# Patient Record
Sex: Female | Born: 1980
Health system: Southern US, Community
[De-identification: ages and names within clinical notes are randomized; demographics above are authoritative.]

## PROBLEM LIST (undated history)

## (undated) DIAGNOSIS — R002 Palpitations: Secondary | ICD-10-CM

## (undated) DIAGNOSIS — IMO0002 Reserved for concepts with insufficient information to code with codable children: Secondary | ICD-10-CM

## (undated) DIAGNOSIS — E039 Hypothyroidism, unspecified: Secondary | ICD-10-CM

## (undated) DIAGNOSIS — D649 Anemia, unspecified: Secondary | ICD-10-CM

## (undated) DIAGNOSIS — J302 Other seasonal allergic rhinitis: Secondary | ICD-10-CM

## (undated) DIAGNOSIS — E079 Disorder of thyroid, unspecified: Secondary | ICD-10-CM

## (undated) DIAGNOSIS — F4310 Post-traumatic stress disorder, unspecified: Secondary | ICD-10-CM

## (undated) DIAGNOSIS — F32A Depression, unspecified: Secondary | ICD-10-CM

## (undated) DIAGNOSIS — T7840XA Allergy, unspecified, initial encounter: Secondary | ICD-10-CM

## (undated) DIAGNOSIS — F319 Bipolar disorder, unspecified: Secondary | ICD-10-CM

## (undated) DIAGNOSIS — R87619 Unspecified abnormal cytological findings in specimens from cervix uteri: Secondary | ICD-10-CM

## (undated) DIAGNOSIS — F329 Major depressive disorder, single episode, unspecified: Secondary | ICD-10-CM

## (undated) HISTORY — DX: Anemia, unspecified: D64.9

## (undated) HISTORY — DX: Allergy, unspecified, initial encounter: T78.40XA

## (undated) HISTORY — DX: Post-traumatic stress disorder, unspecified: F43.10

## (undated) HISTORY — DX: Reserved for concepts with insufficient information to code with codable children: IMO0002

## (undated) HISTORY — DX: Unspecified abnormal cytological findings in specimens from cervix uteri: R87.619

## (undated) HISTORY — DX: Other seasonal allergic rhinitis: J30.2

## (undated) HISTORY — DX: Depression, unspecified: F32.A

## (undated) HISTORY — DX: Bipolar disorder, unspecified: F31.9

## (undated) HISTORY — DX: Major depressive disorder, single episode, unspecified: F32.9

## (undated) HISTORY — DX: Disorder of thyroid, unspecified: E07.9

## (undated) HISTORY — DX: Palpitations: R00.2

---
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
## 2002-01-31 ENCOUNTER — Encounter: Admission: RE | Admit: 2002-01-31 | Discharge: 2002-01-31 | Payer: Self-pay | Admitting: Family Medicine

## 2002-02-07 ENCOUNTER — Encounter: Admission: RE | Admit: 2002-02-07 | Discharge: 2002-02-07 | Payer: Self-pay | Admitting: Family Medicine

## 2002-06-22 ENCOUNTER — Encounter: Admission: RE | Admit: 2002-06-22 | Discharge: 2002-06-22 | Payer: Self-pay | Admitting: Family Medicine

## 2002-07-26 ENCOUNTER — Encounter: Admission: RE | Admit: 2002-07-26 | Discharge: 2002-07-26 | Payer: Self-pay | Admitting: Family Medicine

## 2002-12-04 ENCOUNTER — Encounter: Payer: Self-pay | Admitting: Emergency Medicine

## 2002-12-04 ENCOUNTER — Emergency Department (HOSPITAL_COMMUNITY): Admission: EM | Admit: 2002-12-04 | Discharge: 2002-12-04 | Payer: Self-pay | Admitting: Emergency Medicine

## 2002-12-19 ENCOUNTER — Emergency Department (HOSPITAL_COMMUNITY): Admission: EM | Admit: 2002-12-19 | Discharge: 2002-12-19 | Payer: Self-pay | Admitting: Emergency Medicine

## 2002-12-22 ENCOUNTER — Encounter: Payer: Self-pay | Admitting: Emergency Medicine

## 2002-12-22 ENCOUNTER — Emergency Department (HOSPITAL_COMMUNITY): Admission: EM | Admit: 2002-12-22 | Discharge: 2002-12-22 | Payer: Self-pay | Admitting: Emergency Medicine

## 2002-12-28 ENCOUNTER — Encounter: Admission: RE | Admit: 2002-12-28 | Discharge: 2002-12-28 | Payer: Self-pay | Admitting: Family Medicine

## 2003-01-01 ENCOUNTER — Encounter: Payer: Self-pay | Admitting: Sports Medicine

## 2003-01-01 ENCOUNTER — Encounter: Admission: RE | Admit: 2003-01-01 | Discharge: 2003-01-01 | Payer: Self-pay | Admitting: Sports Medicine

## 2003-02-07 ENCOUNTER — Emergency Department (HOSPITAL_COMMUNITY): Admission: EM | Admit: 2003-02-07 | Discharge: 2003-02-07 | Payer: Self-pay | Admitting: Emergency Medicine

## 2003-02-20 ENCOUNTER — Encounter: Admission: RE | Admit: 2003-02-20 | Discharge: 2003-02-20 | Payer: Self-pay | Admitting: Family Medicine

## 2003-03-07 ENCOUNTER — Encounter (INDEPENDENT_AMBULATORY_CARE_PROVIDER_SITE_OTHER): Payer: Self-pay | Admitting: Specialist

## 2003-03-07 ENCOUNTER — Encounter: Admission: RE | Admit: 2003-03-07 | Discharge: 2003-03-07 | Payer: Self-pay | Admitting: Family Medicine

## 2003-06-26 ENCOUNTER — Encounter: Admission: RE | Admit: 2003-06-26 | Discharge: 2003-06-26 | Payer: Self-pay | Admitting: Family Medicine

## 2003-07-09 ENCOUNTER — Encounter: Admission: RE | Admit: 2003-07-09 | Discharge: 2003-07-09 | Payer: Self-pay | Admitting: Family Medicine

## 2003-08-02 ENCOUNTER — Encounter: Admission: RE | Admit: 2003-08-02 | Discharge: 2003-08-02 | Payer: Self-pay | Admitting: Family Medicine

## 2003-08-16 ENCOUNTER — Encounter: Admission: RE | Admit: 2003-08-16 | Discharge: 2003-08-16 | Payer: Self-pay | Admitting: Family Medicine

## 2003-09-13 ENCOUNTER — Encounter: Admission: RE | Admit: 2003-09-13 | Discharge: 2003-09-13 | Payer: Self-pay | Admitting: Family Medicine

## 2003-11-13 ENCOUNTER — Encounter: Admission: RE | Admit: 2003-11-13 | Discharge: 2003-11-13 | Payer: Self-pay | Admitting: Family Medicine

## 2004-03-24 ENCOUNTER — Ambulatory Visit: Payer: Self-pay | Admitting: Family Medicine

## 2004-04-03 ENCOUNTER — Ambulatory Visit: Payer: Self-pay | Admitting: Family Medicine

## 2004-04-03 ENCOUNTER — Other Ambulatory Visit: Admission: RE | Admit: 2004-04-03 | Discharge: 2004-04-03 | Payer: Self-pay | Admitting: Family Medicine

## 2004-04-24 ENCOUNTER — Ambulatory Visit: Payer: Self-pay | Admitting: Family Medicine

## 2004-05-13 ENCOUNTER — Ambulatory Visit: Payer: Self-pay | Admitting: Family Medicine

## 2004-05-22 ENCOUNTER — Ambulatory Visit: Payer: Self-pay | Admitting: Sports Medicine

## 2004-06-12 ENCOUNTER — Ambulatory Visit: Payer: Self-pay | Admitting: Family Medicine

## 2004-07-09 ENCOUNTER — Ambulatory Visit: Payer: Self-pay | Admitting: Family Medicine

## 2004-08-12 ENCOUNTER — Emergency Department (HOSPITAL_COMMUNITY): Admission: EM | Admit: 2004-08-12 | Discharge: 2004-08-12 | Payer: Self-pay | Admitting: Emergency Medicine

## 2004-09-01 ENCOUNTER — Ambulatory Visit: Payer: Self-pay | Admitting: Family Medicine

## 2004-09-09 HISTORY — PX: DILATION AND CURETTAGE OF UTERUS: SHX78

## 2004-09-10 ENCOUNTER — Ambulatory Visit: Payer: Self-pay | Admitting: Family Medicine

## 2004-09-10 ENCOUNTER — Ambulatory Visit (HOSPITAL_COMMUNITY): Admission: RE | Admit: 2004-09-10 | Discharge: 2004-09-10 | Payer: Self-pay | Admitting: Family Medicine

## 2004-09-21 ENCOUNTER — Ambulatory Visit: Payer: Self-pay | Admitting: Family Medicine

## 2004-10-30 ENCOUNTER — Ambulatory Visit: Payer: Self-pay | Admitting: Family Medicine

## 2004-11-11 ENCOUNTER — Ambulatory Visit: Payer: Self-pay | Admitting: Family Medicine

## 2004-12-11 ENCOUNTER — Ambulatory Visit: Payer: Self-pay | Admitting: Family Medicine

## 2005-02-20 ENCOUNTER — Emergency Department (HOSPITAL_COMMUNITY): Admission: EM | Admit: 2005-02-20 | Discharge: 2005-02-20 | Payer: Self-pay | Admitting: Emergency Medicine

## 2005-02-23 ENCOUNTER — Ambulatory Visit: Payer: Self-pay | Admitting: Family Medicine

## 2005-03-12 ENCOUNTER — Encounter (INDEPENDENT_AMBULATORY_CARE_PROVIDER_SITE_OTHER): Payer: Self-pay | Admitting: Specialist

## 2005-03-12 ENCOUNTER — Ambulatory Visit: Payer: Self-pay | Admitting: Family Medicine

## 2005-04-01 ENCOUNTER — Ambulatory Visit: Payer: Self-pay | Admitting: Family Medicine

## 2005-04-13 ENCOUNTER — Ambulatory Visit: Payer: Self-pay | Admitting: Family Medicine

## 2005-05-17 ENCOUNTER — Emergency Department (HOSPITAL_COMMUNITY): Admission: EM | Admit: 2005-05-17 | Discharge: 2005-05-17 | Payer: Self-pay | Admitting: Family Medicine

## 2005-07-23 ENCOUNTER — Ambulatory Visit: Payer: Self-pay | Admitting: Sports Medicine

## 2005-07-26 ENCOUNTER — Ambulatory Visit (HOSPITAL_COMMUNITY): Admission: RE | Admit: 2005-07-26 | Discharge: 2005-07-26 | Payer: Self-pay | Admitting: Family Medicine

## 2005-08-13 ENCOUNTER — Emergency Department (HOSPITAL_COMMUNITY): Admission: EM | Admit: 2005-08-13 | Discharge: 2005-08-13 | Payer: Self-pay | Admitting: Emergency Medicine

## 2005-09-04 ENCOUNTER — Emergency Department (HOSPITAL_COMMUNITY): Admission: EM | Admit: 2005-09-04 | Discharge: 2005-09-04 | Payer: Self-pay | Admitting: Family Medicine

## 2005-09-15 ENCOUNTER — Ambulatory Visit: Payer: Self-pay | Admitting: Family Medicine

## 2005-12-29 ENCOUNTER — Ambulatory Visit: Payer: Self-pay | Admitting: Family Medicine

## 2006-01-09 ENCOUNTER — Encounter (INDEPENDENT_AMBULATORY_CARE_PROVIDER_SITE_OTHER): Payer: Self-pay | Admitting: *Deleted

## 2006-01-09 LAB — CONVERTED CEMR LAB

## 2006-01-24 ENCOUNTER — Other Ambulatory Visit: Admission: RE | Admit: 2006-01-24 | Discharge: 2006-01-24 | Payer: Self-pay | Admitting: Family Medicine

## 2006-01-24 ENCOUNTER — Encounter (INDEPENDENT_AMBULATORY_CARE_PROVIDER_SITE_OTHER): Payer: Self-pay | Admitting: Specialist

## 2006-01-24 ENCOUNTER — Ambulatory Visit: Payer: Self-pay | Admitting: Family Medicine

## 2006-02-03 ENCOUNTER — Ambulatory Visit: Payer: Self-pay | Admitting: Sports Medicine

## 2006-02-25 ENCOUNTER — Ambulatory Visit: Payer: Self-pay | Admitting: Family Medicine

## 2006-09-08 DIAGNOSIS — L2089 Other atopic dermatitis: Secondary | ICD-10-CM

## 2006-09-08 DIAGNOSIS — J45909 Unspecified asthma, uncomplicated: Secondary | ICD-10-CM

## 2006-09-08 DIAGNOSIS — J309 Allergic rhinitis, unspecified: Secondary | ICD-10-CM | POA: Insufficient documentation

## 2006-09-08 DIAGNOSIS — E669 Obesity, unspecified: Secondary | ICD-10-CM

## 2006-09-09 ENCOUNTER — Encounter (INDEPENDENT_AMBULATORY_CARE_PROVIDER_SITE_OTHER): Payer: Self-pay | Admitting: *Deleted

## 2007-01-16 ENCOUNTER — Telehealth: Payer: Self-pay | Admitting: *Deleted

## 2007-01-17 ENCOUNTER — Telehealth: Payer: Self-pay | Admitting: *Deleted

## 2007-07-13 DIAGNOSIS — E079 Disorder of thyroid, unspecified: Secondary | ICD-10-CM

## 2007-07-13 HISTORY — DX: Disorder of thyroid, unspecified: E07.9

## 2007-08-31 ENCOUNTER — Telehealth: Payer: Self-pay | Admitting: *Deleted

## 2007-09-07 ENCOUNTER — Ambulatory Visit: Payer: Self-pay | Admitting: Family Medicine

## 2007-09-07 LAB — CONVERTED CEMR LAB: Beta hcg, urine, semiquantitative: NEGATIVE

## 2007-10-02 ENCOUNTER — Encounter: Payer: Self-pay | Admitting: *Deleted

## 2007-10-09 ENCOUNTER — Ambulatory Visit: Payer: Self-pay | Admitting: Family Medicine

## 2007-10-30 ENCOUNTER — Telehealth: Payer: Self-pay | Admitting: *Deleted

## 2007-10-30 ENCOUNTER — Emergency Department (HOSPITAL_COMMUNITY): Admission: EM | Admit: 2007-10-30 | Discharge: 2007-10-31 | Payer: Self-pay | Admitting: Emergency Medicine

## 2007-10-31 ENCOUNTER — Encounter: Payer: Self-pay | Admitting: *Deleted

## 2007-11-23 ENCOUNTER — Ambulatory Visit: Payer: Self-pay | Admitting: Family Medicine

## 2007-12-06 ENCOUNTER — Telehealth: Payer: Self-pay | Admitting: *Deleted

## 2007-12-07 ENCOUNTER — Ambulatory Visit: Payer: Self-pay | Admitting: Family Medicine

## 2007-12-07 LAB — CONVERTED CEMR LAB
Blood in Urine, dipstick: NEGATIVE
Ketones, urine, test strip: NEGATIVE
Urobilinogen, UA: 0.2
WBC Urine, dipstick: NEGATIVE

## 2007-12-21 ENCOUNTER — Telehealth (INDEPENDENT_AMBULATORY_CARE_PROVIDER_SITE_OTHER): Payer: Self-pay | Admitting: Family Medicine

## 2008-02-22 ENCOUNTER — Encounter (INDEPENDENT_AMBULATORY_CARE_PROVIDER_SITE_OTHER): Payer: Self-pay | Admitting: Family Medicine

## 2008-02-23 ENCOUNTER — Encounter: Payer: Self-pay | Admitting: *Deleted

## 2008-02-25 ENCOUNTER — Emergency Department (HOSPITAL_COMMUNITY): Admission: EM | Admit: 2008-02-25 | Discharge: 2008-02-25 | Payer: Self-pay | Admitting: Emergency Medicine

## 2008-02-26 ENCOUNTER — Telehealth: Payer: Self-pay | Admitting: *Deleted

## 2008-03-01 ENCOUNTER — Emergency Department (HOSPITAL_COMMUNITY): Admission: EM | Admit: 2008-03-01 | Discharge: 2008-03-01 | Payer: Self-pay | Admitting: Emergency Medicine

## 2008-03-03 ENCOUNTER — Telehealth: Payer: Self-pay | Admitting: Family Medicine

## 2008-03-07 ENCOUNTER — Encounter (INDEPENDENT_AMBULATORY_CARE_PROVIDER_SITE_OTHER): Payer: Self-pay | Admitting: Family Medicine

## 2008-03-12 ENCOUNTER — Encounter (INDEPENDENT_AMBULATORY_CARE_PROVIDER_SITE_OTHER): Payer: Self-pay | Admitting: Family Medicine

## 2008-03-12 ENCOUNTER — Ambulatory Visit: Payer: Self-pay | Admitting: Family Medicine

## 2008-03-12 DIAGNOSIS — R002 Palpitations: Secondary | ICD-10-CM | POA: Insufficient documentation

## 2008-03-12 DIAGNOSIS — E0590 Thyrotoxicosis, unspecified without thyrotoxic crisis or storm: Secondary | ICD-10-CM | POA: Insufficient documentation

## 2008-03-12 HISTORY — DX: Palpitations: R00.2

## 2008-03-13 ENCOUNTER — Telehealth (INDEPENDENT_AMBULATORY_CARE_PROVIDER_SITE_OTHER): Payer: Self-pay | Admitting: Family Medicine

## 2008-03-13 ENCOUNTER — Encounter (INDEPENDENT_AMBULATORY_CARE_PROVIDER_SITE_OTHER): Payer: Self-pay | Admitting: Family Medicine

## 2008-03-13 LAB — CONVERTED CEMR LAB
GC Probe Amp, Genital: NEGATIVE
Hepatitis B Surface Ag: NEGATIVE

## 2008-03-14 ENCOUNTER — Emergency Department (HOSPITAL_COMMUNITY): Admission: EM | Admit: 2008-03-14 | Discharge: 2008-03-15 | Payer: Self-pay | Admitting: Emergency Medicine

## 2008-03-15 ENCOUNTER — Encounter (INDEPENDENT_AMBULATORY_CARE_PROVIDER_SITE_OTHER): Payer: Self-pay | Admitting: Family Medicine

## 2008-03-19 ENCOUNTER — Ambulatory Visit: Payer: Self-pay | Admitting: Family Medicine

## 2008-03-20 ENCOUNTER — Encounter (INDEPENDENT_AMBULATORY_CARE_PROVIDER_SITE_OTHER): Payer: Self-pay | Admitting: Family Medicine

## 2008-03-21 ENCOUNTER — Encounter: Payer: Self-pay | Admitting: *Deleted

## 2008-03-25 ENCOUNTER — Encounter: Payer: Self-pay | Admitting: *Deleted

## 2008-03-27 ENCOUNTER — Ambulatory Visit: Payer: Self-pay | Admitting: Family Medicine

## 2008-03-28 ENCOUNTER — Encounter (HOSPITAL_COMMUNITY): Admission: RE | Admit: 2008-03-28 | Discharge: 2008-06-19 | Payer: Self-pay | Admitting: Family Medicine

## 2008-04-01 ENCOUNTER — Telehealth (INDEPENDENT_AMBULATORY_CARE_PROVIDER_SITE_OTHER): Payer: Self-pay | Admitting: Family Medicine

## 2008-04-04 ENCOUNTER — Telehealth (INDEPENDENT_AMBULATORY_CARE_PROVIDER_SITE_OTHER): Payer: Self-pay | Admitting: Family Medicine

## 2008-04-05 ENCOUNTER — Telehealth: Payer: Self-pay | Admitting: *Deleted

## 2008-04-06 ENCOUNTER — Telehealth (INDEPENDENT_AMBULATORY_CARE_PROVIDER_SITE_OTHER): Payer: Self-pay | Admitting: Family Medicine

## 2008-04-06 ENCOUNTER — Encounter: Payer: Self-pay | Admitting: Family Medicine

## 2008-04-06 ENCOUNTER — Inpatient Hospital Stay (HOSPITAL_COMMUNITY): Admission: EM | Admit: 2008-04-06 | Discharge: 2008-04-07 | Payer: Self-pay | Admitting: Emergency Medicine

## 2008-04-09 ENCOUNTER — Telehealth: Payer: Self-pay | Admitting: *Deleted

## 2008-04-15 ENCOUNTER — Encounter: Payer: Self-pay | Admitting: *Deleted

## 2008-04-29 ENCOUNTER — Telehealth (INDEPENDENT_AMBULATORY_CARE_PROVIDER_SITE_OTHER): Payer: Self-pay | Admitting: *Deleted

## 2008-05-02 ENCOUNTER — Ambulatory Visit: Payer: Self-pay | Admitting: Family Medicine

## 2008-05-02 DIAGNOSIS — T783XXA Angioneurotic edema, initial encounter: Secondary | ICD-10-CM | POA: Insufficient documentation

## 2008-05-20 ENCOUNTER — Telehealth (INDEPENDENT_AMBULATORY_CARE_PROVIDER_SITE_OTHER): Payer: Self-pay | Admitting: *Deleted

## 2008-05-23 ENCOUNTER — Emergency Department (HOSPITAL_COMMUNITY): Admission: EM | Admit: 2008-05-23 | Discharge: 2008-05-23 | Payer: Self-pay | Admitting: Emergency Medicine

## 2008-05-28 ENCOUNTER — Telehealth (INDEPENDENT_AMBULATORY_CARE_PROVIDER_SITE_OTHER): Payer: Self-pay | Admitting: Family Medicine

## 2008-06-21 ENCOUNTER — Encounter (INDEPENDENT_AMBULATORY_CARE_PROVIDER_SITE_OTHER): Payer: Self-pay | Admitting: Family Medicine

## 2008-06-25 ENCOUNTER — Encounter (INDEPENDENT_AMBULATORY_CARE_PROVIDER_SITE_OTHER): Payer: Self-pay | Admitting: *Deleted

## 2008-06-25 ENCOUNTER — Ambulatory Visit: Payer: Self-pay | Admitting: Family Medicine

## 2008-06-25 ENCOUNTER — Encounter (INDEPENDENT_AMBULATORY_CARE_PROVIDER_SITE_OTHER): Payer: Self-pay | Admitting: Family Medicine

## 2008-06-26 LAB — CONVERTED CEMR LAB
T3, Free: 7.3 pg/mL — ABNORMAL HIGH (ref 2.3–4.2)
TSH: <0.004 microintl units/mL — ABNORMAL LOW (ref 0.350–4.50)

## 2008-06-27 ENCOUNTER — Ambulatory Visit: Payer: Self-pay | Admitting: Family Medicine

## 2008-07-09 ENCOUNTER — Ambulatory Visit: Payer: Self-pay | Admitting: Family Medicine

## 2008-07-30 ENCOUNTER — Ambulatory Visit: Payer: Self-pay | Admitting: Family Medicine

## 2008-07-30 ENCOUNTER — Encounter (INDEPENDENT_AMBULATORY_CARE_PROVIDER_SITE_OTHER): Payer: Self-pay | Admitting: Family Medicine

## 2008-08-17 ENCOUNTER — Emergency Department (HOSPITAL_COMMUNITY): Admission: EM | Admit: 2008-08-17 | Discharge: 2008-08-17 | Payer: Self-pay | Admitting: Emergency Medicine

## 2008-09-11 ENCOUNTER — Telehealth (INDEPENDENT_AMBULATORY_CARE_PROVIDER_SITE_OTHER): Payer: Self-pay | Admitting: Family Medicine

## 2008-09-20 ENCOUNTER — Ambulatory Visit: Payer: Self-pay | Admitting: Family Medicine

## 2008-09-20 ENCOUNTER — Encounter (INDEPENDENT_AMBULATORY_CARE_PROVIDER_SITE_OTHER): Payer: Self-pay | Admitting: Family Medicine

## 2008-09-23 ENCOUNTER — Telehealth (INDEPENDENT_AMBULATORY_CARE_PROVIDER_SITE_OTHER): Payer: Self-pay | Admitting: Family Medicine

## 2008-09-23 LAB — CONVERTED CEMR LAB
Free T4: 1.35 ng/dL (ref 0.89–1.80)
T3, Free: 4.7 pg/mL — ABNORMAL HIGH (ref 2.3–4.2)

## 2008-11-23 ENCOUNTER — Emergency Department (HOSPITAL_COMMUNITY): Admission: EM | Admit: 2008-11-23 | Discharge: 2008-11-23 | Payer: Self-pay | Admitting: Family Medicine

## 2008-12-02 ENCOUNTER — Encounter (INDEPENDENT_AMBULATORY_CARE_PROVIDER_SITE_OTHER): Payer: Self-pay | Admitting: Family Medicine

## 2008-12-30 ENCOUNTER — Ambulatory Visit: Payer: Self-pay | Admitting: Family Medicine

## 2008-12-30 LAB — CONVERTED CEMR LAB: Beta hcg, urine, semiquantitative: NEGATIVE

## 2009-01-01 ENCOUNTER — Telehealth: Payer: Self-pay | Admitting: Sports Medicine

## 2009-01-02 ENCOUNTER — Ambulatory Visit: Payer: Self-pay | Admitting: Family Medicine

## 2009-01-02 ENCOUNTER — Encounter: Payer: Self-pay | Admitting: Sports Medicine

## 2009-01-02 LAB — CONVERTED CEMR LAB
Bilirubin Urine: NEGATIVE
Glucose, Urine, Semiquant: NEGATIVE
Ketones, urine, test strip: NEGATIVE
Nitrite: NEGATIVE
Protein, U semiquant: NEGATIVE
Specific Gravity, Urine: 1.02
Urobilinogen, UA: 0.2
pH: 5.5

## 2009-01-03 ENCOUNTER — Encounter: Payer: Self-pay | Admitting: Sports Medicine

## 2009-01-05 ENCOUNTER — Telehealth: Payer: Self-pay | Admitting: Family Medicine

## 2009-01-05 ENCOUNTER — Telehealth: Payer: Self-pay | Admitting: Sports Medicine

## 2009-01-17 ENCOUNTER — Ambulatory Visit: Payer: Self-pay | Admitting: Family Medicine

## 2009-01-17 ENCOUNTER — Encounter: Payer: Self-pay | Admitting: Family Medicine

## 2009-01-17 ENCOUNTER — Telehealth: Payer: Self-pay | Admitting: Sports Medicine

## 2009-01-17 LAB — CONVERTED CEMR LAB
Beta hcg, urine, semiquantitative: NEGATIVE
Bilirubin Urine: NEGATIVE
Blood in Urine, dipstick: NEGATIVE
Ketones, urine, test strip: NEGATIVE
WBC Urine, dipstick: NEGATIVE
pH: 8

## 2009-01-20 LAB — CONVERTED CEMR LAB
Chlamydia, DNA Probe: NEGATIVE
Free T4: 0.95 ng/dL (ref 0.80–1.80)
T3, Free: 3.2 pg/mL (ref 2.3–4.2)

## 2009-07-29 ENCOUNTER — Encounter (INDEPENDENT_AMBULATORY_CARE_PROVIDER_SITE_OTHER): Payer: Self-pay | Admitting: *Deleted

## 2009-11-26 ENCOUNTER — Emergency Department (HOSPITAL_COMMUNITY): Admission: EM | Admit: 2009-11-26 | Discharge: 2009-11-26 | Payer: Self-pay | Admitting: Emergency Medicine

## 2010-02-03 ENCOUNTER — Encounter (HOSPITAL_COMMUNITY): Admission: RE | Admit: 2010-02-03 | Discharge: 2010-04-01 | Payer: Self-pay | Admitting: Endocrinology

## 2010-03-06 ENCOUNTER — Ambulatory Visit (HOSPITAL_COMMUNITY): Admission: RE | Admit: 2010-03-06 | Discharge: 2010-03-06 | Payer: Self-pay | Admitting: Endocrinology

## 2010-05-04 ENCOUNTER — Encounter: Payer: Self-pay | Admitting: Sports Medicine

## 2010-05-18 ENCOUNTER — Encounter: Payer: Self-pay | Admitting: Sports Medicine

## 2010-06-12 ENCOUNTER — Emergency Department (HOSPITAL_COMMUNITY)
Admission: EM | Admit: 2010-06-12 | Discharge: 2010-06-13 | Payer: Self-pay | Source: Home / Self Care | Admitting: Emergency Medicine

## 2010-06-23 ENCOUNTER — Emergency Department (HOSPITAL_COMMUNITY)
Admission: EM | Admit: 2010-06-23 | Discharge: 2010-06-23 | Payer: Self-pay | Source: Home / Self Care | Admitting: Emergency Medicine

## 2010-08-11 NOTE — Progress Notes (Signed)
 Summary: Triage  Phone Note Call from Patient Call back at Work Phone [phone]   Caller: Patient Summary of Call: pt has uti becuase she can't get off work can she be seen tomorrow. Initial call taken by: Madelin Daring,  January 01, 2009 8:52 AM  Follow-up for Phone Call        appt made for tomorrow in workin. to drinkk plenty of water & take tylenol . she agreed with plan Follow-up by: Ginnie Mau RN,  January 01, 2009 8:57 AM  Additional Follow-up for Phone Call Additional follow up Details #1::        Have her come see me at 3:30 tomorrow as I have clinic, ok to double book.  Make sure she has a Urinalysis and Urine culture done when she gets here so that can be running while she's waiting for me to see her.  I will put the orders into the computer. Additional Follow-up by: Debby Petties MD,  January 01, 2009 11:29 AM  New Problems: DYSURIA (ICD-788.1)   New Problems: DYSURIA (ICD-788.1)

## 2010-08-11 NOTE — Progress Notes (Signed)
 Summary: request/ts  Phone Note Call from Patient Call back at Home Phone [phone]   Caller: Patient Summary of Call: pt needs dr note for work that she was seen today.     Initial call taken by: Madelin Daring,  January 17, 2009 1:59 PM  Follow-up for Phone Call        CALLED PT. NOTE UP FRONT. Follow-up by: Jack Bloodgood CMA,,  January 17, 2009 4:23 PM  Additional Follow-up for Phone Call Additional follow up Details #1::        pt request for PCP to write a letter stating, when she was diagnosed with Graves Disease. fwd. to Dr.Navarre Diana Additional Follow-up by: Jack Bloodgood CMA,,  January 17, 2009 4:27 PM    Additional Follow-up for Phone Call Additional follow up Details #2::    Was Dx 03/13/08 Follow-up by: Debby Petties MD,  January 17, 2009 5:38 PM    Appended Document: request/ts CALLED PT AND LMAM TO PICK UP STATEMENT.

## 2010-08-11 NOTE — Miscellaneous (Signed)
   Clinical Lists Changes  Problems: Changed problem from ASTHMA, UNSPECIFIED (ICD-493.90) to ASTHMA, INTERMITTENT (ICD-493.90) 

## 2010-08-11 NOTE — Letter (Signed)
 Summary: Generic Letter  [facility]  [address]   [place], [place] [address]   Phone: [phone]  Fax: [phone]    01/02/2009  Colleen Peck [address] [place], [place]  [address]  To whom it may concern,  Colleen Peck was seen in my office for a medical complaint today 01/02/09.  Please excuse her for any absenses she may have acquired.  Thank you and feel free to contact my office with any questions.     Sincerely,    Debby Petties MD

## 2010-08-11 NOTE — Progress Notes (Signed)
  Phone Note Outgoing Call Call back at [facility] Phone [phone]   Call placed by: Debby Petties MD,  January 05, 2009 7:03 PM Call placed to: Patient Action Taken: Information Sent Reason for Call: Discuss lab or test results Summary of Call: Pt seen for UTI 6/24, Rx Keflex , UCx subsequently grew out ACINETOBACTER CALCOACETICUS-BAUMANNII COMPLEX (see Urine Culture result in chart) resistant to Keflex , called pt and left message that she needs to pick up new antibiotic from [facility] pharmacy.  Cipro  500 by mouth two times a day x7d.     New/Updated Medications: CIPROFLOXACIN  HCL 500 MG TABS (CIPROFLOXACIN  HCL) One tab by mouth two times a day x7 days   Prescriptions: CIPROFLOXACIN  HCL 500 MG TABS (CIPROFLOXACIN  HCL) One tab by mouth two times a day x7 days  #14 x 0   Entered and Authorized by:   Debby Petties MD   Signed by:   Debby Petties MD on 01/05/2009   Method used:   Electronically to        [facility] [phone]* (retail)       [address]       [place], [place]  [address]       Ph: [phone]       Fax: [phone]   RxID:   [id number]

## 2010-08-11 NOTE — Miscellaneous (Signed)
Summary: MMR  Clinical Lists Changes states she needs proof of 2nd mmr. we do not have it in our records. she does not remember having it as a kid. told her we usually draw blood to check to see if immune. states her insurance will not pay for labs that are sent out-only if done here. says if it is cheaper, she wants the 2nd mmr vaccine. to [name] to answer cost question.Golden Circle RN  July 29, 2009 3:39 PM  Told pt cost of treatment costs and suggested [facility], stated she had already talked to them and would go there since it is free.Gladstone Pih  July 31, 2009 3:18 PM
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
## 2010-08-11 NOTE — Miscellaneous (Signed)
  Clinical Lists Changes  Problems: Removed problem of CONTACT OR EXPOSURE TO OTHER VIRAL DISEASES (ICD-V01.79) Removed problem of ORAL CONTRACEPTION (ICD-V25.41) Removed problem of NONSPEC REACT TUBERCULIN SKN TEST W/O ACTV TB (ICD-795.5) Removed problem of EXOPHTHALMOS (ICD-376.30) Removed problem of PRURITUS (ICD-698.9)

## 2010-09-18 ENCOUNTER — Inpatient Hospital Stay (INDEPENDENT_AMBULATORY_CARE_PROVIDER_SITE_OTHER)
Admission: RE | Admit: 2010-09-18 | Discharge: 2010-09-18 | Disposition: A | Discharge status: Home / Self Care | Payer: PRIVATE HEALTH INSURANCE | Source: Ambulatory Visit | Attending: Family Medicine | Admitting: Family Medicine

## 2010-09-18 DIAGNOSIS — K047 Periapical abscess without sinus: Secondary | ICD-10-CM

## 2010-10-14 ENCOUNTER — Other Ambulatory Visit: Payer: Self-pay | Admitting: Obstetrics & Gynecology

## 2010-11-24 NOTE — Discharge Summary (Signed)
Colleen Peck, Colleen Peck             ACCOUNT NO.:  [id number]   MEDICAL RECORD NO.:  [id number]          PATIENT TYPE:  INP   LOCATION:  4714                         FACILITY:  [facility]   PHYSICIAN:  [name], M.D.DATE OF BIRTH:  05/24/1981   DATE OF ADMISSION:  04/06/2008  DATE OF DISCHARGE:  04/07/2008                               DISCHARGE SUMMARY   DISCHARGE DIAGNOSES:  1. Shortness of breath.  2. Hypoxia.  3. Asthma.  4. Graves disease.   CONSULTS:  None.   PROCEDURE AND STUDY:  1. CT angiogram, impression; no gross central pulmonary emboli      identified.  No significant abnormalities.  Questionable residual      sinus.  2. Chest x-ray, April 06, 2008, impression; normal chest.   DISCHARGE LABORATORY DATA:  D-dimer 0.79.  Cardiac enzymes negative x1.  Hemoglobin 12.0 and hematocrit 36.4.   BRIEF HISTORY AND PHYSICAL:  A 30 year old female recently diagnosed  with Graves disease presents with a 1-week history of URI symptoms and  progressive shortness of breath.   HOSPITAL COURSE:  1. Shortness of breath.  The patient presented with increased work of      breathing without wheeze after being seen by Urgent Care.  The      patient was found to be saturating greater than 94%, however, ABG      did show an oxygenation of 69.  It was thought that because the      patient's saturations were able to be maintained even with low      oxygen on ABG that this must be a chronic problem for the patient.      The patient was admitted for shortness of breath and placed on      telemetry.  The patient has a history of asthma; however, there was      no wheezing on exam.  The patient was given albuterol nebulizers      for symptomatic relief from increased work of breathing, which did      give some relief to the patient.  Shortness of breath could      possible be from 1-week history of viral URI versus reactive airway      disease. The patient had positive D-dimer and CT  angiogram ruled      out evidence of pulmonary embolism as cause of acute shortness of      breath.  There may have been some component of anxiety to shortness      of breath as well as the patient recently diagnosed with Graves      disease and has been dealing with sequelae of disease including      urticaria.  The patient's shortness of breath improved prior to      discharge, was maintaining saturations greater than 93% on room air      with normal work of breathing.   1. Asthma.  The patient has a history of asthma and was given      albuterol nebulizer treatment as stated above.  The patient was      discharged home on  albuterol nebulizer q.4 h p.r.n. as well as      albuterol inhaler if nebulizers were not used.   1. Graves disease.  The patient was recently diagnosed with Graves      disease and was continued on propranolol 40 mg daily.  However,      prior to discharge, propranolol dose was found to be inadequate and      was increased to 40 mg t.i.d.  The patient was also started on      methimazole 10 mg b.i.d. as the patient had not started as an      outpatient prior to admission to hospital.  The patient will follow      up PCP regarding Graves disease.  It is of note that the patient      has had a history of urticaria/hives with new onset of Graves      disease.  She was previously on Vistaril 25 mg 1-2 tablets p.o.      t.i.d. as needed for itching.  The patient did have episode of      urticaria during admission and was given Vistaril for symptomatic      relief.   FOLLOWUP:  The patient is to follow up with Dr. [name] at [facility]   ISSUES FOR FOLLOWUP:  None.   DISCHARGE CONDITION:  Stable/improved.   DISCHARGE MEDICATIONS:  1. Albuterol 2.5 mg nebulizers q.4 h p.r.n. shortness of breath.  2. Propranolol 40 mg 1 tablet p.o. t.i.d.  3. Vistaril 25 mg 1-2 tablets p.o. t.i.d. p.r.n. pruritus.  4. Methimazole 10 mg 1 tablet p.o.  b.i.d.   The patient is instructed to return for evaluation if she has increased  work of breathing or chest pain.      Milinda Antis, MD  Electronically Signed      Santiago Bumpers. Leveda Anna, M.D.  Electronically Signed    KD/MEDQ  D:  04/07/2008  T:  04/08/2008  Job:  [id number]   cc:   [name], M.D.

## 2010-11-24 NOTE — H&P (Signed)
Colleen Peck, Colleen Peck             ACCOUNT NO.:  [id number]   MEDICAL RECORD NO.:  [id number]          PATIENT TYPE:  INP   LOCATION:  4714                         FACILITY:  [facility]   PHYSICIAN:  [name], M.D.DATE OF BIRTH:  07/01/1981   DATE OF ADMISSION:  04/06/2008  DATE OF DISCHARGE:                              HISTORY & PHYSICAL   CHIEF COMPLAINT:  Shortness of breath.   PRIMARY CARE PHYSICIAN:  [name], MD at the [facility].   HISTORY OF PRESENT ILLNESS:  A 30 year old female newly diagnosed with  Graves disease presents to ED with 1 week history of URI symptoms.  The  patient has had rhinorrhea x1 week, which progressed with productive  cough, nasal congestion, and shortness of breath.  Denies fever.  Last  night shortness of breath worsened despite home nebulizer treatment.  Tried OTC Tamiflu for symptoms as well with no relief.  Has had history  of asthma; however, per the patient did not feel like this was a normal  asthma attack.  Was evaluated by Urgent Care and told to come the ED for  admission for tachypnea and tachycardia.  Upon ED arrival, found to be  tachycardic and tachypneic, given albuterol/Atrovent nebulizers x2,  given 1 dose of Solu-Medrol.  Of note, the patient recently diagnosed  with Graves, has not started methimazole yet.   REVIEW OF SYSTEMS:  Denies chest pain, abdominal pain.  Admits to  posttussive emesis, 20-pound weight loss over the past 2-3 months.  Positive sick contact.  Denies immobilization.  Denies OCP.  No family  history of blood clots.   PAST MEDICAL HISTORY:  1. Hyperthyroidism/Graves disease.  2. Asthma.  3. Eczema.  4. Obesity.  5. Allergic rhinitis.   FAMILY HISTORY:  Aunt with breast cancer in late 52s.  Maternal  grandfather with a history of liver cancer, hypertension, and diabetes  in the family.  Mother has asthma.  Extended family with thyroid  problems.   SOCIAL HISTORY:  The  patient is a Theatre stage manager at [employer].  Quit tobacco 3 months ago.  At that time, also quit  marijuana.  Occasional EtOH.  Denies any other illicit drugs.  Sexually  active.  Lives with mother.   MEDICATIONS:  1. Propranolol 40 mg p.o. daily.  2. Albuterol nebulizers q.4 h. P.r.n.  3. Vistaril 25 mg p.o. q.6 h. p.r.n. pruritus.  4. Methimazole 10 mg p.o. b.i.d.   CURRENT ALLERGIES:  PENICILLIN questionable allergy, which may cause  rash.   PHYSICAL EXAMINATION:  VITAL SIGNS:  Temperature 97.7, pulse 133, blood  pressure 116/71, respiratory rate 24, O2 sat 94% on room air.  GENERAL:  Well developed, well nourished in no acute distress.  Alert  and oriented x3.  Able to speak in full sentences.  HEENT:  PERRL.  Extraocular muscles intact.  Vision grossly normal with  glasses, proptosis.  Moist mucous membranes.  No thyromegaly.  No  thyroid nodules.  CVS:  Regular rate and rhythm.  No murmurs, rubs, or gallops.  LUNGS:  Increased work of breathing, quick  shallow breaths.  Chest  expands symmetrically.  Lungs are clear to auscultation.  No wheezes or  rales.  ABDOMEN:  Positive bowel sounds.  Abdomen is soft and nontender without  masses or organomegaly.  EXTREMITIES:  Pulses 2+, no cyanosis or edema.  NEUROLOGIC:  No cranial nerve deficits.  Sensory and motor grossly  intact.   LABORATORY DATA:  I-STAT BMET, sodium 138, potassium 4.6, chloride 103,  CO2 of 30, BUN less than 3, creatinine 0.6, glucose 103.  CBC,  hemoglobin 12.0, white blood cell count 7.8, hematocrit 36.4, platelets  375.  Cardiac enzymes negative x1.  D-dimer elevated at 0.79.  BNP 58.  ABG, pH is 7.44, pO2 is 69, pCO2 is 37.9, bicarb of 27.   IMAGES:  Chest x-ray, no acute pathology.  CT angiogram, no evidence of  central PE.   IMPRESSION AND RECOMMENDATIONS:  A 30 year old female with history of  Graves disease admitted with shortness of breath and hypoxia.  1. Dyspnea/shortness of  breath.  The patient with acute onset of      dyspnea and decreased oxygen on ABG.  We will admit to telemetry to      monitor.  Differentials include asthma exacerbation, viral upper      respiratory illness, deep venous thrombosis/pulmonary embolism,      anxiety, or sequela of Graves disease.  The patient has had history      of asthma, however, clinical exam does not suggest asthma      exacerbations.  We will continue nebulizer treatments if they      provide symptomatic relief.  If the patient decompensates or begins      to wheeze, we will start corticosteroids and antibiotics as needed.      Viral upper respiratory illness.  The patient afebrile without      leukocytosis, recent upper respiratory illness, dyspnea could be      related to viral process.  Pulmonary embolism.  Positive D-dimer,      however, CT angiogram does not show acute pulmonary embolism.  No      further anticoagulation or workup needed for pulmonary embolism.      We will give supplemental oxygen overnight and as needed.  Repeat      CBC, BMET in a.m.  We will give Mucinex for cough.  2. Tachycardia.  The patient's tachycardia possibly due to sequela of      Graves disease, anxiety with shortness of breath, or from repeated      nebulizer treatments.  We will continue to monitor.  3. Hyperthyroidism, newly diagnosed Graves disease.  We will continue      home medications.  Start methimazole during inpatient stay.  We      will give Vistaril for pruritus p.r.n.  4. Asthma.  We will continue albuterol and Atrovent nebulizer      treatments.  5. FEN/GI.  Regular diet.  Hep-Lock IV fluids.  6.Deep venous thrombosis prophylaxis.  Lovenox 40 mg subcu.  Dispo: pending improvement in respiratory status      Milinda Antis, MD  Electronically Signed      Santiago Bumpers. Leveda Anna, M.D.  Electronically Signed    KD/MEDQ  D:  04/07/2008  T:  04/07/2008  Job:  [id number]

## 2011-04-06 ENCOUNTER — Emergency Department (HOSPITAL_COMMUNITY)
Admission: EM | Admit: 2011-04-06 | Discharge: 2011-04-06 | Disposition: A | Discharge status: Home / Self Care | Payer: PRIVATE HEALTH INSURANCE | Attending: Emergency Medicine | Admitting: Emergency Medicine

## 2011-04-06 LAB — CBC
Hemoglobin: 10.5 — ABNORMAL LOW
MCHC: 33.1
RBC: 4.07
WBC: 5.5

## 2011-04-06 LAB — DIFFERENTIAL
Basophils Relative: 1
Lymphocytes Relative: 39
Lymphs Abs: 2.1
Monocytes Absolute: 0.6
Monocytes Relative: 11
Neutro Abs: 2.5
Neutrophils Relative %: 45

## 2011-04-06 LAB — CARBOXYHEMOGLOBIN
Carboxyhemoglobin: 0.7
Methemoglobin: 0.8
O2 Saturation: 45.4

## 2011-04-07 ENCOUNTER — Emergency Department (HOSPITAL_COMMUNITY)
Admission: EM | Admit: 2011-04-07 | Discharge: 2011-04-08 | Disposition: A | Discharge status: Home / Self Care | Payer: Self-pay | Attending: Emergency Medicine | Admitting: Emergency Medicine

## 2011-04-07 DIAGNOSIS — R5383 Other fatigue: Secondary | ICD-10-CM | POA: Insufficient documentation

## 2011-04-07 DIAGNOSIS — J3489 Other specified disorders of nose and nasal sinuses: Secondary | ICD-10-CM | POA: Insufficient documentation

## 2011-04-07 DIAGNOSIS — R630 Anorexia: Secondary | ICD-10-CM | POA: Insufficient documentation

## 2011-04-07 DIAGNOSIS — R05 Cough: Secondary | ICD-10-CM | POA: Insufficient documentation

## 2011-04-07 DIAGNOSIS — E039 Hypothyroidism, unspecified: Secondary | ICD-10-CM | POA: Insufficient documentation

## 2011-04-07 DIAGNOSIS — Z79899 Other long term (current) drug therapy: Secondary | ICD-10-CM | POA: Insufficient documentation

## 2011-04-07 DIAGNOSIS — R5381 Other malaise: Secondary | ICD-10-CM | POA: Insufficient documentation

## 2011-04-07 DIAGNOSIS — R059 Cough, unspecified: Secondary | ICD-10-CM | POA: Insufficient documentation

## 2011-04-07 DIAGNOSIS — E0500 Thyrotoxicosis with diffuse goiter without thyrotoxic crisis or storm: Secondary | ICD-10-CM | POA: Insufficient documentation

## 2011-04-07 DIAGNOSIS — R61 Generalized hyperhidrosis: Secondary | ICD-10-CM | POA: Insufficient documentation

## 2011-04-07 DIAGNOSIS — G4700 Insomnia, unspecified: Secondary | ICD-10-CM | POA: Insufficient documentation

## 2011-04-08 ENCOUNTER — Emergency Department (HOSPITAL_COMMUNITY): Payer: Self-pay

## 2011-04-08 ENCOUNTER — Emergency Department (HOSPITAL_COMMUNITY)
Admission: EM | Admit: 2011-04-08 | Discharge: 2011-04-08 | Disposition: A | Discharge status: Home / Self Care | Payer: Self-pay | Attending: Emergency Medicine | Admitting: Emergency Medicine

## 2011-04-08 DIAGNOSIS — R059 Cough, unspecified: Secondary | ICD-10-CM | POA: Insufficient documentation

## 2011-04-08 DIAGNOSIS — R443 Hallucinations, unspecified: Secondary | ICD-10-CM | POA: Insufficient documentation

## 2011-04-08 DIAGNOSIS — E039 Hypothyroidism, unspecified: Secondary | ICD-10-CM | POA: Insufficient documentation

## 2011-04-08 DIAGNOSIS — R05 Cough: Secondary | ICD-10-CM | POA: Insufficient documentation

## 2011-04-08 DIAGNOSIS — R0602 Shortness of breath: Secondary | ICD-10-CM | POA: Insufficient documentation

## 2011-04-08 LAB — COMPREHENSIVE METABOLIC PANEL
Alkaline Phosphatase: 54 U/L (ref 39–117)
BUN: 8 mg/dL (ref 6–23)
CO2: 28 mEq/L (ref 19–32)
Chloride: 98 mEq/L (ref 96–112)
Creatinine, Ser: 0.79 mg/dL (ref 0.50–1.10)
GFR calc Af Amer: >60 mL/min (ref >60)
GFR calc non Af Amer: >60 mL/min (ref >60)
Glucose, Bld: 85 mg/dL (ref 70–99)
Potassium: 3.6 mEq/L (ref 3.5–5.1)
Total Bilirubin: 0.3 mg/dL (ref 0.3–1.2)

## 2011-04-08 LAB — RAPID URINE DRUG SCREEN, HOSP PERFORMED
Amphetamines: NOT DETECTED
Opiates: NOT DETECTED

## 2011-04-08 LAB — POCT I-STAT, CHEM 8
BUN: 7 mg/dL (ref 6–23)
Calcium, Ion: 1.21 mmol/L (ref 1.12–1.32)
Chloride: 99 mEq/L (ref 96–112)
Creatinine, Ser: 1 mg/dL (ref 0.50–1.10)
Sodium: 138 mEq/L (ref 135–145)
TCO2: 27 mmol/L (ref 0–100)

## 2011-04-08 LAB — CBC
HCT: 32.2 % — ABNORMAL LOW (ref 36.0–46.0)
Hemoglobin: 10.8 g/dL — ABNORMAL LOW (ref 12.0–15.0)
MCV: 86.8 fL (ref 78.0–100.0)
WBC: 6.2 10*3/uL (ref 4.0–10.5)

## 2011-04-08 LAB — TSH
TSH: 44.561 u[IU]/mL — ABNORMAL HIGH (ref 0.350–4.500)
TSH: 35.552 u[IU]/mL — ABNORMAL HIGH (ref 0.350–4.500)

## 2011-04-08 LAB — DIFFERENTIAL
Basophils Absolute: 0 10*3/uL (ref 0.0–0.1)
Lymphocytes Relative: 32 % (ref 12–46)
Lymphs Abs: 2 10*3/uL (ref 0.7–4.0)
Neutro Abs: 3.6 10*3/uL (ref 1.7–7.7)

## 2011-04-08 LAB — ETHANOL: Alcohol, Ethyl (B): <11 mg/dL (ref 0–11)

## 2011-04-08 LAB — POCT PREGNANCY, URINE: Preg Test, Ur: NEGATIVE

## 2011-04-08 LAB — T3, FREE: T3, Free: 1.6 pg/mL — ABNORMAL LOW (ref 2.3–4.2)

## 2011-04-08 LAB — T4, FREE: Free T4: 0.74 ng/dL — ABNORMAL LOW (ref 0.80–1.80)

## 2011-04-12 LAB — CK TOTAL AND CKMB (NOT AT ARMC)
CK, MB: 0.9
Relative Index: INVALID
Total CK: 91

## 2011-04-12 LAB — POCT I-STAT, CHEM 8
BUN: <3 — ABNORMAL LOW
Calcium, Ion: 1.27
Creatinine, Ser: 0.6
TCO2: 30

## 2011-04-12 LAB — CBC
MCHC: 32.9
RBC: 4.67
WBC: 7.8

## 2011-04-12 LAB — POCT I-STAT 3, ART BLOOD GAS (G3+)
O2 Saturation: 94
Patient temperature: 98.6
TCO2: 27

## 2011-04-12 LAB — DIFFERENTIAL
Basophils Absolute: 0
Basophils Relative: 0
Eosinophils Relative: 3
Monocytes Absolute: 0.5
Neutro Abs: 5.5

## 2011-04-12 LAB — D-DIMER, QUANTITATIVE: D-Dimer, Quant: 0.79 — ABNORMAL HIGH

## 2011-04-12 LAB — B-NATRIURETIC PEPTIDE (CONVERTED LAB): Pro B Natriuretic peptide (BNP): 58

## 2011-04-23 ENCOUNTER — Emergency Department (HOSPITAL_COMMUNITY)
Admission: EM | Admit: 2011-04-23 | Discharge: 2011-04-24 | Disposition: A | Discharge status: Other Acute Inpatient Hospital | Payer: Self-pay | Attending: Emergency Medicine | Admitting: Emergency Medicine

## 2011-04-23 DIAGNOSIS — J45909 Unspecified asthma, uncomplicated: Secondary | ICD-10-CM | POA: Insufficient documentation

## 2011-04-23 DIAGNOSIS — E039 Hypothyroidism, unspecified: Secondary | ICD-10-CM | POA: Insufficient documentation

## 2011-04-23 DIAGNOSIS — R443 Hallucinations, unspecified: Secondary | ICD-10-CM | POA: Insufficient documentation

## 2011-04-23 LAB — DIFFERENTIAL
Basophils Relative: 1 % (ref 0–1)
Eosinophils Absolute: 0.1 10*3/uL (ref 0.0–0.7)
Eosinophils Relative: 2 % (ref 0–5)
Monocytes Absolute: 0.6 10*3/uL (ref 0.1–1.0)
Monocytes Relative: 9 % (ref 3–12)

## 2011-04-23 LAB — CBC
MCH: 28.3 pg (ref 26.0–34.0)
MCHC: 32.4 g/dL (ref 30.0–36.0)
Platelets: 303 10*3/uL (ref 150–400)

## 2011-04-23 LAB — ETHANOL: Alcohol, Ethyl (B): <11 mg/dL (ref 0–11)

## 2011-04-23 LAB — COMPREHENSIVE METABOLIC PANEL
Albumin: 4.5 g/dL (ref 3.5–5.2)
Alkaline Phosphatase: 54 U/L (ref 39–117)
BUN: 6 mg/dL (ref 6–23)
Chloride: 98 mEq/L (ref 96–112)
Glucose, Bld: 95 mg/dL (ref 70–99)
Potassium: 3.1 mEq/L — ABNORMAL LOW (ref 3.5–5.1)
Total Bilirubin: 0.3 mg/dL (ref 0.3–1.2)

## 2011-04-23 LAB — RAPID URINE DRUG SCREEN, HOSP PERFORMED
Opiates: NOT DETECTED
Tetrahydrocannabinol: NOT DETECTED

## 2011-04-24 ENCOUNTER — Inpatient Hospital Stay (HOSPITAL_COMMUNITY)
Admission: RE | Admit: 2011-04-24 | Discharge: 2011-05-14 | DRG: 885 | Disposition: A | Discharge status: Home / Self Care | Payer: PRIVATE HEALTH INSURANCE | Source: Ambulatory Visit | Attending: Psychiatry | Admitting: Psychiatry

## 2011-04-24 ENCOUNTER — Emergency Department (HOSPITAL_COMMUNITY): Payer: Self-pay

## 2011-04-24 DIAGNOSIS — F29 Unspecified psychosis not due to a substance or known physiological condition: Principal | ICD-10-CM

## 2011-04-24 DIAGNOSIS — Z818 Family history of other mental and behavioral disorders: Secondary | ICD-10-CM

## 2011-04-24 DIAGNOSIS — J45909 Unspecified asthma, uncomplicated: Secondary | ICD-10-CM

## 2011-04-24 DIAGNOSIS — F200 Paranoid schizophrenia: Secondary | ICD-10-CM

## 2011-04-24 DIAGNOSIS — Z79899 Other long term (current) drug therapy: Secondary | ICD-10-CM

## 2011-04-24 DIAGNOSIS — E039 Hypothyroidism, unspecified: Secondary | ICD-10-CM

## 2011-04-24 DIAGNOSIS — F323 Major depressive disorder, single episode, severe with psychotic features: Secondary | ICD-10-CM

## 2011-04-24 LAB — TSH: TSH: 4.344 u[IU]/mL (ref 0.350–4.500)

## 2011-04-24 LAB — T4, FREE: Free T4: 1.72 ng/dL (ref 0.80–1.80)

## 2011-04-25 LAB — COMPREHENSIVE METABOLIC PANEL
ALT: 15 U/L (ref 0–35)
AST: 19 U/L (ref 0–37)
Albumin: 4.4 g/dL (ref 3.5–5.2)
CO2: 30 mEq/L (ref 19–32)
Calcium: 10.3 mg/dL (ref 8.4–10.5)
Chloride: 99 mEq/L (ref 96–112)
GFR calc non Af Amer: >90 mL/min (ref >90)
Sodium: 138 mEq/L (ref 135–145)
Total Bilirubin: 0.2 mg/dL — ABNORMAL LOW (ref 0.3–1.2)

## 2011-04-25 LAB — CBC
Platelets: 267 10*3/uL (ref 150–400)
RBC: 4.01 MIL/uL (ref 3.87–5.11)
WBC: 5.8 10*3/uL (ref 4.0–10.5)

## 2011-04-25 LAB — DIFFERENTIAL
Basophils Absolute: 0 10*3/uL (ref 0.0–0.1)
Basophils Relative: 1 % (ref 0–1)
Eosinophils Absolute: 0.1 10*3/uL (ref 0.0–0.7)
Neutrophils Relative %: 64 % (ref 43–77)

## 2011-04-27 LAB — URINE MICROSCOPIC-ADD ON

## 2011-04-27 LAB — URINALYSIS, ROUTINE W REFLEX MICROSCOPIC
Glucose, UA: NEGATIVE mg/dL
Hgb urine dipstick: NEGATIVE
Protein, ur: NEGATIVE mg/dL
pH: 7 (ref 5.0–8.0)

## 2011-04-27 NOTE — Assessment & Plan Note (Signed)
Colleen Peck, Colleen Peck NO.:  [id number]  MEDICAL RECORD NO.:  [id number]  LOCATION:  0402                          FACILITY:  [facility]  PHYSICIAN:  Eulogio Ditch, MD DATE OF BIRTH:  [birth year]  DATE OF ADMISSION:  04/24/2011 DATE OF DISCHARGE:                      PSYCHIATRIC ADMISSION ASSESSMENT   The commitment papers indicate that she was paranoid and unable to advocate for herself today. This is a 30 year old single [place] American female.  She originally presented to [facility] on October 12.  She has a history of hypothyroidism. She was complaining about hallucinations. She has been seen by her endocrinologist, Dr. Talmage Nap, who  had increased her thyroid. Back on September 27, she was noted to have an elevated TSH at 35.5.  Her Synthroid was adjusted, and she has had a good response.  Her TSH yesterday was 4.34.  At any rate, she has not been sleeping well since her thyroid was being adjusted. Her hallucinations are actually more paranoia. She states that, due to not being able to sleep at night, everything makes her jump up, and she is fearful.  She did not want her nieces and nephews to witness her acting "crazy" and hence she accepted being sent for psychiatric evaluation.  PAST PSYCHIATRIC HISTORY:  She has no formal psychiatric care.  She has been to talk therapy but had stopped due to not having any insurance at the moment.  SOCIAL HISTORY:  She obtained her BS in early education in 2008.  She has never married.  She has no children.  She is employed as a Scientist, clinical (histocompatibility and immunogenetics) at [employer] and lives with her mother.  FAMILY HISTORY:  Her maternal grandmother had depression and anxiety. She is not sure what treatment she may have had.  ALCOHOL AND DRUG HISTORY:  She has never had any issues with this.  PRIMARY CARE PROVIDER:  Nurse practitioner, Hayden Rasmussen.  She sees Dr. Talmage Nap for her endocrinology  issues.  MEDICAL PROBLEMS:  Back in September  2009, she was diagnosed with Graves disease.  She swallowed the pill.  She subsequently developed hypothyroidism and has had treatment through Dr. Talmage Nap.  MEDICATIONS: 1. She is currently prescribed 100 mcg of levothyroxine p.o. daily. 2. Zyrtec 1 p.o. daily p.r.n. 3. Albuterol 2 puffs p.r.n. 4. Multivitamins 1 tablet p.o. daily p.r.n. 5. Tylenol p.r.n. She states at that she did try Lexapro for a couple of weeks back in the spring of 2010.  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  DRUG ALLERGIES:  There is a questionable allergy to Pen-Vee K.  She is not sure what reaction she may or may not have had.  POSITIVE PHYSICAL FINDINGS:  She was medically cleared at [facility]. She was afebrile; her temperature was 98-98.4.  Her pulse was 80 to 84, respirations 16-18.  Blood pressure was 102/65 to 108/72.  She is a little bit anemic at 10.7 and 33.  Her potassium was a little bit low at 3.1, and then her total protein was slightly high at 8.7.  She had no substances in her urine and no alcohol.  MENTAL STATUS  EXAM:  She is alert and oriented.  She is casually groomed and dressed in her pajamas.  Her speech is normal rate, rhythm, and tone.  She does have a sore throat at the moment, so she is a little hoarse.  Her mood is depressed.  Thought process is clear today, rational, goal oriented. She denies hearing sounds last night, and she actually slept.  Judgment and insight are intact. Concentration and memory are intact.  Intelligence is at least average.  She is not suicidal or homicidal, and the noises seem to have abated with her first dose of Risperdal last night.  DIAGNOSES:  Axis I:  Adjustment disorder from hypothyroidism with psychotic features. Axis II:  Deferred. Axis III:  [place] [address], now hypothyroid and being treated; history of asthma. Axis IV:  Underemployed. Axis  V:  35.  PLAN:  To admit for safety and stabilization.  She was started on Risperdal 0.5 at bedtime last night, and she can also take some every 6 hours p.r.n. paranoia. Her other daily medications were continued.  She will be allowed to transfer to the 500 hall. Estimated length of stay is just 2-3 days.     Mickie Leonarda Salon, P.A.-C.   ______________________________ Eulogio Ditch, MD    MD/MEDQ  D:  04/25/2011  T:  04/26/2011  Job:  [id number]  Electronically Signed by Jaci Lazier ADAMS P.A.-C. on 04/26/2011 11:49:58 AM Electronically Signed by Eulogio Ditch  on 04/27/2011 09:56:51 AM

## 2011-05-01 LAB — TSH: TSH: 1.455 u[IU]/mL (ref 0.350–4.500)

## 2011-05-12 ENCOUNTER — Other Ambulatory Visit (HOSPITAL_COMMUNITY): Payer: PRIVATE HEALTH INSURANCE

## 2011-05-13 DIAGNOSIS — F29 Unspecified psychosis not due to a substance or known physiological condition: Secondary | ICD-10-CM

## 2011-05-14 ENCOUNTER — Inpatient Hospital Stay (HOSPITAL_COMMUNITY): Payer: PRIVATE HEALTH INSURANCE

## 2011-05-14 MED ORDER — ALUM & MAG HYDROXIDE-SIMETH 200-200-20 MG/5ML PO SUSP: 30.0000 mL | ORAL | Status: DC | PRN

## 2011-05-14 MED ORDER — RISPERIDONE 3 MG PO TABS: 3.0000 mg | ORAL_TABLET | Freq: Every day | ORAL | Status: DC

## 2011-05-14 MED ORDER — ACETAMINOPHEN 325 MG PO TABS: 650.0000 mg | ORAL_TABLET | Freq: Four times a day (QID) | ORAL | Status: DC | PRN

## 2011-05-14 MED ORDER — MAGNESIUM HYDROXIDE 400 MG/5ML PO SUSP: 30.0000 mL | Freq: Every day | ORAL | Status: DC | PRN

## 2011-05-14 MED ORDER — LORAZEPAM 1 MG PO TABS: 1.0000 mg | ORAL_TABLET | ORAL | Status: DC | PRN

## 2011-05-14 MED ORDER — ALBUTEROL SULFATE HFA 108 (90 BASE) MCG/ACT IN AERS: 2.0000 | INHALATION_SPRAY | RESPIRATORY_TRACT | Status: DC | PRN

## 2011-05-14 MED ORDER — THERA M PLUS PO TABS: 1.0000 | ORAL_TABLET | Freq: Every day | ORAL | Status: DC

## 2011-05-14 MED ORDER — BISACODYL 5 MG PO TBEC: 10.0000 mg | DELAYED_RELEASE_TABLET | Freq: Every evening | ORAL | Status: DC | PRN

## 2011-05-14 MED ORDER — CLONAZEPAM 0.5 MG PO TABS: 0.5000 mg | ORAL_TABLET | Freq: Every day | ORAL | Status: DC

## 2011-05-14 MED ORDER — HYDROXYZINE HCL 50 MG PO TABS
50.0000 mg | ORAL_TABLET | Freq: Every evening | ORAL | Status: DC | PRN
  Filled 2011-05-14: qty 1

## 2011-05-14 MED ORDER — LEVOTHYROXINE SODIUM 125 MCG PO TABS: 125.0000 ug | ORAL_TABLET | Freq: Every day | ORAL | Status: DC

## 2011-05-14 MED ORDER — CITALOPRAM HYDROBROMIDE 40 MG PO TABS: 40.0000 mg | ORAL_TABLET | Freq: Every day | ORAL | Status: DC

## 2011-05-17 ENCOUNTER — Other Ambulatory Visit (HOSPITAL_COMMUNITY): Payer: PRIVATE HEALTH INSURANCE | Attending: Psychiatry | Admitting: Psychiatry

## 2011-05-17 DIAGNOSIS — F323 Major depressive disorder, single episode, severe with psychotic features: Secondary | ICD-10-CM | POA: Insufficient documentation

## 2011-05-17 DIAGNOSIS — F259 Schizoaffective disorder, unspecified: Secondary | ICD-10-CM | POA: Insufficient documentation

## 2011-05-17 DIAGNOSIS — F29 Unspecified psychosis not due to a substance or known physiological condition: Secondary | ICD-10-CM | POA: Insufficient documentation

## 2011-05-17 NOTE — Progress Notes (Unsigned)
    Daily Group Progress Note  Program: IOP  Group Time: 9:00-10:30 am  Participation Level: Active  Behavioral Response: Appropriate, Suspicious, Motivated and aprehensive and observant  Type of Therapy:  Process Group  Summary of Progress: This was the patients first full day in the group. The patient was encouraged to share and expressed feelings of fear and uncertainty about opening up to a group of strangers. Pt received support from others who struggled with similar fears early on in their treatment. Pt shared her desire to get well soon and return to her previous level of functioning when she was well. Pt was allowed to observe the group process and work towards feeling comfortable with the other members. Pt appeared attentive.     Group Time: 10:30 am-12:00 pm  Participation Level:  Active  Behavioral Response: Appropriate, Suspicious and Motivated  Type of Therapy: Grief and Loss  Summary of Progress: Patient actively participated in a discussion on different losses that impact mental health and overall wellness. Patient described specific losses her is experiencing and the feelings and barriers associated with the grieving process.   Carman Ching, LCSW

## 2011-05-18 ENCOUNTER — Other Ambulatory Visit (HOSPITAL_COMMUNITY): Payer: PRIVATE HEALTH INSURANCE | Admitting: Psychiatry

## 2011-05-18 DIAGNOSIS — F341 Dysthymic disorder: Secondary | ICD-10-CM | POA: Insufficient documentation

## 2011-05-18 NOTE — Progress Notes (Unsigned)
    Daily Group Progress Note  Program: IOP  Group Time: 9:00-10:30 am  Participation Level: Minimal  Behavioral Response: Appropriate and reserved and observing  Type of Therapy:  Education and Training Group  Summary of Progress: Patient participated in a medication group with the program pharmacist on medication management and was allowed the opportunity to ask specific questions regarding their own medication regimen.     Group Time: 10:30 am -12:00 pm  Participation Level:  Minimal  Behavioral Response: Appropriate and Observing, reserved  Type of Therapy: Process Group  Summary of Progress: Patient was dressed in dress attire today and described trying to dress up to make herself feel better. Pts affect continues to be stunted and her behavioral responses slowed. Pt states she wrote down some things she wants to share with the group because she has a difficult time knowing what to share. Pt did not volunteer to share today and appears to require encouragement and opportunity to participate. Pt is working on trusting the group.   Carman Ching, LCSW

## 2011-05-19 ENCOUNTER — Other Ambulatory Visit (HOSPITAL_COMMUNITY): Payer: PRIVATE HEALTH INSURANCE | Admitting: Psychiatry

## 2011-05-19 ENCOUNTER — Other Ambulatory Visit (HOSPITAL_COMMUNITY): Payer: PRIVATE HEALTH INSURANCE

## 2011-05-19 DIAGNOSIS — F418 Other specified anxiety disorders: Secondary | ICD-10-CM | POA: Insufficient documentation
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
## 2011-05-20 ENCOUNTER — Other Ambulatory Visit (HOSPITAL_COMMUNITY): Payer: PRIVATE HEALTH INSURANCE

## 2011-05-20 NOTE — Progress Notes (Signed)
    Daily Group Progress Note  Program: IOP  Group Time: 9:00-10:30 am   Participation Level: Active  Behavioral Response: Appropriate, Sharing, Care-Taking, Motivated and Assertive  Type of Therapy:  Process Group  Summary of Progress: Patient is more invovled and active in discussions. She reports high depression today, but is opening up more about her feelings and trusting the group. Pt described how strong she feels by addressing her feelings instead of avoiding them.      Group Time:10:30 am - 12:00 pm   Participation Level:  Active  Behavioral Response: Appropriate, Sharing, Care-Taking, Motivated and Assertive  Type of Therapy: Education and Training Group  Summary of Progress: Patient participated in an educational discussion on boundary setting and identified areas where boundaries need to be set for overall wellness.   Maxcine Ham, LCSW

## 2011-05-21 ENCOUNTER — Other Ambulatory Visit (HOSPITAL_COMMUNITY): Payer: PRIVATE HEALTH INSURANCE

## 2011-05-21 ENCOUNTER — Encounter (HOSPITAL_COMMUNITY): Payer: PRIVATE HEALTH INSURANCE

## 2011-05-21 ENCOUNTER — Other Ambulatory Visit (HOSPITAL_COMMUNITY): Payer: PRIVATE HEALTH INSURANCE | Attending: Psychiatry

## 2011-05-21 NOTE — Progress Notes (Signed)
    Daily Group Progress Note  Program: IOP  Group Time: 9:00-10:30 am   Participation Level: Active  Behavioral Response: Appropriate, Sharing, Care-Taking, Motivated and Assertive  Type of Therapy:  Process Group  Summary of Progress: Patient continues to work on trusting the group to share. Pt expressed feelings of guilt associated with trying to set healthy boundaries with her family and themes of having high expectations for herself regarding healing quickly. Pt presents with shame regarding desiring more independence and requiring her families support.      Group Time: 10:30 am - 12:00 pm   Participation Level:  Active  Behavioral Response: Appropriate and Motivated  Type of Therapy: Education and Training Group  Summary of Progress: Patient participated in a guided visualization exercise and practiced using this as a calming strategy to use to manage mood stability.    Carollee Herter Kani Jobson,LCSW

## 2011-05-24 ENCOUNTER — Other Ambulatory Visit (HOSPITAL_COMMUNITY): Payer: PRIVATE HEALTH INSURANCE

## 2011-05-24 NOTE — Progress Notes (Signed)
    Daily Group Progress Note  Program: IOP  Group Time: 9:00-10:30 am  Participation Level: Active  Behavioral Response: Appropriate, Sharing, Motivated and Assertive  Type of Therapy:  Process Group  Summary of Progress: Patient talked about her relationship with family members, particularly her father.  Discussed how she learned that "you keep your family business to yourself," which is in part why patient has had difficulty opening up in group.  The talked about the pressure her father puts on her to "graduate from college, get a job, have grandchildren" and how she puts this pressure on herself.  Patient was able to recognize that she was feeling anger about this issue.     Group Time: 10:30 am - 12:00 pm  Participation Level:  Minimal  Behavioral Response: Appropriate  Type of Therapy: Grief & Loss  Summary of Progress:   Patient participated in a discussion on grief and loss issues with the Chaplain and identified themes that get in the way of overall wellness.    [name], LMFT, CTS

## 2011-05-25 ENCOUNTER — Other Ambulatory Visit (HOSPITAL_COMMUNITY): Payer: PRIVATE HEALTH INSURANCE

## 2011-05-25 NOTE — Progress Notes (Signed)
    Daily Group Progress Note  Program: IOP  Group Time: 9:00-10:30 am   Participation Level: Active  Behavioral Response: Appropriate  Type of Therapy:  Process Group  Summary of Progress: Patient processed feelings of depression and identified how Pt manages uncomfortable feelings while receiving support and encouragement from the group.      Group Time: 10:30 am - 12:00 pm   Participation Level:  Active  Behavioral Response: Appropriate  Type of Therapy: Psycho-education Group  Summary of Progress: Pt participated in a discussion on thoughts, feelings and behaviors and identifying how to distinguish between them and manage difficult feelings.   Maxcine Ham, LCSW

## 2011-05-26 ENCOUNTER — Other Ambulatory Visit (HOSPITAL_COMMUNITY): Payer: PRIVATE HEALTH INSURANCE

## 2011-05-26 NOTE — Progress Notes (Signed)
    Daily Group Progress Note  Program: IOP  Group Time: 9:00 - 10:30 a.m.  Participation Level: Active  Behavioral Response: Appropriate, Motivated and Assertive  Type of Therapy:  Process Group  Summary of Progress: Patient talked about losses she has had in the past few years.  These included her feelings associated with having an abortion (how it affected her emotionally and spiritually).  Patient states she believes she "murdered her child" and that she fears she may be punished by not being able to have children in the future although this thought is unfounded.  She has also had multiple deaths of cousins that were violent including automobile accidents and suicide.  Patient talked about "learning to be strong" which lead to her not crying and stuffing feelings.  Patient believes this is why she ended up in treatment.  Group Time: 11:00 a.m. - 12:00 noon  Participation Level:  Active  Behavioral Response: Appropriate  Type of Therapy: Psycho-education Group  Summary of Progress: Patient participated minimally in the discussion about containment of feelings in recovery as well as what makes her feel safe.   [name], LMFT, CTS

## 2011-05-27 ENCOUNTER — Other Ambulatory Visit (HOSPITAL_COMMUNITY): Payer: PRIVATE HEALTH INSURANCE | Admitting: Psychiatry

## 2011-05-27 ENCOUNTER — Other Ambulatory Visit (HOSPITAL_COMMUNITY): Payer: PRIVATE HEALTH INSURANCE

## 2011-05-27 NOTE — Progress Notes (Signed)
    Daily Group Progress Note  Program: IOP  Group Time: 9:00-10:30 am   Participation Level: Active  Behavioral Response: Appropriate  Type of Therapy:  Process Group  Summary of Progress: Patient expressed feelings of anger towards her family and friends and continues to get in touch with feeling and identifying feelings instead of holding them in and learning who she is apart from others.      Group Time: 10:30 am - 12:00 pm   Participation Level:  Active  Behavioral Response: Appropriate  Type of Therapy: Psycho-education Group  Summary of Progress:  Patient participated in an educational discussion on defining anxiety and on how to use different coping strategies to manage anxiety.  Maxcine Ham, LCSW

## 2011-05-28 ENCOUNTER — Other Ambulatory Visit (HOSPITAL_COMMUNITY): Payer: PRIVATE HEALTH INSURANCE

## 2011-05-28 ENCOUNTER — Encounter (HOSPITAL_COMMUNITY): Payer: PRIVATE HEALTH INSURANCE

## 2011-05-28 NOTE — Progress Notes (Signed)
    Daily Group Progress Note  Program: IOP  Group Time:9000-1030  Participation Level: Active  Behavioral Response: Appropriate, Sharing, Motivated and fearful of inability to become pregnant due to comments of another patient  Type of Therapy:  Process Group  Summary of Progress:   Colleen Peck was involved in intial discussion related to comparison of depression to other chronic illness.  When a peer shared a history of thyroid problem leading to infertility, Colleen Peck became worried about her own situation.  Group reassured her the two conditions are not the same, but she remained inwardly focused for some time after that.  As the group processed another patient's confrontation of leader, she became alarmed by the slightly raised voices and started apologizing.  Peers were reassuring, but she had difficulty accepting the perceived conflict in the room.    Group Time: 1045-1200  Participation Level:  Active  Behavioral Response: Appropriate and Sharing  Type of Therapy: Psycho-education Group  Summary of Progress:  This group focused on the difficulty people sometimes have in saying good-bye or in ending a relationship in the context of boundaries.  A number of people got in touch with grief related to recent or distant losses that remain unresolved.  Goodbyes were shared with the group facilitator who will be leaving to have her baby as an excellent example of how to end the therapeutic relationship.  Shonna Chock, APRN, MS

## 2011-05-31 ENCOUNTER — Other Ambulatory Visit (HOSPITAL_COMMUNITY): Payer: PRIVATE HEALTH INSURANCE

## 2011-05-31 ENCOUNTER — Telehealth (HOSPITAL_COMMUNITY): Payer: Self-pay | Admitting: Psychiatry

## 2011-05-31 ENCOUNTER — Other Ambulatory Visit (HOSPITAL_COMMUNITY): Payer: PRIVATE HEALTH INSURANCE | Admitting: Psychiatry

## 2011-05-31 NOTE — Progress Notes (Signed)
   [facility] Behavioral Health Follow-up Outpatient Visit  Colleen Peck 1981/04/07  Date: 05-31-11   Subjective: Im feeling anxious and having nightmares that are scary , I want to start work but am afraid. There were no vitals filed for this visit.  Mental Status Examination  Appearance: normal Alert: Yes Attention: fair  Cooperative: Yes Eye Contact: Fair Speech: normal Psychomotor Activity: Normal Memory/Concentration: fair Oriented: person, place, time/date, situation, day of week, month of year and year Mood: Anxious and Dysphoric Affect: Restricted Thought Processes and Associations: Linear Fund of Knowledge: Good Thought Content: No si/hi/ no hall/ del. Insight: Fair Judgement: Good  Diagnosis: depression, anxiety  Treatment Plan: increase Haldol 4 mg q hs, cont other meds. Aubreanna Percle.Saphyre Cillo.M.D. [facility]

## 2011-05-31 NOTE — Progress Notes (Signed)
    Daily Group Progress Note  Program: IOP  Group Time: 9:00 - 10:30 a.m.  Participation Level: Active  Behavioral Response: Sharing and Motivated  Type of Therapy:  Process Group  Summary of Progress: Patient talked about not having a good weekend because she was "tired of people not being nice."  She did not elaborate when asked.  She did state that when she does activities that she enjoys they eventually turn out to be a burden because she "looks at activities as work."  Patient got feedback from others in group regarding ways they learned to enjoy doing activities "just for them."     Group Time: 11:00 - noon  Participation Level:  Active  Behavioral Response: Appropriate and Motivated  Type of Therapy: Psycho-education Group  Summary of Progress:   Patient attended the grief and loss group conducted by [employer].  [name], LMFT, CTS

## 2011-06-01 ENCOUNTER — Other Ambulatory Visit (HOSPITAL_COMMUNITY): Payer: PRIVATE HEALTH INSURANCE

## 2011-06-01 MED ORDER — CITALOPRAM HYDROBROMIDE 40 MG PO TABS: 40.0000 mg | ORAL_TABLET | Freq: Every day | ORAL | Status: DC

## 2011-06-01 MED ORDER — HALOPERIDOL 1 MG PO TABS: 1.0000 mg | ORAL_TABLET | Freq: Four times a day (QID) | ORAL | Status: DC

## 2011-06-01 NOTE — Progress Notes (Signed)
    Daily Group Progress Note  Program: IOP  Group Time: 9:00 - 10:30 a.m.  Participation Level: Active  Behavioral Response: Appropriate, Sharing, Motivated and Assertive  Type of Therapy:  Process Group  Summary of Progress: Patient reports feeling agitated during the group session but did not talk about what was happening in her life to cause the agitation.  Patient did talk about the group not feeling safe anymore due to another group member.  When asked to talk about what was bothering her she opted not to talk about the behavior of the other group member.  Group Time: 11:00 - Noon Participation Level:  Minimal  Behavioral Response: Appropriate and Motivated  Type of Therapy: Psycho-education Group  Summary of Progress: The topic of discussion was holiday tips for people with depression, anxiety, or who have lost a loved one.  Patient was quiet during the group but body language showed that she was listening e.g., good eye contact.  [name], LMFT, CTS

## 2011-06-02 ENCOUNTER — Other Ambulatory Visit (HOSPITAL_COMMUNITY): Payer: PRIVATE HEALTH INSURANCE | Admitting: Psychiatry

## 2011-06-02 ENCOUNTER — Other Ambulatory Visit (HOSPITAL_COMMUNITY): Payer: PRIVATE HEALTH INSURANCE

## 2011-06-02 NOTE — Progress Notes (Signed)
    Daily Group Progress Note  Program: IOP  Group Time: 1000-1200  Participation Level: Active  Behavioral Response: Appropriate  Type of Therapy:  Process Group  Summary of Progress: Colleen Peck was more assertive today although still struggling with feeling depressed and wishing "for a quick fix".  She has been able to bond with the group and give and receive appropriate feedback.    Group Time: 0900-1000  Participation Level:  Active  Behavioral Response: Appropriate  Type of Therapy: Psycho-education Group  Medication Q & A  Summary of Progress:  Pharmacist, Michelle Nasuti , took questions and explained the various classifications of psychotropic medication, side effects and and management, and length of treatment protocols.   Shonna Chock, APRN, CNS

## 2011-06-04 ENCOUNTER — Other Ambulatory Visit (HOSPITAL_COMMUNITY): Payer: PRIVATE HEALTH INSURANCE

## 2011-06-07 ENCOUNTER — Other Ambulatory Visit (HOSPITAL_COMMUNITY): Payer: PRIVATE HEALTH INSURANCE

## 2011-06-07 MED ORDER — CLONAZEPAM 1 MG PO TABS: 1.0000 mg | ORAL_TABLET | Freq: Two times a day (BID) | ORAL | Status: DC

## 2011-06-07 NOTE — Progress Notes (Signed)
    Daily Group Progress Note  Program: IOP  Group Time: 9:00 - 10:30 a.m.  Participation Level: Active  Behavioral Response: Appropriate, Sharing and Motivated  Type of Therapy:  Process Group  Summary of Progress: Patient reports she had struggled emotionally during the Thanksgiving weekend.  She reports wanting to be in bed the whole weekend which she did not.  She did start work reporting returning was stressful.    Group Time: 11:00 - Noon  Participation Level:  Active  Behavioral Response: Appropriate and Motivated  Type of Therapy: Psycho-education Group  Summary of Progress: Patient attended the grief and loss group.  [name], LMFT, CTS

## 2011-06-08 ENCOUNTER — Other Ambulatory Visit (HOSPITAL_COMMUNITY): Payer: PRIVATE HEALTH INSURANCE | Admitting: Psychiatry

## 2011-06-08 ENCOUNTER — Other Ambulatory Visit (HOSPITAL_COMMUNITY): Payer: PRIVATE HEALTH INSURANCE

## 2011-06-08 NOTE — Progress Notes (Signed)
    Daily Group Progress Note  Program: IOP  Group Time: 9:00 - 10:30 a.m.  Participation Level: Active  Behavioral Response: Appropriate, Sharing and Motivated  Type of Therapy:  Process Group  Summary of Progress: Patient talked about what she was doing to cope including "trying to do the opposite of how she is feelings.  Patient reports taking a chance last night.  She reports she was afraid to go out in public due to her anxiety, but did it anyway.  Patient stated, "I refuse to live like this the rest of my life."  Group Time: 11:00 - Noon  Participation Level:  Minimal  Behavioral Response: Appropriate, Sharing and Motivated  Type of Therapy: Psycho-education Group  Summary of Progress: Patient attended group on assertive communication and personal rights.  Patient participated minimally stating she was "exhausted" due to not sleeping.  [name], LMFT, CTS

## 2011-06-09 ENCOUNTER — Other Ambulatory Visit (HOSPITAL_COMMUNITY): Payer: PRIVATE HEALTH INSURANCE | Admitting: Psychiatry

## 2011-06-09 ENCOUNTER — Other Ambulatory Visit (HOSPITAL_COMMUNITY): Payer: PRIVATE HEALTH INSURANCE

## 2011-06-09 NOTE — Progress Notes (Signed)
    Daily Group Progress Note  Program: IOP  Group Time: 9:00 - 10:30 a.m.  Participation Level: Active  Behavioral Response: Appropriate, Sharing and Motivated  Type of Therapy:  Process Group  Summary of Progress: Patient reports she is more frustrated at this time because it is getting near the end of her IOP.  She reports the main reason is that she has no plan for when she is discharged.  Through support from the group patient has committed to making a plan.  Group Time: 11:00 - Noon  Participation Level:  Minimal  Behavioral Response: Appropriate and Motivated  Type of Therapy: Psycho-education Group  Summary of Progress: Patient participated in a group on cognitive behavior therapy.  Patient was able to identify some of the cognitive distortions she uses keeping her anxious and depressed.  [name], LMFT, CTS

## 2011-06-10 ENCOUNTER — Other Ambulatory Visit (HOSPITAL_COMMUNITY): Payer: PRIVATE HEALTH INSURANCE | Admitting: Psychiatry

## 2011-06-10 NOTE — Progress Notes (Signed)
    Daily Group Progress Note  Program: IOP  Group Time: 9:00 - 10:30 a.m.  Participation Level: Minimal  Behavioral Response: Appropriate, Sharing and Motivated  Type of Therapy:  Process Group  Summary of Progress: patient reported her grandfather died and "this is another funeral I have to go to."  She reports she does not want to go due to the multiple deaths in her family and also how going in treatment has affected her emotionally.  Patient continues to state she refuses to live feeling the way she is feeling (meaning she is working hard).  Group Time: 11:00 - Noon  Participation Level:  Minimal  Behavioral Response: Appropriate, Sharing and Motivated  Type of Therapy: Psycho-education Group  Summary of Progress: Patient participated in group on discharge planning.  Patient reports she does have a lot of support to help her however she has a long way to go to get a recovery plan in place.  [name], LMFT, CTS

## 2011-06-11 ENCOUNTER — Other Ambulatory Visit (HOSPITAL_COMMUNITY): Payer: PRIVATE HEALTH INSURANCE | Admitting: Psychiatry

## 2011-06-11 ENCOUNTER — Other Ambulatory Visit (HOSPITAL_COMMUNITY): Payer: PRIVATE HEALTH INSURANCE

## 2011-06-11 MED ORDER — CITALOPRAM HYDROBROMIDE 40 MG PO TABS: 40.0000 mg | ORAL_TABLET | Freq: Every day | ORAL | Status: DC

## 2011-06-11 MED ORDER — CLONAZEPAM 1 MG PO TABS: 1.0000 mg | ORAL_TABLET | Freq: Two times a day (BID) | ORAL | Status: DC

## 2011-06-11 MED ORDER — HALOPERIDOL 1 MG PO TABS: 1.0000 mg | ORAL_TABLET | Freq: Four times a day (QID) | ORAL | Status: DC

## 2011-06-11 MED ORDER — LEVOTHYROXINE SODIUM 150 MCG PO TABS: 150.0000 ug | ORAL_TABLET | Freq: Every day | ORAL | Status: DC

## 2011-06-11 MED ORDER — BENZTROPINE MESYLATE 1 MG PO TABS: 1.0000 mg | ORAL_TABLET | Freq: Two times a day (BID) | ORAL | Status: DC

## 2011-06-11 NOTE — Patient Instructions (Signed)
Patient completed MH-IOP today.  Referred patient to [facility] [phone]).  Encouraged support group attendance.  Patient will follow up with [name], LPC on 06-17-11 @ 9:30 a.m and Dr. Lolly Mustache on 08-16-11 at 9 a.m.Marland Kitchen  Due to financial constraints patient is requesting to see providers in the outpatient department at [facility].  Encouraged patient to seek treatment at [facility] or go to the nearest emergency room when needed.

## 2011-06-11 NOTE — Progress Notes (Signed)
    Daily Group Progress Note  Program: IOP  Group Time: 0900-1015  Participation Level: Active  Behavioral Response: Appropriate, Sharing and Assertive  Type of Therapy:  Psycho-education Group  Summary of Progress:  Group discussion about developing a plan for self care.  Considered including enjoyable things on To-do lists, relaxation breaks during work, and avoiding being self-critical.  This generated a lot of sharing about things that have worked or not worked so well.  Handout included other items as well.     Group Time: 1030 - 1200  Participation Level:  Active  Behavioral Response: Appropriate, Sharing and Motivated  Type of Therapy: Process Group  Summary of Progress:  Colleen Peck was anxious about today being her last day.  She asked about whether she had made any progress and was given positive feedback by the group.  She handled the goodbye ceremony well.  She was complaining about some akathesia and some blunting of affect as well and was to talk with physician about these symptoms.   Shonna Chock, APRN, MS

## 2011-06-14 ENCOUNTER — Other Ambulatory Visit (HOSPITAL_COMMUNITY): Payer: PRIVATE HEALTH INSURANCE

## 2011-06-15 ENCOUNTER — Other Ambulatory Visit (HOSPITAL_COMMUNITY): Payer: PRIVATE HEALTH INSURANCE

## 2011-06-16 ENCOUNTER — Other Ambulatory Visit (HOSPITAL_COMMUNITY): Payer: PRIVATE HEALTH INSURANCE

## 2011-06-17 ENCOUNTER — Encounter (HOSPITAL_COMMUNITY): Payer: Self-pay | Admitting: Psychology

## 2011-06-17 ENCOUNTER — Ambulatory Visit (INDEPENDENT_AMBULATORY_CARE_PROVIDER_SITE_OTHER): Payer: PRIVATE HEALTH INSURANCE | Admitting: Psychology

## 2011-06-17 DIAGNOSIS — F4310 Post-traumatic stress disorder, unspecified: Secondary | ICD-10-CM | POA: Insufficient documentation

## 2011-06-17 DIAGNOSIS — F332 Major depressive disorder, recurrent severe without psychotic features: Secondary | ICD-10-CM
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
## 2011-06-18 ENCOUNTER — Other Ambulatory Visit (HOSPITAL_COMMUNITY): Payer: PRIVATE HEALTH INSURANCE

## 2011-06-21 ENCOUNTER — Other Ambulatory Visit (HOSPITAL_COMMUNITY): Payer: PRIVATE HEALTH INSURANCE

## 2011-06-22 ENCOUNTER — Other Ambulatory Visit (HOSPITAL_COMMUNITY): Payer: PRIVATE HEALTH INSURANCE

## 2011-06-23 ENCOUNTER — Ambulatory Visit (INDEPENDENT_AMBULATORY_CARE_PROVIDER_SITE_OTHER): Payer: PRIVATE HEALTH INSURANCE | Admitting: Psychology

## 2011-06-23 DIAGNOSIS — F332 Major depressive disorder, recurrent severe without psychotic features: Secondary | ICD-10-CM
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
## 2011-07-09 ENCOUNTER — Ambulatory Visit (INDEPENDENT_AMBULATORY_CARE_PROVIDER_SITE_OTHER): Payer: PRIVATE HEALTH INSURANCE | Admitting: Psychology

## 2011-07-09 DIAGNOSIS — F332 Major depressive disorder, recurrent severe without psychotic features: Secondary | ICD-10-CM

## 2011-07-09 NOTE — Progress Notes (Signed)
   THERAPIST PROGRESS NOTE  Session Time: 2.05pm-2:50pm  Participation Level: Active  Behavioral Response: Well GroomedAlertDepressed  Type of Therapy: Individual Therapy  Treatment Goals addressed: Diagnosis: MDD.  Interventions: CBT, Supportive and Other: Positive Self-Talk and reframes.  Summary: Colleen Peck is a 30 y.o. female who presents with blunted affect.  Pt reported she is tired today.   Pt reported she has started working on Fridays for past 3 weeks and feels more encouraged about this and possibility for potential of more hours.  However, pt still very focused on discouraged as feels stuck in house w/out transportation and financially unable to return to school currently.  Pt reported feeling overwhelmed w/ watching the kids at home as they are out of school and feeling like she needs to take on more chores as contributing less money. Pt was able to reframe and focus on accomplishments making and acknowledged that she tends to focus on wanting things w/out taking the time for the necessary steps- friends and family refected this to her. Pt agreed to look into other possibilities besides full time school and work as options for meeting goal of nursing degree.  Pt reported concern as gaining weight- reporting increased appetite and less active.  Pt agreed to potential of walking for increased activity and watching portions and snacking- then f/u w/ Dr. Lolly Mustache about medications and side effects.   Suicidal/Homicidal: Nowithout intent/plan  Therapist Response: Assessed pt current functioning per her report.  Processed w/ pt impact of having hours at work and benefit has to being out of the house.  Discussed other options for activities and need to set boundaries w/ requests for housechores and childcare.  Explored w/ pt accomplishments she has made and is making in recovery and encouraged pt to acknowledge these and use these as positive self talk and reframes.  Discussed how pt can  be proactive w/ exploring options for returning to school other than full time.   Plan: Return again in 1-2 weeks.  Diagnosis: Axis I: Major Depression, Recurrent severe    Axis II: Deferred    Allena Pietila, LPC 07/09/2011

## 2011-07-16 ENCOUNTER — Ambulatory Visit (HOSPITAL_COMMUNITY): Payer: PRIVATE HEALTH INSURANCE | Admitting: Psychology

## 2011-07-21 ENCOUNTER — Ambulatory Visit (INDEPENDENT_AMBULATORY_CARE_PROVIDER_SITE_OTHER): Payer: PRIVATE HEALTH INSURANCE | Admitting: Psychology

## 2011-07-21 DIAGNOSIS — F332 Major depressive disorder, recurrent severe without psychotic features: Secondary | ICD-10-CM
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
## 2011-07-28 ENCOUNTER — Ambulatory Visit (HOSPITAL_COMMUNITY): Payer: PRIVATE HEALTH INSURANCE | Admitting: Psychology

## 2011-08-03 ENCOUNTER — Ambulatory Visit (INDEPENDENT_AMBULATORY_CARE_PROVIDER_SITE_OTHER): Payer: PRIVATE HEALTH INSURANCE | Admitting: Psychology

## 2011-08-03 DIAGNOSIS — F332 Major depressive disorder, recurrent severe without psychotic features: Secondary | ICD-10-CM
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
## 2011-08-04 NOTE — Progress Notes (Signed)
Addended by: YATES, LEANNE M on: 08/04/2011 10:08 AM   Modules accepted: Level of Service  

## 2011-08-10 ENCOUNTER — Ambulatory Visit (INDEPENDENT_AMBULATORY_CARE_PROVIDER_SITE_OTHER): Payer: PRIVATE HEALTH INSURANCE | Admitting: Psychology

## 2011-08-10 DIAGNOSIS — F332 Major depressive disorder, recurrent severe without psychotic features: Secondary | ICD-10-CM

## 2011-08-10 NOTE — Progress Notes (Signed)
   THERAPIST PROGRESS NOTE  Session Time: 10.45am-11:15am  Participation Level: Active  Behavioral Response: NeatAlertDepressed  Type of Therapy: Individual Therapy  Treatment Goals addressed: Diagnosis: MDD and goal 1.  Interventions: CBT and Reframing  Summary: Colleen Peck is a 31 y.o. female who presents with depressed mood and affect.  Pt arrived 13 min late for appt and later informed that she was planning to not come today as doesn't feel like she is making progress.  Pt reported feeling depressed and hopeless that things aren't going to get better and that she is stuck w/ current situation.  Pt was able to identify that these are cognitive distortions and that she has made progress and setting unrealistic expectations for self.  Pt denies any paranoid thoughts or delusions.  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   Suicidal/Homicidal: Nowithout intent/plan  Therapist Response:  Informed pt of late appointment and time for only 30 min session. Assessed pt current functioning and feeling lack of progress.  Assisting pt in exploring this as cognitive distortions and assisted challenging w/ facts of progress and improvements make and changes w/in past week. .   Plan: Return again in 2 weeks.  Diagnosis: Axis I: Major Depression, Recurrent severe    Axis II: No diagnosis    YATES,LEANNE, LPC 08/10/2011

## 2011-08-16 ENCOUNTER — Ambulatory Visit (INDEPENDENT_AMBULATORY_CARE_PROVIDER_SITE_OTHER): Payer: PRIVATE HEALTH INSURANCE | Admitting: Psychiatry

## 2011-08-16 ENCOUNTER — Encounter (HOSPITAL_COMMUNITY): Payer: Self-pay | Admitting: Psychiatry

## 2011-08-16 VITALS — BP 110/62 | HR 80 | Ht 67.5 in | Wt 231.6 lb

## 2011-08-16 DIAGNOSIS — F329 Major depressive disorder, single episode, unspecified: Secondary | ICD-10-CM

## 2011-08-16 MED ORDER — BUPROPION HCL 100 MG PO TABS: 100.0000 mg | ORAL_TABLET | Freq: Every day | ORAL | Status: DC
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
## 2011-08-30 ENCOUNTER — Ambulatory Visit (HOSPITAL_COMMUNITY): Payer: PRIVATE HEALTH INSURANCE | Admitting: Psychology

## 2011-09-01 ENCOUNTER — Encounter (HOSPITAL_COMMUNITY): Payer: Self-pay | Admitting: Psychiatry

## 2011-09-01 ENCOUNTER — Ambulatory Visit (INDEPENDENT_AMBULATORY_CARE_PROVIDER_SITE_OTHER): Payer: PRIVATE HEALTH INSURANCE | Admitting: Psychiatry

## 2011-09-01 VITALS — BP 111/66 | HR 63 | Wt 230.0 lb

## 2011-09-01 DIAGNOSIS — F329 Major depressive disorder, single episode, unspecified: Secondary | ICD-10-CM

## 2011-09-01 MED ORDER — BUPROPION HCL ER (SR) 150 MG PO TB12: 150.0000 mg | ORAL_TABLET | Freq: Every day | ORAL | Status: DC

## 2011-09-01 MED ORDER — CITALOPRAM HYDROBROMIDE 20 MG PO TABS: 20.0000 mg | ORAL_TABLET | Freq: Every day | ORAL | Status: DC

## 2011-09-01 MED ORDER — HALOPERIDOL 2 MG PO TABS: 2.0000 mg | ORAL_TABLET | Freq: Every day | ORAL | Status: DC
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
## 2011-09-08 ENCOUNTER — Ambulatory Visit (INDEPENDENT_AMBULATORY_CARE_PROVIDER_SITE_OTHER): Payer: PRIVATE HEALTH INSURANCE | Admitting: Psychology

## 2011-09-08 DIAGNOSIS — F331 Major depressive disorder, recurrent, moderate: Secondary | ICD-10-CM

## 2011-09-08 NOTE — Progress Notes (Signed)
   THERAPIST PROGRESS NOTE  Session Time: 2pm-2:55pm  Participation Level: Active  Behavioral Response: Well GroomedAlertEuthymic  Type of Therapy: Individual Therapy  Treatment Goals addressed: Diagnosis: MDD and goal 1.  Interventions: CBT  Summary: Colleen Peck is a 31 y.o. female who presents with full and bright affect.  Pt reported that she is feeling good today and expressed feeling that she has increased awareness that she is not happy w/ her current career path, not exactly sure of path to take but ready to start making steps and motivated to exploring career path.  Pt receptive to talking w/ career services at [employer] to find out about resources.  Pt was able to focus on positives w/ car, financial improvements, increased outings to visit churches and socially visiting friends.  Pt did identify need for further outings and some barriers w/ anxiety. Pt spoke of increased acceptance of not needing to understand what happened to cause hallucinations and paranoia back in the fall and although difficult accepted possibility of thyroid, stress and depression causing.  Pt expressed hopefulness, committed to exploring career options and taking steps towards nursing school and keeping focused on positive self talk and recognition.  Suicidal/Homicidal: Nowithout intent/plan  Therapist Response: Assessed pt current functioning per her report.  Processed w/pt current mood, improvements and contributing factors.  Assisted pt in identifying next steps towards career movement and actions pt can take.  Processed w/pt her thoughts re: her mental illness and concern re: hallucinations and paranoia and reflected pt increased acceptance.  Plan: Return again in 2 weeks.  Diagnosis: Axis I: MDD    Axis II: No diagnosis    YATES,LEANNE, LPC 09/08/2011

## 2011-09-15 ENCOUNTER — Ambulatory Visit (INDEPENDENT_AMBULATORY_CARE_PROVIDER_SITE_OTHER): Payer: Self-pay | Admitting: Psychology

## 2011-09-15 DIAGNOSIS — F332 Major depressive disorder, recurrent severe without psychotic features: Secondary | ICD-10-CM

## 2011-09-15 NOTE — Progress Notes (Signed)
   THERAPIST PROGRESS NOTE  Session Time: 11.30am-12:20am  Participation Level: Active  Behavioral Response: Well GroomedAlertDepressed, tearful  Type of Therapy: Individual Therapy  Treatment Goals addressed: Diagnosis: MDD and goal 1.  Interventions: CBT, Reframing and Other: positive self care activities.  Summary: Colleen Peck is a 31 y.o. female who presents with depressed mood and affect.  Pt is tearful in session and reports today is 'lousy". Pt informed that she stopped taking her antidepressant last week after concern that dizziness, nausea and thoughts of life better if dead were side effects.  Upon further exploring pt increased insight that w/out recent change of meds not likely side effects of meds.  Pt reported no intent for suicide, doesn't want to kill self, and no plans- but reports fleeting thoughts for several months now of- maybe even prior to inpt "if didn't wake in morning- would be better" as doesn't like the depressed mood.  Pt reported increased hopefulness and improved mood of 2 weeks ago only for 2days.  Pt expressed thoughts of not living up to self expectations for almost 32, not liking her job and not liking her living arrangements. Pt was able w/ counselor assistance to make some reframes and acknowledge her worth is not contingent upon her job or where she lives.  Pt was able to identify positive self care activities that include time w/ friends, movies w/ humor and getting out of the house.  Pt agrees to go to career services today and instead of focusing on failed expectations- focus on creating new plan for self. Pt stated she will restart her medication and speak w/ nurse to receive recommendation for restarting.    Suicidal/Homicidal: Nowithout intent/plan  Therapist Response: Assessed pt current functioning per pt report.  Expressed concern for self discontinue of medication.  Processed w/ pt change in mood from last session and contributing factors of  cognitive distortions.  Reiterated to pt process of recovery, re framing negative self talk, participating in daily positive self care and acknowledging pt worth.  Colleen Peck was informed pt need to speak w/ her about Dr. Sheela Stack recommendations for restarting meds.  Plan: Return again in 1 weeks. Pt daily self care activities, pt to f/u w/ career services, pt to practice recognizing cognitive distortions.  Diagnosis: Axis I: Major Depression, Recurrent severe    Axis II: No diagnosis    Leanda Padmore, LPC 09/15/2011

## 2011-09-22 ENCOUNTER — Ambulatory Visit (HOSPITAL_COMMUNITY): Payer: PRIVATE HEALTH INSURANCE | Admitting: Psychology

## 2011-09-24 ENCOUNTER — Encounter (HOSPITAL_COMMUNITY): Payer: Self-pay | Admitting: *Deleted

## 2011-09-24 ENCOUNTER — Ambulatory Visit (INDEPENDENT_AMBULATORY_CARE_PROVIDER_SITE_OTHER): Payer: Self-pay | Admitting: Psychology

## 2011-09-24 DIAGNOSIS — F332 Major depressive disorder, recurrent severe without psychotic features: Secondary | ICD-10-CM

## 2011-09-24 NOTE — Progress Notes (Signed)
   THERAPIST PROGRESS NOTE  Session Time: 10.45am-11:30am  Participation Level: Active  Behavioral Response: Well GroomedAlertDepressed  Type of Therapy: Individual Therapy  Treatment Goals addressed: Diagnosis: MDD and goal 1.  Interventions: CBT, Strength-based and Supportive  Summary: Colleen Peck is a 31 y.o. female who presents with reported depressed mood and affect.  Pt reported having a conflict w/ sister this morning after using the door in her room to access her possession located in the garage.  Pt good insight into living situation not healthy for her as no her own space, pt has been assigned w/out asking permission to give childcare and she is not being included in decisions that are costing money.  Pt reported conflict and strain when asserting bring in her treadmill for exercise as mom and sister didn't want in the house.  Pt also reported on signing back for school in fall semester and strong desire to move out for wellness.  Pt reported still hasn't restarted antidepressant meds- will speak w/ nurse to consult w/ Arfreen re:Marland Kitchen  Pt reported occasion fleeing thoughts of life not worth living- but expresses strongly no intent, no plans and want to live.   Suicidal/Homicidal: Nowithout intent/plan  Therapist Response: ASsessed pt current funcitoning per her report.  processed w/ pt living situations and how effecting her emotionally and added stressors.  Explored w/ pt how she is communicating needs and boundaries w/ sister and mother.  ENcouraged assertive responses and setting appropriate boundaries.  Distinguished setting boundaries not as selfish but ensuring not taking advantage of.   Plan: Return again in 1 weeks.  Diagnosis: Axis I: Major Depression, Recurrent severe    Axis II: No diagnosis    Yogi Arther, LPC 09/24/2011

## 2011-09-24 NOTE — Progress Notes (Signed)
Patient in today to see Margretta Sidle. Wanted to discuss her medications and had brought bottles with her. Reviewed medication list and notes from office visits with Dr. Lolly Mustache.  Listed below is each medication, with comments in bold next to them: Synthroid 150 mcg, Take one daily. Asked what time of day to take this medication. Instructed her take in AM, two hours before or after meal and 4 hours before or after vitamin(instructions on bottle) Haldol 1 mg, Take 1 tablet 4 times daily: Told her this had been decreased during appointment on 2/20  to only take 2 mg at bedtime. She stated she had stopped taking 4 a day and had been using up the 1 mg tablets by taking 2 of them at bedtime, then she would start taking the 2 mg tablet, taking only 1 of them at bedtime Haldol 2 mg, Take 1 tablet at bedtime: Will begin taking these tablets when the 1 mg tablets are gone Wellbutrin 100 mg, Take once daily:Had one pill left.Was taking this instead of higher dose prescribed at 2/20 appointment Wellbutrin SR 150 mg, take once daily:Was increased at appointment on 2/20 but continued to take Wellbutrin 100 mg, due to confusion over instructions form past appointment. Instructed patient to begin Wellbutrin SR 150 mg as ordered on 2/20, explaining that because it had a sustained release effect, she should take in the morning. NOTE: Asked patient about taking Celexa 20 mg as ordered on 2/20. Patient states Dr. Lolly Mustache told her to stop this medication so she has not taken it since 2/20., Appointment note indicates he continued it. Instructed patient to discuss and clarify this with Dr. Lolly Mustache during next appointment 3/20.

## 2011-09-29 ENCOUNTER — Ambulatory Visit (INDEPENDENT_AMBULATORY_CARE_PROVIDER_SITE_OTHER): Payer: PRIVATE HEALTH INSURANCE | Admitting: Psychiatry

## 2011-09-29 ENCOUNTER — Encounter (HOSPITAL_COMMUNITY): Payer: Self-pay | Admitting: Psychiatry

## 2011-09-29 VITALS — BP 103/77 | HR 95 | Wt 227.6 lb

## 2011-09-29 DIAGNOSIS — F329 Major depressive disorder, single episode, unspecified: Secondary | ICD-10-CM

## 2011-09-29 MED ORDER — HALOPERIDOL 2 MG PO TABS: 2.0000 mg | ORAL_TABLET | Freq: Every day | ORAL | Status: DC

## 2011-09-29 MED ORDER — BUPROPION HCL ER (XL) 300 MG PO TB24: 300.0000 mg | ORAL_TABLET | ORAL | Status: DC
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
## 2011-10-05 ENCOUNTER — Telehealth (HOSPITAL_COMMUNITY): Payer: Self-pay | Admitting: *Deleted

## 2011-10-05 NOTE — Telephone Encounter (Signed)
3/25/13Left VM that her lips are turning darker, becoming black. States other family memebers have also noticed.Asked if one of her medicines might be causing it.    10/05/11. Called her back.Recommended she talk with her pharmacist to review meds.Also recommended she contact PCP as changes in lip color often indicate changes in iron levels and that the PCP is the person to contact for that. She verbalized understanding.

## 2011-10-06 ENCOUNTER — Ambulatory Visit (INDEPENDENT_AMBULATORY_CARE_PROVIDER_SITE_OTHER): Payer: PRIVATE HEALTH INSURANCE | Admitting: Psychology

## 2011-10-06 DIAGNOSIS — F332 Major depressive disorder, recurrent severe without psychotic features: Secondary | ICD-10-CM

## 2011-10-06 DIAGNOSIS — F4310 Post-traumatic stress disorder, unspecified: Secondary | ICD-10-CM

## 2011-10-06 NOTE — Progress Notes (Signed)
   THERAPIST PROGRESS NOTE  Session Time: 11.25am-12:15pm  Participation Level: Active  Behavioral Response: Well GroomedAlertAnxious and Depressed  Type of Therapy: Individual Therapy  Treatment Goals addressed: Diagnosis: MDD and goal 1.  Interventions: CBT and Reframing  Summary: Colleen Peck is a 31 y.o. female who presents with affect congruent w/ reported depressed mood and worry about prognosis.  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  Pt expressed feeling depressed and overwhelmed.  Pt expressed thoughts of being a failure and very negative about self and isolating.   Pt did report positives of increased social interaction w/ new chruch, new job, getting up and going to work daily- however struggled to identify and speak positively about these.  Pt w/ counselor assistance was able to identify cognitive distortions and make reframes based on facts not assumptions or feelings.    Suicidal/Homicidal: Nowithout intent/plan  Therapist Response: Assessed pt current fucnitoning per her report.  Processed w/pt depressed mood and connection w/ negative thought patterns.  Assisted pt in identifying cognitive distortions and practicing reframes in session.  Plan: Return again in 2 weeks.  Diagnosis: Axis I: Major Depression, Recurrent     Axis II: No diagnosis    Barrie Sigmund, LPC 10/06/2011

## 2011-10-20 ENCOUNTER — Ambulatory Visit (INDEPENDENT_AMBULATORY_CARE_PROVIDER_SITE_OTHER): Payer: PRIVATE HEALTH INSURANCE | Admitting: Psychology

## 2011-10-20 DIAGNOSIS — F332 Major depressive disorder, recurrent severe without psychotic features: Secondary | ICD-10-CM

## 2011-10-20 NOTE — Progress Notes (Signed)
   THERAPIST PROGRESS NOTE  Session Time: 9.30am-10:15am  Participation Level: Active  Behavioral Response: Well GroomedAlertAnxious and Depressed  Type of Therapy: Individual Therapy  Treatment Goals addressed: Diagnosis: MDD and goal 1.  Interventions: CBT and Meditation: heart math breathing  Summary: Colleen Peck is a 31 y.o. female who presents with reported depressed moods, feeling overwhelmed and negative thinking.  Pt reports that she feels like she is having "pity parties for self" and reports others are expressing tired of her venting.  Pt reports she is feeling more motivated for making movement in change, but still expresses statements of feeling a failure.  Pt reported stressor of job "letting her go" as hired her full time didn't have fulltime hours so informed she could work 3rd shift or not work.  Pt however did make reframes of positives that she has been taking part in w/ friends, getting out of the house and looking into schooling options.  Pt reported biggest trouble getting distracted and not focusing as so many worries and ruminating on these.  Pt practice deep breathing practice- and reported some benefit from.  Pt agrees to practice daily and use when overwhelmed and difficulty focusing.  Suicidal/Homicidal: Nowithout intent/plan  Therapist Response: Assessed pt current functioning per her report.  Processed w/ pt thoughts and feelings and encouraged pt to challenge negative thinking in session.  Practiced w/ pt deep breathing- heartmath and processed w/ pt effects.  Plan: Return again in 1-2 weeks.  Diagnosis: Axis I: MDD    Axis II: No diagnosis    [name], LPC 10/20/2011

## 2011-10-28 ENCOUNTER — Ambulatory Visit (HOSPITAL_COMMUNITY): Payer: Self-pay | Admitting: Psychology

## 2011-10-29 ENCOUNTER — Ambulatory Visit (HOSPITAL_COMMUNITY): Payer: Self-pay | Admitting: Psychology

## 2011-10-29 ENCOUNTER — Telehealth (HOSPITAL_COMMUNITY): Payer: Self-pay

## 2011-11-01 ENCOUNTER — Ambulatory Visit (HOSPITAL_COMMUNITY): Payer: Self-pay | Admitting: Psychiatry

## 2011-11-05 ENCOUNTER — Ambulatory Visit (INDEPENDENT_AMBULATORY_CARE_PROVIDER_SITE_OTHER): Payer: PRIVATE HEALTH INSURANCE | Admitting: Psychology

## 2011-11-05 DIAGNOSIS — F332 Major depressive disorder, recurrent severe without psychotic features: Secondary | ICD-10-CM

## 2011-11-05 NOTE — Progress Notes (Signed)
   THERAPIST PROGRESS NOTE  Session Time: 10am-10:30am  Participation Level: Active  Behavioral Response: Well GroomedAlertDepressed  Type of Therapy: Individual Therapy  Treatment Goals addressed: Diagnosis: MDD and goal 1.  Interventions: CBT, Strength-based and Reframing  Summary: Colleen Peck is a 31 y.o. female who presents 30 min late- as pt called at time of appt 9:30am inquiring about appt time. Pt acknowledged that she has been cancelling late- but reports motivated for tx and acknowledged need to give days notice for not attending. Pt reported that she is depressed and struggling w/ negative self talk, but has been motivated to not stay depressed and complaining.  Pt reports that she has been getting up, accomplishing chores, getting out of the house each day, studying for nurse entrance exam, and spending time w/ friends.  Pt was able to make positive reframes w/ counselor assistance w/ negative self statements and agrees to practice this on her own.  Suicidal/Homicidal: Nowithout intent/plan  Therapist Response: Discussed pt missed appt- late cancellations and motivation for tx. Assessed pt current functioning per her report.  Processed w/ pt mood and reflected pt accomplishments as strength.  Assisted pt in reframe negative self talk by recognizes steps she is taking, goal directed and strengths.  Plan: Return again in 2 weeks.  Diagnosis: Axis I: Major Depression, Recurrent moderate     Axis II: No diagnosis    Alizabeth Antonio, LPC 11/05/2011

## 2011-11-12 ENCOUNTER — Ambulatory Visit (HOSPITAL_COMMUNITY): Payer: Self-pay | Admitting: Psychiatry

## 2011-11-13 ENCOUNTER — Other Ambulatory Visit (HOSPITAL_COMMUNITY): Payer: Self-pay | Admitting: Psychiatry

## 2011-11-14 ENCOUNTER — Other Ambulatory Visit (HOSPITAL_COMMUNITY): Payer: Self-pay | Admitting: Psychiatry

## 2011-11-19 ENCOUNTER — Encounter (HOSPITAL_COMMUNITY): Payer: Self-pay | Admitting: Psychiatry

## 2011-11-19 ENCOUNTER — Ambulatory Visit (INDEPENDENT_AMBULATORY_CARE_PROVIDER_SITE_OTHER): Payer: PRIVATE HEALTH INSURANCE | Admitting: Psychiatry

## 2011-11-19 ENCOUNTER — Ambulatory Visit (INDEPENDENT_AMBULATORY_CARE_PROVIDER_SITE_OTHER): Payer: PRIVATE HEALTH INSURANCE | Admitting: Psychology

## 2011-11-19 VITALS — Wt 224.0 lb

## 2011-11-19 DIAGNOSIS — F332 Major depressive disorder, recurrent severe without psychotic features: Secondary | ICD-10-CM

## 2011-11-19 DIAGNOSIS — F323 Major depressive disorder, single episode, severe with psychotic features: Secondary | ICD-10-CM

## 2011-11-19 DIAGNOSIS — F329 Major depressive disorder, single episode, unspecified: Secondary | ICD-10-CM

## 2011-11-19 DIAGNOSIS — F4310 Post-traumatic stress disorder, unspecified: Secondary | ICD-10-CM

## 2011-11-19 MED ORDER — HALOPERIDOL 2 MG PO TABS: 2.0000 mg | ORAL_TABLET | Freq: Every day | ORAL | Status: DC
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
## 2011-11-19 NOTE — Progress Notes (Signed)
   THERAPIST PROGRESS NOTE  Session Time: 9:40am-10:30am  Participation Level: Active  Behavioral Response: Well GroomedAlertDepressed  Type of Therapy: Individual Therapy  Treatment Goals addressed: Diagnosis: MDD and goal 1.  Interventions: CBT and Supportive  Summary: Shakelia Scrivner is a 31 y.o. female who presents with reports of extreme fatigue and reported depressed mood.  Pt reported that she learned her iron is low when trying to donate blood.  Pt expressed discouraged about recovery process and taking meds.  She reports her friend is coming for support for her today.  Her friend arrive 30 min into session.  Pt expressed how she doesn't feel like making progress and tired of feeling this way.  Friend was supportive, acknowledged her feelings, encouraged pt that she is used to being in control and expectations unrealistic w/ timeframe will take for recovery.  Pt insight into negative self talk, cognitive distortions- difficulty in re framing for self.  Friend was able to reflect progress pt has made and encouraging to focus on day to day, acknowledging positives.   Suicidal/Homicidal: Nowithout intent/plan  Therapist Response: Assessed pt current functioning per pt and friend report.  Counselor f/u w/ pt to assure pt wanted friend present in session.  Reflected to pt cognitive distortions and assisting in re framing.  Challenged pt w/ positives that have occurred this week and pt value despite a mental health dx.  Discussed symptoms of depression and recovery as a process. Encourage pt to use belief of self fulfilling prophecy to encourage positive self talk and recognition.   Plan: Return again in 1-2 weeks.  Diagnosis: Axis I: Major Depression, Recurrent severe    Axis II: No diagnosis    Donshay Lupinski, LPC 11/19/2011

## 2011-11-22 ENCOUNTER — Other Ambulatory Visit (HOSPITAL_COMMUNITY): Payer: Self-pay | Admitting: Psychiatry

## 2011-11-22 DIAGNOSIS — F329 Major depressive disorder, single episode, unspecified: Secondary | ICD-10-CM

## 2011-11-22 MED ORDER — HALOPERIDOL 2 MG PO TABS: 2.0000 mg | ORAL_TABLET | Freq: Every day | ORAL | Status: DC

## 2011-11-29 ENCOUNTER — Ambulatory Visit (INDEPENDENT_AMBULATORY_CARE_PROVIDER_SITE_OTHER): Payer: Self-pay | Admitting: Family Medicine

## 2011-11-29 ENCOUNTER — Encounter: Payer: Self-pay | Admitting: Family Medicine

## 2011-11-29 VITALS — BP 110/72 | HR 90 | Temp 98.2°F | Ht 67.0 in | Wt 221.2 lb

## 2011-11-29 DIAGNOSIS — E669 Obesity, unspecified: Secondary | ICD-10-CM

## 2011-11-29 DIAGNOSIS — F332 Major depressive disorder, recurrent severe without psychotic features: Secondary | ICD-10-CM

## 2011-11-29 DIAGNOSIS — Z299 Encounter for prophylactic measures, unspecified: Secondary | ICD-10-CM

## 2011-11-29 DIAGNOSIS — N643 Galactorrhea not associated with childbirth: Secondary | ICD-10-CM

## 2011-11-29 DIAGNOSIS — E039 Hypothyroidism, unspecified: Secondary | ICD-10-CM | POA: Insufficient documentation

## 2011-11-29 LAB — BASIC METABOLIC PANEL
BUN: 6 mg/dL (ref 6–23)
Chloride: 103 mEq/L (ref 96–112)
Glucose, Bld: 80 mg/dL (ref 70–99)
Potassium: 3.9 mEq/L (ref 3.5–5.3)

## 2011-11-29 NOTE — Progress Notes (Signed)
  Subjective:    Patient ID: Colleen Peck, female    DOB: 26-Feb-1981, 31 y.o.   MRN: [id number]  HPI 9 you female here for physical and with following concerns: Discharge from both breasts since high school.  Not currently sexually active.  H/o pregnancy x 1 years ago (TAB).  + h/o nipple piercing.  Is on antipsychotic, but has only recently started that.  No change in discharge for years.  Occurs when she squeezes breasts. Obesity - would very much like to try weight loss pills. She has friends who have tried them with success.  She cannot afford weight watchers, has met with nutrition.  Feels she knows what she should do.  She reports no appetite.  She eats 1 meal a day.   Hypothyroid- takes 150 levothyroxine daily.  She rarely misses doses.  She feels tired, no appetite, weight gain, constipation.  No hair/skin changes.  Last TSH approx 1 month ago- normal.   Depressive disorder - still wakes up crying.  Sees counselor, psychiatrist.  Takes meds regularly.   Denies chest pain, breathing difficulties, menstrual problems.  See SH/FH/meds and allergies for updates.  Review of Systems See HPI     Objective:   Physical Exam  Nursing note and vitals reviewed. Constitutional: She appears well-developed and well-nourished. No distress.       Obese.  HENT:  Head: Normocephalic and atraumatic.  Right Ear: External ear normal.  Left Ear: External ear normal.  Nose: Nose normal.  Mouth/Throat: Oropharynx is clear and moist. No oropharyngeal exudate.  Eyes: Conjunctivae are normal. Right eye exhibits no discharge. Left eye exhibits no discharge. No scleral icterus.  Neck: No tracheal deviation present. No thyromegaly present.  Pulmonary/Chest:       Breast exam: No axillary LAD.  No nipple discharge noted.  +mild fibrocystic type changes, no discrete mass.    Lymphadenopathy:    She has no cervical adenopathy.  Skin: She is not diaphoretic.  Psychiatric:       Normal grooming and dress.   Intense eye contact.  Pleasant and cooperative.  Non labile.  No FOI or LOA.  Nl speech with nl. TC and TP.            Assessment & Plan:

## 2011-11-29 NOTE — Patient Instructions (Signed)
It was nice to meet you today. We should do a pap in about 2 years.  We will check some lab work today. Please come back and see me in 6 months, or sooner if you have any concerns.

## 2011-11-29 NOTE — Assessment & Plan Note (Signed)
Check TSH today. Last normal per pt, but certainly symptomatic.  Continue current dose.

## 2011-11-30 ENCOUNTER — Encounter: Payer: Self-pay | Admitting: Family Medicine

## 2011-11-30 DIAGNOSIS — Z299 Encounter for prophylactic measures, unspecified: Secondary | ICD-10-CM | POA: Insufficient documentation

## 2011-11-30 NOTE — Assessment & Plan Note (Signed)
Lengthy discussion of weight loss.  Pt very interested in meds to "get her started", but we discussed that these have side effects and are not a long term solution.  Instead, suggested nutrition and exercise.  Handouts given.

## 2011-11-30 NOTE — Assessment & Plan Note (Signed)
Will check prolactin, TSH today.  Pt on antipsychotics, but just started the Haldol a few weeks ago.  Galactorrhea stable for many years per pt.

## 2011-11-30 NOTE — Assessment & Plan Note (Addendum)
Followed by psych.  Currently stable.

## 2011-12-14 ENCOUNTER — Ambulatory Visit (HOSPITAL_COMMUNITY)
Admission: RE | Admit: 2011-12-14 | Discharge: 2011-12-14 | Disposition: A | Discharge status: Home / Self Care | Payer: Medicaid Other | Attending: Psychiatry | Admitting: Psychiatry

## 2011-12-14 ENCOUNTER — Ambulatory Visit (INDEPENDENT_AMBULATORY_CARE_PROVIDER_SITE_OTHER): Payer: Medicaid Other | Admitting: Psychology

## 2011-12-14 DIAGNOSIS — R87619 Unspecified abnormal cytological findings in specimens from cervix uteri: Secondary | ICD-10-CM | POA: Insufficient documentation

## 2011-12-14 DIAGNOSIS — Z833 Family history of diabetes mellitus: Secondary | ICD-10-CM | POA: Insufficient documentation

## 2011-12-14 DIAGNOSIS — J45909 Unspecified asthma, uncomplicated: Secondary | ICD-10-CM | POA: Insufficient documentation

## 2011-12-14 DIAGNOSIS — F329 Major depressive disorder, single episode, unspecified: Secondary | ICD-10-CM | POA: Insufficient documentation

## 2011-12-14 DIAGNOSIS — F3289 Other specified depressive episodes: Secondary | ICD-10-CM | POA: Insufficient documentation

## 2011-12-14 DIAGNOSIS — Z5987 Material hardship due to limited financial resources, not elsewhere classified: Secondary | ICD-10-CM | POA: Insufficient documentation

## 2011-12-14 DIAGNOSIS — Z598 Other problems related to housing and economic circumstances: Secondary | ICD-10-CM | POA: Insufficient documentation

## 2011-12-14 DIAGNOSIS — Z818 Family history of other mental and behavioral disorders: Secondary | ICD-10-CM | POA: Insufficient documentation

## 2011-12-14 DIAGNOSIS — F4310 Post-traumatic stress disorder, unspecified: Secondary | ICD-10-CM | POA: Insufficient documentation

## 2011-12-14 DIAGNOSIS — E039 Hypothyroidism, unspecified: Secondary | ICD-10-CM | POA: Insufficient documentation

## 2011-12-14 DIAGNOSIS — Z7289 Other problems related to lifestyle: Secondary | ICD-10-CM | POA: Insufficient documentation

## 2011-12-14 DIAGNOSIS — Z8249 Family history of ischemic heart disease and other diseases of the circulatory system: Secondary | ICD-10-CM | POA: Insufficient documentation

## 2011-12-14 DIAGNOSIS — F332 Major depressive disorder, recurrent severe without psychotic features: Secondary | ICD-10-CM

## 2011-12-14 NOTE — Progress Notes (Signed)
   THERAPIST PROGRESS NOTE  Session Time: 2:05pm-3pm  Participation Level: Active  Behavioral Response: Well GroomedAlertDepressed  Type of Therapy: Individual Therapy  Treatment Goals addressed: Diagnosis: MDD and goal 1.  Interventions: CBT, Strength-based and Supportive  Summary: Shalaya Swailes is a 31 y.o. female who presents with depressed mood and affect- tearful in session.  Pt reports that she feels that she is not being honest in tx as some denial that if she acknowledges depression "speaks it" then she wills it on herself.  Pt reports feeling hopeless for improvement and speaks negatively about herself in session- expressing as failure for not achieving more and comparing and putting herself down.  Pt reported although friends report she is improving and needs to give herself time she doesn't feel improved inside instead more hopeless.  Pt was able to identify cogntive distortions, challenge these brief w/ counselor support but would cycle back to negative self talk.  Pt denied any SI, reported at times wishing God would take her to be w/ him, but aware this is not the answer.  Pt agreed to seek more intensive tx w/ IOP and went to [facility] assessment to begin process.  Suicidal/Homicidal: Nowithout intent/plan  Therapist Response: Assessed pt current functioning per pt report.  Processed w/ pt symptoms of depression and reflected negative cognitive distortions and negative self talk cycle in session.  Assisted pt in reframing.  Discussed referral to IOP and encouraged as appropriate level of care.  Plan: complete [facility] assessment for IOP.  Diagnosis: Axis I: Major Depression, Recurrent severe    Axis II: No diagnosis    Argusta Mcgann, LPC 12/14/2011
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
## 2011-12-16 ENCOUNTER — Other Ambulatory Visit (HOSPITAL_COMMUNITY): Payer: Self-pay | Admitting: Psychiatry

## 2011-12-20 ENCOUNTER — Encounter (HOSPITAL_COMMUNITY): Payer: Self-pay

## 2011-12-20 ENCOUNTER — Other Ambulatory Visit (HOSPITAL_COMMUNITY): Payer: Medicaid Other | Attending: Psychiatry

## 2011-12-20 ENCOUNTER — Ambulatory Visit (HOSPITAL_COMMUNITY): Payer: Self-pay | Admitting: Psychiatry

## 2011-12-20 DIAGNOSIS — J45909 Unspecified asthma, uncomplicated: Secondary | ICD-10-CM | POA: Insufficient documentation

## 2011-12-20 DIAGNOSIS — F4310 Post-traumatic stress disorder, unspecified: Secondary | ICD-10-CM | POA: Insufficient documentation

## 2011-12-20 DIAGNOSIS — F411 Generalized anxiety disorder: Secondary | ICD-10-CM | POA: Insufficient documentation

## 2011-12-20 DIAGNOSIS — F259 Schizoaffective disorder, unspecified: Secondary | ICD-10-CM | POA: Insufficient documentation

## 2011-12-20 DIAGNOSIS — F329 Major depressive disorder, single episode, unspecified: Secondary | ICD-10-CM

## 2011-12-20 DIAGNOSIS — F419 Anxiety disorder, unspecified: Secondary | ICD-10-CM

## 2011-12-20 DIAGNOSIS — E039 Hypothyroidism, unspecified: Secondary | ICD-10-CM | POA: Insufficient documentation

## 2011-12-20 MED ORDER — HALOPERIDOL 2 MG PO TABS
4.0000 mg | ORAL_TABLET | Freq: Every day | ORAL | Status: DC
Start: 2011-12-20 — End: 2012-01-21

## 2011-12-20 MED ORDER — FLUOXETINE HCL 10 MG PO CAPS: 20.0000 mg | ORAL_CAPSULE | Freq: Every day | ORAL | Status: DC

## 2011-12-20 NOTE — Progress Notes (Signed)
Patient ID: Colleen Peck, female   DOB: [birth year], 31 y.o.   MRN: [id number] D:  This is a 51 single african [place] female, who was referred by her therapist [name], [facility]), treatment for worsening depressive symptoms with SI.  States she really didn't have a plan, but if she would ever do anything it would be "take pills."  Discussed safety options with pt, and she is able to contract for safety.  Reports worsening symptoms (ie. Crying spells, SI, decreased appetite, sadness, worrying, feelings of hopelessness and helplessness) for months.  Also admits to paranoia.  Patient apparently has been self-medicating.  She informed staff that she increased the Haldol from 2 mg to 4 mg without consulting with Dr. Lolly Mustache. Patient's most recent admit in [facility] was 05-14-11 due to paranoid/delusional thinking.   CC: previous chart for more history. 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  States she doesn't feel that working two jobs will be overwhelming.  "I need to make more money because I want a place of my own."  Pt continues to reside with her mother and sister.  A:  Re-oriented pt.  Informed Dr. Lolly Mustache and [name], [facility] of admit.  Patient's weight today at 1100 is 219.8.  Encourage support groups.  Provide pt with an orientation folder.  R:  Pt receptive.
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
## 2011-12-20 NOTE — Progress Notes (Signed)
    Daily Group Progress Note  Program: IOP  Group Time: 9:00-10:30 am   Participation Level: Active  Behavioral Response: Appropriate  Type of Therapy:  Process Group  Summary of Progress: Today was patients first day in the group. She was introduced and immediatly connected with the other group members. She was tearful as she talked about feeling depressed since this past September and feeling some level of hopelessness about feeling better and about getting to where she wants to be in her life. She described not having the career she wishes she had, wanting to be married and have children which are all contributing to feelings of depression.      Group Time: 10:30 am - 12:00 pm   Participation Level:  Active  Behavioral Response: Appropriate  Type of Therapy: Grief and Loss  Summary of Progress: Patient participated in a process group on grief and loss by Lennox Laity with the counseling department.  Maxcine Ham, MSW, LCSW

## 2011-12-21 ENCOUNTER — Other Ambulatory Visit (HOSPITAL_COMMUNITY): Payer: Medicaid Other

## 2011-12-21 NOTE — Progress Notes (Signed)
    Daily Group Progress Note  Program: IOP  Group Time: 9:00-10:30 am   Participation Level: Active  Behavioral Response: Appropriate  Type of Therapy:  Process Group  Summary of Progress: Patient reported feeling "mild depression" and talked about her difficulty accepting her depression. She questioned why she became depression and wondered if it was a spiritual event. She dicussed how she had a psychotic episode last September that included hearing and seeing things that were not there which resulted in her first and only inpatient hospitalization. She questions why she had this happen and is disappointed with where she is in life with her career and personal life.      Group Time: 10:30 am - 12:00 pm   Participation Level:  Active  Behavioral Response: Appropriate  Type of Therapy: Psycho-education Group  Summary of Progress: Patient learned about the symptoms of depression and anxiety as a medical condition and learned how to identify returning symptoms.   Maxcine Ham, MSW, LCSW

## 2011-12-22 ENCOUNTER — Other Ambulatory Visit (HOSPITAL_COMMUNITY): Payer: Medicaid Other

## 2011-12-22 DIAGNOSIS — F332 Major depressive disorder, recurrent severe without psychotic features: Secondary | ICD-10-CM

## 2011-12-22 NOTE — Progress Notes (Signed)
    Daily Group Progress Note  Program: IOP  Group Time: 0900 - 1030  Participation Level: Active  Behavioral Response: Appropriate, Sharing and Assertive  Type of Therapy:  Process Group  Summary of Progress: We started with a 3 minute video of Duaine Dredge talking about the human tendency to look for the solution to our internal problems outside of ourselves.  Colleen Peck reported feeling very down today, "tired of feeling depressed".  She talked at length about her disappointment in herself that she is not farther along toward her goals.  We pointed out her repeated statements that "At this age (nearly 55), I should be settled in my career.  I should be independent of my family and living on my own."  The group was supportive of her desire to get more education and told her about vocational rehab, making sure she got a pamphlet.  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  Her comments led to good discussion by the group about their feelings regarding the diagnosis of depression.   Group Time: 1045 - 1200  Participation Level:  Active  Behavioral Response: Appropriate, Sharing, Motivated and Assertive  Type of Therapy: Psycho-education Group  Summary of Progress: Viewed The Tapping Solution DVD,  Talked about the process of using meridian tapping to deal with emotionally charged issues, anxiety or pain.  We practiced using the tapping points and did a full round of tapping on the topic of "I hate being depressed".  Everyone participated and reported feeling calmer after this round.      Shonna Chock, APRN, CNS   [facility]

## 2011-12-23 ENCOUNTER — Other Ambulatory Visit (HOSPITAL_COMMUNITY): Payer: Medicaid Other

## 2011-12-23 NOTE — Progress Notes (Signed)
    Daily Group Progress Note  Program: IOP  Group Time: 9:00-10:30 am   Participation Level: Active  Behavioral Response: Appropriate  Type of Therapy:  Process Group  Summary of Progress: Patient expressed uncertainty about accepting having a mental illness and the stigma associated with it. She discussed how her family does not support mental illness and believes you should use your faith to overcome it. She is uncertain how she will accept her condition, but is aware her lack of acceptance is preventing her from healing. She states she has active suicidal thoughts, but contracts for safety.      Group Time: 10:30 am - 12:00 pm   Participation Level:  Active  Behavioral Response: Appropriate  Type of Therapy: Psycho-education Group  Summary of Progress: Patient was educated on mental illness as a medical condition and informed about the symptoms associated with Bipolar Disorder.   Maxcine Ham, MSW, LCSW

## 2011-12-24 ENCOUNTER — Other Ambulatory Visit (HOSPITAL_COMMUNITY): Payer: Medicaid Other

## 2011-12-24 NOTE — Progress Notes (Signed)
    Daily Group Progress Note  Program: IOP  Group Time: 9:00-10:30 am   Participation Level: Active  Behavioral Response: Appropriate  Type of Therapy:  Process Group  Summary of Progress: Patient is distracted and struggled to be present with the group. She reports focusing on her stressful thoughts about why she cant get over her depression, instead of being present with the other group members. She was tearful as she described wanting to feel happy and her struggle between believing her faith can heal her depression vs working with the doctors. She is still trying to accept the fact that she has a medical condition.      Group Time: 10:30 am - 12:00 pm   Participation Level:  Active  Behavioral Response: Appropriate  Type of Therapy: Psycho-education Group  Summary of Progress: Patient participated in a discussion on how to maintain mental health wellness after ending the group.   Maxcine Ham, MSW, LCSW

## 2011-12-27 ENCOUNTER — Other Ambulatory Visit (HOSPITAL_COMMUNITY): Payer: Medicaid Other

## 2011-12-27 NOTE — Progress Notes (Signed)
    Daily Group Progress Note  Program: IOP  Group Time: 9:00-10:30 am   Participation Level: Active  Behavioral Response: Appropriate  Type of Therapy:  Process Group  Summary of Progress: Patient arrived a few minutes late and apologized. She states she worked third shift last night and fell asleep this morning before group. She states she feels "happier" after Fridays group because she started taking personal responsibility for her depression. She said she went for a walk, made time for herself and took her medications. She was smiling today and talked about how she reflected back on how she was "waiting for a miracle" to heal her from her depression and now she is aware that she needs to use her faith, but also work towards her own personal wellness as well.      Group Time: 10:30 am - 12:00 pm   Participation Level:  Active  Behavioral Response: Appropriate  Type of Therapy: Grief and Loss  Summary of Progress: Patient participated in a loss group facilitated by the hospital chaplin and identified personal losses impacting wellness.   Maxcine Ham, MSW, LCSW

## 2011-12-28 ENCOUNTER — Other Ambulatory Visit (HOSPITAL_COMMUNITY): Payer: Medicaid Other

## 2011-12-28 NOTE — Progress Notes (Unsigned)
    Daily Group Progress Note  Program: IOP  Group Time: 9:00-10:30 am   Participation Level: Active  Behavioral Response: Appropriate  Type of Therapy:  Process Group  Summary of Progress: Patient reports continuing to feel "stuck" in not being happy with where she is in her career. She is uncertain what she wants to do and feels hopeless. Members expressed frustration that patient is not taking an active role in trying to get out of her situation. She appears "helpless" and yet wants answers. Members gave her information on Vocational Rehabilitation last week and asked her why she has not called to set up an appointment to explore career opportunities. Member could not explain why she has not called and then wanted to talk mor about how how she feels stuck with her career. She lacks insight into how she is contributing to her own depression and stuck feelings.      Group Time: 10:30 am - 12:00 pm  Participation Level:  Active  Behavioral Response: Appropriate  Type of Therapy: Psycho-education Group  Summary of Progress: Patient participated in a mindfulness activity to help learn how to be in the present moment.   Maxcine Ham, MSW, LCSW

## 2011-12-29 ENCOUNTER — Other Ambulatory Visit (HOSPITAL_COMMUNITY): Payer: Medicaid Other

## 2011-12-29 DIAGNOSIS — F332 Major depressive disorder, recurrent severe without psychotic features: Secondary | ICD-10-CM

## 2011-12-29 NOTE — Progress Notes (Signed)
    Daily Group Progress Note  Program: IOP  Group Time: 0900-1030  Participation Level: Active  Behavioral Response: Appropriate and Sharing  Type of Therapy:  Process Group  Summary of Progress: Colleen Peck reported feeling more depressed today.  She mentioned a dream she had had that might have impacted her mood, but elected not to share the dream.  As the session progressed, several topics that came up caught her interest and she talked about how afraid she was when she was in the inpatient setting.  I asked her to try to imagine how it would be to feel better, and she declared that she couldn't even imagine that.  She agreed to play a game on that theme and so we imagined that a miracle during the night corrected her depressed mood.  Then I asked her to imagine what her family would see in her that would show them that something had changed.  She was able to imagine what they might see, more easily than what she might notice.  Therefore, i gave her the homework to start noticing any tiny amount of pleasure or positive feeling she has between today and tomorrow.  She agreed to accept this assignment.  She did report doing her homework from yesterday, calling Vocational Rehab. To initiate getting an appointment with them for evaluation.    Group Time: 1045 - 1200  Participation Level:  Active  Behavioral Response: Appropriate, Sharing and Motivated  Type of Therapy: Psycho-education Group  Summary of Progress: Topic:  Difficult conversations     Goodbyes were the first difficult conversations looked at as we participated in saying goodbye to two members of the group.  Each person then recounted what a difficult conversation meant to them, some revealing very personal events in their past.  We did not get to the point of practicing skills for difficult conversations that had been the goal in this topic, but the group was brought closer together by the conversation as it evolved in the  room.     Shonna Chock, APRN, CNS [facility]

## 2011-12-29 NOTE — Progress Notes (Unsigned)
    Daily Group Progress Note  Program: {[facility] AMB [facility] IOP/CDIOP Program Type:21022744}  Group Time:   Participation Level: {CHL AMB [facility] Group Participation:21022742}  Behavioral Response: {CHL AM[facility] Group Behavior:21022743}  Type of Therapy:  {CHL AMB [facility] Type of Therapy:21022741}  Summary of Progress: ***     Group Time:   Participation Level:  {CHL AMB [facility] Group Participation:21022742}  Behavioral Response: {CHL AMB BH Group Behavior:21022743}  Type of Therapy: {CHL AMB BH Type of Therapy:21022741}  Summary of Progress: ***  [facility] 

## 2011-12-30 ENCOUNTER — Other Ambulatory Visit (HOSPITAL_COMMUNITY): Payer: Medicaid Other

## 2011-12-30 NOTE — Progress Notes (Signed)
    Daily Group Progress Note  Program: IOP  Group Time: 9:00-10:30 am   Participation Level: Active  Behavioral Response: Appropriate  Type of Therapy:  Process Group  Summary of Progress: Patient reports high depression again today and is flipping back and forth between optimism and hopelessness. She opened up more today about her stressors and states she is renting an apartment with her mom and sister, but a total of ten people live with her and she does not have a room of her own and sleeps on the couch. She is unrealistic about how to get out of the situation and get a place of her own in that she wants to go back to college and get a career that pays her a lot of money. She struggles to see how this is a long term goal, not a short term solution. In the meantime her depression is worsening over her current housing situation.      Group Time: 10:30 am - 12:00 pm   Participation Level:  Active  Behavioral Response: Appropriate  Type of Therapy: Psycho-education Group  Summary of Progress: Patient learned the DBT skill of how to distract from life stressors that can not be  Immediately changed by using the skill of ACCEPTS.   Maxcine Ham, MSW, LCSW

## 2011-12-30 NOTE — Progress Notes (Signed)
Patient ID: Colleen Peck, female   DOB: 1981/03/27, 31 y.o.   MRN: [id number] Pt seen with Jeri Modena, pt tearful feels overwhelmed, discussed med compliance and pt stated she was taking everything except, the Klonopin which she takes as needed , despite the fact that it q hs. Discussed taking 1 mg klonopin tonite, and the  0.5 mg po q hs , Pt stated understanding. Denies Suicidal ideation mood-anxious and depressed. No hallucinations/ delusions Cont other meds

## 2011-12-31 ENCOUNTER — Other Ambulatory Visit (HOSPITAL_COMMUNITY): Payer: Medicaid Other

## 2011-12-31 NOTE — Progress Notes (Signed)
    Daily Group Progress Note  Program: IOP  Group Time: 9:00-10:30 am   Participation Level: Active  Behavioral Response: Appropriate  Type of Therapy:  Process Group  Summary of Progress: Patient continues to be tearful, depressed and feel hopeless. She goes back and forth between talking about her frustration with her career and housing being inadequate and not accepting her mental illness or depression. Today she was focused on not accepting her mental health condition. She states she questions it and needs answers as to why she has it. She has been educated on depression as a medical health condition several times, but struggles between her families negative stigma about it and what her doctors tell her. This non acceptance is preventing her from making progress with her depression. She states she has one family member who accepts mental health and she was challenged to talk with her about how she has grown to accept it within her family.      Group Time: 10:30 am - 12:00 pm   Participation Level:  Active  Behavioral Response: Appropriate  Type of Therapy: Psycho-education Group  Summary of Progress: Patient learned about anxiety, the causes and how to use a breathing exercise to manage it.   Maxcine Ham, MSW, LCSW

## 2012-01-03 ENCOUNTER — Other Ambulatory Visit (HOSPITAL_COMMUNITY): Payer: Medicaid Other

## 2012-01-03 NOTE — Progress Notes (Signed)
    Daily Group Progress Note  Program: IOP  Group Time: 9:00-10:30 am   Participation Level: Active  Behavioral Response: Appropriate  Type of Therapy:  Process Group  Summary of Progress: Patient continues to report "feeling stuck" in her depression and with "life in general". She gives examples of things she is not happy with in life, but does not accept any solutions to improve her situation. Members expressed their frustration with patients inability to move towards change. Validated patients feelings of hopelessness and feeling stuck. Used motivational interviewing to assess where she is in the change cycle. She expressed frustration with how she used to be motivated and make decisions easily and now she is unable to do so.      Group Time: 10:30 am - 12:00 pm   Participation Level:  Active  Behavioral Response: Appropriate  Type of Therapy: Process Group  Summary of Progress: Patient participated in a grief and loss group and identified losses and learned ways of grieving.   Maxcine Ham, MSW, LCSW

## 2012-01-04 ENCOUNTER — Other Ambulatory Visit (HOSPITAL_COMMUNITY): Payer: Medicaid Other

## 2012-01-04 MED ORDER — FLUOXETINE HCL 20 MG PO CAPS: 20.0000 mg | ORAL_CAPSULE | Freq: Every day | ORAL | Status: DC

## 2012-01-04 NOTE — Progress Notes (Signed)
Patient ID: Colleen Peck, female   DOB: Oct 31, 1980, 31 y.o.   MRN: [id number] Pt seen with Jeri Modena, anxiety better, sleep improved, no si/hi. No paranoia , tol meds well.

## 2012-01-05 ENCOUNTER — Other Ambulatory Visit (HOSPITAL_COMMUNITY): Payer: Medicaid Other

## 2012-01-05 NOTE — Progress Notes (Signed)
      Colleen Peck   01/05/2012 9:00 AM Counselor  MRN: [id number]   Description: 31 year old female  Provider: [facility]  Department: [facility]        Diagnoses     Major depressive disorder, recurrent, severe without psychotic features   - Primary    296.33    Generalized anxiety disorder     300.02      Reason for Visit     Depression    Anxiety    Agitation    Stress    Trauma          Progress Notes     Colleen Colasurdo, RN  01/05/2012 12:00 PM  Signed **Sensitive Note**       Daily Group Progress Note   Program: IOP   Group Time: 0900 - 1030   Participation Level: active  Behavioral Response: sharing appropriately  Type of Therapy:  Process Group   Summary of Progress: Colleen Peck reported being neither very up or down today.  She said she had done her homework of self-care activity by taking a nap.  She joined in the discussion about the experience of grief and the difficulty processing her feelings when she had several deaths in her family back to back.  This resulted in her first hospitalization.  She also related to the discussion about college, going to please others and not really knowing what she wanted to do with her education.  She was appropriate in her sharing and her experience was a valuable addition to the group.        Group Time: 1045 - 1200   Participation Level:  Minimal   Behavioral Response: Appropriate and Sharing   Type of Therapy: Psycho-education Group   Summary of Progress: Group concerned making S.M.A.R.T. Goals, using a handout by that name.  Examples were discussed and changed to fit the criteria with ideas provided by group members.  Colleen Nian, APRN, CNS   [facility] Colleen Meres, RN  01/05/2012 12:01 PM  Pended **Sensitive Note**                            Referring Provider     Colleen Housekeeper, MD          Other Encounter Related Information       Allergies & Medications         Problem List         History         Patient-Entered Questionnaires   Printed AVS Reports     No AVS reports have been printed for this encounter.        No data filed

## 2012-01-06 ENCOUNTER — Other Ambulatory Visit (HOSPITAL_COMMUNITY): Payer: Medicaid Other

## 2012-01-06 NOTE — Progress Notes (Signed)
    Daily Group Progress Note  Program: IOP  Group Time: 9:00-10:30 am   Participation Level: Active  Behavioral Response: Appropriate  Type of Therapy:  Process Group  Summary of Progress: Patient reports not making any progress and not knowing how to decrease her depression. She was not paying attention during the group and was looking around the room and appeared distracted. Members expressed their frustration with her saying she can't fix her depression but not taking an active role in participating.      Group Time: 10:30 am - 12:00 pm   Participation Level:  Active  Behavioral Response: Appropriate  Type of Therapy: Psycho-education Group  Summary of Progress:  Patient participated in a relaxation exercise using Progressive Muscle Relaxation as a tool to manage anxiety symptoms.   Maxcine Ham, MSW, LCSW

## 2012-01-06 NOTE — Progress Notes (Signed)
    Daily Group Progress Note  Program: IOP  Group Time: 9:00-10:30 am   Participation Level: Active  Behavioral Response: Appropriate  Type of Therapy:  Process Group  Summary of Progress: Patient reports feeling hopeless and depressed with little improvement.      Group Time: 10:30 am - 12:00 pm   Participation Level:  Active  Behavioral Response: Appropriate  Type of Therapy: Psycho-education Group  Summary of Progress: Patient learned about depression and symptoms and triggers.  Maxcine Ham, MSW, LCSW

## 2012-01-07 ENCOUNTER — Telehealth (HOSPITAL_COMMUNITY): Payer: Self-pay | Admitting: Psychiatry

## 2012-01-07 ENCOUNTER — Other Ambulatory Visit (HOSPITAL_COMMUNITY): Payer: Medicaid Other

## 2012-01-07 NOTE — Progress Notes (Signed)
    Daily Group Progress Note  Program: IOP  Group Time: 9:00-10:30 am   Participation Level: Active  Behavioral Response: Appropriate  Type of Therapy:  Process Group  Summary of Progress: Patient shared current depression symptoms and shared progress they feel they are making so far in the program and received support from other members.        Group Time: 10:30 am - 12:00 pm   Participation Level:  Active  Behavioral Response: Appropriate  Type of Therapy: Psycho-education Group  Summary of Progress:   Patient participated in a goodbye ceremony for two members ending the program and expressed hopes for them going forward.   Giannis Corpuz, MSW, LCSW  

## 2012-01-07 NOTE — Patient Instructions (Signed)
Patient completed MH-IOP today.  Denies any suicidal thoughts.  Discussed following up with [facility] [phone]), since they have various resources (ie. Case management, medication assistance, etc).  While awaiting appointment for [facility], pt will follow up with current therapist [name], [facility]) and Dr. Lolly Mustache.  Encouraged support groups.

## 2012-01-07 NOTE — Progress Notes (Signed)
Patient ID: Colleen Peck, female   DOB: 08/24/1980, 31 y.o.   MRN: [id number] D:  This is a 59 single african [place] female who was referred by her therapist [name], [facility]), treatment for worsening depressive symptoms with SI.  Reports not feeling any better (ie. Crying spells, decreased appetite, restless sleep, sadness, and feeling hopeless/helpless.)  Denies any paranoia or A/V hallucinations.   States that the [employer] (meditation) was helpful in the groups.  A:  D/C today.  Writer has been Civil [employer] fast streamer with Vernona Rieger at [place] [phone]).  Will continue to reach Vernona Rieger in order to transition pt over to [place], but in the meantime, pt will follow up with current therapist [name], [facility] on 01-14-12 @ 9:30 am and Dr. Lolly Mustache on 01-21-12 @ 9a.m.  Encouraged support groups. Discussed safety options, if pt was in crisis at any time.  R:  Pt receptive.

## 2012-01-07 NOTE — Progress Notes (Signed)
Patient ID: Colleen Peck, female   DOB: 07-Jun-1981, 31 y.o.   MRN: [id number] A:  Attempted to reach patient [phone]), but there was no answer.  Will inform [name], LPC.

## 2012-01-07 NOTE — Progress Notes (Signed)
[facility] Intensive Outpatient Program Discharge Summary  Jora Galluzzo [id number]  Discharge Note  Patient:  Colleen Peck is Peck 31 y.o., female DOB:  [birth year]  Date of Admission:  12-20-11  Date of Discharge:  01-07-12  Reason for Admission: 31 year old African American female admitted for worsening depression and paranoia.  Hospital Course: Patient began IOP A. and continued to state that she was depressed. She was continued on her home medications of Haldol 4 mg at bedtime, Wellbutrin XL 300 mg every morning and Klonopin 0.5 mg at bedtime. Because of her constant rumination and obsessing about the fact that she was not getting better and her depression Prozac 20 mg was added to the medication reaching. She attended groupS, . and learnt coping skills in the group and felt that this was helpful. She continued to have unrealistic expectations of herself wanting to pick up a second job and also wanting to go to school. Patient was asked to go slowly with her future plans but patient did not want to do so. In the group it was felt that she tended to play the victim role and did not follow through with her treatment recommendations. She was tolerating her medications well. Sleep and appetite were good her crying spells had dissipated and she no longer had thoughts of suicide and was not paranoid.  It was a recommendation that she followup at [facility] in order for her to get a case manager as this would be helpful given her current financial and social situation.  Mental Status at Discharge: Alert, oriented x3, affect was constricted mood was 8 she is regarding discharge. Speech was normal, no suicidal or homicidal ideation was present. No hallucinations or delusions were present.  Recent and remote memory was good, judgment was fair, insight was shallow. Concentration and recall were fair.  Lab Results: No results found for this or any previous visit  (from the past 48 hour(s)).  Current outpatient prescriptions:albuterol (PROVENTIL HFA) 108 (90 BASE) MCG/ACT inhaler, Inhale 2 puffs into the lungs every 6 (six) hours as needed. For shortness of breath, wheezing., Disp: , Rfl: ;  albuterol (PROVENTIL) (2.5 MG/3ML) 0.083% nebulizer solution, Take 2.5 mg by nebulization every 6 (six) hours as needed. For shortness of breath , Disp: , Rfl:  buPROPion (WELLBUTRIN XL) 300 MG 24 hr tablet, TAKE ONE TABLET BY MOUTH IN THE MORNING, Disp: 30 tablet, Rfl: 0;  cetirizine (ZYRTEC) 10 MG tablet, Take 10 mg by mouth daily.  , Disp: , Rfl: ;  clonazePAM (KLONOPIN) 0.5 MG tablet, Take 0.5 mg by mouth daily. From psych, Disp: , Rfl: ;  FLUoxetine (PROZAC) 20 MG capsule, Take 1 capsule (20 mg total) by mouth daily., Disp: 30 capsule, Rfl: 2 haloperidol (HALDOL) 2 MG tablet, Take 2 tablets (4 mg total) by mouth at bedtime., Disp: 30 tablet, Rfl: 0;  levothyroxine (SYNTHROID, LEVOTHROID) 150 MCG tablet, Take 1 tablet (150 mcg total) by mouth daily., Disp: 30 tablet, Rfl: 2;  DISCONTD: benztropine (COGENTIN) 1 MG tablet, Take 0.5 mg by mouth daily., Disp: , Rfl:   Axis Diagnosis:   Axis I: Anxiety Disorder NOS, Post Traumatic Stress Disorder and Schizoaffective Disorder Axis II: Deferred Axis III:  Past Medical History  Diagnosis Date  . Asthma   . Seasonal allergies   . Depression   . PTSD (post-traumatic stress disorder)   . Thyroid disease 2009    Graves disease (pt reported resolved); hypothyriodism  . Abnormal pap  pt reports abnl pap many years ago.  Nl since then.   Axis IV: economic problems, housing problems, occupational problems, other psychosocial or environmental problems, problems related to social environment and problems with primary support group Axis V: 61-70 mild symptoms   Level of Care:  OP  Discharge destination:  Home  Is patient on multiple antipsychotic therapies at discharge:  No    Has Patient had three or more failed trials  of antipsychotic monotherapy by history:  No  Patient phone:  [phone] (home)  Patient address:   [address] [place] [address],   Follow-up recommendations:  Activity:   as tolerated Diet:  Regular Other:  Followup with Dr. are seen for medications and Leeann for therapy pending Peck appointment at [facility].  The patient received suicide prevention pamphlet:  Yes Belongings returned:  Valuables  Margit Banda 01/07/2012, 4:33 PM    [facility] 01/07/2012

## 2012-01-07 NOTE — Progress Notes (Signed)
Patient ID: Colleen Peck, female   DOB: 06-30-1981, 31 y.o.   MRN: [id number] D:  Vernona Rieger from [facility]:  [phone]) phoned and left vm that patient would need to go through their walk-in clinic first (Monday thru Friday 8am-3pm) in order to transition to their services.  A:  Will call and inform patient.  Will also inform [name], LPC to reiterate this information whenever she sees her (pt) on 01-14-12.

## 2012-01-10 ENCOUNTER — Other Ambulatory Visit (HOSPITAL_COMMUNITY): Payer: Medicaid Other

## 2012-01-11 ENCOUNTER — Other Ambulatory Visit (HOSPITAL_COMMUNITY): Payer: Medicaid Other

## 2012-01-12 ENCOUNTER — Other Ambulatory Visit (HOSPITAL_COMMUNITY): Payer: Medicaid Other

## 2012-01-14 ENCOUNTER — Ambulatory Visit (HOSPITAL_COMMUNITY): Payer: Self-pay | Admitting: Psychology

## 2012-01-14 ENCOUNTER — Other Ambulatory Visit (HOSPITAL_COMMUNITY): Payer: Medicaid Other

## 2012-01-17 ENCOUNTER — Other Ambulatory Visit (HOSPITAL_COMMUNITY): Payer: Medicaid Other

## 2012-01-19 ENCOUNTER — Ambulatory Visit (INDEPENDENT_AMBULATORY_CARE_PROVIDER_SITE_OTHER): Payer: Medicaid Other | Admitting: Psychology

## 2012-01-19 DIAGNOSIS — F332 Major depressive disorder, recurrent severe without psychotic features: Secondary | ICD-10-CM

## 2012-01-20 ENCOUNTER — Other Ambulatory Visit (HOSPITAL_COMMUNITY): Payer: Self-pay | Admitting: Psychiatry

## 2012-01-20 NOTE — Progress Notes (Signed)
   THERAPIST PROGRESS NOTE  Session Time: 11.35am-12:30pm  Participation Level: Active  Behavioral Response: Well GroomedAlertDepressed  Type of Therapy: Individual Therapy  Treatment Goals addressed: Diagnosis: MDD, severe w/out psychotic features and goal 1.  Interventions: CBT, Strength-based and Reframing  Summary: Colleen Peck is a 31 y.o. female who presents with depressed mood and affect and tearful in session.  Pt reports she didn't show last week for appointment as she forgot. Pt reported she hadn't followed up w/ referral from IOP to [facility] as she reports not message from IOP about how to proceed.  Pt was hesitant about referral information reviewed and states "doesn't want to be pushed around in system" and scared about having a "label" .  Pt was had difficulty making reframes and recognizing the positives she has done in past week- including voc rehab, beginning application for "orange card insurance", seeking out info about dental hygienist program, attending bible study and church events.  Pt reports still hopeless for improvement as doesn't "feel better", feels fatigued daily and lack of enjoyment in life.  Pt reported had thought of taking several prozac couple days ago as "tired of feeling this way", pt denied any SI since and no intent for self harm and was able to use re framing that day to assist coping.  Pt reported IOP "wasn't helpful" felt worse of then group members.  Pt did report heartmath and meditation as beneficial.  Pt agreed to contact or go to walk in services at [facility] to beginning looking into more intensive support services and change in service providers as opiton and to contact therapist by 01/21/12 re: her progress.   Suicidal/Homicidal: Nowithout intent/plan  Therapist Response: ASsessed pt current functioning per pt report.  Validated pt feelings and assisted pt w/ identifying positives, acknowledging depression as illness and tx as a process to  recovery.  Assisted pt in recognizes positives taking.  Discussed tx options and referral to [facility] as pt no insurance and need for more intensive services.   Plan: Pt to f/u w/ [facility]erral in next 2 days and inform counselor when completed.  Diagnosis: Axis I: Major Depression, Recurrent severe    Axis II: No diagnosis    YATES,LEANNE, LPC 01/20/2012

## 2012-01-21 ENCOUNTER — Ambulatory Visit (INDEPENDENT_AMBULATORY_CARE_PROVIDER_SITE_OTHER): Payer: Medicaid Other | Admitting: Psychiatry

## 2012-01-21 ENCOUNTER — Telehealth: Payer: Self-pay | Admitting: Sports Medicine

## 2012-01-21 ENCOUNTER — Encounter (HOSPITAL_COMMUNITY): Payer: Self-pay | Admitting: Psychiatry

## 2012-01-21 ENCOUNTER — Telehealth (HOSPITAL_COMMUNITY): Payer: Self-pay

## 2012-01-21 VITALS — BP 109/70 | HR 76 | Wt 211.6 lb

## 2012-01-21 DIAGNOSIS — F329 Major depressive disorder, single episode, unspecified: Secondary | ICD-10-CM

## 2012-01-21 DIAGNOSIS — F323 Major depressive disorder, single episode, severe with psychotic features: Secondary | ICD-10-CM

## 2012-01-21 DIAGNOSIS — K089 Disorder of teeth and supporting structures, unspecified: Secondary | ICD-10-CM

## 2012-01-21 DIAGNOSIS — F4310 Post-traumatic stress disorder, unspecified: Secondary | ICD-10-CM

## 2012-01-21 MED ORDER — BUPROPION HCL ER (XL) 300 MG PO TB24: 300.0000 mg | ORAL_TABLET | Freq: Every day | ORAL | Status: DC

## 2012-01-21 MED ORDER — HALOPERIDOL 2 MG PO TABS: 4.0000 mg | ORAL_TABLET | Freq: Every day | ORAL | Status: DC

## 2012-01-21 NOTE — Telephone Encounter (Signed)
01/21/12 no f/u appt being made per Dr. Lolly Mustache the pt is being referred to [facility]./sh

## 2012-01-21 NOTE — Telephone Encounter (Signed)
Patient came by and brought her [facility] card to be scanned.  She would like a referral to the dental clinic for a broken tooth and some fillings.
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
## 2012-01-26 NOTE — Telephone Encounter (Signed)
Ordered; but unlikely to be able to arrange as late in month

## 2012-02-17 ENCOUNTER — Telehealth: Payer: Self-pay | Admitting: Sports Medicine

## 2012-02-17 NOTE — Telephone Encounter (Signed)
Patient is calling to find out what the status of her dentist appt is.

## 2012-02-17 NOTE — Telephone Encounter (Signed)
Called and informed pt that the dental referral has been sent and if and when they accept her as a pt is unknown. Told her that at present we are awaiting to hear back from their office. Pt voiced understanding and agreed.Colleen Peck

## 2012-02-28 ENCOUNTER — Telehealth: Payer: Self-pay | Admitting: Sports Medicine

## 2012-02-28 NOTE — Telephone Encounter (Signed)
Staff Medical Report placed in Dr. Elvis Coil box for completion.  Dr. Swaziland seen patient last on 11/29/2011.  Ileana Ladd

## 2012-02-28 NOTE — Telephone Encounter (Signed)
Patient left form to be completed by provider and called when ready for pickup.

## 2012-02-29 NOTE — Telephone Encounter (Signed)
Will need some more information from pt before I can fill out form.  It looks like she has changed mental health provider, and I want to be sure she is following up with a mental health provider. Left message for pt to call me.

## 2012-03-03 NOTE — Telephone Encounter (Signed)
Patient is calling to let Dr. Swaziland know that she is going to [facility] and she see Dr. Aundra Millet, she doesn't know the last name.

## 2012-03-06 NOTE — Telephone Encounter (Signed)
Spoke with pt briefly- she is at work and cannot talk long now.  She has a job at [employer] and states she loves it.  I want to talk with her further before I fill out her work form.  We agreed to discuss on Thursday.

## 2012-03-13 ENCOUNTER — Encounter (HOSPITAL_COMMUNITY): Payer: Self-pay

## 2012-03-13 ENCOUNTER — Emergency Department (INDEPENDENT_AMBULATORY_CARE_PROVIDER_SITE_OTHER)
Admission: EM | Admit: 2012-03-13 | Discharge: 2012-03-13 | Disposition: A | Discharge status: Home / Self Care | Payer: Self-pay | Source: Home / Self Care

## 2012-03-13 DIAGNOSIS — J40 Bronchitis, not specified as acute or chronic: Secondary | ICD-10-CM

## 2012-03-13 MED ORDER — AZITHROMYCIN 250 MG PO TABS: ORAL_TABLET | ORAL | Status: AC

## 2012-03-13 MED ORDER — ALBUTEROL SULFATE (2.5 MG/3ML) 0.083% IN NEBU: 2.5000 mg | INHALATION_SOLUTION | Freq: Four times a day (QID) | RESPIRATORY_TRACT | Status: DC | PRN

## 2012-03-13 NOTE — ED Notes (Signed)
States she has been having productive cough w green secretions since yesterday; NAD; using MDI, nebulizer

## 2012-03-13 NOTE — ED Provider Notes (Signed)
History     CSN: [id number]  Arrival date & time 03/13/12  1604   First MD Initiated Contact with Patient 03/13/12 1634      Chief Complaint  Patient presents with  . Bronchitis    (Consider location/radiation/quality/duration/timing/severity/associated sxs/prior treatment) HPI 31 year old African American female with history of asthma and seasonal allergies who presents with cough and green productive sputum.  She reports that her symptoms have been going on for approximately 3 weeks and over a week ago she started having green productive sputum.  She does complain of sinus congestion and postnasal drip which has been ongoing for the last 3 weeks.  She also complains of significant cough at night with pain in chest with the cough.  She denies any fevers but does complain of chills.  Denies any shortness of breath.  Denies any abdominal pain, diarrhea, headaches or vision changes.  Past Medical History  Diagnosis Date  . Asthma   . Seasonal allergies   . Depression   . PTSD (post-traumatic stress disorder)   . Thyroid disease 2009    Graves disease (pt reported resolved); hypothyriodism  . Abnormal pap     pt reports abnl pap many years ago.  Nl since then.    Past Surgical History  Procedure Date  . Dilation and curettage of uterus March 2006    Family History  Problem Relation Age of Onset  . Drug abuse Father   . Depression Maternal Aunt   . Depression Maternal Grandmother   . Anxiety disorder Maternal Grandmother   . COPD Maternal Grandmother   . Suicidality Cousin   . Depression Cousin   . Depression Maternal Aunt   . Hypertension Mother   . Diabetes Paternal Grandfather   . COPD Paternal Grandmother   . Heart disease Neg Hx     History  Substance Use Topics  . Smoking status: Never Smoker   . Smokeless tobacco: Never Used  . Alcohol Use: No     past use of alcohol in '08-'09    OB History    Grav Para Term Preterm Abortions TAB SAB Ect Mult Living                Review of Systems  Constitutional: Positive for chills and fatigue. Negative for fever.  HENT: Positive for sore throat and postnasal drip.   Eyes: Negative.   Respiratory: Positive for cough and chest tightness.        Chest tightness from the cough.  Cardiovascular: Positive for chest pain.       Chest pain from the cough.  Gastrointestinal: Negative.   Musculoskeletal: Negative.   Skin: Negative.   Neurological: Negative.   Hematological: Negative.   Psychiatric/Behavioral: Negative.     Allergies  Penicillins  Home Medications   Current Outpatient Rx  Name Route Sig Dispense Refill  . ALBUTEROL SULFATE HFA 108 (90 BASE) MCG/ACT IN AERS Inhalation Inhale 2 puffs into the lungs every 6 (six) hours as needed. For shortness of breath, wheezing.    Marland Kitchen CETIRIZINE HCL 10 MG PO TABS Oral Take 10 mg by mouth daily.      Marland Kitchen FLUOXETINE HCL 20 MG PO CAPS Oral Take 1 capsule (20 mg total) by mouth daily. 30 capsule 2  . LEVOTHYROXINE SODIUM 150 MCG PO TABS Oral Take 1 tablet (150 mcg total) by mouth daily. 30 tablet 2  . ALBUTEROL SULFATE (2.5 MG/3ML) 0.083% IN NEBU Nebulization Take 3 mLs (2.5 mg total) by nebulization every  6 (six) hours as needed for shortness of breath. For shortness of breath 75 mL 0  . AZITHROMYCIN 250 MG PO TABS  Z-Pak dose.  Take 500 milligrams the first day then 250 mg daily for a total of 5 days. 6 each 0  . BUPROPION HCL ER (XL) 300 MG PO TB24 Oral Take 1 tablet (300 mg total) by mouth daily. 30 tablet 0  . CLONAZEPAM 0.5 MG PO TABS Oral Take 0.5 mg by mouth daily. From psych    . HALOPERIDOL 2 MG PO TABS Oral Take 2 tablets (4 mg total) by mouth at bedtime. 60 tablet 0    BP 116/73  Pulse 82  Temp 99.1 F (37.3 C) (Oral)  Resp 16  SpO2 98%  Physical Exam  Vitals reviewed. Constitutional: She is oriented to person, place, and time. She appears well-developed and well-nourished.  HENT:  Head: Normocephalic and atraumatic.       Minimal  erythema in the posterior oropharynx.  Erythema noted in nasopharynx bilaterally.  Eyes: Conjunctivae are normal. Pupils are equal, round, and reactive to light.  Neck: Normal range of motion. Neck supple.       No palpable lymph nodes.  Cardiovascular: Normal rate and regular rhythm.   Pulmonary/Chest: Effort normal.  Abdominal: Soft. Bowel sounds are normal.  Musculoskeletal: Normal range of motion.  Neurological: She is alert and oriented to person, place, and time.  Skin: Skin is warm and dry.    ED Course  Procedures (including critical care time)  Labs Reviewed - No data to display No results found.  Bronchitis   MDM  Suspect patient may have had a viral upper respiratory infection which turned into bronchitis as she has been symptoms for greater than 3 weeks.  Start the patient on azithromycin course for 5 days.  Continue albuterol.  Continue cetirizine for allergies.  If the symptoms don't improve patient was instructed to come back to urgent care or to her primary care physician for further evaluation.        Cristal Ford, MD 03/13/12 [phone]

## 2012-03-14 NOTE — Telephone Encounter (Signed)
Patient is calling back because Dr. [place] did not call her last Thursday.  She can call anytime today or Thursday after 3.

## 2012-03-16 ENCOUNTER — Other Ambulatory Visit: Payer: Self-pay | Admitting: Family Medicine

## 2012-03-16 MED ORDER — ALBUTEROL SULFATE HFA 108 (90 BASE) MCG/ACT IN AERS: 2.0000 | INHALATION_SPRAY | Freq: Four times a day (QID) | RESPIRATORY_TRACT | Status: DC | PRN

## 2012-03-16 NOTE — Telephone Encounter (Signed)
Left message for Colleen Peck that form is ready to be picked up at front desk.  Ileana Ladd

## 2012-03-16 NOTE — Telephone Encounter (Signed)
Spoke with pt.  She is doing well with her depression.  Currently stable on 20 mg of fluoxetine daily.  No other psych meds.  Updated med list.  Follow with psych q 3 months.  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 Form completed and returned to [employer].

## 2012-04-11 ENCOUNTER — Encounter: Payer: Self-pay | Admitting: Sports Medicine

## 2012-04-11 ENCOUNTER — Ambulatory Visit (INDEPENDENT_AMBULATORY_CARE_PROVIDER_SITE_OTHER): Payer: Self-pay | Admitting: Sports Medicine

## 2012-04-11 VITALS — BP 85/63 | HR 70 | Temp 99.0°F | Ht 65.0 in | Wt 225.0 lb

## 2012-04-11 DIAGNOSIS — J309 Allergic rhinitis, unspecified: Secondary | ICD-10-CM

## 2012-04-11 DIAGNOSIS — E669 Obesity, unspecified: Secondary | ICD-10-CM

## 2012-04-11 MED ORDER — FLUTICASONE PROPIONATE 50 MCG/ACT NA SUSP: 2.0000 | Freq: Every day | NASAL | Status: DC

## 2012-04-20 NOTE — Assessment & Plan Note (Addendum)
Discussed options for weight loss including dietary.  Request the patient make a followup appointment for further discussion at her convenience.  Patient once again want to talk about medications to help her with weight loss, however I do not feel as though she is a good candidate at this time as I have serious doubts about her long-term success if she were to rely on medications.

## 2012-04-20 NOTE — Assessment & Plan Note (Addendum)
Rx for Flonase, continue Zyrtec when necessary  followup as necessary

## 2012-04-20 NOTE — Progress Notes (Signed)
  [facility]  Patient name: Colleen Peck MRN [id number]  Date of birth: 03-26-1981  CC & HPI:  Colleen Peck is a 31 y.o. female presenting today for followup of allergic rhinitis.  She has been previously on Zyrtec and this has not been helping her much.  She is having a cough at each night.  This is quite bothersome to her.  Chest is nasal congestion, rhinorrhea, an occasional pain with swallowing with her symptoms are severe.  Just like to discuss weight loss and reports wanted focus on nutrition mainly.  Has not been following a diet regimen but has been trying to "eat healthier"  ROS:  No fevers no chills, no no productive cough, no difficulty breathing, no wheezing  Pertinent History Reviewed:  Medical & Surgical Hx:  Reviewed: Significant for history of asthma, history of hypothyroidism status post ablation, obesity, PTSD Medications: Reviewed & Updated - see associated section Social History: Reviewed - Significant for nonsmoker  Objective Findings:  Vitals:  Filed Vitals:   04/11/12 1554  BP: 85/63  Pulse: 70  Temp: 99 F (37.2 C)    PE: GENERAL:  Adult obese female. In no discomfort; no respiratory distress. PSYCH: Alert and appropriately interactive; Insight:Fair   H&N: AT/[place], trachea midline, EENT:  MMM, no scleral icterus, EOMi bilateral tympanic membranes with effusions however no erythema, no air-fluid level, nose: Nasal mucosa with significant pallor and bilateral nasal polyps.  Posterior oropharynx erythematous, without exudate HEART: RRR, S1/S2 heard, no murmur LUNGS: CTA B, no wheezes, no crackles EXTREMITIES: Moves all 4 extremities spontaneously, warm well perfused, no edema, bilateral DP and PT pulses 2/4.      Assessment & Plan:

## 2012-04-27 ENCOUNTER — Telehealth: Payer: Self-pay | Admitting: Sports Medicine

## 2012-04-27 DIAGNOSIS — Z299 Encounter for prophylactic measures, unspecified: Secondary | ICD-10-CM

## 2012-04-27 DIAGNOSIS — E669 Obesity, unspecified: Secondary | ICD-10-CM

## 2012-04-27 DIAGNOSIS — E039 Hypothyroidism, unspecified: Secondary | ICD-10-CM

## 2012-04-27 NOTE — Telephone Encounter (Signed)
Patient is calling to cancel her appt with Dr. Berline Chough on 10/29 am, she thought she only needed labs drawn, which I didn't see in the last Office note.  She needed to come in after 3:00 so she is scheduled for labs that date, but orders need to be put in for that.  Unless, Dr. Berline Chough does feel that she needs to see him before having those labs done, and if so, she will need to be called about scheduling that appt.

## 2012-04-27 NOTE — Telephone Encounter (Signed)
Forwarded to pcp.Drezden Seitzinger Lynetta  

## 2012-04-27 NOTE — Telephone Encounter (Signed)
Pt needs to apply for orange card and was given Barbara's information.  Once she has this we will be able to order labs.  Until this happens there is a chance she will be stuck with the bill.    We discussed waiting to order labs until seen in 1 month and orange card obtained.  She needs fasting labs so likely a 300 lab appointment will not work unless she can be fasted for >8hrs.    Please call and reschedule for AM lab draw if she has obtained the orange card.  Orders placed for labs needed  I do want an appointment with her ~1 week following lab draw to discuss her results

## 2012-05-01 ENCOUNTER — Other Ambulatory Visit: Payer: Self-pay

## 2012-05-02 ENCOUNTER — Other Ambulatory Visit: Payer: Self-pay

## 2012-05-03 ENCOUNTER — Other Ambulatory Visit: Payer: Self-pay

## 2012-05-03 DIAGNOSIS — E669 Obesity, unspecified: Secondary | ICD-10-CM

## 2012-05-03 DIAGNOSIS — E039 Hypothyroidism, unspecified: Secondary | ICD-10-CM

## 2012-05-03 LAB — BASIC METABOLIC PANEL
BUN: 10 mg/dL (ref 6–23)
Chloride: 104 mEq/L (ref 96–112)
Creat: 0.79 mg/dL (ref 0.50–1.10)

## 2012-05-03 LAB — LIPID PANEL
Cholesterol: 175 mg/dL (ref 0–200)
LDL Cholesterol: 118 mg/dL — ABNORMAL HIGH (ref 0–99)
Triglycerides: 76 mg/dL (ref <150)

## 2012-05-03 LAB — CBC
HCT: 33.2 % — ABNORMAL LOW (ref 36.0–46.0)
MCH: 28 pg (ref 26.0–34.0)
MCHC: 33.4 g/dL (ref 30.0–36.0)
MCV: 83.8 fL (ref 78.0–100.0)
RDW: 13.5 % (ref 11.5–15.5)

## 2012-05-03 NOTE — Progress Notes (Signed)
FLP,TSH,CBC AND BMP DONE TODAY Colleen Peck

## 2012-05-09 ENCOUNTER — Other Ambulatory Visit: Payer: Self-pay

## 2012-05-09 ENCOUNTER — Ambulatory Visit: Payer: Self-pay | Admitting: Sports Medicine

## 2012-05-15 ENCOUNTER — Ambulatory Visit (INDEPENDENT_AMBULATORY_CARE_PROVIDER_SITE_OTHER): Payer: Self-pay | Admitting: Sports Medicine

## 2012-05-15 ENCOUNTER — Encounter (HOSPITAL_COMMUNITY): Payer: Self-pay | Admitting: Psychology

## 2012-05-15 ENCOUNTER — Encounter: Payer: Self-pay | Admitting: Sports Medicine

## 2012-05-15 VITALS — BP 129/76 | HR 89 | Temp 98.1°F | Ht 67.0 in | Wt 230.4 lb

## 2012-05-15 DIAGNOSIS — E669 Obesity, unspecified: Secondary | ICD-10-CM

## 2012-05-15 NOTE — Progress Notes (Signed)
Outpatient Therapist Discharge Summary  Colleen Peck    [birth year]   Admission Date: 06/17/11   Discharge Date:  01/21/12 Reason for Discharge:  Referred for services at [facility] to better meet pt needs Diagnosis:  MDD, recurrent severe   [name]

## 2012-05-15 NOTE — Patient Instructions (Addendum)
It was good to see you.  Check out a couple of movies:  TED Talk - Weekday Veg  Movies: "Supersize Me"; "Fat Sick and Nearly Dead"   *[employer] over [employer]*  Come back to see me in 4-6 months

## 2012-05-31 NOTE — Assessment & Plan Note (Signed)
Greater than 50% of 30 min visit spent in direct pt counseling with pt regarding nutrition Will focus on eating more whole foods and decreasing sugary, salty and fatty foods F/u in 3-4 months or prn

## 2012-05-31 NOTE — Progress Notes (Signed)
  [facility]  Patient name: Colleen Peck MRN [id number]  Date of birth: April 13, 1981  CC & HPI:  Colleen Peck is a 31 y.o. female presenting today for follow up of labs and to discuss weight loss:  #obesity:  Pt has struggled with weight since early teenage years.  Feels diet is not varied,  High content of sweets, salty, and suggary foods.  Highly processed.  Not many fresh vegetables/fruits.  Few whole grains.  Does not eat consistently throughout day  ROS:  No fevers, chills, or other acute issues  Pertinent History Reviewed:  Medical & Surgical Hx:  Reviewed: Significant for asthma, depression/PTSD, thypothyroidism Medications: Reviewed & Updated - see associated section Social History: Reviewed -  reports that she has never smoked. She has never used smokeless tobacco.   Objective Findings:  Vitals:   Filed Vitals:   05/15/12 1648  BP: 129/76  Pulse: 89  Temp: 98.1 F (36.7 C)  TempSrc: Oral  Height: 5\' 7"  (1.702 m)  Weight: 230 lb 6.4 oz (104.509 kg)    PE: GENERAL:  Adult obese AA female. In no discomfort; no respiratory distress. PSYCH: alert and appropriate    Assessment & Plan:

## 2012-07-13 ENCOUNTER — Inpatient Hospital Stay (HOSPITAL_COMMUNITY)
Admission: EM | Admit: 2012-07-13 | Discharge: 2012-07-14 | DRG: 153 | Disposition: A | Discharge status: Home / Self Care | Payer: No Typology Code available for payment source | Attending: Family Medicine | Admitting: Family Medicine

## 2012-07-13 ENCOUNTER — Encounter: Payer: Self-pay | Admitting: Family Medicine

## 2012-07-13 ENCOUNTER — Encounter (HOSPITAL_COMMUNITY): Payer: Self-pay | Admitting: Cardiology

## 2012-07-13 ENCOUNTER — Ambulatory Visit (INDEPENDENT_AMBULATORY_CARE_PROVIDER_SITE_OTHER): Payer: No Typology Code available for payment source | Admitting: Family Medicine

## 2012-07-13 ENCOUNTER — Emergency Department (HOSPITAL_COMMUNITY): Payer: No Typology Code available for payment source

## 2012-07-13 VITALS — BP 111/72 | HR 128 | Temp 103.2°F | Ht 67.0 in

## 2012-07-13 DIAGNOSIS — Z23 Encounter for immunization: Secondary | ICD-10-CM

## 2012-07-13 DIAGNOSIS — R509 Fever, unspecified: Secondary | ICD-10-CM

## 2012-07-13 DIAGNOSIS — J111 Influenza due to unidentified influenza virus with other respiratory manifestations: Principal | ICD-10-CM | POA: Diagnosis present

## 2012-07-13 DIAGNOSIS — R0600 Dyspnea, unspecified: Secondary | ICD-10-CM

## 2012-07-13 DIAGNOSIS — R0602 Shortness of breath: Secondary | ICD-10-CM | POA: Insufficient documentation

## 2012-07-13 DIAGNOSIS — E669 Obesity, unspecified: Secondary | ICD-10-CM | POA: Diagnosis present

## 2012-07-13 DIAGNOSIS — F332 Major depressive disorder, recurrent severe without psychotic features: Secondary | ICD-10-CM | POA: Diagnosis present

## 2012-07-13 DIAGNOSIS — R0989 Other specified symptoms and signs involving the circulatory and respiratory systems: Secondary | ICD-10-CM

## 2012-07-13 DIAGNOSIS — E039 Hypothyroidism, unspecified: Secondary | ICD-10-CM | POA: Diagnosis present

## 2012-07-13 DIAGNOSIS — E860 Dehydration: Secondary | ICD-10-CM

## 2012-07-13 DIAGNOSIS — Z6836 Body mass index (BMI) 36.0-36.9, adult: Secondary | ICD-10-CM

## 2012-07-13 DIAGNOSIS — R0902 Hypoxemia: Secondary | ICD-10-CM

## 2012-07-13 DIAGNOSIS — F4310 Post-traumatic stress disorder, unspecified: Secondary | ICD-10-CM | POA: Diagnosis present

## 2012-07-13 DIAGNOSIS — L508 Other urticaria: Secondary | ICD-10-CM | POA: Diagnosis present

## 2012-07-13 DIAGNOSIS — J45909 Unspecified asthma, uncomplicated: Secondary | ICD-10-CM | POA: Diagnosis present

## 2012-07-13 DIAGNOSIS — Z880 Allergy status to penicillin: Secondary | ICD-10-CM

## 2012-07-13 DIAGNOSIS — F411 Generalized anxiety disorder: Secondary | ICD-10-CM | POA: Diagnosis present

## 2012-07-13 DIAGNOSIS — R062 Wheezing: Secondary | ICD-10-CM

## 2012-07-13 DIAGNOSIS — J208 Acute bronchitis due to other specified organisms: Secondary | ICD-10-CM | POA: Diagnosis present

## 2012-07-13 DIAGNOSIS — J101 Influenza due to other identified influenza virus with other respiratory manifestations: Secondary | ICD-10-CM | POA: Diagnosis present

## 2012-07-13 DIAGNOSIS — J45901 Unspecified asthma with (acute) exacerbation: Secondary | ICD-10-CM | POA: Diagnosis present

## 2012-07-13 LAB — CBC WITH DIFFERENTIAL/PLATELET
Basophils Relative: 0 % (ref 0–1)
Eosinophils Absolute: 0 10*3/uL (ref 0.0–0.7)
MCH: 26.9 pg (ref 26.0–34.0)
MCHC: 31.9 g/dL (ref 30.0–36.0)
Neutro Abs: 7.9 10*3/uL — ABNORMAL HIGH (ref 1.7–7.7)
Neutrophils Relative %: 93 % — ABNORMAL HIGH (ref 43–77)
Platelets: 216 10*3/uL (ref 150–400)
RBC: 3.64 MIL/uL — ABNORMAL LOW (ref 3.87–5.11)

## 2012-07-13 LAB — URINALYSIS, ROUTINE W REFLEX MICROSCOPIC
Bilirubin Urine: NEGATIVE
Leukocytes, UA: NEGATIVE
Nitrite: NEGATIVE
Specific Gravity, Urine: 1.014 (ref 1.005–1.030)
Urobilinogen, UA: 0.2 mg/dL (ref 0.0–1.0)
pH: 5.5 (ref 5.0–8.0)

## 2012-07-13 LAB — BASIC METABOLIC PANEL
Chloride: 99 mEq/L (ref 96–112)
GFR calc Af Amer: >90 mL/min (ref >90)
GFR calc non Af Amer: >90 mL/min (ref >90)
Potassium: 3.8 mEq/L (ref 3.5–5.1)
Sodium: 135 mEq/L (ref 135–145)

## 2012-07-13 MED ORDER — LORATADINE 10 MG PO TABS
10.0000 mg | ORAL_TABLET | Freq: Every day | ORAL | Status: DC
  Administered 2012-07-14: 10 mg via ORAL
  Filled 2012-07-13: qty 1

## 2012-07-13 MED ORDER — OSELTAMIVIR PHOSPHATE 75 MG PO CAPS
75.0000 mg | ORAL_CAPSULE | Freq: Two times a day (BID) | ORAL | Status: DC
  Administered 2012-07-13 – 2012-07-14 (×2): 75 mg via ORAL
  Filled 2012-07-13 (×3): qty 1

## 2012-07-13 MED ORDER — ALBUTEROL SULFATE (5 MG/ML) 0.5% IN NEBU
2.5000 mg | INHALATION_SOLUTION | Freq: Four times a day (QID) | RESPIRATORY_TRACT | Status: DC
  Administered 2012-07-14 (×3): 2.5 mg via RESPIRATORY_TRACT
  Filled 2012-07-13 (×3): qty 0.5

## 2012-07-13 MED ORDER — IPRATROPIUM BROMIDE 0.02 % IN SOLN
0.5000 mg | Freq: Once | RESPIRATORY_TRACT | Status: AC
  Administered 2012-07-13: 0.5 mg via RESPIRATORY_TRACT

## 2012-07-13 MED ORDER — ACETAMINOPHEN 500 MG PO TABS
1000.0000 mg | ORAL_TABLET | Freq: Once | ORAL | Status: AC
  Administered 2012-07-13: 1000 mg via ORAL
  Filled 2012-07-13: qty 2

## 2012-07-13 MED ORDER — SODIUM CHLORIDE 0.9 % IV SOLN
INTRAVENOUS | Status: DC
  Administered 2012-07-14: 06:00:00 via INTRAVENOUS

## 2012-07-13 MED ORDER — LEVOTHYROXINE SODIUM 150 MCG PO TABS
150.0000 ug | ORAL_TABLET | Freq: Every day | ORAL | Status: DC
  Administered 2012-07-14: 150 ug via ORAL
  Filled 2012-07-13 (×2): qty 1

## 2012-07-13 MED ORDER — IBUPROFEN 800 MG PO TABS
800.0000 mg | ORAL_TABLET | Freq: Once | ORAL | Status: AC
  Administered 2012-07-13: 800 mg via ORAL
  Filled 2012-07-13: qty 1

## 2012-07-13 MED ORDER — ONDANSETRON HCL 4 MG/2ML IJ SOLN: 4.0000 mg | Freq: Four times a day (QID) | INTRAMUSCULAR | Status: DC | PRN

## 2012-07-13 MED ORDER — ACETAMINOPHEN 325 MG PO TABS
650.0000 mg | ORAL_TABLET | Freq: Four times a day (QID) | ORAL | Status: DC | PRN
  Administered 2012-07-13: 650 mg via ORAL
  Filled 2012-07-13: qty 2

## 2012-07-13 MED ORDER — FLUTICASONE PROPIONATE 50 MCG/ACT NA SUSP
2.0000 | Freq: Every day | NASAL | Status: DC
  Administered 2012-07-14: 2 via NASAL
  Filled 2012-07-13: qty 16

## 2012-07-13 MED ORDER — IBUPROFEN 200 MG PO TABS
800.0000 mg | ORAL_TABLET | Freq: Once | ORAL | Status: AC
  Administered 2012-07-13: 800 mg via ORAL

## 2012-07-13 MED ORDER — IPRATROPIUM BROMIDE 0.02 % IN SOLN
0.5000 mg | Freq: Four times a day (QID) | RESPIRATORY_TRACT | Status: DC
  Administered 2012-07-14 (×3): 0.5 mg via RESPIRATORY_TRACT
  Filled 2012-07-13 (×3): qty 2.5

## 2012-07-13 MED ORDER — SENNA 8.6 MG PO TABS
1.0000 | ORAL_TABLET | Freq: Two times a day (BID) | ORAL | Status: DC
  Administered 2012-07-13 – 2012-07-14 (×2): 8.6 mg via ORAL
  Filled 2012-07-13 (×3): qty 1

## 2012-07-13 MED ORDER — ONDANSETRON HCL 4 MG/2ML IJ SOLN
4.0000 mg | Freq: Once | INTRAMUSCULAR | Status: AC
  Administered 2012-07-13: 4 mg via INTRAVENOUS
  Filled 2012-07-13: qty 2

## 2012-07-13 MED ORDER — SODIUM CHLORIDE 0.9 % IV BOLUS (SEPSIS)
1000.0000 mL | Freq: Once | INTRAVENOUS | Status: AC
  Administered 2012-07-13: 1000 mL via INTRAVENOUS

## 2012-07-13 MED ORDER — ALBUTEROL SULFATE (5 MG/ML) 0.5% IN NEBU: 2.5000 mg | INHALATION_SOLUTION | RESPIRATORY_TRACT | Status: DC | PRN

## 2012-07-13 MED ORDER — DIPHENHYDRAMINE HCL 25 MG PO CAPS: 25.0000 mg | ORAL_CAPSULE | Freq: Every evening | ORAL | Status: DC | PRN

## 2012-07-13 MED ORDER — ONDANSETRON HCL 4 MG PO TABS: 4.0000 mg | ORAL_TABLET | Freq: Four times a day (QID) | ORAL | Status: DC | PRN

## 2012-07-13 MED ORDER — HEPARIN SODIUM (PORCINE) 5000 UNIT/ML IJ SOLN
5000.0000 [IU] | Freq: Three times a day (TID) | INTRAMUSCULAR | Status: DC
  Administered 2012-07-13 – 2012-07-14 (×2): 5000 [IU] via SUBCUTANEOUS
  Filled 2012-07-13 (×5): qty 1

## 2012-07-13 MED ORDER — IOHEXOL 350 MG/ML SOLN: 80.0000 mL | Freq: Once | INTRAVENOUS | Status: AC | PRN

## 2012-07-13 MED ORDER — FLUOXETINE HCL 20 MG PO CAPS
20.0000 mg | ORAL_CAPSULE | Freq: Every day | ORAL | Status: DC
  Administered 2012-07-14: 20 mg via ORAL
  Filled 2012-07-13: qty 1

## 2012-07-13 MED ORDER — SODIUM CHLORIDE 0.9 % IV BOLUS (SEPSIS)
500.0000 mL | Freq: Once | INTRAVENOUS | Status: AC
  Administered 2012-07-13: via INTRAVENOUS

## 2012-07-13 MED ORDER — PREDNISONE 50 MG PO TABS
60.0000 mg | ORAL_TABLET | Freq: Every day | ORAL | Status: DC
  Administered 2012-07-14: 60 mg via ORAL
  Filled 2012-07-13 (×2): qty 1

## 2012-07-13 MED ORDER — PNEUMOCOCCAL VAC POLYVALENT 25 MCG/0.5ML IJ INJ
0.5000 mL | INJECTION | INTRAMUSCULAR | Status: AC
  Administered 2012-07-14: 0.5 mL via INTRAMUSCULAR
  Filled 2012-07-13: qty 0.5

## 2012-07-13 MED ORDER — ACETAMINOPHEN 650 MG RE SUPP: 650.0000 mg | Freq: Four times a day (QID) | RECTAL | Status: DC | PRN

## 2012-07-13 MED ORDER — ALBUTEROL SULFATE (2.5 MG/3ML) 0.083% IN NEBU
2.5000 mg | INHALATION_SOLUTION | Freq: Once | RESPIRATORY_TRACT | Status: AC
  Administered 2012-07-13: 2.5 mg via RESPIRATORY_TRACT

## 2012-07-13 MED ORDER — BIOTENE DRY MOUTH MT LIQD
15.0000 mL | Freq: Two times a day (BID) | OROMUCOSAL | Status: DC
  Administered 2012-07-13 – 2012-07-14 (×2): 15 mL via OROMUCOSAL

## 2012-07-13 MED ORDER — LORAZEPAM 2 MG/ML IJ SOLN
1.0000 mg | Freq: Once | INTRAMUSCULAR | Status: AC
  Administered 2012-07-13: 1 mg via INTRAVENOUS
  Filled 2012-07-13: qty 1

## 2012-07-13 NOTE — ED Notes (Signed)
Pt transported to radiology.

## 2012-07-13 NOTE — ED Notes (Signed)
PT COMPLAINS SHE NEEDS TO GO HAVE A BOWEL MOVEMENT BUT IS CONSTIPATED.

## 2012-07-13 NOTE — ED Notes (Signed)
Patient transported to CT 

## 2012-07-13 NOTE — ED Provider Notes (Signed)
1600 report received from Abby PA for this 32 year old female with flulike symptoms and O2 sat of 90, tachypnea, anemia. Waiting on a d-dimer results to rule out PE. She's received Ativan for her tachypnea which has improved her breathing. Chest x-ray unremarkable. Will just: Labs are back.  [id number] Radiology notified that P. CT of the chest is not back yet requesting report.  1900 CT images shows no PE patient remained short of breath. Sats are 92 on 2 L. She is hypoxic on room air. She is tachypnea and tachycardic. She says flulike symptoms. Will call family practice and get her admitted. I believe she has a viral illness possibly the flu.  Labs Reviewed  CBC WITH DIFFERENTIAL - Abnormal; Notable for the following:    RBC 3.64 (*)     Hemoglobin 9.8 (*)     HCT 30.7 (*)     Neutrophils Relative 93 (*)     Neutro Abs 7.9 (*)     Lymphocytes Relative 4 (*)     Lymphs Abs 0.3 (*)     All other components within normal limits  BASIC METABOLIC PANEL - Abnormal; Notable for the following:    Glucose, Bld 102 (*)     BUN 5 (*)     All other components within normal limits  D-DIMER, QUANTITATIVE - Abnormal; Notable for the following:    D-Dimer, Quant 1.32 (*)     All other components within normal limits  URINALYSIS, ROUTINE W REFLEX MICROSCOPIC  POCT PREGNANCY, URINE   Remi Haggard, NP 07/13/12 2001  Remi Haggard, NP 07/13/12 2002  Remi Haggard, NP 07/18/12 1246

## 2012-07-13 NOTE — ED Provider Notes (Signed)
History     CSN: [id number]  Arrival date & time 07/13/12  1104   First MD Initiated Contact with Patient 07/13/12 1113      Chief Complaint  Patient presents with  . Fever  . Emesis  . Shortness of Breath    (Consider location/radiation/quality/duration/timing/severity/associated sxs/prior treatment) HPI  Colleen Peck is a 32 year old female who presents to emergency department with chief complaint of shortness of breath.  He shouldn't states that her symptoms began yesterday.  She has had a few days of postnasal drip and developing upper respiratory infection.  Patient states that she has been using her albuterol nebulizer at home without relief of shortness of breath. She denies any wheezing.  Her symptoms are worse at night.  Patient was unaware that she was febrile. She has had several episodes of vomiting. She states she has pain that is worse with breathing. Denies  chest tightness or pressure, radiation to left arm, jaw or back, or diaphoresis. Denies dysuria, flank pain, suprapubic pain, frequency, urgency, or hematuria. Denies headaches, light headedness, weakness, visual disturbances. Denies abdominal pain, nausea, vomiting, diarrhea or constipation.    Past Medical History  Diagnosis Date  . Asthma   . Seasonal allergies   . Depression   . PTSD (post-traumatic stress disorder)   . Thyroid disease 2009    Graves disease (pt reported resolved); hypothyriodism  . Abnormal pap     pt reports abnl pap many years ago.  Nl since then.    Past Surgical History  Procedure Date  . Dilation and curettage of uterus March 2006    Family History  Problem Relation Age of Onset  . Drug abuse Father   . Depression Maternal Aunt   . Depression Maternal Grandmother   . Anxiety disorder Maternal Grandmother   . COPD Maternal Grandmother   . Suicidality Cousin   . Depression Cousin   . Depression Maternal Aunt   . Hypertension Mother   . Diabetes Paternal Grandfather     . COPD Paternal Grandmother   . Heart disease Neg Hx     History  Substance Use Topics  . Smoking status: Never Smoker   . Smokeless tobacco: Never Used  . Alcohol Use: No     Comment: past use of alcohol in '08-'09    OB History    Grav Para Term Preterm Abortions TAB SAB Ect Mult Living                  Review of Systems Ten systems reviewed and are negative for acute change, except as noted in the HPI.   Allergies  Penicillins  Home Medications   Current Outpatient Rx  Name  Route  Sig  Dispense  Refill  . ALBUTEROL SULFATE HFA 108 (90 BASE) MCG/ACT IN AERS   Inhalation   Inhale 2 puffs into the lungs every 6 (six) hours as needed. For shortness of breath, wheezing.   1 Inhaler   6   . ALBUTEROL SULFATE (2.5 MG/3ML) 0.083% IN NEBU   Nebulization   Take 3 mLs (2.5 mg total) by nebulization every 6 (six) hours as needed for shortness of breath. For shortness of breath   75 mL   0   . CETIRIZINE HCL 10 MG PO TABS   Oral   Take 10 mg by mouth daily.           Marland Kitchen FLUOXETINE HCL 20 MG PO CAPS   Oral   Take 1 capsule (20  mg total) by mouth daily.   30 capsule   2   . FLUTICASONE PROPIONATE 50 MCG/ACT NA SUSP   Nasal   Place 2 sprays into the nose daily.   16 g   6   . LEVOTHYROXINE SODIUM 150 MCG PO TABS   Oral   Take 1 tablet (150 mcg total) by mouth daily.   30 tablet   2     BP 120/71  Pulse 136  Temp 101.9 F (38.8 C) (Oral)  Resp 19  SpO2 100%  Physical Exam Physical Exam  Nursing note and vitals reviewed. Constitutional: She is oriented to person, place, and time. Mildly distressed. Appears SOB HENT:  Head: Normocephalic and atraumatic.  Eyes: Conjunctivae normal and EOM are normal. Pupils are equal, round, and reactive to light. No scleral icterus.  Neck: Normal range of motion.  Cardiovascular: Normal rate, regular rhythm and normal heart sounds.  Exam reveals no gallop and no friction rub.   No murmur heard. Pulmonary/Chest: She  is tachypneic and  Speaks in 5 -6 word bursts.  There are no whezes or rales..  Abdominal: Soft. Bowel sounds are normal. She exhibits no distension and no mass. There is no tenderness. There is no guarding.  Neurological: She is alert and oriented to person, place, and time.  Skin: Skin is warm and dry. She is not diaphoretic.    ED Course  Procedures (including critical care time)  Labs Reviewed - No data to display No results found.   No diagnosis found.    MDM  11:44 AM BP 120/71  Pulse 136  Temp 101.9 F (38.8 C) (Oral)  Resp 19  SpO2 97% Patient is more relaxed with Ativan. I ordered a CXR and repeat due to poor lung expansion ofn the first.    2:31 PM Repeat cxr shows bonchitis. I have ordered a D Dimer she is moderate wells and not a perc r/o. As she is tachypneic and tachycardic eith cp and SOBi feel that PE should be ruled out.  3:59 PM Awaiting d dimer. Patient remains tachypneic and tachycardic. Pt is seen and examined; Initial history and physical IV fluids, . Labs ordered and pending D-Dimer. Disposition will be pending lab studies and reassessment. I have given report to NP Thurston Hole Who has agreed to assume car of the patient.     Arthor Captain, PA-C 07/13/12 1635

## 2012-07-13 NOTE — Progress Notes (Signed)
Family Medicine Teaching Service  Hospital Admission History and Physical  Patient name: Colleen Peck Medical record number: [id number]  Date of birth: 07/16/1980 Age: 31 y.o. Gender: female   Primary Care Provider: RIGBY, MICHAEL, DO   Chief Complaint: Dyspnea  History of Present Illness: Colleen Peck is a 31 y.o. year old female with obesity, asthma, and anxiety who presented to the ED today with dyspnea, fever, and tachycardia. She was found to be mildly hypoxemic to 90 on room air and have an elevated D-dimer. A CT-Angiogram ruled out pulmonary embolism, but the patient was admitted for hypoxemia requirin 2L of O2 via N/C. She notes that the symptoms started yesterday with nasal drainage, which progressed to shortness of breath, pleuritic chest pain, fever, and a "racing heart." She has had multiple sick contacts: her mother with whom she lives who had pneumonia and her co-worker had bronchitis and sinus infection.   Patient Active Problem List   Diagnosis   .  OBESITY, NOS   .  RHINITIS, ALLERGIC   .  ASTHMA, INTERMITTENT   .  ECZEMA, ATOPIC DERMATITIS   .  PALPITATIONS   .  ANGIOEDEMA   .  Depression with anxiety   .  Major depressive disorder, recurrent episode, severe, without mention of psychotic behavior   .  Posttraumatic stress disorder   .  Hypothyroidism (acquired)   .  Galactorrhea   .  Preventive measure   .  Dyspnea    Past Medical History:  Past Medical History   Diagnosis  Date   .  Asthma    .  Seasonal allergies    .  Depression    .  PTSD (post-traumatic stress disorder)    .  Thyroid disease  2009     Graves disease (pt reported resolved); hypothyriodism   .  Abnormal pap      pt reports abnl pap many years ago. Nl since then.    Past Surgical History:  Past Surgical History   Procedure  Date   .  Dilation and curettage of uterus  March 2006    Social History:  History    Social History   .  Marital Status:  Single     Spouse Name:  N/A    Number of Children:  N/A   .  Years of Education:  N/A    Social History Main Topics   .  Smoking status:  Never Smoker   .  Smokeless tobacco:  Never Used   .  Alcohol Use:  No      Comment: past use of alcohol in '08-'09   .  Drug Use:  No      Comment: past use of marijuana in '08-'09.   .  Sexually Active:  Not Currently     Birth Control/ Protection:  Abstinence    Other Topics  Concern   .  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    Family History:  Family History   Problem  Relation  Age of Onset   .  Drug abuse  Father    .  Depression  Maternal Aunt    .  Depression  Maternal Grandmother    .  Anxiety disorder  Maternal Grandmother    .  COPD  Maternal Grandmother    .  Suicidality  Cousin    .  Depression  Cousin    .    Depression  Maternal Aunt    .  Hypertension  Mother    .  Diabetes  Paternal Grandfather    .  COPD  Paternal Grandmother    .  Heart disease  Neg Hx     Allergies:  Allergies   Allergen  Reactions   .  Penicillins      REACTION: Questionable allergy- rash    Current Facility-Administered Medications   Medication  Dose  Route  Frequency  Provider  Last Rate  Last Dose   .  antiseptic oral rinse (BIOTENE) solution 15 mL  15 mL  Mouth Rinse  BID  Sara L Neal, MD     .  iohexol (OMNIPAQUE) 350 MG/ML injection 80 mL  80 mL  Intravenous  Once PRN  Medication Radiologist, MD     .  pneumococcal 23 valent vaccine (PNU-IMMUNE) injection 0.5 mL  0.5 mL  Intramuscular  Tomorrow-1000  Sara L Neal, MD      Review Of Systems: Per HPI with the following additions: nausea  Otherwise 12 point review of systems was performed and was unremarkable.  Physical Exam:  BP 113/72  Pulse 111  Temp 100.5 F (38.1 C) (Oral)  Resp 20  Ht 5' 7" (1.702 m)  Wt 236 lb 1.8 oz (107.1 kg)  BMI 36.98 kg/m2  SpO2 99%  LMP 07/06/2012  General: alert and cooperative, ill appearing    HEENT: extra ocular movement intact and sclera clear, anicteric; OP dry with cracked lips, shotty submandibular lymphadenopathy bilaterally  Heart: sinus tachycardia  Lungs: mildly increased work of breathing, decrease air movement in bases, scattered rhonchi, no wheezes or rales  Abdomen: abdomen is soft without significant tenderness, masses, organomegaly or guarding  Extremities: extremities normal, atraumatic, no cyanosis or edema  Skin:no rashes, warm, dry  Neurology: normal without focal findings, mental status, speech normal, alert and oriented x3, PERLA and reflexes normal and symmetric  Labs and Imaging:  Results for orders placed during the hospital encounter of 07/13/12 (from the past 24 hour(s))   CBC WITH DIFFERENTIAL Status: Abnormal    Collection Time    07/13/12 12:19 PM   Component  Value  Range    WBC  8.6  4.0 - 10.5 K/uL    RBC  3.64 (*)  3.87 - 5.11 MIL/uL    Hemoglobin  9.8 (*)  12.0 - 15.0 g/dL    HCT  30.7 (*)  36.0 - 46.0 %    MCV  84.3  78.0 - 100.0 fL    MCH  26.9  26.0 - 34.0 pg    MCHC  31.9  30.0 - 36.0 g/dL    RDW  12.7  11.5 - 15.5 %    Platelets  216  150 - 400 K/uL    Neutrophils Relative  93 (*)  43 - 77 %    Neutro Abs  7.9 (*)  1.7 - 7.7 K/uL    Lymphocytes Relative  4 (*)  12 - 46 %    Lymphs Abs  0.3 (*)  0.7 - 4.0 K/uL    Monocytes Relative  3  3 - 12 %    Monocytes Absolute  0.3  0.1 - 1.0 K/uL    Eosinophils Relative  1  0 - 5 %    Eosinophils Absolute  0.0  0.0 - 0.7 K/uL    Basophils Relative  0  0 - 1 %    Basophils Absolute  0.0  0.0 - 0.1   K/uL   BASIC METABOLIC PANEL Status: Abnormal    Collection Time    07/13/12 12:19 PM   Component  Value  Range    Sodium  135  135 - 145 mEq/L    Potassium  3.8  3.5 - 5.1 mEq/L    Chloride  99  96 - 112 mEq/L    CO2  22  19 - 32 mEq/L    Glucose, Bld  102 (*)  70 - 99 mg/dL    BUN  5 (*)  6 - 23 mg/dL    Creatinine, Ser  0.71  0.50 - 1.10 mg/dL    Calcium  9.2  8.4 - 10.5 mg/dL    GFR calc  non Af Amer  >90  >90 mL/min    GFR calc Af Amer  >90  >90 mL/min   URINALYSIS, ROUTINE W REFLEX MICROSCOPIC Status: Normal    Collection Time    07/13/12 2:55 PM   Component  Value  Range    Color, Urine  YELLOW  YELLOW    APPearance  CLEAR  CLEAR    Specific Gravity, Urine  1.014  1.005 - 1.030    pH  5.5  5.0 - 8.0    Glucose, UA  NEGATIVE  NEGATIVE mg/dL    Hgb urine dipstick  NEGATIVE  NEGATIVE    Bilirubin Urine  NEGATIVE  NEGATIVE    Ketones, ur  NEGATIVE  NEGATIVE mg/dL    Protein, ur  NEGATIVE  NEGATIVE mg/dL    Urobilinogen, UA  0.2  0.0 - 1.0 mg/dL    Nitrite  NEGATIVE  NEGATIVE    Leukocytes, UA  NEGATIVE  NEGATIVE   POCT PREGNANCY, URINE Status: Normal    Collection Time    07/13/12 3:03 PM   Component  Value  Range    Preg Test, Ur  NEGATIVE  NEGATIVE   D-DIMER, QUANTITATIVE Status: Abnormal    Collection Time    07/13/12 3:46 PM   Component  Value  Range    D-Dimer, Quant  1.32 (*)  0.00 - 0.48 ug/mL-FEU    Dg Chest 2 View  07/13/2012 *RADIOLOGY REPORT* Clinical Data: Fever, emesis, short of breath CHEST - 2 VIEW Comparison: Prior chest x-ray 04/24/2011; prior chest CT 04/06/2008 Findings: Interval blunting left costophrenic angle on the frontal view. No large pleural effusion seen on the lateral view. Cardiac and mediastinal contours within normal limits. Lungs are mildly hypoinflated. No focal airspace consolidation or pulmonary edema. No acute osseous abnormality. IMPRESSION: Interval development of blunting of the left costophrenic angle may represent subsegmental atelectasis, early infiltrate or trace left pleural effusion. Original Report Authenticated By: Heath McCullough, M.D.  Ct Angio Chest Pe W/cm &/or Wo Cm  07/13/2012 *RADIOLOGY REPORT* Clinical Data: Severe shortness of breath with elevated D-dimer levels. History of asthma. CT ANGIOGRAPHY CHEST Technique: Multidetector CT imaging of the chest using the standard protocol during bolus administration of intravenous  contrast. Multiplanar reconstructed images including MIPs were obtained and reviewed to evaluate the vascular anatomy. Contrast: 100 ml Omnipaque-300 intravenously. Comparison: 04/06/2008 chest CTA. Findings: The pulmonary arteries are well opacified with contrast. There is mild breathing artifact but no evidence of acute pulmonary embolism. The thoracic aorta appears normal. There are no enlarged mediastinal or hilar lymph nodes. Previously noted prominent soft tissue in the prevascular space is less evident, most consistent with involuting thymic tissue. There is no pleural or pericardial effusion. There are patchy perihilar pulmonary opacities bilaterally most consistent with   atelectasis. There is no consolidation or endobronchial lesion. The visualized upper abdomen is unremarkable. IMPRESSION: 1. No evidence of acute pulmonary embolism or other acute chest process. 2. Interval partial involution of previously demonstrated prominent thymic tissue. Original Report Authenticated By: William Veazey, M.D.  Dg Chest 2v Repeat Same Day  07/13/2012 *RADIOLOGY REPORT* Clinical Data: Chest pain, shortness of breath, cough, fever CHEST - 2 VIEW SAME DAY Comparison: 07/13/2012 at 1240 hours Findings: Mild bronchitic changes. No focal consolidation. No pleural effusion or pneumothorax. Cardiomediastinal silhouette is within normal limits. Visualized osseous structures are within normal limits. IMPRESSION: Mild bronchitic changes. Original Report Authenticated By: Sriyesh Krishnan, M.D.   Assessment and Plan:  Jeananne Escalante is a 31 y.o. year old female presenting with likely viral bronchitis induced reactive airway disease and hypoxemia.   # Febrile Illness - Likely acute viral bronchitis, possibly caused by influenza  - Obtain influenza culture  - Start tamiflu 75 mg PO BID  - Tylenol 650 mg q 6h   # Hypoxemia - Likely secondary to exacerbation of asthma from respiratory infection  - Cont O2 support as needed    - Albuterol/Atrovent nebs 6 6  - Albuterol q 2 PRN  - Prednisone 60 mg PO daily x 5 days  - AM Chest X-ray   #Dehydration - less than 10% of body  - Replete with NS bolus followed by maintenance fluid   # Allergic Rhinits  - Continue home fluticasone an anti-histamine   #Hypothyroidism  - Continue home synthroid   # Depression  - Continue home celexa   FENGI: regular diet, NS @ 75 mL/hr  PPX: Heparin 5000 U TID  Dispo: Admit to inpatient Family Medicine Service   Assia Meanor V. Jaileigh Weimer, MD, MBA  07/13/2012, 10:29 PM  Family Medicine Resident, PGY-2  [phone] pager  

## 2012-07-13 NOTE — Progress Notes (Signed)
Patient ID: Colleen Peck, female   DOB: Dec 25, 1980, 32 y.o.   MRN: [id number] Colleen Peck is a 32 y.o. female who presents to [facility] today for dyspnea:  1.  Dyspnea:  32 yo F with PMH significant for asthma who presents today with 1 day history of increasing dyspnea.  Patient in her usual state of health until yesterday. She said starting yesterday morning she began having shortness of breath. She is used 3 nebulizer treatments at home. She initially felt really for this within is to return. She was up most of the night last night. Her last nebulizer treatment before coming to clinic was at 5 AM this morning. She is not able to tolerate any food or drink yesterday do to nausea. She did not actually vomit because she did try eating or drinking anything.  Does have pain centralized retrosternally. This is unchanged by coughing. She's had a dry hacking cough for most of yesterday last night. No sick contacts.  She denies having any fevers or chills at home but does describe general malaise. She also describes general myalgias. She denies any lower extremity edema. She does take oral birth control pills. She does not smoke.   The following portions of the patient's history were reviewed and updated as appropriate: allergies, current medications, past medical history, family and social history, and problem list.  Patient is a nonsmoker.  Past Medical History  Diagnosis Date  . Asthma   . Seasonal allergies   . Depression   . PTSD (post-traumatic stress disorder)   . Thyroid disease 2009    Graves disease (pt reported resolved); hypothyriodism  . Abnormal pap     pt reports abnl pap many years ago.  Nl since then.    ROS as above otherwise neg. No Chest pain, palpitations, SOB, Fever, Chills, Abd pain, N/V/D.  Medications reviewed. Current Outpatient Prescriptions  Medication Sig Dispense Refill  . albuterol (PROVENTIL HFA) 108 (90 BASE) MCG/ACT inhaler Inhale 2 puffs into the lungs every 6  (six) hours as needed. For shortness of breath, wheezing.  1 Inhaler  6  . albuterol (PROVENTIL) (2.5 MG/3ML) 0.083% nebulizer solution Take 3 mLs (2.5 mg total) by nebulization every 6 (six) hours as needed for shortness of breath. For shortness of breath  75 mL  0  . cetirizine (ZYRTEC) 10 MG tablet Take 10 mg by mouth daily.        Marland Kitchen FLUoxetine (PROZAC) 20 MG capsule Take 1 capsule (20 mg total) by mouth daily.  30 capsule  2  . fluticasone (FLONASE) 50 MCG/ACT nasal spray Place 2 sprays into the nose daily.  16 g  6  . levothyroxine (SYNTHROID, LEVOTHROID) 150 MCG tablet Take 1 tablet (150 mcg total) by mouth daily.  30 tablet  2  . [DISCONTINUED] benztropine (COGENTIN) 1 MG tablet Take 0.5 mg by mouth daily.        Exam:  BP 111/72  Pulse 128  Temp 103.2 F (39.6 C) (Oral)  Ht 5\' 7"  (1.702 m)  SpO2 91% Gen: African American female sitting in examination chair. She is in moderate distress. She is having increased work of breathing. HEENT: EOMI,  PERRL, Dry mucus membranes Neck:  No JVD, trachea midline Lungs:  Diffuse wheezing all lung fields Heart:  Tachycardic with regular rhythm. No murmur  Abd: NABS, NT, ND Exts: Non edematous BL  LE, warm and well perfused.  Neuro: Alert and oriented x3. No focal deficits.  No results found for this or  any previous visit (from the past 72 hour(s)).

## 2012-07-13 NOTE — ED Notes (Signed)
Pt transported to Xray. 

## 2012-07-13 NOTE — Assessment & Plan Note (Addendum)
Patient presented dyspneic with fever to 103.2. We tried albuterol treatment which cleared her wheezing.  Lung exam without wheezig s/p 1 neb treatment. However her work of breathing did not diminish. She is pulse ox saturations 90-91% off of O2 initially.  We placed her on supplemental O2 at 2 liters which brought her pulse ox to 94%. her pulse ox count came up to 100% after receiving her first albuterol treatment.  We waited about 15 minutes and her pulse ox dropped back down to 90% off of O2.  Lungs continued to be clear.  Patient provided 800 mg Ibuprofen for fever relief here as well as relief of myalgias.   We attempted another albuterol treatment. However as patient is persistently dyspneic and complaining of chest pain and in moderate distress we decided to send her to the emergency room for further evaluation. Initial thought was likely flu due to her symptoms with this causing asthma exacerbation. However lungs were completely clear after her first nebulizer treatment. Would like to rule out any further cause of tachycardia, chest pain, dyspnea such as PE in this obese patient on birth control pills.  However I do not note any LE edema.

## 2012-07-13 NOTE — ED Notes (Signed)
Pt from family practice with fever, n/v and SOB that started this morning. Pt is diaphoretic and speaking in incomplete sentences. States she came in today for asthma and fever to family medicine.

## 2012-07-13 NOTE — Progress Notes (Signed)
Patient admitted to 5527 from ED. Patient is A&Ox3. Patient's skin is warm, dry and intact. Patient placed on droplet precautions for cough and fever and r/o flu.  Explained to patient and sister droplet precautions, they stated understanding.  Patient lives at home with mother and sister. Oriented patient to unit and room. Will continue to monitor patient. Nelda Marseille, RN

## 2012-07-13 NOTE — ED Notes (Signed)
O2 SATS 85-93 ON ROOM AIR, PT COMPLAINED OF BEING SHORT OF BREATH.  PUT PT BACK ON 2LNC.  O2 SAT INCREASED TO 94%

## 2012-07-14 ENCOUNTER — Inpatient Hospital Stay (HOSPITAL_COMMUNITY): Payer: No Typology Code available for payment source

## 2012-07-14 DIAGNOSIS — J101 Influenza due to other identified influenza virus with other respiratory manifestations: Secondary | ICD-10-CM | POA: Diagnosis present

## 2012-07-14 LAB — CBC
MCV: 84.9 fL (ref 78.0–100.0)
Platelets: 210 10*3/uL (ref 150–400)
RBC: 3.72 MIL/uL — ABNORMAL LOW (ref 3.87–5.11)
WBC: 4.9 10*3/uL (ref 4.0–10.5)

## 2012-07-14 LAB — INFLUENZA PANEL BY PCR (TYPE A & B)
H1N1 flu by pcr: DETECTED — AB
Influenza A By PCR: POSITIVE — AB
Influenza B By PCR: NEGATIVE

## 2012-07-14 LAB — BASIC METABOLIC PANEL
CO2: 24 mEq/L (ref 19–32)
Calcium: 8.4 mg/dL (ref 8.4–10.5)
Sodium: 138 mEq/L (ref 135–145)

## 2012-07-14 LAB — TSH: TSH: 0.249 u[IU]/mL — ABNORMAL LOW (ref 0.350–4.500)

## 2012-07-14 MED ORDER — DIPHENHYDRAMINE HCL 25 MG PO CAPS
25.0000 mg | ORAL_CAPSULE | ORAL | Status: DC | PRN
  Administered 2012-07-14: 25 mg via ORAL
  Filled 2012-07-14: qty 1

## 2012-07-14 MED ORDER — PREDNISONE 20 MG PO TABS: 60.0000 mg | ORAL_TABLET | Freq: Every day | ORAL | Status: DC

## 2012-07-14 MED ORDER — IBUPROFEN 800 MG PO TABS
800.0000 mg | ORAL_TABLET | Freq: Three times a day (TID) | ORAL | Status: DC | PRN
  Administered 2012-07-14: 800 mg via ORAL
  Filled 2012-07-14 (×2): qty 1

## 2012-07-14 MED ORDER — OSELTAMIVIR PHOSPHATE 75 MG PO CAPS: 75.0000 mg | ORAL_CAPSULE | Freq: Two times a day (BID) | ORAL | Status: DC

## 2012-07-14 MED ORDER — DIPHENHYDRAMINE HCL 25 MG PO CAPS: 25.0000 mg | ORAL_CAPSULE | ORAL | Status: DC | PRN

## 2012-07-14 MED ORDER — ACETAMINOPHEN 325 MG PO TABS: 650.0000 mg | ORAL_TABLET | Freq: Four times a day (QID) | ORAL | Status: DC | PRN

## 2012-07-14 MED ORDER — ONDANSETRON HCL 4 MG PO TABS: 4.0000 mg | ORAL_TABLET | Freq: Four times a day (QID) | ORAL | Status: DC | PRN

## 2012-07-14 NOTE — Progress Notes (Signed)
Notified Dr. Clinton Sawyer that patient's temp is 103.1. Just gave Tylenol as ordered. No new orders given. Will continue to monitor. Nelda Marseille, RN

## 2012-07-14 NOTE — Progress Notes (Signed)
FMTS Attending Daily Note: Colleen Lindeman MD [phone] pager office [phone] I  have seen and examined this patient, reviewed their chart. I have discussed this patient with the resident. I agree with the resident's findings, assessment and care plan. 

## 2012-07-14 NOTE — Discharge Summary (Signed)
Family Medicine Teaching [facility] Discharge Summary  Patient name: Colleen Peck Medical record number: [id number] Date of birth: 1980/09/08 Age: 32 y.o. Gender: female Date of Admission: 07/13/2012  Date of Discharge: 07/14/2012 Admitting Physician: Nestor Ramp, MD  Primary Care Provider: Gaspar Bidding, DO  Indication for Hospitalization: fever, hypoxemia Discharge Diagnoses:  Influenza A (H1N1) Hypoxemia Acute viral bronchitis Mild dehydration asthma exacerbation Asthma, intermittent Hypothyroidism  Consultations: none  Significant Labs and Imaging:  Lab  07/14/12 0515  07/13/12 1219   WBC  4.9  8.6   HGB  10.1*  9.8*   HCT  31.6*  30.7*   PLT  210  216    Lab  07/14/12 0515  07/13/12 1219   NA  138  135   K  3.4*  3.8   CL  102  99   CO2  24  22   BUN  6  5*   CREATININE  0.76  0.71   CALCIUM  8.4  9.2   PROT  --  --   BILITOT  --  --   ALKPHOS  --  --   ALT  --  --   AST  --  --   GLUCOSE  94  102*     07/13/2012 20:41  Influenza A By PCR POSITIVE (A)  Influenza B By PCR NEGATIVE  H1N1 flu by pcr DETECTED (A)   TSH, 1/3: 0.249 (L)  Imaging: CTA, Chest, 1/2 @1724   IMPRESSION:  1. No evidence of acute pulmonary embolism or other acute chest  process.  2. Interval partial involution of previously demonstrated  prominent thymic tissue.   CXR, 1/3 @0659   Findings: Mild peribronchial thickening. No confluent airspace  opacities. Heart is normal size. Small effusions noted on the  lateral view. No acute bony abnormality.  IMPRESSION:  Stable bronchitic changes. Small bilateral effusions.  Procedures: none  Brief Hospital Course: Colleen Peck is a 32 y.o. year old female presenting with likely viral bronchitis induced reactive airway disease and hypoxemia. Pt was admitted 1/2 with fever (Tmax 102.5) and O2 requirement of 2L by [place]. Influenza panel found to be positive for Influenza A, H1N1. Pt's fever improved and her subjective complaints are  improved, at time of discharge. Pt comfortable being discharged with supportive care and close PCP follow-up. See below by problem list.  # Febrile Illness - Presumed likely acute viral bronchitis, possibly influenza at time of discharge; influenza panel positive, as above. Pt with intermittent fever, trending down. CXR consistent with viral infection.   - Started on Tamiflu 75 mg PO BID, to continue for a total of 5 days.  - Pt was instructed to maintain good fluid intake and to take Tylenol 650 mg q 6h for fever. Also discussed red flags to call clinic or to return to the ED.   # Hypoxemia - Likely secondary to exacerbation of asthma from respiratory infection. Pt required 2L [place] in the ED and overnight 1/2-1/3  - Pt's O2 requirement resolved overnight.   - Pt was treated with albuterol/Atrovent nebs q6 scheduled with q2 PRN (pt did not require PRN). Instructed to use albuterol PRN per previous Rx, at time of discharge.  - Also prescribed prednisone 60 mg PO daily x 5 days for component of asthma exacerbation (1/3 is day 1)   #Rash/urticaria - per pt, occurs occasionally with asthma flares. Resolved with Benadryl, pt instructed to continue PRN after discharge.  #Dehydration - less than 10% of bodyweight. Improved s/p  NS bolus and IVF. Encouraged good fluid intake, as above.  # Allergic Rhinits - Treated with home fluticasone, loratadine substituted for home cetirizine. Continued fluticasone and loratadine at time of discharge.  #Hypothyroidism - Continued home Synthroid 150 mcg daily. TSH low at 0.249. See recommendations, below.  # Depression - Continued home Celexa.  Discharge Medications:    Medication List     As of 07/14/2012  3:08 PM    TAKE these medications         acetaminophen 325 MG tablet   Commonly known as: TYLENOL   Take 2 tablets (650 mg total) by mouth every 6 (six) hours as needed for fever.      albuterol (2.5 MG/3ML) 0.083% nebulizer solution   Commonly known as:  PROVENTIL   Take 3 mLs (2.5 mg total) by nebulization every 6 (six) hours as needed for shortness of breath. For shortness of breath      albuterol 108 (90 BASE) MCG/ACT inhaler   Commonly known as: PROVENTIL HFA;VENTOLIN HFA   Inhale 2 puffs into the lungs every 6 (six) hours as needed. For shortness of breath, wheezing.      cetirizine 10 MG tablet   Commonly known as: ZYRTEC   Take 10 mg by mouth daily.      diphenhydrAMINE 25 mg capsule   Commonly known as: BENADRYL   Take 1 capsule (25 mg total) by mouth every 4 (four) hours as needed for itching or sleep.      FLUoxetine 20 MG capsule   Commonly known as: PROZAC   Take 1 capsule (20 mg total) by mouth daily.      fluticasone 50 MCG/ACT nasal spray   Commonly known as: FLONASE   Place 2 sprays into the nose daily.      levothyroxine 150 MCG tablet   Commonly known as: SYNTHROID, LEVOTHROID   Take 1 tablet (150 mcg total) by mouth daily.      ondansetron 4 MG tablet   Commonly known as: ZOFRAN   Take 1 tablet (4 mg total) by mouth every 6 (six) hours as needed for nausea.      oseltamivir 75 MG capsule   Commonly known as: TAMIFLU   Take 1 capsule (75 mg total) by mouth 2 (two) times daily. Take 1 tablet tonight, then 1 tablet twice per day for 4 more days.      predniSONE 20 MG tablet   Commonly known as: DELTASONE   Take 3 tablets (60 mg total) by mouth daily with breakfast. Take for a total of 4 more days (first day 1/4, last day 1/7).        Disposition: discharge home  Issues for Follow Up:  1. Flu/Respiratory - Pt is being treated for influenza A (H1N1), with 5 days of Tamiflu. Also likely component of asthma exacerbation, to be treated with prednisone 60 mg daily for 5 days. Instructed pt the importance of supportive therapy, especially fluid intake. Instructed pt to f/u with PCP 1/7. 2. Hypothyroidism - Pt noted to have TSH of 0.249 at time of discharge. With acute illness, will defer any adjustment of  Synthroid to PCP/follow-up.  Outstanding Results: none  Discharge Instructions: Please refer to Patient Instructions section of EMR for full details.  Patient was counseled important signs and symptoms that should prompt return to medical care, changes in medications, dietary instructions, activity restrictions, and follow up appointments.       Follow-up Information    Follow up with RIGBY,  MICHAEL, DO. On 07/18/2012. (Appt at 9:15 AM.)    Contact information:   [address] [place] [address] [phone]         Discharge Condition: stable  [address] [place], MD 07/14/2012, 3:08 PM

## 2012-07-14 NOTE — Progress Notes (Signed)
FMTS Attending Daily Note: Lesbia Ottaway MD [phone] pager office [phone] I  have seen and examined this patient, reviewed their chart. I have discussed this patient with the resident. I agree with the resident's findings, assessment and care plan. 

## 2012-07-14 NOTE — Progress Notes (Signed)
Family Medicine Teaching Service Daily Progress Note Intern Pager: [phone]  Patient name: Colleen Peck Medical record number: [id number] Date of birth: 1981-06-17 Age: 32 y.o. Gender: female  Primary Care Provider: RIGBY, MICHAEL, DO  Subjective: Pt seen at bedside. Mother present in room. Pt states she feels somewhat better this morning, breathing room air. Still with some chest pain on the right with deep breathing, and cough, but overall breathing better. Some hives on her abdomen and back, improved after Benadryl; states she sometimes gets hives with asthma or when "her thyroid acts up."  Objective: Temp:  [99.1 F (37.3 C)-103.1 F (39.5 C)] 100.2 F (37.9 C) (01/03 0634) Pulse Rate:  [106-136] 107  (01/03 0350) Resp:  [15-32] 32  (01/03 0350) BP: (93-120)/(32-72) 106/64 mmHg (01/03 0350) SpO2:  [85 %-100 %] 99 % (01/03 0350) Weight:  [236 lb 1.8 oz (107.1 kg)] 236 lb 1.8 oz (107.1 kg) (01/02 2040) Exam: General: young adult female, initially sleepy but easily rousable, able to sit up and move without assistance, appears ill HEENT: MMM moist, EOMI, conjunctivae clear; minimal rhinorrhea Cardiovascular: RRR, normal S1/S2, no murmur appreciated Respiratory: relatively clear with some scattered coarse breath sounds, without increased work of breathing, but poor effort of inspiration Abdomen: soft, nontender, BS+; faint resolving urticaria to low abdomen and flanks, nontender Extremities: warm, dry, intact, without edema  Laboratory:  Lab 07/14/12 0515 07/13/12 1219  WBC 4.9 8.6  HGB 10.1* 9.8*  HCT 31.6* 30.7*  PLT 210 216    Lab 07/14/12 0515 07/13/12 1219  NA 138 135  K 3.4* 3.8  CL 102 99  CO2 24 22  BUN 6 5*  CREATININE 0.76 0.71  CALCIUM 8.4 9.2  PROT -- --  BILITOT -- --  ALKPHOS -- --  ALT -- --  AST -- --  GLUCOSE 94 102*    Imaging/Diagnostic Tests: CTA, Chest, 1/2 @1724  IMPRESSION:  1. No evidence of acute pulmonary embolism or other acute chest    process.  2. Interval partial involution of previously demonstrated  prominent thymic tissue.  CXR, 1/3 @0659  Findings: Mild peribronchial thickening. No confluent airspace  opacities. Heart is normal size. Small effusions noted on the  lateral view. No acute bony abnormality.  IMPRESSION:  Stable bronchitic changes. Small bilateral effusions.  Assessment and Plan: Colleen Peck is a 32 y.o. year old female presenting with likely viral bronchitis induced reactive airway disease and hypoxemia.   # Febrile Illness - Likely acute viral bronchitis, possibly influenza; intermittent fever, trending down, CXR consistent with viral infection - Influenza panel pending - Started on Tamiflu 75 mg PO BID - Tylenol 650 mg q 6h for fever  # Hypoxemia - Likely secondary to exacerbation of asthma from respiratory infection - Cont O2 support as needed - Albuterol/Atrovent nebs q6 scheduled - Albuterol q 2 PRN - Prednisone 60 mg PO daily x 5 days (1/3 is day 1)  #Rash/urticaria - per pt, occurs occasionally with asthma flares -resolving with Benadryl, continue PRN -will monitor  #Dehydration  - less than 10% of body  - improving s/p NS bolus and IVF  # Allergic Rhinits - Continue home fluticasone, loratadine sub for home cetirizine  #Hypothyroidism - Continue home synthroid  -TSH pending  # Depression - Continue home celexa   FENGI: regular diet, NS @ 75 mL/hr PPX: Heparin 5000 U TID Dispo: Management as above; possible D/C home today with supportive therapy for viral illness  Lucianne Smestad, [place], MD 07/14/2012, 9:43 AM

## 2012-07-14 NOTE — H&P (Signed)
FMTS Attending Admission Note: Blayn Whetsell MD [phone] pager office [phone] I  have seen and examined this patient, reviewed their chart. I have discussed this patient with the resident. I agree with the resident's findings, assessment and care plan. 

## 2012-07-14 NOTE — H&P (Signed)
Family Medicine Teaching [facility] Admission History and Physical  Patient name: Colleen Peck Medical record number: [id number]  Date of birth: [birth year] Age: 32 y.o. Gender: female   Primary Care Provider: Gaspar Bidding, DO   Chief Complaint: Dyspnea  History of Present Illness: Colleen Peck is a 32 y.o. year old female with obesity, asthma, and anxiety who presented to the ED today with dyspnea, fever, and tachycardia. She was found to be mildly hypoxemic to 90 on room air and have an elevated D-dimer. A CT-Angiogram ruled out pulmonary embolism, but the patient was admitted for hypoxemia requirin 2L of O2 via N/C. She notes that the symptoms started yesterday with nasal drainage, which progressed to shortness of breath, pleuritic chest pain, fever, and a "racing heart." She has had multiple sick contacts: her mother with whom she lives who had pneumonia and her co-worker had bronchitis and sinus infection.   Patient Active Problem List   Diagnosis   .  OBESITY, NOS   .  RHINITIS, ALLERGIC   .  ASTHMA, INTERMITTENT   .  ECZEMA, ATOPIC DERMATITIS   .  PALPITATIONS   .  ANGIOEDEMA   .  Depression with anxiety   .  Major depressive disorder, recurrent episode, severe, without mention of psychotic behavior   .  Posttraumatic stress disorder   .  Hypothyroidism (acquired)   .  Galactorrhea   .  Preventive measure   .  Dyspnea    Past Medical History:  Past Medical History   Diagnosis  Date   .  Asthma    .  Seasonal allergies    .  Depression    .  PTSD (post-traumatic stress disorder)    .  Thyroid disease  2009     Graves disease (pt reported resolved); hypothyriodism   .  Abnormal pap      pt reports abnl pap many years ago. Nl since then.    Past Surgical History:  Past Surgical History   Procedure  Date   .  Dilation and curettage of uterus  March 2006    Social History:  History    Social History   .  Marital Status:  Single     Spouse Name:  N/A    Number of Children:  N/A   .  Years of Education:  N/A    Social History Main Topics   .  Smoking status:  Never Smoker   .  Smokeless tobacco:  Never Used   .  Alcohol Use:  No      Comment: past use of alcohol in '08-'09   .  Drug Use:  No      Comment: past use of marijuana in '08-'09.   Marland Kitchen  Sexually Active:  Not Currently     Birth Control/ Protection:  Abstinence    Other Topics  Concern   .  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    Family History:  Family History   Problem  Relation  Age of Onset   .  Drug abuse  Father    .  Depression  Maternal Aunt    .  Depression  Maternal Grandmother    .  Anxiety disorder  Maternal Grandmother    .  COPD  Maternal Grandmother    .  Suicidality  Cousin    .  Depression  Cousin    .  Depression  Maternal Aunt    .  Hypertension  Mother    .  Diabetes  Paternal Grandfather    .  COPD  Paternal Grandmother    .  Heart disease  Neg Hx     Allergies:  Allergies   Allergen  Reactions   .  Penicillins      REACTION: Questionable allergy- rash    Current Facility-Administered Medications   Medication  Dose  Route  Frequency  Provider  Last Rate  Last Dose   .  antiseptic oral rinse (BIOTENE) solution 15 mL  15 mL  Mouth Rinse  BID  Nestor Ramp, MD     .  iohexol (OMNIPAQUE) 350 MG/ML injection 80 mL  80 mL  Intravenous  Once PRN  Medication Radiologist, MD     .  pneumococcal 23 valent vaccine (PNU-IMMUNE) injection 0.5 mL  0.5 mL  Intramuscular  Tomorrow-1000  Nestor Ramp, MD      Review Of Systems: Per HPI with the following additions: nausea  Otherwise 12 point review of systems was performed and was unremarkable.  Physical Exam:  BP 113/72  Pulse 111  Temp 100.5 F (38.1 C) (Oral)  Resp 20  Ht 5\' 7"  (1.702 m)  Wt 236 lb 1.8 oz (107.1 kg)  BMI 36.98 kg/m2  SpO2 99%  LMP 07/06/2012  General: alert and cooperative, ill appearing    HEENT: extra ocular movement intact and sclera clear, anicteric; OP dry with cracked lips, shotty submandibular lymphadenopathy bilaterally  Heart: sinus tachycardia  Lungs: mildly increased work of breathing, decrease air movement in bases, scattered rhonchi, no wheezes or rales  Abdomen: abdomen is soft without significant tenderness, masses, organomegaly or guarding  Extremities: extremities normal, atraumatic, no cyanosis or edema  Skin:no rashes, warm, dry  Neurology: normal without focal findings, mental status, speech normal, alert and oriented x3, PERLA and reflexes normal and symmetric  Labs and Imaging:  Results for orders placed during the hospital encounter of 07/13/12 (from the past 24 hour(s))   CBC WITH DIFFERENTIAL Status: Abnormal    Collection Time    07/13/12 12:19 PM   Component  Value  Range    WBC  8.6  4.0 - 10.5 K/uL    RBC  3.64 (*)  3.87 - 5.11 MIL/uL    Hemoglobin  9.8 (*)  12.0 - 15.0 g/dL    HCT  40.9 (*)  81.1 - 46.0 %    MCV  84.3  78.0 - 100.0 fL    MCH  26.9  26.0 - 34.0 pg    MCHC  31.9  30.0 - 36.0 g/dL    RDW  91.4  78.2 - 95.6 %    Platelets  216  150 - 400 K/uL    Neutrophils Relative  93 (*)  43 - 77 %    Neutro Abs  7.9 (*)  1.7 - 7.7 K/uL    Lymphocytes Relative  4 (*)  12 - 46 %    Lymphs Abs  0.3 (*)  0.7 - 4.0 K/uL    Monocytes Relative  3  3 - 12 %    Monocytes Absolute  0.3  0.1 - 1.0 K/uL    Eosinophils Relative  1  0 - 5 %    Eosinophils Absolute  0.0  0.0 - 0.7 K/uL    Basophils Relative  0  0 - 1 %    Basophils Absolute  0.0  0.0 - 0.1  K/uL   BASIC METABOLIC PANEL Status: Abnormal    Collection Time    07/13/12 12:19 PM   Component  Value  Range    Sodium  135  135 - 145 mEq/L    Potassium  3.8  3.5 - 5.1 mEq/L    Chloride  99  96 - 112 mEq/L    CO2  22  19 - 32 mEq/L    Glucose, Bld  102 (*)  70 - 99 mg/dL    BUN  5 (*)  6 - 23 mg/dL    Creatinine, Ser  4.09  0.50 - 1.10 mg/dL    Calcium  9.2  8.4 - 10.5 mg/dL    GFR calc  non Af Amer  >90  >90 mL/min    GFR calc Af Amer  >90  >90 mL/min   URINALYSIS, ROUTINE W REFLEX MICROSCOPIC Status: Normal    Collection Time    07/13/12 2:55 PM   Component  Value  Range    Color, Urine  YELLOW  YELLOW    APPearance  CLEAR  CLEAR    Specific Gravity, Urine  1.014  1.005 - 1.030    pH  5.5  5.0 - 8.0    Glucose, UA  NEGATIVE  NEGATIVE mg/dL    Hgb urine dipstick  NEGATIVE  NEGATIVE    Bilirubin Urine  NEGATIVE  NEGATIVE    Ketones, ur  NEGATIVE  NEGATIVE mg/dL    Protein, ur  NEGATIVE  NEGATIVE mg/dL    Urobilinogen, UA  0.2  0.0 - 1.0 mg/dL    Nitrite  NEGATIVE  NEGATIVE    Leukocytes, UA  NEGATIVE  NEGATIVE   POCT PREGNANCY, URINE Status: Normal    Collection Time    07/13/12 3:03 PM   Component  Value  Range    Preg Test, Ur  NEGATIVE  NEGATIVE   D-DIMER, QUANTITATIVE Status: Abnormal    Collection Time    07/13/12 3:46 PM   Component  Value  Range    D-Dimer, Quant  1.32 (*)  0.00 - 0.48 ug/mL-FEU    Dg Chest 2 View  07/13/2012 *RADIOLOGY REPORT* Clinical Data: Fever, emesis, short of breath CHEST - 2 VIEW Comparison: Prior chest x-ray 04/24/2011; prior chest CT 04/06/2008 Findings: Interval blunting left costophrenic angle on the frontal view. No large pleural effusion seen on the lateral view. Cardiac and mediastinal contours within normal limits. Lungs are mildly hypoinflated. No focal airspace consolidation or pulmonary edema. No acute osseous abnormality. IMPRESSION: Interval development of blunting of the left costophrenic angle may represent subsegmental atelectasis, early infiltrate or trace left pleural effusion. Original Report Authenticated By: Malachy Moan, M.D.  Ct Angio Chest Pe W/cm &/or Wo Cm  07/13/2012 *RADIOLOGY REPORT* Clinical Data: Severe shortness of breath with elevated D-dimer levels. History of asthma. CT ANGIOGRAPHY CHEST Technique: Multidetector CT imaging of the chest using the standard protocol during bolus administration of intravenous  contrast. Multiplanar reconstructed images including MIPs were obtained and reviewed to evaluate the vascular anatomy. Contrast: 100 ml Omnipaque-300 intravenously. Comparison: 04/06/2008 chest CTA. Findings: The pulmonary arteries are well opacified with contrast. There is mild breathing artifact but no evidence of acute pulmonary embolism. The thoracic aorta appears normal. There are no enlarged mediastinal or hilar lymph nodes. Previously noted prominent soft tissue in the prevascular space is less evident, most consistent with involuting thymic tissue. There is no pleural or pericardial effusion. There are patchy perihilar pulmonary opacities bilaterally most consistent with  atelectasis. There is no consolidation or endobronchial lesion. The visualized upper abdomen is unremarkable. IMPRESSION: 1. No evidence of acute pulmonary embolism or other acute chest process. 2. Interval partial involution of previously demonstrated prominent thymic tissue. Original Report Authenticated By: Carey Bullocks, M.D.  Dg Chest 2v Repeat Same Day  07/13/2012 *RADIOLOGY REPORT* Clinical Data: Chest pain, shortness of breath, cough, fever CHEST - 2 VIEW SAME DAY Comparison: 07/13/2012 at 1240 hours Findings: Mild bronchitic changes. No focal consolidation. No pleural effusion or pneumothorax. Cardiomediastinal silhouette is within normal limits. Visualized osseous structures are within normal limits. IMPRESSION: Mild bronchitic changes. Original Report Authenticated By: Charline Bills, M.D.   Assessment and Plan:  Colleen Peck is a 32 y.o. year old female presenting with likely viral bronchitis induced reactive airway disease and hypoxemia.   # Febrile Illness - Likely acute viral bronchitis, possibly caused by influenza  - Obtain influenza culture  - Start tamiflu 75 mg PO BID  - Tylenol 650 mg q 6h   # Hypoxemia - Likely secondary to exacerbation of asthma from respiratory infection  - Cont O2 support as needed    - Albuterol/Atrovent nebs 6 6  - Albuterol q 2 PRN  - Prednisone 60 mg PO daily x 5 days  - AM Chest X-ray   #Dehydration - less than 10% of body  - Replete with NS bolus followed by maintenance fluid   # Allergic Rhinits  - Continue home fluticasone an anti-histamine   #Hypothyroidism  - Continue home synthroid   # Depression  - Continue home celexa   FENGI: regular diet, NS @ 75 mL/hr  PPX: Heparin 5000 U TID  Dispo: Admit to inpatient Family Medicine Service   Si Raider. Clinton Sawyer, MD, MBA  07/13/2012, 10:29 PM  Family Medicine Resident, PGY-2  [phone] pager

## 2012-07-14 NOTE — Progress Notes (Signed)
Notified Dr. Clinton Sawyer that patient has hives on lower abdomen this morning. Patient and patient's sister states that hives come over entire body when her Grave's disease starts acting up.  Patient's sister states that she has been saying throughout night that she is dizzy and is going to pass out. Patient continues to have fever. Placed ice packs to back of neck and bilateral arm pits. No new orders given. Will continue to monitor patient. Nelda Marseille, RN

## 2012-07-14 NOTE — Progress Notes (Signed)
Notified Dr. Clinton Sawyer that patient's temp is now 102.5 and respirations 32. Dr. Clinton Sawyer gave order for Ibuprofen. Will continue to monitor patient. Nelda Marseille, RN

## 2012-07-14 NOTE — Progress Notes (Signed)
UR COMPLETED  

## 2012-07-14 NOTE — ED Provider Notes (Signed)
Medical screening examination/treatment/procedure(s) were performed by non-physician practitioner and as supervising physician I was immediately available for consultation/collaboration.  Broc Caspers L Rye Decoste, MD 07/14/12 1217 

## 2012-07-14 NOTE — Progress Notes (Signed)
Pt. discharged to floor,verbalized understanding of discharged instruction,medication,restriction,diet and follow up appointment.Baseline Vitals sign stable,Pt comfortable,no sign and symptom of distress. 

## 2012-07-17 NOTE — Discharge Summary (Signed)
Family Medicine Teaching Service  Discharge Note : Attending Raya Mckinstry MD Pager [phone] Office [phone] I have seen and examined this patient, reviewed their chart and discussed discharge planning wit the resident at the time of discharge. I agree with the discharge plan as above.  

## 2012-07-18 ENCOUNTER — Encounter: Payer: Self-pay | Admitting: Sports Medicine

## 2012-07-18 ENCOUNTER — Ambulatory Visit (INDEPENDENT_AMBULATORY_CARE_PROVIDER_SITE_OTHER): Payer: No Typology Code available for payment source | Admitting: Sports Medicine

## 2012-07-18 VITALS — BP 105/65 | HR 84 | Temp 99.3°F | Ht 67.0 in | Wt 235.0 lb

## 2012-07-18 DIAGNOSIS — E039 Hypothyroidism, unspecified: Secondary | ICD-10-CM

## 2012-07-18 DIAGNOSIS — J101 Influenza due to other identified influenza virus with other respiratory manifestations: Secondary | ICD-10-CM

## 2012-07-18 DIAGNOSIS — E669 Obesity, unspecified: Secondary | ICD-10-CM

## 2012-07-19 NOTE — ED Provider Notes (Signed)
Medical screening examination/treatment/procedure(s) were performed by non-physician practitioner and as supervising physician I was immediately available for consultation/collaboration.   Jefry Lesinski, MD 07/19/12 0722 

## 2012-07-21 NOTE — Assessment & Plan Note (Signed)
Significantly improving dietary intake per report >consider 3 day food log

## 2012-07-21 NOTE — Assessment & Plan Note (Signed)
Will recheck TSH in 6 weeks given ?sick thyroid syndrome although normally TSH high.  Pt reports not wanting to make changes today due to feeling fine on current dose >TSH in 6 weeks

## 2012-07-21 NOTE — Progress Notes (Signed)
  [facility]  Patient name: Colleen Peck MRN [id number]  Date of birth: 12-07-1980  CC & HPI:  Colleen Peck is a 32 y.o. female presenting today for hospital follow up   # Flu/Respiratory:  Feeling significantly improved.  Taking good PO.  mild cough but improving, no hemoptysis.  Last day of prednisone and tamiflu.    # Hypothyroidism:  ON synthroid.  No tachycardia/palpitations or anxiety.    # Obesity:  Reports having increased fresh fruits in diet.  Plans to start adding more fresh vegetables.  Limiting processed foods  ROS:  No fevers, chills.  Appetite returning  Pertinent History Reviewed:  Medical & Surgical Hx:  Reviewed: Significant for hx of asthma, allergic rhinitis, hypothyroidism s/p radioablation in 2011 for Graves Medications: Reviewed & Updated - see associated section Social History: Reviewed -  reports that she has never smoked. She has never used smokeless tobacco.   Objective Findings:  Vitals:  Filed Vitals:   07/18/12 1001  BP: 105/65  Pulse: 84  Temp: 99.3 F (37.4 C)    PE: GENERAL:  Adult obese AA female. In no discomfort; no respiratory distress. PSYCH: Alert and appropriately interactive; Insight:Good.  Mood is euthymic   H&N: AT/[place], trachea midline EENT:  MMM, no scleral icterus, EOMi HEART: RRR, S1/S2 heard, no murmur LUNGS: CTA B, no wheezes, no crackles, slightly prolonged expiratory phase EXTREMITIES: Moves all 4 extremities spontaneously, warm well perfused, no edema, bilateral DP and PT pulses 2/4.     Assessment & Plan:

## 2012-07-21 NOTE — Assessment & Plan Note (Signed)
Finishing Tamiflu and Prednisone today >Remind about flu shot q september

## 2012-07-31 ENCOUNTER — Ambulatory Visit (INDEPENDENT_AMBULATORY_CARE_PROVIDER_SITE_OTHER): Payer: BC Managed Care – PPO | Admitting: Family Medicine

## 2012-07-31 ENCOUNTER — Encounter: Payer: Self-pay | Admitting: Family Medicine

## 2012-07-31 ENCOUNTER — Ambulatory Visit: Payer: Self-pay

## 2012-07-31 VITALS — BP 111/76 | HR 102 | Temp 98.9°F | Ht 67.0 in | Wt 241.0 lb

## 2012-07-31 DIAGNOSIS — J069 Acute upper respiratory infection, unspecified: Secondary | ICD-10-CM

## 2012-07-31 MED ORDER — BENZONATATE 100 MG PO CAPS: 100.0000 mg | ORAL_CAPSULE | Freq: Two times a day (BID) | ORAL | Status: DC | PRN

## 2012-07-31 NOTE — Patient Instructions (Addendum)
Ms. Rauls,  Thank you for coming in to see me today.   You have a viral upper respiratory infection: this cough with a viral URI can last a long time, up to 6 weeks  For this please do the following  1. Continue Flonase 2. Mucinex (guaifenesin) to loose secretions-OTC 3. Tessalon perles for cough suppresant-esp at night.  4. Ok to increase albuterol to 2-3 times daily to help with SOB.  Come back for worsening SOB, fever.   Dr. Armen Pickup

## 2012-07-31 NOTE — Assessment & Plan Note (Signed)
A: viral URI w/o evidence of asthma exacerbation.  P:  1. Continue Flonase 2. Mucinex (guaifenesin) to loose secretions-OTC 3. Tessalon perles for cough suppresant-esp at night.  4. Ok to increase albuterol to 2-3 times daily to help with SOB.  Come back for worsening SOB, fever.

## 2012-07-31 NOTE — Progress Notes (Signed)
Subjective:     Patient ID: Colleen Peck, female   DOB: 04/18/1981, 32 y.o.   MRN: [id number]  HPI 32 yo F presents for same day visit with complaint of the following:  1. Productive cough: x 3 days. Associated with SOB, sore throat x 1 day, achy chest with cough, headache with cough. She denies fever. She is taking Delsym. She is using her albuterol once daily.    Review of Systems As per HPI    Objective:   Physical Exam BP 111/76  Pulse 102  Temp 98.9 F (37.2 C) (Oral)  Ht 5\' 7"  (1.702 m)  Wt 241 lb (109.317 kg)  BMI 37.75 kg/m2  SpO2 97%  LMP 07/06/2012 General appearance: alert, cooperative and no distress Head: Normocephalic, without obvious abnormality, atraumatic Eyes: conjunctivae/corneas clear. PERRL, EOM's intact.  Ears: normal TM's and external ear canals both ears Nose: Nares normal. Septum midline. Mucosa normal. No drainage or sinus tenderness. Throat: lips, mucosa, and tongue normal; teeth and gums normal Neck: no adenopathy, no carotid bruit, no JVD, supple, symmetrical, trachea midline and thyroid not enlarged, symmetric, no tenderness/mass/nodules Lungs: clear to auscultation bilaterally Neurologic: Grossly normal    Assessment and Plan:

## 2012-08-21 ENCOUNTER — Ambulatory Visit: Payer: Self-pay | Admitting: Sports Medicine

## 2012-09-15 ENCOUNTER — Ambulatory Visit: Payer: Self-pay | Admitting: Sports Medicine

## 2012-09-27 ENCOUNTER — Ambulatory Visit: Payer: Self-pay | Admitting: Sports Medicine

## 2012-09-27 ENCOUNTER — Ambulatory Visit (INDEPENDENT_AMBULATORY_CARE_PROVIDER_SITE_OTHER): Payer: BC Managed Care – PPO | Admitting: Family Medicine

## 2012-09-27 ENCOUNTER — Encounter: Payer: Self-pay | Admitting: Family Medicine

## 2012-09-27 VITALS — BP 112/72 | Temp 97.1°F | Ht 67.0 in

## 2012-09-27 DIAGNOSIS — E039 Hypothyroidism, unspecified: Secondary | ICD-10-CM

## 2012-09-27 LAB — TSH: TSH: 2.406 u[IU]/mL (ref 0.350–4.500)

## 2012-09-27 NOTE — Progress Notes (Signed)
  Subjective:    Patient ID: Colleen Peck, female    DOB: 1981-02-27, 32 y.o.   MRN: [id number]  HPI  Colleen Peck comes in for follow up of her thyroid problems. She has hypothyroidism and is taking synthroid 150 mcg daily.  She had a low TSH in January but had been ill and she did not want to change her dose so Dr. Berline Chough did not.  Now she endorses some irritability, increased anxiousness, difficulty sleeping and is wondering if this is from the thyroid problem.   Review of Systems See HPI    Objective:   Physical Exam BP 112/72  Temp(Src) 97.1 F (36.2 C) (Oral)  Ht 5\' 7"  (1.702 m)  LMP 09/24/2012 General appearance: alert, cooperative and no distress Neck: no adenopathy, supple, symmetrical, trachea midline and thyroid not enlarged, symmetric, no tenderness/mass/nodules       Assessment & Plan:

## 2012-09-27 NOTE — Assessment & Plan Note (Signed)
Re-check TSH today.  I am concerned she may be over treated, but she has gained weight which is inconsistent.  I have also asked her to follow up with PCP for anxiety as she did ask me about Marijuana to treat anxiety today.

## 2012-09-27 NOTE — Patient Instructions (Signed)
It was nice to meet you.  We will call you in a few days with your lab results and let you know the dose of thyroid medication you need.  For now, take 1/2 a pill daily.   Please make an appointment with Dr. Berline Chough to talk about your anxiety in more depth.

## 2012-09-28 ENCOUNTER — Telehealth: Payer: Self-pay | Admitting: Family Medicine

## 2012-09-28 NOTE — Telephone Encounter (Signed)
Called patient- left message.  TSH normal, advised to take synthroid at regular 150 mcg dose.  Advised to make appointment to see Dr. Berline Chough about her symptoms.

## 2012-11-23 ENCOUNTER — Ambulatory Visit (INDEPENDENT_AMBULATORY_CARE_PROVIDER_SITE_OTHER): Payer: BC Managed Care – PPO | Admitting: Sports Medicine

## 2012-11-23 ENCOUNTER — Encounter: Payer: Self-pay | Admitting: Sports Medicine

## 2012-11-23 VITALS — BP 128/78 | HR 88 | Temp 99.4°F | Ht 67.0 in | Wt 247.0 lb

## 2012-11-23 DIAGNOSIS — E669 Obesity, unspecified: Secondary | ICD-10-CM

## 2012-11-23 DIAGNOSIS — E039 Hypothyroidism, unspecified: Secondary | ICD-10-CM

## 2012-11-23 DIAGNOSIS — F341 Dysthymic disorder: Secondary | ICD-10-CM

## 2012-11-23 DIAGNOSIS — R5383 Other fatigue: Secondary | ICD-10-CM

## 2012-11-23 DIAGNOSIS — F4310 Post-traumatic stress disorder, unspecified: Secondary | ICD-10-CM

## 2012-11-23 DIAGNOSIS — F418 Other specified anxiety disorders: Secondary | ICD-10-CM

## 2012-11-23 DIAGNOSIS — F332 Major depressive disorder, recurrent severe without psychotic features: Secondary | ICD-10-CM

## 2012-11-23 LAB — BASIC METABOLIC PANEL
Calcium: 9.2 mg/dL (ref 8.4–10.5)
Creat: 0.79 mg/dL (ref 0.50–1.10)

## 2012-11-23 LAB — CBC
Hemoglobin: 11.1 g/dL — ABNORMAL LOW (ref 12.0–15.0)
RBC: 4.17 MIL/uL (ref 3.87–5.11)
WBC: 6.2 10*3/uL (ref 4.0–10.5)

## 2012-11-23 LAB — TSH: TSH: 15.222 u[IU]/mL — ABNORMAL HIGH (ref 0.350–4.500)

## 2012-11-23 LAB — T3, FREE: T3, Free: 1.9 pg/mL — ABNORMAL LOW (ref 2.3–4.2)

## 2012-11-23 MED ORDER — TRAZODONE HCL 50 MG PO TABS: 25.0000 mg | ORAL_TABLET | Freq: Every evening | ORAL | Status: DC | PRN

## 2012-11-23 MED ORDER — FLUTICASONE PROPIONATE 50 MCG/ACT NA SUSP: 2.0000 | Freq: Every day | NASAL | Status: DC

## 2012-11-23 MED ORDER — ALBUTEROL SULFATE (2.5 MG/3ML) 0.083% IN NEBU: 2.5000 mg | INHALATION_SOLUTION | Freq: Four times a day (QID) | RESPIRATORY_TRACT | Status: DC | PRN

## 2012-11-23 MED ORDER — FEXOFENADINE HCL 60 MG PO TABS: 60.0000 mg | ORAL_TABLET | Freq: Every day | ORAL | Status: DC

## 2012-11-23 NOTE — Patient Instructions (Addendum)
It was nice to see you today.   Today we discussed: I have refilled your allergy medications  Depression with anxiety Refill your Prozac I am starting you on Trazodone to help regulate your sleep   Hypothyroidism (acquired) We are checking some labs - TSH - T3, Free   OBESITY, NOS Keep working on your nutrition and activity level. Here are some basic nutrition rules to remember:  "Eat Real Foods & Drink Real Drinks" - if you think it was made in a factory . . it is likely best to avoid it as a staple in your diet.  Limiting these types of foods to 1-2 times per week is a good idea.  Sticking with fresh fruits and vegetables as well as home cooked meals will typically provide more nutrition and less salt than prepackaged meals.     Limit the amount of sugar sweetened and artificially sweetened foods and beverages.  Sticking with water flavored with a slice of lemon, lime or orange is a great option if you want something with flavor in it.  Using flavored seltzer water to flavor plain water will also add some bite if you want something more than flavor.     Here are 2 of my favorite web sites that provide great nutrition and exercise advice.   www.[email or web address] [email or web address].be  - Basic Metabolic Panel    Please plan to return to see me in 3 weeks.  If you need anything prior to seeing me please call the clinic.  Please Bring all medications with you to each appointment.

## 2012-11-24 ENCOUNTER — Telehealth (HOSPITAL_COMMUNITY): Payer: Self-pay | Admitting: Sports Medicine

## 2012-11-24 MED ORDER — LEVOTHYROXINE SODIUM 175 MCG PO TABS: 175.0000 ug | ORAL_TABLET | Freq: Every day | ORAL | Status: DC

## 2012-11-24 NOTE — Telephone Encounter (Signed)
Elevated TSH and low T3.  Will increase levothyroxine dose. Please call patient and inform of new Rx at pharmacy.  Please ensure pt will have good compliance.

## 2012-11-24 NOTE — Telephone Encounter (Signed)
Left message on voicemail. Colleen Peck S  

## 2012-11-27 NOTE — Assessment & Plan Note (Signed)
Will check TSH and free T3 given fluctuant levels.  Unclear as to what her current self changes in medication have done been but reports skipping only 3 days.

## 2012-11-27 NOTE — Progress Notes (Signed)
  [facility]  Patient name: Colleen Peck MRN [id number]  Date of birth: [birth year]  CC & HPI:  Colleen Peck is a 32 y.o. female presenting today for follow up of:  # Thyroid:  Patient reports having issues with her thyroid medication and having symptoms concerning for hyperthyroidism so she has intermittently stop this medication.  She does report an overall weight gain and is concerned that her dosage has not been correct.  She has been unusually tired.  She has had increased anxiety and this is mainly the cause for her to stop the medication.  # Mood: She reports remove has been increasingly difficult due to increased anxiety and overall low energy level and depressed mood.  She denies any thoughts of hurting herself her reports that she is just having an increasingly difficult time handling the stresses in her life.  She has been continued be followed by psychiatry who is managing her medications.  She is not due to follow up with him for approximately one month.  She does report sleep disturbance it seems like is contributing largely to her issues.  There is no reported bipolar disorder she does have a history PTSD although unclear as to what this is regarding  # Obesity:  Reports some subjective weight gain and does not really care to know her exact weight.  ------------------------------------------------------------------------------------------------------------------ Medication Compliance: noncompliant some of the time  Diet Compliance: noncompliant much of the time ------------------------------------------------------------------------------------------------------------------ New Concerns:  # Breast lesion: Patient reports that she has had a small dot underneath her left breast that was mildly painful.  She denies any fevers or chills.  The lesion is significantly improved over the past week and has essentially gone away.  She was concerned when she may the appointment.   She denies any kind of discharge or   ROS:  PER HPI  Pertinent History Reviewed:  Medical & Surgical Hx:  Reviewed: Significant for obesity hypothyroid, allergic rhinitis, eczema, PTSD, major depressive disorder.  negative family history for breast cancer Medications: Reviewed & Updated - See associated section in EMR Social History: Reviewed -  reports that she has never smoked. She has never used smokeless tobacco.   Objective Findings:  Vitals: BP 128/78  Pulse 88  Temp(Src) 99.4 F (37.4 C) (Oral)  Ht 5\' 7"  (1.702 m)  Wt 247 lb (112.038 kg)  BMI 38.68 kg/m2  PE: GENERAL:  Adult obese African American female. In no discomfort; no respiratory distress. PSYCH: Alert and appropriately interactive; Insight:Good  denies SI, HI.  Her insight appears to be good and she appears to be mildly anxious in general.  Slightly more overwhelmed than anything. H&N: AT/[place], trachea midline EENT:  MMM, no scleral icterus, EOMi HEART: RRR, S1/S2 heard, no murmur LUNGS: CTA B, no wheezes, no crackles EXTREMITIES: Moves all 4 extremities spontaneously, warm well perfused, no edema, bilateral DP and PT pulses 2/4.   Breasts: Left breast with small area of scaling skin that has a very small nonfluctuant superficial 0.5 cm cutaneous nodule that appears to be a well-healed,comedome.  Otherwise left breast exam has normal breast tissue, no other masses.  Assessment & Plan:

## 2012-11-27 NOTE — Assessment & Plan Note (Signed)
Unclear regarding the background of PTSD but the suspected is contributing.  Encourage her to continue with counseling

## 2012-11-27 NOTE — Assessment & Plan Note (Signed)
She has been followed by psychiatry previously.  I have added trazodone to her regimen to help with sleep.  Try to avoid any negative weight effects and we'll try to avoid Remeron.  Patient appears to be clinically stable however due feel it she was significantly benefit from more regular office visits.  I encouraged her to follow up in one month.

## 2012-11-27 NOTE — Assessment & Plan Note (Addendum)
Discuss weight loss goals as well as planning.  Patient has followup nutritional counseling previously we discussed returning as needed.  Also offered her to return for made for nutrition counseling exclusively.  Suspect some weight loss may be from thyroid if abnormal

## 2013-01-06 IMAGING — CR DG CHEST 2V
2 series · 2 of 2 positions shown · non-contrast
Comparison: 04/06/2008

CLINICAL DATA: Medical clearance, cough, shortness of breath,
asthma

CHEST - 2 VIEW

[w chest pa]
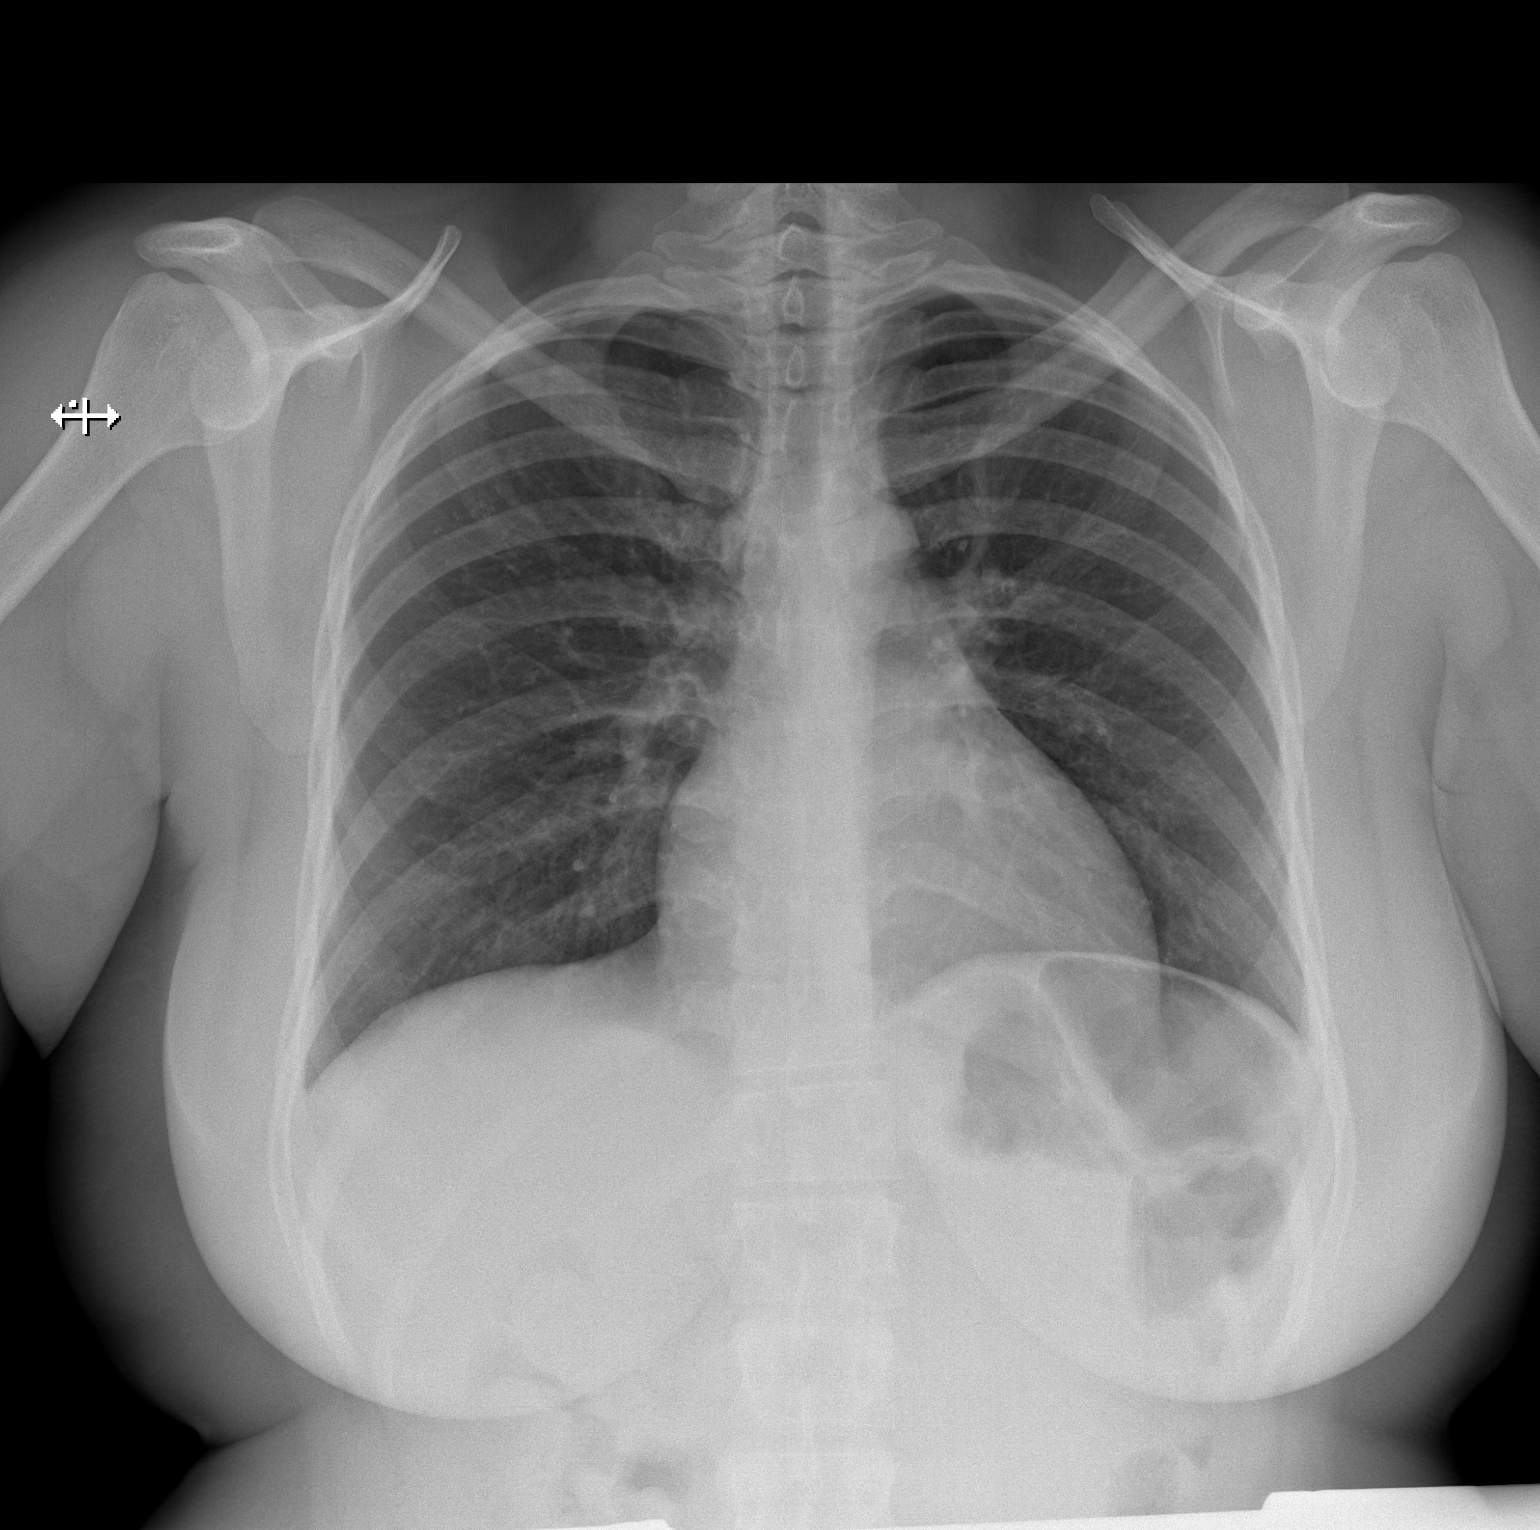

[w chest lat]
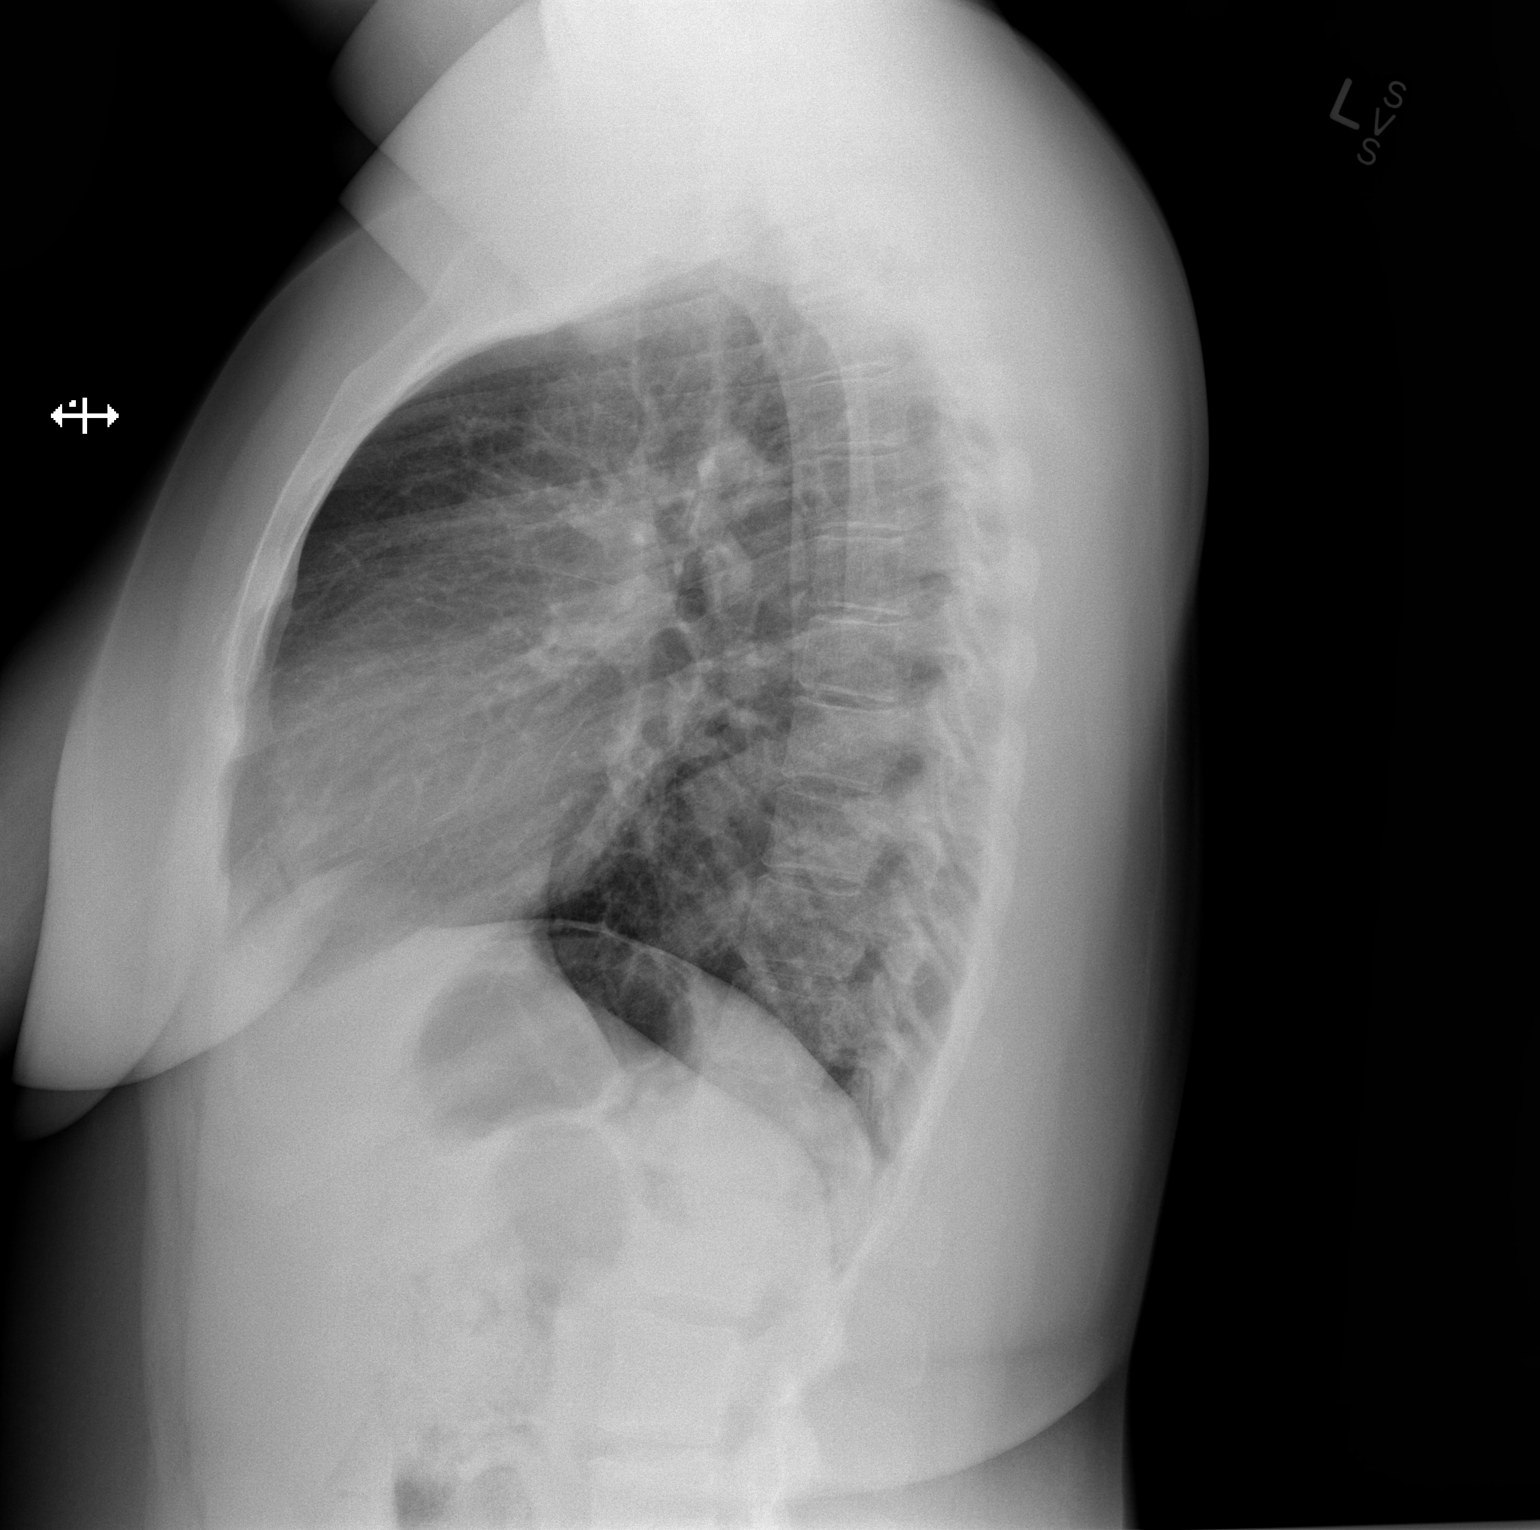

[2 of 2 positions shown; findings below may reference images not displayed]

FINDINGS: The heart size and mediastinal contours are within
normal limits.  Both lungs are clear.  The visualized skeletal
structures are unremarkable.
IMPRESSION: No active cardiopulmonary disease.

## 2013-02-21 ENCOUNTER — Ambulatory Visit (INDEPENDENT_AMBULATORY_CARE_PROVIDER_SITE_OTHER): Payer: BC Managed Care – PPO | Admitting: Sports Medicine

## 2013-02-21 ENCOUNTER — Encounter: Payer: Self-pay | Admitting: Sports Medicine

## 2013-02-21 VITALS — BP 107/66 | HR 71 | Temp 99.2°F | Ht 67.0 in | Wt 247.0 lb

## 2013-02-21 DIAGNOSIS — F341 Dysthymic disorder: Secondary | ICD-10-CM

## 2013-02-21 DIAGNOSIS — F418 Other specified anxiety disorders: Secondary | ICD-10-CM

## 2013-02-21 DIAGNOSIS — E039 Hypothyroidism, unspecified: Secondary | ICD-10-CM

## 2013-02-21 DIAGNOSIS — E669 Obesity, unspecified: Secondary | ICD-10-CM

## 2013-02-21 MED ORDER — LEVOTHYROXINE SODIUM 175 MCG PO TABS: 175.0000 ug | ORAL_TABLET | Freq: Every day | ORAL | Status: DC

## 2013-02-21 MED ORDER — FLUOXETINE HCL 20 MG PO CAPS: 20.0000 mg | ORAL_CAPSULE | Freq: Every day | ORAL | Status: DC

## 2013-02-21 NOTE — Assessment & Plan Note (Signed)
Recheck TSH and free T3 today.  No changes to her medication but have been refilled.

## 2013-02-21 NOTE — Assessment & Plan Note (Signed)
Look into the bariatric clinic through [facility] surgery.  They have informational meetings on occasion are opened public. Continue lifestyle changes her AVS

## 2013-02-21 NOTE — Progress Notes (Signed)
  [facility]  Patient name: Colleen Peck MRN [id number]  Date of birth: 1981/07/07  CC & HPI:  Colleen Peck is a 32 y.o. female presenting to clinic.  Chief Complaint  Patient presents with  . Hypothyroidism    would like tsh levels checked Not have any palpitations, hair loss.  Does have weight gain   . Anxiety    on prozac, seems be doing well with this.  Denies any SI/HI.  She does have PTSD and is receiving counseling.  Does not want to make changes at this time.    . Obesity Is considering going to a bariatric clinic for B12 injections and additional intervention.   Is not interested in using stimulants or having surgery at this time     ROS:  PER HPI  Pertinent History Reviewed:  Medical & Surgical Hx:  Reviewed: Significant for  obesity, asthma, seasonal allergies,  Medications: Reviewed & Updated - see associated section Social History: Reviewed -  reports that she has never smoked. She has never used smokeless tobacco.  Objective Findings:  Vitals: BP 107/66  Pulse 71  Temp(Src) 99.2 F (37.3 C) (Oral)  Ht 5\' 7"  (1.702 m)  Wt 247 lb (112.038 kg)  BMI 38.68 kg/m2 PE: GENERAL:  adult obese African American  female. In no discomfort; no respiratory distress  PSYCH:  alert and appropriate, good insight   reports overall seems to be doing better now that on Prozac.  Doesn't seem like it is working quite as well as he previously was.  She is interested in having prescribed this medicine for her , no evidence of SI/HI   HNEENT:   Atraumatic, normocephalic  CARDIO:  RRR, S1/S2 heard, no murmur  LUNGS:  CTA B, no wheezes, no crackles  ABDOMEN:   Obese, soft nontender  EXTREM:  warm well perfused, symmetric with no swelling,   GU:   SKIN:   NEUROMSK:     Assessment & Plan:   1. Depression with anxiety   2. Hypothyroidism (acquired)   3. Obesity, unspecified    See problem associated chartingWe'll

## 2013-02-21 NOTE — Assessment & Plan Note (Signed)
Given refilled her Prozac, increased to 2 tablets (40 mg) daily

## 2013-02-21 NOTE — Patient Instructions (Signed)
It was nice to see you today, thanks for coming in!  Problem List Items Addressed This Visit     Present on Admission   Depression with anxiety - Primary     Given refilled her Prozac, increased to 2 tablets (40 mg) daily    Relevant Medications      FLUoxetine (PROZAC) capsule     Other   OBESITY, NOS     Look into the bariatric clinic through [facility] surgery.  They have informational meetings on occasion are opened public. Continue lifestyle changes her AVS    Hypothyroidism (acquired)     Recheck TSH and free T3 today.  No changes to her medication but have been refilled.    Relevant Medications      levothyroxine (SYNTHROID, LEVOTHROID) tablet   Other Relevant Orders      T3, free      TSH     Here are some basic Activity rules to remember: Look for exercise opportunities in your day:  Never lie down when you can sit; never sit when you can stand; never stand when you can pace.  Moving your body throughout the day is just as important as the 30 or 60 minutes of exercise at the gym!  Here are some basic nutrition rules to remember:  "Eat Real Foods & Drink Real Drinks" - if you think it was made in a factory . . it is likely best to avoid it as a staple in your diet.  Limiting these types of foods to 1-2 times per week is a good idea.  Sticking with fresh fruits and vegetables as well as home cooked meals will typically provide more nutrition and less salt than prepackaged meals.     Limit the amount of sugar sweetened and artificially sweetened foods and beverages.  Sticking with water flavored with a slice of lemon, lime or orange is a great option if you want something with flavor in it.  Using flavored seltzer water to flavor plain water will also add some bite if you want something more than flavor.       Eat at least 3 meals and 1-2 snacks per day.  Aim for no more than 5 hours between eating.   Here are 2 of my favorite web sites that provide great nutrition  and exercise advice.   [email or web address] [email or web address].be   Please plan to return to see me in 6 weeks to check your TSH.  If you need anything prior to your next visit please call the clinic.  Please Bring all medications or accurate medication list with you to each appointment; an accurate medication list is essential in providing you the best care possible.

## 2013-02-22 MED ORDER — LEVOTHYROXINE SODIUM 200 MCG PO TABS: 200.0000 ug | ORAL_TABLET | Freq: Every day | ORAL | Status: DC

## 2013-02-22 NOTE — Addendum Note (Signed)
Addended by: Gaspar Bidding D on: 02/22/2013 07:22 PM   Modules accepted: Orders

## 2013-02-23 ENCOUNTER — Telehealth: Payer: Self-pay | Admitting: *Deleted

## 2013-02-23 NOTE — Telephone Encounter (Signed)
Message copied by Tanna Savoy on Fri Feb 23, 2013  1:40 PM ------      Message from: Gaspar Bidding D      Created: Thu Feb 22, 2013  7:22 PM       Still slightly elevated TSH with low free T3.      Sent and prescription for 200 mcg daily Levophed her on and.  She was previously on 175.      Needs to have recheck in 6 weeks.  Please call and inform the patient. ------

## 2013-02-23 NOTE — Telephone Encounter (Signed)
Related message,pt voiced understanding. Amyla Heffner S  

## 2013-03-03 ENCOUNTER — Emergency Department (HOSPITAL_BASED_OUTPATIENT_CLINIC_OR_DEPARTMENT_OTHER)
Admission: EM | Admit: 2013-03-03 | Discharge: 2013-03-03 | Disposition: A | Discharge status: Home / Self Care | Payer: BC Managed Care – PPO | Attending: Emergency Medicine | Admitting: Emergency Medicine

## 2013-03-03 ENCOUNTER — Encounter (HOSPITAL_BASED_OUTPATIENT_CLINIC_OR_DEPARTMENT_OTHER): Payer: Self-pay

## 2013-03-03 DIAGNOSIS — E039 Hypothyroidism, unspecified: Secondary | ICD-10-CM | POA: Insufficient documentation

## 2013-03-03 DIAGNOSIS — R52 Pain, unspecified: Secondary | ICD-10-CM | POA: Insufficient documentation

## 2013-03-03 DIAGNOSIS — Z3202 Encounter for pregnancy test, result negative: Secondary | ICD-10-CM | POA: Insufficient documentation

## 2013-03-03 DIAGNOSIS — F329 Major depressive disorder, single episode, unspecified: Secondary | ICD-10-CM | POA: Insufficient documentation

## 2013-03-03 DIAGNOSIS — R5381 Other malaise: Secondary | ICD-10-CM | POA: Insufficient documentation

## 2013-03-03 DIAGNOSIS — B9789 Other viral agents as the cause of diseases classified elsewhere: Secondary | ICD-10-CM | POA: Insufficient documentation

## 2013-03-03 DIAGNOSIS — B349 Viral infection, unspecified: Secondary | ICD-10-CM

## 2013-03-03 DIAGNOSIS — J45909 Unspecified asthma, uncomplicated: Secondary | ICD-10-CM | POA: Insufficient documentation

## 2013-03-03 DIAGNOSIS — F3289 Other specified depressive episodes: Secondary | ICD-10-CM | POA: Insufficient documentation

## 2013-03-03 DIAGNOSIS — IMO0001 Reserved for inherently not codable concepts without codable children: Secondary | ICD-10-CM | POA: Insufficient documentation

## 2013-03-03 DIAGNOSIS — R509 Fever, unspecified: Secondary | ICD-10-CM

## 2013-03-03 DIAGNOSIS — Z79899 Other long term (current) drug therapy: Secondary | ICD-10-CM | POA: Insufficient documentation

## 2013-03-03 DIAGNOSIS — Z8659 Personal history of other mental and behavioral disorders: Secondary | ICD-10-CM | POA: Insufficient documentation

## 2013-03-03 DIAGNOSIS — R21 Rash and other nonspecific skin eruption: Secondary | ICD-10-CM | POA: Insufficient documentation

## 2013-03-03 LAB — CBC WITH DIFFERENTIAL/PLATELET
Basophils Relative: 0 % (ref 0–1)
Eosinophils Absolute: 0.1 10*3/uL (ref 0.0–0.7)
Eosinophils Relative: 1 % (ref 0–5)
Hemoglobin: 10 g/dL — ABNORMAL LOW (ref 12.0–15.0)
MCH: 26.8 pg (ref 26.0–34.0)
MCHC: 31.7 g/dL (ref 30.0–36.0)
MCV: 84.5 fL (ref 78.0–100.0)
Monocytes Relative: 7 % (ref 3–12)
Neutrophils Relative %: 69 % (ref 43–77)

## 2013-03-03 LAB — URINALYSIS, ROUTINE W REFLEX MICROSCOPIC
Bilirubin Urine: NEGATIVE
Hgb urine dipstick: NEGATIVE
Ketones, ur: NEGATIVE mg/dL
Nitrite: NEGATIVE
Protein, ur: NEGATIVE mg/dL
Specific Gravity, Urine: 1.01 (ref 1.005–1.030)
Urobilinogen, UA: 1 mg/dL (ref 0.0–1.0)

## 2013-03-03 LAB — BASIC METABOLIC PANEL
BUN: 5 mg/dL — ABNORMAL LOW (ref 6–23)
Calcium: 9.7 mg/dL (ref 8.4–10.5)
Creatinine, Ser: 1.1 mg/dL (ref 0.50–1.10)
GFR calc Af Amer: 77 mL/min — ABNORMAL LOW (ref >90)
GFR calc non Af Amer: 67 mL/min — ABNORMAL LOW (ref >90)
Potassium: 3.9 mEq/L (ref 3.5–5.1)

## 2013-03-03 LAB — PREGNANCY, URINE: Preg Test, Ur: NEGATIVE

## 2013-03-03 MED ORDER — ACETAMINOPHEN 325 MG PO TABS
650.0000 mg | ORAL_TABLET | Freq: Once | ORAL | Status: AC
  Administered 2013-03-03: 650 mg via ORAL

## 2013-03-03 MED ORDER — ACETAMINOPHEN 325 MG PO TABS
ORAL_TABLET | ORAL | Status: AC
  Filled 2013-03-03: qty 2

## 2013-03-03 NOTE — ED Notes (Signed)
Patient reports that she developed fatique on Wednesday, noticed bumps on forehead and scalp. Reports chills and fever on Thursday. Denies cold symptoms.

## 2013-03-03 NOTE — ED Notes (Signed)
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 Patient in distress, tylenol administered at triage

## 2013-03-03 NOTE — ED Provider Notes (Signed)
CSN: [id number]     Arrival date & time 03/03/13  1541 History    This chart was scribed for Lyanne Co, MD,  by Ashley Jacobs, ED Scribe. The patient was seen in room MH09/MH09 and the patient's care was started at 7:17 PM.   First MD Initiated Contact with Patient 03/03/13 1838     Chief Complaint  Patient presents with  . Fever   (Consider location/radiation/quality/duration/timing/severity/associated sxs/prior Treatment) Patient is a 32 y.o. female presenting with fever. The history is provided by the patient and medical records. No language interpreter was used.  Fever Max temp prior to arrival:  103 Severity:  Mild Onset quality:  Gradual Timing:  Constant Progression:  Worsening Chronicity:  New Relieved by:  Nothing Associated symptoms: chills, myalgias and rash   Associated symptoms: no cough, no diarrhea, no nausea, no rhinorrhea, no sore throat and no vomiting   Risk factors: recent travel and sick contacts    HPI Comments: Colleen Peck is a 32 y.o. female who presents to the Emergency Department complaining of fever that presented 3 days ago with max temperature of 103 and symptoms worsening the morning of arrival. Pt mentions 2 days PTA while traveling to visit her father she began to feel fatigued and tired. She reports experiencing generalized aches, chills and decreased appetite.  The morning of arrival she noticed papular rash across her forehead and states that it is painful.  Pt denies cough, rhinorrhea and sore throat. She reports taking tylenol before arrival. She mentions the symptoms have improved but not resolved.  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 Asthma   . Seasonal allergies   . Depression   . PTSD (post-traumatic stress disorder)   . Thyroid disease 2009    Graves disease (pt reported resolved); hypothyriodism  . Abnormal pap     pt  reports abnl pap many years ago.  Nl since then.   Past Surgical History  Procedure Laterality Date  . Dilation and curettage of uterus  March 2006   Family History  Problem Relation Age of Onset  . Drug abuse Father   . Depression Maternal Aunt   . Depression Maternal Grandmother   . Anxiety disorder Maternal Grandmother   . COPD Maternal Grandmother   . Suicidality Cousin   . Depression Cousin   . Depression Maternal Aunt   . Hypertension Mother   . Diabetes Paternal Grandfather   . COPD Paternal Grandmother   . Heart disease Neg Hx    History  Substance Use Topics  . Smoking status: Never Smoker   . Smokeless tobacco: Never Used  . Alcohol Use: No     Comment: past use of alcohol in '08-'09   OB History   Grav Para Term Preterm Abortions TAB SAB Ect Mult Living                 Review of Systems  Constitutional: Positive for fever, chills and appetite change.  HENT: Negative for sore throat and rhinorrhea.   Respiratory: Negative for cough.   Gastrointestinal: Negative for nausea, vomiting and diarrhea.  Musculoskeletal: Positive for myalgias.  Skin: Positive for rash.  All other systems reviewed and are negative.    Allergies  Review of patient's allergies indicates no active allergies.  Home Medications   Current Outpatient Rx  Name  Route  Sig  Dispense  Refill  . albuterol (PROVENTIL HFA) 108 (90 BASE) MCG/ACT inhaler   Inhalation   Inhale 2 puffs into the lungs every 6 (six) hours as needed. For shortness of breath, wheezing.   1 Inhaler   6   . albuterol (PROVENTIL) (2.5 MG/3ML) 0.083% nebulizer solution   Nebulization   Take 3 mLs (2.5 mg total) by nebulization every 6 (six) hours as needed for shortness of breath. For shortness of breath   25 mL   0   . FLUoxetine (PROZAC) 20 MG capsule   Oral   Take 40 mg by mouth daily.         . fluticasone (FLONASE) 50 MCG/ACT nasal spray   Nasal   Place 2 sprays into the nose daily.   16 g   6    . levothyroxine (SYNTHROID, LEVOTHROID) 200 MCG tablet   Oral   Take 1 tablet (200 mcg total) by mouth daily.   30 tablet   1   . traZODone (DESYREL) 50 MG tablet   Oral   Take 0.5-1 tablets (25-50 mg total) by mouth at bedtime as needed for sleep.   30 tablet   0    BP 125/66  Pulse 88  Temp(Src) 101.5 F (38.6 C) (Oral)  Resp 20  Ht 5\' 7"  (1.702 m)  Wt 245 lb (111.131 kg)  BMI 38.36 kg/m2  SpO2 98%  LMP 02/19/2013 Physical Exam  Nursing note and vitals reviewed. Constitutional: She is oriented to person, place, and time. She appears well-developed and well-nourished. No distress.  HENT:  Head: Normocephalic and atraumatic.  Right Ear: External ear normal.  Left Ear: External ear normal.  Bilateral TM are normal No meningeal sign   Eyes: EOM are normal. Pupils are equal, round, and reactive to light. Right eye exhibits no discharge.  Neck: Normal range of motion.  No lymphadenopathy    Cardiovascular: Normal rate, regular rhythm and normal heart sounds.   Pulmonary/Chest: Effort normal and breath sounds normal.  Abdominal: Soft. She exhibits no distension. There is no tenderness.  Musculoskeletal: Normal range of motion.  Neurological: She is alert and oriented to person, place, and time.  Skin: Skin is warm and dry. Rash noted.  Psychiatric: She has a normal mood and affect. Judgment normal.    ED Course  DIAGNOSTIC STUDIES: Oxygen Saturation is 98% on room air, normal by my interpretation.    COORDINATION OF CARE: 7:19 PM Discussed course of care with pt which includes Ibuprofen and Tylenol . Pt understands and agrees.    Procedures (including critical care time)  Labs Reviewed  CBC WITH DIFFERENTIAL - Abnormal; Notable for the following:    RBC 3.73 (*)    Hemoglobin 10.0 (*)    HCT 31.5 (*)    All other components within normal limits  BASIC METABOLIC PANEL - Abnormal; Notable for the following:    Sodium 134 (*)    BUN 5 (*)    GFR calc non Af  Amer 67 (*)    GFR calc Af Amer 77 (*)    All other components within normal limits  URINE CULTURE  URINALYSIS, ROUTINE W REFLEX MICROSCOPIC  PREGNANCY, URINE   No results found. 1. Fever   2. Viral syndrome     MDM  Likely viral process.  Overall well-appearing.  She feels much better after Tylenol.  Nontoxic.  No meningeal signs.  Doubt bacteremia.  No cough or congestion to suggest pneumonia.  No abdominal complaints.  Abdominal exam  benign.  Discharge home in good condition.  Likely viral illness.   I personally performed the services described in this documentation, which was scribed in my presence. The recorded information has been reviewed and is accurate.       Lyanne Co, MD 03/03/13 [id number]

## 2013-03-05 ENCOUNTER — Encounter: Payer: Self-pay | Admitting: Family Medicine

## 2013-03-05 ENCOUNTER — Ambulatory Visit (INDEPENDENT_AMBULATORY_CARE_PROVIDER_SITE_OTHER): Payer: BC Managed Care – PPO | Admitting: Family Medicine

## 2013-03-05 VITALS — BP 123/71 | HR 93 | Temp 100.3°F | Ht 67.0 in | Wt 241.1 lb

## 2013-03-05 DIAGNOSIS — R21 Rash and other nonspecific skin eruption: Secondary | ICD-10-CM

## 2013-03-05 LAB — URINE CULTURE

## 2013-03-05 MED ORDER — DOXYCYCLINE HYCLATE 100 MG PO TABS: 100.0000 mg | ORAL_TABLET | Freq: Two times a day (BID) | ORAL | Status: DC

## 2013-03-05 NOTE — Patient Instructions (Signed)
Thank you for coming in, today! I think you may have a virus like the ED doctor told you. This could explain your fever and rash. You may have minor infections on the spots of the rash, on top of a virus. This could also be [facility] Fever, though this less likely. You can keep taking Tylenol as needed, 650 mg every 4 hours. I will write you a prescription for doxycycline (an antibiotic) to take for 2 weeks. I will you a letter to stay out of work until you go at least 24 hours without a fever. Come back to see Dr. Berline Chough in 1 week if you're not feeling better. Otherwise, come back as you need. Please feel free to call with any questions or concerns at any time, at [phone]. --Dr. Casper Harrison  [facility] Fever [facility] Spotted Fever (RMSF) is the oldest known tick-borne disease of people in the [place]. This disease was named because it was first described among people in the [place] area who had an illness characterized by a rash with red-purple-black spots. This disease is caused by a rickettsia (Rickettsia rickettsii), a bacteria carried by the tick. The [facility] wood tick and the American dog tick, acquire and transmit the RMSF bacteria (pictures NOT actual size). When a larval, nymphal or adult tick feeds on an infected rodent or larger animal, the tick can become infected. Infected adult ticks then feed on people who may then get RMSF. The tick transmits the disease to humans during a prolonged period of feeding that lasts many hours, days or even a couple weeks. The bite is painless and frequently goes unnoticed. An infected female tick may also pass the rickettsial bacteria to her eggs that then may mature to be infected adult ticks. The rickettsia that causes RMSF can also get into a person's body through damaged skin. A tick bite is not necessary. People can get RMSF if they crush a tick and get it's blood or body fluids on their skin through a  small cut or sore.  DIAGNOSIS Diagnosis is made by laboratory tests.  TREATMENT Treatment is with antibiotics (medications that kill rickettsia and other bacteria). Immediate treatment usually prevents death. GEOGRAPHIC RANGE This disease was reported only in the [facility] until 1931. RMSF has more recently been described among individuals in all states except [place], [place] and [place]. The highest reported incidences of RMSF now occur among residents of [place], [place], [place] and [address]. TIME OF YEAR  Most cases are diagnosed during late spring and summer when ticks are most active. However, especially in the warmer [place] states, a few cases occur during the winter. SYMPTOMS   Symptoms of RMSF begin from 2 to 14 days after a tick bite. The most common early symptoms are fever, muscle aches and headache followed by nausea (feeling sick to your stomach) or vomiting.  The RMSF rash is typically delayed until 3 or more days after symptom onset, and eventually develops in 9 of 10 infected patients by the 5th day of illness. If the disease is not treated it can cause death. If you get a fever, headache, muscle aches, rash, nausea or vomiting within 2 weeks of a possible tick bite or exposure you should see your caregiver immediately. PREVENTION Ticks prefer to hide in shady, moist ground litter. They can often be found above the ground clinging to tall grass, brush, shrubs and low tree branches. They also inhabit lawns and gardens, especially at the  edges of woodlands and around old stone walls. Within the areas where ticks generally live, no naturally vegetated area can be considered completely free of infected ticks. The best precaution against RMSF is to avoid contact with soil, leaf litter and vegetation as much as possible in tick infested areas. For those who enjoy gardening or walking in their yards, clear brush and mow tall grass around houses and at the edges of gardens.  This may help reduce the tick population in the immediate area. Applications of chemical insecticides by a licensed professional in the spring (late May) and Fall (September) will also control ticks, especially in heavily infested areas. Treatment will never get rid of all the ticks. Getting rid of small animal populations that host ticks will also decrease the tick population. When working in the garden, [employer], or handling soil and vegetation, wear light-colored protective clothing and gloves. Spot-check often to prevent ticks from reaching the skin. Ticks cannot jump or fly. They will not drop from an above-ground perch onto a passing animal. Once a tick gains access to human skin it climbs upward until it reaches a more protected area. For example, the back of the knee, groin, navel, armpit, ears or nape of the neck. It then begins the slow process of embedding itself in the skin. Campers, hikers, field workers, and others who spend time in wooded, brushy or tall grassy areas can avoid exposure to ticks by using the following precautions:  Wear light-colored clothing with a tight weave to spot ticks more easily and prevent contact with the skin.  Wear long pants tucked into socks, long-sleeved shirts tucked into pants and enclosed shoes or boots along with insect repellent.  Spray clothes with insect repellent containing either DEET or Permethrin. Only DEET can be used on exposed skin. Follow the manufacturer's directions carefully.  Wear a hat and keep long hair pulled back.  Stay on cleared, well-worn trails whenever possible.  Spot-check yourself and others often for the presence of ticks on clothes. If you find one, there are likely to be others. Check thoroughly.  Remove clothes after leaving tick-infested areas. If possible, wash them to eliminate any unseen ticks. Check yourself, your children and any pets from head to toe for the presence of ticks.  Shower and shampoo. You can  greatly reduce your chances of contracting RMSF if you remove attached ticks as soon as possible. Regular checks of the body, including all body sites covered by hair (head, armpits, genitals), allow removal of the tick before rickettsial transmission. To remove an attached tick, use a forceps or tweezers to detach the intact tick without leaving mouth parts in the skin. The tick bite wound should be cleansed after tick removal. Remember the most common symptoms of RMSF are fever, muscle aches, headache and nausea or vomiting with a later onset of rash. If you get these symptoms after a tick bite and while living in an area where RMSF is found, RMSF should be suspected. If the disease is not treated, it can cause death. See your caregiver immediately if you get these symptoms. Do this even if not aware of a tick bite. Document Released: 10/10/2000 Document Revised: 09/20/2011 Document Reviewed: 06/02/2009 [facility] Patient Information 2014 [place], [place].

## 2013-03-07 NOTE — Progress Notes (Signed)
  Subjective:    Patient ID: Colleen Peck, female    DOB: 08-May-1981, 32 y.o.   MRN: [id number]  HPI: Pt presents to clinic with complaint of fever and rash for about six days. Fever up to around 100, highest 101 (98.9 earlier today). Pt also complains of poor sleep, poor appetite, headache, and generalized weakness/body aches. Pt was seen in the ED and was told she has a virus. Pt complains of rash that started on her face, first appeared similar to small boils (she thought around her hair follicles), spread across her face, and now has spread down onto her chest and shoulders. Pt also has a few bruises on her LE's that she is unsure how they got there; she does not remember hitting anything. Pt has been taking Tylenol and ibuprofen alternating for the fever. She denies sore throat, N/V/D.Marland Kitchen She denies known tick bite but she states her home [place] "[place]," and she has been dog-sitting a [place]. 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  Review of Systems: As above. Vague constellation of symptoms.     Objective:   Physical Exam BP 123/71  Pulse 93  Temp(Src) 100.3 F (37.9 C) (Oral)  Ht 5\' 7"  (1.702 m)  Wt 241 lb 1.6 oz (109.362 kg)  BMI 37.75 kg/m2  LMP 02/19/2013  Gen: non-toxic-appearing adult female in NAD, though does have some anxious affect HEENT: mild, nondiscript papular rash across forehead, similar in appearance to mild acne  PERRLA, EOMI, sclerae and conjunctivae clear, TM's normal, MMM Cardio: RRR, no murmur Pulm: CTAB, no wheezes Abd: soft, nontender, BS+ MSK: mild diffuse muscle tenderness, full ROM to all joints, no frank joint effusions; mild arthritic pain with active ROM to  hands Neuro: non-focal exam without gross deficit, strength 5/5 in all extremities Skin: facial rash as above; isolated few blister- or boil-like lesions to shoulder/upper trunk, without surrounding erythema; no clusters of lesions  Faint healing bruises to right LE, anterior thigh; no target lesions, no other rashes appreciated     Assessment & Plan:

## 2013-03-07 NOTE — Assessment & Plan Note (Signed)
A: Very likely nonspecific viral illness, now present for about 6 days, with vague constellation of complaints. Unlikely but possible atypical presentation of RMSF, given rash, headache, joint/muscle aches, fever, and exposure to dog in wooded area.  P: Rx for doxycycline to cover RMSF. Strict return precautions discussed. F/u with PCP Dr. Berline Chough in 1 week if no improvement, or sooner if symptoms progress or worsen. 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

## 2013-05-15 ENCOUNTER — Telehealth: Payer: Self-pay | Admitting: Sports Medicine

## 2013-05-15 DIAGNOSIS — E039 Hypothyroidism, unspecified: Secondary | ICD-10-CM

## 2013-05-15 MED ORDER — LEVOTHYROXINE SODIUM 200 MCG PO TABS: 200.0000 ug | ORAL_TABLET | Freq: Every day | ORAL | Status: DC

## 2013-05-15 NOTE — Telephone Encounter (Signed)
Thyroid med refilled via Epic for 1 time only.  Will need to keep appt for additional refills.  Gaylene Brooks, RN

## 2013-05-15 NOTE — Telephone Encounter (Signed)
Pt scheduled for an appt on 11/11 @ 4:00.  Need refill on her thyroid medication.  Aware must keep appt for further refills.  Send to [facility] on [address].

## 2013-05-22 ENCOUNTER — Encounter: Payer: Self-pay | Admitting: Sports Medicine

## 2013-05-22 ENCOUNTER — Ambulatory Visit (INDEPENDENT_AMBULATORY_CARE_PROVIDER_SITE_OTHER): Payer: BC Managed Care – PPO | Admitting: Sports Medicine

## 2013-05-22 VITALS — BP 119/65 | HR 82 | Temp 97.8°F | Wt 244.0 lb

## 2013-05-22 DIAGNOSIS — L2089 Other atopic dermatitis: Secondary | ICD-10-CM

## 2013-05-22 DIAGNOSIS — E039 Hypothyroidism, unspecified: Secondary | ICD-10-CM

## 2013-05-22 DIAGNOSIS — R002 Palpitations: Secondary | ICD-10-CM

## 2013-05-22 DIAGNOSIS — E559 Vitamin D deficiency, unspecified: Secondary | ICD-10-CM

## 2013-05-22 DIAGNOSIS — Z299 Encounter for prophylactic measures, unspecified: Secondary | ICD-10-CM

## 2013-05-22 DIAGNOSIS — J45909 Unspecified asthma, uncomplicated: Secondary | ICD-10-CM

## 2013-05-22 DIAGNOSIS — R252 Cramp and spasm: Secondary | ICD-10-CM | POA: Insufficient documentation

## 2013-05-22 DIAGNOSIS — T783XXA Angioneurotic edema, initial encounter: Secondary | ICD-10-CM

## 2013-05-22 DIAGNOSIS — J309 Allergic rhinitis, unspecified: Secondary | ICD-10-CM

## 2013-05-22 MED ORDER — FLUTICASONE PROPIONATE 50 MCG/ACT NA SUSP: 2.0000 | Freq: Every day | NASAL | Status: DC

## 2013-05-22 NOTE — Progress Notes (Signed)
[facility] Colleen Peck - 32 y.o. female MRN [id number]  Date of birth: 1980-10-19  CC, HPI, Interval History & ROS  Colleen Peck is here today to followup on her chronic medical conditions including:  Depression, posttraumatic stress disorder, hypothyroidism status post radioablation for Graves' disease.  She reports overall doing fairly well.  She has started exercising.  She is been experiencing some muscle cramps as below.  Pt denies chest pain, dyspnea at rest or exertion, PND, lower extremity edema.  Patient denies any facial asymmetry, unilateral weakness, or dysarthria.  Denies any thinning of her hair or tachypalpitations.  She does report significant muscle spasms in her right calf especially after hard workout.  These occur mainly at night. She does associate these with a prior vitamin D deficiency and like this checked today. Charles reports hoarseness and feeling of congestion.  She has been coughing.  She has not been using Flonase.  Pertinent History & Care Coordination  No Patient Care Coordination Note on file.  History  Smoking status  . Never Smoker   Smokeless tobacco  . Never Used   No health maintenance topics applied.  Recent Labs  09/27/12 1609 11/23/12 1528 02/21/13 1023  TSH 2.406 15.222* 8.605*     Otherwise past Medical, Surgical, Social, and Family History Reviewed per EMR Medications and Allergies reviewed and all updated if necessary. Objective Findings  VITALS: HR: 82 bpm  BP: 119/65 mmHg  TEMP: 97.8 F (36.6 C) (Oral)  RESP:    HT:    WT: 244 lb (110.678 kg)  BMI:     BP Readings from Last 3 Encounters:  05/22/13 119/65  03/05/13 123/71  03/03/13 125/66   Wt Readings from Last 3 Encounters:  05/22/13 244 lb (110.678 kg)  03/05/13 241 lb 1.6 oz (109.362 kg)  03/03/13 245 lb (111.131 kg)     PHYSICAL EXAM: GENERAL:  obese African American female. In no discomfort; no respiratory distress  PSYCH: alert and  appropriate, good insight   HNEENT: H&N: AT/Sandy, trachea midline, no thyromegaly   Eyes: no scleral icterus, no conjunctival exudate  Ears:  bilateral tympanic membranes clear, no middle ear effusion   Nose:  significant nasal exudate and ear edema   Oropharynx: MMM, no posterior or pharyngeal erythema or tonsillar hypertrophy   Dentention:      CARDIO: RRR, S1/S2 heard, no murmur  LUNGS: CTA B, no wheezes, no crackles  ABDOMEN:   EXTREM:  Warm, well perfused.  Moves all 4 extremities spontaneously; no lateralization.  Distal pulses 2+/4.  No pretibial edema or unilateral swelling   GU:   SKIN:     Assessment & Plan   Problems addressed today: General Plan & Pt Instructions:  1. Hypothyroidism (acquired)   2. Muscle cramp   3. Unspecified vitamin D deficiency   4. RHINITIS, ALLERGIC   5. ANGIOEDEMA   6. ASTHMA, INTERMITTENT   7. ECZEMA, ATOPIC DERMATITIS   8. Palpitations   9. Preventive measure       Come back 1st thing for Labs  Tetanus today  Be sure to eat a small amount immediately after exercise (small amount of chocolate milk (8oz)  Follow up in 4-6 months  Flonase daily      For further discussion of A/P and for follow up issues see problem based charting.

## 2013-05-22 NOTE — Assessment & Plan Note (Signed)
TSH, T3 today

## 2013-05-22 NOTE — Patient Instructions (Signed)
   Come back 1st thing for Labs  Tetanus today  Be sure to eat a small amount immediately after exercise (small amount of chocolate milk (8oz)  Follow up in 4-6 months  Flonase daily    If you need anything prior to your next visit please call the clinic. Please Bring all medications or accurate medication list with you to each appointment; an accurate medication list is essential in providing you the best care possible.

## 2013-05-22 NOTE — Assessment & Plan Note (Signed)
Restart Flonase - appropriate use discussed Saline Rinses discussed

## 2013-05-22 NOTE — Assessment & Plan Note (Addendum)
Unclear etiology, see AVS for other recommendations  Cbc, cmet, vit d

## 2013-05-24 ENCOUNTER — Encounter: Payer: Self-pay | Admitting: Sports Medicine

## 2013-05-24 ENCOUNTER — Other Ambulatory Visit: Payer: BC Managed Care – PPO

## 2013-05-24 DIAGNOSIS — E039 Hypothyroidism, unspecified: Secondary | ICD-10-CM

## 2013-05-24 DIAGNOSIS — R252 Cramp and spasm: Secondary | ICD-10-CM

## 2013-05-24 LAB — COMPREHENSIVE METABOLIC PANEL
ALT: 22 U/L (ref 0–35)
AST: 21 U/L (ref 0–37)
CO2: 28 mEq/L (ref 19–32)
Calcium: 9.6 mg/dL (ref 8.4–10.5)
Chloride: 103 mEq/L (ref 96–112)
Creat: 0.94 mg/dL (ref 0.50–1.10)
Sodium: 137 mEq/L (ref 135–145)
Total Protein: 6.7 g/dL (ref 6.0–8.3)

## 2013-05-24 LAB — CBC
Platelets: 320 10*3/uL (ref 150–400)
RBC: 3.76 MIL/uL — ABNORMAL LOW (ref 3.87–5.11)
RDW: 14.7 % (ref 11.5–15.5)
WBC: 6.6 10*3/uL (ref 4.0–10.5)

## 2013-05-24 NOTE — Assessment & Plan Note (Signed)
Tetanus today. Pap smear due at next visit

## 2013-05-24 NOTE — Assessment & Plan Note (Signed)
Recheck vitamin D as this has been low in the past

## 2013-05-24 NOTE — Progress Notes (Signed)
CMP, CBC, TSH, VITD, FT3, MG DONE TODAY. Colleen Peck,

## 2013-05-25 LAB — VITAMIN D 25 HYDROXY (VIT D DEFICIENCY, FRACTURES): Vit D, 25-Hydroxy: 27 ng/mL — ABNORMAL LOW (ref 30–89)

## 2013-05-25 LAB — T3, FREE: T3, Free: 2.3 pg/mL (ref 2.3–4.2)

## 2013-06-11 ENCOUNTER — Encounter: Payer: Self-pay | Admitting: Sports Medicine

## 2013-06-12 ENCOUNTER — Other Ambulatory Visit: Payer: Self-pay | Admitting: Sports Medicine

## 2013-06-12 DIAGNOSIS — E039 Hypothyroidism, unspecified: Secondary | ICD-10-CM

## 2013-06-12 MED ORDER — FLUOXETINE HCL 40 MG PO CAPS: 40.0000 mg | ORAL_CAPSULE | Freq: Every day | ORAL | Status: DC

## 2013-06-12 MED ORDER — LEVOTHYROXINE SODIUM 200 MCG PO TABS: 200.0000 ug | ORAL_TABLET | Freq: Every day | ORAL | Status: DC

## 2013-06-18 ENCOUNTER — Other Ambulatory Visit: Payer: Self-pay | Admitting: Sports Medicine

## 2013-06-18 ENCOUNTER — Encounter: Payer: Self-pay | Admitting: Sports Medicine

## 2013-06-18 DIAGNOSIS — E039 Hypothyroidism, unspecified: Secondary | ICD-10-CM

## 2013-06-18 MED ORDER — VITAMIN D3 1.25 MG (50000 UT) PO CAPS: 1.0000 | ORAL_CAPSULE | ORAL | Status: DC

## 2013-06-18 MED ORDER — LEVOTHYROXINE SODIUM 112 MCG PO TABS: 224.0000 ug | ORAL_TABLET | Freq: Every day | ORAL | Status: DC

## 2013-08-03 ENCOUNTER — Other Ambulatory Visit: Payer: Self-pay | Admitting: Sports Medicine

## 2013-08-06 ENCOUNTER — Other Ambulatory Visit: Payer: Self-pay | Admitting: Sports Medicine

## 2013-08-06 MED ORDER — FLUOXETINE HCL 40 MG PO CAPS: 40.0000 mg | ORAL_CAPSULE | Freq: Every day | ORAL | Status: DC

## 2013-08-15 ENCOUNTER — Encounter: Payer: Self-pay | Admitting: Family Medicine

## 2013-08-15 ENCOUNTER — Ambulatory Visit (INDEPENDENT_AMBULATORY_CARE_PROVIDER_SITE_OTHER): Payer: 59 | Admitting: Family Medicine

## 2013-08-15 VITALS — BP 109/58 | HR 68 | Temp 98.9°F

## 2013-08-15 DIAGNOSIS — F341 Dysthymic disorder: Secondary | ICD-10-CM

## 2013-08-15 DIAGNOSIS — E039 Hypothyroidism, unspecified: Secondary | ICD-10-CM

## 2013-08-15 DIAGNOSIS — F418 Other specified anxiety disorders: Secondary | ICD-10-CM

## 2013-08-15 NOTE — Patient Instructions (Signed)
I think your thyroid dose is too high (hyperthyroid) and that typically can cause anxiety. Let's find out for sure. I will call tomorrow with your TSH and we can adjust your dose.

## 2013-08-15 NOTE — Progress Notes (Signed)
   Subjective:    Patient ID: Colleen Peck, female    DOB: [birth year], 33 y.o.   MRN: [id number]  HPI Call results to mobile phone number [phone]. Patient complains of significant anxiety - very bad for 3 days.  Wonders if it could be her thyroid.  Looking at thyroid dosing, she is on a high dose and it was increased in December despite normal TSH in November.     Review of Systems     Objective:   Physical Exam Oriented x 3 with normal affect and no anxiety in the office. Not tachycardic No tremor.       Assessment & Plan:

## 2013-08-16 LAB — TSH: TSH: 1.535 u[IU]/mL (ref 0.350–4.500)

## 2013-08-16 MED ORDER — TRAZODONE HCL 50 MG PO TABS: 25.0000 mg | ORAL_TABLET | Freq: Every evening | ORAL | Status: DC | PRN

## 2013-08-16 NOTE — Assessment & Plan Note (Signed)
Check TSH to look for iatrogenic hyperthyroid. Note: TSH came back normal.  Patient notified and will stay on same dose.

## 2013-08-16 NOTE — Assessment & Plan Note (Signed)
Seems to have a long history of emotional problems.  Given normal TSH, I suspect her issues are primarily psychiatric rather than thyroid related.  In my phone call to give TSH results, I encouraged her to FU with her PCP and refilled her trazodone which did help with her insomnia.

## 2013-08-17 ENCOUNTER — Other Ambulatory Visit: Payer: Self-pay | Admitting: *Deleted

## 2013-08-20 MED ORDER — FLUOXETINE HCL 40 MG PO CAPS: 40.0000 mg | ORAL_CAPSULE | Freq: Every day | ORAL | Status: DC

## 2013-08-27 ENCOUNTER — Ambulatory Visit: Payer: Self-pay | Admitting: Sports Medicine

## 2013-09-12 ENCOUNTER — Encounter (HOSPITAL_COMMUNITY): Payer: Self-pay | Admitting: Emergency Medicine

## 2013-09-12 DIAGNOSIS — IMO0002 Reserved for concepts with insufficient information to code with codable children: Secondary | ICD-10-CM | POA: Insufficient documentation

## 2013-09-12 DIAGNOSIS — E079 Disorder of thyroid, unspecified: Secondary | ICD-10-CM | POA: Insufficient documentation

## 2013-09-12 DIAGNOSIS — M62838 Other muscle spasm: Secondary | ICD-10-CM | POA: Insufficient documentation

## 2013-09-12 DIAGNOSIS — F329 Major depressive disorder, single episode, unspecified: Secondary | ICD-10-CM | POA: Insufficient documentation

## 2013-09-12 DIAGNOSIS — Z79899 Other long term (current) drug therapy: Secondary | ICD-10-CM | POA: Insufficient documentation

## 2013-09-12 DIAGNOSIS — J45909 Unspecified asthma, uncomplicated: Secondary | ICD-10-CM | POA: Insufficient documentation

## 2013-09-12 DIAGNOSIS — F3289 Other specified depressive episodes: Secondary | ICD-10-CM | POA: Insufficient documentation

## 2013-09-12 NOTE — ED Notes (Signed)
Presents with right sided neck pain began Sunday, has progressed to severe pain with inability to turn neck. Not relieved with Ibuprofen. Pain is described as "pinched nerve pain" movement makes pain worse. Reports that lying down at night causes right sided facial numbness. Denies fevers. Alert, oriented.

## 2013-09-13 ENCOUNTER — Ambulatory Visit: Payer: Self-pay

## 2013-09-13 ENCOUNTER — Emergency Department (HOSPITAL_COMMUNITY)
Admission: EM | Admit: 2013-09-13 | Discharge: 2013-09-13 | Disposition: A | Discharge status: Home / Self Care | Payer: 59 | Attending: Emergency Medicine | Admitting: Emergency Medicine

## 2013-09-13 DIAGNOSIS — M62838 Other muscle spasm: Secondary | ICD-10-CM

## 2013-09-13 MED ORDER — OXYCODONE-ACETAMINOPHEN 5-325 MG PO TABS
2.0000 | ORAL_TABLET | Freq: Once | ORAL | Status: AC
  Administered 2013-09-13: 2 via ORAL
  Filled 2013-09-13: qty 2

## 2013-09-13 MED ORDER — METHOCARBAMOL 500 MG PO TABS
750.0000 mg | ORAL_TABLET | Freq: Once | ORAL | Status: AC
  Administered 2013-09-13: 750 mg via ORAL
  Filled 2013-09-13: qty 2

## 2013-09-13 MED ORDER — OXYCODONE-ACETAMINOPHEN 5-325 MG PO TABS: 1.0000 | ORAL_TABLET | ORAL | Status: DC | PRN

## 2013-09-13 MED ORDER — METHOCARBAMOL 500 MG PO TABS: 500.0000 mg | ORAL_TABLET | Freq: Two times a day (BID) | ORAL | Status: DC

## 2013-09-13 NOTE — ED Provider Notes (Signed)
CSN: [id number]     Arrival date & time 09/12/13  2219 History   First MD Initiated Contact with Patient 09/13/13 0215     Chief Complaint  Patient presents with  . Neck Pain     (Consider location/radiation/quality/duration/timing/severity/associated sxs/prior Treatment) Patient is a 33 y.o. female presenting with neck pain. The history is provided by the patient and medical records. No language interpreter was used.  Neck Pain Associated symptoms: no chest pain, no fever, no headaches, no numbness and no weakness     Colleen Peck is a 33 y.o. female  with a hx of asthma, PTSD, thyroid disease presents to the Emergency Department complaining of gradual, persistent, progressively worsening bilateral neck pain and associated spasm right worse than left onset 3 days ago. Patient reports that she has been walking to school carrying heavy bags for several weeks now.  She reports shoulder pain for the last several weeks with increasing neck pain recently. She reports taking ibuprofen 500 mg twice per day for the last several days without relief. She states heat, largely from the shower decrease her pain and spasm but wears off after about 15 minutes and the symptoms return.  She reports tonight when she laid down to go to sleep she felt as if her jaw was only she denies paresthesias, numbness or weakness in her hands or feet. She denies saddle anesthesia, gait disturbance, loss of bowel or bladder control.  She also denies, sinus congestion, rash or fever.  Patient denies headache, fever, chills, chest pain, shortness of breath, abdominal pain, nausea, vomiting, diarrhea, weakness, dizziness, syncope, dysuria, hematuria.  Past Medical History  Diagnosis Date  . Asthma   . Seasonal allergies   . Depression   . PTSD (post-traumatic stress disorder)   . Thyroid disease 2009    Graves disease (pt reported resolved); hypothyriodism  . Abnormal pap     pt reports abnl pap many years ago.  Nl since  then.  . Palpitations 03/12/2008   Past Surgical History  Procedure Laterality Date  . Dilation and curettage of uterus  March 2006   Family History  Problem Relation Age of Onset  . Drug abuse Father   . Depression Maternal Aunt   . Depression Maternal Grandmother   . Anxiety disorder Maternal Grandmother   . COPD Maternal Grandmother   . Suicidality Cousin   . Depression Cousin   . Depression Maternal Aunt   . Hypertension Mother   . Diabetes Paternal Grandfather   . COPD Paternal Grandmother   . Heart disease Neg Hx    History  Substance Use Topics  . Smoking status: Never Smoker   . Smokeless tobacco: Never Used  . Alcohol Use: No     Comment: past use of alcohol in '08-'09   OB History   Grav Para Term Preterm Abortions TAB SAB Ect Mult Living                 Review of Systems  Constitutional: Negative for fever and fatigue.  Eyes: Negative for visual disturbance.  Respiratory: Negative for chest tightness and shortness of breath.   Cardiovascular: Negative for chest pain.  Gastrointestinal: Negative for nausea, vomiting, abdominal pain and diarrhea.  Genitourinary: Negative for dysuria, urgency, frequency and hematuria.  Musculoskeletal: Positive for neck pain. Negative for back pain, gait problem, joint swelling and neck stiffness.  Skin: Negative for rash.  Neurological: Negative for weakness, light-headedness, numbness and headaches.  All other systems reviewed and are negative.  Allergies  Review of patient's allergies indicates no known allergies.  Home Medications   Current Outpatient Rx  Name  Route  Sig  Dispense  Refill  . albuterol (PROVENTIL HFA) 108 (90 BASE) MCG/ACT inhaler   Inhalation   Inhale 2 puffs into the lungs every 6 (six) hours as needed. For shortness of breath, wheezing.   1 Inhaler   6   . albuterol (PROVENTIL) (2.5 MG/3ML) 0.083% nebulizer solution   Nebulization   Take 3 mLs (2.5 mg total) by nebulization every 6  (six) hours as needed for shortness of breath. For shortness of breath   25 mL   0   . FLUoxetine (PROZAC) 40 MG capsule   Oral   Take 1 capsule (40 mg total) by mouth daily.   30 capsule   5   . fluticasone (FLONASE) 50 MCG/ACT nasal spray   Each Nare   Place 2 sprays into both nostrils daily.   16 g   6   . ibuprofen (ADVIL,MOTRIN) 200 MG tablet   Oral   Take 1,000 mg by mouth every 6 (six) hours as needed for mild pain or moderate pain.         . IRON PO   Oral   Take 65 mg by mouth daily.         Marland Kitchen levothyroxine (SYNTHROID, LEVOTHROID) 112 MCG tablet   Oral   Take 2 tablets (224 mcg total) by mouth daily before breakfast.   60 tablet   3   . Multiple Vitamins-Minerals (HM MULTIVITAMIN ADULT GUMMY PO)   Oral   Take 2 capsules by mouth daily.         . methocarbamol (ROBAXIN) 500 MG tablet   Oral   Take 1 tablet (500 mg total) by mouth 2 (two) times daily.   20 tablet   0   . oxyCODONE-acetaminophen (PERCOCET/ROXICET) 5-325 MG per tablet   Oral   Take 1-2 tablets by mouth every 4 (four) hours as needed for severe pain.   15 tablet   0   . traZODone (DESYREL) 50 MG tablet   Oral   Take 0.5-1 tablets (25-50 mg total) by mouth at bedtime as needed for sleep.   30 tablet   3    BP 114/62  Pulse 82  Temp(Src) 98.6 F (37 C) (Oral)  Resp 21  SpO2 97% Physical Exam  Nursing note and vitals reviewed. Constitutional: She is oriented to person, place, and time. She appears well-developed and well-nourished. No distress.  HENT:  Head: Normocephalic and atraumatic.  Mouth/Throat: Oropharynx is clear and moist. No oropharyngeal exudate.  Eyes: Conjunctivae are normal.  Neck: Neck supple. Muscular tenderness present. No spinous process tenderness present. No rigidity. Decreased range of motion present.  No nuchal rigidity Decreased ROM with pain No midline tenderness Paraspinal tenderness to bilateral paraspinal muscles with palpable muscle spasm   Cardiovascular: Normal rate, regular rhythm, normal heart sounds and intact distal pulses.   No murmur heard. No tachycardia  Pulmonary/Chest: Effort normal and breath sounds normal. No respiratory distress. She has no wheezes.  Clear and equal breath sounds  Abdominal: Soft. Bowel sounds are normal. She exhibits no distension. There is no tenderness.  abd soft and nontender  Musculoskeletal:       Thoracic back: Normal.       Lumbar back: Normal.  Full range of motion of the T-spine and L-spine No tenderness to palpation of the spinous processes of the T-spine or L-spine  No tenderness to palpation of the paraspinous muscles of the L-spine  Lymphadenopathy:    She has no cervical adenopathy.  Neurological: She is alert and oriented to person, place, and time. She has normal reflexes. No cranial nerve deficit. Coordination normal.  Cranial Nerves 2-12 grossly intact Speech is clear and goal oriented, follows commands Normal strength in upper and lower extremities bilaterally including dorsiflexion and plantar flexion, strong and equal grip strength Sensation normal to light and sharp touch Moves extremities without ataxia, coordination intact Normal gait Normal balance   Skin: Skin is warm and dry. No rash noted. She is not diaphoretic. No erythema.  Psychiatric: She has a normal mood and affect. Her behavior is normal.    ED Course  Procedures (including critical care time) Labs Review Labs Reviewed - No data to display Imaging Review No results found.   EKG Interpretation None      MDM   Final diagnoses:  Cervical paraspinal muscle spasm   Xitlally [place] presents with bilateral neck spasms.  Spasm palpable on exam, limiting ROM, but pt without nuchal rigidity. Patient afebrile without petechiae or purpura. Highly doubt meningitis.  Muscle spasm likely secondary to patient carrying heavy bags on her shoulders.  The "numbness" in her face is not reproducible here in the  emergency department.  3:37 AM Patient with improvement after heat pack, Percocet and Robaxin. Recommend rest and these medications.  No numbness, weakness, loss of bowel or bladder or gait disturbance. No evidence of cauda equina. Recommend close followup with her primary care physician this week.  She is alert, oriented, nontoxic and nonseptic appearing.  It has been determined that no acute conditions requiring further emergency intervention are present at this time. The patient/guardian have been advised of the diagnosis and plan. We have discussed signs and symptoms that warrant return to the ED, such as changes or worsening in symptoms.   Vital signs are stable at discharge.   BP 114/62  Pulse 82  Temp(Src) 98.6 F (37 C) (Oral)  Resp 21  SpO2 97%  Patient/guardian has voiced understanding and agreed to follow-up with the PCP or specialist.      Abigail Butts, PA-C 09/13/13 [phone]

## 2013-09-13 NOTE — ED Notes (Signed)
PA at BS.  

## 2013-09-13 NOTE — Discharge Instructions (Signed)
1. Medications: percocet, robaxin, usual home medications 2. Treatment: rest, drink plenty of fluids, use heat, gentle stretching 3. Follow Up: Please followup with your primary doctor for discussion of your diagnoses and further evaluation after today's visit; if you do not have a primary care doctor use the resource guide provided to find one;     Heat Therapy Heat therapy can help make painful, stiff muscles and joints feel better. Do not use heat on new injuries. Wait at least 48 hours after an injury to use heat. Do not use heat when you have aches or pains right after an activity. If you still have pain 3 hours after stopping the activity, then you may use heat. HOME CARE Wet heat pack  Soak a clean towel in warm water. Squeeze out the extra water.  Put the warm, wet towel in a plastic bag.  Place a thin, dry towel between your skin and the bag.  Put the heat pack on the area for 5 minutes, and check your skin. Your skin may be pink, but it should not be red.  Leave the heat pack on the area for 15 to 30 minutes.  Repeat this every 2 to 4 hours while awake. Do not use heat while you are sleeping. Warm water bath  Fill a tub with warm water.  Place the affected body part in the tub.  Soak the area for 20 to 40 minutes.  Repeat as needed. Hot water bottle  Fill the water bottle half full with hot water.  Press out the extra air. Close the cap tightly.  Place a dry towel between your skin and the bottle.  Put the bottle on the area for 5 minutes, and check your skin. Your skin may be pink, but it should not be red.  Leave the bottle on the area for 15 to 30 minutes.  Repeat this every 2 to 4 hours while awake. Electric heating pad  Place a dry towel between your skin and the heating pad.  Set the heating pad on low heat.  Put the heating pad on the area for 10 minutes, and check your skin. Your skin may be pink, but it should not be red.  Leave the heating pad  on the area for 20 to 40 minutes.  Repeat this every 2 to 4 hours while awake.  Do not lie on the heating pad.  Do not fall asleep while using the heating pad.  Do not use the heating pad near water. GET HELP RIGHT AWAY IF:  You get blisters or red skin.  Your skin is puffy (swollen), or you lose feeling (numbness) in the affected area.  You have any new problems.  Your problems are getting worse.  You have any questions or concerns. If you have any problems, stop using heat therapy until you see your doctor. MAKE SURE YOU:  Understand these instructions.  Will watch your condition.  Will get help right away if you are not doing well or get worse. Document Released: 09/20/2011 Document Reviewed: 09/20/2011 [facility] Patient Information 2014 [place].  Torticollis, Acute You have suddenly (acutely) developed a twisted neck (torticollis). This is usually a self-limited condition. CAUSES  Acute torticollis may be caused by malposition, trauma or infection. Most commonly, acute torticollis is caused by sleeping in an awkward position. Torticollis may also be caused by the flexion, extension or twisting of the neck muscles beyond their normal position. Sometimes, the exact cause may not be known. SYMPTOMS  Usually, there is pain and limited movement of the neck. Your neck may twist to one side. DIAGNOSIS  The diagnosis is often made by physical examination. X-rays, CT scans or MRIs may be done if there is a history of trauma or concern of infection. TREATMENT  For a common, stiff neck that develops during sleep, treatment is focused on relaxing the contracted neck muscle. Medications (including shots) may be used to treat the problem. Most cases resolve in several days. Torticollis usually responds to conservative physical therapy. If left untreated, the shortened and spastic neck muscle can cause deformities in the face and neck. Rarely, surgery is required. HOME CARE  INSTRUCTIONS   Use over-the-counter and prescription medications as directed by your caregiver.  Do stretching exercises and massage the neck as directed by your caregiver.  Follow up with physical therapy if needed and as directed by your caregiver. SEEK IMMEDIATE MEDICAL CARE IF:   You develop difficulty breathing or noisy breathing (stridor).  You drool, develop trouble swallowing or have pain with swallowing.  You develop numbness or weakness in the hands or feet.  You have changes in speech or vision.  You have problems with urination or bowel movements.  You have difficulty walking.  You have a fever.  You have increased pain. MAKE SURE YOU:   Understand these instructions.  Will watch your condition.  Will get help right away if you are not doing well or get worse. Document Released: 06/25/2000 Document Revised: 09/20/2011 Document Reviewed: 08/06/2009 [facility] Patient Information 2014 [place], [place].

## 2013-09-14 NOTE — ED Provider Notes (Signed)
Medical screening examination/treatment/procedure(s) were performed by non-physician practitioner and as supervising physician I was immediately available for consultation/collaboration.    Johnna Acosta, MD 09/14/13 [phone]

## 2013-09-27 ENCOUNTER — Ambulatory Visit (INDEPENDENT_AMBULATORY_CARE_PROVIDER_SITE_OTHER): Payer: 59 | Admitting: Family Medicine

## 2013-09-27 ENCOUNTER — Encounter: Payer: Self-pay | Admitting: Family Medicine

## 2013-09-27 VITALS — BP 117/83 | HR 73 | Temp 97.9°F | Ht 67.0 in

## 2013-09-27 DIAGNOSIS — R300 Dysuria: Secondary | ICD-10-CM

## 2013-09-27 DIAGNOSIS — N390 Urinary tract infection, site not specified: Secondary | ICD-10-CM | POA: Insufficient documentation

## 2013-09-27 LAB — POCT URINALYSIS DIPSTICK
Bilirubin, UA: NEGATIVE
Glucose, UA: NEGATIVE
Ketones, UA: NEGATIVE
Nitrite, UA: NEGATIVE
PROTEIN UA: NEGATIVE
SPEC GRAV UA: 1.015
UROBILINOGEN UA: 0.2
pH, UA: 7.5

## 2013-09-27 LAB — POCT UA - MICROSCOPIC ONLY

## 2013-09-27 MED ORDER — CIPROFLOXACIN HCL 250 MG PO TABS: 250.0000 mg | ORAL_TABLET | Freq: Two times a day (BID) | ORAL | Status: DC

## 2013-09-27 NOTE — Assessment & Plan Note (Signed)
Symptomatic with leuks on urinalysis. We'll send a culture. Will empirically treat with 3 days of Cipro.

## 2013-09-27 NOTE — Progress Notes (Signed)
Patient ID: Colleen Peck    DOB: [birth year], 33 y.o.   MRN: [id number] --- Subjective:  Colleen Peck is a 33 y.o.female who presents with concern for urinary tract infection. Started 2 days ago. She started feeling dysuria after urination. She denies abdominal pain, no nausea, no vomiting. No fevers. No back pain. She has been drinking cranberry juice and water but symptoms continued to occur and she wanted to get it checked.  ROS: see HPI Past Medical History: reviewed and updated medications and allergies. Social History: Tobacco:None   Objective: Filed Vitals:   09/27/13 1554  BP: 117/83  Pulse: 73  Temp: 97.9 F (36.6 C)    Physical Examination:   General appearance - alert, well appearing, and in no distress Abdomen - soft, nontender, nondistended, no masses or organomegaly, no CVA tenderness

## 2013-09-27 NOTE — Patient Instructions (Signed)
Urinary Tract Infection  Urinary tract infections (UTIs) can develop anywhere along your urinary tract. Your urinary tract is your body's drainage system for removing wastes and extra water. Your urinary tract includes two kidneys, two ureters, a bladder, and a urethra. Your kidneys are a pair of bean-shaped organs. Each kidney is about the size of your fist. They are located below your ribs, one on each side of your spine.  CAUSES  Infections are caused by microbes, which are microscopic organisms, including fungi, viruses, and bacteria. These organisms are so small that they can only be seen through a microscope. Bacteria are the microbes that most commonly cause UTIs.  SYMPTOMS   Symptoms of UTIs may vary by age and gender of the patient and by the location of the infection. Symptoms in young women typically include a frequent and intense urge to urinate and a painful, burning feeling in the bladder or urethra during urination. Older women and men are more likely to be tired, shaky, and weak and have muscle aches and abdominal pain. A fever may mean the infection is in your kidneys. Other symptoms of a kidney infection include pain in your back or sides below the ribs, nausea, and vomiting.  DIAGNOSIS  To diagnose a UTI, your caregiver will ask you about your symptoms. Your caregiver also will ask to provide a urine sample. The urine sample will be tested for bacteria and white blood cells. White blood cells are made by your body to help fight infection.  TREATMENT   Typically, UTIs can be treated with medication. Because most UTIs are caused by a bacterial infection, they usually can be treated with the use of antibiotics. The choice of antibiotic and length of treatment depend on your symptoms and the type of bacteria causing your infection.  HOME CARE INSTRUCTIONS   If you were prescribed antibiotics, take them exactly as your caregiver instructs you. Finish the medication even if you feel better after you  have only taken some of the medication.   Drink enough water and fluids to keep your urine clear or pale yellow.   Avoid caffeine, tea, and carbonated beverages. They tend to irritate your bladder.   Empty your bladder often. Avoid holding urine for long periods of time.   Empty your bladder before and after sexual intercourse.   After a bowel movement, women should cleanse from front to back. Use each tissue only once.  SEEK MEDICAL CARE IF:    You have back pain.   You develop a fever.   Your symptoms do not begin to resolve within 3 days.  SEEK IMMEDIATE MEDICAL CARE IF:    You have severe back pain or lower abdominal pain.   You develop chills.   You have nausea or vomiting.   You have continued burning or discomfort with urination.  MAKE SURE YOU:    Understand these instructions.   Will watch your condition.   Will get help right away if you are not doing well or get worse.  Document Released: 04/07/2005 Document Revised: 12/28/2011 Document Reviewed: 08/06/2011  [employer] Patient Information 2014 [employer].

## 2013-09-29 LAB — CULTURE, URINE COMPREHENSIVE

## 2013-10-01 ENCOUNTER — Telehealth: Payer: Self-pay | Admitting: Family Medicine

## 2013-10-01 NOTE — Telephone Encounter (Signed)
LMVM asking patient to call back and speak to [place].  Anamae Rochelle, Loralyn [place], [place]

## 2013-10-01 NOTE — Telephone Encounter (Signed)
Please let patient know that the urine culture grew an insignificant amount of bacteria and she can stop the antibiotics if she is not done with them already.  If symptoms persist, she will need to be re-evaluated.   Thank you!  Liam Graham, PGY-3 Family Medicine Resident

## 2013-10-01 NOTE — Telephone Encounter (Signed)
Message given to Hassan Rowan from MD.  Lazaro Arms, CMA

## 2013-10-01 NOTE — Telephone Encounter (Signed)
Attempted phone call, no answer.  Will try again.  Colleen Peck, Colleen Peck, Colleen Peck

## 2013-11-12 ENCOUNTER — Ambulatory Visit (INDEPENDENT_AMBULATORY_CARE_PROVIDER_SITE_OTHER): Payer: Self-pay | Admitting: Emergency Medicine

## 2013-11-12 ENCOUNTER — Encounter: Payer: Self-pay | Admitting: Emergency Medicine

## 2013-11-12 VITALS — BP 130/82 | HR 97 | Temp 99.3°F | Wt 248.0 lb

## 2013-11-12 DIAGNOSIS — J189 Pneumonia, unspecified organism: Secondary | ICD-10-CM | POA: Insufficient documentation

## 2013-11-12 MED ORDER — ALBUTEROL SULFATE (2.5 MG/3ML) 0.083% IN NEBU: 2.5000 mg | INHALATION_SOLUTION | Freq: Four times a day (QID) | RESPIRATORY_TRACT | Status: DC | PRN

## 2013-11-12 MED ORDER — AZITHROMYCIN 250 MG PO TABS: ORAL_TABLET | ORAL | Status: DC

## 2013-11-12 NOTE — Assessment & Plan Note (Signed)
With lung findings and fever will treat for CAP. Z-pac sent to pharmacy. Continue symptomatic are with zyrtec, flonase, afrin. Recommended adding mucinex. F/u if not improving by Thursday.

## 2013-11-12 NOTE — Progress Notes (Signed)
   Subjective:    Patient ID: Colleen Peck, female    DOB: June 09, 1981, 32 y.o.   MRN: [id number]  HPI Colleen Peck is here for a SDA for congestion and fever.  She reports developing a cough and congestion 2 days ago. This is associated with a fever. She has measured her temperature as high as 100.4 at home. The congestion is both nasal and inferior rib cage and sternal. chest. Her cough is productive of yellow to white to green sputum. It is a painful cough in her posterior inferior rib cage and sternally. She also reports pain in these locations with deep breaths. She is wheezing at home. She is using her albuterol as needed which has been every 4-6 hours. Denies purulent needle discharge. No headaches, vomiting, abdominal pain.  Current Outpatient Prescriptions on File Prior to Visit  Medication Sig Dispense Refill  . albuterol (PROVENTIL HFA) 108 (90 BASE) MCG/ACT inhaler Inhale 2 puffs into the lungs every 6 (six) hours as needed. For shortness of breath, wheezing.  1 Inhaler  6  . ciprofloxacin (CIPRO) 250 MG tablet Take 1 tablet (250 mg total) by mouth 2 (two) times daily. Take for 3 days  6 tablet  0  . FLUoxetine (PROZAC) 40 MG capsule Take 1 capsule (40 mg total) by mouth daily.  30 capsule  5  . fluticasone (FLONASE) 50 MCG/ACT nasal spray Place 2 sprays into both nostrils daily.  16 g  6  . ibuprofen (ADVIL,MOTRIN) 200 MG tablet Take 1,000 mg by mouth every 6 (six) hours as needed for mild pain or moderate pain.      . IRON PO Take 65 mg by mouth daily.      Marland Kitchen levothyroxine (SYNTHROID, LEVOTHROID) 112 MCG tablet Take 2 tablets (224 mcg total) by mouth daily before breakfast.  60 tablet  3  . methocarbamol (ROBAXIN) 500 MG tablet Take 1 tablet (500 mg total) by mouth 2 (two) times daily.  20 tablet  0  . Multiple Vitamins-Minerals (HM MULTIVITAMIN ADULT GUMMY PO) Take 2 capsules by mouth daily.      Marland Kitchen oxyCODONE-acetaminophen (PERCOCET/ROXICET) 5-325 MG per tablet Take 1-2 tablets  by mouth every 4 (four) hours as needed for severe pain.  15 tablet  0  . traZODone (DESYREL) 50 MG tablet Take 0.5-1 tablets (25-50 mg total) by mouth at bedtime as needed for sleep.  30 tablet  3  . [DISCONTINUED] benztropine (COGENTIN) 1 MG tablet Take 0.5 mg by mouth daily.       No current facility-administered medications on file prior to visit.    I have reviewed and updated the following as appropriate: allergies and current medications SHx: non smoker   Review of Systems See HPI    Objective:   Physical Exam BP 130/82  Pulse 97  Temp(Src) 99.3 F (37.4 C) (Oral)  Wt 248 lb (112.492 kg)  LMP 10/13/2013 Gen: alert, cooperative, NAD HEENT: AT/, sclera white, MMM, no pharyngeal erythema or exudate, TMs normal bilaterally; nasal mucosa swollen; no sinus tenderness Neck: supple, no LAD CV: RRR, no murmurs Pulm: normal work of breathing; diffuse rhonchi, worse in RUL, scattered expiratory wheezes      Assessment & Plan:

## 2013-11-12 NOTE — Patient Instructions (Signed)
It was nice to meet you!  Please take Azithromycin.  This is an antibiotic. You are taking good medicines already, keep taking them. Add Mucinex twice a day.  It will help to break up the congestion.   You should start to feel better by Wednesday. If you are not improving by the end of the week, please come back.

## 2013-11-23 ENCOUNTER — Other Ambulatory Visit: Payer: Self-pay | Admitting: Sports Medicine

## 2013-11-23 ENCOUNTER — Telehealth: Payer: Self-pay | Admitting: Sports Medicine

## 2013-11-23 DIAGNOSIS — E039 Hypothyroidism, unspecified: Secondary | ICD-10-CM

## 2013-11-23 NOTE — Telephone Encounter (Signed)
Please call pt and inform that 1 month refill for synthroid has been sent in.  I have also put in lab orders that need to be done at anytime prior to her next refill.  No office visit needed at this time but we do need a lab check. It has been 3 months since her last

## 2013-11-23 NOTE — Telephone Encounter (Signed)
Left message on patient's voicemail.Colleen Peck S Colleen Peck  

## 2013-11-26 ENCOUNTER — Ambulatory Visit (INDEPENDENT_AMBULATORY_CARE_PROVIDER_SITE_OTHER): Payer: Self-pay | Admitting: Sports Medicine

## 2013-11-26 ENCOUNTER — Encounter: Payer: Self-pay | Admitting: Sports Medicine

## 2013-11-26 ENCOUNTER — Ambulatory Visit: Payer: Self-pay | Admitting: Sports Medicine

## 2013-11-26 VITALS — BP 113/71 | HR 77 | Temp 98.9°F | Ht 67.0 in | Wt 247.6 lb

## 2013-11-26 DIAGNOSIS — E039 Hypothyroidism, unspecified: Secondary | ICD-10-CM

## 2013-11-26 DIAGNOSIS — G4700 Insomnia, unspecified: Secondary | ICD-10-CM

## 2013-11-26 DIAGNOSIS — E669 Obesity, unspecified: Secondary | ICD-10-CM

## 2013-11-26 DIAGNOSIS — G479 Sleep disorder, unspecified: Secondary | ICD-10-CM | POA: Insufficient documentation

## 2013-11-26 NOTE — Patient Instructions (Signed)
It was good to see you today. We are checking your TSH today for your Synthroid.  Here are some basic exercise recommendations to remember:  Try to be active every day and throughout the day.    We have actually found that being active throughout the day is likely more important than getting to the gym 5 days per week.  Minimizing being in active should be an important health goal we all are working towards.  A basic starting point can be limiting the time that you sit or lay still during the day.  You should sit/lay/lounge for no longer than 20-30 minutes at a time if you are able.  Even interrupting sitting with standing/jumping jacks/dancing/etc for one minute can have significant health benefits.   Try to remember: "Why sit when you can stand, why stand when you can walk, why walk when you can run."  The point he is to look for opportunities during the day where you can increase your heart rate.    Ideally, I recommend you exercise for at least 30 minutes per day, 5 days per week.  This would involve any activity that will elevate your heart rate to the point that you have a hard time carrying on a normal conversation, but not to the point of being completely out of breath.  There are alternative options however this is a generally good goal to strive for.  You can adjust your intensity based on heart rate (HR).  To calculate your target HR take 220 minus your age, then multiply X 0.7 (70%).  Example for 33 year old:  89 - 67 = 180;  180 X 0.7 = 126  Try to keep your HR within 10 beats of this target throughout your exercise   I am always happy to talk more about "Exercise as medicine" if you are interested"  Here are some basic nutrition rules to remember:  "[place]" - if you think it was made in a factory . . it is best to avoid as a staple in your diet.  Limiting processed  foods to 1-2 times per week is a good idea.  Food that came from the ground, from a  tree, from a plant or is a plant, is food that you can essentially eat as much of it he would like as long as it looks like it did when it came from that place!!!  I've never seen a french fry come out of the ground!  Obviously everything should be eaten in moderation but this can be generally applied.   Limit your salt intake (in general aim for less than 3000mg  per day). Sticking with fresh fruits and vegetables as well as home cooked meals will typically provide more nutrition and less salt than prepackaged meals.     Limit the amount of sugar sweetened and artificially sweetened foods and beverages.  Avoid soda, juices and generally any bottled beverage is a good idea.  Sticking with water flavored with a slice of lemon, lime or orange is a great option if you want something with flavor in it.  Using flavored seltzer water to flavor plain water will also add some bite if you want something more than flavor.      Eat at least 3 meals and 1-2 snacks per day.  Aim for no more than 5 hours between eating.  This will actually increase your metabolism and help prevent you from overeating.   Here are  2 of my favorite web sites that provide great nutrition and exercise advice.   www.eats[place]movemore[place].com Www.choosemyplate.gov

## 2013-11-26 NOTE — Assessment & Plan Note (Signed)
Problem Based Documentation:    Subjective Report:  Some improvement with sleep onset; sleep maintenance reported as overall good.    No reported snoring, apnea or PND; however sleeps alone  EPWORTH Sleepiness Scale Read TV Public Passenger Lying Down Talking Lunch In Car, In traffic Total:  0 0 0 3 3 3  0 0 6       Assessment & Plan & Follow up Issues:  Chronic condition; persistent fatigue Epiworth as above - Patient has multiple family members with sleep apnea 1. Discussed sleep hygiene again; patient does not have insurance at this time but it may be worth considering a formal polysomnogram given body habitus, persistent fatigue and sleepiness (subjective reports of sleepiness/fatigue seem more severe than what her Epworth indicates).   > Consider polysomnogram  .

## 2013-11-26 NOTE — Assessment & Plan Note (Signed)
Chronic condition - Patient reports persistent efforts and diet and nutrition not paying off.  Reviewed food logs today that demonstrate significant amount of fast food as well as highly processed foods. - Patient interested in phentermine; per office policy no prescription can be provided 1. Encourage patient to increase vegetable intake and avoid prepackaged foods including through early meal preparation > Consider weight loss alternatives including phentermine > followup nutrition, exercise, sleep.  Consider polysomnogram as above.  > Patient should have further blood work completed once insurance obtained including CBC, ferritin, free T3, free T4, vitamin D.

## 2013-11-26 NOTE — Assessment & Plan Note (Signed)
Chronic condition - Last TSH in 3 months ago 1. Recheck TSH > Refill meds based on this.  Marland Kitchen

## 2013-11-26 NOTE — Progress Notes (Signed)
Colleen Peck - 33 y.o. female MRN [id number]  Date of birth: [birth year]  CC, SUBJECTIVE & ROS:     If applicable, see problem based charting for additional problem specific documentation.  HPI Comments: Patient presents with: Allergies - itching--took Benadryl this morning & is better.  Patient reports cough and wheezing associated with recent community-acquired pneumonia has significantly improved.  She has been using her albuterol a regular basis and still using Flonase.  No history of controller medicine and only needs albuterol during acute illnesses or during seasonal changes especially during the spring.    Hypothyroidism - No adverse effects to Synthroid.  No tachypalpitations, no thinning of hair or nail changes.    Nutrition Counseling   Insomnia - improved but still with daytime fatigue; no longer taking traz  HISTORY: Past Medical, Surgical, Social, and Family History Reviewed & Updated per EMR.  Pertinent Historical Findings include: Hx of Graves with high Synthroid requirement; obesity, difficulty sleeping, persistent fatigue  OBJECTIVE:  BP:113/71 mmHg  HR:77bpm  TEMP:98.9 F (37.2 C)(Oral)  RESP:   HT:5\' 7"  (170.2 cm)   WT:247 lb 9.6 oz (112.311 kg)  BMI:38.9 Physical Exam  Vitals reviewed. Constitutional: She is well-developed, well-nourished, and in no distress. No distress.  HENT:  Head: Normocephalic and atraumatic.  Right Ear: External ear normal.  Left Ear: External ear normal.  Eyes: Right eye exhibits no discharge. Left eye exhibits no discharge. No scleral icterus.  Neck: No JVD present. No tracheal deviation present. No thyromegaly present.  Cardiovascular: Normal rate, regular rhythm and normal heart sounds.  Exam reveals no gallop and no friction rub.   No murmur heard. Pulmonary/Chest: Effort normal and breath sounds normal. No respiratory distress. She has no wheezes. She has no rales.  Abdominal: Soft. Bowel sounds are normal. She exhibits no  distension and no mass. There is no tenderness. There is no rebound.  Musculoskeletal: She exhibits no edema and no tenderness.  Lymphadenopathy:    She has no cervical adenopathy.  Neurological: She is alert.  Moves all 4 extremities spontaneously; no lateralization.  Skin: Skin is warm and dry. She is not diaphoretic.  Psychiatric: Mood, memory, affect and judgment normal.    MEDICATIONS, LABS & OTHER ORDERS: Previous Medications   ALBUTEROL (PROVENTIL HFA) 108 (90 BASE) MCG/ACT INHALER    Inhale 2 puffs into the lungs every 6 (six) hours as needed. For shortness of breath, wheezing.   ALBUTEROL (PROVENTIL) (2.5 MG/3ML) 0.083% NEBULIZER SOLUTION    Take 3 mLs (2.5 mg total) by nebulization every 6 (six) hours as needed for shortness of breath. For shortness of breath   FLUOXETINE (PROZAC) 40 MG CAPSULE    Take 1 capsule (40 mg total) by mouth daily.   FLUTICASONE (FLONASE) 50 MCG/ACT NASAL SPRAY    Place 2 sprays into both nostrils daily.   IRON PO    Take 65 mg by mouth daily.   LEVOTHYROXINE (SYNTHROID, LEVOTHROID) 112 MCG TABLET    TAKE TWO TABLETS BY MOUTH DAILY BEFORE BREAKFAST   MULTIPLE VITAMINS-MINERALS (HM MULTIVITAMIN ADULT GUMMY PO)    Take 2 capsules by mouth daily.   Modified Medications   No medications on file   New Prescriptions   No medications on file   Discontinued Medications   AZITHROMYCIN (ZITHROMAX) 250 MG TABLET    Take 2 tablets on day 1, then 1 pill daily until gone.   CIPROFLOXACIN (CIPRO) 250 MG TABLET    Take 1 tablet (250 mg total) by mouth  2 (two) times daily. Take for 3 days   IBUPROFEN (ADVIL,MOTRIN) 200 MG TABLET    Take 1,000 mg by mouth every 6 (six) hours as needed for mild pain or moderate pain.   METHOCARBAMOL (ROBAXIN) 500 MG TABLET    Take 1 tablet (500 mg total) by mouth 2 (two) times daily.   OXYCODONE-ACETAMINOPHEN (PERCOCET/ROXICET) 5-325 MG PER TABLET    Take 1-2 tablets by mouth every 4 (four) hours as needed for severe pain.    TRAZODONE (DESYREL) 50 MG TABLET    Take 0.5-1 tablets (25-50 mg total) by mouth at bedtime as needed for sleep.  No orders of the defined types were placed in this encounter.   ASSESSMENT & PLAN: See problem based charting & AVS for pt instructions.

## 2013-11-27 LAB — T4, FREE: Free T4: 1.13 ng/dL (ref 0.80–1.80)

## 2013-11-27 LAB — TSH: TSH: 0.339 u[IU]/mL — AB (ref 0.350–4.500)

## 2013-12-17 ENCOUNTER — Ambulatory Visit (INDEPENDENT_AMBULATORY_CARE_PROVIDER_SITE_OTHER): Payer: Self-pay | Admitting: Family Medicine

## 2013-12-17 ENCOUNTER — Encounter: Payer: Self-pay | Admitting: Family Medicine

## 2013-12-17 VITALS — BP 117/56 | HR 84 | Temp 98.2°F | Ht 67.0 in | Wt 247.0 lb

## 2013-12-17 DIAGNOSIS — N6459 Other signs and symptoms in breast: Secondary | ICD-10-CM

## 2013-12-17 DIAGNOSIS — N6452 Nipple discharge: Secondary | ICD-10-CM | POA: Insufficient documentation

## 2013-12-17 NOTE — Assessment & Plan Note (Signed)
A: this seems distinct from the galactorrhea which has been present during menses and stable for many years and has already been worked up with a prolactin check in 2013; while I see nothing on exam, the differential included healed abscess, ductal ectasia, intra-ductal malignancy for fibrocystic change  P: continue to monitor at this point and if this recurs, consider a mammogram with evaluation of the ducts

## 2013-12-17 NOTE — Progress Notes (Signed)
   Subjective:    Patient ID: Colleen Peck, female    DOB: Oct 10, 1980, 33 y.o.   MRN: [id number]  HPI  33 year old F with leakage of blood and pus from breast. It is from the right breast. She felt a fullness in that area so she squeezed and this caused leakage of blood and "pus." This made it sore for a few days. She notes that she has had intermittent leakage from both nipples during her period.    PMH - piercing of the nipples taken out in 09  Fam Hx - maternal aunt with breast cancer  Review of Systems No children, no breastfeeding, no recent trauma, no fever or chills     Objective:   Physical Exam BP 117/56  Pulse 84  Temp(Src) 98.2 F (36.8 C) (Oral)  Ht 5\' 7"  (1.702 m)  Wt 247 lb (112.038 kg)  BMI 38.68 kg/m2  LMP 11/15/2013 Gen: young AAF, obese, pleasant well appearing Breast:  > Right: dense, fibrous breast tissue with out tenderness, normal areola without discharge, no fluctuance, no axillary tenderness or adenopathy > Left:  Dense, fibrous breast tissue with out tenderness, normal areola without discharge  Chaperoned by Dorcas Mcmurray, MD       Assessment & Plan:

## 2013-12-17 NOTE — Patient Instructions (Signed)
Dear Ms. Dimalanta,   Thank you for coming to clinic today. Please read below regarding the issues that we discussed.   Breast discharge - I do not see anything alarming on exam right now. We should continue to follow it for several months. If it returns, then please let me know so I can order a study of the ducts of the breast. I recommend against piercing at this time.   Please follow up in clinic with your regular doctor. Please call earlier if you have any questions or concerns.   Sincerely,   Dr. Maricela Bo

## 2014-01-18 ENCOUNTER — Telehealth: Payer: Self-pay | Admitting: Family Medicine

## 2014-01-18 ENCOUNTER — Encounter: Payer: Self-pay | Admitting: Family Medicine

## 2014-01-18 NOTE — Telephone Encounter (Signed)
Pt called because she said for the last 3 months her employer has been faxing over a form for her PCP to fill concerning her mental health. She works with children. She said that we filled one out around August or July of 2013. Her sister is bring the form today and dropping it off and waiting for it to be filled since she needs it by 01/19/14. I explained that her PCP is not her and I wasn't sure if it needed a physician signature or if the nurse could fill it out. She said that if it cannot be filled out today can we give her sister a copy of the previous form from 2013 to give to her employer. If you have any questions please call her to ask and also update her on the situation. jw

## 2014-01-18 NOTE — Progress Notes (Unsigned)
Pt's sister dropped off form to be filled out regarding staff medical report and would like for it to be faxed to [phone]

## 2014-01-18 NOTE — Telephone Encounter (Signed)
Spoke with Patient.I explain once we receive form I would try to have it completed on Monday afternoon.She voiced great appreciation.Clarie Camey, Lewie Loron '

## 2014-01-20 ENCOUNTER — Other Ambulatory Visit: Payer: Self-pay | Admitting: Sports Medicine

## 2014-01-24 ENCOUNTER — Other Ambulatory Visit: Payer: Self-pay | Admitting: Sports Medicine

## 2014-01-25 ENCOUNTER — Other Ambulatory Visit: Payer: Self-pay | Admitting: *Deleted

## 2014-01-25 MED ORDER — LEVOTHYROXINE SODIUM 112 MCG PO TABS: ORAL_TABLET | ORAL | Status: DC

## 2014-01-25 MED ORDER — FLUOXETINE HCL 40 MG PO CAPS: 40.0000 mg | ORAL_CAPSULE | Freq: Every day | ORAL | Status: DC

## 2014-01-25 NOTE — Telephone Encounter (Signed)
Prescriptions need to be resent to [facility] on [address].  Also need to prescribe 200 mg and then 50 mg for the Levothyroxine so she can pay only $8 for medication.

## 2014-01-25 NOTE — Telephone Encounter (Signed)
Patient states she has no insurance,she's having [facility] request a less expensive thyroid medication and will  schedule a follow up once insured.Colleen Peck, Colleen Peck

## 2014-01-25 NOTE — Telephone Encounter (Signed)
Message copied by Corinna Capra on Fri Jan 25, 2014  1:52 PM ------      Message from: Janora Norlander      Created: Fri Jan 25, 2014 10:47 AM      Regarding: Refills       Please inform Ms Ector that I have sent in Prozac and Synthroid with 1 additional refill.  I'd like her to schedule an office visit to meet with me and have TSH labs drawn.  Thank you. ------

## 2014-01-27 ENCOUNTER — Other Ambulatory Visit: Payer: Self-pay | Admitting: Family Medicine

## 2014-02-25 ENCOUNTER — Ambulatory Visit: Payer: Self-pay | Admitting: Family Medicine

## 2014-03-08 ENCOUNTER — Ambulatory Visit: Payer: Self-pay | Admitting: Family Medicine

## 2014-03-08 ENCOUNTER — Encounter: Payer: Self-pay | Admitting: Family Medicine

## 2014-03-08 ENCOUNTER — Other Ambulatory Visit: Payer: Self-pay | Admitting: Family Medicine

## 2014-03-08 ENCOUNTER — Other Ambulatory Visit: Payer: Self-pay

## 2014-03-08 DIAGNOSIS — E039 Hypothyroidism, unspecified: Secondary | ICD-10-CM

## 2014-03-08 MED ORDER — LEVOTHYROXINE SODIUM 112 MCG PO TABS: ORAL_TABLET | ORAL | Status: DC

## 2014-03-08 NOTE — Progress Notes (Unsigned)
Pt is out of thyroid medicine and needs prescription sent to pharmacy at [facility] on [address]

## 2014-03-08 NOTE — Progress Notes (Signed)
Refilled this in July indicating that patient needs office visit.  This will be the last RF until an appointment is made.  Please relay to patient.

## 2014-03-08 NOTE — Progress Notes (Signed)
TSH AND F-T4 DONE TODAY Lari Linson

## 2014-03-09 LAB — T4, FREE: Free T4: 1.06 ng/dL (ref 0.80–1.80)

## 2014-03-09 LAB — TSH: TSH: 0.639 u[IU]/mL (ref 0.350–4.500)

## 2014-03-11 ENCOUNTER — Telehealth: Payer: Self-pay | Admitting: *Deleted

## 2014-03-11 NOTE — Telephone Encounter (Signed)
Unable to reach patient,I'll try again later.Colleen Peck, Colleen Peck

## 2014-03-11 NOTE — Progress Notes (Unsigned)
Left message on patients voicemail.Colleen Peck, Colleen Peck

## 2014-03-19 ENCOUNTER — Other Ambulatory Visit: Payer: Self-pay | Admitting: Family Medicine

## 2014-03-19 ENCOUNTER — Telehealth: Payer: Self-pay | Admitting: Family Medicine

## 2014-03-19 DIAGNOSIS — J45909 Unspecified asthma, uncomplicated: Secondary | ICD-10-CM

## 2014-03-19 DIAGNOSIS — F418 Other specified anxiety disorders: Secondary | ICD-10-CM

## 2014-03-19 MED ORDER — ALBUTEROL SULFATE HFA 108 (90 BASE) MCG/ACT IN AERS: 2.0000 | INHALATION_SPRAY | Freq: Four times a day (QID) | RESPIRATORY_TRACT | Status: DC | PRN

## 2014-03-19 MED ORDER — ALBUTEROL SULFATE (2.5 MG/3ML) 0.083% IN NEBU: 2.5000 mg | INHALATION_SOLUTION | Freq: Four times a day (QID) | RESPIRATORY_TRACT | Status: DC | PRN

## 2014-03-19 MED ORDER — FLUOXETINE HCL 40 MG PO CAPS: 40.0000 mg | ORAL_CAPSULE | Freq: Every day | ORAL | Status: DC

## 2014-03-19 NOTE — Telephone Encounter (Signed)
Refilled Albuterol HFA and nebs and Prozac. Not sure why I don't have a schedule for next month.  Of note, I asked patient to schedule appointment months ago for initial refill encounters (so technically should have been able to see me before now).  Thyroid labs need to be done for further thyroid medication refills.  I'd be okay with patient seeing another provider in the interim to have these obtained.  Thanks!

## 2014-03-19 NOTE — Telephone Encounter (Signed)
Needs refills on her prozac, albeutrol inhaler, nebulizer,  Dr Lajuana Ripple has no appts in Sept and no schedule for Oct. She is already out in her inhaler and nebulizer and Fall is the time of yr she uses this If she cannot see another dr, she wants to change dr

## 2014-03-19 NOTE — Telephone Encounter (Signed)
Forward to PCP for refills.Busick, Robert Lee  

## 2014-03-19 NOTE — Telephone Encounter (Signed)
Refilled Albuterol HFA and nebs and Prozac.  Patient seems to be unable to see me this month, as I have no appointments available.  Of note, I asked patient to schedule appointment months ago for initial refill encounters.  Thyroid labs need to be done for further thyroid medication refills.  I'd be okay with patient seeing another provider in the interim to have these obtained.  Markela Wee M. Lajuana Ripple, DO PGY-1, [facility] 03/19/14 1:33pm

## 2014-03-20 ENCOUNTER — Ambulatory Visit (INDEPENDENT_AMBULATORY_CARE_PROVIDER_SITE_OTHER): Payer: Self-pay | Admitting: Family Medicine

## 2014-03-20 ENCOUNTER — Encounter: Payer: Self-pay | Admitting: Family Medicine

## 2014-03-20 VITALS — BP 102/60 | HR 80 | Ht 67.0 in | Wt 247.0 lb

## 2014-03-20 DIAGNOSIS — G479 Sleep disorder, unspecified: Secondary | ICD-10-CM

## 2014-03-20 MED ORDER — TRAZODONE HCL 50 MG PO TABS: 25.0000 mg | ORAL_TABLET | Freq: Every evening | ORAL | Status: DC | PRN

## 2014-03-20 MED ORDER — FLUOXETINE HCL 20 MG PO TABS: 20.0000 mg | ORAL_TABLET | Freq: Every day | ORAL | Status: DC

## 2014-03-20 NOTE — Assessment & Plan Note (Signed)
Likely 2/2 out of medications as well as increased stress from starting grad school Will resume Prozac and synthroid at prior dosing Prn trazodone while readjusts to steady state Discussed sleep hygiene again rtc in 3-4 weeks for reassessment at that time Potentially osa/sleep study eval  Waiting on insurance

## 2014-03-20 NOTE — Patient Instructions (Signed)
Ms Colleen Peck,  It was good to meet you today  Sorry about the confusion with your medications. Please start taking every day. Use trazodone as need to help with sleep initiation in the meanwhile  We will see you back here in 3-4 weeks time [facility] you feel better soon! Bernadene Bell, MD   Sleep only as much as you need to feel rested and then get out of bed Keep a regular sleep schedule  Avoid forcing sleep  Exercise regularly for at least 20 minutes, preferably 4 to 5 hours before bedtime  Avoid caffeinated beverages after lunch  Avoid alcohol near bedtime: no "night cap"  Avoid smoking, especially in the evening  Do not go to bed hungry  Adjust bedroom environment Deal with your worries before bedtime  Stimulus control 1. Go to bed only when sleepy. 2. Do not watch television, read, eat, or worry while in bed. Use bed only for sleep and sex. 3. Get out of bed if unable to fall asleep within twenty minutes and go to another room. Return to bed only when sleepy. Repeat this step as many times as necessary throughout the night. 4. Set an alarm clock to wake up at a fixed time each morning including weekends. 5. Do not take a nap during the day.

## 2014-03-20 NOTE — Progress Notes (Signed)
Patient ID: Colleen Peck, female   DOB: 1981/06/28, 33 y.o.   MRN: [id number]   [facility] Bernadene Bell, MD Phone: [phone]  Subjective:  Colleen Peck is a 33 y.o F who presents for SDA # Sleep issues  -has chronic issues with assc daytime fatigue -was previously taking trazodone in the past but not currently -knows about sleep hygiene, was last d/w Dr. Paulla Fore -no snoring, apnea or PND that she is aware of -sleeps alone -has been out of prosac and synthroid for about 3 weeks-1 month -has been having anxiety insomnia and nerve pain because of this -main issue is initiation of sleep, mind has been racing at night time frequently  -currently needs glasses, broken about 1 week ago  All relevant systems were reviewed and were negative unless otherwise noted in the HPI  Past Medical History Reviewed problem list.  Medications- reviewed and updated Current Outpatient Prescriptions  Medication Sig Dispense Refill  . albuterol (PROVENTIL HFA) 108 (90 BASE) MCG/ACT inhaler Inhale 2 puffs into the lungs every 6 (six) hours as needed. For shortness of breath, wheezing.  1 Inhaler  1  . albuterol (PROVENTIL) (2.5 MG/3ML) 0.083% nebulizer solution Take 3 mLs (2.5 mg total) by nebulization every 6 (six) hours as needed for shortness of breath. For shortness of breath  25 mL  1  . FLUoxetine (PROZAC) 40 MG capsule Take 1 capsule (40 mg total) by mouth daily.  30 capsule  1  . fluticasone (FLONASE) 50 MCG/ACT nasal spray Place 2 sprays into both nostrils daily.  16 g  6  . IRON PO Take 65 mg by mouth daily.      Marland Kitchen levothyroxine (SYNTHROID, LEVOTHROID) 112 MCG tablet TAKE TWO TABLETS BY MOUTH DAILY BEFORE BREAKFAST. NEEDS OFFICE VISIT FOR FURTHER REFILLS.  60 tablet  0  . Multiple Vitamins-Minerals (HM MULTIVITAMIN ADULT GUMMY PO) Take 2 capsules by mouth daily.      . [DISCONTINUED] benztropine (COGENTIN) 1 MG tablet Take 0.5 mg by mouth daily.       No current  facility-administered medications for this visit.   Chief complaint-noted No additions to family history Social history- patient is a never smoker; previous smoked THC in the past   Objective: BP 102/60  Pulse 80  Ht 5\' 7"  (1.702 m)  Wt 247 lb (112.038 kg)  BMI 38.68 kg/m2 Gen: NAD, alert, cooperative with exam HEENT: NCAT, EOMI, PERRL, TMs nml Neck: FROM, supple, no palpable mass (s/p radioablation of thyroid) CV: RRR, good S1/S2, no murmur, cap refill <3 Resp: CTABL, no wheezes, non-labored Neuro: Alert and oriented, No gross deficits Skin: no rashes no lesions  Assessment/Plan: See problem based a/p

## 2014-04-10 ENCOUNTER — Ambulatory Visit: Payer: Self-pay | Admitting: Family Medicine

## 2014-04-19 ENCOUNTER — Encounter (HOSPITAL_COMMUNITY): Payer: Self-pay | Admitting: Emergency Medicine

## 2014-04-19 ENCOUNTER — Emergency Department (HOSPITAL_COMMUNITY)
Admission: EM | Admit: 2014-04-19 | Discharge: 2014-04-20 | Disposition: A | Discharge status: Home / Self Care | Payer: Self-pay | Attending: Emergency Medicine | Admitting: Emergency Medicine

## 2014-04-19 ENCOUNTER — Emergency Department (HOSPITAL_COMMUNITY): Payer: Self-pay

## 2014-04-19 DIAGNOSIS — Z9889 Other specified postprocedural states: Secondary | ICD-10-CM | POA: Insufficient documentation

## 2014-04-19 DIAGNOSIS — R112 Nausea with vomiting, unspecified: Secondary | ICD-10-CM | POA: Insufficient documentation

## 2014-04-19 DIAGNOSIS — K5900 Constipation, unspecified: Secondary | ICD-10-CM | POA: Insufficient documentation

## 2014-04-19 DIAGNOSIS — R1084 Generalized abdominal pain: Secondary | ICD-10-CM | POA: Insufficient documentation

## 2014-04-19 DIAGNOSIS — Z3202 Encounter for pregnancy test, result negative: Secondary | ICD-10-CM | POA: Insufficient documentation

## 2014-04-19 DIAGNOSIS — R109 Unspecified abdominal pain: Secondary | ICD-10-CM

## 2014-04-19 DIAGNOSIS — F329 Major depressive disorder, single episode, unspecified: Secondary | ICD-10-CM | POA: Insufficient documentation

## 2014-04-19 DIAGNOSIS — Z79899 Other long term (current) drug therapy: Secondary | ICD-10-CM | POA: Insufficient documentation

## 2014-04-19 DIAGNOSIS — E079 Disorder of thyroid, unspecified: Secondary | ICD-10-CM | POA: Insufficient documentation

## 2014-04-19 DIAGNOSIS — J45909 Unspecified asthma, uncomplicated: Secondary | ICD-10-CM | POA: Insufficient documentation

## 2014-04-19 LAB — CBC WITH DIFFERENTIAL/PLATELET
Basophils Absolute: 0 10*3/uL (ref 0.0–0.1)
Basophils Relative: 0 % (ref 0–1)
EOS PCT: 3 % (ref 0–5)
Eosinophils Absolute: 0.2 10*3/uL (ref 0.0–0.7)
HEMATOCRIT: 34.4 % — AB (ref 36.0–46.0)
HEMOGLOBIN: 11 g/dL — AB (ref 12.0–15.0)
LYMPHS PCT: 36 % (ref 12–46)
Lymphs Abs: 2.2 10*3/uL (ref 0.7–4.0)
MCH: 26.5 pg (ref 26.0–34.0)
MCHC: 32 g/dL (ref 30.0–36.0)
MCV: 82.9 fL (ref 78.0–100.0)
MONO ABS: 0.3 10*3/uL (ref 0.1–1.0)
Monocytes Relative: 5 % (ref 3–12)
NEUTROS ABS: 3.4 10*3/uL (ref 1.7–7.7)
Neutrophils Relative %: 56 % (ref 43–77)
Platelets: 274 10*3/uL (ref 150–400)
RBC: 4.15 MIL/uL (ref 3.87–5.11)
RDW: 13.9 % (ref 11.5–15.5)
WBC: 6.1 10*3/uL (ref 4.0–10.5)

## 2014-04-19 LAB — URINE MICROSCOPIC-ADD ON

## 2014-04-19 LAB — URINALYSIS, ROUTINE W REFLEX MICROSCOPIC
Bilirubin Urine: NEGATIVE
GLUCOSE, UA: NEGATIVE mg/dL
Hgb urine dipstick: NEGATIVE
Ketones, ur: NEGATIVE mg/dL
NITRITE: NEGATIVE
PROTEIN: NEGATIVE mg/dL
Specific Gravity, Urine: 1.015 (ref 1.005–1.030)
UROBILINOGEN UA: 0.2 mg/dL (ref 0.0–1.0)
pH: 5.5 (ref 5.0–8.0)

## 2014-04-19 LAB — COMPREHENSIVE METABOLIC PANEL
ALT: 14 U/L (ref 0–35)
ANION GAP: 11 (ref 5–15)
AST: 17 U/L (ref 0–37)
Albumin: 3.8 g/dL (ref 3.5–5.2)
Alkaline Phosphatase: 52 U/L (ref 39–117)
BUN: 6 mg/dL (ref 6–23)
CALCIUM: 9.2 mg/dL (ref 8.4–10.5)
CHLORIDE: 104 meq/L (ref 96–112)
CO2: 25 meq/L (ref 19–32)
CREATININE: 0.81 mg/dL (ref 0.50–1.10)
GFR calc Af Amer: >90 mL/min (ref >90)
Glucose, Bld: 85 mg/dL (ref 70–99)
Potassium: 3.5 mEq/L — ABNORMAL LOW (ref 3.7–5.3)
Sodium: 140 mEq/L (ref 137–147)
Total Bilirubin: <0.2 mg/dL — ABNORMAL LOW (ref 0.3–1.2)
Total Protein: 7.6 g/dL (ref 6.0–8.3)

## 2014-04-19 LAB — PREGNANCY, URINE: Preg Test, Ur: NEGATIVE

## 2014-04-19 MED ORDER — MAGNESIUM CITRATE PO SOLN
1.0000 | Freq: Once | ORAL | Status: AC
  Administered 2014-04-19: 1 via ORAL
  Filled 2014-04-19: qty 296

## 2014-04-19 MED ORDER — MILK AND MOLASSES ENEMA
1.0000 | Freq: Once | RECTAL | Status: AC
  Administered 2014-04-19: 250 mL via RECTAL
  Filled 2014-04-19: qty 250

## 2014-04-19 NOTE — ED Provider Notes (Signed)
CSN: [id number]     Arrival date & time 04/19/14  1908 History   First MD Initiated Contact with Patient 04/19/14 2137     Chief Complaint  Patient presents with  . Constipation     (Consider location/radiation/quality/duration/timing/severity/associated sxs/prior Treatment) HPI Colleen Peck is a 33 y.o. female who presents to emergency department complaining of bloating, constipation. Her symptoms began a week ago, states she has not had a normal bowel movement in 1 week. States approximately 4 days ago she started having bloating. Yesterday she developed nausea and vomiting, 2 Dulcolax tablets, which resulted in very small bowel movement. Today when her symptoms are not improving, patient took Metamucil, and took a fleets enema. States no relief with that. Pt denies fever, chills, no blood in her stool or emesis, no vaginal or urinary complaints.   Past Medical History  Diagnosis Date  . Asthma   . Seasonal allergies   . Depression   . PTSD (post-traumatic stress disorder)   . Thyroid disease 2009    Graves disease (pt reported resolved); hypothyriodism  . Abnormal pap     pt reports abnl pap many years ago.  Nl since then.  . Palpitations 03/12/2008   Past Surgical History  Procedure Laterality Date  . Dilation and curettage of uterus  March 2006   Family History  Problem Relation Age of Onset  . Drug abuse Father   . Depression Maternal Aunt   . Depression Maternal Grandmother   . Anxiety disorder Maternal Grandmother   . COPD Maternal Grandmother   . Suicidality Cousin   . Depression Cousin   . Depression Maternal Aunt   . Hypertension Mother   . Diabetes Paternal Grandfather   . COPD Paternal Grandmother   . Heart disease Neg Hx    History  Substance Use Topics  . Smoking status: Never Smoker   . Smokeless tobacco: Never Used  . Alcohol Use: No     Comment: past use of alcohol in '08-'09   OB History   Grav Para Term Preterm Abortions TAB SAB Ect Mult Living                  Review of Systems  Constitutional: Negative for fever and chills.  Respiratory: Negative for cough, chest tightness and shortness of breath.   Cardiovascular: Negative for chest pain, palpitations and leg swelling.  Gastrointestinal: Positive for nausea, abdominal pain and constipation. Negative for vomiting, diarrhea and blood in stool.  Genitourinary: Negative for dysuria, flank pain, vaginal bleeding, vaginal discharge, vaginal pain and pelvic pain.  Musculoskeletal: Negative for arthralgias, myalgias, neck pain and neck stiffness.  Skin: Negative for rash.  Neurological: Negative for dizziness, weakness and headaches.  All other systems reviewed and are negative.     Allergies  Review of patient's allergies indicates no known allergies.  Home Medications   Prior to Admission medications   Medication Sig Start Date End Date Taking? Authorizing Provider  albuterol (PROVENTIL HFA) 108 (90 BASE) MCG/ACT inhaler Inhale 2 puffs into the lungs every 6 (six) hours as needed. For shortness of breath, wheezing. 03/19/14  Yes Ashly M Gottschalk, DO  albuterol (PROVENTIL) (2.5 MG/3ML) 0.083% nebulizer solution Take 3 mLs (2.5 mg total) by nebulization every 6 (six) hours as needed for shortness of breath. For shortness of breath 03/19/14  Yes Ashly M Gottschalk, DO  FLUoxetine (PROZAC) 20 MG tablet Take 1 tablet (20 mg total) by mouth daily. 03/20/14  Yes Bernadene Bell, MD  fluticasone (  FLONASE) 50 MCG/ACT nasal spray Place 2 sprays into both nostrils daily. 05/22/13  Yes Gerda Diss, DO  ibuprofen (ADVIL,MOTRIN) 200 MG tablet Take 200 mg by mouth every 6 (six) hours as needed for moderate pain.   Yes Historical Provider, MD  levothyroxine (SYNTHROID, LEVOTHROID) 112 MCG tablet TAKE TWO TABLETS BY MOUTH DAILY BEFORE BREAKFAST. NEEDS OFFICE VISIT FOR FURTHER REFILLS. 03/08/14  Yes Ashly Windell Moulding, DO  Multiple Vitamins-Minerals (HM MULTIVITAMIN ADULT GUMMY PO) Take 2 capsules  by mouth daily.   Yes Historical Provider, MD  traZODone (DESYREL) 50 MG tablet Take 0.5-1 tablets (25-50 mg total) by mouth at bedtime as needed for sleep. 03/20/14  Yes Bernadene Bell, MD   BP 131/73  Pulse 69  Temp(Src) 99 F (37.2 C) (Oral)  Resp 18  Ht 5\' 7"  (1.702 m)  Wt 245 lb (111.131 kg)  BMI 38.36 kg/m2  SpO2 98% Physical Exam  Nursing note and vitals reviewed. Constitutional: She appears well-developed and well-nourished. No distress.  HENT:  Head: Normocephalic.  Eyes: Conjunctivae are normal.  Neck: Neck supple.  Cardiovascular: Normal rate, regular rhythm and normal heart sounds.   Pulmonary/Chest: Effort normal and breath sounds normal. No respiratory distress. She has no wheezes. She has no rales.  Abdominal: Soft. Bowel sounds are normal. She exhibits no distension. There is tenderness. There is no rebound and no guarding.  Diffuse mild tenderness  Musculoskeletal: She exhibits no edema.  Neurological: She is alert.  Skin: Skin is warm and dry.  Psychiatric: She has a normal mood and affect. Her behavior is normal.    ED Course  Procedures (including critical care time) Labs Review Labs Reviewed  CBC WITH DIFFERENTIAL - Abnormal; Notable for the following:    Hemoglobin 11.0 (*)    HCT 34.4 (*)    All other components within normal limits  COMPREHENSIVE METABOLIC PANEL - Abnormal; Notable for the following:    Potassium 3.5 (*)    Total Bilirubin <0.2 (*)    All other components within normal limits  URINALYSIS, ROUTINE W REFLEX MICROSCOPIC - Abnormal; Notable for the following:    APPearance CLOUDY (*)    Leukocytes, UA SMALL (*)    All other components within normal limits  URINE MICROSCOPIC-ADD ON - Abnormal; Notable for the following:    Squamous Epithelial / LPF FEW (*)    Bacteria, UA FEW (*)    All other components within normal limits  PREGNANCY, URINE    Imaging Review No results found.   EKG Interpretation None      MDM   Final  diagnoses:  Abdominal pain, unspecified abdominal location  Non-intractable vomiting with nausea, vomiting of unspecified type   Pt with no good bowel movement for 1 week, now with bloating, abd distention, nausea, vomiting. Will get labs, abd xray   12:32 AM Labs and x-ray normal. Pt received milk and malases enema and mag citrate. Had large BM in ED. Feels better. abd  Continues to be benign. No surgical abdomen. Home with miralax, return precautions.   Filed Vitals:   04/19/14 2302 04/19/14 2315 04/19/14 2330 04/19/14 2345  BP: 98/50 97/46 110/65 107/74  Pulse: 62 55 57 78  Temp:      TempSrc:      Resp:      Height:      Weight:      SpO2: 100% 100% 99% 100%       Colleen Route Violeta Lecount, PA-C 04/20/14 0033

## 2014-04-19 NOTE — ED Notes (Signed)
Awaiting enema kit from [facility].  Pt states she doesn't know if she wants to do enema while at hospital.  Explained to pt that PA had ordered it and it would help determine if constipation was the reason for her abd pain.  Pt states she will think about it while we wait for enema tubing.

## 2014-04-19 NOTE — ED Notes (Signed)
Pt. reports constipation with nausea/vomitting for 1 week unrelieved by OTC laxatives and enema .

## 2014-04-20 MED ORDER — PROMETHAZINE HCL 25 MG PO TABS: 12.5000 mg | ORAL_TABLET | Freq: Four times a day (QID) | ORAL | Status: DC | PRN

## 2014-04-20 NOTE — Discharge Instructions (Signed)
Take miralax, sold over the counter, daily to help you have regular bowel movements. Drink plenty of fluids. See information below. Return if worsening pain and vomiting.   Constipation Constipation is when a person has fewer than three bowel movements a week, has difficulty having a bowel movement, or has stools that are dry, hard, or larger than normal. As people grow older, constipation is more common. If you try to fix constipation with medicines that make you have a bowel movement (laxatives), the problem may get worse. Long-term laxative use may cause the muscles of the colon to become weak. A low-fiber diet, not taking in enough fluids, and taking certain medicines may make constipation worse.  CAUSES   Certain medicines, such as antidepressants, pain medicine, iron supplements, antacids, and water pills.   Certain diseases, such as diabetes, irritable bowel syndrome (IBS), thyroid disease, or depression.   Not drinking enough water.   Not eating enough fiber-rich foods.   Stress or travel.   Lack of physical activity or exercise.   Ignoring the urge to have a bowel movement.   Using laxatives too much.  SIGNS AND SYMPTOMS   Having fewer than three bowel movements a week.   Straining to have a bowel movement.   Having stools that are hard, dry, or larger than normal.   Feeling full or bloated.   Pain in the lower abdomen.   Not feeling relief after having a bowel movement.  DIAGNOSIS  Your health care provider will take a medical history and perform a physical exam. Further testing may be done for severe constipation. Some tests may include:  A barium enema X-ray to examine your rectum, colon, and, sometimes, your small intestine.   A sigmoidoscopy to examine your lower colon.   A colonoscopy to examine your entire colon. TREATMENT  Treatment will depend on the severity of your constipation and what is causing it. Some dietary treatments include  drinking more fluids and eating more fiber-rich foods. Lifestyle treatments may include regular exercise. If these diet and lifestyle recommendations do not help, your health care provider may recommend taking over-the-counter laxative medicines to help you have bowel movements. Prescription medicines may be prescribed if over-the-counter medicines do not work.  HOME CARE INSTRUCTIONS   Eat foods that have a lot of fiber, such as fruits, vegetables, whole grains, and beans.  Limit foods high in fat and processed sugars, such as french fries, hamburgers, cookies, candies, and soda.   A fiber supplement may be added to your diet if you cannot get enough fiber from foods.   Drink enough fluids to keep your urine clear or pale yellow.   Exercise regularly or as directed by your health care provider.   Go to the restroom when you have the urge to go. Do not hold it.   Only take over-the-counter or prescription medicines as directed by your health care provider. Do not take other medicines for constipation without talking to your health care provider first.  [place] IF:   You have bright red blood in your stool.   Your constipation lasts for more than 4 days or gets worse.   You have abdominal or rectal pain.   You have thin, pencil-like stools.   You have unexplained weight loss. MAKE SURE YOU:   Understand these instructions.  Will watch your condition.  Will get help right away if you are not doing well or get worse. Document Released: 03/26/2004 Document Revised: 07/03/2013 Document Reviewed:  04/09/2013 Exit[employer] Patient Information 2015 [place], [place]. This information is not intended to replace advice given to you by your health care provider. Make sure you discuss any questions you have with your health care provider.

## 2014-04-20 NOTE — ED Provider Notes (Signed)
Medical screening examination/treatment/procedure(s) were performed by non-physician practitioner and as supervising physician I was immediately available for consultation/collaboration.     Veryl Speak, MD 04/20/14 [phone]

## 2014-04-26 ENCOUNTER — Other Ambulatory Visit: Payer: Self-pay

## 2014-05-08 ENCOUNTER — Encounter: Payer: Self-pay | Admitting: Family Medicine

## 2014-05-08 ENCOUNTER — Ambulatory Visit (INDEPENDENT_AMBULATORY_CARE_PROVIDER_SITE_OTHER): Payer: Self-pay | Admitting: Family Medicine

## 2014-05-08 VITALS — BP 120/79 | HR 77 | Temp 98.8°F | Ht 67.0 in | Wt 249.0 lb

## 2014-05-08 DIAGNOSIS — M7989 Other specified soft tissue disorders: Secondary | ICD-10-CM

## 2014-05-08 DIAGNOSIS — N6452 Nipple discharge: Secondary | ICD-10-CM

## 2014-05-08 DIAGNOSIS — E039 Hypothyroidism, unspecified: Secondary | ICD-10-CM

## 2014-05-08 DIAGNOSIS — G4733 Obstructive sleep apnea (adult) (pediatric): Secondary | ICD-10-CM

## 2014-05-08 DIAGNOSIS — G479 Sleep disorder, unspecified: Secondary | ICD-10-CM

## 2014-05-08 DIAGNOSIS — J309 Allergic rhinitis, unspecified: Secondary | ICD-10-CM

## 2014-05-08 LAB — TSH: TSH: 0.497 u[IU]/mL (ref 0.350–4.500)

## 2014-05-08 LAB — POCT GLYCOSYLATED HEMOGLOBIN (HGB A1C): HEMOGLOBIN A1C: 5.6

## 2014-05-08 MED ORDER — BECLOMETHASONE DIPROPIONATE 40 MCG/ACT IN AERS
1.0000 | INHALATION_SPRAY | Freq: Two times a day (BID) | RESPIRATORY_TRACT | Status: DC
Start: 2014-05-08 — End: 2015-03-01

## 2014-05-08 MED ORDER — FLUOXETINE HCL 20 MG PO TABS: 20.0000 mg | ORAL_TABLET | Freq: Every day | ORAL | Status: DC

## 2014-05-08 MED ORDER — FLUTICASONE PROPIONATE 50 MCG/ACT NA SUSP: 2.0000 | Freq: Every day | NASAL | Status: DC

## 2014-05-08 MED ORDER — DIMETHICONE 2 % EX CREA: 1.0000 "application " | TOPICAL_CREAM | Freq: Two times a day (BID) | CUTANEOUS | Status: DC

## 2014-05-08 MED ORDER — ALBUTEROL SULFATE (2.5 MG/3ML) 0.083% IN NEBU: 2.5000 mg | INHALATION_SOLUTION | Freq: Four times a day (QID) | RESPIRATORY_TRACT | Status: DC | PRN

## 2014-05-08 MED ORDER — ALBUTEROL SULFATE HFA 108 (90 BASE) MCG/ACT IN AERS: 2.0000 | INHALATION_SPRAY | Freq: Four times a day (QID) | RESPIRATORY_TRACT | Status: DC | PRN

## 2014-05-08 NOTE — Assessment & Plan Note (Signed)
OBtain tsh today  F/up serially with PCP Cont current dose of synthroid

## 2014-05-08 NOTE — Assessment & Plan Note (Signed)
Discharge intermittently bloody but never from nippple Actually from piercing site Advised to try mederma But ultimately may need to remove

## 2014-05-08 NOTE — Progress Notes (Signed)
Patient ID: Anyjah Roundtree, female   DOB: 09-May-1981, 33 y.o.   MRN: [id number]   [facility] Bernadene Bell, MD Phone: [phone]  Subjective:  Ms Donley is a 33 y.o F who presents for scheduled f/up  # Insomnia  -has chronic issues with assc daytime fatigue -was previously taking trazodone in the past but not currently -knows about sleep hygiene, was last d/w Dr. Paulla Fore -no snoring, apnea or PND that she is aware of but sleeps alone -resumed prozac and synthyroid at prior dosing at last visit  -Potentially needs osa/sleep study- but insurance limits in the past -has been coughing more at night time  #foot swelling -left lower dorsal aspect of foot  -no trauma that she can recall  -used to be bilat but now only left -worse at end of the day  #Infection concern -just had nipples pierced. Would like me to looks to ensure not infected -occasionally will drain serous drainage but no blood  -no fever chills or other systemic sx  All relevant systems were reviewed and were negative unless otherwise noted in the HPI  Past Medical History Reviewed problem list.  Medications- reviewed and updated Current Outpatient Prescriptions  Medication Sig Dispense Refill  . albuterol (PROVENTIL HFA) 108 (90 BASE) MCG/ACT inhaler Inhale 2 puffs into the lungs every 6 (six) hours as needed. For shortness of breath, wheezing.  1 Inhaler  1  . albuterol (PROVENTIL) (2.5 MG/3ML) 0.083% nebulizer solution Take 3 mLs (2.5 mg total) by nebulization every 6 (six) hours as needed for shortness of breath. For shortness of breath  25 mL  1  . beclomethasone (QVAR) 40 MCG/ACT inhaler Inhale 1 puff into the lungs 2 (two) times daily.  1 Inhaler  12  . DIMETHICONE, TOPICAL, 2 % CREA Apply 1 application topically 2 (two) times daily.  113.5 g  0  . FLUoxetine (PROZAC) 20 MG tablet Take 1 tablet (20 mg total) by mouth daily.  60 tablet  3  . fluticasone (FLONASE) 50 MCG/ACT nasal spray  Place 2 sprays into both nostrils daily.  16 g  6  . ibuprofen (ADVIL,MOTRIN) 200 MG tablet Take 200 mg by mouth every 6 (six) hours as needed for moderate pain.      Marland Kitchen levothyroxine (SYNTHROID, LEVOTHROID) 112 MCG tablet TAKE TWO TABLETS BY MOUTH DAILY BEFORE BREAKFAST. NEEDS OFFICE VISIT FOR FURTHER REFILLS.  60 tablet  0  . Multiple Vitamins-Minerals (HM MULTIVITAMIN ADULT GUMMY PO) Take 2 capsules by mouth daily.      . promethazine (PHENERGAN) 25 MG tablet Take 0.5-1 tablets (12.5-25 mg total) by mouth every 6 (six) hours as needed for nausea or vomiting.  10 tablet  0  . traZODone (DESYREL) 50 MG tablet Take 0.5-1 tablets (25-50 mg total) by mouth at bedtime as needed for sleep.  30 tablet  0  . [DISCONTINUED] benztropine (COGENTIN) 1 MG tablet Take 0.5 mg by mouth daily.       No current facility-administered medications for this visit.   Chief complaint-noted No additions to family history Social history- patient is a never smoker; previous smoked THC in the past   Objective: BP 120/79  Pulse 77  Temp(Src) 98.8 F (37.1 C) (Oral)  Ht 5\' 7"  (1.702 m)  Wt 249 lb (112.946 kg)  BMI 38.99 kg/m2  LMP 04/28/2014 Gen: NAD, alert, cooperative with exam HEENT: NCAT, EOMI, PERRL, TMs nml Neck: FROM, supple, no palpable mass (s/p radioablation of thyroid) Breast: bilat nipple piercing,  without active drainage, no fluctuance or masses palpated  Resp: CTABL, no wheezes, non-labored Neuro: Alert and oriented, No gross deficits Skin: no rashes no lesions  Assessment/Plan: See problem based a/p

## 2014-05-08 NOTE — Assessment & Plan Note (Signed)
Difficult to tell etiology Will cont prozac and synthroid Sleep study qvar for night time cough

## 2014-05-08 NOTE — Patient Instructions (Signed)
Ms Tieszen it was great to see you today!  I will call you if any of the blood work looks abnormal  You can try using mederma for scar formation If not better can call to schedule appointment in our derm clinic for re-evaluation  Please start using qvar for your breathing at night time   Looking forward to seeing you soon Bernadene Bell, MD

## 2014-05-08 NOTE — Assessment & Plan Note (Signed)
Likely related to obesity but will check a1c today No sensory deficits Good strong TP/DP pulses

## 2014-05-09 ENCOUNTER — Telehealth: Payer: Self-pay | Admitting: *Deleted

## 2014-05-09 NOTE — Telephone Encounter (Signed)
Borderline pre-diabetic. TSH ok to continue current dosing Will discuss head wrap with employer- end date 1 week [facility] ,MD

## 2014-05-09 NOTE — Telephone Encounter (Signed)
Received call from Bea Graff (pts manager) wants to know detail of why pt needs wrap on head.  Advised of what note said.  Remo Lipps states that she has the letter but needs more detail.  Advised pt requested this because of her sensitive skin.  Remo Lipps then tells me "well that is not a medical condition".  Advised that was all I was authorized to tell her.  She is not satisfied with anything I tell her and request to speak with MD because "this is against their dress code."  Will forward to MD.  Please call Remo Lipps @ [phone]. Faydra Korman, Salome Spotted

## 2014-05-13 ENCOUNTER — Telehealth: Payer: Self-pay | Admitting: *Deleted

## 2014-05-13 DIAGNOSIS — F329 Major depressive disorder, single episode, unspecified: Secondary | ICD-10-CM

## 2014-05-13 DIAGNOSIS — F32A Depression, unspecified: Secondary | ICD-10-CM

## 2014-05-13 MED ORDER — FLUOXETINE HCL 20 MG PO CAPS
20.0000 mg | ORAL_CAPSULE | Freq: Every day | ORAL | Status: DC
Start: 2014-05-13 — End: 2015-01-30

## 2014-05-13 NOTE — Telephone Encounter (Signed)
Received fax from [facility] stating that Prozac tablets were sent in to pharmacy 05/08/2014. They cost $193.81; pt has been on capsules.  Please change back to capsules.  Derl Barrow, RN

## 2014-05-22 ENCOUNTER — Other Ambulatory Visit: Payer: Self-pay | Admitting: Family Medicine

## 2014-06-04 ENCOUNTER — Emergency Department (HOSPITAL_BASED_OUTPATIENT_CLINIC_OR_DEPARTMENT_OTHER)
Admission: EM | Admit: 2014-06-04 | Discharge: 2014-06-04 | Disposition: A | Discharge status: Home / Self Care | Payer: Self-pay | Attending: Emergency Medicine | Admitting: Emergency Medicine

## 2014-06-04 ENCOUNTER — Encounter (HOSPITAL_BASED_OUTPATIENT_CLINIC_OR_DEPARTMENT_OTHER): Payer: Self-pay | Admitting: *Deleted

## 2014-06-04 DIAGNOSIS — Z3202 Encounter for pregnancy test, result negative: Secondary | ICD-10-CM | POA: Insufficient documentation

## 2014-06-04 DIAGNOSIS — Z7951 Long term (current) use of inhaled steroids: Secondary | ICD-10-CM | POA: Insufficient documentation

## 2014-06-04 DIAGNOSIS — E0500 Thyrotoxicosis with diffuse goiter without thyrotoxic crisis or storm: Secondary | ICD-10-CM | POA: Insufficient documentation

## 2014-06-04 DIAGNOSIS — F329 Major depressive disorder, single episode, unspecified: Secondary | ICD-10-CM | POA: Insufficient documentation

## 2014-06-04 DIAGNOSIS — Z79899 Other long term (current) drug therapy: Secondary | ICD-10-CM | POA: Insufficient documentation

## 2014-06-04 DIAGNOSIS — J45909 Unspecified asthma, uncomplicated: Secondary | ICD-10-CM | POA: Insufficient documentation

## 2014-06-04 DIAGNOSIS — M5442 Lumbago with sciatica, left side: Secondary | ICD-10-CM | POA: Insufficient documentation

## 2014-06-04 DIAGNOSIS — F4310 Post-traumatic stress disorder, unspecified: Secondary | ICD-10-CM | POA: Insufficient documentation

## 2014-06-04 LAB — URINALYSIS, ROUTINE W REFLEX MICROSCOPIC
Bilirubin Urine: NEGATIVE
Glucose, UA: NEGATIVE mg/dL
Hgb urine dipstick: NEGATIVE
KETONES UR: NEGATIVE mg/dL
LEUKOCYTES UA: NEGATIVE
Nitrite: NEGATIVE
PH: 5.5 (ref 5.0–8.0)
Protein, ur: NEGATIVE mg/dL
Specific Gravity, Urine: 1.021 (ref 1.005–1.030)
Urobilinogen, UA: 0.2 mg/dL (ref 0.0–1.0)

## 2014-06-04 LAB — PREGNANCY, URINE: Preg Test, Ur: NEGATIVE

## 2014-06-04 MED ORDER — HYDROCODONE-ACETAMINOPHEN 5-325 MG PO TABS: 1.0000 | ORAL_TABLET | Freq: Four times a day (QID) | ORAL | Status: DC | PRN

## 2014-06-04 MED ORDER — PREDNISONE 10 MG PO TABS: 20.0000 mg | ORAL_TABLET | Freq: Two times a day (BID) | ORAL | Status: DC

## 2014-06-04 NOTE — ED Provider Notes (Signed)
CSN: [id number]     Arrival date & time 06/04/14  2029 History  This chart was scribed for Veryl Speak, MD by Rayfield Citizen, ED Scribe. This patient was seen in room MH03/MH03 and the patient's care was started at 11:25 PM.    Chief Complaint  Patient presents with  . Back Pain   Patient is a 33 y.o. female presenting with back pain. The history is provided by the patient. No language interpreter was used.  Back Pain Location:  Lumbar spine Quality:  Aching Radiates to:  L posterior upper leg Pain severity:  Moderate Pain is:  Same all the time Onset quality:  Sudden Duration:  2 weeks Timing:  Constant Progression:  Worsening Chronicity:  New Context: not lifting heavy objects and not recent injury   Relieved by:  OTC medications Worsened by:  Nothing tried Ineffective treatments:  None tried Associated symptoms: no bladder incontinence, no bowel incontinence, no numbness and no weakness   Risk factors: obesity      HPI Comments: Colleen Peck is a 33 y.o. female who presents to the Emergency Department complaining of 2 weeks of lower back pain, acutely worsening over the past week. Patient reports she woke one morning with pain, believing she "slept on it wrong." She denies any recent trauma, injury, or heavy lifting. The pain radiates down her left leg. She denies bowel or bladder incontinence. She denies any weakness in the lower extremities. She has been managing her pain with OTC pain medications with mild relief.   She also notes that the top of her left foot has been swelling; she has been seen for this issue at [facility].   Patient notes she has had a pinched nerve previously and believes her current symptoms feel similar.   Past Medical History  Diagnosis Date  . Asthma   . Seasonal allergies   . Depression   . PTSD (post-traumatic stress disorder)   . Thyroid disease 2009    Graves disease (pt reported resolved); hypothyriodism  . Abnormal pap     pt  reports abnl pap many years ago.  Nl since then.  . Palpitations 03/12/2008   Past Surgical History  Procedure Laterality Date  . Dilation and curettage of uterus  March 2006   Family History  Problem Relation Age of Onset  . Drug abuse Father   . Depression Maternal Aunt   . Depression Maternal Grandmother   . Anxiety disorder Maternal Grandmother   . COPD Maternal Grandmother   . Suicidality Cousin   . Depression Cousin   . Depression Maternal Aunt   . Hypertension Mother   . Diabetes Paternal Grandfather   . COPD Paternal Grandmother   . Heart disease Neg Hx    History  Substance Use Topics  . Smoking status: Never Smoker   . Smokeless tobacco: Never Used  . Alcohol Use: No     Comment: past use of alcohol in '08-'09   OB History    No data available     Review of Systems  Gastrointestinal: Negative for bowel incontinence.  Genitourinary: Negative for bladder incontinence.  Musculoskeletal: Positive for back pain.  Neurological: Negative for weakness and numbness.   A complete 10 system review of systems was obtained and all systems are negative except as noted in the HPI and PMH.   Allergies  Review of patient's allergies indicates no known allergies.  Home Medications   Prior to Admission medications   Medication Sig Start Date End  Date Taking? Authorizing Provider  albuterol (PROVENTIL HFA) 108 (90 BASE) MCG/ACT inhaler Inhale 2 puffs into the lungs every 6 (six) hours as needed. For shortness of breath, wheezing. 05/08/14   Bernadene Bell, MD  albuterol (PROVENTIL) (2.5 MG/3ML) 0.083% nebulizer solution Take 3 mLs (2.5 mg total) by nebulization every 6 (six) hours as needed for shortness of breath. For shortness of breath 05/08/14   Bernadene Bell, MD  beclomethasone (QVAR) 40 MCG/ACT inhaler Inhale 1 puff into the lungs 2 (two) times daily. 05/08/14   Bernadene Bell, MD  DIMETHICONE, TOPICAL, 2 % CREA Apply 1 application topically 2 (two) times daily.  05/08/14   Bernadene Bell, MD  FLUoxetine (PROZAC) 20 MG capsule Take 1 capsule (20 mg total) by mouth daily. 05/13/14   Bernadene Bell, MD  fluticasone (FLONASE) 50 MCG/ACT nasal spray Place 2 sprays into both nostrils daily. 05/08/14   Bernadene Bell, MD  ibuprofen (ADVIL,MOTRIN) 200 MG tablet Take 200 mg by mouth every 6 (six) hours as needed for moderate pain.    Historical Provider, MD  levothyroxine (SYNTHROID, LEVOTHROID) 112 MCG tablet TAKE TWO TABLETS BY MOUTH ONCE DAILY BEFORE BREAKFAST 05/23/14   Janora Norlander, DO  Multiple Vitamins-Minerals (HM MULTIVITAMIN ADULT GUMMY PO) Take 2 capsules by mouth daily.    Historical Provider, MD  promethazine (PHENERGAN) 25 MG tablet Take 0.5-1 tablets (12.5-25 mg total) by mouth every 6 (six) hours as needed for nausea or vomiting. 04/20/14   Tatyana A Kirichenko, PA-C  traZODone (DESYREL) 50 MG tablet Take 0.5-1 tablets (25-50 mg total) by mouth at bedtime as needed for sleep. 03/20/14   Bernadene Bell, MD   BP 133/85 mmHg  Pulse 80  Temp(Src) 97.6 F (36.4 C) (Oral)  Resp 20  Ht 5\' 7"  (1.702 m)  Wt 249 lb (112.946 kg)  BMI 38.99 kg/m2  SpO2 100%  LMP 04/21/2014 Physical Exam  Constitutional: She is oriented to person, place, and time. She appears well-developed and well-nourished.  HENT:  Head: Normocephalic and atraumatic.  Mouth/Throat: Oropharynx is clear and moist. No oropharyngeal exudate.  Eyes: EOM are normal. Pupils are equal, round, and reactive to light.  Neck: Normal range of motion. No tracheal deviation present.  Cardiovascular: Normal rate, regular rhythm and normal heart sounds.  Exam reveals no gallop and no friction rub.   No murmur heard. Pulmonary/Chest: Effort normal and breath sounds normal. No respiratory distress. She has no wheezes. She has no rales.  Abdominal: Soft. There is no tenderness. There is no rebound and no guarding.  Musculoskeletal: Normal range of motion. She exhibits no edema.  There is  tenderness to palpation in the soft tissues of the lumbar region   Lymphadenopathy:    She has no cervical adenopathy.  Neurological: She is alert and oriented to person, place, and time.  DTRs are 1+ and equal in the lower extremities. Strength is 5/5 in BLE. Walks on heels and toes without difficulty.   Skin: Skin is warm and dry. No rash noted.  Psychiatric: She has a normal mood and affect. Her behavior is normal.  Nursing note and vitals reviewed.   ED Course  Procedures   DIAGNOSTIC STUDIES: Oxygen Saturation is 100% on RA, normal by my interpretation.    COORDINATION OF CARE: 11:27 PM Discussed treatment plan with pt at bedside and pt agreed to plan.   Labs Review Labs Reviewed  URINALYSIS, ROUTINE W REFLEX MICROSCOPIC  PREGNANCY, URINE  Imaging Review No results found.   EKG Interpretation None      MDM   Final diagnoses:  None    Patient presents for evaluation of low back pain. Her physical examination is nonfocal and there are no bowel or bladder complaints that would suggest an emergent situation per she will be treated with prednisone and hydrocodone. She is to follow-up with her primary Dr. if not improving in the next week.   I personally performed the services described in this documentation, which was scribed in my presence. The recorded information has been reviewed and is accurate.      Veryl Speak, MD 06/05/14 [phone]

## 2014-06-04 NOTE — ED Notes (Signed)
Lower back pain x 2 weeks. States she feels like she has the flu.

## 2014-06-04 NOTE — Discharge Instructions (Signed)
Prednisone as prescribed.  Hydrocodone as prescribed as needed for pain.  Follow-up with your primary Dr. if not improving in the next week.   Back Pain, Adult Back pain is very common. The pain often gets better over time. The cause of back pain is usually not dangerous. Most people can learn to manage their back pain on their own.  HOME CARE   Stay active. Start with short walks on flat ground if you can. Try to walk farther each day.  Do not sit, drive, or stand in one place for more than 30 minutes. Do not stay in bed.  Do not avoid exercise or work. Activity can help your back heal faster.  Be careful when you bend or lift an object. Bend at your knees, keep the object close to you, and do not twist.  Sleep on a firm mattress. Lie on your side, and bend your knees. If you lie on your back, put a pillow under your knees.  Only take medicines as told by your doctor.  Put ice on the injured area.  Put ice in a plastic bag.  Place a towel between your skin and the bag.  Leave the ice on for 15-20 minutes, 03-04 times a day for the first 2 to 3 days. After that, you can switch between ice and heat packs.  Ask your doctor about back exercises or massage.  Avoid feeling anxious or stressed. Find good ways to deal with stress, such as exercise. GET HELP RIGHT AWAY IF:   Your pain does not go away with rest or medicine.  Your pain does not go away in 1 week.  You have new problems.  You do not feel well.  The pain spreads into your legs.  You cannot control when you poop (bowel movement) or pee (urinate).  Your arms or legs feel weak or lose feeling (numbness).  You feel sick to your stomach (nauseous) or throw up (vomit).  You have belly (abdominal) pain.  You feel like you may pass out (faint). MAKE SURE YOU:   Understand these instructions.  Will watch your condition.  Will get help right away if you are not doing well or get worse. Document Released:  12/15/2007 Document Revised: 09/20/2011 Document Reviewed: 10/30/2013 [facility] Patient Information 2015 [place], [place]. This information is not intended to replace advice given to you by your health care provider. Make sure you discuss any questions you have with your health care provider.

## 2014-07-18 ENCOUNTER — Encounter (HOSPITAL_COMMUNITY): Payer: Self-pay | Admitting: Family Medicine

## 2014-07-18 ENCOUNTER — Emergency Department (INDEPENDENT_AMBULATORY_CARE_PROVIDER_SITE_OTHER)
Admission: EM | Admit: 2014-07-18 | Discharge: 2014-07-18 | Disposition: A | Discharge status: Home / Self Care | Payer: Self-pay | Source: Home / Self Care

## 2014-07-18 DIAGNOSIS — M5432 Sciatica, left side: Secondary | ICD-10-CM

## 2014-07-18 DIAGNOSIS — M461 Sacroiliitis, not elsewhere classified: Secondary | ICD-10-CM

## 2014-07-18 MED ORDER — PREDNISONE 10 MG PO KIT: PACK | ORAL | Status: DC

## 2014-07-18 MED ORDER — DICLOFENAC SODIUM 75 MG PO TBEC: 75.0000 mg | DELAYED_RELEASE_TABLET | Freq: Two times a day (BID) | ORAL | Status: DC

## 2014-07-18 MED ORDER — METHOCARBAMOL 500 MG PO TABS: 500.0000 mg | ORAL_TABLET | Freq: Four times a day (QID) | ORAL | Status: DC | PRN

## 2014-07-18 NOTE — ED Provider Notes (Signed)
CSN: [id number]     Arrival date & time 07/18/14  1210 History   None    Chief Complaint  Patient presents with  . Back Pain   (Consider location/radiation/quality/duration/timing/severity/associated sxs/prior Treatment) HPI   Back pain: started in November. H/o back trauma in 2005. 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 Returned after working out again in November. Went urgent care in November and given prednisone w/ benefit. L lumbar region and radiates down to L leg (knee). 671m ibuprofen w/ some relief. Back brace w/ help. Denies dysuria, abd pain, fevers, frequency.    Past Medical History  Diagnosis Date  . Asthma   . Seasonal allergies   . Depression   . PTSD (post-traumatic stress disorder)   . Thyroid disease 2009    Graves disease (pt reported resolved); hypothyriodism  . Abnormal pap     pt reports abnl pap many years ago.  Nl since then.  . Palpitations 03/12/2008   Past Surgical History  Procedure Laterality Date  . Dilation and curettage of uterus  March 2006   Family History  Problem Relation Age of Onset  . Drug abuse Father   . Depression Maternal Aunt   . Depression Maternal Grandmother   . Anxiety disorder Maternal Grandmother   . COPD Maternal Grandmother   . Suicidality Cousin   . Depression Cousin   . Depression Maternal Aunt   . Hypertension Mother   . Diabetes Paternal Grandfather   . COPD Paternal Grandmother   . Heart disease Neg Hx    History  Substance Use Topics  . Smoking status: Never Smoker   . Smokeless tobacco: Never Used  . Alcohol Use: No     Comment: past use of alcohol in '08-'09   OB History    No data available     Review of Systems Per HPI with all other pertinent systems negative.   Allergies  Review of patient's allergies indicates no known allergies.  Home Medications   Prior to Admission medications   Medication Sig Start Date End Date Taking? Authorizing Provider  FLUoxetine (PROZAC) 20 MG capsule Take 1  capsule (20 mg total) by mouth daily. 05/13/14  Yes MBernadene Bell MD  fluticasone (FLONASE) 50 MCG/ACT nasal spray Place 2 sprays into both nostrils daily. 05/08/14  Yes MBernadene Bell MD  ibuprofen (ADVIL,MOTRIN) 200 MG tablet Take 200 mg by mouth every 6 (six) hours as needed for moderate pain.   Yes Historical Provider, MD  levothyroxine (SYNTHROID, LEVOTHROID) 112 MCG tablet TAKE TWO TABLETS BY MOUTH ONCE DAILY BEFORE BREAKFAST 05/23/14  Yes Ashly M Gottschalk, DO  Multiple Vitamins-Minerals (HM MULTIVITAMIN ADULT GUMMY PO) Take 2 capsules by mouth daily.   Yes Historical Provider, MD  albuterol (PROVENTIL HFA) 108 (90 BASE) MCG/ACT inhaler Inhale 2 puffs into the lungs every 6 (six) hours as needed. For shortness of breath, wheezing. 05/08/14   MBernadene Bell MD  albuterol (PROVENTIL) (2.5 MG/3ML) 0.083% nebulizer solution Take 3 mLs (2.5 mg total) by nebulization every 6 (six) hours as needed for shortness of breath. For shortness of breath 05/08/14   MBernadene Bell MD  beclomethasone (QVAR) 40 MCG/ACT inhaler Inhale 1 puff into the lungs 2 (two) times daily. 05/08/14   MBernadene Bell MD  diclofenac (VOLTAREN) 75 MG EC tablet Take 1 tablet (75 mg total) by mouth 2 (two) times daily. 07/18/14   DWaldemar Dickens MD  DIMETHICONE, TOPICAL, 2 % CREA Apply 1 application topically 2 (  two) times daily. 05/08/14   Bernadene Bell, MD  HYDROcodone-acetaminophen (NORCO) 5-325 MG per tablet Take 1-2 tablets by mouth every 6 (six) hours as needed. 06/04/14   Veryl Speak, MD  methocarbamol (ROBAXIN) 500 MG tablet Take 1-2 tablets (500-1,000 mg total) by mouth every 6 (six) hours as needed for muscle spasms. 07/18/14   Waldemar Dickens, MD  PredniSONE 10 MG KIT 12 day dose pack 07/18/14   Waldemar Dickens, MD  promethazine (PHENERGAN) 25 MG tablet Take 0.5-1 tablets (12.5-25 mg total) by mouth every 6 (six) hours as needed for nausea or vomiting. 04/20/14   Tatyana A Kirichenko, PA-C  traZODone (DESYREL) 50  MG tablet Take 0.5-1 tablets (25-50 mg total) by mouth at bedtime as needed for sleep. 03/20/14   Bernadene Bell, MD   BP 117/68 mmHg  Pulse 72  Temp(Src) 97.7 F (36.5 C) (Oral)  Resp 18  SpO2 98%  LMP 06/16/2014 (Exact Date) Physical Exam  Constitutional: She is oriented to person, place, and time. She appears well-developed and well-nourished. No distress.  HENT:  Head: Normocephalic and atraumatic.  Eyes: EOM are normal. Pupils are equal, round, and reactive to light.  Neck: Normal range of motion.  Cardiovascular: Normal rate.   Pulmonary/Chest: Effort normal and breath sounds normal.  Abdominal: Soft.  Musculoskeletal:  FROM Minimal back pain w/ L leg extension FABERs minimally + on L Strength 5/5/ w/ flexion, extension, abduction, adduction Spin straight.   Neurological: She is alert and oriented to person, place, and time. No cranial nerve deficit. Coordination normal.  Skin: Skin is warm. She is not diaphoretic.  Psychiatric: She has a normal mood and affect. Her behavior is normal. Judgment and thought content normal.    ED Course  Procedures (including critical care time) Labs Review Labs Reviewed - No data to display  Imaging Review No results found.   MDM   1. Sciatica, left   2. Sacroiliac inflammation    Steroid dose pack Robaxin PRN Exercises given Avoid activities that worsen pain. Consider formal PT. voltaren for after steroids stop  Precautions given and all questions answered.  Linna Darner, MD Family Medicine 07/18/2014, 1:40 PM      Waldemar Dickens, MD 07/18/14 1341

## 2014-07-18 NOTE — ED Notes (Signed)
Pt has been suffering from left lower back pain that radiates to her hip and left knee since November.  She was prescribed Prednisone in November that seemed to help a little, but the pain is still here after two months.

## 2014-07-18 NOTE — Discharge Instructions (Signed)
You are suffering from sciatica and a mild sacroiliac joint inflammation Steroids will help with the inflammation Please start the exercises, limit activities that worsen the pain, stay active.  Consider going to physical therapy if this doesn't help Please start the voltaren after stopping the prednisone Please use the robaxin for relief as well, this may cause you to be more tired than normal  Sciatica with Rehab The sciatic nerve runs from the back down the leg and is responsible for sensation and control of the muscles in the back (posterior) side of the thigh, lower leg, and foot. Sciatica is a condition that is characterized by inflammation of this nerve.  SYMPTOMS   Signs of nerve damage, including numbness and/or weakness along the posterior side of the lower extremity.  Pain in the back of the thigh that may also travel down the leg.  Pain that worsens when sitting for long periods of time.  Occasionally, pain in the back or buttock. CAUSES  Inflammation of the sciatic nerve is the cause of sciatica. The inflammation is due to something irritating the nerve. Common sources of irritation include:  Sitting for long periods of time.  Direct trauma to the nerve.  Arthritis of the spine.  Herniated or ruptured disk.  Slipping of the vertebrae (spondylolisthesis).  Pressure from soft tissues, such as muscles or ligament-like tissue (fascia). RISK INCREASES WITH:  Sports that place pressure or stress on the spine (football or weightlifting).  Poor strength and flexibility.  Failure to warm up properly before activity.  Family history of low back pain or disk disorders.  Previous back injury or surgery.  Poor body mechanics, especially when lifting, or poor posture. PREVENTION   Warm up and stretch properly before activity.  Maintain physical fitness:  Strength, flexibility, and endurance.  Cardiovascular fitness.  Learn and use proper technique, especially  with posture and lifting. When possible, have coach correct improper technique.  Avoid activities that place stress on the spine. PROGNOSIS If treated properly, then sciatica usually resolves within 6 weeks. However, occasionally surgery is necessary.  RELATED COMPLICATIONS   Permanent nerve damage, including pain, numbness, tingle, or weakness.  Chronic back pain.  Risks of surgery: infection, bleeding, nerve damage, or damage to surrounding tissues. TREATMENT Treatment initially involves resting from any activities that aggravate your symptoms. The use of ice and medication may help reduce pain and inflammation. The use of strengthening and stretching exercises may help reduce pain with activity. These exercises may be performed at home or with referral to a therapist. A therapist may recommend further treatments, such as transcutaneous electronic nerve stimulation (TENS) or ultrasound. Your caregiver may recommend corticosteroid injections to help reduce inflammation of the sciatic nerve. If symptoms persist despite non-surgical (conservative) treatment, then surgery may be recommended. MEDICATION  If pain medication is necessary, then nonsteroidal anti-inflammatory medications, such as aspirin and ibuprofen, or other minor pain relievers, such as acetaminophen, are often recommended.  Do not take pain medication for 7 days before surgery.  Prescription pain relievers may be given if deemed necessary by your caregiver. Use only as directed and only as much as you need.  Ointments applied to the skin may be helpful.  Corticosteroid injections may be given by your caregiver. These injections should be reserved for the most serious cases, because they may only be given a certain number of times. HEAT AND COLD  Cold treatment (icing) relieves pain and reduces inflammation. Cold treatment should be applied for 10 to 15 minutes  every 2 to 3 hours for inflammation and pain and immediately  after any activity that aggravates your symptoms. Use ice packs or massage the area with a piece of ice (ice massage).  Heat treatment may be used prior to performing the stretching and strengthening activities prescribed by your caregiver, physical therapist, or athletic trainer. Use a heat pack or soak the injury in warm water. SEEK MEDICAL CARE IF:  Treatment seems to offer no benefit, or the condition worsens.  Any medications produce adverse side effects. EXERCISES  RANGE OF MOTION (ROM) AND STRETCHING EXERCISES - Sciatica Most people with sciatic will find that their symptoms worsen with either excessive bending forward (flexion) or arching at the low back (extension). The exercises which will help resolve your symptoms will focus on the opposite motion. Your physician, physical therapist or athletic trainer will help you determine which exercises will be most helpful to resolve your low back pain. Do not complete any exercises without first consulting with your clinician. Discontinue any exercises which worsen your symptoms until you speak to your clinician. If you have pain, numbness or tingling which travels down into your buttocks, leg or foot, the goal of the therapy is for these symptoms to move closer to your back and eventually resolve. Occasionally, these leg symptoms will get better, but your low back pain may worsen; this is typically an indication of progress in your rehabilitation. Be certain to be very alert to any changes in your symptoms and the activities in which you participated in the 24 hours prior to the change. Sharing this information with your clinician will allow him/her to most efficiently treat your condition. These exercises may help you when beginning to rehabilitate your injury. Your symptoms may resolve with or without further involvement from your physician, physical therapist or athletic trainer. While completing these exercises, remember:   Restoring tissue  flexibility helps normal motion to return to the joints. This allows healthier, less painful movement and activity.  An effective stretch should be held for at least 30 seconds.  A stretch should never be painful. You should only feel a gentle lengthening or release in the stretched tissue. FLEXION RANGE OF MOTION AND STRETCHING EXERCISES: STRETCH - Flexion, Single Knee to Chest   Lie on a firm bed or floor with both legs extended in front of you.  Keeping one leg in contact with the floor, bring your opposite knee to your chest. Hold your leg in place by either grabbing behind your thigh or at your knee.  Pull until you feel a gentle stretch in your low back. Hold __________ seconds.  Slowly release your grasp and repeat the exercise with the opposite side. Repeat __________ times. Complete this exercise __________ times per day.  STRETCH - Flexion, Double Knee to Chest  Lie on a firm bed or floor with both legs extended in front of you.  Keeping one leg in contact with the floor, bring your opposite knee to your chest.  Tense your stomach muscles to support your back and then lift your other knee to your chest. Hold your legs in place by either grabbing behind your thighs or at your knees.  Pull both knees toward your chest until you feel a gentle stretch in your low back. Hold __________ seconds.  Tense your stomach muscles and slowly return one leg at a time to the floor. Repeat __________ times. Complete this exercise __________ times per day.  STRETCH - Low Trunk Rotation   Lie on  a firm bed or floor. Keeping your legs in front of you, bend your knees so they are both pointed toward the ceiling and your feet are flat on the floor.  Extend your arms out to the side. This will stabilize your upper body by keeping your shoulders in contact with the floor.  Gently and slowly drop both knees together to one side until you feel a gentle stretch in your low back. Hold for __________  seconds.  Tense your stomach muscles to support your low back as you bring your knees back to the starting position. Repeat the exercise to the other side. Repeat __________ times. Complete this exercise __________ times per day  EXTENSION RANGE OF MOTION AND FLEXIBILITY EXERCISES: STRETCH - Extension, Prone on Elbows  Lie on your stomach on the floor, a bed will be too soft. Place your palms about shoulder width apart and at the height of your head.  Place your elbows under your shoulders. If this is too painful, stack pillows under your chest.  Allow your body to relax so that your hips drop lower and make contact more completely with the floor.  Hold this position for __________ seconds.  Slowly return to lying flat on the floor. Repeat __________ times. Complete this exercise __________ times per day.  RANGE OF MOTION - Extension, Prone Press Ups  Lie on your stomach on the floor, a bed will be too soft. Place your palms about shoulder width apart and at the height of your head.  Keeping your back as relaxed as possible, slowly straighten your elbows while keeping your hips on the floor. You may adjust the placement of your hands to maximize your comfort. As you gain motion, your hands will come more underneath your shoulders.  Hold this position __________ seconds.  Slowly return to lying flat on the floor. Repeat __________ times. Complete this exercise __________ times per day.  STRENGTHENING EXERCISES - Sciatica  These exercises may help you when beginning to rehabilitate your injury. These exercises should be done near your "sweet spot." This is the neutral, low-back arch, somewhere between fully rounded and fully arched, that is your least painful position. When performed in this safe range of motion, these exercises can be used for people who have either a flexion or extension based injury. These exercises may resolve your symptoms with or without further involvement from your  physician, physical therapist or athletic trainer. While completing these exercises, remember:   Muscles can gain both the endurance and the strength needed for everyday activities through controlled exercises.  Complete these exercises as instructed by your physician, physical therapist or athletic trainer. Progress with the resistance and repetition exercises only as your caregiver advises.  You may experience muscle soreness or fatigue, but the pain or discomfort you are trying to eliminate should never worsen during these exercises. If this pain does worsen, stop and make certain you are following the directions exactly. If the pain is still present after adjustments, discontinue the exercise until you can discuss the trouble with your clinician. STRENGTHENING - Deep Abdominals, Pelvic Tilt   Lie on a firm bed or floor. Keeping your legs in front of you, bend your knees so they are both pointed toward the ceiling and your feet are flat on the floor.  Tense your lower abdominal muscles to press your low back into the floor. This motion will rotate your pelvis so that your tail bone is scooping upwards rather than pointing at your feet or  into the floor.  With a gentle tension and even breathing, hold this position for __________ seconds. Repeat __________ times. Complete this exercise __________ times per day.  STRENGTHENING - Abdominals, Crunches   Lie on a firm bed or floor. Keeping your legs in front of you, bend your knees so they are both pointed toward the ceiling and your feet are flat on the floor. Cross your arms over your chest.  Slightly tip your chin down without bending your neck.  Tense your abdominals and slowly lift your trunk high enough to just clear your shoulder blades. Lifting higher can put excessive stress on the low back and does not further strengthen your abdominal muscles.  Control your return to the starting position. Repeat __________ times. Complete this  exercise __________ times per day.  STRENGTHENING - Quadruped, Opposite UE/LE Lift  Assume a hands and knees position on a firm surface. Keep your hands under your shoulders and your knees under your hips. You may place padding under your knees for comfort.  Find your neutral spine and gently tense your abdominal muscles so that you can maintain this position. Your shoulders and hips should form a rectangle that is parallel with the floor and is not twisted.  Keeping your trunk steady, lift your right hand no higher than your shoulder and then your left leg no higher than your hip. Make sure you are not holding your breath. Hold this position __________ seconds.  Continuing to keep your abdominal muscles tense and your back steady, slowly return to your starting position. Repeat with the opposite arm and leg. Repeat __________ times. Complete this exercise __________ times per day.  STRENGTHENING - Abdominals and Quadriceps, Straight Leg Raise   Lie on a firm bed or floor with both legs extended in front of you.  Keeping one leg in contact with the floor, bend the other knee so that your foot can rest flat on the floor.  Find your neutral spine, and tense your abdominal muscles to maintain your spinal position throughout the exercise.  Slowly lift your straight leg off the floor about 6 inches for a count of 15, making sure to not hold your breath.  Still keeping your neutral spine, slowly lower your leg all the way to the floor. Repeat this exercise with each leg __________ times. Complete this exercise __________ times per day. POSTURE AND BODY MECHANICS CONSIDERATIONS - Sciatica Keeping correct posture when sitting, standing or completing your activities will reduce the stress put on different body tissues, allowing injured tissues a chance to heal and limiting painful experiences. The following are general guidelines for improved posture. Your physician or physical therapist will provide  you with any instructions specific to your needs. While reading these guidelines, remember:  The exercises prescribed by your provider will help you have the flexibility and strength to maintain correct postures.  The correct posture provides the optimal environment for your joints to work. All of your joints have less wear and tear when properly supported by a spine with good posture. This means you will experience a healthier, less painful body.  Correct posture must be practiced with all of your activities, especially prolonged sitting and standing. Correct posture is as important when doing repetitive low-stress activities (typing) as it is when doing a single heavy-load activity (lifting). RESTING POSITIONS Consider which positions are most painful for you when choosing a resting position. If you have pain with flexion-based activities (sitting, bending, stooping, squatting), choose a position that allows  you to rest in a less flexed posture. You would want to avoid curling into a fetal position on your side. If your pain worsens with extension-based activities (prolonged standing, working overhead), avoid resting in an extended position such as sleeping on your stomach. Most people will find more comfort when they rest with their spine in a more neutral position, neither too rounded nor too arched. Lying on a non-sagging bed on your side with a pillow between your knees, or on your back with a pillow under your knees will often provide some relief. Keep in mind, being in any one position for a prolonged period of time, no matter how correct your posture, can still lead to stiffness. PROPER SITTING POSTURE In order to minimize stress and discomfort on your spine, you must sit with correct posture Sitting with good posture should be effortless for a healthy body. Returning to good posture is a gradual process. Many people can work toward this most comfortably by using various supports until they have  the flexibility and strength to maintain this posture on their own. When sitting with proper posture, your ears will fall over your shoulders and your shoulders will fall over your hips. You should use the back of the chair to support your upper back. Your low back will be in a neutral position, just slightly arched. You may place a small pillow or folded towel at the base of your low back for support.  When working at a desk, create an environment that supports good, upright posture. Without extra support, muscles fatigue and lead to excessive strain on joints and other tissues. Keep these recommendations in mind: CHAIR:   A chair should be able to slide under your desk when your back makes contact with the back of the chair. This allows you to work closely.  The chair's height should allow your eyes to be level with the upper part of your monitor and your hands to be slightly lower than your elbows. BODY POSITION  Your feet should make contact with the floor. If this is not possible, use a foot rest.  Keep your ears over your shoulders. This will reduce stress on your neck and low back. INCORRECT SITTING POSTURES   If you are feeling tired and unable to assume a healthy sitting posture, do not slouch or slump. This puts excessive strain on your back tissues, causing more damage and pain. Healthier options include:  Using more support, like a lumbar pillow.  Switching tasks to something that requires you to be upright or walking.  Talking a brief walk.  Lying down to rest in a neutral-spine position. PROLONGED STANDING WHILE SLIGHTLY LEANING FORWARD  When completing a task that requires you to lean forward while standing in one place for a long time, place either foot up on a stationary 2-4 inch high object to help maintain the best posture. When both feet are on the ground, the low back tends to lose its slight inward curve. If this curve flattens (or becomes too large), then the back and  your other joints will experience too much stress, fatigue more quickly and can cause pain.  CORRECT STANDING POSTURES Proper standing posture should be assumed with all daily activities, even if they only take a few moments, like when brushing your teeth. As in sitting, your ears should fall over your shoulders and your shoulders should fall over your hips. You should keep a slight tension in your abdominal muscles to brace your spine. Your  tailbone should point down to the ground, not behind your body, resulting in an over-extended swayback posture.  INCORRECT STANDING POSTURES  Common incorrect standing postures include a forward head, locked knees and/or an excessive swayback. WALKING Walk with an upright posture. Your ears, shoulders and hips should all line-up. PROLONGED ACTIVITY IN A FLEXED POSITION When completing a task that requires you to bend forward at your waist or lean over a low surface, try to find a way to stabilize 3 of 4 of your limbs. You can place a hand or elbow on your thigh or rest a knee on the surface you are reaching across. This will provide you more stability so that your muscles do not fatigue as quickly. By keeping your knees relaxed, or slightly bent, you will also reduce stress across your low back. CORRECT LIFTING TECHNIQUES DO :   Assume a wide stance. This will provide you more stability and the opportunity to get as close as possible to the object which you are lifting.  Tense your abdominals to brace your spine; then bend at the knees and hips. Keeping your back locked in a neutral-spine position, lift using your leg muscles. Lift with your legs, keeping your back straight.  Test the weight of unknown objects before attempting to lift them.  Try to keep your elbows locked down at your sides in order get the best strength from your shoulders when carrying an object.  Always ask for help when lifting heavy or awkward objects. INCORRECT LIFTING TECHNIQUES DO  NOT:   Lock your knees when lifting, even if it is a small object.  Bend and twist. Pivot at your feet or move your feet when needing to change directions.  Assume that you cannot safely pick up a paperclip without proper posture. Document Released: 06/28/2005 Document Revised: 11/12/2013 Document Reviewed: 10/10/2008 [facility] Patient Information 2015 [place], [place]. This information is not intended to replace advice given to you by your health care provider. Make sure you discuss any questions you have with your health care provider.

## 2014-07-31 ENCOUNTER — Ambulatory Visit (INDEPENDENT_AMBULATORY_CARE_PROVIDER_SITE_OTHER): Payer: Self-pay | Admitting: Family Medicine

## 2014-07-31 ENCOUNTER — Ambulatory Visit: Payer: Self-pay | Admitting: Family Medicine

## 2014-07-31 ENCOUNTER — Encounter: Payer: Self-pay | Admitting: Family Medicine

## 2014-07-31 VITALS — BP 139/66 | HR 92 | Temp 98.3°F | Ht 67.0 in | Wt 259.4 lb

## 2014-07-31 DIAGNOSIS — J111 Influenza due to unidentified influenza virus with other respiratory manifestations: Secondary | ICD-10-CM | POA: Insufficient documentation

## 2014-07-31 MED ORDER — OSELTAMIVIR PHOSPHATE 75 MG PO CAPS: 75.0000 mg | ORAL_CAPSULE | Freq: Two times a day (BID) | ORAL | Status: DC

## 2014-07-31 NOTE — Progress Notes (Signed)
   Subjective:  Colleen Peck is a 34 y.o. female nonsmoker here for congestion, cough, fever and body aches.  Symptoms above x 2 days worsening with acute onset. Reports she declined flu shot. She has been able to eat a little and continue drinking with normal UOP.   All other pertinent systems reviewed and are negative. Objective:  BP 139/66 mmHg  Pulse 92  Temp(Src) 98.3 F (36.8 C) (Axillary)  Ht 5\' 7"  (1.702 m)  Wt 259 lb 6.4 oz (117.663 kg)  BMI 40.62 kg/m2  LMP 07/19/2014  Gen: alert, cooperative and no distress  Head: Normocephalic, without obvious abnormality, atraumatic  Eyes: conjunctivae/corneas clear. PERRL, EOM's intact.  Ears: normal TM's and external ear canals both ears  Nose: Nares normal. Septum midline. Mucosa normal. + green drainage. No sinus tenderness.  Throat: fair dentition, lips, mucosa, and tongue normal; gums normal  Neck: no adenopathy, no carotid bruit, no JVD, supple, symmetrical, trachea midline and thyroid not enlarged, symmetric, no tenderness/mass/nodules  Lungs: respirations non-labored, clear to auscultation bilaterally  Heart: regular rate and rhythm, S1, S2 normal, no murmur, click, rub or gallop  Assessment & Plan:  Colleen Peck is a 34 y.o. female with influenza within time period of tamiflu candidacy. Obesity is only RF for complication. Stable from hemodynamic and respiratory stand points.

## 2014-07-31 NOTE — Patient Instructions (Signed)
I'm sorry you feel so bad, please take tamiflu to shorten the duration of your symptoms. This is an expensive medicine but worth it.  Return to work only after being without a fever for 24 hours.   Take care,  - Dr. Bonner Puna

## 2014-07-31 NOTE — Assessment & Plan Note (Signed)
Rx tamiflu and supportive treatment.

## 2014-08-03 ENCOUNTER — Emergency Department (HOSPITAL_COMMUNITY): Payer: Self-pay

## 2014-08-03 ENCOUNTER — Emergency Department (HOSPITAL_COMMUNITY)
Admission: EM | Admit: 2014-08-03 | Discharge: 2014-08-03 | Disposition: A | Discharge status: Home / Self Care | Payer: Self-pay | Attending: Emergency Medicine | Admitting: Emergency Medicine

## 2014-08-03 ENCOUNTER — Encounter (HOSPITAL_COMMUNITY): Payer: Self-pay | Admitting: *Deleted

## 2014-08-03 DIAGNOSIS — J189 Pneumonia, unspecified organism: Secondary | ICD-10-CM

## 2014-08-03 DIAGNOSIS — E079 Disorder of thyroid, unspecified: Secondary | ICD-10-CM | POA: Insufficient documentation

## 2014-08-03 DIAGNOSIS — R05 Cough: Secondary | ICD-10-CM

## 2014-08-03 DIAGNOSIS — F329 Major depressive disorder, single episode, unspecified: Secondary | ICD-10-CM | POA: Insufficient documentation

## 2014-08-03 DIAGNOSIS — Z7951 Long term (current) use of inhaled steroids: Secondary | ICD-10-CM | POA: Insufficient documentation

## 2014-08-03 DIAGNOSIS — R059 Cough, unspecified: Secondary | ICD-10-CM

## 2014-08-03 DIAGNOSIS — J45901 Unspecified asthma with (acute) exacerbation: Secondary | ICD-10-CM | POA: Insufficient documentation

## 2014-08-03 DIAGNOSIS — Z791 Long term (current) use of non-steroidal anti-inflammatories (NSAID): Secondary | ICD-10-CM | POA: Insufficient documentation

## 2014-08-03 DIAGNOSIS — M791 Myalgia: Secondary | ICD-10-CM | POA: Insufficient documentation

## 2014-08-03 DIAGNOSIS — J159 Unspecified bacterial pneumonia: Secondary | ICD-10-CM | POA: Insufficient documentation

## 2014-08-03 DIAGNOSIS — Z79899 Other long term (current) drug therapy: Secondary | ICD-10-CM | POA: Insufficient documentation

## 2014-08-03 MED ORDER — AZITHROMYCIN 250 MG PO TABS: 250.0000 mg | ORAL_TABLET | Freq: Every day | ORAL | Status: DC

## 2014-08-03 MED ORDER — ALBUTEROL SULFATE (2.5 MG/3ML) 0.083% IN NEBU
5.0000 mg | INHALATION_SOLUTION | Freq: Once | RESPIRATORY_TRACT | Status: AC
  Administered 2014-08-03: 5 mg via RESPIRATORY_TRACT
  Filled 2014-08-03: qty 6

## 2014-08-03 MED ORDER — IPRATROPIUM BROMIDE 0.02 % IN SOLN
0.5000 mg | Freq: Once | RESPIRATORY_TRACT | Status: AC
  Administered 2014-08-03: 0.5 mg via RESPIRATORY_TRACT
  Filled 2014-08-03: qty 2.5

## 2014-08-03 MED ORDER — ALBUTEROL SULFATE HFA 108 (90 BASE) MCG/ACT IN AERS: 2.0000 | INHALATION_SPRAY | RESPIRATORY_TRACT | Status: DC | PRN

## 2014-08-03 MED ORDER — PREDNISONE 20 MG PO TABS
60.0000 mg | ORAL_TABLET | Freq: Once | ORAL | Status: AC
  Administered 2014-08-03: 60 mg via ORAL
  Filled 2014-08-03: qty 3

## 2014-08-03 NOTE — Discharge Instructions (Signed)
Please follow the directions provided.  Be sure to follow-up with your primary care provider in 2 days to ensure you are getting better.  Take the antibiotic as directed. Use the albuterol 2 puffs every 4 hours for cough and shortness of breath. You may continue to take your multi-symptom medicine as needed.  Don't hesitate to return for any new, worsening or concerning symptoms.     SEEK IMMEDIATE MEDICAL CARE IF:  Your illness becomes worse. This is especially true if you are elderly or weakened from any other disease.  You cannot control your cough with suppressants and are losing sleep.  You begin coughing up blood.  You develop pain which is getting worse or is uncontrolled with medicines.  Any of the symptoms which initially brought you in for treatment are getting worse rather than better.  You develop shortness of breath or chest pain.

## 2014-08-03 NOTE — ED Notes (Signed)
Patient transported to X-ray 

## 2014-08-03 NOTE — ED Notes (Signed)
Rt from xray

## 2014-08-03 NOTE — ED Provider Notes (Signed)
CSN: [id number]     Arrival date & time 08/03/14  1741 History   First MD Initiated Contact with Patient 08/03/14 1752     Chief Complaint  Patient presents with  . Influenza  . Cough    (Consider location/radiation/quality/duration/timing/severity/associated sxs/prior Treatment) HPI Colleen Peck is a 34 yo female presenting with report of persistent cough and shortness of breath after being treated for the flu.  She reports developing nasal congestion, scratchy throat and non-productive cough 5 days ago.  The following day she develop a fever to 103.5 and general muscle aches.  She was seen by her doctor and diagnosed with the flu. She started Tami-flu four days ago and felt like her fever improving but her cough has worsened and is now productive of yellowish/tan sputum.  She reports shortness of breath with coughing and continued fatigue.  She also complains of burning in chest when coughing.  She denies vomiting, diarrhea, abd pain, or urinary symptoms.  Her LMP was 07/19/13.   Past Medical History  Diagnosis Date  . Asthma   . Seasonal allergies   . Depression   . PTSD (post-traumatic stress disorder)   . Thyroid disease 2009    Graves disease (pt reported resolved); hypothyriodism  . Abnormal pap     pt reports abnl pap many years ago.  Nl since then.  . Palpitations 03/12/2008   Past Surgical History  Procedure Laterality Date  . Dilation and curettage of uterus  March 2006   Family History  Problem Relation Age of Onset  . Drug abuse Father   . Depression Maternal Aunt   . Depression Maternal Grandmother   . Anxiety disorder Maternal Grandmother   . COPD Maternal Grandmother   . Suicidality Cousin   . Depression Cousin   . Depression Maternal Aunt   . Hypertension Mother   . Diabetes Paternal Grandfather   . COPD Paternal Grandmother   . Heart disease Neg Hx    History  Substance Use Topics  . Smoking status: Never Smoker   . Smokeless tobacco: Never Used  .  Alcohol Use: No     Comment: past use of alcohol in '08-'09   OB History    No data available     Review of Systems  Constitutional: Positive for fever and fatigue. Negative for chills.  HENT: Positive for congestion, rhinorrhea and sore throat.   Eyes: Negative for visual disturbance.  Respiratory: Positive for cough and shortness of breath.   Cardiovascular: Negative for chest pain and leg swelling.  Gastrointestinal: Negative for nausea, vomiting and diarrhea.  Genitourinary: Negative for dysuria.  Musculoskeletal: Positive for myalgias.  Skin: Negative for rash.  Neurological: Negative for weakness, numbness and headaches.    Allergies  Review of patient's allergies indicates no known allergies.  Home Medications   Prior to Admission medications   Medication Sig Start Date End Date Taking? Authorizing Provider  albuterol (PROVENTIL HFA) 108 (90 BASE) MCG/ACT inhaler Inhale 2 puffs into the lungs every 6 (six) hours as needed. For shortness of breath, wheezing. 05/08/14   Bernadene Bell, MD  albuterol (PROVENTIL) (2.5 MG/3ML) 0.083% nebulizer solution Take 3 mLs (2.5 mg total) by nebulization every 6 (six) hours as needed for shortness of breath. For shortness of breath 05/08/14   Bernadene Bell, MD  beclomethasone (QVAR) 40 MCG/ACT inhaler Inhale 1 puff into the lungs 2 (two) times daily. 05/08/14   Bernadene Bell, MD  diclofenac (VOLTAREN) 75 MG EC tablet Take  1 tablet (75 mg total) by mouth 2 (two) times daily. 07/18/14   Waldemar Dickens, MD  DIMETHICONE, TOPICAL, 2 % CREA Apply 1 application topically 2 (two) times daily. 05/08/14   Bernadene Bell, MD  FLUoxetine (PROZAC) 20 MG capsule Take 1 capsule (20 mg total) by mouth daily. 05/13/14   Bernadene Bell, MD  fluticasone (FLONASE) 50 MCG/ACT nasal spray Place 2 sprays into both nostrils daily. 05/08/14   Bernadene Bell, MD  HYDROcodone-acetaminophen (NORCO) 5-325 MG per tablet Take 1-2 tablets by mouth every 6 (six) hours  as needed. 06/04/14   Veryl Speak, MD  ibuprofen (ADVIL,MOTRIN) 200 MG tablet Take 200 mg by mouth every 6 (six) hours as needed for moderate pain.    Historical Provider, MD  levothyroxine (SYNTHROID, LEVOTHROID) 112 MCG tablet TAKE TWO TABLETS BY MOUTH ONCE DAILY BEFORE BREAKFAST 05/23/14   Ashly Windell Moulding, DO  methocarbamol (ROBAXIN) 500 MG tablet Take 1-2 tablets (500-1,000 mg total) by mouth every 6 (six) hours as needed for muscle spasms. 07/18/14   Waldemar Dickens, MD  Multiple Vitamins-Minerals (HM MULTIVITAMIN ADULT GUMMY PO) Take 2 capsules by mouth daily.    Historical Provider, MD  oseltamivir (TAMIFLU) 75 MG capsule Take 1 capsule (75 mg total) by mouth 2 (two) times daily. Complete 5 days of treatment. 07/31/14   Patrecia Pour, MD  PredniSONE 10 MG KIT 12 day dose pack 07/18/14   Waldemar Dickens, MD  promethazine (PHENERGAN) 25 MG tablet Take 0.5-1 tablets (12.5-25 mg total) by mouth every 6 (six) hours as needed for nausea or vomiting. 04/20/14   Tatyana A Kirichenko, PA-C  traZODone (DESYREL) 50 MG tablet Take 0.5-1 tablets (25-50 mg total) by mouth at bedtime as needed for sleep. 03/20/14   Bernadene Bell, MD   BP 105/58 mmHg  Pulse 83  Temp(Src) 99.4 F (37.4 C) (Oral)  Resp 18  SpO2 96%  LMP 07/19/2014 Physical Exam  Constitutional: She is oriented to person, place, and time. She appears well-developed and well-nourished. No distress.  HENT:  Head: Normocephalic and atraumatic.  Mouth/Throat: Oropharynx is clear and moist. No oropharyngeal exudate.  Eyes: Conjunctivae are normal.  Neck: Neck supple. No thyromegaly present.  Cardiovascular: Normal rate, regular rhythm and intact distal pulses.   Pulmonary/Chest: Effort normal. No respiratory distress. She has no decreased breath sounds. She has wheezes in the right middle field, the right lower field, the left middle field and the left lower field. She has no rhonchi. She has no rales. She exhibits no tenderness.  Abdominal:  Soft. There is no tenderness.  Musculoskeletal: She exhibits no tenderness.  Lymphadenopathy:    She has no cervical adenopathy.  Neurological: She is alert and oriented to person, place, and time.  Skin: Skin is warm and dry. No rash noted. She is not diaphoretic.  Psychiatric: She has a normal mood and affect.  Nursing note and vitals reviewed.   ED Course  Procedures (including critical care time) Labs Review Labs Reviewed - No data to display  Imaging Review Dg Chest 2 View  08/03/2014   CLINICAL DATA:  Acute onset of shortness of breath, cough and congestion. Initial encounter.  EXAM: CHEST  2 VIEW  COMPARISON:  Chest radiograph from 07/14/2012  FINDINGS: The lungs are well-aerated. Patchy bilateral airspace opacities raise concern for multifocal pneumonia. Mild peribronchial thickening is seen. There is no evidence of pleural effusion or pneumothorax.  The heart is normal in size; the mediastinal contour  is within normal limits. No acute osseous abnormalities are seen. Bilateral metallic nipple piercings are noted.  IMPRESSION: Patchy bilateral airspace opacities raise concern for multifocal pneumonia. Mild peribronchial thickening seen.   Electronically Signed   By: Garald Balding M.D.   On: 08/03/2014 19:06     EKG Interpretation None      MDM   Final diagnoses:  Cough  Community acquired pneumonia   34 yo with persistent cough and shortness of breath, recently diagnosed with flu, on 4th day of tamiflu treatment. Will eval for PNA, Albuterol Tx for wheezes and prednisone due to hx of asthma. Patient has been diagnosed with multi focal pneumonia via chest xray. Discussed case with Dr. Colin Rhein, will treat pt for CAP. She is not ill appearing, no history of immunocompromised diagnosis, and does not have multiple co morbidities, therefore I feel like the they can be discharged home and treated with prescription of azithromycin. Pt has been advised to follow-up with her PCP in 2 days  for re-check or return to the ED if symptoms worsen or they do not improve. Pt verbalizes understanding and is agreeable with plan.    Filed Vitals:   08/03/14 1815 08/03/14 1830 08/03/14 1845 08/03/14 1924  BP: 103/88 110/76 107/61 107/61  Pulse: 83 73 92 98  Temp:    98.5 F (36.9 C)  TempSrc:    Oral  Resp:    18  SpO2: 97% 100% 95% 100%   Meds given in ED:  Medications  albuterol (PROVENTIL) (2.5 MG/3ML) 0.083% nebulizer solution 5 mg (5 mg Nebulization Given 08/03/14 1820)  ipratropium (ATROVENT) nebulizer solution 0.5 mg (0.5 mg Nebulization Given 08/03/14 1821)  predniSONE (DELTASONE) tablet 60 mg (60 mg Oral Given 08/03/14 1922)    New Prescriptions   ALBUTEROL (PROVENTIL HFA;VENTOLIN HFA) 108 (90 BASE) MCG/ACT INHALER    Inhale 2 puffs into the lungs every 4 (four) hours as needed for wheezing or shortness of breath.   AZITHROMYCIN (ZITHROMAX) 250 MG TABLET    Take 1 tablet (250 mg total) by mouth daily. Take first 2 tablets together, then 1 every day until finished.       Britt Bottom, NP 08/03/14 1932  Debby Freiberg, MD 08/03/14 2350

## 2014-08-03 NOTE — ED Notes (Signed)
Pt reports having been diagnosed with flu recently and taking tamiflu. Pt still has productive cough with brown sputum which is causing sob, reports having fever and fatigue. Airway intact at triage.

## 2014-08-05 ENCOUNTER — Encounter: Payer: Self-pay | Admitting: Family Medicine

## 2014-08-05 ENCOUNTER — Ambulatory Visit (INDEPENDENT_AMBULATORY_CARE_PROVIDER_SITE_OTHER): Payer: Self-pay | Admitting: Family Medicine

## 2014-08-05 VITALS — BP 134/68 | HR 85 | Temp 98.4°F | Resp 20 | Wt 256.0 lb

## 2014-08-05 DIAGNOSIS — J189 Pneumonia, unspecified organism: Secondary | ICD-10-CM | POA: Insufficient documentation

## 2014-08-05 MED ORDER — ALBUTEROL SULFATE (2.5 MG/3ML) 0.083% IN NEBU
2.5000 mg | INHALATION_SOLUTION | Freq: Once | RESPIRATORY_TRACT | Status: AC
Start: 2014-08-05 — End: 2014-08-05
  Administered 2014-08-05: 2.5 mg via RESPIRATORY_TRACT

## 2014-08-05 MED ORDER — ALBUTEROL SULFATE (2.5 MG/3ML) 0.083% IN NEBU: 2.5000 mg | INHALATION_SOLUTION | Freq: Four times a day (QID) | RESPIRATORY_TRACT | Status: DC | PRN

## 2014-08-05 NOTE — Progress Notes (Signed)
   Subjective:    Patient ID: Colleen Peck, female    DOB: 09/18/1980, 34 y.o.   MRN: [id number]  HPI  Community acquired pneumonia/ED follow-up: Patient was seen last week in was diagnosed with influenza. She was given Tamiflu, which afterwards she experienced more cough, fever or malaise. She was to the ED, and was diagnosed with pneumonia at that time. She was started on azithromycin pack. She reports she is feeling much better. She denies any fevers. Her energy is increasing, but still limited. She reports she thinks she has enough energy to get tasks done, and then feels very fatigued after. Her appetite has returned and she is eating small amounts.. She is drinking plenty of fluids. She is pre-K Pharmacist, hospital.  Past Medical History  Diagnosis Date  . Asthma   . Seasonal allergies   . Depression   . PTSD (post-traumatic stress disorder)   . Thyroid disease 2009    Graves disease (pt reported resolved); hypothyriodism  . Abnormal pap     pt reports abnl pap many years ago.  Nl since then.  . Palpitations 03/12/2008   No Known Allergies  Review of Systems Per history of present illness    Objective:   Physical Exam BP 134/68 mmHg  Pulse 85  Temp(Src) 98.4 F (36.9 C) (Oral)  Resp 20  Wt 256 lb (116.121 kg)  SpO2 98%  LMP 07/19/2014 Gen: NAD. Very pleasant, African American female, well-developed, well-nourished, alert, oriented 3, coughing exam room. HEENT: AT. [place].  Bilateral eyes without injections or icterus. MMM. Bilateral nares without erythema or swelling. Throat without erythema or exudates.  CV: RRR  Chest:  bilateral expiratory wheezes diffusely, no rhonchi, no crackles Abd: Soft.NTND. BS present    Assessment & Plan:

## 2014-08-05 NOTE — Assessment & Plan Note (Addendum)
Patient improving well, however her an albuterol inhaler. -- continue antibiotics as prescribed, azithromycin -- Albuterol treatment in clinic -- Stay well-hydrated -- Use humidifier in bedroom if necessary -- Refill albuterol inhaler today, you can use this every 6 hours for the next 48 hours. -- Turn to work on Wednesday, excuse provided -- Follow-up as necessary

## 2014-08-05 NOTE — Patient Instructions (Signed)
His pickup your albuterol inhaler, you can use this every 6 hours 1-2 puffs for the next 48 hours. Continue to take your antibiotics until they are completed. Return to work excuse for Wednesday. You may also be helpful to have an humidifier in her bedroom. Your cough will likely hold on for a week or 2, but will improve. Pneumonia Pneumonia is an infection of the lungs.  CAUSES Pneumonia may be caused by bacteria or a virus. Usually, these infections are caused by breathing infectious particles into the lungs (respiratory tract). SIGNS AND SYMPTOMS   Cough.  Fever.  Chest pain.  Increased rate of breathing.  Wheezing.  Mucus production. DIAGNOSIS  If you have the common symptoms of pneumonia, your health care provider will typically confirm the diagnosis with a chest X-ray. The X-ray will show an abnormality in the lung (pulmonary infiltrate) if you have pneumonia. Other tests of your blood, urine, or sputum may be done to find the specific cause of your pneumonia. Your health care provider may also do tests (blood gases or pulse oximetry) to see how well your lungs are working. TREATMENT  Some forms of pneumonia may be spread to other people when you cough or sneeze. You may be asked to wear a mask before and during your exam. Pneumonia that is caused by bacteria is treated with antibiotic medicine. Pneumonia that is caused by the influenza virus may be treated with an antiviral medicine. Most other viral infections must run their course. These infections will not respond to antibiotics.  HOME CARE INSTRUCTIONS   Cough suppressants may be used if you are losing too much rest. However, coughing protects you by clearing your lungs. You should avoid using cough suppressants if you can.  Your health care provider may have prescribed medicine if he or she thinks your pneumonia is caused by bacteria or influenza. Finish your medicine even if you start to feel better.  Your health care  provider may also prescribe an expectorant. This loosens the mucus to be coughed up.  Take medicines only as directed by your health care provider.  Do not smoke. Smoking is a common cause of bronchitis and can contribute to pneumonia. If you are a smoker and continue to smoke, your cough may last several weeks after your pneumonia has cleared.  A cold steam vaporizer or humidifier in your room or home may help loosen mucus.  Coughing is often worse at night. Sleeping in a semi-upright position in a recliner or using a couple pillows under your head will help with this.  Get rest as you feel it is needed. Your body will usually let you know when you need to rest. PREVENTION A pneumococcal shot (vaccine) is available to prevent a common bacterial cause of pneumonia. This is usually suggested for:  People over 109 years old.  Patients on chemotherapy.  People with chronic lung problems, such as bronchitis or emphysema.  People with immune system problems. If you are over 65 or have a high risk condition, you may receive the pneumococcal vaccine if you have not received it before. In some countries, a routine influenza vaccine is also recommended. This vaccine can help prevent some cases of pneumonia.You may be offered the influenza vaccine as part of your care. If you smoke, it is time to quit. You may receive instructions on how to stop smoking. Your health care provider can provide medicines and counseling to help you quit. SEEK MEDICAL CARE IF: You have a fever. SEEK  IMMEDIATE MEDICAL CARE IF:   Your illness becomes worse. This is especially true if you are elderly or weakened from any other disease.  You cannot control your cough with suppressants and are losing sleep.  You begin coughing up blood.  You develop pain which is getting worse or is uncontrolled with medicines.  Any of the symptoms which initially brought you in for treatment are getting worse rather than  better.  You develop shortness of breath or chest pain. MAKE SURE YOU:   Understand these instructions.  Will watch your condition.  Will get help right away if you are not doing well or get worse. Document Released: 06/28/2005 Document Revised: 11/12/2013 Document Reviewed: 09/17/2010 [facility] Patient Information 2015 [place], [place]. This information is not intended to replace advice given to you by your health care provider. Make sure you discuss any questions you have with your health care provider.

## 2014-09-04 ENCOUNTER — Encounter: Payer: Self-pay | Admitting: Family Medicine

## 2014-09-04 ENCOUNTER — Ambulatory Visit (INDEPENDENT_AMBULATORY_CARE_PROVIDER_SITE_OTHER): Payer: 59 | Admitting: Family Medicine

## 2014-09-04 ENCOUNTER — Other Ambulatory Visit (HOSPITAL_COMMUNITY)
Admission: RE | Admit: 2014-09-04 | Discharge: 2014-09-04 | Disposition: A | Discharge status: Home / Self Care | Payer: 59 | Source: Ambulatory Visit | Attending: Family Medicine | Admitting: Family Medicine

## 2014-09-04 VITALS — BP 119/80 | HR 82 | Temp 98.4°F | Ht 67.0 in | Wt 255.0 lb

## 2014-09-04 DIAGNOSIS — Z0000 Encounter for general adult medical examination without abnormal findings: Secondary | ICD-10-CM

## 2014-09-04 DIAGNOSIS — Z1151 Encounter for screening for human papillomavirus (HPV): Secondary | ICD-10-CM | POA: Insufficient documentation

## 2014-09-04 DIAGNOSIS — H9193 Unspecified hearing loss, bilateral: Secondary | ICD-10-CM

## 2014-09-04 DIAGNOSIS — L910 Hypertrophic scar: Secondary | ICD-10-CM

## 2014-09-04 DIAGNOSIS — F418 Other specified anxiety disorders: Secondary | ICD-10-CM

## 2014-09-04 DIAGNOSIS — Z113 Encounter for screening for infections with a predominantly sexual mode of transmission: Secondary | ICD-10-CM

## 2014-09-04 DIAGNOSIS — Z124 Encounter for screening for malignant neoplasm of cervix: Secondary | ICD-10-CM

## 2014-09-04 DIAGNOSIS — E039 Hypothyroidism, unspecified: Secondary | ICD-10-CM

## 2014-09-04 DIAGNOSIS — Z01419 Encounter for gynecological examination (general) (routine) without abnormal findings: Secondary | ICD-10-CM | POA: Insufficient documentation

## 2014-09-04 DIAGNOSIS — E669 Obesity, unspecified: Secondary | ICD-10-CM

## 2014-09-04 LAB — BASIC METABOLIC PANEL
BUN: 13 mg/dL (ref 6–23)
CHLORIDE: 101 meq/L (ref 96–112)
CO2: 26 mEq/L (ref 19–32)
Calcium: 9.7 mg/dL (ref 8.4–10.5)
Creat: 0.95 mg/dL (ref 0.50–1.10)
GLUCOSE: 73 mg/dL (ref 70–99)
Potassium: 4.1 mEq/L (ref 3.5–5.3)
Sodium: 136 mEq/L (ref 135–145)

## 2014-09-04 LAB — LIPID PANEL
CHOL/HDL RATIO: 5.1 ratio
CHOLESTEROL: 197 mg/dL (ref 0–200)
HDL: 39 mg/dL — ABNORMAL LOW (ref >=46)
LDL Cholesterol: 127 mg/dL — ABNORMAL HIGH (ref 0–99)
Triglycerides: 156 mg/dL — ABNORMAL HIGH (ref <150)
VLDL: 31 mg/dL (ref 0–40)

## 2014-09-04 LAB — TSH: TSH: 1.314 u[IU]/mL (ref 0.350–4.500)

## 2014-09-04 NOTE — Progress Notes (Signed)
Patient ID: Colleen Peck, female   DOB: 28-Dec-1980, 34 y.o.   MRN: [id number] Subjective:     Colleen Peck is a 34 y.o. female and is here for a comprehensive physical exam. The patient reports problems - some decreased hearing (chronic) w/ ringing in ears increased over the last year. also keloids in her bikini area and on R breast from a bite.  She would like to have these injected with steroid.  Also, reports of vivid dreams..  History   Social History  . Marital Status: Single    Spouse Name: N/A  . Number of Children: N/A  . Years of Education: N/A   Occupational History  . Not on file.   Social History Main Topics  . Smoking status: Never Smoker   . Smokeless tobacco: Never Used  . Alcohol Use: 0.6 oz/week    1 Glasses of wine per week     Comment: past use of alcohol in '08-'09  . Drug Use: Yes    Special: Marijuana     Comment: past use of marijuana in '08-'09. occasional eats brownies w/ marijuana  . Sexual Activity: Not Currently    Birth Control/ Protection: Abstinence   Other Topics Concern  . 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   Health Maintenance  Topic Date Due  . HIV Screening  07/14/1997  . PAP SMEAR  10/13/2013  . INFLUENZA VACCINE  10/10/2014 (Originally 02/09/2014)  . TETANUS/TDAP  05/24/2023    The following portions of the patient's history were reviewed and updated as appropriate: allergies, current medications, past family history, past medical history, past social history, past surgical history and problem list.  Review of Systems Constitutional: negative Eyes: negative Ears, nose, mouth, throat, and face: positive for tinnitus Respiratory: positive for asthma Cardiovascular: negative Gastrointestinal: negative Genitourinary:negative Integument/breast: positive for skin lesion(s) and keloid r breast Hematologic/lymphatic:  negative Musculoskeletal:negative Neurological: negative Behavioral/Psych: positive for anxiety, depression and controlled with exercise and Prozac Endocrine: negative Allergic/Immunologic: negative   Objective:    BP 119/80 mmHg  Pulse 82  Temp(Src) 98.4 F (36.9 C) (Oral)  Ht 5\' 7"  (1.702 m)  Wt 255 lb (115.667 kg)  BMI 39.93 kg/m2  LMP 08/12/2014 General appearance: alert, cooperative, appears stated age, no distress and morbidly obese Head: Normocephalic, without obvious abnormality, atraumatic Eyes: conjunctivae/corneas clear. PERRL, EOM's intact. Fundi benign. Ears: normal TM's and external ear canals both ears Nose: Nares normal. Septum midline. Mucosa normal. No drainage or sinus tenderness. Throat: lips, mucosa, and tongue normal; teeth and gums normal Neck: no adenopathy, no carotid bruit, no JVD, supple, symmetrical, trachea midline and thyroid not enlarged, symmetric, no tenderness/mass/nodules Back: symmetric, no curvature. ROM normal. No CVA tenderness. Lungs: clear to auscultation bilaterally Breasts: normal appearance, no masses or tenderness, R breast with dime sized, flat hyperpigmentation, no lumps appreciated Heart: regular rate and rhythm, S1, S2 normal, no murmur, click, rub or gallop Abdomen: soft, non-tender; bowel sounds normal; no masses,  no organomegaly Pelvic: cervix normal in appearance, external genitalia normal, no adnexal masses or tenderness, no cervical motion tenderness, rectovaginal septum normal, uterus normal size, shape, and consistency and vagina normal without discharge Extremities: extremities normal, atraumatic, no cyanosis or edema Pulses: 2+ and symmetric Skin: Skin color, texture, turgor normal. No rashes or lesions Lymph nodes: Cervical, supraclavicular, and axillary nodes normal. Neurologic: Alert and oriented X 3, normal strength and tone.  Normal symmetric reflexes. Normal coordination and gait    Assessment:    Healthy female  exam.      Plan:     See After Visit Summary for Counseling Recommendations

## 2014-09-04 NOTE — Assessment & Plan Note (Signed)
Ear exam benign. CN 8 appears to be intact on exam, but patient with reported ringing in ears -Monitor for now -Consider referral to audiology

## 2014-09-04 NOTE — Assessment & Plan Note (Addendum)
Patient currently exercising 4-5x/week.  Began about 2 weeks ago.  Diet modifications, lowering carbohydrate intake -BMET, lipid today -Continue working on lifestyle modifications

## 2014-09-04 NOTE — Assessment & Plan Note (Signed)
Has had area on R breast and suprapubic area for years.  Area on R breast was previously treated with steroid injection, which helped soften and flatten it. -Referral to surgery for steroid injection.

## 2014-09-04 NOTE — Assessment & Plan Note (Addendum)
Pap smear and HIV testing today Counseled on diet and exercise

## 2014-09-04 NOTE — Patient Instructions (Addendum)
It was a pleasure seeing you today, Colleen Peck!  Information regarding what we discussed is included in this packet.  Please make an appointment to see me in 6 months for thyroid testing.  I will contact you will the results of your labs.  If anything is abnormal, I will call you.  Otherwise, expect a copy mailed to you.  Great job with your exercise and diet regimen!  Keep up the good work.  Surgical referral has been made for you for your keloids.  Please feel free to call our office at [phone] if any questions or concerns arise.  Warm Regards, Ashly M. Gottschalk, DO   A cardiac diet can help stop heart disease or a stroke from happening. It involves eating less unhealthy fats and eating more healthy fats.  FOODS TO AVOID OR LIMIT  Limit saturated fats. This type of fat is found in oils and dairy products, such as:  Coconut oil.  Palm oil.  Cocoa butter.  Butter.  Avoid trans-fat or hydrogenated oils. These are found in fried or pre-made baked goods, such as:  Margarine.  Pre-made cookies, cakes, and crackers.  Limit processed meats (hot dogs, deli meats, sausage) to 3 ounces a week.  Limit high-fat meats (marbled meats, fried chicken, or chicken with skin) to 3 ounces a week.  Limit salt (sodium) to 1500 milligrams a day.   Limit sweets and drinks with added sugar to no more than 5 servings a week. One serving is:  1 tablespoon of sugar.  1 tablespoon of jelly or jam.   cup sorbet.  1 cup lemonade.   cup regular soda. EAT MORE OF THE FOLLOWING FOODS Fruit  Eat 4to 5 servings a day. One serving of fruit is:  1 medium whole fruit.   cup dried fruit.   cup of fresh, frozen, or canned fruit.   cup 100% fruit juice. Vegetables  Eat 4 to 5 servings a day. One serving is:  1 cup raw leafy vegetables.   cup raw or cooked, cut-up vegetables.   cup vegetable juice. Whole Grains  Eat 3 servings a day (1 ounce equals 1 serving). Legumes  (such as beans, peas, and lentils)   Eat at least 4 servings a week ( cup equals 1 serving). Nuts and Seeds   Eat at least 4 servings a week ( cup equals 1 serving). Dietary Fiber  Eat 20 to 30 grams a day. Some foods high in dietary fiber include:  Dried beans.  Citrus fruits.  Apples, bananas.  Broccoli, Brussels sprouts, and eggplant.  Oats. Omega-3 Fats  Eat food with omega-3 fats. You can also take a dietary pill (supplement) that has 1 gram of DHA and EPA. Have 3.5 ounces of fatty fish a week, such as:  Salmon.  Mackerel.  Albacore tuna.  Sardines.  Lake trout.  Herring. PREPARING YOUR FOOD  Broil, bake, steam, or roast foods. Do not fry food. Do not cook food in butter (fat).  Use non-stick cooking sprays.  Remove skin from poultry, such as chicken and [place].  Remove fat from meat.  Take the fat off the top of stews, soups, and gravy.  Use lemon or herbs to flavor food instead of using butter or margarine.  Use nonfat yogurt, salsa, or low-fat dressings for salads. Document Released: 12/28/2011 Document Reviewed: 12/28/2011 [facility] Patient Information 2015 [place]. This information is not intended to replace advice given to you by your health care provider. Make sure you discuss  any questions you have with your health care provider.

## 2014-09-04 NOTE — Assessment & Plan Note (Signed)
Patient declines any symptoms of hyper or hypothyroidism.  Last TSH reviewed with patient. -TSH -Patient does not need refills at this time.

## 2014-09-04 NOTE — Assessment & Plan Note (Addendum)
Stable.  Patient with PHQ-2 of 0.  Denies SI/HI -Continue Prozac -Continue exercise daily

## 2014-09-05 ENCOUNTER — Encounter: Payer: Self-pay | Admitting: Family Medicine

## 2014-09-05 LAB — HIV ANTIBODY (ROUTINE TESTING W REFLEX): HIV 1&2 Ab, 4th Generation: NONREACTIVE

## 2014-09-05 LAB — CYTOLOGY - PAP

## 2014-09-09 ENCOUNTER — Telehealth: Payer: Self-pay | Admitting: Family Medicine

## 2014-09-09 NOTE — Telephone Encounter (Signed)
Called patient to advise her that she will need to call [employer] and have them correct her card before we can process her surgery referral. Her [employer] card currently has Dr. Dellie Catholic as her PCP and she will need to have one of our attending as her PCP. Once this has been corrected please have patient call our office so I can get her an appt.

## 2014-09-16 NOTE — Telephone Encounter (Signed)
Pt called. She has corrected the dr on her insurance card

## 2014-10-01 ENCOUNTER — Emergency Department (HOSPITAL_COMMUNITY)
Admission: EM | Admit: 2014-10-01 | Discharge: 2014-10-01 | Disposition: A | Discharge status: Home / Self Care | Payer: 59 | Attending: Emergency Medicine | Admitting: Emergency Medicine

## 2014-10-01 ENCOUNTER — Encounter (HOSPITAL_COMMUNITY): Payer: Self-pay

## 2014-10-01 DIAGNOSIS — Y9389 Activity, other specified: Secondary | ICD-10-CM | POA: Diagnosis not present

## 2014-10-01 DIAGNOSIS — F329 Major depressive disorder, single episode, unspecified: Secondary | ICD-10-CM | POA: Insufficient documentation

## 2014-10-01 DIAGNOSIS — Y92410 Unspecified street and highway as the place of occurrence of the external cause: Secondary | ICD-10-CM | POA: Diagnosis not present

## 2014-10-01 DIAGNOSIS — S46811A Strain of other muscles, fascia and tendons at shoulder and upper arm level, right arm, initial encounter: Secondary | ICD-10-CM

## 2014-10-01 DIAGNOSIS — S46911A Strain of unspecified muscle, fascia and tendon at shoulder and upper arm level, right arm, initial encounter: Secondary | ICD-10-CM | POA: Insufficient documentation

## 2014-10-01 DIAGNOSIS — Y998 Other external cause status: Secondary | ICD-10-CM | POA: Insufficient documentation

## 2014-10-01 DIAGNOSIS — E079 Disorder of thyroid, unspecified: Secondary | ICD-10-CM | POA: Insufficient documentation

## 2014-10-01 DIAGNOSIS — Z791 Long term (current) use of non-steroidal anti-inflammatories (NSAID): Secondary | ICD-10-CM | POA: Diagnosis not present

## 2014-10-01 DIAGNOSIS — J45909 Unspecified asthma, uncomplicated: Secondary | ICD-10-CM | POA: Insufficient documentation

## 2014-10-01 DIAGNOSIS — S4991XA Unspecified injury of right shoulder and upper arm, initial encounter: Secondary | ICD-10-CM | POA: Diagnosis present

## 2014-10-01 DIAGNOSIS — Z79899 Other long term (current) drug therapy: Secondary | ICD-10-CM | POA: Diagnosis not present

## 2014-10-01 DIAGNOSIS — Z7951 Long term (current) use of inhaled steroids: Secondary | ICD-10-CM | POA: Insufficient documentation

## 2014-10-01 MED ORDER — HYDROCODONE-ACETAMINOPHEN 5-325 MG PO TABS
2.0000 | ORAL_TABLET | Freq: Once | ORAL | Status: AC
  Administered 2014-10-01: 2 via ORAL
  Filled 2014-10-01: qty 2

## 2014-10-01 MED ORDER — HYDROCODONE-ACETAMINOPHEN 5-325 MG PO TABS: 1.0000 | ORAL_TABLET | ORAL | Status: DC | PRN

## 2014-10-01 NOTE — Discharge Instructions (Signed)
Take Vicodin as needed for pain. Take your muscle relaxer as needed with the pain medication. Refer to attached documents for more information.
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
## 2014-10-14 ENCOUNTER — Ambulatory Visit: Payer: Self-pay | Admitting: Family Medicine

## 2014-10-22 NOTE — Care Management Note (Signed)
    Page 1 of 1   10/22/2014     1:20:03 PM CARE MANAGEMENT NOTE 10/22/2014  Patient:  Colleen Peck, Colleen Peck   Account Number:  [id number]  Date Initiated:  10/22/2014  Documentation initiated by:  [facility]  Subjective/Objective Assessment:   adm: total left knee replacement     Action/Plan:   discharge planning   Anticipated DC Date:  10/22/2014   Anticipated DC Plan:  [place]  CM consult      Choice offered to / List presented to:             Status of service:   Medicare Important Message given?   (If response is "NO", the following Medicare IM given date fields will be blank) Date Medicare IM given:   Medicare IM given by:   Date Additional Medicare IM given:   Additional Medicare IM given by:    Discharge Disposition:  [facility]  Per UR Regulation:    If discussed at Long Length of Stay Meetings, dates discussed:    Comments:  10/22/14 13:00 Cm notes pt to go to SNF [facility]) for rehab; CSW arranged.  No other CM needs were communicated.  Mariane Masters, BSN, CM [phone].

## 2014-10-27 ENCOUNTER — Telehealth: Payer: Self-pay | Admitting: Family Medicine

## 2014-10-27 NOTE — Telephone Encounter (Signed)
Emergency Line Call  Pt reports she hit her left hand between the pinky and ring finger on the wall about 6 days ago and felt a pop. She reports swelling and pain but no redness and no broken skin. She is able to make a fist and is able to feel in both . She has taken Tylenol and ibuprofen with some help, but now has had some burning pain in her hand that has been worsening. She does not think she broke anything.  Advised pt that she can go to Urgent Care today if she wishes, or she can come into clinic tomorrow for a same-day appointment. Advised her that if she comes into clinic tomorrow and the doctor wants to get an xray, this would be done as an outpt at the hospital; pt asked if Urgent Care would be quicker and I counseled her that they could do an xray there if they thought it was necessary, but I can't tell without seeing her if she even needs an xray. Pt states she will likely proceed to Urgent Care, today. Advised her to f/u as needed, otherwise.  Of note, pt was laughing very loud at the beginning of the conversation and there was significant background chatter (which pt participated in through the call).  Emmaline Kluver, MD PGY-3, [facility] 10/27/2014, 1:17 PM

## 2014-10-28 ENCOUNTER — Emergency Department (INDEPENDENT_AMBULATORY_CARE_PROVIDER_SITE_OTHER)
Admission: EM | Admit: 2014-10-28 | Discharge: 2014-10-28 | Disposition: A | Discharge status: Home / Self Care | Payer: 59 | Source: Home / Self Care | Attending: Emergency Medicine | Admitting: Emergency Medicine

## 2014-10-28 ENCOUNTER — Emergency Department (INDEPENDENT_AMBULATORY_CARE_PROVIDER_SITE_OTHER): Payer: 59

## 2014-10-28 ENCOUNTER — Encounter (HOSPITAL_COMMUNITY): Payer: Self-pay | Admitting: Emergency Medicine

## 2014-10-28 DIAGNOSIS — S6392XA Sprain of unspecified part of left wrist and hand, initial encounter: Secondary | ICD-10-CM

## 2014-10-28 NOTE — ED Provider Notes (Signed)
CSN: [id number]     Arrival date & time 10/28/14  1359 History   First MD Initiated Contact with Patient 10/28/14 1559     Chief Complaint  Patient presents with  . Hand Pain   (Consider location/radiation/quality/duration/timing/severity/associated sxs/prior Treatment) HPI  She is a 34 year old woman here for evaluation of left hand pain. She states she hit her left ulnar hand on the corner of a wall in her home last week. She has some pain and swelling. She put an ice pack on there. It has not gotten any better. She reports pain particularly with flexion of her pinky finger. She cannot fully flex the fifth MTP joint. The pain is worse when she tries to grip something with her little finger.  Past Medical History  Diagnosis Date  . Asthma   . Seasonal allergies   . Depression   . PTSD (post-traumatic stress disorder)   . Thyroid disease 2009    Graves disease (pt reported resolved); hypothyriodism  . Abnormal pap     pt reports abnl pap many years ago.  Nl since then.  . Palpitations 03/12/2008   Past Surgical History  Procedure Laterality Date  . Dilation and curettage of uterus  March 2006   Family History  Problem Relation Age of Onset  . Drug abuse Father   . Depression Maternal Aunt   . Depression Maternal Grandmother   . Anxiety disorder Maternal Grandmother   . COPD Maternal Grandmother   . Suicidality Cousin   . Depression Cousin   . Depression Maternal Aunt   . Hypertension Mother   . Diabetes Paternal Grandfather   . COPD Paternal Grandmother   . Heart disease Neg Hx    History  Substance Use Topics  . Smoking status: Never Smoker   . Smokeless tobacco: Never Used  . Alcohol Use: 0.6 oz/week    1 Glasses of wine per week     Comment: past use of alcohol in '08-'09   OB History    No data available     Review of Systems As in history of present illness Allergies  Review of patient's allergies indicates no known allergies.  Home Medications   Prior  to Admission medications   Medication Sig Start Date End Date Taking? Authorizing Provider  albuterol (PROVENTIL HFA) 108 (90 BASE) MCG/ACT inhaler Inhale 2 puffs into the lungs every 6 (six) hours as needed. For shortness of breath, wheezing. 05/08/14   Bernadene Bell, MD  albuterol (PROVENTIL) (2.5 MG/3ML) 0.083% nebulizer solution Take 3 mLs (2.5 mg total) by nebulization every 6 (six) hours as needed for shortness of breath. For shortness of breath 05/08/14   Bernadene Bell, MD  beclomethasone (QVAR) 40 MCG/ACT inhaler Inhale 1 puff into the lungs 2 (two) times daily. 05/08/14   Bernadene Bell, MD  diclofenac (VOLTAREN) 75 MG EC tablet Take 1 tablet (75 mg total) by mouth 2 (two) times daily. 07/18/14   Waldemar Dickens, MD  FLUoxetine (PROZAC) 20 MG capsule Take 1 capsule (20 mg total) by mouth daily. Patient taking differently: Take 40 mg by mouth daily.  05/13/14   Bernadene Bell, MD  fluticasone (FLONASE) 50 MCG/ACT nasal spray Place 2 sprays into both nostrils daily. 05/08/14   Bernadene Bell, MD  HYDROcodone-acetaminophen (NORCO/VICODIN) 5-325 MG per tablet Take 1-2 tablets by mouth every 4 (four) hours as needed. 10/01/14   Alvina Chou, PA-C  levothyroxine (SYNTHROID, LEVOTHROID) 112 MCG tablet TAKE TWO TABLETS BY MOUTH  ONCE DAILY BEFORE BREAKFAST 05/23/14   Janora Norlander, DO  methocarbamol (ROBAXIN) 500 MG tablet Take 1-2 tablets (500-1,000 mg total) by mouth every 6 (six) hours as needed for muscle spasms. Patient not taking: Reported on 08/03/2014 07/18/14   Waldemar Dickens, MD  Multiple Vitamins-Minerals (HM MULTIVITAMIN ADULT GUMMY PO) Take 2 capsules by mouth daily.    Historical Provider, MD  PredniSONE 10 MG KIT 12 day dose pack Patient not taking: Reported on 09/04/2014 07/18/14   Waldemar Dickens, MD  traZODone (DESYREL) 50 MG tablet Take 0.5-1 tablets (25-50 mg total) by mouth at bedtime as needed for sleep. Patient not taking: Reported on 09/04/2014 03/20/14   Bernadene Bell, MD   BP 136/87 mmHg  Pulse 78  Temp(Src) 98.4 F (36.9 C) (Oral)  Resp 16  SpO2 98%  LMP 10/09/2014 Physical Exam  Constitutional: She is oriented to person, place, and time. She appears well-developed and well-nourished. No distress.  Cardiovascular: Normal rate.   Pulmonary/Chest: Effort normal.  Musculoskeletal:  Left hand: Mild swelling over the ulnar dorsal hand. No tenderness over the fourth or fifth metatarsals. She is tender between these bones. She has pain with active and passive flexion and extension of the little finger. Brisk cap refill in fourth and fifth digits.  Neurological: She is alert and oriented to person, place, and time.    ED Course  Procedures (including critical care time) Labs Review Labs Reviewed - No data to display  Imaging Review Dg Hand Complete Left  10/28/2014   CLINICAL DATA:  Left fourth metacarpal pain, hit hand on wall 1 week ago  EXAM: LEFT HAND - COMPLETE 3+ VIEW  COMPARISON:  None.  FINDINGS: Three views of left hand submitted. No acute fracture or subluxation. No radiopaque foreign body.  IMPRESSION: Negative.   Electronically Signed   By: Lahoma Crocker M.D.   On: 10/28/2014 16:55     MDM   1. Hand sprain, left, initial encounter    No fracture. Will place a splint on pinkie finger as this is the most painful. Recommended ibuprofen 800 mg 3 times a day as needed. Frequent icing. Follow-up with PCP if no improvement in the next week.    Melony Overly, MD 10/28/14 [phone]

## 2014-10-28 NOTE — ED Notes (Signed)
Patient reports hitting left hand on corner of wall, has had pain in left hand since then.  Patient continues to have soreness and feels mobility is decreasing

## 2014-10-28 NOTE — Discharge Instructions (Signed)
You have sprained your hand. Wear the brace for the next 4 days. Take ibuprofen 800 mg 3 times a day as needed. Apply ice as often as you can. Follow-up with your PCP if no improvement by the end of the week.

## 2014-12-24 ENCOUNTER — Ambulatory Visit (INDEPENDENT_AMBULATORY_CARE_PROVIDER_SITE_OTHER): Payer: 59 | Admitting: Family Medicine

## 2014-12-24 ENCOUNTER — Encounter: Payer: Self-pay | Admitting: Family Medicine

## 2014-12-24 VITALS — BP 122/71 | HR 82 | Temp 98.5°F | Ht 67.0 in | Wt 243.6 lb

## 2014-12-24 DIAGNOSIS — E039 Hypothyroidism, unspecified: Secondary | ICD-10-CM

## 2014-12-24 DIAGNOSIS — R5383 Other fatigue: Secondary | ICD-10-CM | POA: Diagnosis not present

## 2014-12-24 DIAGNOSIS — E669 Obesity, unspecified: Secondary | ICD-10-CM

## 2014-12-24 LAB — CBC
HEMATOCRIT: 32.7 % — AB (ref 36.0–46.0)
HEMOGLOBIN: 10.6 g/dL — AB (ref 12.0–15.0)
MCH: 25.7 pg — AB (ref 26.0–34.0)
MCHC: 32.4 g/dL (ref 30.0–36.0)
MCV: 79.2 fL (ref 78.0–100.0)
MPV: 10.7 fL (ref 8.6–12.4)
Platelets: 293 10*3/uL (ref 150–400)
RBC: 4.13 MIL/uL (ref 3.87–5.11)
RDW: 14.3 % (ref 11.5–15.5)
WBC: 4.7 10*3/uL (ref 4.0–10.5)

## 2014-12-24 LAB — TSH: TSH: 0.244 u[IU]/mL — ABNORMAL LOW (ref 0.350–4.500)

## 2014-12-24 NOTE — Assessment & Plan Note (Addendum)
Fatigued. -TSH today -Continue current dose of Synthroid in the interim, will adjust as appropriate -Will also check CBC.  Patient with h/o anemia -will call with results.   -Consider sleep study -return precautions reviewed.

## 2014-12-24 NOTE — Patient Instructions (Signed)
It was a pleasure seeing you today, Ms Orengo!  Information regarding what we discussed is included in this packet.  Buy and elbow brace/band to use at nighttime.  I will contact you will the results of your labs.  If anything is abnormal, I will call you.  Otherwise, expect a copy to be mailed to you.  Please feel free to call our office at [phone] if any questions or concerns arise.  Warm Regards, Coston Mandato M. Caton Popowski, DO Medial Epicondylitis (Golfer's Elbow) with Rehab Medial epicondylitis involves inflammation and pain around the inner (medial) portion of the elbow. This pain is caused by inflammation of the tendons in the forearm that flex (bring down) the wrist. Medial epicondylitis is also called golfer's elbow, because it is common among golfers. However, it may occur in any individual who flexes the wrist regularly. If medial epicondylitis is left untreated, it may become a chronic problem. SYMPTOMS   Pain, tenderness, or inflammation over the inner (medial) side of the elbow.  Pain or weakness with gripping activities.  Pain that increases with wrist twisting motions (using a screwdriver, playing golf, bowling). CAUSES  Medial epicondylitis is caused by inflammation of the tendons that flex the wrist. Causes of injury may include:  Chronic, repetitive stress and strain to the tendons that run from the wrist and forearm to the elbow.  Sudden strain on the forearm, including wrist snap when serving balls with racquet sports, or throwing a baseball. RISK INCREASES WITH:  Sports or occupations that require repetitive and/or strenuous forearm and wrist movements (pitching a baseball, golfing, carpentry).  Poor wrist and forearm strength and flexibility.  Failure to warm up properly before activity.  Resuming activity before healing, rehabilitation, and conditioning are complete. PREVENTION   Warm up and stretch properly before activity.  Maintain physical  fitness:  Strength, flexibility, and endurance.  Cardiovascular fitness.  Wear and use properly fitted equipment.  Learn and use proper technique and have a coach correct improper technique.  Wear a tennis elbow (counterforce) brace. PROGNOSIS  The course of this condition depends on the degree of the injury. If treated properly, acute cases (symptoms lasting less than 4 weeks) are often resolved in 2 to 6 weeks. Chronic (longer lasting cases) often resolve in 3 to 6 months, but may require physical therapy. RELATED COMPLICATIONS   Frequently recurring symptoms, resulting in a chronic problem. Properly treating the problem the first time decreases frequency of recurrence.  Chronic inflammation, scarring, and partial tendon tear, requiring surgery.  Delayed healing or resolution of symptoms. TREATMENT  Treatment first involves the use of ice and medicine, to reduce pain and inflammation. Strengthening and stretching exercises may reduce discomfort, if performed regularly. These exercises may be performed at home, if the condition is an acute injury. Chronic cases may require a referral to a physical therapist for evaluation and treatment. Your caregiver may advise a corticosteroid injection to help reduce inflammation. Rarely, surgery is needed. MEDICATION  If pain medicine is needed, nonsteroidal anti-inflammatory medicines (aspirin and ibuprofen), or other minor pain relievers (acetaminophen), are often advised.  Do not take pain medicine for 7 days before surgery.  Prescription pain relievers may be given, if your caregiver thinks they are needed. Use only as directed and only as much as you need.  Corticosteroid injections may be recommended. These injections should be reserved only for the most severe cases, because they can only be given a certain number of times. HEAT AND COLD  Cold treatment (icing)  should be applied for 10 to 15 minutes every 2 to 3 hours for inflammation and  pain, and immediately after activity that aggravates your symptoms. Use ice packs or an ice massage.  Heat treatment may be used before performing stretching and strengthening activities prescribed by your caregiver, physical therapist, or athletic trainer. Use a heat pack or a warm water soak. SEEK MEDICAL CARE IF: Symptoms get worse or do not improve in 2 weeks, despite treatment. EXERCISES  RANGE OF MOTION (ROM) AND STRETCHING EXERCISES - Epicondylitis, Medial (Golfer's Elbow) These exercises may help you when beginning to rehabilitate your injury. Your symptoms may go away with or without further involvement from your physician, physical therapist or athletic trainer. While completing these exercises, remember:   Restoring tissue flexibility helps normal motion to return to the joints. This allows healthier, less painful movement and activity.  An effective stretch should be held for at least 30 seconds.  A stretch should never be painful. You should only feel a gentle lengthening or release in the stretched tissue. RANGE OF MOTION - Wrist Flexion, Active-Assisted  Extend your right / left elbow with your fingers pointing down.*  Gently pull the back of your hand towards you, until you feel a gentle stretch on the top of your forearm.  Hold this position for __________ seconds. Repeat __________ times. Complete this exercise __________ times per day.  *If directed by your physician, physical therapist or athletic trainer, complete this stretch with your elbow bent, rather than extended. RANGE OF MOTION - Wrist Extension, Active-Assisted  Extend your right / left elbow and turn your palm upwards.*  Gently pull your palm and fingertips back, so your wrist extends and your fingers point more toward the ground.  You should feel a gentle stretch on the inside of your forearm.  Hold this position for __________ seconds. Repeat __________ times. Complete this exercise __________ times  per day. *If directed by your physician, physical therapist or athletic trainer, complete this stretch with your elbow bent, rather than extended. STRETCH - Wrist Extension   Place your right / left fingertips on a tabletop leaving your elbow slightly bent. Your fingers should point backwards.  Gently press your fingers and palm down onto the table, by straightening your elbow. You should feel a stretch on the inside of your forearm.  Hold this position for __________ seconds. Repeat __________ times. Complete this stretch __________ times per day.  STRENGTHENING EXERCISES - Epicondylitis, Medial (Golfer's Elbow) These exercises may help you when beginning to rehabilitate your injury. They may resolve your symptoms with or without further involvement from your physician, physical therapist or athletic trainer. While completing these exercises, remember:   Muscles can gain both the endurance and the strength needed for everyday activities through controlled exercises.  Complete these exercises as instructed by your physician, physical therapist or athletic trainer. Increase the resistance and repetitions only as guided.  You may experience muscle soreness or fatigue, but the pain or discomfort you are trying to eliminate should never worsen during these exercises. If this pain does get worse, stop and make sure you are following the directions exactly. If the pain is still present after adjustments, discontinue the exercise until you can discuss the trouble with your caregiver. STRENGTH - Wrist Flexors  Sit with your right / left forearm palm-up, and fully supported on a table or countertop. Your elbow should be resting below the height of your shoulder. Allow your wrist to extend over the edge of  the surface.  Loosely holding a __________ weight, or a piece of rubber exercise band or tubing, slowly curl your hand up toward your forearm.  Hold this position for __________ seconds. Slowly lower  the wrist back to the starting position in a controlled manner. Repeat __________ times. Complete this exercise __________ times per day.  STRENGTH - Wrist Extensors  Sit with your right / left forearm palm-down and fully supported. Your elbow should be resting below the height of your shoulder. Allow your wrist to extend over the edge of the surface.  Loosely holding a __________ weight, or a piece of rubber exercise band or tubing, slowly curl your hand up toward your forearm.  Hold this position for __________ seconds. Slowly lower the wrist back to the starting position in a controlled manner. Repeat __________ times. Complete this exercise __________ times per day.  STRENGTH - Ulnar Deviators  Stand with a ____________________ weight in your right / left hand, or sit while holding a rubber exercise band or tubing, with your healthy arm supported on a table or countertop.  Move your wrist so that your pinkie travels toward your forearm and your thumb moves away from your forearm.  Hold this position for __________ seconds and then slowly lower the wrist back to the starting position. Repeat __________ times. Complete this exercise __________ times per day STRENGTH - Grip   Grasp a tennis ball, a dense sponge, or a large, rolled sock in your hand.  Squeeze as hard as you can, without increasing any pain.  Hold this position for __________ seconds. Release your grip slowly. Repeat __________ times. Complete this exercise __________ times per day.  STRENGTH - Forearm Supinators   Sit with your right / left forearm supported on a table, keeping your elbow below shoulder height. Rest your hand over the edge, palm down.  Gently grip a hammer or a soup ladle.  Without moving your elbow, slowly turn your palm and hand upward to a "thumbs-up" position.  Hold this position for __________ seconds. Slowly return to the starting position. Repeat __________ times. Complete this exercise  __________ times per day.  STRENGTH - Forearm Pronators  Sit with your right / left forearm supported on a table, keeping your elbow below shoulder height. Rest your hand over the edge, palm up.  Gently grip a hammer or a soup ladle.  Without moving your elbow, slowly turn your palm and hand upward to a "thumbs-up" position.  Hold this position for __________ seconds. Slowly return to the starting position. Repeat __________ times. Complete this exercise __________ times per day.  Document Released: 06/28/2005 Document Revised: 09/20/2011 Document Reviewed: 10/10/2008 [facility] Patient Information 2015 [place], [place]. This information is not intended to replace advice given to you by your health care provider. Make sure you discuss any questions you have with your health care provider.
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
## 2014-12-24 NOTE — Assessment & Plan Note (Signed)
Patient doing well.  Weight down about 12lbs since last visit -Continue diet and exercise with goal no more than 2lbs/week weight loss. -Will continue to monitor

## 2014-12-26 ENCOUNTER — Other Ambulatory Visit: Payer: Self-pay | Admitting: Family Medicine

## 2014-12-26 DIAGNOSIS — E039 Hypothyroidism, unspecified: Secondary | ICD-10-CM

## 2014-12-26 MED ORDER — LEVOTHYROXINE SODIUM 200 MCG PO TABS: 200.0000 ug | ORAL_TABLET | Freq: Every day | ORAL | Status: DC

## 2014-12-26 NOTE — Progress Notes (Signed)
Discussed TSH result with patient.  Will decrease Synthroid to 236mcg from 231mcg and recheck TSH in 3 months or sooner if symptoms worse.  TSH 12/24/14: 0.244 Hemoglobin & Hematocrit     Component Value Date/Time   HGB 10.6* 12/24/2014 1432   HCT 32.7* 12/24/2014 1432    Ashly M. Lajuana Ripple, DO PGY-1, [place]

## 2015-01-03 ENCOUNTER — Encounter (HOSPITAL_COMMUNITY): Payer: Self-pay | Admitting: Emergency Medicine

## 2015-01-03 ENCOUNTER — Emergency Department (INDEPENDENT_AMBULATORY_CARE_PROVIDER_SITE_OTHER)
Admission: EM | Admit: 2015-01-03 | Discharge: 2015-01-03 | Disposition: A | Discharge status: Home / Self Care | Payer: 59 | Source: Home / Self Care | Attending: Family Medicine | Admitting: Family Medicine

## 2015-01-03 DIAGNOSIS — R42 Dizziness and giddiness: Secondary | ICD-10-CM

## 2015-01-03 NOTE — ED Provider Notes (Signed)
Colleen Peck is a 34 y.o. female who presents to Urgent Care today for lightheadedness. Patient developed lightheadedness today after being outside in the heat. She denies any vertigo fevers chills nausea vomiting or diarrhea currently. She vomited a few days ago but none today. No abdominal pain chest pains or palpitations. She feels well otherwise. No treatment tried yet.   Past Medical History  Diagnosis Date  . Asthma   . Seasonal allergies   . Depression   . PTSD (post-traumatic stress disorder)   . Thyroid disease 2009    Graves disease (pt reported resolved); hypothyriodism  . Abnormal pap     pt reports abnl pap many years ago.  Nl since then.  . Palpitations 03/12/2008   Past Surgical History  Procedure Laterality Date  . Dilation and curettage of uterus  March 2006   History  Substance Use Topics  . Smoking status: Never Smoker   . Smokeless tobacco: Never Used  . Alcohol Use: 0.6 oz/week    1 Glasses of wine per week     Comment: past use of alcohol in '08-'09   ROS as above Medications: No current facility-administered medications for this encounter.   Current Outpatient Prescriptions  Medication Sig Dispense Refill  . albuterol (PROVENTIL HFA) 108 (90 BASE) MCG/ACT inhaler Inhale 2 puffs into the lungs every 6 (six) hours as needed. For shortness of breath, wheezing. 1 Inhaler 1  . albuterol (PROVENTIL) (2.5 MG/3ML) 0.083% nebulizer solution Take 3 mLs (2.5 mg total) by nebulization every 6 (six) hours as needed for shortness of breath. For shortness of breath 25 mL 1  . beclomethasone (QVAR) 40 MCG/ACT inhaler Inhale 1 puff into the lungs 2 (two) times daily. 1 Inhaler 12  . diclofenac (VOLTAREN) 75 MG EC tablet Take 1 tablet (75 mg total) by mouth 2 (two) times daily. 60 tablet 0  . FLUoxetine (PROZAC) 20 MG capsule Take 1 capsule (20 mg total) by mouth daily. (Patient taking differently: Take 40 mg by mouth daily. ) 30 capsule 3  . fluticasone (FLONASE) 50  MCG/ACT nasal spray Place 2 sprays into both nostrils daily. 16 g 6  . HYDROcodone-acetaminophen (NORCO/VICODIN) 5-325 MG per tablet Take 1-2 tablets by mouth every 4 (four) hours as needed. 15 tablet 0  . levothyroxine (SYNTHROID, LEVOTHROID) 200 MCG tablet Take 1 tablet (200 mcg total) by mouth daily. 30 tablet 2  . methocarbamol (ROBAXIN) 500 MG tablet Take 1-2 tablets (500-1,000 mg total) by mouth every 6 (six) hours as needed for muscle spasms. (Patient not taking: Reported on 08/03/2014) 60 tablet 0  . Multiple Vitamins-Minerals (HM MULTIVITAMIN ADULT GUMMY PO) Take 2 capsules by mouth daily.    . PredniSONE 10 MG KIT 12 day dose pack (Patient not taking: Reported on 09/04/2014) 1 kit 0  . traZODone (DESYREL) 50 MG tablet Take 0.5-1 tablets (25-50 mg total) by mouth at bedtime as needed for sleep. (Patient not taking: Reported on 09/04/2014) 30 tablet 0  . [DISCONTINUED] benztropine (COGENTIN) 1 MG tablet Take 0.5 mg by mouth daily.     No Known Allergies   Exam:  BP 118/62 mmHg  Pulse 78  Temp(Src) 98.5 F (36.9 C) (Oral)  Resp 16  SpO2 98%  LMP 12/06/2014  Orthostatic VS for the past 24 hrs:  BP- Lying Pulse- Lying BP- Sitting Pulse- Sitting BP- Standing at 0 minutes Pulse- Standing at 0 minutes  01/03/15 1755 109/72 mmHg 62 115/82 mmHg 65 109/71 mmHg 69     Gen:  Well NAD HEENT: EOMI,  MMM PERRLA Lungs: Normal work of breathing. CTABL Heart: RRR no MRG Abd: NABS, Soft. Nondistended, Nontender Exts: Brisk capillary refill, warm and well perfused.  Neuro alert and oriented normal coordination balance and gait.  ED ECG REPORT   Date: 01/03/2015  Rate: 62  Rhythm: normal sinus rhythm  QRS Axis: normal  Intervals: normal  ST/T Wave abnormalities: normal  Conduction Disutrbances:none  Narrative Interpretation:   Old EKG Reviewed: changes noted and improved from EKG in 2012. No ST segment changes.   I have personally reviewed the EKG tracing and agree with the computerized  printout as noted.   No results found for this or any previous visit (from the past 24 hour(s)). No results found.  Assessment and Plan: 34 y.o. female with lightheadedness. Likely related to heat. No significant orthostatic changes. Plan for watchful waiting and follow-up with PCP.  Discussed warning signs or symptoms. Please see discharge instructions. Patient expresses understanding.     Gregor Hams, MD 01/03/15 [phone]

## 2015-01-03 NOTE — ED Notes (Signed)
See physicians note Pt reports feeling dizzy, light headed and nauseas onset this am Alert, no signs of acute distress.

## 2015-01-03 NOTE — Discharge Instructions (Signed)
Thank you for coming in today. Call or go to the emergency room if you get worse, have trouble breathing, have chest pains, or palpitations.  Take it easy.  Take tylenol as needed.    Dizziness Dizziness is a common problem. It is a feeling of unsteadiness or light-headedness. You may feel like you are about to faint. Dizziness can lead to injury if you stumble or fall. A person of any age group can suffer from dizziness, but dizziness is more common in older adults. CAUSES  Dizziness can be caused by many different things, including:  Middle ear problems.  Standing for too long.  Infections.  An allergic reaction.  Aging.  An emotional response to something, such as the sight of blood.  Side effects of medicines.  Tiredness.  Problems with circulation or blood pressure.  Excessive use of alcohol or medicines, or illegal drug use.  Breathing too fast (hyperventilation).  An irregular heart rhythm (arrhythmia).  A low red blood cell count (anemia).  Pregnancy.  Vomiting, diarrhea, fever, or other illnesses that cause body fluid loss (dehydration).  Diseases or conditions such as Parkinson's disease, high blood pressure (hypertension), diabetes, and thyroid problems.  Exposure to extreme heat. DIAGNOSIS  Your health care provider will ask about your symptoms, perform a physical exam, and perform an electrocardiogram (ECG) to record the electrical activity of your heart. Your health care provider may also perform other heart or blood tests to determine the cause of your dizziness. These may include:  Transthoracic echocardiogram (TTE). During echocardiography, sound waves are used to evaluate how blood flows through your heart.  Transesophageal echocardiogram (TEE).  Cardiac monitoring. This allows your health care provider to monitor your heart rate and rhythm in real time.  Holter monitor. This is a portable device that records your heartbeat and can help diagnose  heart arrhythmias. It allows your health care provider to track your heart activity for several days if needed.  Stress tests by exercise or by giving medicine that makes the heart beat faster. TREATMENT  Treatment of dizziness depends on the cause of your symptoms and can vary greatly. HOME CARE INSTRUCTIONS   Drink enough fluids to keep your urine clear or pale yellow. This is especially important in very hot weather. In older adults, it is also important in cold weather.  Take your medicine exactly as directed if your dizziness is caused by medicines. When taking blood pressure medicines, it is especially important to get up slowly.  Rise slowly from chairs and steady yourself until you feel okay.  In the morning, first sit up on the side of the bed. When you feel okay, stand slowly while holding onto something until you know your balance is fine.  Move your legs often if you need to stand in one place for a long time. Tighten and relax your muscles in your legs while standing.  Have someone stay with you for 1-2 days if dizziness continues to be a problem. Do this until you feel you are well enough to stay alone. Have the person call your health care provider if he or she notices changes in you that are concerning.  Do not drive or use heavy machinery if you feel dizzy.  Do not drink alcohol. SEEK IMMEDIATE MEDICAL CARE IF:   Your dizziness or light-headedness gets worse.  You feel nauseous or vomit.  You have problems talking, walking, or using your arms, hands, or legs.  You feel weak.  You are not thinking  clearly or you have trouble forming sentences. It may take a friend or family member to notice this.  You have chest pain, abdominal pain, shortness of breath, or sweating.  Your vision changes.  You notice any bleeding.  You have side effects from medicine that seems to be getting worse rather than better. MAKE SURE YOU:   Understand these instructions.  Will  watch your condition.  Will get help right away if you are not doing well or get worse. Document Released: 12/22/2000 Document Revised: 07/03/2013 Document Reviewed: 01/15/2011 [facility] Patient Information 2015 [place], [place]. This information is not intended to replace advice given to you by your health care provider. Make sure you discuss any questions you have with your health care provider.

## 2015-01-10 ENCOUNTER — Ambulatory Visit: Payer: Self-pay | Admitting: Family Medicine

## 2015-01-20 ENCOUNTER — Encounter: Payer: Self-pay | Admitting: Family Medicine

## 2015-01-20 ENCOUNTER — Ambulatory Visit (INDEPENDENT_AMBULATORY_CARE_PROVIDER_SITE_OTHER): Payer: 59 | Admitting: Family Medicine

## 2015-01-20 VITALS — BP 112/68 | HR 74 | Temp 98.6°F | Ht 67.0 in | Wt 245.0 lb

## 2015-01-20 DIAGNOSIS — F418 Other specified anxiety disorders: Secondary | ICD-10-CM | POA: Diagnosis not present

## 2015-01-20 MED ORDER — TRAZODONE HCL 100 MG PO TABS: 50.0000 mg | ORAL_TABLET | Freq: Every evening | ORAL | Status: DC | PRN

## 2015-01-20 NOTE — Progress Notes (Signed)
    Subjective    Colleen Peck is a 34 y.o. female that presents for a follow-up visit for chronic issues.   1. Anxiety: Anxiousness throughout the day. She is also having issues sleeping, which she feels is making her anxiety worse. Trazadone helps with insomnia but she takes a muscle relaxer in addition. She participates in early childhood education, [employer], graduate school, a part-time job (from full time). Prozac has been helping with her anxiety. She currently takes Prozac 40mg  daily. She drinks wine about twice per week. She has decreased the amount of caffeine she intakes which has helped. She generally takes Trazadone around 10, lies in bed whenever she gets sleepy and generally goes to bed around 1am.  GAD: 15  History  Substance Use Topics  . Smoking status: Never Smoker   . Smokeless tobacco: Never Used  . Alcohol Use: 0.6 oz/week    1 Glasses of wine per week     Comment: past use of alcohol in '08-'09    No Known Allergies  No orders of the defined types were placed in this encounter.    ROS  Per HPI   Objective   BP 112/68 mmHg  Pulse 74  Temp(Src) 98.6 F (37 C) (Oral)  Ht 5\' 7"  (1.702 m)  Wt 245 lb (111.131 kg)  BMI 38.36 kg/m2  LMP 12/06/2014  General: Well appearing, no distress  Assessment and Plan   Please refer to problem based charting of assessment and plan

## 2015-01-20 NOTE — Patient Instructions (Signed)
Thank you for coming to see me today. It was a pleasure. Today we talked about:   Anxiety: You seem to have a lot on your plate. I think this may be the main reason you are having issues throughout your day of anxiety (most likely related to stress). Your sleeping habits probably aren't helping. We discussed increasing your Trazadone to 100mg  at night, however, if this causes too much drowsiness in the day, please cut back down to 50mg . We also discussed trying to minimize your stress levels during the day by taking on fewer responsibilities. Your Thyroid hormone medication has been adjusted and this can be rechecked in 2-4 weeks as this may also be contributing to symptoms.  Please make an appointment to see Dr. Lajuana Ripple for follow-up  If you have any questions or concerns, please do not hesitate to call the office at [phone].  Sincerely,  Cordelia Poche, MD

## 2015-01-21 NOTE — Assessment & Plan Note (Signed)
Anxiety possibly highly linked to stress. Patient has a lot on her plate and even though this is normal for her, "normal" appears to be taking a toll on her. Sleep may be contributing so will increase Trazadone to 100mg  qhs with instructions to back off if has significant daytime drowsiness. Patient asking about "anxiety pills" and discussed that this would most likely not be a good choice for what appears to be stress related anxiety. Discussed modification of possible stressors including cutting back on the work she is doing and responsibilities she has taken. No increase to Prozac today but may require increase of current intervention does not improve symptoms. Discussed proper sleep hygiene briefly. Patient to follow-up with PCP.

## 2015-01-24 ENCOUNTER — Other Ambulatory Visit: Payer: Self-pay | Admitting: Family Medicine

## 2015-01-27 ENCOUNTER — Telehealth: Payer: Self-pay | Admitting: *Deleted

## 2015-01-27 NOTE — Telephone Encounter (Signed)
Pt called stating she has not slept in 24 hours.  Pt did not take the trazodone last night because it was to late to take it. Pt stated she was not going to work because of lack of sleep.  Pt is requesting a return call from PCP.  Pt is waiting to be seen by her PCP later this week. She stated she only feels comfortable in talking with her provider.  Derl Barrow, RN

## 2015-01-27 NOTE — Telephone Encounter (Signed)
Spoke to patient on the phone.   She is reporting that she is feeling like she has tons of energy.  She is planning on taking the Trazodone and going to sleep.  Instructed her to follow up with me this week if she is still having difficulty sleeping in spite of taking trazodone.  May need to recheck thyroid, as TSH was elevated at last appointment.

## 2015-01-29 ENCOUNTER — Telehealth: Payer: Self-pay | Admitting: Family Medicine

## 2015-01-29 NOTE — Telephone Encounter (Signed)
Pt called to make an appointment with Dr. Lajuana Ripple for Thursday 7/21 but the doctor is full and she doesn't want to see anyone else. The doctor does have openings on 7/22 but she didn't want to come them only on Thursday. Please let the patient know if the doctor will allow [place] to double book and then I can schedule her. jw

## 2015-01-30 ENCOUNTER — Encounter: Payer: Self-pay | Admitting: Family Medicine

## 2015-01-30 ENCOUNTER — Ambulatory Visit (INDEPENDENT_AMBULATORY_CARE_PROVIDER_SITE_OTHER): Payer: 59 | Admitting: Family Medicine

## 2015-01-30 ENCOUNTER — Telehealth: Payer: Self-pay

## 2015-01-30 VITALS — BP 102/64 | HR 91 | Temp 99.2°F | Ht 67.0 in | Wt 244.6 lb

## 2015-01-30 DIAGNOSIS — D649 Anemia, unspecified: Secondary | ICD-10-CM | POA: Diagnosis not present

## 2015-01-30 DIAGNOSIS — E039 Hypothyroidism, unspecified: Secondary | ICD-10-CM | POA: Diagnosis not present

## 2015-01-30 DIAGNOSIS — G479 Sleep disorder, unspecified: Secondary | ICD-10-CM

## 2015-01-30 LAB — T4, FREE: Free T4: 0.95 ng/dL (ref 0.80–1.80)

## 2015-01-30 LAB — T3, FREE: T3, Free: 2.2 pg/mL — ABNORMAL LOW (ref 2.3–4.2)

## 2015-01-30 LAB — TSH: TSH: 4.906 u[IU]/mL — ABNORMAL HIGH (ref 0.350–4.500)

## 2015-01-30 NOTE — Progress Notes (Signed)
Patient ID: Colleen Peck, female   DOB: [birth year], 34 y.o.   MRN: [id number]    Subjective: CC: thyroid.   HPI: Patient is a 34 y.o. female presenting to clinic today for same day appointment. Concerns today include:  Patient reports that she continues to have difficulty sleeping.  She slept after taking Trazodone.  She reports that she is also not eating well.  She is forcing herself to eat daily.  She has no appetite.  She reports difficulty concentrating.  She is fatigued but also has a lot of energy.  She feels that her thyroid is causing a lot of her symptoms.  She is concerned that she is having symptoms similar to those before she ended up in a mental institution.  She is reluctant to see an endocrinologist because she has had bad experiences in the past.  She reports high amounts of stress related to getting her master's degree.  She is currently finishing a documentary.   Social History Reviewed: non smoker.   FamHx and MedHx updated.  Please see EMR. Health Maintenance: UTD  ROS: All other systems reviewed and are negative.  Objective: Office vital signs reviewed. BP 102/64 mmHg  Pulse 91  Wt 244 lb 9.6 oz (110.95 kg)  LMP 01/30/2015  Physical Examination:  General: Awake, alert, well nourished, NAD HEENT: Normal, EOMI, no exopthalalmos  Neck: supple, no thyromegaly, no palpable nodules Cardio: RRR, S1S2 heard, no murmurs appreciated Pulm: CTAB, no wheezes, rhonchi or rales, no increased WOB Extremities: WWP, No edema, cyanosis or clubbing; +2 pulses bilaterally MSK: Normal gait and station  Assessment: 34 y.o. female with insomnia ?if hyperthyroid.  Plan: See Problem List and After Visit Summary   Janora Norlander, DO PGY-2, [facility]

## 2015-01-30 NOTE — Addendum Note (Signed)
Addended by: Janora Norlander on: 01/30/2015 11:56 AM   Modules accepted: Orders

## 2015-01-30 NOTE — Telephone Encounter (Signed)
You can double book my 1045am slot for this patient.

## 2015-01-30 NOTE — Assessment & Plan Note (Signed)
Concerned that patient is having hyperthyroid symptoms, esp given poor appetite and insomnia. -Thyroid panel today. Will call with results -Will decrease Synthroid if appropriate -Return precautions reviewed -Follow up in 1 month or sooner if needed

## 2015-01-30 NOTE — Telephone Encounter (Signed)
Spoke to pt.  She can be here at 10:45 am.  I told her to be on time as we were fitting her in. Ottis Stain, CMA

## 2015-01-30 NOTE — Assessment & Plan Note (Signed)
CBC reviewed.  Hgb 10.6, essentially stable.   -Obtain anemia panel.   -Patient would like IV Iron treatment, as she does not tolerate PO iron supplementation

## 2015-01-30 NOTE — Assessment & Plan Note (Signed)
Suspect that stress from school/job and possibly thyroid playing a role. -Continue Trazodone for sleep -Sleep hygiene encouraged

## 2015-01-30 NOTE — Patient Instructions (Addendum)
Plan to see me back in 1 month.  I will contact you will the results of your labs.  If anything is abnormal, I will call you.  Otherwise, expect a copy to be mailed to you. Dory Demont M. Lajuana Ripple, DO PGY-2, [facility]   Insomnia Insomnia is frequent trouble falling and/or staying asleep. Insomnia can be a long term problem or a short term problem. Both are common. Insomnia can be a short term problem when the wakefulness is related to a certain stress or worry. Long term insomnia is often related to ongoing stress during waking hours and/or poor sleeping habits. Overtime, sleep deprivation itself can make the problem worse. Every little thing feels more severe because you are overtired and your ability to cope is decreased. CAUSES   Stress, anxiety, and depression.  Poor sleeping habits.  Distractions such as TV in the bedroom.  Naps close to bedtime.  Engaging in emotionally charged conversations before bed.  Technical reading before sleep.  Alcohol and other sedatives. They may make the problem worse. They can hurt normal sleep patterns and normal dream activity.  Stimulants such as caffeine for several hours prior to bedtime.  Pain syndromes and shortness of breath can cause insomnia.  Exercise late at night.  Changing time zones may cause sleeping problems (jet lag). It is sometimes helpful to have someone observe your sleeping patterns. They should look for periods of not breathing during the night (sleep apnea). They should also look to see how long those periods last. If you live alone or observers are uncertain, you can also be observed at a sleep clinic where your sleep patterns will be professionally monitored. Sleep apnea requires a checkup and treatment. Give your caregivers your medical history. Give your caregivers observations your family has made about your sleep.  SYMPTOMS   Not feeling rested in the morning.  Anxiety and restlessness at  bedtime.  Difficulty falling and staying asleep. TREATMENT   Your caregiver may prescribe treatment for an underlying medical disorders. Your caregiver can give advice or help if you are using alcohol or other drugs for self-medication. Treatment of underlying problems will usually eliminate insomnia problems.  Medications can be prescribed for short time use. They are generally not recommended for lengthy use.  Over-the-counter sleep medicines are not recommended for lengthy use. They can be habit forming.  You can promote easier sleeping by making lifestyle changes such as:  Using relaxation techniques that help with breathing and reduce muscle tension.  Exercising earlier in the day.  Changing your diet and the time of your last meal. No night time snacks.  Establish a regular time to go to bed.  Counseling can help with stressful problems and worry.  Soothing music and white noise may be helpful if there are background noises you cannot remove.  Stop tedious detailed work at least one hour before bedtime. HOME CARE INSTRUCTIONS   Keep a diary. Inform your caregiver about your progress. This includes any medication side effects. See your caregiver regularly. Take note of:  Times when you are asleep.  Times when you are awake during the night.  The quality of your sleep.  How you feel the next day. This information will help your caregiver care for you.  Get out of bed if you are still awake after 15 minutes. Read or do some quiet activity. Keep the lights down. Wait until you feel sleepy and go back to bed.  Keep regular sleeping and waking hours. Avoid  naps.  Exercise regularly.  Avoid distractions at bedtime. Distractions include watching television or engaging in any intense or detailed activity like attempting to balance the household checkbook.  Develop a bedtime ritual. Keep a familiar routine of bathing, brushing your teeth, climbing into bed at the same time  each night, listening to soothing music. Routines increase the success of falling to sleep faster.  Use relaxation techniques. This can be using breathing and muscle tension release routines. It can also include visualizing peaceful scenes. You can also help control troubling or intruding thoughts by keeping your mind occupied with boring or repetitive thoughts like the old concept of counting sheep. You can make it more creative like imagining planting one beautiful flower after another in your backyard garden.  During your day, work to eliminate stress. When this is not possible use some of the previous suggestions to help reduce the anxiety that accompanies stressful situations. MAKE SURE YOU:   Understand these instructions.  Will watch your condition.  Will get help right away if you are not doing well or get worse. Document Released: 06/25/2000 Document Revised: 09/20/2011 Document Reviewed: 07/26/2007 [facility] Patient Information 2015 [place], [place]. This information is not intended to replace advice given to you by your health care provider. Make sure you discuss any questions you have with your health care provider.

## 2015-01-31 ENCOUNTER — Other Ambulatory Visit: Payer: Self-pay | Admitting: Family Medicine

## 2015-01-31 ENCOUNTER — Encounter: Payer: Self-pay | Admitting: Family Medicine

## 2015-01-31 DIAGNOSIS — E039 Hypothyroidism, unspecified: Secondary | ICD-10-CM

## 2015-01-31 LAB — ANEMIA PANEL
%SAT: 9 % — ABNORMAL LOW (ref 20–55)
ABS Retic: 45.4 10*3/uL (ref 19.0–186.0)
FERRITIN: 20 ng/mL (ref 10–291)
Folate: 10.4 ng/mL
IRON: 33 ug/dL — AB (ref 42–145)
RBC.: 4.13 MIL/uL (ref 3.87–5.11)
Retic Ct Pct: 1.1 % (ref 0.4–2.3)
TIBC: 351 ug/dL (ref 250–470)
UIBC: 318 ug/dL (ref 125–400)
VITAMIN B 12: 629 pg/mL (ref 211–911)

## 2015-01-31 MED ORDER — LEVOTHYROXINE SODIUM 25 MCG PO TABS: ORAL_TABLET | ORAL | Status: DC

## 2015-02-25 ENCOUNTER — Inpatient Hospital Stay (HOSPITAL_COMMUNITY)
Admission: AD | Admit: 2015-02-25 | Discharge: 2015-03-01 | DRG: 885 | Disposition: A | Discharge status: Home / Self Care | Payer: 59 | Source: Intra-hospital | Attending: Psychiatry | Admitting: Psychiatry

## 2015-02-25 ENCOUNTER — Encounter (HOSPITAL_COMMUNITY): Payer: Self-pay | Admitting: *Deleted

## 2015-02-25 ENCOUNTER — Encounter (HOSPITAL_COMMUNITY): Payer: Self-pay | Admitting: Nurse Practitioner

## 2015-02-25 ENCOUNTER — Emergency Department (HOSPITAL_COMMUNITY)
Admission: EM | Admit: 2015-02-25 | Discharge: 2015-02-25 | Disposition: A | Discharge status: Other Acute Inpatient Hospital | Payer: 59 | Attending: Emergency Medicine | Admitting: Emergency Medicine

## 2015-02-25 DIAGNOSIS — F419 Anxiety disorder, unspecified: Secondary | ICD-10-CM | POA: Insufficient documentation

## 2015-02-25 DIAGNOSIS — Z818 Family history of other mental and behavioral disorders: Secondary | ICD-10-CM

## 2015-02-25 DIAGNOSIS — R4182 Altered mental status, unspecified: Secondary | ICD-10-CM | POA: Diagnosis present

## 2015-02-25 DIAGNOSIS — Z833 Family history of diabetes mellitus: Secondary | ICD-10-CM

## 2015-02-25 DIAGNOSIS — Z79899 Other long term (current) drug therapy: Secondary | ICD-10-CM | POA: Diagnosis not present

## 2015-02-25 DIAGNOSIS — F329 Major depressive disorder, single episode, unspecified: Secondary | ICD-10-CM | POA: Insufficient documentation

## 2015-02-25 DIAGNOSIS — G4700 Insomnia, unspecified: Secondary | ICD-10-CM | POA: Diagnosis present

## 2015-02-25 DIAGNOSIS — Z8659 Personal history of other mental and behavioral disorders: Secondary | ICD-10-CM

## 2015-02-25 DIAGNOSIS — N390 Urinary tract infection, site not specified: Secondary | ICD-10-CM | POA: Diagnosis present

## 2015-02-25 DIAGNOSIS — J449 Chronic obstructive pulmonary disease, unspecified: Secondary | ICD-10-CM | POA: Diagnosis present

## 2015-02-25 DIAGNOSIS — E039 Hypothyroidism, unspecified: Secondary | ICD-10-CM | POA: Diagnosis not present

## 2015-02-25 DIAGNOSIS — F4310 Post-traumatic stress disorder, unspecified: Secondary | ICD-10-CM | POA: Diagnosis present

## 2015-02-25 DIAGNOSIS — F99 Mental disorder, not otherwise specified: Secondary | ICD-10-CM | POA: Diagnosis not present

## 2015-02-25 DIAGNOSIS — F310 Bipolar disorder, current episode hypomanic: Secondary | ICD-10-CM | POA: Diagnosis not present

## 2015-02-25 DIAGNOSIS — Z8249 Family history of ischemic heart disease and other diseases of the circulatory system: Secondary | ICD-10-CM

## 2015-02-25 DIAGNOSIS — Z7951 Long term (current) use of inhaled steroids: Secondary | ICD-10-CM | POA: Insufficient documentation

## 2015-02-25 DIAGNOSIS — F319 Bipolar disorder, unspecified: Secondary | ICD-10-CM | POA: Diagnosis present

## 2015-02-25 DIAGNOSIS — J309 Allergic rhinitis, unspecified: Secondary | ICD-10-CM

## 2015-02-25 DIAGNOSIS — F0633 Mood disorder due to known physiological condition with manic features: Secondary | ICD-10-CM | POA: Clinically undetermined

## 2015-02-25 DIAGNOSIS — F333 Major depressive disorder, recurrent, severe with psychotic symptoms: Secondary | ICD-10-CM | POA: Diagnosis present

## 2015-02-25 DIAGNOSIS — J45909 Unspecified asthma, uncomplicated: Secondary | ICD-10-CM | POA: Insufficient documentation

## 2015-02-25 DIAGNOSIS — F312 Bipolar disorder, current episode manic severe with psychotic features: Principal | ICD-10-CM | POA: Diagnosis present

## 2015-02-25 LAB — RAPID URINE DRUG SCREEN, HOSP PERFORMED
Amphetamines: NOT DETECTED
Barbiturates: NOT DETECTED
Benzodiazepines: NOT DETECTED
Cocaine: NOT DETECTED
Opiates: NOT DETECTED
Tetrahydrocannabinol: NOT DETECTED

## 2015-02-25 LAB — URINALYSIS, ROUTINE W REFLEX MICROSCOPIC
Glucose, UA: NEGATIVE mg/dL
Ketones, ur: >80 mg/dL — AB
Nitrite: NEGATIVE
Protein, ur: NEGATIVE mg/dL
Specific Gravity, Urine: 1.028 (ref 1.005–1.030)
Urobilinogen, UA: 0.2 mg/dL (ref 0.0–1.0)
pH: 5.5 (ref 5.0–8.0)

## 2015-02-25 LAB — COMPREHENSIVE METABOLIC PANEL WITH GFR
ALT: 19 U/L (ref 14–54)
AST: 20 U/L (ref 15–41)
Albumin: 4.2 g/dL (ref 3.5–5.0)
Alkaline Phosphatase: 61 U/L (ref 38–126)
Anion gap: 11 (ref 5–15)
BUN: 9 mg/dL (ref 6–20)
CO2: 22 mmol/L (ref 22–32)
Calcium: 9.5 mg/dL (ref 8.9–10.3)
Chloride: 106 mmol/L (ref 101–111)
Creatinine, Ser: 0.84 mg/dL (ref 0.44–1.00)
GFR calc Af Amer: >60 mL/min
GFR calc non Af Amer: >60 mL/min
Glucose, Bld: 78 mg/dL (ref 65–99)
Potassium: 3.7 mmol/L (ref 3.5–5.1)
Sodium: 139 mmol/L (ref 135–145)
Total Bilirubin: 0.7 mg/dL (ref 0.3–1.2)
Total Protein: 7.5 g/dL (ref 6.5–8.1)

## 2015-02-25 LAB — CBC WITH DIFFERENTIAL/PLATELET
Basophils Absolute: 0 K/uL (ref 0.0–0.1)
Basophils Relative: 0 % (ref 0–1)
Eosinophils Absolute: 0.4 K/uL (ref 0.0–0.7)
Eosinophils Relative: 5 % (ref 0–5)
HCT: 33.5 % — ABNORMAL LOW (ref 36.0–46.0)
Hemoglobin: 10.5 g/dL — ABNORMAL LOW (ref 12.0–15.0)
Lymphocytes Relative: 23 % (ref 12–46)
Lymphs Abs: 1.7 K/uL (ref 0.7–4.0)
MCH: 26.3 pg (ref 26.0–34.0)
MCHC: 31.3 g/dL (ref 30.0–36.0)
MCV: 83.8 fL (ref 78.0–100.0)
Monocytes Absolute: 0.5 K/uL (ref 0.1–1.0)
Monocytes Relative: 7 % (ref 3–12)
Neutro Abs: 4.8 K/uL (ref 1.7–7.7)
Neutrophils Relative %: 65 % (ref 43–77)
Platelets: 296 K/uL (ref 150–400)
RBC: 4 MIL/uL (ref 3.87–5.11)
RDW: 14.5 % (ref 11.5–15.5)
WBC: 7.5 K/uL (ref 4.0–10.5)

## 2015-02-25 LAB — ETHANOL: Alcohol, Ethyl (B): <5 mg/dL

## 2015-02-25 LAB — URINE MICROSCOPIC-ADD ON

## 2015-02-25 LAB — POC URINE PREG, ED: PREG TEST UR: NEGATIVE

## 2015-02-25 LAB — TSH: TSH: 0.887 u[IU]/mL (ref 0.350–4.500)

## 2015-02-25 MED ORDER — ALUM & MAG HYDROXIDE-SIMETH 200-200-20 MG/5ML PO SUSP
30.0000 mL | ORAL | Status: DC | PRN
Start: 2015-02-25 — End: 2015-03-01

## 2015-02-25 MED ORDER — ONDANSETRON HCL 4 MG PO TABS: 4.0000 mg | ORAL_TABLET | Freq: Three times a day (TID) | ORAL | Status: DC | PRN

## 2015-02-25 MED ORDER — IBUPROFEN 200 MG PO TABS: 600.0000 mg | ORAL_TABLET | Freq: Three times a day (TID) | ORAL | Status: DC | PRN

## 2015-02-25 MED ORDER — ACETAMINOPHEN 325 MG PO TABS: 650.0000 mg | ORAL_TABLET | ORAL | Status: DC | PRN

## 2015-02-25 MED ORDER — CEPHALEXIN 500 MG PO CAPS
500.0000 mg | ORAL_CAPSULE | Freq: Two times a day (BID) | ORAL | Status: DC
  Administered 2015-02-25 – 2015-03-01 (×7): 500 mg via ORAL
  Filled 2015-02-25: qty 2
  Filled 2015-02-25 (×5): qty 1
  Filled 2015-02-25: qty 2
  Filled 2015-02-25 (×5): qty 1

## 2015-02-25 MED ORDER — LORAZEPAM 1 MG PO TABS
1.0000 mg | ORAL_TABLET | Freq: Three times a day (TID) | ORAL | Status: DC | PRN
  Administered 2015-02-26: 1 mg via ORAL
  Filled 2015-02-25: qty 1

## 2015-02-25 MED ORDER — ACETAMINOPHEN 325 MG PO TABS: 650.0000 mg | ORAL_TABLET | Freq: Four times a day (QID) | ORAL | Status: DC | PRN

## 2015-02-25 MED ORDER — ZOLPIDEM TARTRATE 5 MG PO TABS: 5.0000 mg | ORAL_TABLET | Freq: Every evening | ORAL | Status: DC | PRN

## 2015-02-25 MED ORDER — MAGNESIUM HYDROXIDE 400 MG/5ML PO SUSP: 30.0000 mL | Freq: Every day | ORAL | Status: DC | PRN

## 2015-02-25 MED ORDER — CEPHALEXIN 500 MG PO CAPS: 500.0000 mg | ORAL_CAPSULE | Freq: Two times a day (BID) | ORAL | Status: DC

## 2015-02-25 MED ORDER — TRAZODONE HCL 100 MG PO TABS
100.0000 mg | ORAL_TABLET | Freq: Every day | ORAL | Status: DC
  Administered 2015-02-25 – 2015-02-28 (×4): 100 mg via ORAL
  Filled 2015-02-25 (×6): qty 1

## 2015-02-25 MED ORDER — RISPERIDONE 0.5 MG PO TBDP
0.5000 mg | ORAL_TABLET | Freq: Two times a day (BID) | ORAL | Status: DC
  Administered 2015-02-25: 0.5 mg via ORAL
  Filled 2015-02-25 (×6): qty 1

## 2015-02-25 NOTE — ED Notes (Signed)
Patient arrived on the unit tearful and confused.  She calmed down quickly and responded well to redirection.  When asked why she was in the hospital she stated, "because I am batshit crazy."  I assured her she was not as I oriented her to her surroundings.  Her mother is currently visiting and she is lying on her bed.  Urinalysis is consistent with a UTI and she has been drinking plenty of water.  She will be transferred to [facility] later this evening.

## 2015-02-25 NOTE — ED Notes (Signed)
Patient reports that she has had suicidal thoughts with no plan. Patient also admits to [place] but can't explain what she hears or what she sees. Plan of care discussed with patient. Encouragement and support provided and safety maintain. Q 15 min safety checks remain in place.

## 2015-02-25 NOTE — Clinical Social Work Note (Signed)
Called numerous times to [facility] to speak to RN and have cart put with pt so assessment could be completed.  No answer just put on hold for long time.  Supervisor called and asked someone in ED to put cart in front of pt so assessment can be completed.  Attempted assessment no answer will try again  .Dede Query, [facility]

## 2015-02-25 NOTE — Clinical Social Work Note (Signed)
Called the phone number on the consult and asked for Dondra Spry the NP who called for the psych consult but they stated they did not know who she was.  Also called the same number and asked for Virgel Manifold MD who was the attending on the consult and they stated he was gone for the day.  Dede Query, LCSW

## 2015-02-25 NOTE — ED Notes (Signed)
Bed: [facility] Expected date:  Expected time:  Means of arrival:  Comments: Hold for 21

## 2015-02-25 NOTE — ED Notes (Addendum)
Pt's mother states the pt has "not been making sense" for the past 3 days. Pt has also not been sleeping or eating for the past 2 days. Pt takes thyroid medication, the mother is not sure if the pt's medication has been changed recently. Mother states the pt was admitted in 2012 for similar symptoms and had to have her medications adjusted. Pt alert, oriented to person and place; pt states it is January.

## 2015-02-25 NOTE — BH Assessment (Signed)
Per Dr. Parke Poisson, pt meets IP criteria. Pt accepted at [facility], [phone]. Pt can come after 7pm. Support paperwork completed. Nursing report [phone].
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
## 2015-02-25 NOTE — ED Notes (Signed)
Pt transported to [facility] by [facility] transportation service  And escorted by Hettie Holstein, MHT due to patient confusion for continuation of specialized care.Patient had no belongings . Pt left in no acute distress.

## 2015-02-25 NOTE — ED Notes (Signed)
EKG ordered per PA d/t patient saying, "sometimes my heart does weird stuff and my chest feels tight after I don't sleep for a few nights." RR even/unlabored. No SOB noted.
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
## 2015-02-25 NOTE — Progress Notes (Signed)
D:Unable to fully complete assessment at this time. Patient is considered an unreliable historian at this time. When asked does she have pain, she states "no" then "yes".  When asked where her pain is located and to rate her pain she yells "I'm cold"! She is attempting to sing gospel songs throughout the assessment and has to be redirected numerous times throughout. She is oriented to person only. States the president is "Colleen Peck".  She says the date is 55-2 which is her room number that has been written on an index card. She endorses drinking alcohol (rum). When asked how many times a day she states 1-2-3 and continues counting. When asked does she take any drugs she states "Myself". She endorses AVH. States voices are saying "Colleen Peck". She endorses SI/HI. She is unable to verbally contract for safety. When asked does she have a plan she stares blankly. When asked who her HI intent is towards she states "Colleen Peck". When asked who Colleen Peck is she states her husband.  When asked how long she has been married she states "I don't want to play anymore". When asked about her goals while she is here on the unit. She states "Colleen Peck, Colleen Peck". When asked about her support system she starts singing again. When asked about her PCP she states Colleen Peck and Colleen Peck. She is disorganized with decreased concentration, confusion, labile mood, flat affect and unable to provide any additional reliable history.  A: Colleen Peck was escorted to her room and oriented to unit. She was yelling "Colleen Peck" as she walked down the hall. Had to be redirected several times to lower her voice. Upon entering the room she gets in her bed and pulls covers over her head. Instructed [name] that staff is available for any needs she may have and to contact one of the staff if she needs anything. Order obtained for Do Not Admit due to labile mental status and confusion with AVH.  R: Will continue to monitor per unit protocol for personal safety.

## 2015-02-26 ENCOUNTER — Encounter (HOSPITAL_COMMUNITY): Payer: Self-pay | Admitting: Psychiatry

## 2015-02-26 DIAGNOSIS — F0633 Mood disorder due to known physiological condition with manic features: Secondary | ICD-10-CM | POA: Clinically undetermined

## 2015-02-26 DIAGNOSIS — F319 Bipolar disorder, unspecified: Secondary | ICD-10-CM | POA: Clinically undetermined

## 2015-02-26 DIAGNOSIS — Z8659 Personal history of other mental and behavioral disorders: Secondary | ICD-10-CM

## 2015-02-26 DIAGNOSIS — E039 Hypothyroidism, unspecified: Secondary | ICD-10-CM | POA: Diagnosis present

## 2015-02-26 DIAGNOSIS — F310 Bipolar disorder, current episode hypomanic: Secondary | ICD-10-CM

## 2015-02-26 MED ORDER — HYDROXYZINE HCL 25 MG PO TABS
25.0000 mg | ORAL_TABLET | Freq: Four times a day (QID) | ORAL | Status: DC | PRN
  Filled 2015-02-26: qty 1
  Filled 2015-02-26: qty 6

## 2015-02-26 MED ORDER — LEVOTHYROXINE SODIUM 112 MCG PO TABS
224.0000 ug | ORAL_TABLET | Freq: Every day | ORAL | Status: DC
  Administered 2015-02-26 – 2015-02-28 (×3): 224 ug via ORAL
  Filled 2015-02-26 (×4): qty 2

## 2015-02-26 NOTE — BHH Group Notes (Signed)
[facility] LCSW Aftercare Discharge Planning Group Note   02/26/2015 2:21 PM  Participation Quality:  Came for first five minutes.  "What are we doing here?"  I explained.  She left.    Colleen Peck

## 2015-02-26 NOTE — BHH Suicide Risk Assessment (Signed)
[name] INPATIENT:  Family/Significant Other Suicide Prevention Education  Suicide Prevention Education:  Patient Refusal for Family/Significant Other Suicide Prevention Education: The patient Colleen Peck has refused to provide written consent for family/significant other to be provided Family/Significant Other Suicide Prevention Education during admission and/or prior to discharge.  Physician notified. SPE reviewed with patient and brochure provided. Patient encouraged to return to hospital if having suicidal thoughts, patient verbalized his/her understanding and has no further questions at this time.   Kenni Newton, Casimiro Needle 02/26/2015, 1:16 PM

## 2015-02-26 NOTE — Progress Notes (Signed)
D: Patient is alert and oriented x3, disoriented to situation. Pt's mood and affect is anxious, suspicious, and paranoid. Pt denies SI/HI and AVH. Pt states "How long have I been here? I feel cloudy. How long have you been here? Is this your first time caring for me?" Pt appears delusional, stating "I'm in grad school, did I miss my job interview." Pt is tachycardic, denies symptoms. Pt refused 0800am scheduled medications and states "I'd rather talk to the doctor first because I haven't taken that medicine in years." Pt states this afternoon "I'm just upset that I'm here on vacation, they got false information from someone. My mom works nightshift, she's tired herself and doesn't know what she's talking about. Pt reports she is in graduate school studying for her masters in education. 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 A: Active listening by RN. Encouragement/Support provided to pt. Will reassess/monitor BP and pulse. Urine specimen cup given to pt, awaiting urine sample. Medication education reviewed with pt.  15 minute checks continued per protocol for patient safety.  R: Patient cooperative and receptive to nursing interventions. Pt remains safe. Report given to receiving RN Santiago Glad at [phone].

## 2015-02-26 NOTE — Tx Team (Signed)
Interdisciplinary Treatment Plan Update (Adult)  Date:  02/26/2015   Time Reviewed:  2:58 PM   Progress in Treatment: Attending groups:No Participating in groups:  No Taking medication as prescribed:  Yes. Tolerating medication:  Yes. Family/Significant othe contact made:  Yes Patient understands diagnosis:  No  Limited insight Discussing patient identified problems/goals with staff:  Yes, see initial care plan. Medical problems stabilized or resolved:  Yes. Denies suicidal/homicidal ideation: Yes. Issues/concerns per patient self-inventory:  No. Other:  New problem(s) identified:  Discharge Plan or Barriers: return home, follow up outpt  Reason for Continuation of Hospitalization: Hallucinations Mania Medication stabilization Other; describe Lack of sleep  Comments:  Pt reports that she has no memory of what happened prior to coming to hospital. She remembers them as though they were dreams . Writer contacted Darlina Sicilian - mother along with patient on speaker phone. Per mother pt came back from orientation on Monday afternoon and was not sleeping right , not feeling well, so she stayed with mom that night. The next day AM , mother felt as though patient was not in her right state of mind . Pt there after started decompensating to the point that patient was not sleeping , not eating , talking to people from the past and unknown people - like her dead g.mother and a husband when she does not have a husband and also talking about " keeping her baby" when she is not even pregnant. Per mother pt had been having trouble sleeping for more than a month now. Pt was involved in a lot of activities and organizations and had trouble keeping up with these . Pt called her out pt provider and had asked for sleep medication (unknown) , which she was taking prn.   Trazodone trial for sleep.  Pt not interested in taking any other meds other than medical  Estimated length of stay: 4-5 days  New  goal(s):  Review of initial/current patient goals per problem list:   Review of initial/current patient goals per problem list:  1. Goal(s): Patient will participate in aftercare plan   Met: Yes   Target date: 3-5 days post admission date   As evidenced by: Patient will participate within aftercare plan AEB aftercare provider and housing plan at discharge being identified.  02/26/2015: Pt plans to return home, follow up outpt.    6. Goal (s): Patient will demonstrate decreased signs of mania  * Met: No  * Target date: 3-5 days post admission date  * As evidenced by: Patient demonstrate decreased signs of mania AEB decreased mood instability and demonstration of stable mood  02/26/2015 Pt not following up outpt, not taking meds, was suffering from lack of sleep, delusions     Attendees: Patient:  02/26/2015 2:58 PM   Family:   02/26/2015 2:58 PM   Physician:  Ursula Alert, MD 02/26/2015 2:58 PM   Nursing:   Gaylan Gerold, RN 02/26/2015 2:58 PM   CSW:    Roque Lias, [place]   02/26/2015 2:58 PM   Other:  02/26/2015 2:58 PM   Other:   02/26/2015 2:58 PM   Other:  Lars Pinks, Nurse CM 02/26/2015 2:58 PM   Other:  Lucinda Dell, [facility] 02/26/2015 2:58 PM   Other:  Norberto Sorenson, [place]  02/26/2015 2:58 PM   Other:  02/26/2015 2:58 PM   Other:  02/26/2015 2:58 PM   Other:  02/26/2015 2:58 PM   Other:  02/26/2015 2:58 PM   Other:  02/26/2015 2:58  PM   Other:   02/26/2015 2:58 PM    Scribe for Treatment Team:   Trish Mage, 02/26/2015 2:58 PM

## 2015-02-26 NOTE — Progress Notes (Signed)
Did not attend group 

## 2015-02-26 NOTE — Plan of Care (Signed)
Problem: Ineffective individual coping Goal: STG: Patient will remain free from self harm Outcome: Progressing Patient remains free from self harm. 15 minute checks continued per protocol for patient safety.   Problem: Diagnosis: Increased Risk For Suicide Attempt Goal: STG-Patient Will Attend All Groups On The Unit Outcome: Not Progressing Patient is not attending unit groups today. Goal: STG-Patient Will Comply With Medication Regime Outcome: Not Progressing Patient remains all 0800 scheduled medications today.

## 2015-02-26 NOTE — Tx Team (Signed)
Initial Interdisciplinary Treatment Plan   PATIENT STRESSORS: Medication change or noncompliance   PATIENT STRENGTHS: Physical Health Supportive family/friends   PROBLEM LIST: Problem List/Patient Goals Date to be addressed Date deferred Reason deferred Estimated date of resolution  "Colleen Peck" 02/26/2015     "Colleen Peck" 02/26/2015     Depression 02/26/2015     Psychosis/AMS 02/26/2015                                    DISCHARGE CRITERIA:  Ability to meet basic life and health needs Improved stabilization in mood, thinking, and/or behavior Motivation to continue treatment in a less acute level of care Need for constant or close observation no longer present Safe-care adequate arrangements made  PRELIMINARY DISCHARGE PLAN: Outpatient therapy Return to previous living arrangement Return to previous work or school arrangements  PATIENT/FAMIILY INVOLVEMENT: This treatment plan has been presented to and reviewed with the patient, Colleen Peck.  The patient and family have been given the opportunity to ask questions and make suggestions.  Gildardo Pounds 02/26/2015, 12:06 AM

## 2015-02-26 NOTE — BHH Group Notes (Signed)
[facility] Group Therapy  02/26/2015 , 2:22 PM    Type of Therapy:  Mental Health Association Presentation  Participation Level:  Active  Participation Quality:  Attentive  Affect:  Blunted  Cognitive:  Oriented  Insight:  Limited  Engagement in Therapy:  Engaged  Modes of Intervention:  Discussion, Education and Socialization  Summary of Progress/Problems:  Shanon Brow from [place] came to present his recovery story and play the guitar.  Invited.  Chose to not attend.  Roque Lias B 02/26/2015 , 2:22 PM
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
## 2015-02-26 NOTE — Progress Notes (Signed)
Patient ID: Colleen Peck, female   DOB: [birth year], 34 y.o.   MRN: [id number] D: Writer in bed, peeking out from under covers "Jesus" "Jesus" "me" "you" "Jesus" "I'm scared" "why am I here" A: Writer introduced self to client, provided emotional support, ensured client she was safe and would see the physician in the morning and at that time she could speak with her about discharge plans. Reviewed medication, administered as ordered. Client walked to the dayroom and given a snack. Staff will monitor q41min for safety. R: Client is safe on the unit. Once in the dayroom client became tearful "home" "home" then grabbed writer hands squeezing it and asking for her dad. After calming down, client throws hand up in air yelling "Jesus" then began pointing, dropping her cup of soda and crackers. Client was lead back to her room, but she comes back to nursing station "Nurse Practitioner" Write once again redirected client and assured her she would be able to speak to physician in the morning.

## 2015-02-26 NOTE — [facility] Suicide Risk Assessment (Signed)
[facility] Admission Suicide Risk Assessment   Nursing information obtained from:    Demographic factors:    Current Mental Status:    Loss Factors:    Historical Factors:    Risk Reduction Factors:    Total Time spent with patient: 30 minutes Principal Problem: Bipolar disorder Diagnosis:   Patient Active Problem List   Diagnosis Date Noted  . Bipolar disorder [F31.9] 02/26/2015  . Hypothyroidism [E03.9] 02/26/2015  . Anemia [D64.9] 01/30/2015  . Keloid of skin [L91.0] 09/04/2014  . Preventative health care [Z00.00] 09/04/2014  . Hearing difficulty of both ears [H91.93] 09/04/2014  . CAP (community acquired pneumonia) [J18.9] 08/05/2014  . Influenza [J11.1] 07/31/2014  . Foot swelling [M79.89] 05/08/2014  . Bloody discharge from right nipple [N64.52] 12/17/2013  . Sleeping difficulty [G47.9] 11/26/2013  . Unspecified vitamin D deficiency [E55.9] 05/22/2013  . Galactorrhea [O92.6] 11/30/2011  . Preventive measure [Z41.8] 11/30/2011  . Hypothyroidism (acquired) [E03.9] 11/29/2011  . Posttraumatic stress disorder [F43.10] 06/17/2011  . Depression with anxiety [F41.8] 05/19/2011    Class: Present on Admission  . ANGIOEDEMA [T78.3XXA] 05/02/2008  . Obesity [E66.9] 09/08/2006  . RHINITIS, ALLERGIC [J30.9] 09/08/2006  . ASTHMA, INTERMITTENT [J45.909] 09/08/2006  . ECZEMA, ATOPIC DERMATITIS [L20.9] 09/08/2006     Continued Clinical Symptoms:  Alcohol Use Disorder Identification Test Final Score (AUDIT): 0 The "Alcohol Use Disorders Identification Test", Guidelines for Use in Primary Care, Second Edition.  World [facility]). Score between 0-7:  no or low risk or alcohol related problems. Score between 8-15:  moderate risk of alcohol related problems. Score between 16-19:  high risk of alcohol related problems. Score 20 or above:  warrants further diagnostic evaluation for alcohol dependence and treatment.   CLINICAL FACTORS:   Previous Psychiatric Diagnoses and  Treatments Medical Diagnoses and Treatments/Surgeries   Musculoskeletal: Strength & Muscle Tone: within normal limits Gait & Station: normal Patient leans: N/A  Psychiatric Specialty Exam: Physical Exam  ROS  Blood pressure 116/75, pulse 113, temperature 98.8 F (37.1 C), temperature source Oral, resp. rate 16, height 5' 6.25" (1.683 m), weight 108.863 kg (240 lb), last menstrual period 01/30/2015.Body mass index is 38.43 kg/(m^2).                Please see H&P.                                          COGNITIVE FEATURES THAT CONTRIBUTE TO RISK:  Closed-mindedness, Polarized thinking and Thought constriction (tunnel vision)    SUICIDE RISK:   Minimal: No identifiable suicidal ideation.  Patients presenting with no risk factors but with morbid ruminations; may be classified as minimal risk based on the severity of the depressive symptoms  PLAN OF CARE: Please see H&P.   Medical Decision Making:  Review of Psycho-Social Stressors (1), Discuss test with performing physician (1), Established Problem, Worsening (2), Review of Last Therapy Session (1), Review of Medication Regimen & Side Effects (2) and Review of New Medication or Change in Dosage (2)  I certify that inpatient services furnished can reasonably be expected to improve the patient's condition.   Vida Nicol md 02/26/2015, 11:22 AM
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
## 2015-02-27 LAB — URINALYSIS W MICROSCOPIC (NOT AT ARMC)
Bilirubin Urine: NEGATIVE
GLUCOSE, UA: NEGATIVE mg/dL
KETONES UR: NEGATIVE mg/dL
LEUKOCYTES UA: NEGATIVE
Nitrite: NEGATIVE
PH: 5.5 (ref 5.0–8.0)
PROTEIN: NEGATIVE mg/dL
Specific Gravity, Urine: 1.014 (ref 1.005–1.030)
UROBILINOGEN UA: 0.2 mg/dL (ref 0.0–1.0)

## 2015-02-27 LAB — URINE CULTURE

## 2015-02-27 MED ORDER — BECLOMETHASONE DIPROPIONATE 40 MCG/ACT IN AERS
1.0000 | INHALATION_SPRAY | Freq: Two times a day (BID) | RESPIRATORY_TRACT | Status: DC
  Administered 2015-02-28 – 2015-03-01 (×2): 1 via RESPIRATORY_TRACT
  Filled 2015-02-27: qty 8.7

## 2015-02-27 MED ORDER — ALBUTEROL SULFATE (2.5 MG/3ML) 0.083% IN NEBU: 3.0000 mg | INHALATION_SOLUTION | Freq: Four times a day (QID) | RESPIRATORY_TRACT | Status: DC | PRN

## 2015-02-27 MED ORDER — ALBUTEROL SULFATE HFA 108 (90 BASE) MCG/ACT IN AERS
2.0000 | INHALATION_SPRAY | Freq: Four times a day (QID) | RESPIRATORY_TRACT | Status: DC | PRN
  Administered 2015-02-27 – 2015-02-28 (×2): 2 via RESPIRATORY_TRACT
  Filled 2015-02-27: qty 6.7

## 2015-02-27 NOTE — Progress Notes (Signed)
D- Patient alert and oriented.  Patient is pleasant with a bright affect and states "I slept well last night and feel so much better".  Denies SI, HI, AVH, and pain. Patient denies any feelings of depression, anxiety, and feelings of hopelessness this shift.  Patient has been observed interacting well with others in the milieu and actively attending groups.  Her goal for today is to practice self-care and get plenty of rest.  Patient is requesting that she get a Doctors Note for school and work.  She is also requesting that in the notes it states that she will need an extra week of rest following discharge. A-  Support and encouragement provided.  Routine safety checks conducted every 15 minutes.  Patient informed to notify staff with problems or concerns. R- Patient contracts for safety at this time. Patient receptive, calm, and cooperative. Patient interacts well with others on the unit.  Patient remains safe at this time.

## 2015-02-27 NOTE — Progress Notes (Signed)
[facility] MD Progress Note  02/27/2015 3:07 PM Cary Wilford  MRN:  [id number] Subjective: Patient states " I feel tired. I feel I can go back to bed and sleep now. There was a lot going on the past few months . I was doing a lot of stuff and did not get any rest. 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 Pt was brought in by her mother and her friend [place] .  Pt on presentation did not have any memory of the events that led to her hospitalization. Pt however per mother was seen as talking to dead grand mother as well as husband that she did not have during that time. Pt seen this AM . Pt reports feeling less anxious and more in control of her thoughts. Pt was able to sleep well on the Trazodone last night and feels that it really helped. Pt denies any mood swings , sadness , but on observations is seen as having pressured speech , as well as anxiety. Pt however is not open to taking a mood stabilizer or antipsychotic at this time. She feels that each time she had such presentations in the past -her TSH was abnormal and she was not taking her thyroid medications regularly. Pt however would like to continue taking her Trazodone at night. Discussed with pt that she will be observed on the unit and further recommendations will be made.    Principal Problem: Bipolar and related disorder due to another medical condition with manic or hypomanic-like episodes R/O Bipolar disorder type I manic      Diagnosis:   Patient Active Problem List   Diagnosis Date Noted  . Hypothyroidism [E03.9] 02/26/2015  . Bipolar and related disorder due to another medical condition with manic or hypomanic-like episodes [F06.33] 02/26/2015  . History of posttraumatic  stress disorder (PTSD) [Z86.59] 02/26/2015  . Anemia [D64.9] 01/30/2015  . Keloid of skin [L91.0] 09/04/2014  . Preventative health care [Z00.00] 09/04/2014  . Hearing difficulty of both ears [H91.93] 09/04/2014  . CAP (community acquired pneumonia) [J18.9] 08/05/2014  . Influenza [J11.1] 07/31/2014  . Foot swelling [M79.89] 05/08/2014  . Bloody discharge from right nipple [N64.52] 12/17/2013  . Sleeping difficulty [G47.9] 11/26/2013  . Unspecified vitamin D deficiency [E55.9] 05/22/2013  . Galactorrhea [O92.6] 11/30/2011  . Preventive measure [Z41.8] 11/30/2011  . Hypothyroidism (acquired) [E03.9] 11/29/2011  . Posttraumatic stress disorder [F43.10] 06/17/2011  . Depression with anxiety [F41.8] 05/19/2011    Class: Present on Admission  . ANGIOEDEMA [T78.3XXA] 05/02/2008  . Obesity [E66.9] 09/08/2006  . RHINITIS, ALLERGIC [J30.9] 09/08/2006  . ASTHMA, INTERMITTENT [J45.909] 09/08/2006  . ECZEMA, ATOPIC DERMATITIS [L20.9] 09/08/2006   Total Time spent with patient: 25 minutes   Past Medical History:  Past Medical History  Diagnosis Date  . Asthma   . Seasonal allergies   . Depression   . PTSD (post-traumatic stress disorder)   . Thyroid disease 2009    Graves disease (pt reported resolved); hypothyriodism  . Abnormal pap     pt reports abnl pap many years ago.  Nl since then.  . Palpitations 03/12/2008    Past Surgical History  Procedure Laterality Date  . Dilation and curettage of uterus  March 2006   Family  History:  Family History  Problem Relation Age of Onset  . Drug abuse Father   . Depression Maternal Aunt   . Depression Maternal Grandmother   . Anxiety disorder Maternal Grandmother   . COPD Maternal Grandmother   . Suicidality Cousin   . Depression Cousin   . Depression Maternal Aunt   . Hypertension Mother   . Diabetes Paternal Grandfather   . COPD Paternal Grandmother   . Heart disease Neg Hx    Social History:  History  Alcohol Use  . 0.6 oz/week   . 1 Glasses of wine per week    Comment: past use of alcohol in '08-'09     History  Drug Use  . Yes  . Special: Marijuana    Comment: past use of marijuana in '08-'09. occasional eats brownies w/ marijuana    Social History   Social History  . Marital Status: Single    Spouse Name: N/A  . Number of Children: N/A  . Years of Education: N/A   Social History Main Topics  . Smoking status: Never Smoker   . Smokeless tobacco: Never Used  . Alcohol Use: 0.6 oz/week    1 Glasses of wine per week     Comment: past use of alcohol in '08-'09  . Drug Use: Yes    Special: Marijuana     Comment: past use of marijuana in '08-'09. occasional eats brownies w/ marijuana  . Sexual Activity: Not Currently    Birth Control/ Protection: Abstinence   Other Topics Concern  . 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   Additional History:    Sleep: Fair  Appetite:  Fair     Musculoskeletal: Strength & Muscle Tone: within normal limits Gait & Station: normal Patient leans: N/A   Psychiatric Specialty Exam: Physical Exam  Review of Systems  Constitutional: Positive for malaise/fatigue.  Psychiatric/Behavioral: Negative for depression. The patient is nervous/anxious.   All other systems reviewed and are negative.   Blood pressure 118/104, pulse 126, temperature 98.1 F (36.7 C), temperature source Oral, resp. rate 18, height 5' 6.25" (1.683 m), weight 108.863 kg (240 lb), last menstrual period 01/30/2015.Body mass index is 38.43 kg/(m^2).  General Appearance: Fairly Groomed  Engineer, water::  Fair  Speech:  Pressured  Volume:  Normal  Mood:  Anxious  Affect:  Congruent  Thought Process:  Goal Directed  Orientation:  Full (Time, Place, and Person)  Thought Content:  Rumination  Suicidal Thoughts:  No  Homicidal Thoughts:  No  Memory:  Immediate;   Fair Recent;   Fair Remote;   Fair   Judgement:  Impaired  Insight:  Shallow  Psychomotor Activity:  Restlessness  Concentration:  Fair  Recall:  [employer] of Knowledge:Fair  Language: Fair  Akathisia:  No  Handed:  Right  AIMS (if indicated):     Assets:  Desire for Improvement  ADL's:  Intact  Cognition: WNL  Sleep:  Number of Hours: 6.75     Current Medications: Current Facility-Administered Medications  Medication Dose Route Frequency Provider Last Rate Last Dose  . acetaminophen (TYLENOL) tablet 650 mg  650 mg Oral Q6H PRN Delfin Gant, NP      . alum & mag hydroxide-simeth (MAALOX/MYLANTA) 200-200-20 MG/5ML suspension 30 mL  30 mL Oral Q4H PRN Delfin Gant, NP      . cephALEXin (KEFLEX) capsule 500 mg  500 mg Oral Q12H Delfin Gant, NP   500 mg at 02/27/15 0809  . hydrOXYzine (ATARAX/VISTARIL) tablet 25 mg  25 mg Oral Q6H PRN Ursula Alert, MD      . levothyroxine (SYNTHROID, LEVOTHROID) tablet 224 mcg  224 mcg Oral QHS Ursula Alert, MD   224 mcg at 02/26/15 2147  . magnesium hydroxide (MILK OF MAGNESIA) suspension 30 mL  30 mL Oral Daily PRN Delfin Gant, NP      . traZODone (DESYREL) tablet 100 mg  100 mg Oral QHS Delfin Gant, NP   100 mg at 02/26/15 2147    Lab Results:  Results for orders placed or performed during the hospital encounter of 02/25/15 (from the past 48 hour(s))  Urinalysis with microscopic     Status: Abnormal   Collection Time: 02/27/15  7:00 AM  Result Value Ref Range   Color, Urine YELLOW YELLOW   APPearance CLOUDY (A) CLEAR   Specific Gravity, Urine 1.014 1.005 - 1.030   pH 5.5 5.0 - 8.0   Glucose, UA NEGATIVE NEGATIVE mg/dL   Hgb urine dipstick MODERATE (A) NEGATIVE   Bilirubin Urine NEGATIVE NEGATIVE   Ketones, ur NEGATIVE NEGATIVE mg/dL   Protein, ur NEGATIVE NEGATIVE mg/dL   Urobilinogen, UA 0.2 0.0 - 1.0 mg/dL   Nitrite NEGATIVE NEGATIVE   Leukocytes, UA NEGATIVE NEGATIVE   Urine-Other URINALYSIS PERFORMED ON SUPERNATANT     Comment:  FIELD OBSCURED BY AMORPHOUS MATERIAL Performed at [facility]     Physical Findings: AIMS: Facial and Oral Movements Muscles of Facial Expression: None, normal Lips and Perioral Area: None, normal Jaw: None, normal Tongue: None, normal,Extremity Movements Upper (arms, wrists, hands, fingers): None, normal Lower (legs, knees, ankles, toes): None, normal, Trunk Movements Neck, shoulders, hips: None, normal, Overall Severity Severity of abnormal movements (highest score from questions above): None, normal Incapacitation due to abnormal movements: None, normal Patient's awareness of abnormal movements (rate only patient's report): No Awareness, Dental Status Current problems with teeth and/or dentures?: No Does patient usually wear dentures?: No  CIWA:  CIWA-Ar Total: 10 COWS:  COWS Total Score: 4  Assessment:  Joelynn Dust is a 34 y.o [place] American female who is single , lives in [place] , a Insurance underwriter at [employer], 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 Pt was brought in by her mother and her friend [place] . Pt continues to refuse medications other than Trazodone for sleep. Will observe on the unit.     Treatment Plan Summary: Daily contact with patient to assess and evaluate symptoms and progress in treatment and Medication management Discussed with pt again today , the need to start her on an antipsychotic as well as mood stabilizer . However pt is not open to discussion at this time. Pt feels that her problems are solely because of her thyroid levels and her not being able to sleep. Will continue Trazodone 100 mg po qhs for sleep. Restart her home dose of levothyroxine 224 mcg at bedtime. Pt wants to take it at bedtime - states her provider agreed to it.TSH reviewed - wnl -0.887 (02/25/15) . 3 weeks ago - was 4.906 (  elevated) Pt also with UTI - started on Keflex 500 mg po bid .  Provide medication education , encourage pt to take her medication. Pt to be observed on the unit.  Will  continue to monitor vitals ,medication compliance and treatment side effects while patient is here.  Will monitor for medical issues as well as call consult as needed.  CSW will start working on disposition.  Patient to participate in therapeutic milieu .   Medical Decision Making:  Review of Psycho-Social Stressors (1), Review or order clinical lab tests (1), Review and summation of old records (2), Review of Last Therapy Session (1), Review of Medication Regimen & Side Effects (2) and Review of New Medication or Change in Dosage (2)     Jaylnn Ullery MD 02/27/2015, 3:07 PM

## 2015-02-27 NOTE — [facility] Group Notes (Signed)
[facility] Group Notes:  (Counselor/Nursing/MHT/Case Management/Adjunct)  02/27/2015 1:15PM  Type of Therapy:  Group Therapy  Participation Level:  Active  Participation Quality:  Appropriate  Affect:  Flat  Cognitive:  Oriented  Insight:  Improving  Engagement in Group:  Limited  Engagement in Therapy:  Limited  Modes of Intervention:  Discussion, Exploration and Socialization  Summary of Progress/Problems: The topic for group was balance in life.  Pt participated in the discussion about when their life was in balance and out of balance and how this feels.  Pt discussed ways to get back in balance and short term goals they can work on to get where they want to be.  Stayed the entire time.  Engaged throughout.  "I fell balanced because I have been getting some sleep again, I am thinking clearly and I am relaxed.  That is not the way things were before I came in.  I was overwhelmed and not getting any sleep."  Attributes her balance now to getting sleep.  Agreed with others that helping others can do wonders for [place] emotionally, but also cautioned against putting others first and ourselves second.  Lots of good feedback to others-encouragement, ideas and challenges.   Roque Lias B 02/27/2015 4:18 PM

## 2015-02-27 NOTE — Progress Notes (Signed)
Pt at this time continue to be alert and oriented to place, time and self; Pt however continue to minimize situation; she states, "I don't think I need to be here; I remember everything I was doing yesterday, but I know I was dream; all I needed was to self, that is all; everything my mom said about me where all assumptions." Pt is angry at her mom. 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 Pt however denies pain, SI, HI, and AVH. Pt is nonviolent.  A: Medications administered as prescribed.  Pt refused attended group from being paranoid that she may be recognised. Support, encouragement, and safe environment provided.  15-minute safety checks continue.  R: Pt was med compliant.  Safety checks continue.

## 2015-02-28 LAB — URINE CULTURE
CULTURE: NO GROWTH
SPECIAL REQUESTS: NORMAL

## 2015-02-28 NOTE — Progress Notes (Signed)
  [facility] Adult Case Management Discharge Plan :  Will you be returning to the same living situation after discharge:  Yes,  home At discharge, do you have transportation home?: Yes,  family Do you have the ability to pay for your medications: Yes,  insurance  Release of information consent forms completed and in the chart;  Patient's signature needed at discharge.  Patient to Follow up at: Follow-up Information    Follow up with [facility] On 03/06/2015.   Why:  Medication management appointment with Dr. Lajuana Ripple on Thursday August 25th at 11:15am. Please call office if you need to reschedule appt.   Contact information:   [facility]      Patient denies SI/HI: Yes,  yes    Safety Planning and Suicide Prevention discussed: Yes,  yes  Have you used any form of tobacco in the last 30 days? (Cigarettes, Smokeless Tobacco, Cigars, and/or Pipes): Patient Refused Screening  Has patient been referred to the Quitline?: Patient refused referral  Trish Mage 02/28/2015, 4:29 PM

## 2015-02-28 NOTE — Progress Notes (Signed)
D: Pt denies SI/HI/AVH. Pt is is very condescending and sarcastic with her tone to Probation officer. Pt asked writer what time she was leaving in the morning, pt was informed that was not reported in report. Pt was informed that the Dr not stated she was scheduled for possible D/C. Pt appeared to become irritable because writer could not tell the pt what time she was leaving tomorrow. Pt was told that she would have to talk to the Dr. Since her D/C paperwork was not started . Pt was offered her  QVAR inhaler. Pt was informed that the documentation stated she had refused the medication 2 times . Pt became very upset stating that we were falsifying documentation. Pt was informed that all I have to go by is that she refused the medication. Pt then became irate stating that she did not feel safe with me as her nurse. Pt was then informed that I was more concerned about what was going on with her right now, we can figure the other things out later, I was more concerned about her safety now and not about could of and would of's she was speaking about , I was concerned about the real and factual present and her immediate future .   A: Pt was offered support and encouragement. Pt was given scheduled medications. Pt was encourage to attend groups. Q 15 minute checks were done for safety.   R: Pt is taking medication. Pt receptive to treatment and safety maintained on unit.

## 2015-02-28 NOTE — Progress Notes (Cosign Needed)
D) Pt affect incongruent. Mood guarded and superficial. Pt has been positive for groups and unit activities with minimal prompting. Pt has been interacting with peers in the dayroom. Pt can be loud and sarcastic at times, other times is superficially sweet. Pr denies avh or s.i. Pt hygiene appropriate. Appetite good. A) Level 3 obs for safety, suport and encouragement provided. Med ed reinforced. R) Superficial, guarded.

## 2015-02-28 NOTE — Progress Notes (Signed)
[facility] Group Notes:  (Nursing/MHT/Case Management/Adjunct)  Date:  02/28/2015  Time:  10:33 PM  Type of Therapy:  Psychoeducational Skills  Participation Level:  Minimal  Participation Quality:  Appropriate  Affect:  Appropriate  Cognitive:  Appropriate  Insight:  Appropriate  Engagement in Group:  Developing/Improving  Modes of Intervention:  Education  Summary of Progress/Problems: Patient had a good day overall since she felt "less sad" today. As a theme for the day, her relapse prevention will include trying to focus on herself first.   Gennette Pac 02/28/2015, 10:33 PM

## 2015-02-28 NOTE — Progress Notes (Addendum)
[facility] MD Progress Note  02/28/2015 12:32 PM Veronica Fretz  MRN:  [id number] Subjective: Patient states " I feel happy today. I was listening to all the crazy things that I did prior to coming to the hospital. I felt they were all dreams , but now I know . 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 Pt was brought in by her mother and her friend [place] . Pt on presentation did not have any memory of the events that led to her hospitalization. Pt however per mother was seen as talking to dead grand mother as well as husband that she did not have during that time.   Pt seen this AM . Pt reports feeling more collected , however appears to be laughing to self often about the recent phone conversation that she had with her sister . Pt reports mood as stable, however her affect is labile. Pt reports anxiety as decreased, sleep is improving. She continues to have no real recollection of the events that happened prior to admission and feels that she was just sleep deprived. Pt continues to refuse any medications and wants to stay only on Trazodone.  Per staff - pt without any disruptive issues on the unit. Has been compliant on her medications.     Principal Problem: Bipolar and related disorder due to another medical condition with manic or hypomanic-like episodes 2/2 hypothyroidism                                  R/O Bipolar disorder type I manic      Diagnosis:   Patient Active Problem List   Diagnosis Date Noted  . Hypothyroidism [E03.9] 02/26/2015  . Bipolar and related disorder due to another medical condition with manic or hypomanic-like episodes [F06.33] 02/26/2015   . History of posttraumatic stress disorder (PTSD) [Z86.59] 02/26/2015  . Anemia [D64.9] 01/30/2015  . Keloid of skin [L91.0] 09/04/2014  . Preventative health care [Z00.00] 09/04/2014  . Hearing difficulty of both ears [H91.93] 09/04/2014  . CAP (community acquired pneumonia) [J18.9] 08/05/2014  . Influenza [J11.1] 07/31/2014  . Foot swelling [M79.89] 05/08/2014  . Bloody discharge from right nipple [N64.52] 12/17/2013  . Sleeping difficulty [G47.9] 11/26/2013  . Unspecified vitamin D deficiency [E55.9] 05/22/2013  . Galactorrhea [O92.6] 11/30/2011  . Preventive measure [Z41.8] 11/30/2011  . Hypothyroidism (acquired) [E03.9] 11/29/2011  . Posttraumatic stress disorder [F43.10] 06/17/2011  . Depression with anxiety [F41.8] 05/19/2011    Class: Present on Admission  . ANGIOEDEMA [T78.3XXA] 05/02/2008  . Obesity [E66.9] 09/08/2006  . RHINITIS, ALLERGIC [J30.9] 09/08/2006  . ASTHMA, INTERMITTENT [J45.909] 09/08/2006  . ECZEMA, ATOPIC DERMATITIS [L20.9] 09/08/2006   Total Time spent with patient: 25 minutes   Past Medical History:  Past Medical History  Diagnosis Date  . Asthma   . Seasonal allergies   . Depression   . PTSD (post-traumatic stress disorder)   . Thyroid disease 2009    Graves disease (pt reported resolved); hypothyriodism  . Abnormal pap     pt reports abnl pap many years ago.  Nl since then.  Marland Kitchen  Palpitations 03/12/2008    Past Surgical History  Procedure Laterality Date  . Dilation and curettage of uterus  March 2006   Family History:  Family History  Problem Relation Age of Onset  . Drug abuse Father   . Depression Maternal Aunt   . Depression Maternal Grandmother   . Anxiety disorder Maternal Grandmother   . COPD Maternal Grandmother   . Suicidality Cousin   . Depression Cousin   . Depression Maternal Aunt   . Hypertension Mother   . Diabetes Paternal Grandfather   . COPD Paternal Grandmother   . Heart disease Neg Hx    Social History:  History   Alcohol Use  . 0.6 oz/week  . 1 Glasses of wine per week    Comment: past use of alcohol in '08-'09     History  Drug Use  . Yes  . Special: Marijuana    Comment: past use of marijuana in '08-'09. occasional eats brownies w/ marijuana    Social History   Social History  . Marital Status: Single    Spouse Name: N/A  . Number of Children: N/A  . Years of Education: N/A   Social History Main Topics  . Smoking status: Never Smoker   . Smokeless tobacco: Never Used  . Alcohol Use: 0.6 oz/week    1 Glasses of wine per week     Comment: past use of alcohol in '08-'09  . Drug Use: Yes    Special: Marijuana     Comment: past use of marijuana in '08-'09. occasional eats brownies w/ marijuana  . Sexual Activity: Not Currently    Birth Control/ Protection: Abstinence   Other Topics Concern  . 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   Additional History:    Sleep: Fair  Appetite:  Fair     Musculoskeletal: Strength & Muscle Tone: within normal limits Gait & Station: normal Patient leans: N/A   Psychiatric Specialty Exam: Physical Exam  Review of Systems  Constitutional: Positive for malaise/fatigue.  Psychiatric/Behavioral: Negative for depression. The patient is nervous/anxious.   All other systems reviewed and are negative.   Blood pressure 118/104, pulse 126, temperature 98.1 F (36.7 C), temperature source Oral, resp. rate 18, height 5' 6.25" (1.683 m), weight 108.863 kg (240 lb), last menstrual period 01/30/2015.Body mass index is 38.43 kg/(m^2).  General Appearance: Fairly Groomed  Engineer, water::  Fair  Speech:  Pressured  Volume:  Normal  Mood:  Anxious  Affect:  Congruent  Thought Process:  Goal Directed  Orientation:  Full (Time, Place, and Person)  Thought Content:  Rumination  Suicidal Thoughts:  No  Homicidal Thoughts:  No  Memory:  Immediate;   Fair Recent;    Fair Remote;   Fair  Judgement:  Impaired  Insight:  Shallow  Psychomotor Activity:  Restlessness  Concentration:  Fair  Recall:  [employer] of Knowledge:Fair  Language: Fair  Akathisia:  No  Handed:  Right  AIMS (if indicated):     Assets:  Desire for Improvement  ADL's:  Intact  Cognition: WNL  Sleep:  Number of Hours: 6.5     Current Medications: Current Facility-Administered Medications  Medication Dose Route Frequency Provider Last Rate Last Dose  . acetaminophen (TYLENOL) tablet 650 mg  650 mg Oral Q6H PRN Delfin Gant, NP      . albuterol (PROVENTIL HFA;VENTOLIN HFA) 108 (90 BASE)  MCG/ACT inhaler 2 puff  2 puff Inhalation Q6H PRN Harriet Butte, NP   2 puff at 02/27/15 2049  . albuterol (PROVENTIL) (2.5 MG/3ML) 0.083% nebulizer solution 3 mg  3 mg Nebulization Q6H PRN Harriet Butte, NP      . alum & mag hydroxide-simeth (MAALOX/MYLANTA) 200-200-20 MG/5ML suspension 30 mL  30 mL Oral Q4H PRN Delfin Gant, NP      . beclomethasone (QVAR) 40 MCG/ACT inhaler 1 puff  1 puff Inhalation BID Harriet Butte, NP   1 puff at 02/27/15 2200  . cephALEXin (KEFLEX) capsule 500 mg  500 mg Oral Q12H Delfin Gant, NP   500 mg at 02/28/15 6270  . hydrOXYzine (ATARAX/VISTARIL) tablet 25 mg  25 mg Oral Q6H PRN Ursula Alert, MD      . levothyroxine (SYNTHROID, LEVOTHROID) tablet 224 mcg  224 mcg Oral QHS Ursula Alert, MD   224 mcg at 02/27/15 2137  . magnesium hydroxide (MILK OF MAGNESIA) suspension 30 mL  30 mL Oral Daily PRN Delfin Gant, NP      . traZODone (DESYREL) tablet 100 mg  100 mg Oral QHS Delfin Gant, NP   100 mg at 02/27/15 2137    Lab Results:  Results for orders placed or performed during the hospital encounter of 02/25/15 (from the past 48 hour(s))  Urinalysis with microscopic     Status: Abnormal   Collection Time: 02/27/15  7:00 AM  Result Value Ref Range   Color, Urine YELLOW YELLOW   APPearance CLOUDY (A) CLEAR   Specific Gravity,  Urine 1.014 1.005 - 1.030   pH 5.5 5.0 - 8.0   Glucose, UA NEGATIVE NEGATIVE mg/dL   Hgb urine dipstick MODERATE (A) NEGATIVE   Bilirubin Urine NEGATIVE NEGATIVE   Ketones, ur NEGATIVE NEGATIVE mg/dL   Protein, ur NEGATIVE NEGATIVE mg/dL   Urobilinogen, UA 0.2 0.0 - 1.0 mg/dL   Nitrite NEGATIVE NEGATIVE   Leukocytes, UA NEGATIVE NEGATIVE   Urine-Other URINALYSIS PERFORMED ON SUPERNATANT     Comment: FIELD OBSCURED BY AMORPHOUS MATERIAL Performed at [facility]   Urine culture     Status: None   Collection Time: 02/27/15  7:00 AM  Result Value Ref Range   Specimen Description      URINE, CLEAN CATCH Performed at [facility]    Special Requests      Normal Performed at [place] 1 DAY Performed at [facility]    Report Status 02/28/2015 FINAL     Physical Findings: AIMS: Facial and Oral Movements Muscles of Facial Expression: None, normal Lips and Perioral Area: None, normal Jaw: None, normal Tongue: None, normal,Extremity Movements Upper (arms, wrists, hands, fingers): None, normal Lower (legs, knees, ankles, toes): None, normal, Trunk Movements Neck, shoulders, hips: None, normal, Overall Severity Severity of abnormal movements (highest score from questions above): None, normal Incapacitation due to abnormal movements: None, normal Patient's awareness of abnormal movements (rate only patient's report): No Awareness, Dental Status Current problems with teeth and/or dentures?: No Does patient usually wear dentures?: No  CIWA:  CIWA-Ar Total: 10 COWS:  COWS Total Score: 4  Assessment:  Mabrey Howland is a 34 y.o [place] American female who is single , lives in [place] , a Insurance underwriter at [employer], 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 Pt was  brought in by her mother and her friend [place] . Pt with pressured speech and labile affect .Pt continues to refuse medications other than Trazodone for sleep. Will observe on the unit.     Treatment Plan Summary: Daily contact with patient to assess and evaluate symptoms and progress in treatment and Medication management Discussed with pt about the need to start her on an antipsychotic as well as mood stabilizer . However pt is not open to discussion . Pt feels that her problems are solely because of her thyroid levels and her not being able to sleep. Will continue Trazodone 100 mg po qhs for sleep. Restarted  her home dose of levothyroxine 224 mcg at bedtime. Pt wants to take it at bedtime - states her provider agreed to it.TSH reviewed - wnl -0.887 (02/25/15) . 3 weeks ago - was 4.906 ( elevated) Pt also with UTI - started on Keflex 500 mg po bid .  Provide medication education , encourage pt to take her medication. Pt to be observed on the unit.Possible DC tomorrow if she continues to be stable with no adverse events.  Will continue to monitor vitals ,medication compliance and treatment side effects while patient is here.  Will monitor for medical issues as well as call consult as needed.  CSW will start working on disposition.  Patient to participate in therapeutic milieu .   Medical Decision Making:  Review of Psycho-Social Stressors (1), Review of Last Therapy Session (1) and Review of Medication Regimen & Side Effects (2)     Bryann Mcnealy MD 02/28/2015, 12:32 PM

## 2015-02-28 NOTE — BHH Group Notes (Signed)
[facility] LCSW Group Therapy  02/28/2015  1:05 PM  Type of Therapy:  Group therapy  Participation Level:  Active  Participation Quality:  Attentive  Affect:  Flat  Cognitive:  Oriented  Insight:  Limited  Engagement in Therapy:  Limited  Modes of Intervention:  Discussion, Socialization  Summary of Progress/Problems:  Chaplain was here to lead a group on themes of hope and courage. "I have lots of things I hope for.  I hope relationships with my family changes.  I hope the way the world is fighting will change.  My faith is very important to me.  But the thing about it is, if family is not there for you, there are other people who can give you hope besides them."  The end of this was feedback to another patient.  ""I'm feeling very hopeful going forward. I'm seeing a counselor, and she is helping me immensely."  Reiterated her commitment to not take meds. Roque Lias B 02/28/2015 1:28 PM

## 2015-02-28 NOTE — Progress Notes (Signed)
D: Patient pleasant and cooperative with staff and has a bright affect. Pt participated in [place] session.  A: Q 15 minute safety checks, reorient as needed, encourage staff/peer interaction, medication compliance and group participation. R: Pt compliant with medications. Pt denies SI or plans to harm herself.

## 2015-02-28 NOTE — Progress Notes (Signed)
Pt appears to be very argumentative. Another pt stated she wanted some ice water , and writer pointed to the pitcher of ice water and pt stated "I've never seen them clean that since I've been here". Pt was told we usually clean it at least 2 times a day that I know about and probably other times, but just because you don't see it does not mean it's not being done

## 2015-02-28 NOTE — Tx Team (Signed)
Interdisciplinary Treatment Plan Update (Adult)  Date:  02/28/2015   Time Reviewed:  4:27 PM   Progress in Treatment: Attending groups:No Participating in groups:  No Taking medication as prescribed:  Yes. Tolerating medication:  Yes. Family/Significant othe contact made:  Yes Patient understands diagnosis:  No  Limited insight Discussing patient identified problems/goals with staff:  Yes, see initial care plan. Medical problems stabilized or resolved:  Yes. Denies suicidal/homicidal ideation: Yes. Issues/concerns per patient self-inventory:  No. Other:  New problem(s) identified:  Discharge Plan or Barriers: return home, follow up outpt  Reason for Continuation of Hospitalization:   Comments:  Pt reports that she has no memory of what happened prior to coming to hospital. She remembers them as though they were dreams . Writer contacted Darlina Sicilian - mother along with patient on speaker phone. Per mother pt came back from orientation on Monday afternoon and was not sleeping right , not feeling well, so she stayed with mom that night. The next day AM , mother felt as though patient was not in her right state of mind . Pt there after started decompensating to the point that patient was not sleeping , not eating , talking to people from the past and unknown people - like her dead g.mother and a husband when she does not have a husband and also talking about " keeping her baby" when she is not even pregnant. Per mother pt had been having trouble sleeping for more than a month now. Pt was involved in a lot of activities and organizations and had trouble keeping up with these . Pt called her out pt provider and had asked for sleep medication (unknown) , which she was taking prn.   Trazodone trial for sleep.  Pt not interested in taking any other meds other than medical  Estimated length of stay: D/C Sat  New goal(s):  Review of initial/current patient goals per problem list:   Review of  initial/current patient goals per problem list:  1. Goal(s): Patient will participate in aftercare plan   Met: Yes   Target date: 3-5 days post admission date   As evidenced by: Patient will participate within aftercare plan AEB aftercare provider and housing plan at discharge being identified.  02/26/15: Pt plans to return home, follow up outpt.    6. Goal (s): Patient will demonstrate decreased signs of mania  * Met: Yes  * Target date: 3-5 days post admission date  * As evidenced by: Patient demonstrate decreased signs of mania AEB decreased mood instability and demonstration of stable mood  02/26/15 Pt not following up outpt, not taking meds, was suffering from lack of sleep, delusions 02/28/2015  No signs nor symptoms today.  Sleep good    Attendees: Patient:  02/28/2015 4:27 PM   Family:   02/28/2015 4:27 PM   Physician:  Ursula Alert, MD 02/28/2015 4:27 PM   Nursing:   Marcella Dubs, RN 02/28/2015 4:27 PM   CSW:    Roque Lias, [place]   02/28/2015 4:27 PM   Other:  02/28/2015 4:27 PM   Other:   02/28/2015 4:27 PM   Other:  Lars Pinks, Nurse CM 02/28/2015 4:27 PM   Other:  Lucinda Dell, Beverly Sessions TCT 02/28/2015 4:27 PM   Other:  Norberto Sorenson, [place]  02/28/2015 4:27 PM   Other:  02/28/2015 4:27 PM   Other:  02/28/2015 4:27 PM   Other:  02/28/2015 4:27 PM   Other:  02/28/2015 4:27 PM   Other:  02/28/2015  4:27 PM   Other:   02/28/2015 4:27 PM    Scribe for Treatment Team:   Trish Mage, 02/28/2015 4:27 PM

## 2015-02-28 NOTE — Progress Notes (Signed)
Pt was offered Vistaril to help her sleep, pt became argumentative . Pt appears to try to find any little thing to argue and fuss about. Pt stated " I don't take that, you're the first one to offer that to me". Pt was informed that it was a PRN medication only used to help her relax and sleep.

## 2015-03-01 DIAGNOSIS — E039 Hypothyroidism, unspecified: Secondary | ICD-10-CM | POA: Insufficient documentation

## 2015-03-01 MED ORDER — FLUTICASONE PROPIONATE 50 MCG/ACT NA SUSP: 2.0000 | Freq: Every day | NASAL | Status: DC

## 2015-03-01 MED ORDER — TRAZODONE HCL 100 MG PO TABS: 100.0000 mg | ORAL_TABLET | Freq: Every day | ORAL | Status: DC

## 2015-03-01 MED ORDER — ALBUTEROL SULFATE HFA 108 (90 BASE) MCG/ACT IN AERS
2.0000 | INHALATION_SPRAY | Freq: Four times a day (QID) | RESPIRATORY_TRACT | Status: DC | PRN
Start: 2015-03-01 — End: 2015-08-06

## 2015-03-01 MED ORDER — FLUOXETINE HCL 40 MG PO CAPS: 40.0000 mg | ORAL_CAPSULE | Freq: Every day | ORAL | Status: DC

## 2015-03-01 MED ORDER — BECLOMETHASONE DIPROPIONATE 40 MCG/ACT IN AERS: 1.0000 | INHALATION_SPRAY | Freq: Two times a day (BID) | RESPIRATORY_TRACT | Status: DC

## 2015-03-01 MED ORDER — CEPHALEXIN 500 MG PO CAPS: 500.0000 mg | ORAL_CAPSULE | Freq: Two times a day (BID) | ORAL | Status: DC

## 2015-03-01 MED ORDER — LEVOTHYROXINE SODIUM 112 MCG PO TABS: 224.0000 ug | ORAL_TABLET | Freq: Every day | ORAL | Status: DC

## 2015-03-01 MED ORDER — HYDROXYZINE HCL 25 MG PO TABS: 25.0000 mg | ORAL_TABLET | Freq: Four times a day (QID) | ORAL | Status: DC | PRN

## 2015-03-01 NOTE — Progress Notes (Signed)
Morning Wellness Group 0900  Patient present and appropriately interactive with peers and staff. Patient stated that her goal for the day was to grieve for a relative that has recently passed away. The patient also stated that another goal was to "become more practical" and to look at what she could accomplish realistically and in a healthy way.  The focus of this group is to educate the patient on the purpose and policies of crisis stabilization and provide a format to answer questions about their admission.  The group details unit policies and expectations of patients while admitted.

## 2015-03-01 NOTE — BHH Suicide Risk Assessment (Signed)
[facility] Discharge Suicide Risk Assessment   Demographic Factors:  NA  Total Time spent with patient: 30 minutes  Musculoskeletal: Strength & Muscle Tone: within normal limits Gait & Station: normal Patient leans: N/A  Psychiatric Specialty Exam: Physical Exam  Review of Systems  Psychiatric/Behavioral: Negative for hallucinations and substance abuse. The patient has insomnia (improved). The patient is not nervous/anxious.   All other systems reviewed and are negative.   Blood pressure 108/73, pulse 77, temperature 98 F (36.7 C), temperature source Oral, resp. rate 16, height 5' 6.25" (1.683 m), weight 108.863 kg (240 lb), last menstrual period 01/30/2015.Body mass index is 38.43 kg/(m^2).  General Appearance: Casual  Eye Contact::  Fair  Speech:  Clear and [id number]  Volume:  Normal  Mood:  Euthymic  Affect:  Appropriate  Thought Process:  Coherent  Orientation:  Full (Time, Place, and Person)  Thought Content:  WDL  Suicidal Thoughts:  No  Homicidal Thoughts:  No  Memory:  Immediate;   Fair Recent;   Fair Remote;   Fair  Judgement:  Fair  Insight:  Fair  Psychomotor Activity:  Normal  Concentration:  Fair  Recall:  [employer] of Knowledge:Fair  Language: Fair  Akathisia:  No  Handed:  Right  AIMS (if indicated):     Assets:  Communication Skills Desire for Improvement  Sleep:  Number of Hours: 5.5  Cognition: WNL  ADL's:  Intact   Have you used any form of tobacco in the last 30 days? (Cigarettes, Smokeless Tobacco, Cigars, and/or Pipes): Patient Refused Screening  Has this patient used any form of tobacco in the last 30 days? (Cigarettes, Smokeless Tobacco, Cigars, and/or Pipes) No  Mental Status Per Nursing Assessment::   On Admission:     Current Mental Status by Physician: pt denies SI/HI/AH/VH  Loss Factors: NA  Historical Factors: Impulsivity  Risk Reduction Factors:   Employed, Living with another person, especially a relative and Positive  social support  Continued Clinical Symptoms:  Previous Psychiatric Diagnoses and Treatments  Cognitive Features That Contribute To Risk:  Polarized thinking    Suicide Risk:  Minimal: No identifiable suicidal ideation.  Patients presenting with no risk factors but with morbid ruminations; may be classified as minimal risk based on the severity of the depressive symptoms  Principal Problem: Bipolar and related disorder due to another medical condition with manic or hypomanic-like episodes Discharge Diagnoses:  Patient Active Problem List   Diagnosis Date Noted  . Hypothyroidism [E03.9] 02/26/2015  . Bipolar and related disorder due to another medical condition with manic or hypomanic-like episodes [F06.33] 02/26/2015  . History of posttraumatic stress disorder (PTSD) [Z86.59] 02/26/2015  . Anemia [D64.9] 01/30/2015  . Keloid of skin [L91.0] 09/04/2014  . Preventative health care [Z00.00] 09/04/2014  . Hearing difficulty of both ears [H91.93] 09/04/2014  . CAP (community acquired pneumonia) [J18.9] 08/05/2014  . Influenza [J11.1] 07/31/2014  . Foot swelling [M79.89] 05/08/2014  . Bloody discharge from right nipple [N64.52] 12/17/2013  . Sleeping difficulty [G47.9] 11/26/2013  . Unspecified vitamin D deficiency [E55.9] 05/22/2013  . Galactorrhea [O92.6] 11/30/2011  . Preventive measure [Z41.8] 11/30/2011  . Hypothyroidism (acquired) [E03.9] 11/29/2011  . Posttraumatic stress disorder [F43.10] 06/17/2011  . Depression with anxiety [F41.8] 05/19/2011    Class: Present on Admission  . ANGIOEDEMA [T78.3XXA] 05/02/2008  . Obesity [E66.9] 09/08/2006  . RHINITIS, ALLERGIC [J30.9] 09/08/2006  . ASTHMA, INTERMITTENT [J45.909] 09/08/2006  . ECZEMA, ATOPIC DERMATITIS [L20.9] 09/08/2006    Follow-up Information  Follow up with [facility] On 03/06/2015.   Why:  Medication management appointment with Dr. Lajuana Ripple on Thursday August 25th at 11:15am. Please call  office if you need to reschedule appt.   Contact information:   [place]      Plan Of Care/Follow-up recommendations:  Activity:  No restrictions Diet:  regular Tests:  as needed Other:  follow up with after care as needed  Is patient on multiple antipsychotic therapies at discharge:  No   Has Patient had three or more failed trials of antipsychotic monotherapy by history:  No  Recommended Plan for Multiple Antipsychotic Therapies: NA    Worth Kober MD 03/01/2015, 8:45 AM

## 2015-03-01 NOTE — Progress Notes (Signed)
Pt discharged per MD orders; pt currently denies SI/HI and auditory/visual hallucinations; pt was given education by RN regarding follow-up appointments and medications and pt denied any questions or concerns about these instructions; pt had no belongings in hospital locker and was escorted to hospital lobby for discharge.

## 2015-03-02 NOTE — Discharge Summary (Signed)
Physician Discharge Summary Note  Patient:  Colleen Peck is an 34 y.o., female MRN:  [id number] DOB:  Apr 29, 1981 Patient phone:  [phone] (home)  Patient address:   [place] [address],  Total Time spent with patient: 45 minutes  Date of Admission:  02/25/2015 Date of Discharge:  03/02/2015  Reason for Admission:  Ouida Abeyta came in with altered mental status and insomnia.  Principal Problem: Bipolar and related disorder due to another medical condition with manic or hypomanic-like episodes Discharge Diagnoses: Patient Active Problem List   Diagnosis Date Noted  . Thyroid activity decreased [E03.9]   . Hypothyroidism [E03.9] 02/26/2015  . Bipolar and related disorder due to another medical condition with manic or hypomanic-like episodes [F06.33] 02/26/2015  . History of posttraumatic stress disorder (PTSD) [Z86.59] 02/26/2015  . Anemia [D64.9] 01/30/2015  . Keloid of skin [L91.0] 09/04/2014  . Preventative health care [Z00.00] 09/04/2014  . Hearing difficulty of both ears [H91.93] 09/04/2014  . CAP (community acquired pneumonia) [J18.9] 08/05/2014  . Influenza [J11.1] 07/31/2014  . Foot swelling [M79.89] 05/08/2014  . Bloody discharge from right nipple [N64.52] 12/17/2013  . Sleeping difficulty [G47.9] 11/26/2013  . Unspecified vitamin D deficiency [E55.9] 05/22/2013  . Galactorrhea [O92.6] 11/30/2011  . Preventive measure [Z41.8] 11/30/2011  . Hypothyroidism (acquired) [E03.9] 11/29/2011  . Posttraumatic stress disorder [F43.10] 06/17/2011  . Depression with anxiety [F41.8] 05/19/2011    Class: Present on Admission  . ANGIOEDEMA [T78.3XXA] 05/02/2008  . Obesity [E66.9] 09/08/2006  . RHINITIS, ALLERGIC [J30.9] 09/08/2006  . ASTHMA, INTERMITTENT [J45.909] 09/08/2006  . ECZEMA, ATOPIC DERMATITIS [L20.9] 09/08/2006    Musculoskeletal: Strength & Muscle Tone: within normal limits Gait & Station: normal Patient leans: N/A  Psychiatric Specialty  Exam: Physical Exam  Vitals reviewed. Psychiatric: Her mood appears not anxious. She does not exhibit a depressed mood.    Review of Systems  Psychiatric/Behavioral: Positive for depression.  All other systems reviewed and are negative.   Blood pressure 108/73, pulse 77, temperature 98 F (36.7 C), temperature source Oral, resp. rate 16, height 5' 6.25" (1.683 m), weight 108.863 kg (240 lb), last menstrual period 01/30/2015.Body mass index is 38.43 kg/(m^2).   General Appearance: CasualNeat  Eye Contact:: Fair  Speech: Clear and Coherent  Volume: Normal  Mood: Euthymic  Affect: Appropriate  Thought Process: Coherent  Orientation: Full (Time, Place, and Person)  Thought Content: WDL  Suicidal Thoughts: No  Homicidal Thoughts: No  Memory: Immediate; Fair Recent; Fair Remote; Fair  Judgement: Fair  Insight: Fair  Psychomotor Activity: Normal  Concentration: Fair  Recall: [employer] of Knowledge:Fair  Language: Fair  Akathisia: No  Handed: Right  AIMS (if indicated):    Assets: Communication Skills Desire for Improvement  Sleep: Number of Hours: 5.5  Cognition: WNL  ADL's: Intact       Have you used any form of tobacco in the last 30 days? (Cigarettes, Smokeless Tobacco, Cigars, and/or Pipes): Patient Refused Screening  Has this patient used any form of tobacco in the last 30 days? (Cigarettes, Smokeless Tobacco, Cigars, and/or Pipes) N/A  Past Medical History:  Past Medical History  Diagnosis Date  . Asthma   . Seasonal allergies   . Depression   . PTSD (post-traumatic stress disorder)   . Thyroid disease 2009    Graves disease (pt reported resolved); hypothyriodism  . Abnormal pap     pt reports abnl pap many years ago.  Nl since then.  . Palpitations 03/12/2008  Past Surgical History  Procedure Laterality Date  . Dilation and curettage of uterus  March 2006   Family History:  Family History  Problem  Relation Age of Onset  . Drug abuse Father   . Depression Maternal Aunt   . Depression Maternal Grandmother   . Anxiety disorder Maternal Grandmother   . COPD Maternal Grandmother   . Suicidality Cousin   . Depression Cousin   . Depression Maternal Aunt   . Hypertension Mother   . Diabetes Paternal Grandfather   . COPD Paternal Grandmother   . Heart disease Neg Hx    Social History:  History  Alcohol Use  . 0.6 oz/week  . 1 Glasses of wine per week    Comment: past use of alcohol in '08-'09     History  Drug Use  . Yes  . Special: Marijuana    Comment: past use of marijuana in '08-'09. occasional eats brownies w/ marijuana    Social History   Social History  . Marital Status: Single    Spouse Name: N/A  . Number of Children: N/A  . Years of Education: N/A   Social History Main Topics  . Smoking status: Never Smoker   . Smokeless tobacco: Never Used  . Alcohol Use: 0.6 oz/week    1 Glasses of wine per week     Comment: past use of alcohol in '08-'09  . Drug Use: Yes    Special: Marijuana     Comment: past use of marijuana in '08-'09. occasional eats brownies w/ marijuana  . Sexual Activity: Not Currently    Birth Control/ Protection: Abstinence   Other Topics Concern  . 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  Risk to Self: Is patient at risk for suicide?: No What has been your use of drugs/alcohol within the last 12 months?: Denies  Risk to Others:   Prior Inpatient Therapy:   Prior Outpatient Therapy:    Level of Care:  OP  Hospital Course:  Bonnie Overdorf, 34 y.o female had no recollection of reason for admission.  She had been diagnosed with MDD with psychosis and PTSD in 2013 per notes. Narissa Beaufort was admitted for Bipolar and related disorder due to another medical condition with manic or hypomanic-like episodes and crisis management.  She was treated discharged  with the medications listed below under Medication List.  Medical problems were identified and treated as needed.  Home medications were restarted as appropriate.  Improvement was monitored by observation and [name] daily report of symptom reduction.  Emotional and mental status was monitored by daily self-inventory reports completed by [name] and clinical staff.         Namiah Dunnavant was evaluated by the treatment team for stability and plans for continued recovery upon discharge.  Nikitia Asbill motivation was an integral factor for scheduling further treatment.  Employment, transportation, bed availability, health status, family support, and any pending legal issues were also considered during her hospital stay.  She was offered further treatment options upon discharge including but not limited to Residential, Intensive Outpatient, and Outpatient treatment.  Naina Sleeper will follow up with the services as listed below under Follow Up Information.     Upon completion of this admission the patient was both mentally and medically stable for discharge denying suicidal/homicidal ideation, auditory/visual/tactile hallucinations, delusional thoughts and paranoia.      Consults:  psychiatry  Significant Diagnostic Studies:  labs: per ED  Discharge Vitals:   Blood pressure 108/73, pulse 77, temperature 98 F (36.7 C), temperature source Oral, resp. rate 16, height 5' 6.25" (1.683 m), weight 108.863 kg (240 lb), last menstrual period 01/30/2015. Body mass index is 38.43 kg/(m^2). Lab Results:   No results found for this or any previous visit (from the past 72 hour(s)).  Physical Findings: AIMS: Facial and Oral Movements Muscles of Facial Expression: None, normal Lips and Perioral Area: None, normal Jaw: None, normal Tongue: None, normal,Extremity Movements Upper (arms, wrists, hands, fingers): None, normal Lower (legs, knees, ankles, toes): None, normal, Trunk  Movements Neck, shoulders, hips: None, normal, Overall Severity Severity of abnormal movements (highest score from questions above): None, normal Incapacitation due to abnormal movements: None, normal Patient's awareness of abnormal movements (rate only patient's report): No Awareness, Dental Status Current problems with teeth and/or dentures?: No Does patient usually wear dentures?: No  CIWA:  CIWA-Ar Total: 10 COWS:  COWS Total Score: 4   See Psychiatric Specialty Exam and Suicide Risk Assessment completed by Attending Physician prior to discharge.  Discharge destination:  Home  Is patient on multiple antipsychotic therapies at discharge:  No   Has Patient had three or more failed trials of antipsychotic monotherapy by history:  No  Recommended Plan for Multiple Antipsychotic Therapies: NA     Medication List    STOP taking these medications        HM MULTIVITAMIN ADULT GUMMY PO      TAKE these medications      Indication   albuterol 108 (90 BASE) MCG/ACT inhaler  Commonly known as:  PROVENTIL HFA  Inhale 2 puffs into the lungs every 6 (six) hours as needed. For shortness of breath, wheezing.   Indication:  Asthma, Chronic Obstructive Lung Disease     beclomethasone 40 MCG/ACT inhaler  Commonly known as:  QVAR  Inhale 1 puff into the lungs 2 (two) times daily.   Indication:  Asthma, Chronic Obstructive Lung Disease     cephALEXin 500 MG capsule  Commonly known as:  KEFLEX  Take 1 capsule (500 mg total) by mouth every 12 (twelve) hours.   Indication:  Urinary Tract Infection     FLUoxetine 40 MG capsule  Commonly known as:  PROZAC  Take 1 capsule (40 mg total) by mouth daily.   Indication:  Depression, Major Depressive Disorder     fluticasone 50 MCG/ACT nasal spray  Commonly known as:  FLONASE  Place 2 sprays into both nostrils daily.   Indication:  Hayfever     hydrOXYzine 25 MG tablet  Commonly known as:  ATARAX/VISTARIL  Take 1 tablet (25 mg total) by  mouth every 6 (six) hours as needed for anxiety.   Indication:  Anxiety Neurosis     levothyroxine 112 MCG tablet  Commonly known as:  SYNTHROID, LEVOTHROID  Take 2 tablets (224 mcg total) by mouth at bedtime.   Indication:  Underactive Thyroid     traZODone 100 MG tablet  Commonly known as:  DESYREL  Take 1 tablet (100 mg total) by mouth at bedtime.   Indication:  Trouble Sleeping           Follow-up Information    Follow up with [facility] On 03/06/2015.   Why:  Medication management appointment with Dr. Lajuana Ripple on Thursday August 25th at 11:15am. Please call office if you need to reschedule appt.   Contact information:   [place] [place] [phone]      Follow-up recommendations:  Activity:  as tol Diet:  as tol  Comments:  1.  Take all your medications as prescribed.              2.  Report any adverse side effects to outpatient provider.                       3.  Patient instructed to not use alcohol or illegal drugs while on prescription medicines.            4.  In the event of worsening symptoms, instructed patient to call 911, the crisis hotline or go to nearest emergency room for evaluation of symptoms.  Total Discharge Time: 40  min  Signed: Freda Munro May Katty Fretwell AGNP-BC 03/02/2015, 9:49 AM

## 2015-03-02 NOTE — BHH Counselor (Signed)
Clinical Social Work Note  Pt called stating that her letters to school and work were supposed to be for returns 03/10/15 instead of 03/03/15.  Letters were supplied to her, left at front desk.  Selmer Dominion, LCSW 03/02/2015, 4:31 PM

## 2015-03-02 NOTE — BHH Counselor (Signed)
Per discharged pt's request at her return to front desk, provided her with letters to return to work and school.  Selmer Dominion, LCSW 03/01/2015, 3:00

## 2015-03-06 ENCOUNTER — Ambulatory Visit (INDEPENDENT_AMBULATORY_CARE_PROVIDER_SITE_OTHER): Payer: 59 | Admitting: Family Medicine

## 2015-03-06 VITALS — BP 114/61 | HR 72 | Temp 98.3°F | Ht 66.0 in | Wt 246.3 lb

## 2015-03-06 DIAGNOSIS — Z658 Other specified problems related to psychosocial circumstances: Secondary | ICD-10-CM

## 2015-03-06 DIAGNOSIS — F439 Reaction to severe stress, unspecified: Secondary | ICD-10-CM

## 2015-03-06 NOTE — Patient Instructions (Addendum)
I am glad that you are doing better.  Plan to see me about 1 week after you see [name].  If you need anything or have any questions call the office at [phone].  Shariq Puig M. Lajuana Ripple, DO PGY-2, [place]

## 2015-03-06 NOTE — Progress Notes (Signed)
Patient ID: Colleen Peck, female   DOB: 12-21-1980, 34 y.o.   MRN: [id number]    Subjective: CC: hospital follow up HPI: Patient is a 34 y.o. female presenting to clinic today for follow up. Concerns today include:  1. Bipolar d/o with recent hospitalization at [facility] She was discharged on Prozac, Hydroxyzine and Trazodone.  Patient reports that she was not sent home on Prozac.  Had a recent passing of a very close family member.  She is upset and angry about her hospitalization.  She was told by her psychiatrist during hospitalization that she was not supposed to continue Prozac.  She has been taking Trazodone, Hydroxyzine and Synthroid.  Patient to see therapist/psychiatrist [name].  She is in [employer] [phone].  Patient is voices frustration over [facility] stay.  She states that a couple of RNs were not attentive and she felt that they did not do a good job.  She states that she is upset that her record shows any mention of possible Bipolar disorder and that her mother mistakenly stated that this ran in their family, when in fact she meant to say hypothyroidism.  She is extremely emotional regarding this being in her medical record.  She reports that initially she thought she was dreaming then realized she was hospitalized.  She reports that she cannot disclose everything to me but that a lot is going on in her life that is causing her to be upset and frustrated.  She voices that she feels overwhelmed by all that she is involved in and does not feel that people, incl family, are doing their parts to carry some of the load.  She reports that she is working full time, going to school full time, is involved in several groups incl [employer].  She reports that this week was supposed to be a relaxation week for her and she does not feel that she is able to relax because of all that she has going on.  FamHx and MedHx reviewed.  Please see EMR.  ROS: All other systems reviewed and are  negative.  Objective: Office vital signs reviewed. BP 114/61 mmHg  Pulse 72  Temp(Src) 98.3 F (36.8 C) (Oral)  Ht 5\' 6"  (1.676 m)  Wt 246 lb 4.8 oz (111.721 kg)  BMI 39.77 kg/m2  LMP 02/25/2015  Physical Examination:  General: Awake, alert, obese, NAD, tearful, arguing with mother on the phone upon my entry into the room HEENT: Normal, EOMI Cardio: Regular rate noted Pulm: normal WOB, no wheeze Psych: mood extremely labile, tearful, speech somewhat pressured, patient reiterates that she "is not crazy, but very pissed", no SI/HI  Assessment/Plan: 34 y.o. female   1. Stress, feel that patient is not a danger to herself or others.  She does have quite a bit of stress in her life and I am concerned that she is overworking herself.  We discussed that she needs to limit her responsibilities at this point.  She is to seek counseling with [name], her current therapist in [facility].  I will work on getting her records from this office and making sure that there is open comminication between our offices. -patient to follow up in 1 month or sooner if needed -return precautions discussed.  Total time spent with patient 35 minutes.  Greater than 50% of encounter spent in coordination of care/counseling.  Janora Norlander, DO PGY-2, [place]

## 2015-03-11 ENCOUNTER — Encounter: Payer: Self-pay | Admitting: Family Medicine

## 2015-03-11 ENCOUNTER — Ambulatory Visit (INDEPENDENT_AMBULATORY_CARE_PROVIDER_SITE_OTHER): Payer: 59 | Admitting: Family Medicine

## 2015-03-11 VITALS — BP 123/82 | HR 84 | Temp 98.1°F | Ht 66.0 in | Wt 248.3 lb

## 2015-03-11 DIAGNOSIS — Z658 Other specified problems related to psychosocial circumstances: Secondary | ICD-10-CM

## 2015-03-11 DIAGNOSIS — Z8659 Personal history of other mental and behavioral disorders: Secondary | ICD-10-CM | POA: Diagnosis not present

## 2015-03-11 DIAGNOSIS — F439 Reaction to severe stress, unspecified: Secondary | ICD-10-CM | POA: Insufficient documentation

## 2015-03-11 NOTE — Progress Notes (Signed)
Patient ID: Colleen Peck, female   DOB: 02/26/1981, 34 y.o.   MRN: [id number]    Subjective: CC: stress HPI: Patient is a 34 y.o. female presenting to clinic today for follow up. Concerns today include:  1. Stress/depression Patient reports difficulty sleeping.  Thinks she might have hay fever.  She reports dizziness the other day as well.   She reports that she is still tired.  Has motivation to do things.  Feeling better than last visit.  Saw Colleen Peck Thursday.  Next appt next week.  Carry Colleen Peck will be her new Psychiatrist [phone], here in [place].  Denies SI/HI, dizziness, CP, SOB.  Endorses occ heart palpitations.  Social History Reviewed: non smoker. FamHx and MedHx updated.  Please see EMR.  ROS: All other systems reviewed and are negative.  Objective: Office vital signs reviewed. BP 123/82 mmHg  Pulse 84  Temp(Src) 98.1 F (36.7 C) (Oral)  Ht 5\' 6"  (1.676 m)  Wt 248 lb 4.8 oz (112.628 kg)  BMI 40.10 kg/m2  LMP 02/25/2015  Physical Examination:  General: Awake, alert, obese female, NAD HEENT: Normal, MMM Cardio: RRR, S1S2 heard, no murmurs appreciated Pulm: CTAB, no wheezes, rhonchi or rales, normal WOB MSK: Normal gait and station Skin: dry, intact, no rashes or lesions Psych: mood stable, patient is pleasant and in much better spirits than last appt.  Speech normal, affect appropriate.  Thought content normal.  Assessment/ Plan: 34 y.o. female   No problem-specific assessment & plan notes found for this encounter.  Colleen Norlander, DO PGY-2, [place]

## 2015-03-11 NOTE — Patient Instructions (Addendum)
It was a pleasure seeing you today, Colleen Peck.  Information regarding what we discussed is included in this packet.  Please make an appointment to see me in 3 months or sooner if needed.  I will send your information to [facility] [phone]  Please feel free to call our office at [phone] if any questions or concerns arise.  Warm Regards, Ketzia Guzek M. Quran Vasco, DO  Stress Stress-related medical problems are becoming increasingly common. The body has a built-in physical response to stressful situations. Faced with pressure, challenge or danger, we need to react quickly. Our bodies release hormones such as cortisol and adrenaline to help do this. These hormones are part of the "fight or flight" response and affect the metabolic rate, heart rate and blood pressure, resulting in a heightened, stressed state that prepares the body for optimum performance in dealing with a stressful situation. It is likely that early man required these mechanisms to stay alive, but usually modern stresses do not call for this, and the same hormones released in today's world can damage health and reduce coping ability. CAUSES  Pressure to perform at work, at school or in sports.  Threats of physical violence.  Money worries.  Arguments.  Family conflicts.  Divorce or separation from significant other.  Bereavement.  New job or unemployment.  Changes in location.  Alcohol or drug abuse. SOMETIMES, THERE IS NO PARTICULAR REASON FOR DEVELOPING STRESS. Almost all people are at risk of being stressed at some time in their lives. It is important to know that some stress is temporary and some is long term.  Temporary stress will go away when a situation is resolved. Most people can cope with short periods of stress, and it can often be relieved by relaxing, taking a walk or getting any type of exercise, chatting through issues with friends, or having a good night's sleep.  Chronic  (long-term, continuous) stress is much harder to deal with. It can be psychologically and emotionally damaging. It can be harmful both for an individual and for friends and family. SYMPTOMS Everyone reacts to stress differently. There are some common effects that help [place] recognize it. In times of extreme stress, people may:  Shake uncontrollably.  Breathe faster and deeper than normal (hyperventilate).  Vomit.  For people with asthma, stress can trigger an attack.  For some people, stress may trigger migraine headaches, ulcers, and body pain. PHYSICAL EFFECTS OF STRESS MAY INCLUDE:  Loss of energy.  Skin problems.  Aches and pains resulting from tense muscles, including neck ache, backache and tension headaches.  Increased pain from arthritis and other conditions.  Irregular heart beat (palpitations).  Periods of irritability or anger.  Apathy or depression.  Anxiety (feeling uptight or worrying).  Unusual behavior.  Loss of appetite.  Comfort eating.  Lack of concentration.  Loss of, or decreased, sex-drive.  Increased smoking, drinking, or recreational drug use.  For women, missed periods.  Ulcers, joint pain, and muscle pain. Post-traumatic stress is the stress caused by any serious accident, strong emotional damage, or extremely difficult or violent experience such as rape or war. Post-traumatic stress victims can experience mixtures of emotions such as fear, shame, depression, guilt or anger. It may include recurrent memories or images that may be haunting. These feelings can last for weeks, months or even years after the traumatic event that triggered them. Specialized treatment, possibly with medicines and psychological therapies, is available. If stress is causing physical symptoms, severe distress or making it difficult  for you to function as normal, it is worth seeing your caregiver. It is important to remember that although stress is a usual part of life,  extreme or prolonged stress can lead to other illnesses that will need treatment. It is better to visit a doctor sooner rather than later. Stress has been linked to the development of high blood pressure and heart disease, as well as insomnia and depression. There is no diagnostic test for stress since everyone reacts to it differently. But a caregiver will be able to spot the physical symptoms, such as:  Headaches.  Shingles.  Ulcers. Emotional distress such as intense worry, low mood or irritability should be detected when the doctor asks pertinent questions to identify any underlying problems that might be the cause. In case there are physical reasons for the symptoms, the doctor may also want to do some tests to exclude certain conditions. If you feel that you are suffering from stress, try to identify the aspects of your life that are causing it. Sometimes you may not be able to change or avoid them, but even a small change can have a positive ripple effect. A simple lifestyle change can make all the difference. STRATEGIES THAT CAN HELP DEAL WITH STRESS:  Delegating or sharing responsibilities.  Avoiding confrontations.  Learning to be more assertive.  Regular exercise.  Avoid using alcohol or street drugs to cope.  Eating a healthy, balanced diet, rich in fruit and vegetables and proteins.  Finding humor or absurdity in stressful situations.  Never taking on more than you know you can handle comfortably.  Organizing your time better to get as much done as possible.  Talking to friends or family and sharing your thoughts and fears.  Listening to music or relaxation tapes.  Relaxation techniques like deep breathing, meditation, and yoga.  Tensing and then relaxing your muscles, starting at the toes and working up to the head and neck. If you think that you would benefit from help, either in identifying the things that are causing your stress or in learning techniques to help  you relax, see a caregiver who is capable of helping you with this. Rather than relying on medications, it is usually better to try and identify the things in your life that are causing stress and try to deal with them. There are many techniques of managing stress including counseling, psychotherapy, aromatherapy, yoga, and exercise. Your caregiver can help you determine what is best for you. Document Released: 09/18/2002 Document Revised: 07/03/2013 Document Reviewed: 08/15/2007 [facility] Patient Information 2015 [place], [place]. This information is not intended to replace advice given to you by your health care provider. Make sure you discuss any questions you have with your health care provider.

## 2015-03-11 NOTE — Assessment & Plan Note (Signed)
Patient seems much better today than last visit.  Mood was stable.  PHQ-9 was given to fill out but patient left 3 questions blank related to sleep, energy and rate of movement/speech.  She felt that these were being impacted by possible URI and did not want the assessment to be skewed.  Though, all three were positive today. -Patient to continue to see [name], therapist -Patient to schedule appt with new psychiatrist Dr [name] -Will send patient's records to psychiatrist.  Records release filled out today -Patient to follow up in 3 months or sooner if needed

## 2015-03-13 ENCOUNTER — Emergency Department (INDEPENDENT_AMBULATORY_CARE_PROVIDER_SITE_OTHER)
Admission: EM | Admit: 2015-03-13 | Discharge: 2015-03-13 | Disposition: A | Discharge status: Home / Self Care | Payer: 59 | Source: Home / Self Care | Attending: Emergency Medicine | Admitting: Emergency Medicine

## 2015-03-13 ENCOUNTER — Emergency Department (INDEPENDENT_AMBULATORY_CARE_PROVIDER_SITE_OTHER): Payer: 59

## 2015-03-13 ENCOUNTER — Encounter (HOSPITAL_COMMUNITY): Payer: Self-pay | Admitting: Emergency Medicine

## 2015-03-13 DIAGNOSIS — J40 Bronchitis, not specified as acute or chronic: Secondary | ICD-10-CM

## 2015-03-13 MED ORDER — PREDNISONE 50 MG PO TABS: ORAL_TABLET | ORAL | Status: DC

## 2015-03-13 MED ORDER — DOXYCYCLINE HYCLATE 100 MG PO CAPS: 100.0000 mg | ORAL_CAPSULE | Freq: Two times a day (BID) | ORAL | Status: DC

## 2015-03-13 NOTE — Discharge Instructions (Signed)
You have bronchitis. Take doxycycline and prednisone as prescribed. Use your albuterol every 4 hours as needed for wheezing or cough. Use QVAR 2 puffs TWICE a day. You should see improvement in the next 3-5 days. If you develop fevers, difficulty breathing, or are just not getting better, please come back or go to the emergency room.

## 2015-03-13 NOTE — ED Provider Notes (Signed)
CSN: [id number]     Arrival date & time 03/13/15  1910 History   First MD Initiated Contact with Patient 03/13/15 1943     Chief Complaint  Patient presents with  . URI   (Consider location/radiation/quality/duration/timing/severity/associated sxs/prior Treatment) HPI She is a 34 year old woman here for evaluation of cough and fever. Her symptoms started 5 days ago with postnasal drip and a cough. She has been taking Flonase, Zyrtec, Tylenol Cold and sinus. She states her symptoms started to get better yesterday, but worse again today. She reports nasal congestion to the point where she cannot breathe through her nose. She also reports cough, chest congestion, shortness of breath, subjective fevers.  She also reports decreased energy. She states she has had pneumonia multiple times in the last few years. She does have asthma, but takes her Qvar as prescribed.  Past Medical History  Diagnosis Date  . Asthma   . Seasonal allergies   . Depression   . PTSD (post-traumatic stress disorder)   . Thyroid disease 2009    Graves disease (pt reported resolved); hypothyriodism  . Abnormal pap     pt reports abnl pap many years ago.  Nl since then.  . Palpitations 03/12/2008   Past Surgical History  Procedure Laterality Date  . Dilation and curettage of uterus  March 2006   Family History  Problem Relation Age of Onset  . Drug abuse Father   . Depression Maternal Aunt   . Depression Maternal Grandmother   . Anxiety disorder Maternal Grandmother   . COPD Maternal Grandmother   . Suicidality Cousin   . Depression Cousin   . Depression Maternal Aunt   . Hypertension Mother   . Diabetes Paternal Grandfather   . COPD Paternal Grandmother   . Heart disease Neg Hx    Social History  Substance Use Topics  . Smoking status: Never Smoker   . Smokeless tobacco: Never Used  . Alcohol Use: 0.6 oz/week    1 Glasses of wine per week     Comment: past use of alcohol in '08-'09   OB History    No  data available     Review of Systems  Allergies  No known allergies  Home Medications   Prior to Admission medications   Medication Sig Start Date End Date Taking? Authorizing Provider  albuterol (PROVENTIL HFA) 108 (90 BASE) MCG/ACT inhaler Inhale 2 puffs into the lungs every 6 (six) hours as needed. For shortness of breath, wheezing. 03/01/15   Kerrie Buffalo, NP  beclomethasone (QVAR) 40 MCG/ACT inhaler Inhale 1 puff into the lungs 2 (two) times daily. 03/01/15   Kerrie Buffalo, NP  doxycycline (VIBRAMYCIN) 100 MG capsule Take 1 capsule (100 mg total) by mouth 2 (two) times daily. 03/13/15   Melony Overly, MD  fluticasone (FLONASE) 50 MCG/ACT nasal spray Place 2 sprays into both nostrils daily. 03/01/15   Kerrie Buffalo, NP  hydrOXYzine (ATARAX/VISTARIL) 25 MG tablet Take 1 tablet (25 mg total) by mouth every 6 (six) hours as needed for anxiety. 03/01/15   Kerrie Buffalo, NP  levothyroxine (SYNTHROID, LEVOTHROID) 112 MCG tablet Take 2 tablets (224 mcg total) by mouth at bedtime. 03/01/15   Kerrie Buffalo, NP  predniSONE (DELTASONE) 50 MG tablet Take 1 pill daily for 5 days. 03/13/15   Melony Overly, MD  traZODone (DESYREL) 100 MG tablet Take 1 tablet (100 mg total) by mouth at bedtime. 03/01/15   Kerrie Buffalo, NP   Meds Ordered and Administered this Visit  Medications - No data to display  BP 98/58 mmHg  Pulse 79  Temp(Src) 98 F (36.7 C) (Oral)  Resp 20  SpO2 100%  LMP 02/25/2015 No data found.   Physical Exam  Constitutional: She is oriented to person, place, and time. She appears well-developed and well-nourished. No distress.  HENT:  Nose: Nose normal.  Mouth/Throat: Oropharynx is clear and moist. No oropharyngeal exudate.  Neck: Neck supple.  Cardiovascular: Normal rate, regular rhythm and normal heart sounds.   No murmur heard. Pulmonary/Chest: Breath sounds normal. She is in respiratory distress (mildly short of breath, but able to speak in full sentences). She has no  wheezes. She has no rales.  Neurological: She is alert and oriented to person, place, and time.    ED Course  Procedures (including critical care time)  Labs Review Labs Reviewed - No data to display  Imaging Review Dg Chest 2 View  03/13/2015   CLINICAL DATA:  Cough, low-grade fever, chest congestion and right-sided chest pain  EXAM: CHEST  2 VIEW  COMPARISON:  08/03/2014  FINDINGS: The heart size and mediastinal contours are within normal limits. Both lungs are clear but hypoaerated. The visualized skeletal structures are unremarkable.  IMPRESSION: No active cardiopulmonary disease. If symptoms persist, consider PA and lateral chest radiographs obtained at full inspiration when the patient is clinically able.   Electronically Signed   By: Conchita Paris M.D.   On: 03/13/2015 20:51      MDM   1. Bronchitis    Treat with prednisone and doxycycline. Clarified her Qvar dose with her. She states at one point she was told to take 1 puff twice a day and another time 2 puffs once a day. I instructed her to take 2 puffs twice a day. She will use her albuterol inhaler as needed. Follow-up as needed.    Melony Overly, MD 03/13/15 [phone]

## 2015-03-13 NOTE — ED Notes (Signed)
Patient reports post nasal drip, hacking cough, feverish and sweating onset Sunday. Wednesday thought to be getting a little better Today has congestion in lungs , non-productive cough and fever.   Reports having pneumonia in the last few months and has had it yearly as well

## 2015-04-15 ENCOUNTER — Other Ambulatory Visit: Payer: Self-pay | Admitting: Family Medicine

## 2015-04-15 DIAGNOSIS — G479 Sleep disorder, unspecified: Secondary | ICD-10-CM

## 2015-04-15 DIAGNOSIS — J309 Allergic rhinitis, unspecified: Secondary | ICD-10-CM

## 2015-04-15 DIAGNOSIS — E039 Hypothyroidism, unspecified: Secondary | ICD-10-CM

## 2015-04-15 MED ORDER — TRAZODONE HCL 100 MG PO TABS: 100.0000 mg | ORAL_TABLET | Freq: Every day | ORAL | Status: DC

## 2015-04-15 MED ORDER — LEVOTHYROXINE SODIUM 112 MCG PO TABS: 224.0000 ug | ORAL_TABLET | Freq: Every day | ORAL | Status: DC

## 2015-04-15 MED ORDER — FLUTICASONE PROPIONATE 50 MCG/ACT NA SUSP: 2.0000 | Freq: Every day | NASAL | Status: DC

## 2015-04-15 NOTE — Telephone Encounter (Signed)
Needs refills on levothyroxine and trazadone---[facility] on [address] Would like written RX for flonase that she can pick up

## 2015-04-15 NOTE — Telephone Encounter (Signed)
Done.  Flonase placed up front for pick up.  Please advise patient that I have prescribed Synthroid x6 months.  We will need to check her thyroid sometime in the next few months.

## 2015-04-15 NOTE — Telephone Encounter (Signed)
LM for patient to call back with any questions regarding message left about refills. Colleen Peck,CMA

## 2015-04-22 ENCOUNTER — Telehealth: Payer: Self-pay | Admitting: Family Medicine

## 2015-04-22 NOTE — Telephone Encounter (Signed)
Pt called and needs a referral to see her therapist. She said the name of the therapist is in her file. She isn't sure if the therapist is in her network but wanted to see since she has been paying out of pocket. jw

## 2015-04-24 ENCOUNTER — Other Ambulatory Visit: Payer: Self-pay | Admitting: Family Medicine

## 2015-04-24 NOTE — Telephone Encounter (Signed)
Attempted to call back.  Patient is seeing "[name]" for therapy and Dr [name] for Psychiatry.  Please clarify if she needs referral to Psychiatrist vs Therapist.  If it is her therapist [name], please have her give the last name and location of [name] so that I can place this in epic.  Thanks.

## 2015-04-25 ENCOUNTER — Other Ambulatory Visit: Payer: Self-pay | Admitting: Family Medicine

## 2015-04-25 DIAGNOSIS — F418 Other specified anxiety disorders: Secondary | ICD-10-CM

## 2015-04-25 DIAGNOSIS — F0633 Mood disorder due to known physiological condition with manic features: Secondary | ICD-10-CM

## 2015-04-25 DIAGNOSIS — F4310 Post-traumatic stress disorder, unspecified: Secondary | ICD-10-CM

## 2015-04-25 NOTE — Telephone Encounter (Signed)
Spoke to pt. She needs a referral to [facility], [address], [place] [address] and also to [facility]. The other psychiatrist, Dr. [name] isn't accepting new patients.

## 2015-04-25 NOTE — Telephone Encounter (Signed)
Done,

## 2015-04-28 NOTE — Telephone Encounter (Signed)
LM on voicemail telling pt the referrals have been placed. Ottis Stain, CMA

## 2015-05-14 ENCOUNTER — Ambulatory Visit: Payer: Self-pay | Admitting: Family Medicine

## 2015-05-14 ENCOUNTER — Other Ambulatory Visit: Payer: Self-pay | Admitting: Family Medicine

## 2015-05-14 ENCOUNTER — Encounter (HOSPITAL_COMMUNITY): Payer: Self-pay | Admitting: Emergency Medicine

## 2015-05-14 ENCOUNTER — Emergency Department (INDEPENDENT_AMBULATORY_CARE_PROVIDER_SITE_OTHER)
Admission: EM | Admit: 2015-05-14 | Discharge: 2015-05-14 | Disposition: A | Discharge status: Home / Self Care | Payer: 59 | Source: Home / Self Care

## 2015-05-14 DIAGNOSIS — J40 Bronchitis, not specified as acute or chronic: Secondary | ICD-10-CM

## 2015-05-14 DIAGNOSIS — J4520 Mild intermittent asthma, uncomplicated: Secondary | ICD-10-CM

## 2015-05-14 MED ORDER — ALBUTEROL SULFATE (2.5 MG/3ML) 0.083% IN NEBU: 2.5000 mg | INHALATION_SOLUTION | Freq: Four times a day (QID) | RESPIRATORY_TRACT | Status: DC | PRN

## 2015-05-14 MED ORDER — BENZONATATE 100 MG PO CAPS: 200.0000 mg | ORAL_CAPSULE | Freq: Three times a day (TID) | ORAL | Status: DC | PRN

## 2015-05-14 MED ORDER — ALBUTEROL SULFATE (2.5 MG/3ML) 0.083% IN NEBU
INHALATION_SOLUTION | RESPIRATORY_TRACT | Status: AC
  Filled 2015-05-14: qty 3

## 2015-05-14 MED ORDER — AZITHROMYCIN 250 MG PO TABS: ORAL_TABLET | ORAL | Status: DC

## 2015-05-14 MED ORDER — ALBUTEROL SULFATE (2.5 MG/3ML) 0.083% IN NEBU
2.5000 mg | INHALATION_SOLUTION | RESPIRATORY_TRACT | Status: DC
  Administered 2015-05-14: 2.5 mg via RESPIRATORY_TRACT

## 2015-05-14 NOTE — Discharge Instructions (Signed)
Upper Respiratory Infection, Adult Most upper respiratory infections (URIs) are a viral infection of the air passages leading to the lungs. A URI affects the nose, throat, and upper air passages. The most common type of URI is nasopharyngitis and is typically referred to as "the common cold." URIs run their course and usually go away on their own. Most of the time, a URI does not require medical attention, but sometimes a bacterial infection in the upper airways can follow a viral infection. This is called a secondary infection. Sinus and middle ear infections are common types of secondary upper respiratory infections. Bacterial pneumonia can also complicate a URI. A URI can worsen asthma and chronic obstructive pulmonary disease (COPD). Sometimes, these complications can require emergency medical care and may be life threatening.  CAUSES Almost all URIs are caused by viruses. A virus is a type of germ and can spread from one person to another.  RISKS FACTORS You may be at risk for a URI if:   You smoke.   You have chronic heart or lung disease.  You have a weakened defense (immune) system.   You are very young or very old.   You have nasal allergies or asthma.  You work in crowded or poorly ventilated areas.  You work in health care facilities or schools. SIGNS AND SYMPTOMS  Symptoms typically develop 2-3 days after you come in contact with a cold virus. Most viral URIs last 7-10 days. However, viral URIs from the influenza virus (flu virus) can last 14-18 days and are typically more severe. Symptoms may include:   Runny or stuffy (congested) nose.   Sneezing.   Cough.   Sore throat.   Headache.   Fatigue.   Fever.   Loss of appetite.   Pain in your forehead, behind your eyes, and over your cheekbones (sinus pain).  Muscle aches.  DIAGNOSIS  Your health care provider may diagnose a URI by:  Physical exam.  Tests to check that your symptoms are not due to  another condition such as:  Strep throat.  Sinusitis.  Pneumonia.  Asthma. TREATMENT  A URI goes away on its own with time. It cannot be cured with medicines, but medicines may be prescribed or recommended to relieve symptoms. Medicines may help:  Reduce your fever.  Reduce your cough.  Relieve nasal congestion. HOME CARE INSTRUCTIONS   Take medicines only as directed by your health care provider.   Gargle warm saltwater or take cough drops to comfort your throat as directed by your health care provider.  Use a warm mist humidifier or inhale steam from a shower to increase air moisture. This may make it easier to breathe.  Drink enough fluid to keep your urine clear or pale yellow.   Eat soups and other clear broths and maintain good nutrition.   Rest as needed.   Return to work when your temperature has returned to normal or as your health care provider advises. You may need to stay home longer to avoid infecting others. You can also use a face mask and careful hand washing to prevent spread of the virus.  Increase the usage of your inhaler if you have asthma.   Do not use any tobacco products, including cigarettes, chewing tobacco, or electronic cigarettes. If you need help quitting, ask your health care provider. PREVENTION  The best way to protect yourself from getting a cold is to practice good hygiene.   Avoid oral or hand contact with people with cold   symptoms.   Wash your hands often if contact occurs.  There is no clear evidence that vitamin C, vitamin E, echinacea, or exercise reduces the chance of developing a cold. However, it is always recommended to get plenty of rest, exercise, and practice good nutrition.  SEEK MEDICAL CARE IF:   You are getting worse rather than better.   Your symptoms are not controlled by medicine.   You have chills.  You have worsening shortness of breath.  You have brown or red mucus.  You have yellow or brown nasal  discharge.  You have pain in your face, especially when you bend forward.  You have a fever.  You have swollen neck glands.  You have pain while swallowing.  You have white areas in the back of your throat. SEEK IMMEDIATE MEDICAL CARE IF:   You have severe or persistent:  Headache.  Ear pain.  Sinus pain.  Chest pain.  You have chronic lung disease and any of the following:  Wheezing.  Prolonged cough.  Coughing up blood.  A change in your usual mucus.  You have a stiff neck.  You have changes in your:  Vision.  Hearing.  Thinking.  Mood. MAKE SURE YOU:   Understand these instructions.  Will watch your condition.  Will get help right away if you are not doing well or get worse.   This information is not intended to replace advice given to you by your health care provider. Make sure you discuss any questions you have with your health care provider.   Document Released: 12/22/2000 Document Revised: 11/12/2014 Document Reviewed: 10/03/2013 Elsevier Interactive Patient Education 2016 Elsevier Inc.  

## 2015-05-14 NOTE — Telephone Encounter (Signed)
Pt is calling because her nebulizer is broken. She needs a new prescription for another one and also for the solution. Please send this to [facility] on [address] since this is close to her home and she can not drive at this time. jw

## 2015-05-14 NOTE — ED Provider Notes (Signed)
CSN: [id number]     Arrival date & time 05/14/15  1309 History   None    Chief Complaint  Patient presents with  . Influenza   (Consider location/radiation/quality/duration/timing/severity/associated sxs/prior Treatment) Patient is a 34 y.o. female presenting with flu symptoms. The history is provided by the patient.  Influenza Presenting symptoms: cough, fatigue, fever, rhinorrhea and shortness of breath   Severity:  Moderate Onset quality:  Sudden Duration:  3 days Progression:  Unchanged Chronicity:  New Relieved by:  Nothing Worsened by:  Nothing tried Associated symptoms: nasal congestion     Past Medical History  Diagnosis Date  . Asthma   . Seasonal allergies   . Depression   . PTSD (post-traumatic stress disorder)   . Thyroid disease 2009    Graves disease (pt reported resolved); hypothyriodism  . Abnormal pap     pt reports abnl pap many years ago.  Nl since then.  . Palpitations 03/12/2008   Past Surgical History  Procedure Laterality Date  . Dilation and curettage of uterus  March 2006   Family History  Problem Relation Age of Onset  . Drug abuse Father   . Depression Maternal Aunt   . Depression Maternal Grandmother   . Anxiety disorder Maternal Grandmother   . COPD Maternal Grandmother   . Suicidality Cousin   . Depression Cousin   . Depression Maternal Aunt   . Hypertension Mother   . Diabetes Paternal Grandfather   . COPD Paternal Grandmother   . Heart disease Neg Hx    Social History  Substance Use Topics  . Smoking status: Never Smoker   . Smokeless tobacco: Never Used  . Alcohol Use: 0.6 oz/week    1 Glasses of wine per week     Comment: past use of alcohol in '08-'09   OB History    No data available     Review of Systems  Constitutional: Positive for fever and fatigue.  HENT: Positive for congestion and rhinorrhea.   Eyes: Negative.   Respiratory: Positive for cough and shortness of breath.   Cardiovascular: Negative.    Gastrointestinal: Negative.   Endocrine: Negative.   Genitourinary: Negative.   Musculoskeletal: Negative.   Skin: Negative.   Allergic/Immunologic: Negative.   Neurological: Negative.   Hematological: Negative.   Psychiatric/Behavioral: Negative.     Allergies  No known allergies  Home Medications   Prior to Admission medications   Medication Sig Start Date End Date Taking? Authorizing Provider  albuterol (PROVENTIL HFA) 108 (90 BASE) MCG/ACT inhaler Inhale 2 puffs into the lungs every 6 (six) hours as needed. For shortness of breath, wheezing. 03/01/15   Kerrie Buffalo, NP  azithromycin (ZITHROMAX) 250 MG tablet Take 2 po first day and then one po qd x 4 days 05/14/15   Lysbeth Penner, FNP  beclomethasone (QVAR) 40 MCG/ACT inhaler Inhale 1 puff into the lungs 2 (two) times daily. 03/01/15   Kerrie Buffalo, NP  benzonatate (TESSALON) 100 MG capsule Take 2 capsules (200 mg total) by mouth 3 (three) times daily as needed for cough. 05/14/15   Lysbeth Penner, FNP  doxycycline (VIBRAMYCIN) 100 MG capsule Take 1 capsule (100 mg total) by mouth 2 (two) times daily. 03/13/15   Melony Overly, MD  fluticasone (FLONASE) 50 MCG/ACT nasal spray Place 2 sprays into both nostrils daily. 04/15/15   Ashly Windell Moulding, DO  hydrOXYzine (ATARAX/VISTARIL) 25 MG tablet Take 1 tablet (25 mg total) by mouth every 6 (six) hours as needed  for anxiety. 03/01/15   Kerrie Buffalo, NP  levothyroxine (SYNTHROID, LEVOTHROID) 112 MCG tablet Take 2 tablets (224 mcg total) by mouth at bedtime. 04/15/15   Ashly Windell Moulding, DO  predniSONE (DELTASONE) 50 MG tablet Take 1 pill daily for 5 days. 03/13/15   Melony Overly, MD  traZODone (DESYREL) 100 MG tablet Take 1 tablet (100 mg total) by mouth at bedtime. 04/15/15   Janora Norlander, DO   Meds Ordered and Administered this Visit   Medications  albuterol (PROVENTIL) (2.5 MG/3ML) 0.083% nebulizer solution 2.5 mg (not administered)    BP 132/80 mmHg  Pulse 84  Temp(Src)  98.2 F (36.8 C) (Oral)  Resp 20  SpO2 98% No data found.   Physical Exam  Constitutional: She appears well-developed and well-nourished.  HENT:  Head: Normocephalic and atraumatic.  Eyes: Conjunctivae and EOM are normal. Pupils are equal, round, and reactive to light.  Neck: Normal range of motion. Neck supple.  Cardiovascular: Normal rate and regular rhythm.   Pulmonary/Chest: Effort normal.  Bilateral Breath Sounds are diminished bilateral.  Musculoskeletal: Normal range of motion.    ED Course  Procedures (including critical care time)  Labs Review Labs Reviewed - No data to display  Imaging Review No results found.   Visual Acuity Review  Right Eye Distance:   Left Eye Distance:   Bilateral Distance:    Right Eye Near:   Left Eye Near:    Bilateral Near:         MDM   1. Bronchitis    Zpak as directed Tessalon Perles Neb tx Push po fluids, rest, follow up prn if sx's persist or continue.  Walters, FNP 05/14/15 [place], FNP 05/14/15 1430

## 2015-05-14 NOTE — ED Notes (Signed)
Discharge delayed secondary to breathing treatment administration

## 2015-05-14 NOTE — ED Notes (Signed)
Requesting script for nebulizer Complains of pnd, aching in general, coughing and sob

## 2015-05-14 NOTE — Telephone Encounter (Signed)
Pt coming to the office to pick up nebulizer, tamika taking care of it

## 2015-05-14 NOTE — ED Notes (Signed)
Patient dropped treatment circuit.  Obtained another circuit and treatment for patient

## 2015-05-14 NOTE — Telephone Encounter (Signed)
Pt is calling back because she gave me the wrong pharmacy. It is [facility]   on [address]. jw

## 2015-05-14 NOTE — Telephone Encounter (Signed)
Patient picked up nebulizer from nurse today.  Derl Barrow, RN

## 2015-05-23 ENCOUNTER — Ambulatory Visit: Payer: Self-pay | Admitting: Family Medicine

## 2015-05-27 ENCOUNTER — Encounter: Payer: Self-pay | Admitting: Family Medicine

## 2015-05-27 ENCOUNTER — Ambulatory Visit (INDEPENDENT_AMBULATORY_CARE_PROVIDER_SITE_OTHER): Payer: 59 | Admitting: Family Medicine

## 2015-05-27 VITALS — BP 128/82 | HR 83 | Temp 98.6°F | Ht 67.0 in | Wt 249.0 lb

## 2015-05-27 DIAGNOSIS — G479 Sleep disorder, unspecified: Secondary | ICD-10-CM | POA: Diagnosis not present

## 2015-05-27 DIAGNOSIS — F418 Other specified anxiety disorders: Secondary | ICD-10-CM

## 2015-05-27 MED ORDER — HYDROXYZINE HCL 25 MG PO TABS: 25.0000 mg | ORAL_TABLET | Freq: Four times a day (QID) | ORAL | Status: DC | PRN

## 2015-05-27 MED ORDER — TRAZODONE HCL 100 MG PO TABS: 100.0000 mg | ORAL_TABLET | Freq: Every day | ORAL | Status: DC

## 2015-05-27 NOTE — Patient Instructions (Signed)
We will fax your information over to Dr. Jimmye Norman office right now.  Take the Trazodone 50 mg at night (half a pill).  If this is still making you sleepy, try 1/4 pill.  Taking at least some of this on a consistent basis will help with your anxiety.  Do what you can to center yourself -- whether that's prayer, meditation, taking several deep breaths, or journaling.    It was good to meet you today.

## 2015-05-27 NOTE — Progress Notes (Signed)
Subjective:    Colleen Peck is a 34 y.o. female who presents to [facility] today for anxiety issues:  1.  Anxiety:  Patient has had increasing anxiety since the election last week.  This seems to be acute on chronic anxiety.  Also lots of issues with home and jobs and not having enough time to care for herself well  There have been lots of issues for trying to get the patient seen at a psychiatrist, mostly stemming from them not having any records from [place].  She has been tried on several medications in past.  Currently on trazodone but she hasn't been taking this.  No SI/HI.   Also with trouble sleeping due to racing thoughts.   ROS as above per HPI, otherwise neg.    The following portions of the patient's history were reviewed and updated as appropriate: allergies, current medications, past medical history, family and social history, and problem list. Patient is a nonsmoker.    PMH reviewed.  Past Medical History  Diagnosis Date  . Asthma   . Seasonal allergies   . Depression   . PTSD (post-traumatic stress disorder)   . Thyroid disease 2009    Graves disease (pt reported resolved); hypothyriodism  . Abnormal pap     pt reports abnl pap many years ago.  Nl since then.  . Palpitations 03/12/2008   Past Surgical History  Procedure Laterality Date  . Dilation and curettage of uterus  March 2006    Medications reviewed. Current Outpatient Prescriptions  Medication Sig Dispense Refill  . albuterol (PROVENTIL HFA) 108 (90 BASE) MCG/ACT inhaler Inhale 2 puffs into the lungs every 6 (six) hours as needed. For shortness of breath, wheezing. 1 Inhaler 1  . albuterol (PROVENTIL) (2.5 MG/3ML) 0.083% nebulizer solution Take 3 mLs (2.5 mg total) by nebulization every 6 (six) hours as needed for wheezing or shortness of breath. 150 mL 1  . azithromycin (ZITHROMAX) 250 MG tablet Take 2 po first day and then one po qd x 4 days 6 tablet 0  . beclomethasone (QVAR) 40 MCG/ACT inhaler Inhale 1 puff into  the lungs 2 (two) times daily. 1 Inhaler 12  . benzonatate (TESSALON) 100 MG capsule Take 2 capsules (200 mg total) by mouth 3 (three) times daily as needed for cough. 21 capsule 0  . doxycycline (VIBRAMYCIN) 100 MG capsule Take 1 capsule (100 mg total) by mouth 2 (two) times daily. (Patient not taking: Reported on 05/14/2015) 20 capsule 0  . fluticasone (FLONASE) 50 MCG/ACT nasal spray Place 2 sprays into both nostrils daily. 16 g 6  . hydrOXYzine (ATARAX/VISTARIL) 25 MG tablet Take 1 tablet (25 mg total) by mouth every 6 (six) hours as needed for anxiety. (Patient not taking: Reported on 05/14/2015) 30 tablet 0  . levothyroxine (SYNTHROID, LEVOTHROID) 112 MCG tablet Take 2 tablets (224 mcg total) by mouth at bedtime. 60 tablet 5  . predniSONE (DELTASONE) 50 MG tablet Take 1 pill daily for 5 days. 5 tablet 0  . traZODone (DESYREL) 100 MG tablet Take 1 tablet (100 mg total) by mouth at bedtime. 30 tablet 5  . [DISCONTINUED] benztropine (COGENTIN) 1 MG tablet Take 0.5 mg by mouth daily.     No current facility-administered medications for this visit.     Objective:   Physical Exam BP 128/82 mmHg  Pulse 83  Temp(Src) 98.6 F (37 C) (Oral)  Ht 5\' 7"  (1.702 m)  Wt 249 lb (112.946 kg)  BMI 38.99 kg/m2  LMP 04/22/2015 Gen:  Alert, cooperative patient who appears stated age in no acute distress.  Vital signs reviewed. Psych:  Mildly anxious appearing.  No pressured speech, hallucinations, signs of depression.   No results found for this or any previous visit (from the past 72 hour(s)).

## 2015-05-30 NOTE — Assessment & Plan Note (Signed)
Mostly anxiety today.  She is already seening a Social worker.   She has found a psychiatrist but is waiting on notes from our clinic.  Will fax these over.   Counseled to start Trazodone, even at lower dose if she is worried about grogginess on days she has to get up early for work. No red flags.  FU next week or sooner if worsening.  If unable to been seen by psych in next couple of weeks, FU with [place] first.

## 2015-06-02 ENCOUNTER — Telehealth: Payer: Self-pay | Admitting: *Deleted

## 2015-06-02 NOTE — Telephone Encounter (Signed)
2 recent office notes, and demographic sheet faxed to Dr. Jimmye Norman @Fax [phone], per Dr. Mingo Amber. Katharina Caper, Pharrell Ledford D, [place]

## 2015-06-25 ENCOUNTER — Telehealth: Payer: Self-pay | Admitting: Family Medicine

## 2015-06-25 NOTE — Telephone Encounter (Signed)
Has a nebulizer. Has used it 2 times and it no longer works.  It is the machine that isnt working.  Needs a new one

## 2015-06-27 NOTE — Telephone Encounter (Signed)
Patient came into office and wanted to know what she needs to do about her nebulizer not working.  Machine will not turn on after using it twice.  Contacted [employer] customer service/[phone].  Patient spoke with representative.  [employer] will mail patient box to return broken nebulizer back to them.  Asked that we give patient another nebulizer from our office and complete paperwork as if it is a new order, but will need to write "Neb swap/exchange" at top of paperwork.  Will complete and fax to [employer] on Monday after reviewing previous application and getting PCP signature.  Burna Forts, BSN, RN-BC

## 2015-06-30 NOTE — Telephone Encounter (Signed)
I looked in my box for this and could not find the document.  Levada Dy will be covering for me this week.  Please bring this to her attention if it has not already been done.

## 2015-07-01 ENCOUNTER — Ambulatory Visit (INDEPENDENT_AMBULATORY_CARE_PROVIDER_SITE_OTHER): Payer: 59 | Admitting: Licensed Clinical Social Worker

## 2015-07-01 DIAGNOSIS — F39 Unspecified mood [affective] disorder: Secondary | ICD-10-CM
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
## 2015-07-08 ENCOUNTER — Ambulatory Visit (INDEPENDENT_AMBULATORY_CARE_PROVIDER_SITE_OTHER): Payer: 59 | Admitting: Licensed Clinical Social Worker

## 2015-07-08 DIAGNOSIS — F39 Unspecified mood [affective] disorder: Secondary | ICD-10-CM | POA: Diagnosis not present

## 2015-07-16 NOTE — Telephone Encounter (Signed)
Dr. Daiva Nakayama disregard request to sign new nebulizer form.  Per Delana Meyer at [facility] rep)--they should still have patient's info on file and will not need new application.  Burna Forts, BSN, RN-BC

## 2015-07-16 NOTE — Progress Notes (Signed)
   THERAPIST PROGRESS NOTE  Session Time: 71min  Participation Level: Active  Behavioral Response: Casual and NeatAlertDepressed  Type of Therapy: Individual Therapy  Treatment Goals addressed: Coping  Interventions: CBT, Motivational Interviewing, Solution Focused, Strength-based, Supportive, Family Systems and Reframing  Summary: Colleen Peck is a 35 y.o. female who presents with continued symptoms of her diagnosis.  Explored stressors.  Explored relationships that are in good and poor standing and was able to list reasoning.  Discussed coping mechanisms that she currently uses and why she is unable to cope with daily life stressors.  Discussion on previous hospitalizations.  Discussion on upcoming holiday plans and how she can remain safe.   Suicidal/Homicidal: Nowithout intent/plan  Therapist Response:LCSW provided Patient with ongoing emotional support and encouragement.  Normalized her feelings.  Commended Patient on her progress and reinforced the importance of client staying focused on her own strengths and resources and resiliency. Processed various strategies for dealing with stressors.    Plan: Return again in 2 weeks.  Diagnosis: Axis I: Mood Disorder NOS    Axis II: No diagnosis    Lubertha South 07/16/2015

## 2015-07-18 ENCOUNTER — Ambulatory Visit: Payer: 59 | Admitting: Licensed Clinical Social Worker

## 2015-07-22 ENCOUNTER — Encounter (HOSPITAL_COMMUNITY): Payer: Self-pay | Admitting: *Deleted

## 2015-07-22 ENCOUNTER — Emergency Department (INDEPENDENT_AMBULATORY_CARE_PROVIDER_SITE_OTHER)
Admission: EM | Admit: 2015-07-22 | Discharge: 2015-07-22 | Disposition: A | Discharge status: Home / Self Care | Payer: BLUE CROSS/BLUE SHIELD | Source: Home / Self Care | Attending: Emergency Medicine | Admitting: Emergency Medicine

## 2015-07-22 DIAGNOSIS — J4531 Mild persistent asthma with (acute) exacerbation: Secondary | ICD-10-CM

## 2015-07-22 DIAGNOSIS — J069 Acute upper respiratory infection, unspecified: Secondary | ICD-10-CM | POA: Diagnosis not present

## 2015-07-22 MED ORDER — PREDNISONE 20 MG PO TABS: ORAL_TABLET | ORAL | Status: DC

## 2015-07-22 MED ORDER — TRIAMCINOLONE ACETONIDE 40 MG/ML IJ SUSP
INTRAMUSCULAR | Status: AC
  Filled 2015-07-22: qty 1

## 2015-07-22 MED ORDER — TRIAMCINOLONE ACETONIDE 40 MG/ML IJ SUSP
40.0000 mg | Freq: Once | INTRAMUSCULAR | Status: AC
  Administered 2015-07-22: 40 mg via INTRAMUSCULAR

## 2015-07-22 MED ORDER — ALBUTEROL SULFATE (2.5 MG/3ML) 0.083% IN NEBU
INHALATION_SOLUTION | RESPIRATORY_TRACT | Status: AC
  Filled 2015-07-22: qty 3

## 2015-07-22 MED ORDER — IPRATROPIUM-ALBUTEROL 0.5-2.5 (3) MG/3ML IN SOLN
RESPIRATORY_TRACT | Status: AC
  Filled 2015-07-22: qty 3

## 2015-07-22 MED ORDER — IPRATROPIUM-ALBUTEROL 0.5-2.5 (3) MG/3ML IN SOLN
3.0000 mL | Freq: Once | RESPIRATORY_TRACT | Status: AC
  Administered 2015-07-22: 3 mL via RESPIRATORY_TRACT

## 2015-07-22 MED ORDER — ALBUTEROL SULFATE (2.5 MG/3ML) 0.083% IN NEBU
2.5000 mg | INHALATION_SOLUTION | Freq: Once | RESPIRATORY_TRACT | Status: AC
  Administered 2015-07-22: 2.5 mg via RESPIRATORY_TRACT

## 2015-07-22 NOTE — ED Notes (Signed)
Pt  reportes   She  Wants  To  Be  Checked  For    hantovirus        Pulmonary  Syndrome   She  States  She  Was  Living in a  Building  With   Dead  Rodents  And  Feces  Back  In November   She  States  She  Has  Had  resp  Problems  Since          She  Stated  She  Was  Treated  For pnuemonia  At  That time   -  She  Reports  As   Well  That           She  Has  Been on  Amoxicillin   For  4  Days   She  Reports  A  Burning  Sensation in  Chest  She  States   She  Has  A  History  Of  Asthma

## 2015-07-22 NOTE — Discharge Instructions (Signed)
Asthma, Acute Bronchospasm Start her prednisone taper dose tomorrow. User albuterol nebulizer every 4-6 hours as needed for cough and wheeze Acute bronchospasm caused by asthma is also referred to as an asthma attack. Bronchospasm means your air passages become narrowed. The narrowing is caused by inflammation and tightening of the muscles in the air tubes (bronchi) in your lungs. This can make it hard to breathe or cause you to wheeze and cough. CAUSES Possible triggers are:  Animal dander from the skin, hair, or feathers of animals.  Dust mites contained in house dust.  Cockroaches.  Pollen from trees or grass.  Mold.  Cigarette or tobacco smoke.  Air pollutants such as dust, household cleaners, hair sprays, aerosol sprays, paint fumes, strong chemicals, or strong odors.  Cold air or weather changes. Cold air may trigger inflammation. Winds increase molds and pollens in the air.  Strong emotions such as crying or laughing hard.  Stress.  Certain medicines such as aspirin or beta-blockers.  Sulfites in foods and drinks, such as dried fruits and wine.  Infections or inflammatory conditions, such as a flu, cold, or inflammation of the nasal membranes (rhinitis).  Gastroesophageal reflux disease (GERD). GERD is a condition where stomach acid backs up into your esophagus.  Exercise or strenuous activity. SIGNS AND SYMPTOMS   Wheezing.  Excessive coughing, particularly at night.  Chest tightness.  Shortness of breath. DIAGNOSIS  Your health care provider will ask you about your medical history and perform a physical exam. A chest X-ray or blood testing may be performed to look for other causes of your symptoms or other conditions that may have triggered your asthma attack. TREATMENT  Treatment is aimed at reducing inflammation and opening up the airways in your lungs. Most asthma attacks are treated with inhaled medicines. These include quick relief or rescue medicines  (such as bronchodilators) and controller medicines (such as inhaled corticosteroids). These medicines are sometimes given through an inhaler or a nebulizer. Systemic steroid medicine taken by mouth or given through an IV tube also can be used to reduce the inflammation when an attack is moderate or severe. Antibiotic medicines are only used if a bacterial infection is present.  HOME CARE INSTRUCTIONS   Rest.  Drink plenty of liquids. This helps the mucus to remain thin and be easily coughed up. Only use caffeine in moderation and do not use alcohol until you have recovered from your illness.  Do not smoke. Avoid being exposed to secondhand smoke.  You play a critical role in keeping yourself in good health. Avoid exposure to things that cause you to wheeze or to have breathing problems.  Keep your medicines up-to-date and available. Carefully follow your health care provider's treatment plan.  Take your medicine exactly as prescribed.  When pollen or pollution is bad, keep windows closed and use an air conditioner or go to places with air conditioning.  Asthma requires careful medical care. See your health care provider for a follow-up as advised. If you are more than [redacted] weeks pregnant and you were prescribed any new medicines, let your obstetrician know about the visit and how you are doing. Follow up with your health care provider as directed.  After you have recovered from your asthma attack, make an appointment with your outpatient doctor to talk about ways to reduce the likelihood of future attacks. If you do not have a doctor who manages your asthma, make an appointment with a primary care doctor to discuss your asthma. [place]  CARE IF:   You are getting worse.  You have trouble breathing. If severe, call your local emergency services (911 in the [place]  You develop chest pain or discomfort.  You are vomiting.  You are not able to keep fluids down.  You are  coughing up yellow, green, brown, or bloody sputum.  You have a fever and your symptoms suddenly get worse.  You have trouble swallowing. MAKE SURE YOU:   Understand these instructions.  Will watch your condition.  Will get help right away if you are not doing well or get worse.   This information is not intended to replace advice given to you by your health care provider. Make sure you discuss any questions you have with your health care provider.   Document Released: 10/13/2006 Document Revised: 07/03/2013 Document Reviewed: 01/03/2013 Elsevier Interactive Patient Education 2016 Elsevier Inc.  Upper Respiratory Infection, Adult For drainage may take Zyrtec or Claritin. Use saline nasal spray frequently. Tylenol every 4 hours as needed. Drink any fluids stay well-hydrated Most upper respiratory infections (URIs) are a viral infection of the air passages leading to the lungs. A URI affects the nose, throat, and upper air passages. The most common type of URI is nasopharyngitis and is typically referred to as "the common cold." URIs run their course and usually go away on their own. Most of the time, a URI does not require medical attention, but sometimes a bacterial infection in the upper airways can follow a viral infection. This is called a secondary infection. Sinus and middle ear infections are common types of secondary upper respiratory infections. Bacterial pneumonia can also complicate a URI. A URI can worsen asthma and chronic obstructive pulmonary disease (COPD). Sometimes, these complications can require emergency medical care and may be life threatening.  CAUSES Almost all URIs are caused by viruses. A virus is a type of germ and can spread from one person to another.  RISKS FACTORS You may be at risk for a URI if:   You smoke.   You have chronic heart or lung disease.  You have a weakened defense (immune) system.   You are very young or very old.   You have  nasal allergies or asthma.  You work in crowded or poorly ventilated areas.  You work in health care facilities or schools. SIGNS AND SYMPTOMS  Symptoms typically develop 2-3 days after you come in contact with a cold virus. Most viral URIs last 7-10 days. However, viral URIs from the influenza virus (flu virus) can last 14-18 days and are typically more severe. Symptoms may include:   Runny or stuffy (congested) nose.   Sneezing.   Cough.   Sore throat.   Headache.   Fatigue.   Fever.   Loss of appetite.   Pain in your forehead, behind your eyes, and over your cheekbones (sinus pain).  Muscle aches.  DIAGNOSIS  Your health care provider may diagnose a URI by:  Physical exam.  Tests to check that your symptoms are not due to another condition such as:  Strep throat.  Sinusitis.  Pneumonia.  Asthma. TREATMENT  A URI goes away on its own with time. It cannot be cured with medicines, but medicines may be prescribed or recommended to relieve symptoms. Medicines may help:  Reduce your fever.  Reduce your cough.  Relieve nasal congestion. HOME CARE INSTRUCTIONS   Take medicines only as directed by your health care provider.   Gargle warm saltwater or take cough drops to  comfort your throat as directed by your health care provider.  Use a warm mist humidifier or inhale steam from a shower to increase air moisture. This may make it easier to breathe.  Drink enough fluid to keep your urine clear or pale yellow.   Eat soups and other clear broths and maintain good nutrition.   Rest as needed.   Return to work when your temperature has returned to normal or as your health care provider advises. You may need to stay home longer to avoid infecting others. You can also use a face mask and careful hand washing to prevent spread of the virus.  Increase the usage of your inhaler if you have asthma.   Do not use any tobacco products, including  cigarettes, chewing tobacco, or electronic cigarettes. If you need help quitting, ask your health care provider. PREVENTION  The best way to protect yourself from getting a cold is to practice good hygiene.   Avoid oral or hand contact with people with cold symptoms.   Wash your hands often if contact occurs.  There is no clear evidence that vitamin C, vitamin E, echinacea, or exercise reduces the chance of developing a cold. However, it is always recommended to get plenty of rest, exercise, and practice good nutrition.  SEEK MEDICAL CARE IF:   You are getting worse rather than better.   Your symptoms are not controlled by medicine.   You have chills.  You have worsening shortness of breath.  You have brown or red mucus.  You have yellow or brown nasal discharge.  You have pain in your face, especially when you bend forward.  You have a fever.  You have swollen neck glands.  You have pain while swallowing.  You have white areas in the back of your throat. SEEK IMMEDIATE MEDICAL CARE IF:   You have severe or persistent:  Headache.  Ear pain.  Sinus pain.  Chest pain.  You have chronic lung disease and any of the following:  Wheezing.  Prolonged cough.  Coughing up blood.  A change in your usual mucus.  You have a stiff neck.  You have changes in your:  Vision.  Hearing.  Thinking.  Mood. MAKE SURE YOU:   Understand these instructions.  Will watch your condition.  Will get help right away if you are not doing well or get worse.   This information is not intended to replace advice given to you by your health care provider. Make sure you discuss any questions you have with your health care provider.   Document Released: 12/22/2000 Document Revised: 11/12/2014 Document Reviewed: 10/03/2013 Elsevier Interactive Patient Education [employer].

## 2015-07-22 NOTE — ED Provider Notes (Signed)
CSN: [id number]     Arrival date & time 07/22/15  1937 History   First MD Initiated Contact with Patient 07/22/15 2027     Chief Complaint  Patient presents with  . Shortness of Breath   (Consider location/radiation/quality/duration/timing/severity/associated sxs/prior Treatment) HPI Comments: 35 year old morbidly obese female with a history of asthma is complaining of breathing problems, sore throat which is getting better, problems with her voice which is getting better and burning when taking a deep breath. She also getting a story of dead mice and  Rat feces strewn throughout her house. She is concerned about the hantavirus. Apparently 4 days ago she was utilizing the AV provider service and was administered amoxicillin for unknown type respiratory infection. She is currently taking that.  Her last nebulizer treatment was approximate 6 hours ago.    Past Medical History  Diagnosis Date  . Asthma   . Seasonal allergies   . Depression   . PTSD (post-traumatic stress disorder)   . Thyroid disease 2009    Graves disease (pt reported resolved); hypothyriodism  . Abnormal pap     pt reports abnl pap many years ago.  Nl since then.  . Palpitations 03/12/2008   Past Surgical History  Procedure Laterality Date  . Dilation and curettage of uterus  March 2006   Family History  Problem Relation Age of Onset  . Drug abuse Father   . Depression Maternal Aunt   . Depression Maternal Grandmother   . Anxiety disorder Maternal Grandmother   . COPD Maternal Grandmother   . Suicidality Cousin   . Depression Cousin   . Depression Maternal Aunt   . Hypertension Mother   . Diabetes Paternal Grandfather   . COPD Paternal Grandmother   . Heart disease Neg Hx    Social History  Substance Use Topics  . Smoking status: Never Smoker   . Smokeless tobacco: Never Used  . Alcohol Use: 0.6 oz/week    1 Glasses of wine per week     Comment: past use of alcohol in '08-'09   OB History    No data  available     Review of Systems  Constitutional: Positive for activity change. Negative for fever and fatigue.  HENT: Positive for congestion, postnasal drip, sore throat and voice change.   Respiratory: Positive for cough and shortness of breath.   Cardiovascular:       Burning in chest upon breathing but no heaviness, tightness, fullness or pressure.  Gastrointestinal: Negative.   Genitourinary: Negative.     Allergies  No known allergies  Home Medications   Prior to Admission medications   Medication Sig Start Date End Date Taking? Authorizing Provider  albuterol (PROVENTIL HFA) 108 (90 BASE) MCG/ACT inhaler Inhale 2 puffs into the lungs every 6 (six) hours as needed. For shortness of breath, wheezing. 03/01/15   Kerrie Buffalo, NP  albuterol (PROVENTIL) (2.5 MG/3ML) 0.083% nebulizer solution Take 3 mLs (2.5 mg total) by nebulization every 6 (six) hours as needed for wheezing or shortness of breath. 05/14/15   Ashly Windell Moulding, DO  beclomethasone (QVAR) 40 MCG/ACT inhaler Inhale 1 puff into the lungs 2 (two) times daily. 03/01/15   Kerrie Buffalo, NP  benzonatate (TESSALON) 100 MG capsule Take 2 capsules (200 mg total) by mouth 3 (three) times daily as needed for cough. 05/14/15   Lysbeth Penner, FNP  fluticasone (FLONASE) 50 MCG/ACT nasal spray Place 2 sprays into both nostrils daily. 04/15/15   Janora Norlander, DO  hydrOXYzine (ATARAX/VISTARIL) 25 MG tablet Take 1 tablet (25 mg total) by mouth every 6 (six) hours as needed for anxiety. 05/27/15   Alveda Reasons, MD  levothyroxine (SYNTHROID, LEVOTHROID) 112 MCG tablet Take 2 tablets (224 mcg total) by mouth at bedtime. 04/15/15   Ashly Windell Moulding, DO  predniSONE (DELTASONE) 20 MG tablet 3 Tabs PO Days 1-3, then 2 tabs PO Days 4-6, then 1 tab PO Day 7-9, then Half Tab PO Day 10-12 07/22/15   Janne Napoleon, NP  traZODone (DESYREL) 100 MG tablet Take 1 tablet (100 mg total) by mouth at bedtime. 05/27/15   Alveda Reasons, MD   Meds  Ordered and Administered this Visit   Medications  ipratropium-albuterol (DUONEB) 0.5-2.5 (3) MG/3ML nebulizer solution 3 mL (not administered)  albuterol (PROVENTIL) (2.5 MG/3ML) 0.083% nebulizer solution 2.5 mg (not administered)  triamcinolone acetonide (KENALOG-40) injection 40 mg (not administered)    BP 122/85 mmHg  Pulse 85  Temp(Src) 99.1 F (37.3 C) (Oral)  Resp 16  SpO2 99%  LMP 07/22/2015 No data found.   Physical Exam  Constitutional: She appears well-developed and well-nourished. No distress.  HENT:  Bilateral TMs are normal Oropharynx with minor erythema. No current drainage or exudates.  Eyes: EOM are normal.  Neck: Normal range of motion. Neck supple.  Cardiovascular: Normal rate, regular rhythm and normal heart sounds.   Pulmonary/Chest: Effort normal.  Prolonged expiratory phase. Distant wheezes with expiration. Call spasms with deep inspiration.  Musculoskeletal: She exhibits no edema.  Lymphadenopathy:    She has no cervical adenopathy.  Neurological: She is alert. No cranial nerve deficit. She exhibits normal muscle tone.  Skin: Skin is warm and dry.  Nursing note and vitals reviewed.   ED Course  Procedures (including critical care time)  Labs Review Labs Reviewed - No data to display  Imaging Review No results found.   Visual Acuity Review  Right Eye Distance:   Left Eye Distance:   Bilateral Distance:    Right Eye Near:   Left Eye Near:    Bilateral Near:         MDM   1. Asthma exacerbation attacks, mild persistent   2. URI (upper respiratory infection)    Kenalog 40 mg IM here DuoNeb 5 mg/2.5 mg. Post DuoNeb patient states she feels that she is breathing easier and better and with less wheezing. For drainage may take Zyrtec or Claritin. Use saline nasal spray frequently. Tylenol every 4 hours as needed. Drink any fluids stay well-hydrated Start prednisone taper dose tomorrow. User albuterol nebulizer every 4-6 hours as  needed for cough and wheeze For concerns about mold or other health issues regarding your living environment contact the health department.     Janne Napoleon, NP 07/22/15 2102

## 2015-08-06 ENCOUNTER — Ambulatory Visit (INDEPENDENT_AMBULATORY_CARE_PROVIDER_SITE_OTHER): Payer: Self-pay | Admitting: Family Medicine

## 2015-08-06 ENCOUNTER — Encounter: Payer: Self-pay | Admitting: Family Medicine

## 2015-08-06 ENCOUNTER — Telehealth: Payer: Self-pay | Admitting: *Deleted

## 2015-08-06 VITALS — BP 135/94 | HR 93 | Temp 98.1°F | Wt 259.5 lb

## 2015-08-06 DIAGNOSIS — N9089 Other specified noninflammatory disorders of vulva and perineum: Secondary | ICD-10-CM

## 2015-08-06 DIAGNOSIS — J4540 Moderate persistent asthma, uncomplicated: Secondary | ICD-10-CM

## 2015-08-06 DIAGNOSIS — F39 Unspecified mood [affective] disorder: Secondary | ICD-10-CM

## 2015-08-06 MED ORDER — ALBUTEROL SULFATE HFA 108 (90 BASE) MCG/ACT IN AERS: 2.0000 | INHALATION_SPRAY | Freq: Four times a day (QID) | RESPIRATORY_TRACT | Status: DC | PRN

## 2015-08-06 NOTE — Progress Notes (Signed)
    Subjective: CC: asthma, vaginal HPI: Kelee Dees is a 35 y.o. female presenting to clinic today for follow up visit. Concerns today include:  1. Asthma She notes that she was seen by UC for asthma.  She notes that she was on Amox via teledoc for a bit but was instead given a steroid injection, which helped.  She notes there are mice, roaches and mold in her apartment.  She has called the city commissioner and no changes have been made to her apartment.  She notes that she feels like breathing is tighter and has burning in her lungs.  No fevers.  Endorses cough and wheezing.   Was using Qvar 2 puffs 3-4 times daily.  Using nebulizer q4 hours at night time.  Ran out of Qvar this am.  She is planning on moving to another apartment in the next week.  2. Mood disorder Patient reports that she is stressed out because school has "dismissed" her.  She notes that she is seeing her counselor, Elmyra Ricks, and things are going well.  She notes that she is scheduled to see her psychiatrist soon.  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  She reports that things are going well otherwise.  She is excited about her upcoming move. No SI/HI.  3. Vaginal spot She notes that she has a spot on the labia that has been present for at least 1 month.  No exudate, no bleeding, no pain. She reports cramping.  Not sexually active.  No concern for STI or pregnancy.  Social History Reviewed: non smoker. FamHx and MedHx reviewed.  Please see EMR.  ROS: Per HPI  Objective: Office vital signs reviewed. BP 135/94 mmHg  Pulse 93  Temp(Src) 98.1 F (36.7 C) (Oral)  Wt 259 lb 8 oz (117.708 kg)  LMP 07/22/2015  Physical Examination:  General: Awake, alert, obese, No acute distress HEENT: Normal, MMM Cardio: regular rate and rhythm, S1S2 heard, no murmurs appreciated Pulm: slightly decreased air movement in bases, otherwise clear to auscultation bilaterally, no wheezes, rhonchi or  rales GU: +keloid scarring on mons pubis, 1-62mm flat blue/ gray lesion at the 9 o'clock position lateral to the labia minora , no vaginal bleeding, +slight fishy odor Psych: mood stable, speech normal, good eye contact  Assessment/ Plan: 35 y.o. female   1. Asthma, moderate persistent, uncomplicated.  No wheezes on exam today but slightly tight in bases. - Albuterol neb in house.  Patient responded well to this - Recommend Antihistamine - Discussed proper use of Qvar.  Patient is overusing - Patient to use Albuterol 4 puff q4 for next 48 hours then as needed. - Patient uninsured.  Would like to start Singulair when able. - Return precautions reviewed  2. Episodic mood disorder ([place]) - Continue counseling as scheduled - Patient has appt with psychiatrist for medication management - Patient aware of suicide hotline  3. Vulvar lesion.  Looks like a blue nevi but seems like a new lesion per patient's recollection.  Therefore, must r/o malignancy - Will need biopsy of site - Ambulatory referral to Gynecology   Janora Norlander, DO PGY-2, [place]

## 2015-08-06 NOTE — Telephone Encounter (Signed)
LVM for pt to call back to discuss below. Zimmerman Rumple, Jacqeline Broers D, CMA  

## 2015-08-06 NOTE — Patient Instructions (Addendum)
For the next 48 hours, you may use the albuterol 4 puffs every 4 hours.  Then use only as needed.  Use your Qvar only twice daily as directed.  If you continue to have shortness of breath after moving out of your apartment, come back for evaluation or go to the ED.  Plan to see me back in 2 months and we will get your vaccinations ready for your trip to [place].  GREEN = GO!                                   Use these medications every day!  - Breathing is good  - No cough or wheeze day or night  - Can work, sleep, exercise  Rinse your mouth after inhalers as directed Q-Var 34mcg 2 puffs twice per day Use 15 minutes before exercise or trigger exposure  Albuterol (Proventil, Ventolin, Proair) 2 puffs as needed every 4 hours    YELLOW = asthma out of control   Continue to use Green Zone medicines & add:  - Cough or wheeze  - Tight chest  - Short of breath  - Difficulty breathing  - First sign of a cold (be aware of your symptoms)  Call for advice as you need to.  Quick Relief Medicine:Albuterol (Proventil, Ventolin, Proair) 2 puffs as needed every 4 hours If you improve within 20 minutes, continue to use every 4 hours as needed until completely well. Call if you are not better in 2 days or you want more advice.  If no improvement in 15-20 minutes, repeat quick relief medicine every 20 minutes for 2 more treatments (for a maximum of 3 total treatments in 1 hour). If improved continue to use every 4 hours and CALL for advice.  If not improved or you are getting worse, follow Red Zone plan.  Special Instructions:   RED = DANGER                                Get help from a doctor now!  - Albuterol not helping or not lasting 4 hours  - Frequent, severe cough  - Getting worse instead of better  - Ribs or neck muscles show when breathing in  - Hard to walk and talk  - Lips or fingernails turn blue TAKE: Albuterol 4 puffs of inhaler with spacer If breathing is better within 15 minutes, repeat  emergency medicine every 15 minutes for 2 more doses. YOU MUST CALL FOR ADVICE NOW!   STOP! MEDICAL ALERT!  If still in Red (Danger) zone after 15 minutes this could be a life-threatening emergency. Take second dose of quick relief medicine  AND  Go to the Emergency Room or call 911  If you have trouble walking or talking, are gasping for air, or have blue lips or fingernails, CALL 911!I  "Continue albuterol treatments every 4 hours for the next 48 hours    Environmental Control and Control of other Triggers  Allergens  Animal Dander Some people are allergic to the flakes of skin or dried saliva from animals with fur or feathers. The best thing to do: . Keep furred or feathered pets out of your home.   If you can't keep the pet outdoors, then: . Keep the pet out of your bedroom and other sleeping areas at all times, and keep the door closed.  SCHEDULE FOLLOW-UP APPOINTMENT WITHIN 3-5 DAYS OR FOLLOWUP ON DATE PROVIDED IN YOUR DISCHARGE INSTRUCTIONS *Do not delete this statement* . Remove carpets and furniture covered with cloth from your home.   If that is not possible, keep the pet away from fabric-covered furniture   and carpets.  Dust Mites Many people with asthma are allergic to dust mites. Dust mites are tiny bugs that are found in every home-in mattresses, pillows, carpets, upholstered furniture, bedcovers, clothes, stuffed toys, and fabric or other fabric-covered items. Things that can help: . Encase your mattress in a special dust-proof cover. . Encase your pillow in a special dust-proof cover or wash the pillow each week in hot water. Water must be hotter than 130 F to kill the mites. Cold or warm water used with detergent and bleach can also be effective. . Wash the sheets and blankets on your bed each week in hot water. . Reduce indoor humidity to below 60 percent (ideally between 30-50 percent). Dehumidifiers or central air conditioners can do this. . Try not to  sleep or lie on cloth-covered cushions. . Remove carpets from your bedroom and those laid on concrete, if you can. Marland Kitchen Keep stuffed toys out of the bed or wash the toys weekly in hot water or   cooler water with detergent and bleach.  Cockroaches Many people with asthma are allergic to the dried droppings and remains of cockroaches. The best thing to do: . Keep food and garbage in closed containers. Never leave food out. . Use poison baits, powders, gels, or paste (for example, boric acid).   You can also use traps. . If a spray is used to kill roaches, stay out of the room until the odor   goes away.  Indoor Mold . Fix leaky faucets, pipes, or other sources of water that have mold   around them. . Clean moldy surfaces with a cleaner that has bleach in it.   Pollen and Outdoor Mold  What to do during your allergy season (when pollen or mold spore counts are high) . Try to keep your windows closed. . Stay indoors with windows closed from late morning to afternoon,   if you can. Pollen and some mold spore counts are highest at that time. . Ask your doctor whether you need to take or increase anti-inflammatory   medicine before your allergy season starts.  Irritants  Tobacco Smoke . If you smoke, ask your doctor for ways to help you quit. Ask family   members to quit smoking, too. . Do not allow smoking in your home or car.  Smoke, Strong Odors, and Sprays . If possible, do not use a wood-burning stove, kerosene heater, or fireplace. . Try to stay away from strong odors and sprays, such as perfume, talcum    powder, hair spray, and paints.  Other things that bring on asthma symptoms in some people include:  Vacuum Cleaning . Try to get someone else to vacuum for you once or twice a week,   if you can. Stay out of rooms while they are being vacuumed and for   a short while afterward. . If you vacuum, use a dust mask (from a hardware store), a double-layered   or microfilter  vacuum cleaner bag, or a vacuum cleaner with a HEPA filter.  Other Things That Can Make Asthma Worse . Sulfites in foods and beverages: Do not drink beer or wine or eat dried   fruit, processed potatoes, or shrimp if they cause  asthma symptoms. . Cold air: Cover your nose and mouth with a scarf on cold or windy days. . Other medicines: Tell your doctor about all the medicines you take.   Include cold medicines, aspirin, vitamins and other supplements, and   nonselective beta-blockers (including those in eye drops).  I have reviewed the asthma action plan with the patient and caregiver(s) and provided them with a copy.  Ronnie Doss

## 2015-08-06 NOTE — Telephone Encounter (Signed)
-----   Message from Janora Norlander, DO sent at 08/06/2015  9:35 AM EST ----- Can we call this patient?  She is scheduled this afternoon for pap.  She is not due for pap until Feb 2019.  Just want to clarify what she is actually coming in for.  Thanks.

## 2015-08-07 MED ORDER — ALBUTEROL SULFATE (2.5 MG/3ML) 0.083% IN NEBU
2.5000 mg | INHALATION_SOLUTION | Freq: Once | RESPIRATORY_TRACT | Status: AC
  Administered 2015-08-06: 2.5 mg via RESPIRATORY_TRACT

## 2015-08-07 NOTE — Addendum Note (Signed)
Addended by: Katharina Caper, Armstead Heiland D on: 08/07/2015 12:11 PM   Modules accepted: Orders

## 2015-08-11 ENCOUNTER — Ambulatory Visit: Payer: 59 | Admitting: Licensed Clinical Social Worker

## 2015-08-14 ENCOUNTER — Ambulatory Visit: Payer: 59 | Admitting: Licensed Clinical Social Worker

## 2015-08-17 ENCOUNTER — Emergency Department (HOSPITAL_COMMUNITY)
Admission: EM | Admit: 2015-08-17 | Discharge: 2015-08-18 | Disposition: A | Discharge status: Other Acute Inpatient Hospital | Payer: BLUE CROSS/BLUE SHIELD | Attending: Emergency Medicine | Admitting: Emergency Medicine

## 2015-08-17 ENCOUNTER — Encounter (HOSPITAL_COMMUNITY): Payer: Self-pay

## 2015-08-17 DIAGNOSIS — J45909 Unspecified asthma, uncomplicated: Secondary | ICD-10-CM | POA: Diagnosis not present

## 2015-08-17 DIAGNOSIS — Z79899 Other long term (current) drug therapy: Secondary | ICD-10-CM | POA: Insufficient documentation

## 2015-08-17 DIAGNOSIS — Z046 Encounter for general psychiatric examination, requested by authority: Secondary | ICD-10-CM | POA: Diagnosis present

## 2015-08-17 DIAGNOSIS — F332 Major depressive disorder, recurrent severe without psychotic features: Secondary | ICD-10-CM | POA: Diagnosis not present

## 2015-08-17 DIAGNOSIS — R109 Unspecified abdominal pain: Secondary | ICD-10-CM | POA: Insufficient documentation

## 2015-08-17 DIAGNOSIS — R45851 Suicidal ideations: Secondary | ICD-10-CM

## 2015-08-17 DIAGNOSIS — E039 Hypothyroidism, unspecified: Secondary | ICD-10-CM | POA: Diagnosis not present

## 2015-08-17 DIAGNOSIS — Z3202 Encounter for pregnancy test, result negative: Secondary | ICD-10-CM | POA: Insufficient documentation

## 2015-08-17 DIAGNOSIS — Z7951 Long term (current) use of inhaled steroids: Secondary | ICD-10-CM | POA: Diagnosis not present

## 2015-08-17 LAB — POC URINE PREG, ED: Preg Test, Ur: NEGATIVE

## 2015-08-17 NOTE — ED Notes (Addendum)
Pt BIB law enforcement stating she has been experiencing SI with a plan to overdose on medication. Pt states she has been experiencing 3/10 right sided flank pain, vaginal itching, and increased urinary frequency. Pt denies n/v or diarrhea. Pt A+OX4, speaking in complete sentences. Pt denies HI. Pt denies ingesting illegal substances or drugs within the last 72hrs.

## 2015-08-18 ENCOUNTER — Inpatient Hospital Stay (HOSPITAL_COMMUNITY)
Admission: AD | Admit: 2015-08-18 | Discharge: 2015-08-25 | DRG: 882 | Disposition: A | Discharge status: Home / Self Care | Payer: Federal, State, Local not specified - Other | Source: Intra-hospital | Attending: Psychiatry | Admitting: Psychiatry

## 2015-08-18 ENCOUNTER — Encounter (HOSPITAL_COMMUNITY): Payer: Self-pay

## 2015-08-18 DIAGNOSIS — F4310 Post-traumatic stress disorder, unspecified: Secondary | ICD-10-CM | POA: Diagnosis present

## 2015-08-18 DIAGNOSIS — F332 Major depressive disorder, recurrent severe without psychotic features: Secondary | ICD-10-CM | POA: Diagnosis not present

## 2015-08-18 DIAGNOSIS — F411 Generalized anxiety disorder: Secondary | ICD-10-CM | POA: Diagnosis not present

## 2015-08-18 DIAGNOSIS — R45851 Suicidal ideations: Secondary | ICD-10-CM

## 2015-08-18 LAB — COMPREHENSIVE METABOLIC PANEL
ALT: 23 U/L (ref 14–54)
AST: 18 U/L (ref 15–41)
Albumin: 4.3 g/dL (ref 3.5–5.0)
Alkaline Phosphatase: 56 U/L (ref 38–126)
Anion gap: 11 (ref 5–15)
BUN: 7 mg/dL (ref 6–20)
CHLORIDE: 106 mmol/L (ref 101–111)
CO2: 24 mmol/L (ref 22–32)
Calcium: 9.7 mg/dL (ref 8.9–10.3)
Creatinine, Ser: 0.91 mg/dL (ref 0.44–1.00)
Glucose, Bld: 102 mg/dL — ABNORMAL HIGH (ref 65–99)
POTASSIUM: 3.5 mmol/L (ref 3.5–5.1)
Sodium: 141 mmol/L (ref 135–145)
Total Bilirubin: 0.8 mg/dL (ref 0.3–1.2)
Total Protein: 7.9 g/dL (ref 6.5–8.1)

## 2015-08-18 LAB — URINE MICROSCOPIC-ADD ON: RBC / HPF: NONE SEEN RBC/hpf (ref 0–5)

## 2015-08-18 LAB — RAPID URINE DRUG SCREEN, HOSP PERFORMED
AMPHETAMINES: NOT DETECTED
BENZODIAZEPINES: NOT DETECTED
Barbiturates: NOT DETECTED
COCAINE: NOT DETECTED
Opiates: NOT DETECTED
Tetrahydrocannabinol: NOT DETECTED

## 2015-08-18 LAB — CBC
HEMATOCRIT: 34 % — AB (ref 36.0–46.0)
Hemoglobin: 10.7 g/dL — ABNORMAL LOW (ref 12.0–15.0)
MCH: 25.7 pg — ABNORMAL LOW (ref 26.0–34.0)
MCHC: 31.5 g/dL (ref 30.0–36.0)
MCV: 81.7 fL (ref 78.0–100.0)
PLATELETS: 293 10*3/uL (ref 150–400)
RBC: 4.16 MIL/uL (ref 3.87–5.11)
RDW: 15.2 % (ref 11.5–15.5)
WBC: 8 10*3/uL (ref 4.0–10.5)

## 2015-08-18 LAB — ACETAMINOPHEN LEVEL

## 2015-08-18 LAB — URINALYSIS, ROUTINE W REFLEX MICROSCOPIC
GLUCOSE, UA: NEGATIVE mg/dL
Hgb urine dipstick: NEGATIVE
KETONES UR: 40 mg/dL — AB
NITRITE: NEGATIVE
PROTEIN: NEGATIVE mg/dL
Specific Gravity, Urine: 1.026 (ref 1.005–1.030)
pH: 5 (ref 5.0–8.0)

## 2015-08-18 LAB — SALICYLATE LEVEL

## 2015-08-18 LAB — ETHANOL

## 2015-08-18 MED ORDER — CITALOPRAM HYDROBROMIDE 20 MG PO TABS
20.0000 mg | ORAL_TABLET | Freq: Every day | ORAL | Status: DC
  Filled 2015-08-18: qty 1

## 2015-08-18 MED ORDER — HYDROXYZINE HCL 25 MG PO TABS: 25.0000 mg | ORAL_TABLET | Freq: Four times a day (QID) | ORAL | Status: DC | PRN

## 2015-08-18 MED ORDER — TRAZODONE HCL 100 MG PO TABS: 100.0000 mg | ORAL_TABLET | Freq: Every day | ORAL | Status: DC

## 2015-08-18 MED ORDER — HYDROXYZINE HCL 25 MG PO TABS
25.0000 mg | ORAL_TABLET | Freq: Four times a day (QID) | ORAL | Status: DC | PRN
  Administered 2015-08-18 – 2015-08-24 (×8): 25 mg via ORAL
  Filled 2015-08-18: qty 1
  Filled 2015-08-18: qty 10
  Filled 2015-08-18 (×8): qty 1

## 2015-08-18 MED ORDER — ACETAMINOPHEN 325 MG PO TABS: 650.0000 mg | ORAL_TABLET | ORAL | Status: DC | PRN

## 2015-08-18 MED ORDER — MAGNESIUM HYDROXIDE 400 MG/5ML PO SUSP
30.0000 mL | Freq: Every day | ORAL | Status: DC | PRN
  Administered 2015-08-21: 30 mL via ORAL
  Filled 2015-08-18: qty 30

## 2015-08-18 MED ORDER — ALBUTEROL SULFATE HFA 108 (90 BASE) MCG/ACT IN AERS
2.0000 | INHALATION_SPRAY | Freq: Four times a day (QID) | RESPIRATORY_TRACT | Status: DC | PRN
  Administered 2015-08-20: 2 via RESPIRATORY_TRACT
  Filled 2015-08-18: qty 6.7

## 2015-08-18 MED ORDER — FLUTICASONE PROPIONATE HFA 44 MCG/ACT IN AERO: 2.0000 | INHALATION_SPRAY | Freq: Two times a day (BID) | RESPIRATORY_TRACT | Status: DC

## 2015-08-18 MED ORDER — ALBUTEROL SULFATE HFA 108 (90 BASE) MCG/ACT IN AERS: 2.0000 | INHALATION_SPRAY | Freq: Four times a day (QID) | RESPIRATORY_TRACT | Status: DC | PRN

## 2015-08-18 MED ORDER — HYDROXYZINE HCL 25 MG PO TABS
25.0000 mg | ORAL_TABLET | Freq: Three times a day (TID) | ORAL | Status: DC
  Filled 2015-08-18: qty 1

## 2015-08-18 MED ORDER — BECLOMETHASONE DIPROPIONATE 40 MCG/ACT IN AERS: 1.0000 | INHALATION_SPRAY | Freq: Two times a day (BID) | RESPIRATORY_TRACT | Status: DC

## 2015-08-18 MED ORDER — CEPHALEXIN 500 MG PO CAPS
500.0000 mg | ORAL_CAPSULE | Freq: Three times a day (TID) | ORAL | Status: DC
  Administered 2015-08-18: 500 mg via ORAL
  Filled 2015-08-18: qty 1

## 2015-08-18 MED ORDER — TRAZODONE HCL 100 MG PO TABS
100.0000 mg | ORAL_TABLET | Freq: Every day | ORAL | Status: DC
  Administered 2015-08-18: 100 mg via ORAL
  Filled 2015-08-18 (×3): qty 1

## 2015-08-18 MED ORDER — ALUM & MAG HYDROXIDE-SIMETH 200-200-20 MG/5ML PO SUSP: 30.0000 mL | ORAL | Status: DC | PRN

## 2015-08-18 MED ORDER — ADULT MULTIVITAMIN W/MINERALS CH
1.0000 | ORAL_TABLET | Freq: Every day | ORAL | Status: DC
  Administered 2015-08-18: 1 via ORAL
  Filled 2015-08-18: qty 1

## 2015-08-18 MED ORDER — ACETAMINOPHEN 325 MG PO TABS
650.0000 mg | ORAL_TABLET | Freq: Four times a day (QID) | ORAL | Status: DC | PRN
  Administered 2015-08-20: 650 mg via ORAL
  Filled 2015-08-18: qty 2

## 2015-08-18 MED ORDER — FERROUS SULFATE 325 (65 FE) MG PO TABS
325.0000 mg | ORAL_TABLET | Freq: Every day | ORAL | Status: DC
  Filled 2015-08-18: qty 1

## 2015-08-18 MED ORDER — LEVOTHYROXINE SODIUM 112 MCG PO TABS
224.0000 ug | ORAL_TABLET | Freq: Every day | ORAL | Status: DC
  Administered 2015-08-18 – 2015-08-24 (×7): 224 ug via ORAL
  Filled 2015-08-18 (×11): qty 2

## 2015-08-18 MED ORDER — LEVOTHYROXINE SODIUM 112 MCG PO TABS
224.0000 ug | ORAL_TABLET | Freq: Every day | ORAL | Status: DC
  Filled 2015-08-18: qty 2
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
## 2015-08-18 NOTE — Progress Notes (Signed)
Adult Psychoeducational Group Note  Date:  08/18/2015 Time:  9:22 PM  Group Topic/Focus:  Wrap-Up Group:   The focus of this group is to help patients review their daily goal of treatment and discuss progress on daily workbooks.  Participation Level:  Active  Participation Quality:  Appropriate  Affect:  Appropriate  Cognitive:  Appropriate  Insight: Appropriate  Engagement in Group:  Engaged  Modes of Intervention:  Discussion  Additional Comments: The patient expressed that she attended group.The patient also said that she a had a good day.  Nash Shearer 08/18/2015, 9:22 PM

## 2015-08-18 NOTE — Progress Notes (Signed)
Patient ID: Colleen Peck, female   DOB: June 09, 1981, 35 y.o.   MRN: [id number] Patient admitted due to increased depression and passive death wishes.  Patient currently denies SI, HI and AVH.  Patient reports over the last couple of days she has had decreased sleep and increased symptoms of psychosis including AVH and delusional thoughts.  Patient reports that these symptoms has decreased since admission and she has been able to sleep. Patient acknowledges understanding of treatment agreement and unit expectations.  Patient was oriented to unit without any incident. Belongings and safety search was complete.  Patient able to contract for safety.

## 2015-08-18 NOTE — ED Notes (Signed)
Bed: [facility] Expected date:  Expected time:  Means of arrival:  Comments: RM 17

## 2015-08-18 NOTE — ED Notes (Addendum)
Pt noted crying and upset stating that she was worried about revelations and going to hell. Pt easily re-directed via sitter.

## 2015-08-18 NOTE — ED Notes (Signed)
Pt and all belongings taken to [facility].

## 2015-08-18 NOTE — ED Notes (Addendum)
Patient walked back to [facility] and brought to room 43.  She was oriented to the unit and her room.   She was offered a snack and a drink.  When I brought her scheduled medications to her she declined the citalopram and the hydroxyzine stating she is not depressed or suicidal and is not anxious.  Denies thoughts of harm to self.  When I mentioned that it was reported to [place] that she came into the ED with suicidal thoughts with a plan to OD she denied this.  She states when she says she wants to die it is not to kill herself, but stating she wants to go to heaven.  She is currently in her room lying quietly on the bed watching television.

## 2015-08-18 NOTE — ED Notes (Signed)
Pt took off all jewelry and place on cover. Unable to find nose ring

## 2015-08-18 NOTE — Tx Team (Signed)
Initial Interdisciplinary Treatment Plan   PATIENT STRESSORS: curent living situation, decreased sleep, increased anxiety    PATIENT STRENGTHS: Ability for insight Capable of independent living Physical Health Religious Affiliation Supportive family/friends   PROBLEM LIST: Problem List/Patient Goals Date to be addressed Date deferred Reason deferred Estimated date of resolution  "I want to go to heaven"      "I don't want to go throught this anymore"                                                 DISCHARGE CRITERIA:  Improved stabilization in mood, thinking, and/or behavior  PRELIMINARY DISCHARGE PLAN: Return to previous living arrangement  PATIENT/FAMIILY INVOLVEMENT: This treatment plan has been presented to and reviewed with the patient, Colleen Peck.  The patient and family have been given the opportunity to ask questions and make suggestions.  [name] 08/18/2015, 6:47 PM

## 2015-08-18 NOTE — ED Notes (Signed)
EDP at bedside  

## 2015-08-18 NOTE — ED Notes (Signed)
TTS at bedside. 

## 2015-08-18 NOTE — BH Assessment (Signed)
[facility] Assessment Progress Note  Per Corena Pilgrim, MD, this pt requires psychiatric hospitalization at this time.  Letitia Libra, RN, [facility] has assigned pt to [facility] Rm 504-1.  Pt has signed Voluntary Admission and Consent for Treatment, as well as Consent to Release Information to Elmyra Ricks, her outpatient provider at [facility], and a notification call h as been placed.  Signed forms have been faxed to [facility].  Pt's nurse, Gerrit Friends, has been notified, and agrees to send original paperwork along with pt via [facility], and to call report to [phone].  Jalene Mullet, [place] Triage Specialist [phone]

## 2015-08-18 NOTE — ED Notes (Signed)
Resting quietly with eye closed. Easily arousable. Verbally responsive. Resp even and unlabored. ABC's intact. No behavior problems noted. Pt denies SI/HI and visual/audible hallucinations. NAD noted. Sitter at bedside. 

## 2015-08-18 NOTE — ED Notes (Signed)
Patient transferred to [facility].  Left the unit ambulatory with [facility] [employer].  All belongings given to the driver.  

## 2015-08-18 NOTE — ED Provider Notes (Signed)
CSN: [id number]     Arrival date & time 08/17/15  2312 History   First MD Initiated Contact with Patient 08/17/15 2323     Chief Complaint  Patient presents with  . Medical Clearance  . Flank Pain     (Consider location/radiation/quality/duration/timing/severity/associated sxs/prior Treatment) HPI Comments: Patient presents via [employer], called for suicidal ideation and report of plan to overdose on medications. Currently she states "I have been suicidal sometimes but I feel better now." She denies physical complaints at this time. She states it was her sister that called the police tonight. She denies alcohol or drug abuse or dependence.   The history is provided by the patient. No language interpreter was used.    Past Medical History  Diagnosis Date  . Asthma   . Seasonal allergies   . Depression   . PTSD (post-traumatic stress disorder)   . Thyroid disease 2009    Graves disease (pt reported resolved); hypothyriodism  . Abnormal pap     pt reports abnl pap many years ago.  Nl since then.  . Palpitations 03/12/2008   Past Surgical History  Procedure Laterality Date  . Dilation and curettage of uterus  March 2006   Family History  Problem Relation Age of Onset  . Drug abuse Father   . Depression Maternal Aunt   . Depression Maternal Grandmother   . Anxiety disorder Maternal Grandmother   . COPD Maternal Grandmother   . Suicidality Cousin   . Depression Cousin   . Depression Maternal Aunt   . Hypertension Mother   . Diabetes Paternal Grandfather   . COPD Paternal Grandmother   . Heart disease Neg Hx    Social History  Substance Use Topics  . Smoking status: Never Smoker   . Smokeless tobacco: Never Used  . Alcohol Use: 0.6 oz/week    1 Glasses of wine per week     Comment: past use of alcohol in '08-'09   OB History    No data available     Review of Systems  Constitutional: Negative for fever and chills.  HENT: Negative.   Respiratory: Negative.    Cardiovascular: Negative.   Gastrointestinal: Negative.   Genitourinary: Negative.   Musculoskeletal: Negative.   Skin: Negative.   Neurological: Negative.   Psychiatric/Behavioral: Positive for suicidal ideas and dysphoric mood.      Allergies  No known allergies  Home Medications   Prior to Admission medications   Medication Sig Start Date End Date Taking? Authorizing Provider  albuterol (PROVENTIL HFA) 108 (90 Base) MCG/ACT inhaler Inhale 2 puffs into the lungs every 6 (six) hours as needed. For shortness of breath, wheezing. 08/06/15   Janora Norlander, DO  albuterol (PROVENTIL) (2.5 MG/3ML) 0.083% nebulizer solution Take 3 mLs (2.5 mg total) by nebulization every 6 (six) hours as needed for wheezing or shortness of breath. 05/14/15   Ashly Windell Moulding, DO  beclomethasone (QVAR) 40 MCG/ACT inhaler Inhale 1 puff into the lungs 2 (two) times daily. 03/01/15   Kerrie Buffalo, NP  benzonatate (TESSALON) 100 MG capsule Take 2 capsules (200 mg total) by mouth 3 (three) times daily as needed for cough. 05/14/15   Lysbeth Penner, FNP  fluticasone (FLONASE) 50 MCG/ACT nasal spray Place 2 sprays into both nostrils daily. 04/15/15   Ashly Windell Moulding, DO  hydrOXYzine (ATARAX/VISTARIL) 25 MG tablet Take 1 tablet (25 mg total) by mouth every 6 (six) hours as needed for anxiety. 05/27/15   Alveda Reasons, MD  levothyroxine (SYNTHROID, LEVOTHROID) 112 MCG tablet Take 2 tablets (224 mcg total) by mouth at bedtime. 04/15/15   Ashly Windell Moulding, DO  predniSONE (DELTASONE) 20 MG tablet 3 Tabs PO Days 1-3, then 2 tabs PO Days 4-6, then 1 tab PO Day 7-9, then Half Tab PO Day 10-12 07/22/15   Janne Napoleon, NP  traZODone (DESYREL) 100 MG tablet Take 1 tablet (100 mg total) by mouth at bedtime. 05/27/15   Alveda Reasons, MD   BP 121/75 mmHg  Pulse 78  Temp(Src) 98.1 F (36.7 C) (Oral)  Resp 16  SpO2 100%  LMP 08/17/2015 (Exact Date) Physical Exam  Constitutional: She is oriented to person, place,  and time. She appears well-developed and well-nourished.  HENT:  Head: Normocephalic.  Neck: Normal range of motion. Neck supple.  Cardiovascular: Normal rate and regular rhythm.   Pulmonary/Chest: Effort normal and breath sounds normal.  Abdominal: Soft. Bowel sounds are normal. There is no tenderness. There is no rebound and no guarding.  Musculoskeletal: Normal range of motion.  Neurological: She is alert and oriented to person, place, and time.  Skin: Skin is warm and dry. No rash noted.  Psychiatric: She has a normal mood and affect. Her speech is normal. She expresses suicidal ideation.    ED Course  Procedures (including critical care time) Labs Review Labs Reviewed  URINALYSIS, ROUTINE W REFLEX MICROSCOPIC (NOT AT [facility]) - Abnormal; Notable for the following:    APPearance TURBID (*)    Bilirubin Urine SMALL (*)    Ketones, ur 40 (*)    Leukocytes, UA SMALL (*)    All other components within normal limits  CBC - Abnormal; Notable for the following:    Hemoglobin 10.7 (*)    HCT 34.0 (*)    MCH 25.7 (*)    All other components within normal limits  URINE MICROSCOPIC-ADD ON - Abnormal; Notable for the following:    Squamous Epithelial / LPF 6-30 (*)    Bacteria, UA FEW (*)    All other components within normal limits  URINE RAPID DRUG SCREEN, HOSP PERFORMED  COMPREHENSIVE METABOLIC PANEL  ETHANOL  SALICYLATE LEVEL  ACETAMINOPHEN LEVEL  POC URINE PREG, ED   Results for orders placed or performed during the hospital encounter of 08/17/15  Urinalysis, Routine w reflex microscopic-may I&O cath if menses (not at [facility])  Result Value Ref Range   Color, Urine YELLOW YELLOW   APPearance TURBID (A) CLEAR   Specific Gravity, Urine 1.026 1.005 - 1.030   pH 5.0 5.0 - 8.0   Glucose, UA NEGATIVE NEGATIVE mg/dL   Hgb urine dipstick NEGATIVE NEGATIVE   Bilirubin Urine SMALL (A) NEGATIVE   Ketones, ur 40 (A) NEGATIVE mg/dL   Protein, ur NEGATIVE NEGATIVE mg/dL   Nitrite NEGATIVE  NEGATIVE   Leukocytes, UA SMALL (A) NEGATIVE  Comprehensive metabolic panel  Result Value Ref Range   Sodium 141 135 - 145 mmol/L   Potassium 3.5 3.5 - 5.1 mmol/L   Chloride 106 101 - 111 mmol/L   CO2 24 22 - 32 mmol/L   Glucose, Bld 102 (H) 65 - 99 mg/dL   BUN 7 6 - 20 mg/dL   Creatinine, Ser 0.91 0.44 - 1.00 mg/dL   Calcium 9.7 8.9 - 10.3 mg/dL   Total Protein 7.9 6.5 - 8.1 g/dL   Albumin 4.3 3.5 - 5.0 g/dL   AST 18 15 - 41 U/L   ALT 23 14 - 54 U/L   Alkaline Phosphatase 56  38 - 126 U/L   Total Bilirubin 0.8 0.3 - 1.2 mg/dL   GFR calc non Af Amer >60 >60 mL/min   GFR calc Af Amer >60 >60 mL/min   Anion gap 11 5 - 15  Ethanol (ETOH)  Result Value Ref Range   Alcohol, Ethyl (B) <5 <5 mg/dL  Salicylate level  Result Value Ref Range   Salicylate Lvl 123456 2.8 - 30.0 mg/dL  Acetaminophen level  Result Value Ref Range   Acetaminophen (Tylenol), Serum <10 (L) 10 - 30 ug/mL  CBC  Result Value Ref Range   WBC 8.0 4.0 - 10.5 K/uL   RBC 4.16 3.87 - 5.11 MIL/uL   Hemoglobin 10.7 (L) 12.0 - 15.0 g/dL   HCT 34.0 (L) 36.0 - 46.0 %   MCV 81.7 78.0 - 100.0 fL   MCH 25.7 (L) 26.0 - 34.0 pg   MCHC 31.5 30.0 - 36.0 g/dL   RDW 15.2 11.5 - 15.5 %   Platelets 293 150 - 400 K/uL  Urine rapid drug screen (hosp performed) (Not at [facility])  Result Value Ref Range   Opiates NONE DETECTED NONE DETECTED   Cocaine NONE DETECTED NONE DETECTED   Benzodiazepines NONE DETECTED NONE DETECTED   Amphetamines NONE DETECTED NONE DETECTED   Tetrahydrocannabinol NONE DETECTED NONE DETECTED   Barbiturates NONE DETECTED NONE DETECTED  Urine microscopic-add on  Result Value Ref Range   Squamous Epithelial / LPF 6-30 (A) NONE SEEN   WBC, UA 0-5 0 - 5 WBC/hpf   RBC / HPF NONE SEEN 0 - 5 RBC/hpf   Bacteria, UA FEW (A) NONE SEEN   Urine-Other MUCOUS PRESENT   POC urine preg, ED (not at [facility])  Result Value Ref Range   Preg Test, Ur NEGATIVE NEGATIVE    Imaging Review No results found. I have personally  reviewed and evaluated these images and lab results as part of my medical decision-making.   EKG Interpretation None      MDM   Final diagnoses:  None    1. Suicidal ideation  The patient has a history of previous psychiatric evaluations/admissions, diagnosis mood disorder. Will have TTS evaluate to determine need for inpatient treatment.   The patient related flank pain on admission to ED, but denies physical symptoms currently. Will continue to observe.  1:45: Vicente Males with TTS advises patient meets inpatient criteria. No bed available. Will seek placement.  Charlann Lange, PA-C [phone]  Delora Fuel, MD [id number] [phone]

## 2015-08-18 NOTE — ED Notes (Addendum)
Psych MD and NP at bedside. 

## 2015-08-18 NOTE — [facility] Counselor (Signed)
Psych disposition: Per Arlester Marker, NP, pt meets inpt criteria. No appropriate beds at [facility] currently. TTS to seek placement.  Charlann Lange, PA-C notified of disposition.

## 2015-08-18 NOTE — ED Notes (Signed)
Pt and personal belongings wanded via security.

## 2015-08-18 NOTE — Consult Note (Signed)
[facility] Face-to-Face Psychiatry Consult   Reason for Consult:  Depression  Referring Physician:  EDP  Patient Identification: Colleen Peck MRN:  [id number] Principal Diagnosis: Major depressive disorder, recurrent, severe w/o psychotic behavior ([place]) Diagnosis:   Patient Active Problem List   Diagnosis Date Noted  . Major depressive disorder, recurrent, severe w/o psychotic behavior ([place]) [F33.2] 08/18/2015    Priority: High  . Thyroid activity decreased [E03.9]   . Hypothyroidism [E03.9] 02/26/2015  . Bipolar and related disorder due to another medical condition with manic or hypomanic-like episodes [F06.33] 02/26/2015  . History of posttraumatic stress disorder (PTSD) [Z86.59] 02/26/2015  . Anemia [D64.9] 01/30/2015  . Keloid of skin [L91.0] 09/04/2014  . Preventative health care [Z00.00] 09/04/2014  . Hearing difficulty of both ears [H91.93] 09/04/2014  . Bloody discharge from right nipple [N64.52] 12/17/2013  . Sleeping difficulty [G47.9] 11/26/2013  . Unspecified vitamin D deficiency [E55.9] 05/22/2013  . Galactorrhea [O92.6] 11/30/2011  . Preventive measure [Z29.9] 11/30/2011  . Hypothyroidism (acquired) [E03.9] 11/29/2011  . ANGIOEDEMA [T78.3XXA] 05/02/2008  . Obesity [E66.9] 09/08/2006  . RHINITIS, ALLERGIC [J30.9] 09/08/2006  . Asthma [J45.909] 09/08/2006  . ECZEMA, ATOPIC DERMATITIS [L20.89] 09/08/2006    Total Time spent with patient: 45 minutes  Subjective:   Colleen Peck is a 35 y.o. female patient admitted with increased depression with plan to overdose.  HPI: Patient is a 35 year-old African-American female admitted to the emergency department with increased depression. Patient currently endorses suicidal ideation with a plan to overdose on her medications. Patient is tearful and upset on assessment, stating "I'm just tired. I can't do this anymore." Patient refuses to talk to provider and only cries "I need help." Patient  Patient denies homicidal ideation and  auditory or visual hallucinations. Patient denies alcohol or drug use.  Past Psychiatric History: Patient reports being hospitalized "last year in August" for depression and also a hospitalization two years ago for being "depressed and burnt out"  Risk to Self: Suicidal Ideation: No-Not Currently/Within Last 6 Months Suicidal Intent: No Is patient at risk for suicide?: Yes Suicidal Plan?: No-Not Currently/Within Last 6 Months Access to Means: Yes Specify Access to Suicidal Means: Access to medications What has been your use of drugs/alcohol within the last 12 months?: Pt denies How many times?: 0 Other Self Harm Risks: None known Triggers for Past Attempts: None known Intentional Self Injurious Behavior: None Risk to Others: Homicidal Ideation: No Thoughts of Harm to Others: No Current Homicidal Intent: No Current Homicidal Plan: No Access to Homicidal Means: No Identified Victim: n/a History of harm to others?: No Assessment of Violence: None Noted Violent Behavior Description: none Does patient have access to weapons?: No Criminal Charges Pending?: No Does patient have a court date: No Prior Inpatient Therapy: Prior Inpatient Therapy: Yes Prior Therapy Dates: Multiple (Inpatient and Intensive psych) Prior Therapy Facilty[facility]Provider[facility] Reason for Treatment: PTSD, Depression Prior Outpatient Therapy: Prior Outpatient Therapy: Yes Prior Therapy Dates: Ongoing Prior Therapy Facilty/Provider(s): [facility] Reason for Treatment: Therapy, Med management Does patient have an ACCT team?: No Does patient have Intensive In-House Services?  : No Does patient have [facility] services? : No Does patient have P4CC services?: No  Past Medical History:  Past Medical History  Diagnosis Date  . Asthma   . Seasonal allergies   . Depression   . PTSD (post-traumatic stress disorder)   . Thyroid disease 2009    Graves disease (pt reported resolved); hypothyriodism  .  Abnormal pap  pt reports abnl pap many years ago.  Nl since then.  . Palpitations 03/12/2008    Past Surgical History  Procedure Laterality Date  . Dilation and curettage of uterus  March 2006   Family History:  Family History  Problem Relation Age of Onset  . Drug abuse Father   . Depression Maternal Aunt   . Depression Maternal Grandmother   . Anxiety disorder Maternal Grandmother   . COPD Maternal Grandmother   . Suicidality Cousin   . Depression Cousin   . Depression Maternal Aunt   . Hypertension Mother   . Diabetes Paternal Grandfather   . COPD Paternal Grandmother   . Heart disease Neg Hx    Family Psychiatric  History: None reported Social History:  History  Alcohol Use  . 0.6 oz/week  . 1 Glasses of wine per week    Comment: past use of alcohol in '08-'09     History  Drug Use  . Yes  . Special: Marijuana    Comment: past use of marijuana in '08-'09. occasional eats brownies w/ marijuana    Social History   Social History  . Marital Status: Single    Spouse Name: N/A  . Number of Children: N/A  . Years of Education: N/A   Social History Main Topics  . Smoking status: Never Smoker   . Smokeless tobacco: Never Used  . Alcohol Use: 0.6 oz/week    1 Glasses of wine per week     Comment: past use of alcohol in '08-'09  . Drug Use: Yes    Special: Marijuana     Comment: past use of marijuana in '08-'09. occasional eats brownies w/ marijuana  . Sexual Activity: Not Currently    Birth Control/ Protection: Abstinence   Other Topics Concern  . 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   Additional Social History:    Allergies:   Allergies  Allergen Reactions  . No Known Allergies Other (See Comments)    Pt & family state they do not know if pt has any allergies     Labs:  Results for orders placed or performed during the hospital encounter of 08/17/15 (from the  past 48 hour(s))  Urinalysis, Routine w reflex microscopic-may I&O cath if menses (not at [facility])     Status: Abnormal   Collection Time: 08/17/15 11:37 PM  Result Value Ref Range   Color, Urine YELLOW YELLOW   APPearance TURBID (A) CLEAR   Specific Gravity, Urine 1.026 1.005 - 1.030   pH 5.0 5.0 - 8.0   Glucose, UA NEGATIVE NEGATIVE mg/dL   Hgb urine dipstick NEGATIVE NEGATIVE   Bilirubin Urine SMALL (A) NEGATIVE   Ketones, ur 40 (A) NEGATIVE mg/dL   Protein, ur NEGATIVE NEGATIVE mg/dL   Nitrite NEGATIVE NEGATIVE   Leukocytes, UA SMALL (A) NEGATIVE  Urine rapid drug screen (hosp performed) (Not at [facility])     Status: None   Collection Time: 08/17/15 11:37 PM  Result Value Ref Range   Opiates NONE DETECTED NONE DETECTED   Cocaine NONE DETECTED NONE DETECTED   Benzodiazepines NONE DETECTED NONE DETECTED   Amphetamines NONE DETECTED NONE DETECTED   Tetrahydrocannabinol NONE DETECTED NONE DETECTED   Barbiturates NONE DETECTED NONE DETECTED    Comment:        DRUG SCREEN FOR MEDICAL PURPOSES ONLY.  IF CONFIRMATION IS NEEDED FOR ANY PURPOSE, NOTIFY LAB WITHIN  5 DAYS.        LOWEST DETECTABLE LIMITS FOR URINE DRUG SCREEN Drug Class       Cutoff (ng/mL) Amphetamine      1000 Barbiturate      200 Benzodiazepine   694 Tricyclics       503 Opiates          300 Cocaine          300 THC              50   Urine microscopic-add on     Status: Abnormal   Collection Time: 08/17/15 11:37 PM  Result Value Ref Range   Squamous Epithelial / LPF 6-30 (A) NONE SEEN   WBC, UA 0-5 0 - 5 WBC/hpf   RBC / HPF NONE SEEN 0 - 5 RBC/hpf   Bacteria, UA FEW (A) NONE SEEN   Urine-Other MUCOUS PRESENT     Comment: AMORPHOUS URATES/PHOSPHATES  POC urine preg, ED (not at [facility])     Status: None   Collection Time: 08/17/15 11:45 PM  Result Value Ref Range   Preg Test, Ur NEGATIVE NEGATIVE    Comment:        THE SENSITIVITY OF THIS METHODOLOGY IS >24 mIU/mL   Comprehensive metabolic panel     Status:  Abnormal   Collection Time: 08/18/15 12:05 AM  Result Value Ref Range   Sodium 141 135 - 145 mmol/L   Potassium 3.5 3.5 - 5.1 mmol/L   Chloride 106 101 - 111 mmol/L   CO2 24 22 - 32 mmol/L   Glucose, Bld 102 (H) 65 - 99 mg/dL   BUN 7 6 - 20 mg/dL   Creatinine, Ser 0.91 0.44 - 1.00 mg/dL   Calcium 9.7 8.9 - 10.3 mg/dL   Total Protein 7.9 6.5 - 8.1 g/dL   Albumin 4.3 3.5 - 5.0 g/dL   AST 18 15 - 41 U/L   ALT 23 14 - 54 U/L   Alkaline Phosphatase 56 38 - 126 U/L   Total Bilirubin 0.8 0.3 - 1.2 mg/dL   GFR calc non Af Amer >60 >60 mL/min   GFR calc Af Amer >60 >60 mL/min    Comment: (NOTE) The eGFR has been calculated using the CKD EPI equation. This calculation has not been validated in all clinical situations. eGFR's persistently <60 mL/min signify possible Chronic Kidney Disease.    Anion gap 11 5 - 15  Ethanol (ETOH)     Status: None   Collection Time: 08/18/15 12:05 AM  Result Value Ref Range   Alcohol, Ethyl (B) <5 <5 mg/dL    Comment:        LOWEST DETECTABLE LIMIT FOR SERUM ALCOHOL IS 5 mg/dL FOR MEDICAL PURPOSES ONLY   Salicylate level     Status: None   Collection Time: 08/18/15 12:05 AM  Result Value Ref Range   Salicylate Lvl <8.8 2.8 - 30.0 mg/dL  Acetaminophen level     Status: Abnormal   Collection Time: 08/18/15 12:05 AM  Result Value Ref Range   Acetaminophen (Tylenol), Serum <10 (L) 10 - 30 ug/mL    Comment:        THERAPEUTIC CONCENTRATIONS VARY SIGNIFICANTLY. A RANGE OF 10-30 ug/mL MAY BE AN EFFECTIVE CONCENTRATION FOR MANY PATIENTS. HOWEVER, SOME ARE BEST TREATED AT CONCENTRATIONS OUTSIDE THIS RANGE. ACETAMINOPHEN CONCENTRATIONS >150 ug/mL AT 4 HOURS AFTER INGESTION AND >50 ug/mL AT 12 HOURS AFTER INGESTION ARE OFTEN ASSOCIATED WITH TOXIC REACTIONS.   CBC  Status: Abnormal   Collection Time: 08/18/15 12:05 AM  Result Value Ref Range   WBC 8.0 4.0 - 10.5 K/uL   RBC 4.16 3.87 - 5.11 MIL/uL   Hemoglobin 10.7 (L) 12.0 - 15.0 g/dL   HCT  34.0 (L) 36.0 - 46.0 %   MCV 81.7 78.0 - 100.0 fL   MCH 25.7 (L) 26.0 - 34.0 pg   MCHC 31.5 30.0 - 36.0 g/dL   RDW 15.2 11.5 - 15.5 %   Platelets 293 150 - 400 K/uL    Current Facility-Administered Medications  Medication Dose Route Frequency Provider Last Rate Last Dose  . acetaminophen (TYLENOL) tablet 650 mg  650 mg Oral Q4H PRN Shari Upstill, PA-C      . albuterol (PROVENTIL HFA;VENTOLIN HFA) 108 (90 Base) MCG/ACT inhaler 2 puff  2 puff Inhalation Q6H PRN Patrecia Pour, NP      . cephALEXin (KEFLEX) capsule 500 mg  500 mg Oral TID AC Patrecia Pour, NP      . citalopram (CELEXA) tablet 20 mg  20 mg Oral Daily Patrecia Pour, NP      . Derrill Memo ON 08/19/2015] ferrous sulfate tablet 325 mg  325 mg Oral Q breakfast Patrecia Pour, NP      . fluticasone (FLOVENT HFA) 44 MCG/ACT inhaler 2 puff  2 puff Inhalation BID Patrecia Pour, NP      . hydrOXYzine (ATARAX/VISTARIL) tablet 25 mg  25 mg Oral TID Patrecia Pour, NP      . levothyroxine (SYNTHROID, LEVOTHROID) tablet 224 mcg  224 mcg Oral QHS Patrecia Pour, NP      . multivitamin with minerals tablet 1 tablet  1 tablet Oral Daily Patrecia Pour, NP      . traZODone (DESYREL) tablet 100 mg  100 mg Oral QHS Patrecia Pour, NP       Current Outpatient Prescriptions  Medication Sig Dispense Refill  . albuterol (PROVENTIL HFA) 108 (90 Base) MCG/ACT inhaler Inhale 2 puffs into the lungs every 6 (six) hours as needed. For shortness of breath, wheezing. 1 Inhaler 1  . albuterol (PROVENTIL) (2.5 MG/3ML) 0.083% nebulizer solution Take 3 mLs (2.5 mg total) by nebulization every 6 (six) hours as needed for wheezing or shortness of breath. 150 mL 1  . beclomethasone (QVAR) 40 MCG/ACT inhaler Inhale 1 puff into the lungs 2 (two) times daily. 1 Inhaler 12  . ferrous sulfate 325 (65 FE) MG tablet Take 325 mg by mouth daily with breakfast.    . fluticasone (FLONASE) 50 MCG/ACT nasal spray Place 2 sprays into both nostrils daily. 16 g 6  . hydrOXYzine  (ATARAX/VISTARIL) 25 MG tablet Take 1 tablet (25 mg total) by mouth every 6 (six) hours as needed for anxiety. 30 tablet 2  . levothyroxine (SYNTHROID, LEVOTHROID) 112 MCG tablet Take 2 tablets (224 mcg total) by mouth at bedtime. 60 tablet 5  . Multiple Vitamin (MULTIVITAMIN WITH MINERALS) TABS tablet Take 1 tablet by mouth daily.    . traZODone (DESYREL) 100 MG tablet Take 1 tablet (100 mg total) by mouth at bedtime. 30 tablet 2  . benzonatate (TESSALON) 100 MG capsule Take 2 capsules (200 mg total) by mouth 3 (three) times daily as needed for cough. (Patient not taking: Reported on 08/18/2015) 21 capsule 0  . predniSONE (DELTASONE) 20 MG tablet 3 Tabs PO Days 1-3, then 2 tabs PO Days 4-6, then 1 tab PO Day 7-9, then Half Tab PO Day 10-12 (Patient  not taking: Reported on 08/18/2015) 20 tablet 0  . [DISCONTINUED] benztropine (COGENTIN) 1 MG tablet Take 0.5 mg by mouth daily.      Musculoskeletal: Strength & Muscle Tone: within normal limits Gait & Station: normal Patient leans: N/A  Psychiatric Specialty Exam: Review of Systems  Constitutional: Negative.   HENT: Negative.   Eyes: Negative.   Respiratory: Negative.   Cardiovascular: Negative.   Gastrointestinal: Negative.   Genitourinary: Negative.   Musculoskeletal: Negative.   Skin: Negative.   Neurological: Negative.   Endo/Heme/Allergies: Negative.   Psychiatric/Behavioral: Positive for depression and suicidal ideas. The patient has insomnia.     Blood pressure 124/71, pulse 76, temperature 98.1 F (36.7 C), temperature source Oral, resp. rate 17, last menstrual period 08/17/2015, SpO2 100 %.There is no weight on file to calculate BMI.  General Appearance: Disheveled  Eye Contact::  Poor  Speech:  Slow  Volume:  Decreased  Mood:  Depressed  Affect:  Congruent and Tearful  Thought Process:  Goal Directed, Linear and Logical  Orientation:  Full (Time, Place, and Person)  Thought Content:  WDL  Suicidal Thoughts:  Yes.  with  intent/plan  Homicidal Thoughts:  No  Memory:  Immediate;   Fair Recent;   Fair Remote;   Fair  Judgement:  Poor  Insight:  Lacking  Psychomotor Activity:  Normal  Concentration:  Poor  Recall:  [employer] of Knowledge:Fair  Language: Fair  Akathisia:  NA  Handed:  Right  AIMS (if indicated):     Assets:  Communication Skills Desire for Improvement Physical Health  ADL's:  Intact  Cognition: WNL  Sleep:      Treatment Plan Summary: Daily contact with patient to assess and evaluate symptoms and progress in treatment, Medication management and Diagnosis:  Major Depressive Disorder, recurrent, severe, without psychotic features -Crisis Stabilization -Individual Counseling -Medication Management:  Start:  Citalopram 74m Daily for mood stabilization  Trazodone 1093mQHS for insomnia  Vistaril 25 TID for anxiety  Keflex 50050mID for 7 days for UTI    Disposition: Recommend psychiatric Inpatient admission when medically cleared.  LORWaylan BogaP 08/18/2015 11:58 AM Patient seen face-to-face for psychiatric evaluation, chart reviewed and case discussed with the physician extender and developed treatment plan. Reviewed the information documented and agree with the treatment plan. MojCorena PilgrimD

## 2015-08-19 ENCOUNTER — Encounter (HOSPITAL_COMMUNITY): Payer: Self-pay | Admitting: Psychiatry

## 2015-08-19 DIAGNOSIS — F4310 Post-traumatic stress disorder, unspecified: Principal | ICD-10-CM

## 2015-08-19 DIAGNOSIS — F411 Generalized anxiety disorder: Secondary | ICD-10-CM

## 2015-08-19 MED ORDER — TRAZODONE HCL 100 MG PO TABS
100.0000 mg | ORAL_TABLET | Freq: Every evening | ORAL | Status: DC | PRN
  Administered 2015-08-19: 100 mg via ORAL
  Filled 2015-08-19: qty 1
  Filled 2015-08-19: qty 7

## 2015-08-19 MED ORDER — QUETIAPINE FUMARATE 25 MG PO TABS
25.0000 mg | ORAL_TABLET | Freq: Every day | ORAL | Status: DC
  Administered 2015-08-19 – 2015-08-24 (×6): 25 mg via ORAL
  Filled 2015-08-19 (×2): qty 1
  Filled 2015-08-19: qty 21
  Filled 2015-08-19 (×4): qty 1
  Filled 2015-08-19: qty 21
  Filled 2015-08-19: qty 1

## 2015-08-19 MED ORDER — CITALOPRAM HYDROBROMIDE 10 MG PO TABS
10.0000 mg | ORAL_TABLET | Freq: Every day | ORAL | Status: DC
  Administered 2015-08-19 – 2015-08-25 (×7): 10 mg via ORAL
  Filled 2015-08-19: qty 1
  Filled 2015-08-19: qty 7
  Filled 2015-08-19 (×8): qty 1

## 2015-08-19 MED ORDER — QUETIAPINE FUMARATE 50 MG PO TABS
50.0000 mg | ORAL_TABLET | Freq: Three times a day (TID) | ORAL | Status: DC | PRN
  Administered 2015-08-24: 50 mg via ORAL
  Filled 2015-08-19: qty 10
  Filled 2015-08-19: qty 1

## 2015-08-19 NOTE — BHH Suicide Risk Assessment (Signed)
[facility] Admission Suicide Risk Assessment   Nursing information obtained from:  Patient Demographic factors:  Living alone, Unemployed Current Mental Status:  Suicidal ideation indicated by others Loss Factors:  NA Historical Factors:  Prior suicide attempts Risk Reduction Factors:  Religious beliefs about death  Total Time spent with patient: 30 minutes Principal Problem: PTSD (post-traumatic stress disorder) Diagnosis:   Patient Active Problem List   Diagnosis Date Noted  . PTSD (post-traumatic stress disorder) [F43.10] 08/19/2015  . Generalized anxiety disorder [F41.1] 08/19/2015  . Thyroid activity decreased [E03.9]   . Bipolar and related disorder due to another medical condition with manic or hypomanic-like episodes [F06.33] 02/26/2015  . Anemia [D64.9] 01/30/2015  . Keloid of skin [L91.0] 09/04/2014  . Hearing difficulty of both ears [H91.93] 09/04/2014  . Bloody discharge from right nipple [N64.52] 12/17/2013  . Unspecified vitamin D deficiency [E55.9] 05/22/2013  . Galactorrhea [O92.6] 11/30/2011  . Hypothyroidism (acquired) [E03.9] 11/29/2011  . Obesity [E66.9] 09/08/2006  . RHINITIS, ALLERGIC [J30.9] 09/08/2006  . Asthma [J45.909] 09/08/2006  . ECZEMA, ATOPIC DERMATITIS [L20.89] 09/08/2006   Subjective Data: Please see H&P.   Continued Clinical Symptoms:  Alcohol Use Disorder Identification Test Final Score (AUDIT): 0 The "Alcohol Use Disorders Identification Test", Guidelines for Use in Primary Care, Second Edition.  World Pharmacologist [facility] Score between 0-7:  no or low risk or alcohol related problems. Score between 8-15:  moderate risk of alcohol related problems. Score between 16-19:  high risk of alcohol related problems. Score 20 or above:  warrants further diagnostic evaluation for alcohol dependence and treatment.   CLINICAL FACTORS:   Severe Anxiety and/or Agitation Unstable or Poor Therapeutic Relationship Previous Psychiatric Diagnoses and  Treatments   Psychiatric Specialty Exam: Review of Systems  Psychiatric/Behavioral: The patient is nervous/anxious and has insomnia.   All other systems reviewed and are negative.   Blood pressure 103/61, pulse 101, temperature 98.3 F (36.8 C), temperature source Oral, resp. rate 18, height 5\' 7"  (1.702 m), weight 112.038 kg (247 lb), last menstrual period 08/17/2015, SpO2 100 %.Body mass index is 38.68 kg/(m^2).                        Please see H&P.                                 COGNITIVE FEATURES THAT CONTRIBUTE TO RISK:  Closed-mindedness, Polarized thinking and Thought constriction (tunnel vision)    SUICIDE RISK:   Mild:  Suicidal ideation of limited frequency, intensity, duration, and specificity.  There are no identifiable plans, no associated intent, mild dysphoria and related symptoms, good self-control (both objective and subjective assessment), few other risk factors, and identifiable protective factors, including available and accessible social support.  PLAN OF CARE: Please see H&P.   I certify that inpatient services furnished can reasonably be expected to improve the patient's condition.   Sequan Auxier, MD 08/19/2015, 12:39 PM

## 2015-08-19 NOTE — Progress Notes (Addendum)
D: Pt presents anxious, apprehensive, and somatic. Pt repeatedly states that she is not feeling well.  "My heart is racing". Pt's vitals obtained and were WDL. Pt requested for staff to use a different machine. Pt was informed that her previous vitals were also WDL, per verbal report and chart review. Pt later complained of SOB. Pt's respirations appeared unlabored. Pt was encouraged to use deep breathing exercises to help relieve any anxiety as she endorsed having racing thoughts. Pt declined to do such.Pt was reluctant to taking the Seroquel that she had scheduled for her racing thoughts and sleep.  "I'm not anxious". Writer  informed pt of her order for an EKG. Writer later informed pt that her EKG was completed prior to shift change and that is was WDL. Pt appeared less anxious at this time. Writer offer to obtain another set of vitals with O2 saturation due to her recent complaint of SOB. "My oxygen is fine". "I feel better now". Pt then agreed to take her scheduled Seroquel as prescribed.   A: Writer administered scheduled and prn medications to pt, per MD orders. Continued support and availability as needed was extended to this pt. Staff continues to monitor pt with q15min checks.  R: No adverse drug reactions noted. Pt receptive to treatment. Pt remains safe at this time.    Pt denies any SI/HI/AVH.  Pt denied any pain or abnormal urinary symptoms

## 2015-08-19 NOTE — BHH Group Notes (Signed)
[facility] LCSW Group Therapy  08/19/2015 , 12:50 PM   Type of Therapy:  Group Therapy  Participation Level:  Active  Participation Quality:  Attentive  Affect:  Appropriate  Cognitive:  Alert  Insight:  Improving  Engagement in Therapy:  Engaged  Modes of Intervention:  Discussion, Exploration and Socialization  Summary of Progress/Problems: Today's group focused on the term Diagnosis.  Participants were asked to define the term, and then pronounce whether it is a negative, positive or neutral term. Was in group room for 5 minutes before leaving and not returning. Colleen Peck B 08/19/2015 , 12:50 PM

## 2015-08-19 NOTE — Progress Notes (Signed)
Adult Psychoeducational Group Note  Date:  08/19/2015 Time:  9:06 PM  Group Topic/Focus:  Wrap-Up Group:   The focus of this group is to help patients review their daily goal of treatment and discuss progress on daily workbooks.  Participation Level:  Active  Participation Quality:  Appropriate  Affect:  Appropriate  Cognitive:  Appropriate  Insight: Appropriate  Engagement in Group:  Engaged  Modes of Intervention:  Discussion  Additional Comments: The patient expressed that she rates her day a 8.  Nash Shearer 08/19/2015, 9:06 PM

## 2015-08-19 NOTE — Progress Notes (Signed)
D: Pt denies SI/HI/AVH. Pt is pleasant and cooperative. Pt forwards little information.   A: Pt was offered support and encouragement. Pt was given scheduled medications. Pt was encourage to attend groups. Q 15 minute checks were done for safety.   R:Pt attends groups and interacts well with peers and staff. Pt is taking medication. Pt has no complaints at this time .Pt receptive to treatment and safety maintained on unit.
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
## 2015-08-19 NOTE — Progress Notes (Signed)
D- patient has been attending groups engaged in unit activities.  Patient denies SI, HI and AVH but has a somatic complaint of urinary frequency.  Patient keeps stating I don't feel well and requesting for her vital signs to be checked.  Vital signs have been within normal limits.    A- Assess patient for safety, offer medications a prescribed, engage patient in 1:1 therapeutic talks.   R-  Patient is able to contract for safety.

## 2015-08-20 MED ORDER — ENSURE ENLIVE PO LIQD
237.0000 mL | Freq: Three times a day (TID) | ORAL | Status: DC
  Administered 2015-08-20 – 2015-08-23 (×10): 237 mL via ORAL

## 2015-08-20 NOTE — Progress Notes (Signed)
Adult Psychoeducational Group Note  Date:  08/20/2015 Time:  9:26 PM  Group Topic/Focus:  Wrap-Up Group:   The focus of this group is to help patients review their daily goal of treatment and discuss progress on daily workbooks.  Participation Level:  Active  Participation Quality:  Appropriate  Affect:  Appropriate  Cognitive:  Alert  Insight: Appropriate  Engagement in Group:  Engaged  Modes of Intervention:  Discussion  Additional Comments:  Patient goal for today was to maintain self care. Patient stated her day started off good, but ended bad.   Colleen Peck L Colleen Peck 08/20/2015, 9:26 PM
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
## 2015-08-20 NOTE — Progress Notes (Signed)
Colleen Peck has started c/o of upper right side abdominal pain.  She was sitting quietly in the day room coloring a picture and watching TV.    Vital signs were stable at 118/85 and pulse 94.  She states that she just started her period.  Tylenol given for back pain earlier with good relief. She is currently laying quietly in her room.  We will continue to monitor and urged her to talk with the doctor about her symptoms.  We will continue to monitor her symptoms.

## 2015-08-20 NOTE — Progress Notes (Signed)
Lisa-Jane has been up and visible on the unit.  She has been seen sitting at the end of the hall with peers talking.  She denies SI/HI or A/V hallucinations.  She appears to be in no physical distress.  Answered questions about her medication and urged her to talk with the doctor about changes.  She attended groups.  She stated that she wanted to eat healthier and wanted to eat 6 small meals per day.  Reminded her when snack times are but urged her to talk with the doctor about her dietary plans.  New orders noted for ensure between meals.  She completed her self inventory and reports that her depression, hopelessness and anxiety are 0/10.  Her goal for today is "rest, wellness and self care" and she will accomplish this goal by "rest/selfcare."  Encouraged continued participation in group and unit activities.  Q 15 minute checks maintained for safety.  We will continue to monitor the progress towards her goals.
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
## 2015-08-20 NOTE — BHH Group Notes (Signed)
[facility] LCSW Aftercare Discharge Planning Group Note   08/20/2015 1:41 PM  Participation Quality:  Active   Mood/Affect:  Appropriate  Depression Rating:    Anxiety Rating:    Thoughts of Suicide:  No Will you contract for safety?   NA  Current AVH:  No  Plan for Discharge/Comments:  Pt currently denies all symptoms. Pt expressed to CSW that she does not want to share in front of others and would rather meet one on one. CSW agreed to meet with pt after group. Pt will follow-up outpt at [facility].  Transportation Means:   Supports:  [name]

## 2015-08-20 NOTE — BHH Group Notes (Signed)
[facility] LCSW Group Therapy  08/20/2015 1:55 PM  Type of Therapy: Group Therapy  Participation Level: Active  Participation Quality: Attentive  Affect: Flat  Cognitive: Oriented  Insight: Limited  Engagement in Therapy: Engaged  Modes of Intervention: Discussion and Socialization  Summary of Progress/Problems: Shanon Brow from the [place] was here to tell his story of recovery and play his guitar. Pt was in and out of the room for the first half of group. For the second half of group pt sat down and stayed. Pt was pleasant and alert.  Kara Mead. Marshell Levan 08/20/2015 1:55 PM

## 2015-08-21 LAB — URINALYSIS W MICROSCOPIC (NOT AT ARMC)
BILIRUBIN URINE: NEGATIVE
Glucose, UA: NEGATIVE mg/dL
KETONES UR: NEGATIVE mg/dL
LEUKOCYTES UA: NEGATIVE
NITRITE: NEGATIVE
Protein, ur: NEGATIVE mg/dL
SPECIFIC GRAVITY, URINE: 1.019 (ref 1.005–1.030)
WBC UA: NONE SEEN WBC/hpf (ref 0–5)
pH: 7 (ref 5.0–8.0)

## 2015-08-21 LAB — TSH: TSH: 1.976 u[IU]/mL (ref 0.350–4.500)

## 2015-08-21 LAB — LIPID PANEL
CHOLESTEROL: 180 mg/dL (ref 0–200)
HDL: 38 mg/dL — AB (ref >40)
LDL CALC: 122 mg/dL — AB (ref 0–99)
TRIGLYCERIDES: 102 mg/dL (ref <150)
Total CHOL/HDL Ratio: 4.7 RATIO
VLDL: 20 mg/dL (ref 0–40)

## 2015-08-21 LAB — IRON AND TIBC
Iron: 30 ug/dL (ref 28–170)
SATURATION RATIOS: 8 % — AB (ref 10.4–31.8)
TIBC: 395 ug/dL (ref 250–450)
UIBC: 365 ug/dL

## 2015-08-21 LAB — FERRITIN: Ferritin: 20 ng/mL (ref 11–307)

## 2015-08-21 NOTE — BHH Group Notes (Signed)
[place] [facility] Group Notes:  (Counselor/Nursing/MHT/Case Management/Adjunct)  08/21/2015 1:15PM  Type of Therapy:  Group Therapy  Participation Level:  Active  Participation Quality:  Appropriate  Affect:  Flat  Cognitive:  Oriented  Insight:  Improving  Engagement in Group:  Limited  Engagement in Therapy:  Limited  Modes of Intervention:  Discussion, Exploration and Socialization  Summary of Progress/Problems: The topic for group was balance in life.  Pt participated in the discussion about when their life was in balance and out of balance and how this feels.  Pt discussed ways to get back in balance and short term goals they can work on to get where they want to be. "When I feel overwhelmed and anxious, I know I am unbalanced.  I have friends and family that can help me with that.  In this moment, I'm feeling like I am learning how to get there by participating and coming to groups."  Minimal participation but engaged throughout.   Roque Lias B 08/21/2015 1:29 PM
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
## 2015-08-21 NOTE — [facility] Group Notes (Signed)
[facility] Group Notes:  (Nursing/MHT/Case Management/Adjunct)  Date:  08/21/2015    Time:  0930 Type of Therapy:  Nurse Education  Participation Level:  Active  Participation Quality:  Appropriate and Attentive  Affect:  Appropriate  Cognitive:  Alert and Appropriate  Insight:  Appropriate and Good  Engagement in Group:  Engaged  Modes of Intervention:  Activity, Discussion, Education and Exploration  Summary of Progress/Problems: Topic was on leisure and lifestyle changes. Discussed the importance of choosing a healthy leisure activities. Group encouraged to surround themselves with positive and healthy group/support system when changing to a healthy lifestyle. Patient was receptive and contributed.  Mart Piggs 08/21/2015, 12:37 PM

## 2015-08-21 NOTE — Progress Notes (Signed)
D: Pt presented less anxious and somatic this evening. Pt was not receptive to attending the evening wrap-up group. Pt walked out and declined to tell staff why. Pt denied any physical concerns during our initial interaction. Pt also denied any SI/HI/AVH. Pt noted to be irritable at times.  A: Writer administered scheduled and prn medications to pt, per MD orders. Continued support and availability as needed was extended to this pt. Staff continues to monitor pt with q34min checks.  R: No adverse drug reactions noted. Pt receptive to treatment. Pt remains safe at this time.

## 2015-08-21 NOTE — Progress Notes (Signed)
Adult Psychoeducational Group Note  Date:  08/21/2015 Time:  8:41 PM  Group Topic/Focus:  Wrap-Up Group:   The focus of this group is to help patients review their daily goal of treatment and discuss progress on daily workbooks.  Participation Level:  Active  Participation Quality:  Appropriate  Affect:  Appropriate  Cognitive:  Alert  Insight: Appropriate  Engagement in Group:  Engaged  Modes of Intervention:  Discussion  Additional Comments:  Patient stated having a better day. Patient stated something positive that happened today is "me learning new coping skills for balance".  Fawne Hughley L Avanti Jetter 08/21/2015, 8:41 PM

## 2015-08-22 LAB — HEMOGLOBIN A1C
Hgb A1c MFr Bld: 6.1 % — ABNORMAL HIGH (ref 4.8–5.6)
MEAN PLASMA GLUCOSE: 128 mg/dL

## 2015-08-22 LAB — PROLACTIN: Prolactin: 27.1 ng/mL — ABNORMAL HIGH (ref 4.8–23.3)
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
## 2015-08-22 NOTE — Progress Notes (Signed)
D: Pt presents with a brighter affect this evening. Pt was pleasant in interaction. Pt was observed as irritable during writer's previous interactions with pt. Pt reports having a "clearer" thought process. "Im alert". No physical complaints. Pt denied any SI/HI/AVH.  A: Writer administered scheduled and prn medications to pt, per MD orders. Continued support and availability as needed was extended to this pt. Staff continues to monitor pt with q34min checks.  R: No adverse drug reactions noted. Pt receptive to treatment. Pt remains safe at this time.

## 2015-08-22 NOTE — Progress Notes (Signed)
Recreation Therapy Notes  02.10.2017 Per MD order LRT met with patient to investigate ways to enhance tx during admission. Patient shared she became overwhelmed with life prior to admission. Patient guarded with answers initially, as she asked LRT to provide examples of stress inducing situations when LRT asked what specific stressors she has. 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 Patient also made sure to state that she has made all of the choices that make her stress level significant and she does not want to give anything up, but she realizes she is not handling stress well. Patient identified her goal for admission was to rest. Leisure activities of reading and TV were identified, as well as traveling around the world and experiencing other cultures. Patient reports an interest in yoga post d/c.   Due to patient admitted inability to self-regulate stress diaphragmatic breathing introduced. Patient provided instructions and education on diaphragmatic breathing. Patient practiced with LRT, voiced no concerns and demonstrated ability to practice independently.   MD requested patient be provided reading material, [employer] provided, as literature resources as scarce. Patient accepted book.   Laureen Ochs Zenna Traister, LRT/CTRS    Lane Hacker 08/22/2015 3:46 PM

## 2015-08-22 NOTE — Plan of Care (Signed)
Problem: Ineffective individual coping Goal: STG: Patient will remain free from self harm Outcome: Progressing Pt safe on the unit at this time     

## 2015-08-22 NOTE — BHH Group Notes (Signed)
Colleen Beach LCSW Group Therapy   08/22/2015 1:28 PM  Type of Therapy: Group Therapy  Participation Level:  Active  Participation Quality:  Attentive  Affect:  Flat  Cognitive:  Oriented  Insight:  Limited  Engagement in Therapy:  Engaged  Modes of Intervention:  Discussion and Socialization  Summary of Progress/Problems: Chaplain was here to lead a group on themes of hope and/or courage.  Pt came in a few minutes after group started. Pt was not very forthcoming with information but was able to articulate that she likes being able to sit in groups and listening to what people have to say. "Hearing everyone else gives me hope."  [name] 08/22/2015 1:28 PM

## 2015-08-22 NOTE — Progress Notes (Signed)
D Barbette Or is seen OOB UAL on the 500 hall today..shoulder tolerates this fair. She is guarded She is avoidant. She is guraded. A She completed her daily assessment and on it she wrote she deneid SI today and she rated her depression, hopelessness and anxeity  " 0/0/2" respectively.    A She attends her groups, is adamant that her meds not be changed.   R Safety maintained.

## 2015-08-22 NOTE — Progress Notes (Signed)
Adult Psychoeducational Group Note  Date:  08/22/2015 Time:  9:11 PM  Group Topic/Focus:  Wrap-Up Group:   The focus of this group is to help patients review their daily goal of treatment and discuss progress on daily workbooks.  Participation Level:  Active  Participation Quality:  Appropriate  Affect:  Appropriate  Cognitive:  Alert  Insight: Appropriate  Engagement in Group:  Engaged  Modes of Intervention:  Discussion  Additional Comments:  Patient goal for today was to rest and self care. On a scale between 1-10, (1=worst, 10=best) patient rated her day a 4-5 because "It's been a crazy day".  Beldon Nowling L Kamylle Axelson 08/22/2015, 9:11 PM

## 2015-08-22 NOTE — BHH Group Notes (Signed)
[facility] LCSW Aftercare Discharge Planning Group Note   08/22/2015 9:50 AM  Participation Quality:  Minimal  Mood/Affect:  Flat  Depression Rating:    Anxiety Rating:  "I always feel a little anxiety"  Thoughts of Suicide:  No Will you contract for safety?   NA  Current AVH:  Yes  Plan for Discharge/Comments:  Responds "OK" to everything.  "I want to go, but I am not sure where I am going to stay yet."  Transportation Means:   Supports:  Roque Lias B

## 2015-08-22 NOTE — Progress Notes (Signed)
D: Pt denies SI/HI/AVH. Pt is pleasant and cooperative. Pt stated she was doing better. Pt continues to be guarded and paranoid. Pt stated there were things going on but she did not want to discuss them.   A: Pt was offered support and encouragement. Pt was given scheduled medications. Pt was encourage to attend groups. Q 15 minute checks were done for safety.   R:Pt attends groups and interacts well with peers and staff. Pt is taking medication. Pt has no complaints at this time.Pt receptive to treatment and safety maintained on unit.

## 2015-08-23 LAB — URINALYSIS W MICROSCOPIC (NOT AT ARMC)
Bilirubin Urine: NEGATIVE
GLUCOSE, UA: NEGATIVE mg/dL
KETONES UR: NEGATIVE mg/dL
LEUKOCYTES UA: NEGATIVE
Nitrite: NEGATIVE
PROTEIN: 30 mg/dL — AB
Specific Gravity, Urine: 1.015 (ref 1.005–1.030)
pH: 8 (ref 5.0–8.0)

## 2015-08-23 LAB — URINE CULTURE: SPECIAL REQUESTS: NORMAL

## 2015-08-23 NOTE — BHH Group Notes (Signed)
[facility] Group Notes:  (Clinical Social Work)  08/23/2015  11:15-12:00PM  Summary of Progress/Problems:   Today's process group involved patients discussing their feelings related to being hospitalized, as well as how they can use their present feelings to create a plan for out how to stay out of the hospital in the future.  A variety of coping skills were discussed in more depth, including use of a pillbox and following up with a psychiatrist and therapist. The patient expressed that she had "no opinion" or feeling about being hospitalized.  She seemed to be confused when CSW asked her about being in school and how the previous CSW had left a hand-off stating she was in school and was invested in getting well so she could go home.  She said "I guess" when asked if she is in school.  She would not participate in the discussion, eventually fell asleep.  Type of Therapy:  Group Therapy - Process  Participation Level:  Minimal  Participation Quality:  Drowsy and Resistant  Affect:  Flat  Cognitive:  Confused  Insight:  Poor  Engagement in Therapy:  Poor  Modes of Intervention:  Exploration, Discussion  Selmer Dominion, LCSW 08/23/2015, 12:44 PM
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
## 2015-08-23 NOTE — [facility] Group Notes (Signed)
[place] Group Notes:  (Nursing/MHT/Case Management/Adjunct)  Date:  08/23/2015  Time:  1000  Type of Therapy:  Nurse Education  /  Life SKills ;  The group is focused on teaching patients how to identify their needs as well as to identify  Their healthy and unhealthy behaviors.  Participation Level:  Active  Participation Quality:  Appropriate  Affect:  Appropriate  Cognitive:  Alert  Insight:  Appropriate  Engagement in Group:  Engaged  Modes of Intervention:  Education  Summary of Progress/Problems:  Colleen Peck 08/23/2015, 11:10 AM

## 2015-08-23 NOTE — Progress Notes (Signed)
Colleen Peck is seen OOB UAL on the unit. She is seen walking around the unit today..with a blanket draped over her shoulders...like a cape. She is depressed, and has a flat, sad affect. She completed her daily assessment first thing this morning and on it she wrote she deneid SI and she rated her depression, hopelessness and anxiety " 0/0/0", respectively.    A When asked how she is feeling today she said " Im fine..Internal Medicine ready to go home. Do you know why I am here?". This Probation officer asked her what she can remember about her admission her and she replies " not a lot".... She says she she " was never suicidal" and that " all I know is I am ready to go home". WIll coninue to work on establishing trust with pt. R Safety in place.
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
## 2015-08-24 NOTE — Progress Notes (Signed)
D Patient is adamant that she is ready to go home and is " ok" now. She says " I dont know why I'm here...could you tell me please why I'm here". She is guarded. She is paranoid. She calims she is unsure of why she is here.  AShe did complete her daily assessment and on it she wrote  She deneid SI today and she rated her depresion, hopelessness and anxiety " 0/0/0", respectively. R She is encouraged to be open and honest..with staff and with herslef about her feeling sand her depression.  Safety  In place.

## 2015-08-24 NOTE — Progress Notes (Signed)
D: Pt denies SI/HI/AVH. Pt is concerned about leaving. " I don't want to be at the hospital" pt has no understanding about her Tx. Pt appears to be in denial about her situation. Pt continues to talk about her situation being un-fair. Pt more concerned about what was said and how pt got here, then trying to understand what she needs to be doing to get D/C.   A: Pt was offered support and encouragement. Pt was given scheduled medications. Pt was encourage to attend groups. Q 15 minute checks were done for safety.   R:Pt attends groups and interacts well with peers and staff. Pt is taking medication. Pt receptive to treatment and safety maintained on unit.

## 2015-08-24 NOTE — BHH Group Notes (Signed)
[place] Group Notes:  (Clinical Social Work)  08/24/2015  [place] Group Notes:  (Clinical Social Work)  08/24/2015  11:00AM-12:00PM  Summary of Progress/Problems:  The main focus of today's process group was to listen to a variety of genres of music and to identify that different types of music provoke different responses.  The patient then was able to identify personally what was soothing for them, as well as energizing.  The patient was late to group, and appeared to be resistant to it.  She left briefly, and then did return.  She stated she was annoyed by some of the music, and did not appear to appreciate that nonetheless it could be beneficial to some people, since we are all different.  On most songs she said she felt "nothing."  Type of Therapy:  Music Therapy   Participation Level:  Minimal  Participation Quality:  Resistant  Affect:  Blunted  Cognitive:  Oriented  Insight:  Poor  Engagement in Therapy:  Limited  Modes of Intervention:   Activity, Exploration  Selmer Dominion, LCSW 08/24/2015

## 2015-08-24 NOTE — Progress Notes (Signed)
Adult Psychoeducational Group Note  Date:  08/24/2015 Time:  9:15 PM  Group Topic/Focus:  Wrap-Up Group:   The focus of this group is to help patients review their daily goal of treatment and discuss progress on daily workbooks.  Participation Level:  Minimal  Participation Quality:  Resistant  Affect:  Flat  Cognitive:  Alert  Insight: None  Engagement in Group:  Poor  Modes of Intervention:  Discussion  Additional Comments:  Pt stated she did not wish to share her goal with the group, but stated that she was able to accomplish her goal.  Clint Bolder 08/24/2015, 9:15 PM

## 2015-08-24 NOTE — Progress Notes (Signed)
Adult Psychoeducational Group Note  Date:  08/24/2015 Time:  7:41 PM  Group Topic/Focus:  Wrap-Up Group:   The focus of this group is to help patients review their daily goal of treatment and discuss progress on daily workbooks.  Participation Level:  Did Not Attend  Additional Comments:  Pt initially came to group, then left after introductions were made.  Clint Bolder 08/24/2015, 7:41 PM

## 2015-08-25 ENCOUNTER — Ambulatory Visit: Payer: Self-pay | Admitting: Psychiatry

## 2015-08-25 ENCOUNTER — Ambulatory Visit: Payer: 59 | Admitting: Psychiatry

## 2015-08-25 ENCOUNTER — Ambulatory Visit (INDEPENDENT_AMBULATORY_CARE_PROVIDER_SITE_OTHER): Payer: BLUE CROSS/BLUE SHIELD | Admitting: Licensed Clinical Social Worker

## 2015-08-25 ENCOUNTER — Ambulatory Visit: Payer: 59 | Admitting: Licensed Clinical Social Worker

## 2015-08-25 DIAGNOSIS — F39 Unspecified mood [affective] disorder: Secondary | ICD-10-CM

## 2015-08-25 DIAGNOSIS — F332 Major depressive disorder, recurrent severe without psychotic features: Secondary | ICD-10-CM

## 2015-08-25 LAB — URINE CULTURE: SPECIAL REQUESTS: NORMAL

## 2015-08-25 MED ORDER — CITALOPRAM HYDROBROMIDE 10 MG PO TABS: 10.0000 mg | ORAL_TABLET | Freq: Every day | ORAL | Status: DC

## 2015-08-25 MED ORDER — LEVOTHYROXINE SODIUM 112 MCG PO TABS: 224.0000 ug | ORAL_TABLET | Freq: Every day | ORAL | Status: DC

## 2015-08-25 MED ORDER — TRAZODONE HCL 100 MG PO TABS: 100.0000 mg | ORAL_TABLET | Freq: Every evening | ORAL | Status: DC | PRN

## 2015-08-25 MED ORDER — HYDROXYZINE HCL 25 MG PO TABS: 25.0000 mg | ORAL_TABLET | Freq: Four times a day (QID) | ORAL | Status: DC | PRN

## 2015-08-25 MED ORDER — QUETIAPINE FUMARATE 25 MG PO TABS: 25.0000 mg | ORAL_TABLET | Freq: Every day | ORAL | Status: DC

## 2015-08-25 NOTE — Progress Notes (Signed)
  [facility] Adult Case Management Discharge Plan :  Will you be returning to the same living situation after discharge:  Yes,  home At discharge, do you have transportation home?: Yes,  family/froends Do you have the ability to pay for your medications: Yes,  insurance  Release of information consent forms completed and in the chart;  Patient's signature needed at discharge.  Patient to Follow up at: Follow-up Information    Follow up with [facility] . Go on 08/25/2015.   Why:  @2pm  for med management with Dr. Einar Grad and @3p  for therapy with Enid Cutter information:   [address], [place] [address] [phone]      Next level of care provider has access to [place]: unknown  Safety Planning and Suicide Prevention discussed: Yes,  yes  Have you used any form of tobacco in the last 30 days? (Cigarettes, Smokeless Tobacco, Cigars, and/or Pipes): No  Has patient been referred to the Quitline?: N/A patient is not a smoker  Patient has been referred for addiction treatment: N/A  Roque Lias B 08/25/2015, 10:08 AM
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
## 2015-08-25 NOTE — BHH Suicide Risk Assessment (Signed)
[place] INPATIENT:  Family/Significant Other Suicide Prevention Education  Suicide Prevention Education:  Education Completed; No one has been identified by the patient as the family member/significant other with whom the patient will be residing, and identified as the person(s) who will aid the patient in the event of a mental health crisis (suicidal ideations/suicide attempt).  With written consent from the patient, the family member/significant other has been provided the following suicide prevention education, prior to the and/or following the discharge of the patient.  The suicide prevention education provided includes the following:  Suicide risk factors  Suicide prevention and interventions  National Suicide Hotline telephone number  [facility] assessment telephone number  [facility] Emergency Assistance [place] and/or Residential Mobile Crisis Unit telephone number  Request made of family/significant other to:  Remove weapons (e.g., guns, rifles, knives), all items previously/currently identified as safety concern.    Remove drugs/medications (over-the-counter, prescriptions, illicit drugs), all items previously/currently identified as a safety concern.  The family member/significant other verbalizes understanding of the suicide prevention education information provided.  The family member/significant other agrees to remove the items of safety concern listed above. The patient did not endorse SI at the time of admission, nor did the patient c/o SI during the stay here.  SPE not required. However, I did talk with mother, Colleen Peck, [id number], and we went over a crises plan.  Colleen Peck B 08/25/2015, 10:06 AM
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
## 2015-08-25 NOTE — Progress Notes (Signed)
D: Pt denies SI/HI/AV. Pt is pleasant and cooperative. Pt ready to go, pt still in denial about her Tx, pt forwards little information  A: Pt was offered support and encouragement. Pt was given scheduled medications. Pt was encourage to attend groups. Q 15 minute checks were done for safety.  R:Pt attends groups and interacts well with peers and staff. Pt is taking medication. Pt receptive to treatment and safety maintained on unit.

## 2015-08-25 NOTE — Progress Notes (Signed)
D: Pt D/C as per MD's order and pt was picked up by her mother in the front lobby. Pt denies SI, HI,AVH and pain when assessed. Presents with congruent affect and mood. Attentive during D/C instructions and verbalized understanding.  A: Scheduled medication administered as per EMAR. D/C instructions done as per protocol. Verbal education done on prescription, medication samples and outside appointments. All belongings in locker 49 returned to pt at time of d/c. Q 15 minutes checks maintained for safety on and off unit without events till time of departure from facility.  R: Pt cooperative with d/c procedure. Signed belonging sheet in agreement with items received. Denies adverse drug reactions when assessed. Remains safe till time of d/c from facility.

## 2015-08-25 NOTE — Plan of Care (Signed)
Problem: Ineffective individual coping Goal: STG: Patient will remain free from self harm Outcome: Progressing Pt safe on the unit at this time     

## 2015-08-30 ENCOUNTER — Encounter (HOSPITAL_COMMUNITY): Payer: Self-pay | Admitting: Emergency Medicine

## 2015-08-30 ENCOUNTER — Emergency Department (INDEPENDENT_AMBULATORY_CARE_PROVIDER_SITE_OTHER)
Admission: EM | Admit: 2015-08-30 | Discharge: 2015-08-30 | Disposition: A | Discharge status: Home / Self Care | Payer: Self-pay | Source: Home / Self Care | Attending: Emergency Medicine | Admitting: Emergency Medicine

## 2015-08-30 DIAGNOSIS — F419 Anxiety disorder, unspecified: Secondary | ICD-10-CM

## 2015-08-30 DIAGNOSIS — N945 Secondary dysmenorrhea: Secondary | ICD-10-CM

## 2015-08-30 DIAGNOSIS — R1011 Right upper quadrant pain: Secondary | ICD-10-CM

## 2015-08-30 DIAGNOSIS — N9489 Other specified conditions associated with female genital organs and menstrual cycle: Secondary | ICD-10-CM

## 2015-08-30 LAB — POCT PREGNANCY, URINE: PREG TEST UR: NEGATIVE

## 2015-08-30 MED ORDER — KETOROLAC TROMETHAMINE 60 MG/2ML IM SOLN
INTRAMUSCULAR | Status: AC
  Filled 2015-08-30: qty 2

## 2015-08-30 MED ORDER — NAPROXEN 375 MG PO TABS: 375.0000 mg | ORAL_TABLET | Freq: Two times a day (BID) | ORAL | Status: DC

## 2015-08-30 MED ORDER — KETOROLAC TROMETHAMINE 60 MG/2ML IM SOLN
60.0000 mg | Freq: Once | INTRAMUSCULAR | Status: AC
  Administered 2015-08-30: 60 mg via INTRAMUSCULAR

## 2015-08-30 NOTE — ED Notes (Signed)
Was asked by front staff to assess pt for dizziness and palpitations Pt denies dizziness and palpitations... C/o intermittent left flank pain onset x3-5 months Also c/o "feeling overwhelmed with many decisions" such as school and the place where she is living Seen at behavioral health on 2/6. Denies SI... She is crying, lying down on bed... No acute distress.

## 2015-08-30 NOTE — ED Provider Notes (Signed)
CSN: [id number]     Arrival date & time 08/30/15  1628 History   First MD Initiated Contact with Patient 08/30/15 1746     Chief Complaint  Patient presents with  . Depression   (Consider location/radiation/quality/duration/timing/severity/associated sxs/prior Treatment) HPI Comments: 35 year old female who has 2 concerns that she wishes to discuss in the urgent care. She states that she has had her menstrual period for 3 weeks associated with spotting and suprapubic/pelvic cramping. Sometime she feels cold and dizzy. She occasionally has premenstrual cramping. But this is worse than usual. Cramping associated with menses has been occurring for several months off and on.  The second concern is that of pain in the right upper quadrant also for several months. It is intermittent. It is not associated with other symptoms. Denies fever or chills. She states that she has seen her PCP in the last few weeks but forgets to mention it to him.  1910 hrs. The patient has received her Toradol injection. I asked her about the anxiety that she had originally complained. She pulse to 4 approximate 30 seconds before speaking and stated that she did have anxiety but she is feeling much better now.   Past Medical History  Diagnosis Date  . Asthma   . Seasonal allergies   . Depression   . PTSD (post-traumatic stress disorder)   . Thyroid disease 2009    Graves disease (pt reported resolved); hypothyriodism  . Abnormal pap     pt reports abnl pap many years ago.  Nl since then.  . Palpitations 03/12/2008   Past Surgical History  Procedure Laterality Date  . Dilation and curettage of uterus  March 2006   Family History  Problem Relation Age of Onset  . Drug abuse Father   . Depression Maternal Aunt   . Depression Maternal Grandmother   . Anxiety disorder Maternal Grandmother   . COPD Maternal Grandmother   . Suicidality Cousin   . Depression Cousin   . Bipolar disorder Cousin   . Depression  Maternal Aunt   . Hypertension Mother   . Diabetes Paternal Grandfather   . COPD Paternal Grandmother   . Heart disease Neg Hx    Social History  Substance Use Topics  . Smoking status: Never Smoker   . Smokeless tobacco: Never Used  . Alcohol Use: 0.6 oz/week    1 Glasses of wine per week     Comment: past use of alcohol in '08-'09   OB History    No data available     Review of Systems  Constitutional: Positive for fatigue. Negative for fever and activity change.  HENT: Negative.   Respiratory: Negative.  Negative for cough and shortness of breath.   Cardiovascular: Negative for chest pain.  Gastrointestinal: Negative for nausea, vomiting and diarrhea.  Genitourinary: Positive for pelvic pain. Negative for dysuria and frequency.  Skin: Negative.   Neurological: Positive for dizziness.  Psychiatric/Behavioral:       No psychiatric symptoms were mention to me during the history.    Allergies  No known allergies  Home Medications   Prior to Admission medications   Medication Sig Start Date End Date Taking? Authorizing Provider  citalopram (CELEXA) 10 MG tablet Take 1 tablet (10 mg total) by mouth daily. 08/25/15  Yes Derrill Center, NP  levothyroxine (SYNTHROID, LEVOTHROID) 112 MCG tablet Take 2 tablets (224 mcg total) by mouth at bedtime. 08/25/15  Yes Derrill Center, NP  QUEtiapine (SEROQUEL) 25 MG tablet Take 1  tablet (25 mg total) by mouth at bedtime. Take 2 tablet (50mg  total) by mouth 3 times a day as needed for anxiety. 08/25/15  Yes Derrill Center, NP  traZODone (DESYREL) 100 MG tablet Take 1 tablet (100 mg total) by mouth at bedtime as needed for sleep. 08/25/15  Yes Derrill Center, NP  albuterol (PROVENTIL HFA) 108 (90 Base) MCG/ACT inhaler Inhale 2 puffs into the lungs every 6 (six) hours as needed. For shortness of breath, wheezing. 08/06/15   Janora Norlander, DO  beclomethasone (QVAR) 40 MCG/ACT inhaler Inhale 1 puff into the lungs 2 (two) times daily. 03/01/15    Kerrie Buffalo, NP  ferrous sulfate 325 (65 FE) MG tablet Take 325 mg by mouth daily with breakfast.    Historical Provider, MD  fluticasone (FLONASE) 50 MCG/ACT nasal spray Place 2 sprays into both nostrils daily. 04/15/15   Ashly Windell Moulding, DO  hydrOXYzine (ATARAX/VISTARIL) 25 MG tablet Take 1 tablet (25 mg total) by mouth every 6 (six) hours as needed for anxiety. 08/25/15   Derrill Center, NP  naproxen (NAPROSYN) 375 MG tablet Take 1 tablet (375 mg total) by mouth 2 (two) times daily. 08/30/15   Janne Napoleon, NP   Meds Ordered and Administered this Visit   Medications  ketorolac (TORADOL) injection 60 mg (60 mg Intramuscular Given 08/30/15 1858)    BP 129/67 mmHg  Pulse 100  Temp(Src) 98.8 F (37.1 C) (Oral)  Resp 22  SpO2 100%  LMP 08/17/2015 (Exact Date) No data found.   Physical Exam  Constitutional: She is oriented to person, place, and time. She appears well-developed and well-nourished. No distress.  Eyes: EOM are normal.  Neck: Normal range of motion. Neck supple.  Cardiovascular: Normal rate, regular rhythm and normal heart sounds.   Pulmonary/Chest: Effort normal and breath sounds normal. No respiratory distress.  Abdominal: Soft. Bowel sounds are normal. She exhibits no distension and no mass. There is no tenderness. There is no rebound and no guarding.  Genitourinary:  Anterior suprapubic palpation reveals tenderness at the midline.  Musculoskeletal: Normal range of motion.  Neurological: She is alert and oriented to person, place, and time. She exhibits normal muscle tone.  Skin: Skin is warm and dry.  Psychiatric: She has a normal mood and affect.  Nursing note and vitals reviewed.   ED Course  Procedures (including critical care time)  Labs Review Labs Reviewed  POCT PREGNANCY, URINE   Results for orders placed or performed during the hospital encounter of 08/30/15  Pregnancy, urine POC  Result Value Ref Range   Preg Test, Ur NEGATIVE NEGATIVE      Imaging Review No results found.   Visual Acuity Review  Right Eye Distance:   Left Eye Distance:   Bilateral Distance:    Right Eye Near:   Left Eye Near:    Bilateral Near:         MDM   1. Secondary dysmenorrhea   2. Uterine cramping   3. Anxiety   4. RUQ abdominal pain    Patient is given resources to follow-up with her PCP on Monday. She may also follow-up with the on-call GYN for her dysmenorrhea. If this becomes an emergency for her she can go to the [facility]. Although she states that her anxiety has improved since she has been here she is advised to go to behavioral health early next week if needed or if she believes she has an emergency regarding her anxiety or depression she  should go to [facility]. Naprosyn as directed for menstrual cramps. Was administered Toradol 60 mg IM in the urgent care. The right upper quadrant intermittent pain that she has had for several months well need to be followed up by her PCP. Suspect she will need ultrasound at some point. This is not an acute, today.    Janne Napoleon, NP 08/30/15 1920

## 2015-08-30 NOTE — ED Notes (Signed)
Pt said she will be leaving and could no longer wait... Adv her that she is next to be seen and that the provider was coming Pt left... Notified Youlanda Roys, NP

## 2015-08-30 NOTE — ED Notes (Signed)
Pt reports she is feeling better.

## 2015-08-30 NOTE — Discharge Instructions (Signed)
Abdominal Pain, Adult Many things can cause abdominal pain. Usually, abdominal pain is not caused by a disease and will improve without treatment. It can often be observed and treated at home. Your health care provider will do a physical exam and possibly order blood tests and X-rays to help determine the seriousness of your pain. However, in many cases, more time must pass before a clear cause of the pain can be found. Before that point, your health care provider may not know if you need more testing or further treatment. HOME CARE INSTRUCTIONS Monitor your abdominal pain for any changes. The following actions may help to alleviate any discomfort you are experiencing:  Only take over-the-counter or prescription medicines as directed by your health care provider.  Do not take laxatives unless directed to do so by your health care provider.  Try a clear liquid diet (broth, tea, or water) as directed by your health care provider. Slowly move to a bland diet as tolerated. SEEK MEDICAL CARE IF:  You have unexplained abdominal pain.  You have abdominal pain associated with nausea or diarrhea.  You have pain when you urinate or have a bowel movement.  You experience abdominal pain that wakes you in the night.  You have abdominal pain that is worsened or improved by eating food.  You have abdominal pain that is worsened with eating fatty foods.  You have a fever. SEEK IMMEDIATE MEDICAL CARE IF:  Your pain does not go away within 2 hours.  You keep throwing up (vomiting).  Your pain is felt only in portions of the abdomen, such as the right side or the left lower portion of the abdomen.  You pass bloody or black tarry stools. MAKE SURE YOU:  Understand these instructions.  Will watch your condition.  Will get help right away if you are not doing well or get worse.   This information is not intended to replace advice given to you by your health care provider. Make sure you discuss  any questions you have with your health care provider.   Document Released: 04/07/2005 Document Revised: 03/19/2015 Document Reviewed: 03/07/2013 Elsevier Interactive Patient Education 2016 Elsevier Inc.  Dysmenorrhea If this is worse you may go to the [place] is pain during a menstrual period. You will have pain in the lower belly (abdomen). The pain is caused by the tightening (contracting) of the muscles of the uterus. The pain can be minor or severe. Headache, feeling sick to your stomach (nausea), throwing up (vomiting), or low back pain may occur with this condition. HOME CARE  Only take medicine as told by your doctor.  Place a heating pad or hot water bottle on your lower back or belly. Do not sleep with a heating pad.  Exercise may help lessen the pain.  Massage the lower back or belly.  Stop smoking.  Avoid alcohol and caffeine. GET HELP IF:   Your pain does not get better with medicine.  You have pain during sex.  Your pain gets worse while taking pain medicine.  Your period bleeding is heavier than normal.  You keep feeling sick to your stomach or keep throwing up. GET HELP RIGHT AWAY IF: You pass out (faint).   This information is not intended to replace advice given to you by your health care provider. Make sure you discuss any questions you have with your health care provider.   Document Released: 09/24/2008 Document Revised: 07/03/2013 Document Reviewed: 12/14/2012 Elsevier Interactive Patient Education 2016 Elsevier  Inc.  Generalized Anxiety Disorder Follow with Behavioral Health or if you believe having emergency go to the [facility] for help.  Generalized anxiety disorder (GAD) is a mental disorder. It interferes with life functions, including relationships, work, and school. GAD is different from normal anxiety, which everyone experiences at some point in their lives in response to specific life events and activities.  Normal anxiety actually helps [place] prepare for and get through these life events and activities. Normal anxiety goes away after the event or activity is over.  GAD causes anxiety that is not necessarily related to specific events or activities. It also causes excess anxiety in proportion to specific events or activities. The anxiety associated with GAD is also difficult to control. GAD can vary from mild to severe. People with severe GAD can have intense waves of anxiety with physical symptoms (panic attacks).  SYMPTOMS The anxiety and worry associated with GAD are difficult to control. This anxiety and worry are related to many life events and activities and also occur more days than not for 6 months or longer. People with GAD also have three or more of the following symptoms (one or more in children):  Restlessness.   Fatigue.  Difficulty concentrating.   Irritability.  Muscle tension.  Difficulty sleeping or unsatisfying sleep. DIAGNOSIS GAD is diagnosed through an assessment by your health care provider. Your health care provider will ask you questions aboutyour mood,physical symptoms, and events in your life. Your health care provider may ask you about your medical history and use of alcohol or drugs, including prescription medicines. Your health care provider may also do a physical exam and blood tests. Certain medical conditions and the use of certain substances can cause symptoms similar to those associated with GAD. Your health care provider may refer you to a mental health specialist for further evaluation. TREATMENT The following therapies are usually used to treat GAD:   Medication. Antidepressant medication usually is prescribed for long-term daily control. Antianxiety medicines may be added in severe cases, especially when panic attacks occur.   Talk therapy (psychotherapy). Certain types of talk therapy can be helpful in treating GAD by providing support, education, and  guidance. A form of talk therapy called cognitive behavioral therapy can teach you healthy ways to think about and react to daily life events and activities.  Stress managementtechniques. These include yoga, meditation, and exercise and can be very helpful when they are practiced regularly. A mental health specialist can help determine which treatment is best for you. Some people see improvement with one therapy. However, other people require a combination of therapies.   This information is not intended to replace advice given to you by your health care provider. Make sure you discuss any questions you have with your health care provider.   Document Released: 10/23/2012 Document Revised: 07/19/2014 Document Reviewed: 10/23/2012 Elsevier Interactive Patient Education [employer].

## 2015-08-31 ENCOUNTER — Telehealth: Payer: Self-pay | Admitting: Family Medicine

## 2015-08-31 NOTE — Telephone Encounter (Signed)
After hours telephone call  Sister called because patient is having cramping pain.  Having "pain in her uterus" for ~1 wk. Was seen at [facility] yesterday and treated with Toradol for dysmenorrhea.  Was given seroquel and celexa recently at [facility] for suicidal ideation.  Hasn't taken medicines today because she thionks this is causing cramps.  Advised patient to take her medications (these are not causing cramping), take ibuprofen for pain, and call clinic in AM to make appt.  Advised that she should go to ED if she develops fever or pain becomes severe.  Virginia Crews, MD, MPH PGY-2,  [facility] 08/31/2015 7:08 PM

## 2015-09-01 ENCOUNTER — Encounter: Payer: Self-pay | Admitting: Psychiatry

## 2015-09-01 ENCOUNTER — Ambulatory Visit (INDEPENDENT_AMBULATORY_CARE_PROVIDER_SITE_OTHER): Payer: BLUE CROSS/BLUE SHIELD | Admitting: Psychiatry

## 2015-09-01 ENCOUNTER — Ambulatory Visit (INDEPENDENT_AMBULATORY_CARE_PROVIDER_SITE_OTHER): Payer: BLUE CROSS/BLUE SHIELD | Admitting: Licensed Clinical Social Worker

## 2015-09-01 ENCOUNTER — Inpatient Hospital Stay
Admission: EM | Admit: 2015-09-01 | Discharge: 2015-09-08 | DRG: 885 | Disposition: A | Discharge status: Home / Self Care | Payer: No Typology Code available for payment source | Source: Intra-hospital | Attending: Psychiatry | Admitting: Psychiatry

## 2015-09-01 ENCOUNTER — Ambulatory Visit: Payer: Self-pay | Admitting: Family Medicine

## 2015-09-01 DIAGNOSIS — Z6839 Body mass index (BMI) 39.0-39.9, adult: Secondary | ICD-10-CM | POA: Diagnosis not present

## 2015-09-01 DIAGNOSIS — E669 Obesity, unspecified: Secondary | ICD-10-CM | POA: Diagnosis present

## 2015-09-01 DIAGNOSIS — Z9889 Other specified postprocedural states: Secondary | ICD-10-CM

## 2015-09-01 DIAGNOSIS — Z818 Family history of other mental and behavioral disorders: Secondary | ICD-10-CM | POA: Diagnosis not present

## 2015-09-01 DIAGNOSIS — Z833 Family history of diabetes mellitus: Secondary | ICD-10-CM | POA: Diagnosis not present

## 2015-09-01 DIAGNOSIS — J45909 Unspecified asthma, uncomplicated: Secondary | ICD-10-CM | POA: Diagnosis present

## 2015-09-01 DIAGNOSIS — Z79899 Other long term (current) drug therapy: Secondary | ICD-10-CM

## 2015-09-01 DIAGNOSIS — E039 Hypothyroidism, unspecified: Secondary | ICD-10-CM | POA: Diagnosis present

## 2015-09-01 DIAGNOSIS — D259 Leiomyoma of uterus, unspecified: Secondary | ICD-10-CM | POA: Diagnosis present

## 2015-09-01 DIAGNOSIS — F3160 Bipolar disorder, current episode mixed, unspecified: Secondary | ICD-10-CM | POA: Diagnosis present

## 2015-09-01 DIAGNOSIS — F4310 Post-traumatic stress disorder, unspecified: Secondary | ICD-10-CM | POA: Diagnosis present

## 2015-09-01 DIAGNOSIS — N921 Excessive and frequent menstruation with irregular cycle: Secondary | ICD-10-CM

## 2015-09-01 DIAGNOSIS — D649 Anemia, unspecified: Secondary | ICD-10-CM | POA: Diagnosis present

## 2015-09-01 DIAGNOSIS — F411 Generalized anxiety disorder: Secondary | ICD-10-CM | POA: Diagnosis present

## 2015-09-01 DIAGNOSIS — Z7951 Long term (current) use of inhaled steroids: Secondary | ICD-10-CM

## 2015-09-01 DIAGNOSIS — D509 Iron deficiency anemia, unspecified: Secondary | ICD-10-CM | POA: Diagnosis present

## 2015-09-01 DIAGNOSIS — G4700 Insomnia, unspecified: Secondary | ICD-10-CM | POA: Diagnosis present

## 2015-09-01 DIAGNOSIS — F22 Delusional disorders: Secondary | ICD-10-CM | POA: Diagnosis present

## 2015-09-01 DIAGNOSIS — Z8249 Family history of ischemic heart disease and other diseases of the circulatory system: Secondary | ICD-10-CM

## 2015-09-01 DIAGNOSIS — F319 Bipolar disorder, unspecified: Secondary | ICD-10-CM | POA: Diagnosis not present

## 2015-09-01 DIAGNOSIS — F3162 Bipolar disorder, current episode mixed, moderate: Secondary | ICD-10-CM

## 2015-09-01 DIAGNOSIS — Z825 Family history of asthma and other chronic lower respiratory diseases: Secondary | ICD-10-CM | POA: Diagnosis not present

## 2015-09-01 DIAGNOSIS — F39 Unspecified mood [affective] disorder: Secondary | ICD-10-CM | POA: Diagnosis not present

## 2015-09-01 MED ORDER — HYDROXYZINE HCL 25 MG PO TABS: 25.0000 mg | ORAL_TABLET | Freq: Four times a day (QID) | ORAL | Status: DC | PRN

## 2015-09-01 MED ORDER — NAPROXEN 375 MG PO TABS
375.0000 mg | ORAL_TABLET | Freq: Two times a day (BID) | ORAL | Status: DC
  Administered 2015-09-01 – 2015-09-08 (×14): 375 mg via ORAL
  Filled 2015-09-01 (×15): qty 1

## 2015-09-01 MED ORDER — CITALOPRAM HYDROBROMIDE 20 MG PO TABS
20.0000 mg | ORAL_TABLET | Freq: Every day | ORAL | Status: DC
  Administered 2015-09-01: 20 mg via ORAL
  Filled 2015-09-01 (×2): qty 1

## 2015-09-01 MED ORDER — FLUTICASONE PROPIONATE 50 MCG/ACT NA SUSP
2.0000 | Freq: Every day | NASAL | Status: DC
  Administered 2015-09-04 – 2015-09-08 (×5): 2 via NASAL
  Filled 2015-09-01: qty 16

## 2015-09-01 MED ORDER — ALBUTEROL SULFATE HFA 108 (90 BASE) MCG/ACT IN AERS
2.0000 | INHALATION_SPRAY | Freq: Four times a day (QID) | RESPIRATORY_TRACT | Status: DC | PRN
  Administered 2015-09-05: 2 via RESPIRATORY_TRACT
  Filled 2015-09-01 (×2): qty 6.7

## 2015-09-01 MED ORDER — ALUM & MAG HYDROXIDE-SIMETH 200-200-20 MG/5ML PO SUSP: 30.0000 mL | ORAL | Status: DC | PRN

## 2015-09-01 MED ORDER — TRAZODONE HCL 100 MG PO TABS: 100.0000 mg | ORAL_TABLET | Freq: Every evening | ORAL | Status: DC | PRN

## 2015-09-01 MED ORDER — QUETIAPINE FUMARATE 100 MG PO TABS
100.0000 mg | ORAL_TABLET | Freq: Every day | ORAL | Status: DC
  Administered 2015-09-01: 100 mg via ORAL
  Filled 2015-09-01: qty 1

## 2015-09-01 MED ORDER — ACETAMINOPHEN 325 MG PO TABS
650.0000 mg | ORAL_TABLET | Freq: Four times a day (QID) | ORAL | Status: DC | PRN
  Administered 2015-09-02: 650 mg via ORAL
  Filled 2015-09-01 (×2): qty 2

## 2015-09-01 MED ORDER — LEVOTHYROXINE SODIUM 112 MCG PO TABS
112.0000 ug | ORAL_TABLET | Freq: Every day | ORAL | Status: DC
  Administered 2015-09-02 – 2015-09-04 (×3): 112 ug via ORAL
  Filled 2015-09-01 (×5): qty 1

## 2015-09-01 MED ORDER — MAGNESIUM HYDROXIDE 400 MG/5ML PO SUSP
30.0000 mL | Freq: Every day | ORAL | Status: DC | PRN
  Administered 2015-09-05 – 2015-09-06 (×2): 30 mL via ORAL
  Filled 2015-09-01 (×2): qty 30

## 2015-09-01 MED ORDER — FERROUS SULFATE 325 (65 FE) MG PO TABS
325.0000 mg | ORAL_TABLET | Freq: Every day | ORAL | Status: DC
  Administered 2015-09-02 – 2015-09-08 (×7): 325 mg via ORAL
  Filled 2015-09-01 (×7): qty 1
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
## 2015-09-01 NOTE — Tx Team (Addendum)
Initial Interdisciplinary Treatment Plan   PATIENT STRESSORS: Financial difficulties Medication change or noncompliance   PATIENT STRENGTHS: Average or above average intelligence Capable of independent living Communication skills   PROBLEM LIST: Problem List/Patient Goals Date to be addressed Date deferred Reason deferred Estimated date of resolution  PTSD 09/01/2015     Anxiety disorder 09/01/2015           "I just felt overwhelmed"                                     DISCHARGE CRITERIA:  Ability to meet basic life and health needs Adequate post-discharge living arrangements Verbal commitment to aftercare and medication compliance  PRELIMINARY DISCHARGE PLAN: Return to previous living arrangement Return to previous work or school arrangements  PATIENT/FAMIILY INVOLVEMENT: This treatment plan has been presented to and reviewed with the patient, Vergie Maguire, and/or family member, .  The patient and family have been given the opportunity to ask questions and make suggestions.  Rica Records Maniattu 09/01/2015, 6:29 PM

## 2015-09-01 NOTE — Progress Notes (Signed)
35 yrs old female admitted from out patient office with PTSD & anxiety disorder.Body search & skin assessment done.No contraband found.States "I am tired."Denies suicidal ideations now.Oriented to unit.

## 2015-09-02 ENCOUNTER — Encounter: Payer: Self-pay | Admitting: Psychiatry

## 2015-09-02 ENCOUNTER — Telehealth: Payer: Self-pay | Admitting: Licensed Clinical Social Worker

## 2015-09-02 ENCOUNTER — Ambulatory Visit: Payer: Self-pay | Admitting: Family Medicine

## 2015-09-02 ENCOUNTER — Telehealth: Payer: Self-pay | Admitting: Psychiatry

## 2015-09-02 DIAGNOSIS — F3162 Bipolar disorder, current episode mixed, moderate: Secondary | ICD-10-CM

## 2015-09-02 MED ORDER — LORAZEPAM 1 MG PO TABS
1.0000 mg | ORAL_TABLET | Freq: Every day | ORAL | Status: DC
  Administered 2015-09-02: 1 mg via ORAL
  Filled 2015-09-02: qty 1

## 2015-09-02 MED ORDER — ARIPIPRAZOLE 15 MG PO TABS
15.0000 mg | ORAL_TABLET | Freq: Every day | ORAL | Status: DC
  Administered 2015-09-02 – 2015-09-05 (×4): 15 mg via ORAL
  Filled 2015-09-02 (×4): qty 1

## 2015-09-02 NOTE — BHH Suicide Risk Assessment (Signed)
[place] INPATIENT:  Family/Significant Other Suicide Prevention Education  Suicide Prevention Education:  Education Completed; Ermalene Lemanski (mother) [phone] has been identified by the patient as the family member/significant other with whom the patient will be residing, and identified as the person(s) who will aid the patient in the event of a mental health crisis (suicidal ideations/suicide attempt).  With written consent from the patient, the family member/significant other has been provided the following suicide prevention education, prior to the and/or following the discharge of the patient.  The suicide prevention education provided includes the following:  Suicide risk factors  Suicide prevention and interventions  National Suicide Hotline telephone number  [facility] assessment telephone number  [facility] Emergency Assistance [place] and/or Residential Mobile Crisis Unit telephone number  Request made of family/significant other to:  Remove weapons (e.g., guns, rifles, knives), all items previously/currently identified as safety concern.    Remove drugs/medications (over-the-counter, prescriptions, illicit drugs), all items previously/currently identified as a safety concern.  The family member/significant other verbalizes understanding of the suicide prevention education information provided.  The family member/significant other agrees to remove the items of safety concern listed above.  Keene Breath, MSW, [place] 09/02/2015, 12:49 PM

## 2015-09-02 NOTE — Progress Notes (Signed)
   THERAPIST PROGRESS NOTE  Session Time: 62min  Participation Level: Active  Behavioral Response: CasualAlertDepressed  Type of Therapy: Individual Therapy  Treatment Goals addressed: Coping  Interventions: CBT, Motivational Interviewing, Solution Focused, Supportive, Family Systems and Reframing  Summary: Colleen Peck is a 35 y.o. female who presents with continued symptoms of her diagnosis. Discussion of her hospitalization and her departure AMA. Discussion of triggers that lead to her decision to go into hospital.  Explored coping strategies to utilize to reduce hospitalization.  Discussion of stressors and support system.  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  Explored medication regimen currently and in [place].  Discussion of support system in [place].  Suicidal/Homicidal: Nowithout intent/plan  Therapist Response: LCSW provided Patient with ongoing emotional support and encouragement.  Normalized her feelings.  Commended Patient on her progress and reinforced the importance of client staying focused on her own strengths and resources and resiliency. Processed various strategies for dealing with stressors.    Plan: Return again in 1 weeks.  Diagnosis: Axis I: Mild Mood Disorder    Axis II: No diagnosis    Lubertha [place] 08/25/2015

## 2015-09-02 NOTE — Telephone Encounter (Signed)
Patient`s mother called and wanted to check on patient`s status and after care plan. She was advised to check with the attending Psychiatrist in the hospital and request a family meeting. She was advised that after patient is discharged from the inpatient unit and if she gives consent we can haveng to discuss her aftercare

## 2015-09-02 NOTE — Progress Notes (Signed)
D: Pt seen resting in bed this evening. Denies SI/HI/AVH at this time. Denies pain. Pt states "I just felt overwhelmed." Pt appears very tired. Pt asks Probation officer about gynecology consult tomorrow and is assured that there is an order placed for a consult.  A: Emotional support and encouragement provided. Medications administered with education. q15 minute safety checks maintained. R: Pt remains free from harm.

## 2015-09-02 NOTE — Progress Notes (Signed)
Recreation Therapy Notes  Date: 02.21.17 Time: 3:00 pm Location: Community Room  Group Topic: Goal Setting  Goal Area(s) Addresses:  Patient will write down one goal. Patient will verbalize benefit of setting goals.  Behavioral Response: Attentive, Interactive  Intervention: Step By Step  Activity: Patients were given a worksheet with a foot on it. Patients were instructed to write a goal inside the foot and write positive or encouraging statements on the outside of the foot.  Education: LRT educated patients on ways they can achieve their goal.  Education Outcome: Acknowledges education/In group clarification offered   Clinical Observations/Feedback: Patient wrote goal and positive words. Patient contributed to group discussion by stating that it was easy to think of a goal, what steps are you taking to reach your goal, that it was hard to think of positive words, that it can be difficult to find intrinsic motivation, and how setting goals benefit your life post d/c.  Leonette Monarch, LRT/CTRS 09/02/2015 4:27 PM

## 2015-09-02 NOTE — BHH Group Notes (Signed)
[place] LCSW Group Therapy  09/02/2015 2:42 PM  Type of Therapy:  Group Therapy  Participation Level:  Did Not Attend  Summary of Progress/Problems: Patient was called to group but did not attend.   Keene Breath, MSW, LCSWA 09/02/2015, 2:42 PM

## 2015-09-02 NOTE — Telephone Encounter (Signed)
Discussed with mother about medication regimen and patient's inability to answer questions.  Discussion of appropriate level of care & her upcoming trip to [place].

## 2015-09-02 NOTE — Progress Notes (Signed)
Recreation Therapy Notes  INPATIENT RECREATION THERAPY ASSESSMENT  Patient Details Name: Yanira Swartwout MRN: [id number] DOB: 17-Oct-1980 Today's Date: 09/02/2015  Patient Stressors: Family, Friends, Work, School, Other (Comment) (Family can be stressful because they don't always respect opinions; lack of supportive friends; work can be overwhelming; Scientist, product/process development at [employer]; overwhelmed, felt stuck, felt like she was carrying a load, but she did not want to - wants to be free)  Coping Skills:   Isolate, Arguments, Substance Abuse, Avoidance, Art/Dance, Talking, Music, Sports, Other (Comment) (Patient reported she drank in November 2016 to cope with issues and she has smoked marijuana. Patient denied current use of substances.)  Personal Challenges: Anger, Communication, Concentration, Decision-Making, Expressing Yourself, Problem-Solving, Relationships, School Performance, Self-Esteem/Confidence, Stress Management, Time Management, Trusting Others, Work Midwife (2+):  Individual - Other (Comment) (Sing, dance)  Awareness of Community Resources:  Yes  Community Resources:  Park  Current Use: No  If no, Barriers?: Other (Comment) (Stuck in the house)  Patient Strengths:  Hair, face, smile "I love me"  Patient Identified Areas of Improvement:  How she treats others, love better  Current Recreation Participation:  Painting, watch TV, talk on the phone, search the internet  Patient Goal for Hospitalization:  To take time and relax  [place] of Residence:  [place] of Residence:  [place]   Current [place] (including self-harm):  No  Current HI:  No  Consent to Intern Participation: N/A   Leonette Monarch, LRT/CTRS 09/02/2015, 1:58 PM

## 2015-09-02 NOTE — BHH Group Notes (Signed)
[place] Group Notes:  (Nursing/MHT/Case Management/Adjunct)  Date:  09/02/2015  Time:  2:08 PM  Type of Therapy:  Psychoeducational Skills  Participation Level:  Did Not Attend   Adela Lank [facility] 09/02/2015, 2:08 PM
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
## 2015-09-02 NOTE — Progress Notes (Signed)
Patient ID: Colleen Peck, female   DOB: Nov 23, 1980, 35 y.o.   MRN: [id number]   Patient was not seen due to admission into [place] unit.  Spoke to her mother on the telephone about a higher level of treatment.

## 2015-09-02 NOTE — Tx Team (Signed)
Interdisciplinary Treatment Plan Update (Adult)  Date:  09/02/2015 Time Reviewed:  12:58 PM  Progress in Treatment: Attending groups: No. Participating in groups:  No. Taking medication as prescribed:  Yes. Tolerating medication:  Yes. Family/Significant othe contact made:  Yes, individual(s) contacted:  patient's mother Patient understands diagnosis:  No. It is not clear at this time if patient has an understanding. Discussing patient identified problems/goals with staff:  Yes. Medical problems stabilized or resolved:  Yes. Denies suicidal/homicidal ideation: Yes. Issues/concerns per patient self-inventory:  Yes. Other:  New problem(s) identified: No, Describe:  none reported  Discharge Plan or Barriers: Patient will stabilize on medications and discharge home with family and has outpatient provider  Reason for Continuation of Hospitalization: Depression Medication stabilization  Comments:  Estimated length of stay: 3-5 days  New goal(s):  Review of initial/current patient goals per problem list:   1.  Goal(s): Participate in aftercare plan   Met:  No  Target date: by discharge  As evidenced by: patient will participate in aftercare plan AEB aftercare provider and housing plan identified at discharge 09/02/15: patient has a follow up provider but needs to decide if she will discharge home to her apt alone or with her mother  2.  Goal (s): Decrease depression   Met:  No  Target date: by discharge  As evidenced by: patient demonstrates decreased symptoms of depression and reports a Depression rating of 3 or less 09/02/15: patient denies SI and is started on medication but reports low energy levels and "just want to rest"   Attendees: Physician:  Andrea Hernandez, MD 2/21/201712:58 PM  Nursing:   Gwen Farrish, RN 2/21/201712:58 PM  Other:   , LCSWA 2/21/201712:58 PM  Other:   2/21/201712:58 PM  Other:   2/21/201712:58 PM  Other:  2/21/201712:58 PM   Other:  2/21/201712:58 PM  Other:  2/21/201712:58 PM  Other:  2/21/201712:58 PM  Other:  2/21/201712:58 PM  Other:  2/21/201712:58 PM  Other:   2/21/201712:58 PM   Scribe for Treatment Team:   ,  T, MSW, LCSWA  09/02/2015, 12:58 PM [phone] 
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
## 2015-09-02 NOTE — Plan of Care (Signed)
Problem: Ineffective individual coping Goal: STG: Patient will remain free from self harm Outcome: Progressing Pt remains free from harm

## 2015-09-02 NOTE — Progress Notes (Signed)
D:  Patient paces halls and smiles.  Denies SI/HI/AVH. Speech is tangential and rambling.   When asked why she is here states "I was feeling overwhelmed and afraid of having to live in the world alone."  Unable to clarify what she she meant by the statement.  As continued to talk patient would periodically stop and say to this writer "you look like my god mother"  As conversation continued patient states "I don't believe your"  Requires some coaxing to stay on task A:  Support and encouragement offered.  Safety maintained.  Medications given according to orders.

## 2015-09-03 ENCOUNTER — Inpatient Hospital Stay: Payer: No Typology Code available for payment source

## 2015-09-03 LAB — PREGNANCY, URINE: PREG TEST UR: NEGATIVE

## 2015-09-03 MED ORDER — LORAZEPAM 0.5 MG PO TABS
0.5000 mg | ORAL_TABLET | Freq: Every day | ORAL | Status: DC
  Administered 2015-09-03 – 2015-09-07 (×5): 0.5 mg via ORAL
  Filled 2015-09-03 (×6): qty 1

## 2015-09-03 MED ORDER — WHITE PETROLATUM GEL
Status: AC
  Administered 2015-09-03: 11:00:00
  Filled 2015-09-03: qty 10

## 2015-09-03 MED ORDER — LORAZEPAM 2 MG PO TABS: 2.0000 mg | ORAL_TABLET | Freq: Every day | ORAL | Status: DC

## 2015-09-03 NOTE — BHH Group Notes (Signed)
[facility] Group Notes:  (Nursing/MHT/Case Management/Adjunct)  Date:  09/03/2015  Time:  9:56 PM  Type of Therapy:  Wrap-up Group  Participation Level:  Did Not Attend  Participation Quality:  N/A  Affect:  N/A  Cognitive:  N/A  Insight:  None  Engagement in Group:  N/A  Modes of Intervention:  Discussion  Summary of Progress/Problems:  Levonne Spiller 09/03/2015, 9:56 PM

## 2015-09-03 NOTE — BHH Group Notes (Signed)
[facility] LCSW Group Therapy  09/03/2015 2:32 PM  Type of Therapy:  Group Therapy  Participation Level:  Active  Participation Quality:  Appropriate and Attentive  Affect:  Appropriate  Cognitive:  Alert, Appropriate and Oriented  Insight:  Improving  Engagement in Therapy:  Improving  Modes of Intervention:  Socialization and Support  Summary of Progress/Problems: Patient attended group and participated appropriately and shared in an introductory exercise introducing herself and sharing 2 Lies and 1 Truth "I am 35 years old, I have 3 dogs, and I am in school". The group was able to identify that patient's truth was she is in school and patient elaborated that she is in grad school. Patient was attentive and participated during group on "Emotion Regulation" but left early with 15 minutes remaining in group and did not return.    Keene Breath, MSW, LCSWA 09/03/2015, 2:32 PM
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
## 2015-09-03 NOTE — Progress Notes (Signed)
D:  Per pt self inventory pt reports sleeping good, appetite good, energy level low, ability to pay attention good, rates depression at a 0 out of 10, hopelessness at a 0 out of 10, anxiety at a 0 out of 10, denies SI/HI/AVH, goal today: "go to groups", anxious/irritable during interaction.    A:  Emotional support provided, Encouraged pt to continue with treatment plan and attend all group activities, q15 min checks maintained for safety.  R:  Pt is receptive, going to groups, cooperative with staff and other patients on the unit.

## 2015-09-03 NOTE — Progress Notes (Signed)
Recreation Therapy Notes  Date: 02.22.17 Time: 3:00 pm Location: Community Room   Group Topic: Self-esteem, Coping skills  Goal Area(s) Addresses:  Patient will identify positive traits about self. Patient will identify at least one coping skill.  Behavioral Response: Arrived late, Attentive  Intervention: All About Me  Activity: Patients were instructed to make an All About Me pamphlet including their life's motto, positive traits about themselves, healthy coping skills, and their support system.  Education: LRT educated patients on ways they can increase their self-esteem.  Education Outcome: In group clarification offered  Clinical Observations/Feedback: Patient arrived to group at approximately 3:30 pm. Patient left group shortly after and returned at approximately 3:40 pm. Patient contributed to group discussion.  Leonette Monarch, LRT/CTRS 09/03/2015 4:28 PM

## 2015-09-03 NOTE — Plan of Care (Signed)
Problem: [facility] Participation in Recreation Therapeutic Interventions Goal: STG-Patient will identify at least five coping skills for ** STG: Coping Skills - Within 4 treatment sessions, patient will verbalize at least 5 coping skills for anger in each of 2 treatment sessions to increase anger management post d/c.  Outcome: Progressing Treatment Session 1; Completed 1 out of 2: At approximately 12:50 pm, LRT met with patient in patient room. Patient verbalized 5 coping skills for anger. Patient verbalized what triggers her to get angry, how her body responds to anger, and how she is going to remember to use her healthy coping skills. LRT provided suggestions as well. Intervention Used: Coping Skills worksheet  Leonette Monarch, LRT/CTRS 02.22.17 1:28 pm Goal: STG-Other Recreation Therapy Goal (Specify) STG: Stress Management - Within 4 treatment sessions, patient will verbalize understanding of the stress management techniques in each of 2 treatment sessions to increase stress management skills post d/c.  Outcome: Progressing Treatment Session 1; Completed 1 out of 2: At approximately 12:50 pm, LRT met with patient in patient room. LRT educated and provided patient with handouts on stress management techniques. Patient verbalize understanding. LRT encouraged patient to read over and practice the stress management techniques. Intervention Used: Stress Management handouts  Leonette Monarch, LRT/CTRS 02.22.17 1:30 pm

## 2015-09-03 NOTE — BHH Group Notes (Signed)
[place] Group Notes:  (Nursing/MHT/Case Management/Adjunct)  Date:  09/03/2015  Time:  2:19 PM  Type of Therapy:  Psychoeducational Skills  Participation Level:  Active  Participation Quality:  Appropriate  Affect:  Appropriate  Cognitive:  Appropriate  Insight:  Appropriate  Engagement in Group:  Engaged  Modes of Intervention:  Discussion, Education and Support  Summary of Progress/Problems:  Lorane Gell 09/03/2015, 2:19 PM

## 2015-09-03 NOTE — Progress Notes (Signed)
D: Pt appears anxious this evening. Affect is inconsistent with thought content. Pt requires frequent nursing intervention and redirection. Pt states "I want to sign myself out. I am voluntary." Pt c/o back pain this evening and requests PRN medication. Pt seen in hallway after administration hunched over, as though she was in pain. Writer redirected pt into her room to sit down. Pt would not elaborate on situation. Denies SI/HI/AVH at this time.  A: Writer educated pt on the process of signing a 72 hour request for discharge. Emotional support and encouragement provided. Medications administered with education. Pt educated on fall prevention. q15 minute safety checks maintained. R: Pt did not appear to understand teaching, and instead changed the subject. Pt remains free from harm. Will continue to monitor.

## 2015-09-03 NOTE — BHH Group Notes (Signed)
[facility] LCSW Aftercare Discharge Planning Group Note   09/03/2015 11:15 AM  Participation Quality:   Patient attended group and participated introducing herself and sharing her SMART goal is to "have a positive day". Patient was attentive during group but responses other than introductions and left group early after 30 minutes and did not return.   Mood/Affect:  Blunted  Depression Rating:  0  Anxiety Rating:  0  Thoughts of Suicide:  No Will you contract for safety?   NA  Current AVH:  No  Plan for Discharge/Comments:  Discharge either home with family or back to apartment alone and will need outpatient follow up scheduled at discharge  Transportation Means: family will pick up  Supports: mom, sister  Keene Breath, MSW, LCSWA

## 2015-09-03 NOTE — BHH Group Notes (Signed)
[place] Group Notes:  (Nursing/MHT/Case Management/Adjunct)  Date:  09/03/2015  Time:  12:34 AM  Type of Therapy:  Group Therapy  Participation Level:  Minimal  Participation Quality:  Resistant  Affect:  Resistant and Tearful  Cognitive:  Alert  Insight:  Limited  Engagement in Group:  Limited and Resistant  Modes of Intervention:  Discussion  Summary of Progress/Problems: Pt would respond to staff by whispering and shrugging. PT started crying, when staff asked PT what was wrong she shrugged and her shoulders and refused to respond. PT was directed to nurse when group ended.   Jenetta Downer Yeslin Delio 09/03/2015, 12:34 AM

## 2015-09-03 NOTE — Plan of Care (Signed)
Problem: Ineffective individual coping Goal: STG: Patient will remain free from self harm Outcome: Progressing Pt remains free from harm.  Problem: Alteration in mood; excessive anxiety as evidenced by: Goal: LTG-Patient's behavior demonstrates decreased anxiety (Patient's behavior demonstrates anxiety and he/she is utilizing learned coping skills to deal with anxiety-producing situations)  Outcome: Not Progressing Pt behavior demonstrates increased anxiety this evening. Pt has various complaints throughout the night requiring frequent nursing intervention.

## 2015-09-04 MED ORDER — LORATADINE 10 MG PO TABS
10.0000 mg | ORAL_TABLET | Freq: Every day | ORAL | Status: DC
  Administered 2015-09-04 – 2015-09-08 (×5): 10 mg via ORAL
  Filled 2015-09-04 (×5): qty 1

## 2015-09-04 MED ORDER — MENTHOL 3 MG MT LOZG
1.0000 | LOZENGE | OROMUCOSAL | Status: DC | PRN
  Administered 2015-09-04 – 2015-09-05 (×4): 3 mg via ORAL
  Filled 2015-09-04: qty 9

## 2015-09-04 MED ORDER — LEVOTHYROXINE SODIUM 112 MCG PO TABS
224.0000 ug | ORAL_TABLET | Freq: Every day | ORAL | Status: DC
  Administered 2015-09-05 – 2015-09-08 (×4): 224 ug via ORAL
  Filled 2015-09-04 (×5): qty 2

## 2015-09-04 NOTE — Progress Notes (Signed)
D: Observed pt in dayroom interacting with peers. Patient alert and oriented x4. Patient denies SI/HI/AVH. Pt affect is flat. When pt entered medication room pt was laughing and smiling, when writer asked what was funny pt stated "I just don't know what's going on". Pt continued to smile and laugh to self throughout conversation without an apparent reason. Pt stated she was too tired to go to group earlier. When asked about her mood pt stated " I don't know what I feel, I'm just tired." Pt rated depression 0/10 and anxiety 0/10 and denied paranoia.  A: Offered active listening and support. Provided therapeutic communication. Administered scheduled medications.  R: Pt pleasant and cooperative. Pt medication compliant. Will continue Q15 min. checks. Safety maintained.

## 2015-09-04 NOTE — Plan of Care (Signed)
Problem: Alteration in mood; excessive anxiety as evidenced by: Goal: LTG-Patient's behavior demonstrates decreased anxiety (Patient's behavior demonstrates anxiety and he/she is utilizing learned coping skills to deal with anxiety-producing situations)  Outcome: Progressing Pt appeared less anxious, was less needy, and denied anxiety this evening.

## 2015-09-04 NOTE — Plan of Care (Signed)
Problem: Ineffective individual coping Goal: LTG: Patient will report a decrease in negative feelings Outcome: Progressing Patient denies SI/HI.      

## 2015-09-04 NOTE — Plan of Care (Signed)
Problem: [facility] Participation in Recreation Therapeutic Interventions Goal: STG-Patient will identify at least five coping skills for ** STG: Coping Skills - Within 4 treatment sessions, patient will verbalize at least 5 coping skills for anger in each of 2 treatment sessions to increase anger management post d/c.  Outcome: Completed/Met Date Met:  09/04/15 Treatment Session 2; Completed 2 out of 2: At approximately 12:05 pm, LRT met with patient in patient room. Patient verbalized 5 coping skills for anger. LRT encouraged patient to use her healthy coping skills when she felt herself getting angry to help calm herself down. Intervention Used: Coping Skills worksheet  Leonette Monarch, LRT/CTRS 02.23.17 12:16 pm Goal: STG-Other Recreation Therapy Goal (Specify) STG: Stress Management - Within 4 treatment sessions, patient will verbalize understanding of the stress management techniques in each of 2 treatment sessions to increase stress management skills post d/c.  Outcome: Completed/Met Date Met:  09/04/15 Treatment Session 2; Completed 2 out of 2: At approximately 12:05 pm, LRT met with patient in patient room. Patient reported she read over the stress management techniques. Patient verbalized understanding. LRT encouraged patient to practice the stress management techniques post d/c. Intervention Used: Stress Management handouts  Leonette Monarch, LRT/CTRS 02.23.17 12:18 pm

## 2015-09-04 NOTE — Plan of Care (Signed)
Problem: Alteration in mood; excessive anxiety as evidenced by: Goal: LTG-Patient's behavior demonstrates decreased anxiety (Patient's behavior demonstrates anxiety and he/she is utilizing learned coping skills to deal with anxiety-producing situations)  Outcome: Progressing Patient noted less anxious , freely moving about unit

## 2015-09-04 NOTE — Tx Team (Signed)
Interdisciplinary Treatment Plan Update (Adult)  Date:  09/04/2015 Time Reviewed:  4:10 PM  Progress in Treatment: Attending groups: Yes. Participating in groups:  Yes. Taking medication as prescribed:  Yes. Tolerating medication:  Yes. Family/Significant othe contact made:  Yes, individual(s) contacted:  patient's mother Patient understands diagnosis:  No. It is not clear at this time if patient has an understanding. Discussing patient identified problems/goals with staff:  Yes. Medical problems stabilized or resolved:  Yes. Denies suicidal/homicidal ideation: Yes. Issues/concerns per patient self-inventory:  Yes. Other:  New problem(s) identified: No, Describe:  none reported  Discharge Plan or Barriers: Patient will stabilize on medications and discharge home with family and has outpatient provider  Reason for Continuation of Hospitalization: Depression Medication stabilization  Comments:  Estimated length of stay: expected discharge Monday 09/08/15  New goal(s):  Review of initial/current patient goals per problem list:   1.  Goal(s): Participate in aftercare plan   Met:  No  Target date: by discharge  As evidenced by: patient will participate in aftercare plan AEB aftercare provider and housing plan identified at discharge 09/02/15: patient has a follow up provider but needs to decide if she will discharge home to her apt alone or with her mother 09/04/15: patient is currently thought to have no insurance but called and is awaiting a return call from her employer to verify to determine outpatient provider at discharge  2.  Goal (s): Decrease depression   Met:  No  Target date: by discharge  As evidenced by: patient demonstrates decreased symptoms of depression and reports a Depression rating of 3 or less 09/02/15: patient denies SI and is started on medication but reports low energy levels and "just want to rest" 09/04/15: patient denies SI and is attending group is  less disorganized today.   Attendees: Physician:  Merlyn Albert, MD 2/23/20174:10 PM  Nursing:   Polly Cobia, RN 2/23/20174:10 PM  Other:  Carmell Austria, [place] 2/23/20174:10 PM  Other:   2/23/20174:10 PM  Other:   2/23/20174:10 PM  Other:  2/23/20174:10 PM  Other:  2/23/20174:10 PM  Other:  2/23/20174:10 PM  Other:  2/23/20174:10 PM  Other:  2/23/20174:10 PM  Other:  2/23/20174:10 PM  Other:   2/23/20174:10 PM   Scribe for Treatment Team:   Keene Breath, MSW, [place]  09/04/2015, 4:10 PM [phone]

## 2015-09-04 NOTE — Progress Notes (Signed)
D: Patient stated slept good last night .Stated appetite is fair and energy level  Is normal. Stated concentration is good . Stated on Depression scale 0, hopeless 0 and anxiety 0 .( low 0-10 high) Denies suicidal  homicidal ideations  .  No auditory hallucinations  No pain concerns . Appropriate ADL'S. Interacting with peers and staff. Repeatly came to nursing station , somatic  Complaints.   A: Encourage patient participation with unit programming . Instruction  Given on  Medication , verbalize understanding. R: Voice no other concerns. Staff continue to monitor

## 2015-09-04 NOTE — Progress Notes (Signed)
D: Pt denies SI/HI/AVH, Pt is pleasant and cooperative, affect flat and sad. Pt appears less anxious and he is interacting with peers and staff appropriately.  A: Pt was offered support and encouragement. Pt was given scheduled medications. Pt was encouraged to attend groups. Q 15 minute checks were done for safety.  R:Pt attends groups and interacts well with peers and staff. Pt is taking medication. Pt has no complaints.Pt receptive to treatment and safety maintained on unit.

## 2015-09-04 NOTE — BHH Group Notes (Signed)
[place] LCSW Group Therapy  09/04/2015 3:51 PM  Type of Therapy:  Group Therapy  Participation Level:  Active  Participation Quality:  Appropriate and Attentive  Affect:  Appropriate  Cognitive:  Alert, Appropriate and Oriented  Insight:  Improving  Engagement in Therapy:  Engaged  Modes of Intervention:  Discussion, Socialization and Support  Summary of Progress/Problems: Patient attended and participated in group introducing herself and sharing in an introductory activity that her self-care activity is "traveling.Marland KitchenMarland KitchenI would like to go to [place] to the beaches". Patient was attentive throughout group discussion and shared that her change would be her living situation and would like to find a safe place to live.    Keene Breath, MSW, LCSWA 09/04/2015, 3:51 PM
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
## 2015-09-04 NOTE — Progress Notes (Signed)
Recreation Therapy Notes  Date: 02.23.17 Time: 3:00 pm Location: Community Room  Group Topic: Leisure Education  Goal Area(s) Addresses:  Patient will identify activities for each letter of the alphabet. Patient will verbalize ability to integrate positive leisure into life post d/c. Patient will verbalize ability to use leisure as a Technical sales engineer.  Behavioral Response: Attentive, Interactive  Intervention: Leisure Alphabet  Activity: Patients were given a Leisure Alphabet and instructed to think of a healthy leisure activity for each letter of the alphabet.  Education: LRT educated patients on what they need to participate in leisure.  Education Outcome: In group clarification offered  Clinical Observations/Feedback: Patient completed activity. Patient contributed to group discussion by stating healthy leisure activities she wrote down.  Leonette Monarch, LRT/CTRS 09/04/2015 4:37 PM

## 2015-09-04 NOTE — BHH Group Notes (Signed)
[place] Group Notes:  (Nursing/MHT/Case Management/Adjunct)  Date:  09/04/2015  Time:  2:05 PM  Type of Therapy:  Movement Therapy  Participation Level:  Minimal  Participation Quality:  Attentive  Affect:  Appropriate  Cognitive:  Alert  Insight:  None  Engagement in Group:  Off Topic  Modes of Intervention:  Activity and Discussion  Summary of Progress/Problems:  Colleen Peck Alan Drummer 09/04/2015, 2:05 PM

## 2015-09-05 MED ORDER — ARIPIPRAZOLE 10 MG PO TABS
20.0000 mg | ORAL_TABLET | Freq: Every day | ORAL | Status: DC
  Administered 2015-09-06 – 2015-09-08 (×3): 20 mg via ORAL
  Filled 2015-09-05 (×3): qty 2

## 2015-09-05 MED ORDER — WHITE PETROLATUM GEL
Status: AC
  Administered 2015-09-05: 15:00:00
  Filled 2015-09-05: qty 5

## 2015-09-05 NOTE — BHH Group Notes (Signed)
[facility] LCSW Aftercare Discharge Planning Group Note   09/05/2015 3:30 PM  Participation Quality:   Patient attended group and participated appropriately sharing her SMART goal is to "get out of here, get out of my apartment, continue to destress and accomplish my goals, back where I left off , better support system now, and do the right thing." Patient left group after 25 minutes and did not return.  Mood/Affect:  Appropriate  Depression Rating:  Left early and did not report  Anxiety Rating:  Left early and did not report  Thoughts of Suicide:  No Will you contract for safety?   NA  Current AVH:  No  Plan for Discharge/Comments:  Home with family and outpatient follow up. Wants to return to school and work  [employer]: mom will pick up  Supports: family, school, outpatient provider  Carmell Austria T, MSW, LCSWA
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
## 2015-09-05 NOTE — BHH Group Notes (Signed)
[place] Group Notes:  (Nursing/MHT/Case Management/Adjunct)  Date:  09/05/2015  Time:  11:47 AM  Type of Therapy:  Psychoeducational Skills  Participation Level:  Active  Participation Quality:  Appropriate  Affect:  Appropriate  Cognitive:  Appropriate  Insight:  Appropriate  Engagement in Group:  Engaged  Modes of Intervention:  Socialization  Summary of Progress/Problems:  Drake Leach 09/05/2015, 11:47 AM

## 2015-09-05 NOTE — Plan of Care (Signed)
Problem: Alteration in mood; excessive anxiety as evidenced by: Goal: LTG-Patient's behavior demonstrates decreased anxiety (Patient's behavior demonstrates anxiety and he/she is utilizing learned coping skills to deal with anxiety-producing situations)  Outcome: Progressing Patient remains calm & appropriate.

## 2015-09-05 NOTE — Progress Notes (Signed)
Recreation Therapy Notes  Date: 02.24.17 Time: 3:10 pm Location: Community Room  Group Topic: Coping Skills  Goal Area(s) Addresses:  Patient will participate in a coping skill. Patient will verbalize benefit of using art as a coping skill.  Behavioral Response: Did not attend  Intervention: Coloring  Activity: Patients were given coloring sheets and instructed to color while thinking about the emotions they are feeling and what their mind is focused on.  Education: LRT educated patients on healthy coping skills.  Education Outcome: Patient did not attend group.  Clinical Observations/Feedback: Patient did not attend group.  Leonette Monarch, LRT/CTRS 09/05/2015 4:19 PM

## 2015-09-05 NOTE — BHH Group Notes (Signed)
[facility] Group Notes:  (Nursing/MHT/Case Management/Adjunct)  Date:  09/05/2015  Time:  1:04 AM  Type of Therapy:  Psychoeducational Skills  Participation Level:  Active  Participation Quality:  Appropriate, Attentive and Sharing  Affect:  Appropriate  Cognitive:  Appropriate  Insight:  Good  Engagement in Group:  Improving  Modes of Intervention:  Discussion  Summary of Progress/Problems:  Kathi Ludwig 09/05/2015, 1:04 AM

## 2015-09-05 NOTE — BHH Group Notes (Signed)
[place] LCSW Group Therapy  09/05/2015 2:56 PM  Type of Therapy:  Group Therapy  Participation Level:  Minimal  Participation Quality:  Resistant  Affect:  Anxious  Cognitive:  Alert  Insight:  Improving  Engagement in Therapy:  Developing/Improving  Modes of Intervention:  Discussion, Socialization and Support  Summary of Progress/Problems: Patient attended and minimally participated in group discussion about "Relapse and Recovery". She introduced herself and denied participating in the introductory activity.  Christa See, CSW Intern 09/05/2015, 2:56 PM  Carmell Austria, MSW, LCSWA 09/05/2015, 3:10 PM

## 2015-09-05 NOTE — Progress Notes (Signed)
Patient was pleasant & cooperative.Talked to staff about her goals to achieve.States "I never being depressed,I was overwhelmed."Denies depression & anxiety now.Participated in groups.Compliant with medications.Appropriate with staff & peers.

## 2015-09-06 DIAGNOSIS — F3162 Bipolar disorder, current episode mixed, moderate: Secondary | ICD-10-CM

## 2015-09-06 NOTE — Plan of Care (Signed)
Problem: Ineffective individual coping Goal: STG: Pt will be able to identify effective and ineffective STG: Pt will be able to identify effective and ineffective coping patterns  Outcome: Progressing Encouraged to attend therapy groups to learn and initiate coping skills.

## 2015-09-06 NOTE — Progress Notes (Addendum)
Patient with appropriate affect, cooperative behavior with meals, meds and plan of care. No SI/HI at this time. Verbalizes needs appropriately with staff, pleasant and appropriate with peers. Encouraged to attend therapy groups to learn and initiate coping skills for management of stressors and diagnosis. Safety maintained. Goal on daily audit sheet is self care.

## 2015-09-06 NOTE — Progress Notes (Signed)
[facility] MD Progress Note  09/06/2015 7:33 PM Colleen Peck  MRN:  [id number]  Subjective:  Colleen Peck is very pleasant but terrily disorganized, tangential and needy. No somtic complaints. Accepts medications and tolerates them well. She makes good effort in groups.  Principal Problem: Bipolar 1 disorder, mixed, moderate ([place]) Diagnosis:   Patient Active Problem List   Diagnosis Date Noted  . Bipolar 1 disorder, mixed, moderate ([place]) [F31.62] 09/02/2015  . Anemia [D64.9] 01/30/2015  . Unspecified vitamin D deficiency [E55.9] 05/22/2013  . Hypothyroidism (acquired) [E03.9] 11/29/2011  . Obesity [E66.9] 09/08/2006  . RHINITIS, ALLERGIC [J30.9] 09/08/2006  . Asthma [J45.909] 09/08/2006  . ECZEMA, ATOPIC DERMATITIS [L20.89] 09/08/2006   Total Time spent with patient: 20 minutes  Past Psychiatric History: bipolar disorder  Past Medical History:  Past Medical History  Diagnosis Date  . Asthma   . Seasonal allergies   . Depression   . PTSD (post-traumatic stress disorder)   . Thyroid disease 2009    Graves disease (pt reported resolved); hypothyriodism  . Abnormal pap     pt reports abnl pap many years ago.  Nl since then.  . Palpitations 03/12/2008    Past Surgical History  Procedure Laterality Date  . Dilation and curettage of uterus  March 2006   Family History:  Family History  Problem Relation Age of Onset  . Drug abuse Father   . Depression Maternal Aunt   . Depression Maternal Grandmother   . Anxiety disorder Maternal Grandmother   . COPD Maternal Grandmother   . Suicidality Cousin   . Depression Cousin   . Bipolar disorder Cousin   . Depression Maternal Aunt   . Hypertension Mother   . Diabetes Paternal Grandfather   . COPD Paternal Grandmother   . Heart disease Neg Hx    Family Psychiatric  History: see H&P. Social History:  History  Alcohol Use  . 0.6 oz/week  . 1 Glasses of wine per week    Comment: past use of alcohol in '08-'09     History  Drug  Use  . Yes  . Special: Marijuana    Comment: past use of marijuana in '08-'09. occasional eats brownies w/ marijuana  before thanksgiving    Social History   Social History  . Marital Status: Single    Spouse Name: N/A  . Number of Children: N/A  . Years of Education: N/A   Social History Main Topics  . Smoking status: Never Smoker   . Smokeless tobacco: Never Used  . Alcohol Use: 0.6 oz/week    1 Glasses of wine per week     Comment: past use of alcohol in '08-'09  . Drug Use: Yes    Special: Marijuana     Comment: past use of marijuana in '08-'09. occasional eats brownies w/ marijuana  before thanksgiving  . Sexual Activity: Not Currently    Birth Control/ Protection: Abstinence   Other Topics Concern  . 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   Additional Social History:    History of alcohol / drug use?: No history of alcohol / drug abuse                    Sleep: Fair  Appetite:  Fair  Current Medications: Current Facility-Administered Medications  Medication Dose Route Frequency Provider Last Rate Last Dose  . acetaminophen (TYLENOL) tablet 650 mg  650  mg Oral Q6H PRN Clovis Fredrickson, MD   650 mg at 09/02/15 2134  . albuterol (PROVENTIL HFA;VENTOLIN HFA) 108 (90 Base) MCG/ACT inhaler 2 puff  2 puff Inhalation Q6H PRN Clovis Fredrickson, MD   2 puff at 09/05/15 1743  . alum & mag hydroxide-simeth (MAALOX/MYLANTA) 200-200-20 MG/5ML suspension 30 mL  30 mL Oral Q4H PRN Arionne Iams B Helena Sardo, MD      . ARIPiprazole (ABILIFY) tablet 20 mg  20 mg Oral Daily Hildred Priest, MD   20 mg at 09/06/15 0854  . ferrous sulfate tablet 325 mg  325 mg Oral Q breakfast Clovis Fredrickson, MD   325 mg at 09/06/15 0854  . fluticasone (FLONASE) 50 MCG/ACT nasal spray 2 spray  2 spray Each Nare Daily Clovis Fredrickson, MD   2 spray at 09/06/15 1000  . levothyroxine  (SYNTHROID, LEVOTHROID) tablet 224 mcg  224 mcg Oral QAC breakfast Hildred Priest, MD   224 mcg at 09/06/15 0854  . loratadine (CLARITIN) tablet 10 mg  10 mg Oral Daily Hildred Priest, MD   10 mg at 09/06/15 0854  . LORazepam (ATIVAN) tablet 0.5 mg  0.5 mg Oral QHS Hildred Priest, MD   0.5 mg at 09/05/15 2220  . magnesium hydroxide (MILK OF MAGNESIA) suspension 30 mL  30 mL Oral Daily PRN Clovis Fredrickson, MD   30 mL at 09/06/15 0956  . menthol-cetylpyridinium (CEPACOL) lozenge 3 mg  1 lozenge Oral PRN Hildred Priest, MD   3 mg at 09/05/15 2222  . naproxen (NAPROSYN) tablet 375 mg  375 mg Oral BID WC Aivan Fillingim B Lashawnta Burgert, MD   375 mg at 09/06/15 1719    Lab Results: No results found for this or any previous visit (from the past 32 hour(s)).  Blood Alcohol level:  Lab Results  Component Value Date   ETH <5 08/18/2015   ETH <5 02/25/2015    Physical Findings: AIMS:  , ,  ,  , Dental Status Current problems with teeth and/or dentures?: No Does patient usually wear dentures?: No  CIWA:    COWS:     Musculoskeletal: Strength & Muscle Tone: within normal limits Gait & Station: normal Patient leans: N/A  Psychiatric Specialty Exam: Review of Systems  All other systems reviewed and are negative.   Blood pressure 114/67, pulse 85, temperature 98.2 F (36.8 C), temperature source Oral, resp. rate 18, height 5\' 6"  (1.676 m), weight 111.131 kg (245 lb), last menstrual period 08/17/2015, SpO2 99 %.Body mass index is 39.56 kg/(m^2).  General Appearance: Casual  Eye Contact::  Good  Speech:  Clear and Coherent  Volume:  Increased  Mood:  Dysphoric  Affect:  Labile  Thought Process:  Disorganized  Orientation:  Full (Time, Place, and Person)  Thought Content:  WDL  Suicidal Thoughts:  No  Homicidal Thoughts:  No  Memory:  Immediate;   Fair Recent;   Fair Remote;   Fair  Judgement:  Impaired  Insight:  Lacking  Psychomotor Activity:   Normal  Concentration:  Fair  Recall:  [name]  Language: Fair  Akathisia:  No  Handed:  Right  AIMS (if indicated):     Assets:  Communication Skills Desire for Improvement Financial Resources/Insurance Housing Physical Health Resilience Social Support  ADL's:  Intact  Cognition: WNL  Sleep:  Number of Hours: 7.15   Treatment Plan Summary: Daily contact with patient to assess and evaluate symptoms and progress in treatment and Medication management  Possible diagnoses at this point are bipolar disorder mixed episode versus schizoaffective disorder bipolar type: Collateral information confirms symptoms of psychosis and mania. Continue Abilify but will increase from 15 mg to 20 mg.  For insomnia: Continue Ativan to 0.5 mg by mouth daily at bedtime for insomnia  Metrorrhagia: Transvaginal ultrasound shows fibroids.--Pt is to f/u outpt  Hypothyroidism continue Synthroid 279mcg g daily  Microcytic anemia continue ferrous sulfate 325 mg daily  Asthma continue albuterol when necessary  Precautions every 15 minute checks  Hospitalization and status continue voluntary admission  Diet regular  Disposition: Will return home once stable--likely Monday  Discharge follow-up: Will continue to follow up with our outpatient clinic.  Labs: TSH, hemoglobin A1c, lipid panels, prolactin all have been checked.  Pt's sister: [phone] Gillermina Phy, MD 09/06/2015, 7:33 PM

## 2015-09-06 NOTE — BHH Group Notes (Signed)
[facility] LCSW Group Therapy  09/06/2015 5:03 PM  Type of Therapy:  Group Therapy  Participation Level:  Active  Participation Quality:  Attentive  Affect:  Appropriate  Cognitive:  Alert  Insight:  Improving  Engagement in Therapy:  Improving  Modes of Intervention:  Discussion, Education, Socialization and Support  Summary of Progress/Problems: Pt will identify unhealthy thoughts and how they impact their emotions and behavior. Pt will be encouraged to discuss these thoughts, emotions and behaviors with the group. Pt discussed feeling overwhelmed and having to take a step back from her responsibilities. She states her family and friends are supportive.   [place]  MSW, LCSWA  09/06/2015, 5:03 PM

## 2015-09-07 NOTE — [facility] Group Notes (Signed)
[facility] Group Notes:  (Nursing/MHT/Case Management/Adjunct)  Date:  09/07/2015  Time:  11:55 AM  Type of Therapy:  Psychoeducational Skills  Participation Level:  Active  Participation Quality:  Appropriate, Attentive and Sharing  Affect:  Appropriate  Cognitive:  Alert and Appropriate  Insight:  Appropriate  Engagement in Group:  Engaged  Modes of Intervention:  Discussion, Education and Support  Summary of Progress/Problems:  Adela Lank [facility] 09/07/2015, 11:55 AM

## 2015-09-07 NOTE — Progress Notes (Signed)
[facility] MD Progress Note  09/07/2015 3:19 PM Colleen Peck  MRN:  [id number]  Subjective:  Colleen Peck is still very tangential today and very talkative. She wants to work and sudy and go to [place] all at the same time. She has multiple questions about her condition and medications. She is looking for advice about her life. She has no somatic complaints. She tolerates medications well she stopped taking. I notice that she's been skipping classes today.  Principal Problem: Bipolar 1 disorder, mixed, moderate ([place]) Diagnosis:   Patient Active Problem List   Diagnosis Date Noted  . Bipolar 1 disorder, mixed, moderate ([place]) [F31.62] 09/02/2015  . Anemia [D64.9] 01/30/2015  . Unspecified vitamin D deficiency [E55.9] 05/22/2013  . Hypothyroidism (acquired) [E03.9] 11/29/2011  . Obesity [E66.9] 09/08/2006  . RHINITIS, ALLERGIC [J30.9] 09/08/2006  . Asthma [J45.909] 09/08/2006  . ECZEMA, ATOPIC DERMATITIS [L20.89] 09/08/2006   Total Time spent with patient: 20 minutes  Past Psychiatric History: Bipolar disorder.  Past Medical History:  Past Medical History  Diagnosis Date  . Asthma   . Seasonal allergies   . Depression   . PTSD (post-traumatic stress disorder)   . Thyroid disease 2009    Graves disease (pt reported resolved); hypothyriodism  . Abnormal pap     pt reports abnl pap many years ago.  Nl since then.  . Palpitations 03/12/2008    Past Surgical History  Procedure Laterality Date  . Dilation and curettage of uterus  March 2006   Family History:  Family History  Problem Relation Age of Onset  . Drug abuse Father   . Depression Maternal Aunt   . Depression Maternal Grandmother   . Anxiety disorder Maternal Grandmother   . COPD Maternal Grandmother   . Suicidality Cousin   . Depression Cousin   . Bipolar disorder Cousin   . Depression Maternal Aunt   . Hypertension Mother   . Diabetes Paternal Grandfather   . COPD Paternal Grandmother   . Heart disease Neg Hx     Family Psychiatric  History: See H&P. Social History:  History  Alcohol Use  . 0.6 oz/week  . 1 Glasses of wine per week    Comment: past use of alcohol in '08-'09     History  Drug Use  . Yes  . Special: Marijuana    Comment: past use of marijuana in '08-'09. occasional eats brownies w/ marijuana  before thanksgiving    Social History   Social History  . Marital Status: Single    Spouse Name: N/A  . Number of Children: N/A  . Years of Education: N/A   Social History Main Topics  . Smoking status: Never Smoker   . Smokeless tobacco: Never Used  . Alcohol Use: 0.6 oz/week    1 Glasses of wine per week     Comment: past use of alcohol in '08-'09  . Drug Use: Yes    Special: Marijuana     Comment: past use of marijuana in '08-'09. occasional eats brownies w/ marijuana  before thanksgiving  . Sexual Activity: Not Currently    Birth Control/ Protection: Abstinence   Other Topics Concern  . 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   Additional Social History:    History of alcohol / drug use?: No history of alcohol / drug abuse  Sleep: Fair  Appetite:  Fair  Current Medications: Current Facility-Administered Medications  Medication Dose Route Frequency Provider Last Rate Last Dose  . acetaminophen (TYLENOL) tablet 650 mg  650 mg Oral Q6H PRN Clovis Fredrickson, MD   650 mg at 09/02/15 2134  . albuterol (PROVENTIL HFA;VENTOLIN HFA) 108 (90 Base) MCG/ACT inhaler 2 puff  2 puff Inhalation Q6H PRN Clovis Fredrickson, MD   2 puff at 09/05/15 1743  . alum & mag hydroxide-simeth (MAALOX/MYLANTA) 200-200-20 MG/5ML suspension 30 mL  30 mL Oral Q4H PRN Henessy Rohrer B Raquell Richer, MD      . ARIPiprazole (ABILIFY) tablet 20 mg  20 mg Oral Daily Hildred Priest, MD   20 mg at 09/07/15 0851  . ferrous sulfate tablet 325 mg  325 mg Oral Q breakfast Clovis Fredrickson, MD   325 mg at 09/07/15 0851  . fluticasone (FLONASE) 50 MCG/ACT nasal spray 2 spray  2 spray Each Nare Daily Clovis Fredrickson, MD   2 spray at 09/07/15 0855  . levothyroxine (SYNTHROID, LEVOTHROID) tablet 224 mcg  224 mcg Oral QAC breakfast Hildred Priest, MD   224 mcg at 09/07/15 [phone]  . loratadine (CLARITIN) tablet 10 mg  10 mg Oral Daily Hildred Priest, MD   10 mg at 09/07/15 0851  . LORazepam (ATIVAN) tablet 0.5 mg  0.5 mg Oral QHS Hildred Priest, MD   0.5 mg at 09/06/15 2115  . magnesium hydroxide (MILK OF MAGNESIA) suspension 30 mL  30 mL Oral Daily PRN Clovis Fredrickson, MD   30 mL at 09/06/15 0956  . menthol-cetylpyridinium (CEPACOL) lozenge 3 mg  1 lozenge Oral PRN Hildred Priest, MD   3 mg at 09/05/15 2222  . naproxen (NAPROSYN) tablet 375 mg  375 mg Oral BID WC Derik Fults B Dhiren Azimi, MD   375 mg at 09/07/15 [id number]    Lab Results: No results found for this or any previous visit (from the past 48 hour(s)).  Blood Alcohol level:  Lab Results  Component Value Date   ETH <5 08/18/2015   ETH <5 02/25/2015    Physical Findings: AIMS:  , ,  ,  , Dental Status Current problems with teeth and/or dentures?: No Does patient usually wear dentures?: No  CIWA:    COWS:     Musculoskeletal: Strength & Muscle Tone: within normal limits Gait & Station: normal Patient leans: N/A  Psychiatric Specialty Exam: Review of Systems  All other systems reviewed and are negative.   Blood pressure 92/73, pulse 78, temperature 98.3 F (36.8 C), temperature source Oral, resp. rate 18, height 5\' 6"  (1.676 m), weight 111.131 kg (245 lb), last menstrual period 08/17/2015, SpO2 99 %.Body mass index is 39.56 kg/(m^2).  General Appearance: Casual  Eye Contact::  Good  Speech:  Pressured  Volume:  Increased  Mood:  Euphoric  Affect:  Appropriate  Thought Process:  Goal Directed  Orientation:  Full (Time, Place, and Person)  Thought  Content:  WDL  Suicidal Thoughts:  No  Homicidal Thoughts:  No  Memory:  Immediate;   Fair Recent;   Fair Remote;   Fair  Judgement:  Poor  Insight:  Lacking  Psychomotor Activity:  Normal  Concentration:  Fair  Recall:  [place]  Language: Fair  Akathisia:  No  Handed:  Right  AIMS (if indicated):     Assets:  Communication Skills Desire for Improvement Financial Resources/Insurance Housing Physical Health Resilience Social Support  ADL's:  Intact  Cognition:  WNL  Sleep:  Number of Hours: 7   Treatment Plan Summary: Daily contact with patient to assess and evaluate symptoms and progress in treatment and Medication management   Possible diagnoses at this point are bipolar disorder mixed episode versus schizoaffective disorder bipolar type: Collateral information confirms symptoms of psychosis and mania. Continue Abilify but will increase from 15 mg to 20 mg.  For insomnia: Continue Ativan to 0.5 mg by mouth daily at bedtime for insomnia  Metrorrhagia: Transvaginal ultrasound shows fibroids.--Pt is to f/u outpt  Hypothyroidism continue Synthroid 256mcg g daily  Microcytic anemia continue ferrous sulfate 325 mg daily  Asthma continue albuterol when necessary  Precautions every 15 minute checks  Hospitalization and status continue voluntary admission  Diet regular  Disposition: Will return home once stable--likely Monday  Discharge follow-up: Will continue to follow up with our outpatient clinic.  Labs: TSH, hemoglobin A1c, lipid panels, prolactin all have been checked.  Pt's sister: [phone] Gillermina Phy, MD 09/07/2015, 3:19 PM

## 2015-09-07 NOTE — Progress Notes (Addendum)
Patient with appropriate affect, cooperative behavior with meals, meds and plan of care. No SI/HI at this time. Social with select peer. Safety maintained. MD into see. Completes daily audit with discharge as goal.

## 2015-09-07 NOTE — [facility] Group Notes (Signed)
[facility] Group Notes:  (Nursing/MHT/Case Management/Adjunct)  Date:  09/07/2015  Time:  12:26 AM  Type of Therapy:  Group Therapy  Participation Level:  Active  Participation Quality:  Appropriate  Affect:  Appropriate  Cognitive:  Appropriate  Insight:  Appropriate  Engagement in Group:  Engaged  Modes of Intervention:  Support  Summary of Progress/Problems:  Colleen Peck 09/07/2015, 12:26 AM

## 2015-09-07 NOTE — B[facility] Group Notes (Signed)
[facility] LCSW Group Therapy  09/07/2015 3:56 PM  Type of Therapy:  Group Therapy  Participation Level:  Minimal  Participation Quality:  Attentive  Affect:  Appropriate   Cognitive:  Alert  Insight:  Limited  Engagement in Therapy:  Limited  Modes of Intervention:  Discussion, Education, Socialization and Support  Summary of Progress/Problems: Pts were asked to identify what balance means to them. They were encouraged to identify what throws them off balance and how to regain balance.  Pt attended group and stayed the entire time. She was slightly argumentative in group. She states she feels overwhelmed because she does not know what the next steps are for her. She state she does not know how to continue working full time, going to school full time and still have time for self care.   [place] MSW, [place]  09/07/2015, 3:56 PM

## 2015-09-07 NOTE — Progress Notes (Signed)
D: Pt cooperative and pleasant. Denies AVH/SI. Seen socializing in milieu. Appropriate with staff and peers. A: Encouragement and support provided. Medications given as prescribed. Q15 minute checks maintained for safety.  R: Pt remains safe on unit. Attended group. Voices no additional concerns. Med compliant. Will continue to monitor.

## 2015-09-07 NOTE — Plan of Care (Signed)
Problem: Ineffective individual coping Goal: STG: Patient will remain free from self harm Outcome: Progressing No SI/HI at this time.      

## 2015-09-08 MED ORDER — LORAZEPAM 0.5 MG PO TABS: 0.5000 mg | ORAL_TABLET | Freq: Three times a day (TID) | ORAL | Status: DC | PRN

## 2015-09-08 MED ORDER — ARIPIPRAZOLE 20 MG PO TABS: 20.0000 mg | ORAL_TABLET | Freq: Every day | ORAL | Status: DC

## 2015-09-08 MED ORDER — ARIPIPRAZOLE 30 MG PO TABS: 30.0000 mg | ORAL_TABLET | Freq: Every day | ORAL | Status: DC

## 2015-09-08 NOTE — Progress Notes (Signed)
Recreation Therapy Notes  INPATIENT RECREATION TR PLAN  Patient Details Name: Colleen Peck MRN: [id number] DOB: 08/20/1980 Today's Date: 09/08/2015  Rec Therapy Plan Is patient appropriate for Therapeutic Recreation?: Yes Treatment times per week: At least once a week TR Treatment/Interventions: 1:1 session, Group participation (Comment) (Appropriate participation in daily recreation therapy tx)  Discharge Criteria Pt will be discharged from therapy if:: Treatment goals are met, Discharged Treatment plan/goals/alternatives discussed and agreed upon by:: Patient/family  Discharge Summary Short term goals set: See Care Plan Short term goals met: Complete Progress toward goals comments: One-to-one attended Which groups?: Goal setting, Self-esteem, Leisure education, Coping skills One-to-one attended: Anger management, stress management Reason goals not met: N/A Therapeutic equipment acquired: None Reason patient discharged from therapy: Discharge from hospital Pt/family agrees with progress & goals achieved: Yes Date patient discharged from therapy: 09/08/15   Leonette Monarch, LRT/CTRS 09/08/2015, 4:47 PM

## 2015-09-08 NOTE — BHH Group Notes (Signed)
[facility] LCSW Aftercare Discharge Planning Group Note   09/08/2015 12:28 PM  Participation Quality:  Patient attended group and introduced herself sharing her SMART goal is to "discharge and meet with my therapist today". Patient was attentive throughout group discussion. Patient reports she is a little anxious because she is discharging.  Mood/Affect:  Appropriate  Depression Rating:  0  Anxiety Rating:  2  Thoughts of Suicide:  No Will you contract for safety?   NA  Current AVH:  No  Plan for Discharge/Comments:  Home with mom and follow up outpatient  Transportation Means: family will pick up  Supports: family and has a provider for outpatient services  Farrel Conners, Olevia Perches, MSW, LCSWA

## 2015-09-08 NOTE — Progress Notes (Signed)
Pleasant and cooperative.  Denies SI/HI/AVH. Discharge instructions given, verbalized understanding.  Prescriptions given and personal belongings returned.  Escorted off unit by this Probation officer to meet family to travel home.

## 2015-09-08 NOTE — [facility] Suicide Risk Assessment (Signed)
[facility] Discharge Suicide Risk Assessment   Principal Problem: Bipolar 1 disorder, mixed, moderate ([place]) Discharge Diagnoses:  Patient Active Problem List   Diagnosis Date Noted  . Bipolar 1 disorder, mixed, moderate ([place]) [F31.62] 09/02/2015  . Anemia [D64.9] 01/30/2015  . Unspecified vitamin D deficiency [E55.9] 05/22/2013  . Hypothyroidism (acquired) [E03.9] 11/29/2011  . Obesity [E66.9] 09/08/2006  . RHINITIS, ALLERGIC [J30.9] 09/08/2006  . Asthma [J45.909] 09/08/2006  . ECZEMA, ATOPIC DERMATITIS [L20.89] 09/08/2006    Total Time spent with patient: 30 minutes   Psychiatric Specialty Exam: ROS                                                         Mental Status Per Nursing Assessment::   On Admission:     Demographic Factors:  Living alone  Loss Factors: Financial problems/change in socioeconomic status  Historical Factors: Impulsivity  Risk Reduction Factors:   Employed and Positive social support  Continued Clinical Symptoms:  Previous Psychiatric Diagnoses and Treatments  Cognitive Features That Contribute To Risk:  None    Suicide Risk:  Minimal: No identifiable suicidal ideation.  Patients presenting with no risk factors but with morbid ruminations; may be classified as minimal risk based on the severity of the depressive symptoms  Follow-up Information    Follow up with [facility] on 09/11/2015.   Why:  For follow-up care appt Thursday 09/11/15 at 2:30pm   Contact information:   [address], [place] [address] Ph [phone] Fax [phone]       Follow up with Ronnie Doss, DO.   Specialty:  Family Medicine   Why:  Feb 28   Contact information:   [id number] [place] [address] [phone]        Hildred Priest, MD 09/08/2015, 9:24 AM

## 2015-09-08 NOTE — Progress Notes (Signed)
  [facility] Adult Case Management Discharge Plan :  Will you be returning to the same living situation after discharge:  No. At discharge, do you have transportation home?: Yes,  patient's sister Do you have the ability to pay for your medications: Yes,  patient has [employer]  Release of information consent forms completed and in the chart;  Patient's signature needed at discharge.  Patient to Follow up at: Follow-up Information    Follow up with [facility] on 09/11/2015.   Why:  For follow-up care appt Thursday 09/11/15 at 2:30pm   Contact information:   [address], [place] [address] Ph [phone] Fax [phone]       Follow up with Ronnie Doss, DO.   Specialty:  Family Medicine   Why:   Tuesday Feb 28 8:45 am   Contact information:   [address] [place] [address] [phone]       Next level of care provider has access to [facility] and Suicide Prevention discussed: Yes,  with patient and her mother Ray Giovannoni [phone]  Have you used any form of tobacco in the last 30 days? (Cigarettes, Smokeless Tobacco, Cigars, and/or Pipes): No  Has patient been referred to the Quitline?: N/A patient is not a smoker  Patient has been referred for addiction treatment: N/A  Keene Breath, MSW, LCSWA 09/08/2015, 2:39 PM
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
## 2015-09-08 NOTE — Progress Notes (Signed)
Patient cooperative and pleasant. Polite. Patient denies SI/HI during shift. Medication compliant. Q 15 min checks maintained for safety.

## 2015-09-08 NOTE — Tx Team (Signed)
Interdisciplinary Treatment Plan Update (Adult)  Date:  09/08/2015 Time Reviewed:  2:36 PM  Progress in Treatment: Attending groups: Yes. Participating in groups:  Yes. Taking medication as prescribed:  Yes. Tolerating medication:  Yes. Family/Significant othe contact made:  Yes, individual(s) contacted:  patient's mother Patient understands diagnosis:  No. It is not clear at this time if patient has an understanding. Discussing patient identified problems/goals with staff:  Yes. Medical problems stabilized or resolved:  Yes. Denies suicidal/homicidal ideation: Yes. Issues/concerns per patient self-inventory:  Yes. Other:  New problem(s) identified: No, Describe:  none reported  Discharge Plan or Barriers: Patient will stabilize on medications and discharge home with family and has outpatient provider  Reason for Continuation of Hospitalization: Depression Medication stabilization  Comments:  Estimated length of stay: will discharge today Monday 09/08/15  New goal(s):  Review of initial/current patient goals per problem list:   1.  Goal(s): Participate in aftercare plan   Met:  Yes  Target date: by discharge  As evidenced by: patient will participate in aftercare plan AEB aftercare provider and housing plan identified at discharge 09/02/15: patient has a follow up provider but needs to decide if she will discharge home to her apt alone or with her mother 09/04/15: patient is currently thought to have no insurance but called and is awaiting a return call from her employer to verify to determine outpatient provider at discharge 09/08/15: Patient called to verify she has [employer] and has identified follow up, housing, and transportation. Goal met.  2.  Goal (s): Decrease depression   Met:  Yes  Target date: by discharge  As evidenced by: patient demonstrates decreased symptoms of depression and reports a Depression rating of 3 or less 09/02/15: patient denies SI and is  started on medication but reports low energy levels and "just want to rest" 09/04/15: patient denies SI and is attending group is less disorganized today. 09/08/15: patient denies SI and reporting her depression is 0. Goal met.   Attendees: Physician:  Merlyn Albert, MD 2/27/20172:36 PM  Nursing:   Elige Radon, RN 2/27/20172:36 PM  Other:  Carmell Austria, [place] 2/27/20172:36 PM  Other:   2/27/20172:36 PM  Other:   2/27/20172:36 PM  Other:  2/27/20172:36 PM  Other:  2/27/20172:36 PM  Other:  2/27/20172:36 PM  Other:  2/27/20172:36 PM  Other:  2/27/20172:36 PM  Other:  2/27/20172:36 PM  Other:   2/27/20172:36 PM   Scribe for Treatment Team:   Keene Breath, MSW, [place]  09/08/2015, 2:36 PM [phone]

## 2015-09-08 NOTE — [facility] Group Notes (Signed)
[facility] Group Notes:  (Nursing/MHT/Case Management/Adjunct)  Date:  09/08/2015  Time:  12:28 PM  Type of Therapy:  Psychoeducational Skills  Participation Level:  Did Not Attend    Drake Leach 09/08/2015, 12:28 PM

## 2015-09-09 ENCOUNTER — Telehealth: Payer: Self-pay | Admitting: Psychiatry

## 2015-09-09 ENCOUNTER — Encounter: Payer: Self-pay | Admitting: Family Medicine

## 2015-09-09 ENCOUNTER — Telehealth: Payer: Self-pay | Admitting: Family Medicine

## 2015-09-09 ENCOUNTER — Ambulatory Visit (INDEPENDENT_AMBULATORY_CARE_PROVIDER_SITE_OTHER): Payer: BLUE CROSS/BLUE SHIELD | Admitting: Family Medicine

## 2015-09-09 VITALS — BP 115/76 | HR 81 | Temp 98.5°F | Ht 67.0 in | Wt 245.8 lb

## 2015-09-09 DIAGNOSIS — D649 Anemia, unspecified: Secondary | ICD-10-CM

## 2015-09-09 DIAGNOSIS — R1011 Right upper quadrant pain: Secondary | ICD-10-CM

## 2015-09-09 DIAGNOSIS — F3162 Bipolar disorder, current episode mixed, moderate: Secondary | ICD-10-CM

## 2015-09-09 MED ORDER — FERROUS SULFATE 325 (65 FE) MG PO TABS: 325.0000 mg | ORAL_TABLET | Freq: Every day | ORAL | Status: DC

## 2015-09-09 NOTE — Progress Notes (Signed)
    Subjective: CC: Hospital follow up HPI: Colleen Peck is a 35 y.o. female presenting to clinic today for follow up. Concerns today include:  1. Bipolar disorder type 1 Notes that Seroquel, Celexa discontinued.  Started on Abilify, Ativan.  Could not get Abilify 2/2 lack of insurance.  She is working to get insurance straightened out.  Sees Dr Teofilo Pod for psychiatry now.  Sees her Thursday.  Therapist is [facility].  Mood stable.  No SI/HI.  2. Right side pain She notes that side pain has been ongoing for a few months.  She notes that there was concern that her medication was inducing the pain.  She notes that she has small fibroids in her uterus.  Pain is a pinching/ numb feeling that is intermittent.  Notes that Naproxen relieves pain.  Has had intermenstrual spotting.  She is unsure if associated with fatty foods.  Endorses intermittent diarrhea.  3. Anemia Was previously taking FeSo4.  She notes that she needs a new rx for this.  We reviewed her recent CBC.  Hgb stable.  Intermittent vaginal bleeding.  Otherwise, asymptomatic.  Social History Reviewed: non smoker. FamHx and MedHx reviewed.  Please see EMR.  ROS: Per HPI  Objective: Office vital signs reviewed. BP 115/76 mmHg  Pulse 81  Temp(Src) 98.5 F (36.9 C) (Oral)  Ht 5\' 7"  (1.702 m)  Wt 245 lb 12.8 oz (111.494 kg)  BMI 38.49 kg/m2  LMP 08/17/2015 (Exact Date)  Physical Examination:  General: Awake, alert, obese, No acute distress, slightly anxious appearing. HEENT: Normal, EOMI, wears glasses GI: soft, obese, bowel sounds present x4, no hepatomegaly, no splenomegaly, mild RUQ TTP, no guarding, no rebound MSK: Normal gait and station Psych: mood stable.  Speech normal. Good eye contact.  Some paranoid thoughts.  Asks "this is a teaching hospital right?" "that means that they can record me even if i say no?".  Assessment/ Plan: 35 y.o. female   1. RUQ abdominal pain. Need to r/o gallbladder pathology (patient  is right age and is obese.  Could consider cholecystitis)  vs liver pathology (low suspicion given the absence of LFT abnormalities) vs MSK (pain relieved by Naproxen) - Return precautions reviewed. - US Abdomen Limited RUQ; Future  2. Bipolar 1 disorder, mixed, moderate ([facility]) NO SI/ HI today.  Patient exhibits slightly paranoid thought.  Though seemed satisfied after I reminded her that her personal medical information is not shared without her permission (when discussing video precepting) - Continue current regimen. - reiterated the importance of medication compliance.  Patient to call ins to make sure Abilify gets covered today.  Otherwise, to call psychiatrist for further instruction/ samples. - Follow up in 2-3 week with me for check in  3. Anemia, unspecified anemia type - Stable. - ferrous sulfate 325 (65 FE) MG tablet; Take 1 tablet (325 mg total) by mouth daily with breakfast.  Dispense: 30 tablet; Refill: 5 - Continue fiber supplement   Total time spent with patient 27 mins.  Greater than 50% of encounter spent in coordination of care/counseling.  Janora Norlander, DO PGY-2, [place]

## 2015-09-09 NOTE — Telephone Encounter (Signed)
Patient ultrasound appt is scheduled for Fri 09/12/15 @ 930AM at [facility]. Please arrive at 915AM. Nothing to eat or drink after midnight. She can reschedule at [phone] if this appt does not work/

## 2015-09-09 NOTE — Patient Instructions (Addendum)
See me back in 2-3 weeks for check in.  Call me if there are any issues with scheduling/ medication/ getting the ultrasound.  Abdominal Pain, Adult Many things can cause belly (abdominal) pain. Most times, the belly pain is not dangerous. Many cases of belly pain can be watched and treated at home. HOME CARE   Do not take medicines that help you go poop (laxatives) unless told to by your doctor.  Only take medicine as told by your doctor.  Eat or drink as told by your doctor. Your doctor will tell you if you should be on a special diet. GET HELP IF:  You do not know what is causing your belly pain.  You have belly pain while you are sick to your stomach (nauseous) or have runny poop (diarrhea).  You have pain while you pee or poop.  Your belly pain wakes you up at night.  You have belly pain that gets worse or better when you eat.  You have belly pain that gets worse when you eat fatty foods.  You have a fever. GET HELP RIGHT AWAY IF:   The pain does not go away within 2 hours.  You keep throwing up (vomiting).  The pain changes and is only in the right or left part of the belly.  You have bloody or tarry looking poop. MAKE SURE YOU:   Understand these instructions.  Will watch your condition.  Will get help right away if you are not doing well or get worse.   This information is not intended to replace advice given to you by your health care provider. Make sure you discuss any questions you have with your health care provider.   Document Released: 12/15/2007 Document Revised: 07/19/2014 Document Reviewed: 03/07/2013 Elsevier Interactive Patient Education [employer].

## 2015-09-10 NOTE — Telephone Encounter (Signed)
pt called states she couldn't afford the medication prescribed.  pt was told that I could give her a saving card that she would have to call and activate the card before use if she would come by office to pick up

## 2015-09-10 NOTE — Telephone Encounter (Signed)
pt sister came by and picked up saving card.

## 2015-09-11 ENCOUNTER — Ambulatory Visit: Payer: Self-pay | Admitting: Psychiatry

## 2015-09-12 ENCOUNTER — Ambulatory Visit (HOSPITAL_COMMUNITY)
Admission: RE | Admit: 2015-09-12 | Discharge: 2015-09-12 | Disposition: A | Discharge status: Home / Self Care | Payer: BLUE CROSS/BLUE SHIELD | Source: Ambulatory Visit | Attending: Family Medicine | Admitting: Family Medicine

## 2015-09-12 DIAGNOSIS — R1011 Right upper quadrant pain: Secondary | ICD-10-CM

## 2015-09-15 ENCOUNTER — Other Ambulatory Visit: Payer: Self-pay | Admitting: Family Medicine

## 2015-09-15 DIAGNOSIS — R1011 Right upper quadrant pain: Secondary | ICD-10-CM

## 2015-09-16 ENCOUNTER — Ambulatory Visit (HOSPITAL_COMMUNITY)
Admission: RE | Admit: 2015-09-16 | Discharge: 2015-09-16 | Disposition: A | Discharge status: Home / Self Care | Payer: BLUE CROSS/BLUE SHIELD | Source: Ambulatory Visit | Attending: Family Medicine | Admitting: Family Medicine

## 2015-09-16 DIAGNOSIS — R1011 Right upper quadrant pain: Secondary | ICD-10-CM | POA: Diagnosis not present

## 2015-09-17 ENCOUNTER — Encounter (HOSPITAL_COMMUNITY): Payer: Self-pay | Admitting: Psychiatry

## 2015-09-17 ENCOUNTER — Telehealth: Payer: Self-pay | Admitting: Psychiatry

## 2015-09-17 ENCOUNTER — Other Ambulatory Visit (HOSPITAL_COMMUNITY): Payer: BLUE CROSS/BLUE SHIELD | Attending: Psychiatry | Admitting: Psychiatry

## 2015-09-17 DIAGNOSIS — E039 Hypothyroidism, unspecified: Secondary | ICD-10-CM | POA: Insufficient documentation

## 2015-09-17 DIAGNOSIS — F332 Major depressive disorder, recurrent severe without psychotic features: Secondary | ICD-10-CM | POA: Diagnosis present

## 2015-09-17 DIAGNOSIS — F4310 Post-traumatic stress disorder, unspecified: Secondary | ICD-10-CM | POA: Insufficient documentation

## 2015-09-17 DIAGNOSIS — F250 Schizoaffective disorder, bipolar type: Secondary | ICD-10-CM

## 2015-09-17 DIAGNOSIS — J45909 Unspecified asthma, uncomplicated: Secondary | ICD-10-CM | POA: Insufficient documentation
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
## 2015-09-18 ENCOUNTER — Other Ambulatory Visit (HOSPITAL_COMMUNITY): Payer: BLUE CROSS/BLUE SHIELD | Admitting: Psychiatry

## 2015-09-18 DIAGNOSIS — F332 Major depressive disorder, recurrent severe without psychotic features: Secondary | ICD-10-CM | POA: Diagnosis not present

## 2015-09-18 DIAGNOSIS — F250 Schizoaffective disorder, bipolar type: Secondary | ICD-10-CM

## 2015-09-18 NOTE — Progress Notes (Signed)
    Daily Group Progress Note  Program: IOP  Group Time: 9:00-10:30  Participation Level: Active  Behavioral Response: Appropriate  Type of Therapy:  Group Therapy  Summary of Progress: Pt. Met with psychiatrist and case manager for intake assessment.     Group Time: 10:30-12:00  Participation Level:  Active  Behavioral Response: Appropriate  Type of Therapy: Psycho-education Group  Summary of Progress: Pt. Presented as quiet and smiled appropriately. Pt. Introduced herself to the group and stated that she wanted to work on Nurse, adult.  Nancie Neas, LPC

## 2015-09-19 ENCOUNTER — Other Ambulatory Visit (HOSPITAL_COMMUNITY): Payer: BLUE CROSS/BLUE SHIELD | Admitting: Psychiatry

## 2015-09-19 DIAGNOSIS — F250 Schizoaffective disorder, bipolar type: Secondary | ICD-10-CM

## 2015-09-19 DIAGNOSIS — F332 Major depressive disorder, recurrent severe without psychotic features: Secondary | ICD-10-CM | POA: Diagnosis not present
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
## 2015-09-22 ENCOUNTER — Other Ambulatory Visit (HOSPITAL_COMMUNITY): Payer: BLUE CROSS/BLUE SHIELD | Admitting: Psychiatry

## 2015-09-22 DIAGNOSIS — F250 Schizoaffective disorder, bipolar type: Secondary | ICD-10-CM

## 2015-09-22 DIAGNOSIS — F332 Major depressive disorder, recurrent severe without psychotic features: Secondary | ICD-10-CM | POA: Diagnosis not present

## 2015-09-22 NOTE — Progress Notes (Signed)
    Daily Group Progress Note  Program: IOP  Group Time: 9:00-10:30  Participation Level: Active  Behavioral Response: Appropriate  Type of Therapy:  Group Therapy  Summary of Progress: Pt. Presents as reserved, but talkative. Pt. Discussed feeling anxiety and learning that she has coped with her anxiety by "pushing through" and staying busy. Pt. Discussed the discomfort of not doing anything, feels anxious when she is not busy, not happy with her living environment despite the fact that she is not comfortable in her apartment and her mother is loving and supportive of her.      Group Time: 10:30-12:00  Participation Level:  Active  Behavioral Response: Appropriate  Type of Therapy: Psycho-education Group  Summary of Progress: Pt. Participated in grief and loss with Jeanella Craze.  Nancie Neas, LPC

## 2015-09-22 NOTE — Progress Notes (Signed)
    Daily Group Progress Note  Program: IOP  Group Time: 9:00-10:30  Participation Level: Active  Behavioral Response: Appropriate  Type of Therapy:  Psycho-education Group  Summary of Progress: Pt. Participated in medication management education group with [place].     Group Time: 10:30-12:00  Participation Level:  Active  Behavioral Response: Appropriate  Type of Therapy: Psycho-education Group  Summary of Progress: Pt. Presented with brightened mood and less anxious than prior group. Pt. Reported that she was in pain due to injury to her calf muscle. Pt. Reported that she did not want to take pain medication because she does not like the way it makes her feel. Pt. Participated in discussion about stigma and shame associated with mental health diagnosis.  Nancie Neas, LPC

## 2015-09-23 ENCOUNTER — Other Ambulatory Visit (HOSPITAL_COMMUNITY): Payer: BLUE CROSS/BLUE SHIELD | Admitting: Psychiatry

## 2015-09-23 ENCOUNTER — Telehealth: Payer: Self-pay

## 2015-09-23 DIAGNOSIS — F332 Major depressive disorder, recurrent severe without psychotic features: Secondary | ICD-10-CM | POA: Diagnosis not present

## 2015-09-23 DIAGNOSIS — F250 Schizoaffective disorder, bipolar type: Secondary | ICD-10-CM

## 2015-09-23 NOTE — Telephone Encounter (Signed)
left message that per dr. Einar Grad have doctor contact her here at the office if they want to discuss the medication adjustment with her.

## 2015-09-23 NOTE — Progress Notes (Signed)
    Daily Group Progress Note  Program: IOP  Group Time: 9:00-12:00  Participation Level: Active  Behavioral Response: Appropriate  Type of Therapy:  Group Therapy  Summary of Progress: Pt. Presented as talkative, engaged in the group process. Pt. Met with psychiatrist regarding symptoms of depression. Pt. Developing awareness of personal history of depression. Pt. Shared that it was difficult for her to come to group today because it is difficult for her to talk about her depression.     Nancie Neas, LPC

## 2015-09-23 NOTE — Telephone Encounter (Signed)
pt states she is inpatient and that the doctor at [facility] wanted to speak with her about medication adjustment.  I told patient to have doctor call office but pt was insisted that dr. Einar Grad call her cell and speak with her about her medication adjustment

## 2015-09-24 ENCOUNTER — Other Ambulatory Visit (HOSPITAL_COMMUNITY): Payer: BLUE CROSS/BLUE SHIELD | Admitting: Psychiatry

## 2015-09-24 DIAGNOSIS — F332 Major depressive disorder, recurrent severe without psychotic features: Secondary | ICD-10-CM | POA: Diagnosis not present

## 2015-09-24 DIAGNOSIS — F250 Schizoaffective disorder, bipolar type: Secondary | ICD-10-CM

## 2015-09-24 MED ORDER — FLUOXETINE HCL 20 MG PO CAPS: 20.0000 mg | ORAL_CAPSULE | Freq: Every day | ORAL | Status: DC

## 2015-09-24 NOTE — Progress Notes (Signed)
Patient ID: Colleen Peck, female   DOB: 19-Apr-1981, 35 y.o.   MRN: [id number] Has discontinued aripiprazole because of severe akisthesia.  Will treat as Major depression as the bipolar diagnosis seems to be weakly founded.  In the past she has done best on fluoxetine and trazodone she says so have returned to those meds as depression and lack of sleep as well as anxiety are her major complaints.

## 2015-09-24 NOTE — Progress Notes (Signed)
    Daily Group Progress Note  Program: IOP  Group Time: 9:00-10:30  Participation Level: Active  Behavioral Response: Appropriate  Type of Therapy:  Group Therapy  Summary of Progress: Pt. Presents as anxious and depressed. Pt. Received feedback from the group regarding her tendency to say "is that clear?" Pt. Was asked if she had received feedback from others that made her feel that she was confusing or misunderstood and she stated "all of the time". Pt. Became tearful and was affirmed by the group because of her honesty and the courage to allow herself to be vulnerable in the group.     Group Time: 10:30-12:00  Participation Level:  Active  Behavioral Response: Appropriate  Type of Therapy: Psycho-education Group  Summary of Progress: Pt. Participated in yoga therapy session with Jan Fireman, LPC.  Nancie Neas, LPC

## 2015-09-25 ENCOUNTER — Other Ambulatory Visit (HOSPITAL_COMMUNITY): Payer: BLUE CROSS/BLUE SHIELD

## 2015-09-26 ENCOUNTER — Other Ambulatory Visit (HOSPITAL_COMMUNITY): Payer: BLUE CROSS/BLUE SHIELD | Admitting: Psychiatry

## 2015-09-26 DIAGNOSIS — F332 Major depressive disorder, recurrent severe without psychotic features: Secondary | ICD-10-CM | POA: Diagnosis not present

## 2015-09-26 DIAGNOSIS — F250 Schizoaffective disorder, bipolar type: Secondary | ICD-10-CM

## 2015-09-26 NOTE — Progress Notes (Signed)
Colleen Peck is a 35 yr old single, employed, African American female, who was referred per [facility]; treatment for worsening mood symptoms. Pt denied SI/HI or A/V hallucinations. Pt reported feeling more so overwhelmed than depressed upon admission. Stressors: (1) Runner, broadcasting/film/video). States she can't take another leave from school due to her mental illness, because she has used all the leaves. 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 Has taken some time off from job due to being stressed out. Pt is planning a study trip to [place] which overlaps with her school contracts. 3) Financial Strain 4) Housing: Temporarily had to move back in with mother due to problems with apartment (leaks and pests). Reports it is a difficult transition being back at home due to the number of people residing there. Reports numerous psychiatric hospitalizations. Currently sees Royal [facility]ra, [place] (who is out currently on leave) and Dr. Einar Grad in [place]. Family Hx: Sister, Mother, M-GM (Depression) and Father (hx of drugs). Pt requested to talk to writer this morning. Pt states that she doesn't feel safe in the groups.  Inquired if she just started feeling this way.  Pt states she has never felt safe in the groups.  Asked pt to explain what she meant by not feeling safe.  According to pt, she doesn't feel safe sharing in the groups.  Pt is requesting to see her individual therapist Elmyra Ricks).  Reiterated to pt that Elmyra Ricks is out on leave; but there is another therapist seeing her patient's if she would like to see her.  Pt declined; stating that she needed to see another [place] American therapist; but she doesn't want to start over by telling her whole story.  Expressed to pt that although she's requesting discharge; maybe she needs to continue at least until Dr. Lovena Le returns on 09-30-15.  Pt states she  just would like to get away and go on vacation since she's unable to go to [place].  Discussed other options with pt, since she's unable to go on vacation due to financial constraints (ie. Go to the park, go to [employer], self care activities).  Pt denies SI/HI or A/V hallucinations.  Pt was very tearful at times.  "It's just so unfair.  I just want to get better."  A:  Briefly staffed pt with Dr. Casimiro Needle.  Dr. Casimiro Needle suggested maybe trying pt on Seroquel.  Will discuss with Dr. Lovena Le on 09-30-15.  Discussed safety options with pt at length; if needed over the weekend.  R:  Pt receptive.  States she will return to [place] on 09-29-15.        Carlis Abbott, RITA, M.Ed, CNA

## 2015-09-27 ENCOUNTER — Encounter (HOSPITAL_COMMUNITY): Payer: Self-pay | Admitting: Emergency Medicine

## 2015-09-27 ENCOUNTER — Emergency Department (HOSPITAL_COMMUNITY)
Admission: EM | Admit: 2015-09-27 | Discharge: 2015-09-29 | Disposition: A | Discharge status: Other Acute Inpatient Hospital | Payer: BLUE CROSS/BLUE SHIELD | Attending: Emergency Medicine | Admitting: Emergency Medicine

## 2015-09-27 DIAGNOSIS — F3162 Bipolar disorder, current episode mixed, moderate: Secondary | ICD-10-CM | POA: Diagnosis not present

## 2015-09-27 DIAGNOSIS — F312 Bipolar disorder, current episode manic severe with psychotic features: Secondary | ICD-10-CM | POA: Diagnosis present

## 2015-09-27 DIAGNOSIS — F329 Major depressive disorder, single episode, unspecified: Secondary | ICD-10-CM | POA: Insufficient documentation

## 2015-09-27 DIAGNOSIS — J45909 Unspecified asthma, uncomplicated: Secondary | ICD-10-CM | POA: Diagnosis not present

## 2015-09-27 DIAGNOSIS — Z008 Encounter for other general examination: Secondary | ICD-10-CM | POA: Diagnosis present

## 2015-09-27 DIAGNOSIS — E039 Hypothyroidism, unspecified: Secondary | ICD-10-CM | POA: Insufficient documentation

## 2015-09-27 DIAGNOSIS — F22 Delusional disorders: Secondary | ICD-10-CM | POA: Insufficient documentation

## 2015-09-27 DIAGNOSIS — F4310 Post-traumatic stress disorder, unspecified: Secondary | ICD-10-CM | POA: Insufficient documentation

## 2015-09-27 DIAGNOSIS — Z79899 Other long term (current) drug therapy: Secondary | ICD-10-CM | POA: Diagnosis not present

## 2015-09-27 DIAGNOSIS — Z791 Long term (current) use of non-steroidal anti-inflammatories (NSAID): Secondary | ICD-10-CM | POA: Diagnosis not present

## 2015-09-27 LAB — COMPREHENSIVE METABOLIC PANEL
ALT: 19 U/L (ref 14–54)
AST: 17 U/L (ref 15–41)
Albumin: 4.5 g/dL (ref 3.5–5.0)
Alkaline Phosphatase: 53 U/L (ref 38–126)
Anion gap: 9 (ref 5–15)
BUN: 8 mg/dL (ref 6–20)
CHLORIDE: 103 mmol/L (ref 101–111)
CO2: 27 mmol/L (ref 22–32)
Calcium: 10 mg/dL (ref 8.9–10.3)
Creatinine, Ser: 0.82 mg/dL (ref 0.44–1.00)
Glucose, Bld: 121 mg/dL — ABNORMAL HIGH (ref 65–99)
POTASSIUM: 3.8 mmol/L (ref 3.5–5.1)
SODIUM: 139 mmol/L (ref 135–145)
Total Bilirubin: 0.5 mg/dL (ref 0.3–1.2)
Total Protein: 8.4 g/dL — ABNORMAL HIGH (ref 6.5–8.1)

## 2015-09-27 LAB — RAPID URINE DRUG SCREEN, HOSP PERFORMED
AMPHETAMINES: NOT DETECTED
BARBITURATES: NOT DETECTED
BENZODIAZEPINES: NOT DETECTED
COCAINE: NOT DETECTED
Opiates: NOT DETECTED
Tetrahydrocannabinol: NOT DETECTED

## 2015-09-27 LAB — PREGNANCY, URINE: PREG TEST UR: NEGATIVE

## 2015-09-27 LAB — CBC
HEMATOCRIT: 36.6 % (ref 36.0–46.0)
HEMOGLOBIN: 11.4 g/dL — AB (ref 12.0–15.0)
MCH: 26.5 pg (ref 26.0–34.0)
MCHC: 31.1 g/dL (ref 30.0–36.0)
MCV: 85.1 fL (ref 78.0–100.0)
Platelets: 337 10*3/uL (ref 150–400)
RBC: 4.3 MIL/uL (ref 3.87–5.11)
RDW: 15.1 % (ref 11.5–15.5)
WBC: 8.1 10*3/uL (ref 4.0–10.5)

## 2015-09-27 LAB — ETHANOL

## 2015-09-27 MED ORDER — FERROUS SULFATE 325 (65 FE) MG PO TABS
325.0000 mg | ORAL_TABLET | Freq: Every day | ORAL | Status: DC
  Administered 2015-09-28 – 2015-09-29 (×2): 325 mg via ORAL
  Filled 2015-09-27 (×3): qty 1

## 2015-09-27 MED ORDER — ARIPIPRAZOLE 15 MG PO TABS
30.0000 mg | ORAL_TABLET | Freq: Every day | ORAL | Status: DC
  Administered 2015-09-28: 30 mg via ORAL
  Filled 2015-09-27 (×2): qty 2

## 2015-09-27 MED ORDER — FLUOXETINE HCL 20 MG PO CAPS
20.0000 mg | ORAL_CAPSULE | Freq: Every day | ORAL | Status: DC
  Administered 2015-09-28: 20 mg via ORAL
  Filled 2015-09-27 (×2): qty 1

## 2015-09-27 MED ORDER — DICLOFENAC SODIUM 75 MG PO TBEC
75.0000 mg | DELAYED_RELEASE_TABLET | Freq: Two times a day (BID) | ORAL | Status: DC
  Administered 2015-09-28 – 2015-09-29 (×2): 75 mg via ORAL
  Filled 2015-09-27 (×6): qty 1

## 2015-09-27 MED ORDER — TRAZODONE HCL 100 MG PO TABS
100.0000 mg | ORAL_TABLET | Freq: Every day | ORAL | Status: DC
  Administered 2015-09-27 – 2015-09-28 (×2): 100 mg via ORAL
  Filled 2015-09-27 (×2): qty 1

## 2015-09-27 MED ORDER — LEVOTHYROXINE SODIUM 112 MCG PO TABS
224.0000 ug | ORAL_TABLET | Freq: Every day | ORAL | Status: DC
  Administered 2015-09-28: 224 ug via ORAL
  Filled 2015-09-27 (×3): qty 2

## 2015-09-27 MED ORDER — BECLOMETHASONE DIPROPIONATE 40 MCG/ACT IN AERS: 1.0000 | INHALATION_SPRAY | Freq: Two times a day (BID) | RESPIRATORY_TRACT | Status: DC

## 2015-09-27 MED ORDER — BUDESONIDE 0.25 MG/2ML IN SUSP
0.2500 mg | Freq: Two times a day (BID) | RESPIRATORY_TRACT | Status: DC
  Administered 2015-09-27 – 2015-09-29 (×3): 0.25 mg via RESPIRATORY_TRACT
  Filled 2015-09-27 (×4): qty 2

## 2015-09-27 MED ORDER — LORAZEPAM 0.5 MG PO TABS
0.5000 mg | ORAL_TABLET | Freq: Three times a day (TID) | ORAL | Status: DC | PRN
  Administered 2015-09-27 – 2015-09-29 (×4): 0.5 mg via ORAL
  Filled 2015-09-27 (×5): qty 1

## 2015-09-27 NOTE — BH Assessment (Addendum)
Assessment completed. Consulted with Charmaine Downs, PMH-NP who recommends inpatient treatment at this time. Contacted Letitia Libra, RN, [facility] to request bed who states that [facility] is at capacity.   Rosalin Hawking, LCSW Therapeutic Triage Specialist [place] 09/27/2015 4:43 PM

## 2015-09-27 NOTE — ED Notes (Signed)
Pt from home with her sister and a family friend. Pt states she is having paranoid thoughts. She is reporting ah and paranoia. She believes if she thinks about or talks about her family members/loved ones they will die. Pt is calm and cooperative during assessment. Per pt's friend, pt keeps a journal which she is planning on destroying because she thinks writing about friends will also incur harm on them. Pt denies SI/HI. Pt denies hopelessness and states she has never self harmed. Pt just feels her paranoia is "snowballing" like she reports it has before. She is seeking evaluation by an EDP to see if longer term placement for stabilization is recommended.

## 2015-09-27 NOTE — ED Notes (Signed)
Offered pt a meal tray, pt declined saying that she isn't allowed to eat.

## 2015-09-27 NOTE — ED Provider Notes (Signed)
CSN: [id number]     Arrival date & time 09/27/15  1440 History   First MD Initiated Contact with Patient 09/27/15 1544     Chief Complaint  Patient presents with  . Medical Clearance     (Consider location/radiation/quality/duration/timing/severity/associated sxs/prior Treatment) HPI Comments: Patient presents to the ED with a chief complaint of paranoia.  She states that she fears that if she talks about family members or friends then they will die.  She reportedly keeps a journal which she is afraid will harm her friends and family members if she continues to write things about them.  She denies alcohol or drug use.  Denies SI/HI.  Denies auditory or visual hallucinations.  She also reports some chronic abdominal pain which is from uterine fibroids.  She states that she is scheduled to see an OBGYN on Monday.  The history is provided by the patient. No language interpreter was used.    Past Medical History  Diagnosis Date  . Asthma   . Seasonal allergies   . Depression   . PTSD (post-traumatic stress disorder)   . Thyroid disease 2009    Graves disease (pt reported resolved); hypothyriodism  . Abnormal pap     pt reports abnl pap many years ago.  Nl since then.  . Palpitations 03/12/2008   Past Surgical History  Procedure Laterality Date  . Dilation and curettage of uterus  March 2006   Family History  Problem Relation Age of Onset  . Drug abuse Father   . Depression Maternal Aunt   . Depression Maternal Grandmother   . Anxiety disorder Maternal Grandmother   . COPD Maternal Grandmother   . Suicidality Cousin   . Depression Cousin   . Bipolar disorder Cousin   . Depression Maternal Aunt   . Hypertension Mother   . Depression Mother   . Diabetes Paternal Grandfather   . COPD Paternal Grandmother   . Heart disease Neg Hx    Social History  Substance Use Topics  . Smoking status: Never Smoker   . Smokeless tobacco: Never Used  . Alcohol Use: 0.6 oz/week    1 Glasses  of wine per week     Comment: past use of alcohol in '08-'09   OB History    No data available     Review of Systems  Constitutional: Negative for fever and chills.  Respiratory: Negative for shortness of breath.   Cardiovascular: Negative for chest pain.  Gastrointestinal: Negative for nausea, vomiting, diarrhea and constipation.  Genitourinary: Negative for dysuria.  Psychiatric/Behavioral: Positive for dysphoric mood.  All other systems reviewed and are negative.     Allergies  No known allergies  Home Medications   Prior to Admission medications   Medication Sig Start Date End Date Taking? Authorizing Provider  albuterol (PROVENTIL HFA) 108 (90 Base) MCG/ACT inhaler Inhale 2 puffs into the lungs every 6 (six) hours as needed. For shortness of breath, wheezing. 08/06/15  Yes Ashly M Gottschalk, DO  beclomethasone (QVAR) 40 MCG/ACT inhaler Inhale 1 puff into the lungs 2 (two) times daily. Patient taking differently: Inhale 1 puff into the lungs 2 (two) times daily as needed (sob and wheezing).  03/01/15  Yes Kerrie Buffalo, NP  diclofenac (VOLTAREN) 75 MG EC tablet Take 75 mg by mouth 2 (two) times daily.   Yes Historical Provider, MD  ENSURE PLUS (ENSURE PLUS) LIQD Take 237 mLs by mouth 3 (three) times daily as needed (meal supplement).   Yes Historical Provider, MD  ferrous sulfate 325 (65 FE) MG tablet Take 1 tablet (325 mg total) by mouth daily with breakfast. 09/09/15  Yes Ashly M Gottschalk, DO  FLUoxetine (PROZAC) 20 MG capsule Take 1 capsule (20 mg total) by mouth daily. 09/24/15 09/23/16 Yes Clarene Reamer, MD  HYDROcodone-acetaminophen (NORCO/VICODIN) 5-325 MG tablet Take 1 tablet by mouth every 4 (four) hours as needed for moderate pain or severe pain.   Yes Historical Provider, MD  levothyroxine (SYNTHROID, LEVOTHROID) 112 MCG tablet Take 2 tablets (224 mcg total) by mouth at bedtime. Patient taking differently: Take 224 mcg by mouth daily before breakfast.  08/25/15  Yes  Derrill Center, NP  LORazepam (ATIVAN) 0.5 MG tablet Take 1 tablet (0.5 mg total) by mouth every 8 (eight) hours as needed for anxiety or sleep. 09/08/15  Yes Hildred Priest, MD  MELATONIN PO Take 1 tablet by mouth at bedtime as needed (sleep).   Yes Historical Provider, MD  naproxen (NAPROSYN) 375 MG tablet Take 1 tablet (375 mg total) by mouth 2 (two) times daily. 08/30/15  Yes Janne Napoleon, NP  traZODone (DESYREL) 100 MG tablet Take 100 mg by mouth at bedtime.   Yes Historical Provider, MD  ARIPiprazole (ABILIFY) 30 MG tablet Take 1 tablet (30 mg total) by mouth daily. 09/08/15   Hildred Priest, MD   BP 126/82 mmHg  Pulse 94  Temp(Src) 98.7 F (37.1 C) (Oral)  Resp 18  SpO2 100% Physical Exam  Constitutional: She is oriented to person, place, and time. She appears well-developed and well-nourished.  HENT:  Head: Normocephalic and atraumatic.  Eyes: Conjunctivae and EOM are normal. Pupils are equal, round, and reactive to light.  Neck: Normal range of motion. Neck supple.  Cardiovascular: Normal rate and regular rhythm.  Exam reveals no gallop and no friction rub.   No murmur heard. Pulmonary/Chest: Effort normal and breath sounds normal. No respiratory distress. She has no wheezes. She has no rales. She exhibits no tenderness.  Abdominal: Soft. Bowel sounds are normal. She exhibits no distension and no mass. There is no tenderness. There is no rebound and no guarding.  Musculoskeletal: Normal range of motion. She exhibits no edema or tenderness.  Neurological: She is alert and oriented to person, place, and time.  Skin: Skin is warm and dry.  Psychiatric:  Flat affect, tangential thoughts  Nursing note and vitals reviewed.   ED Course  Procedures (including critical care time)    MDM   Final diagnoses:  Medical clearance for psychiatric admission  Paranoia [facility])   Patient here for paranoid ideas about her family/friends being harmed by her speaking or  writing about them.  TTS consult pending.  No focal abdominal pain on exam.  Has appropriate follow-up.  Medically clear pending normal labs.  Inpatient treatment advised.  Waiting for bed at [facility]    Montine Circle, PA-C 09/27/15 [place], MD 09/28/15 [phone]
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
## 2015-09-28 DIAGNOSIS — Z008 Encounter for other general examination: Secondary | ICD-10-CM

## 2015-09-28 DIAGNOSIS — Z113 Encounter for screening for infections with a predominantly sexual mode of transmission: Secondary | ICD-10-CM | POA: Insufficient documentation

## 2015-09-28 DIAGNOSIS — F3162 Bipolar disorder, current episode mixed, moderate: Secondary | ICD-10-CM

## 2015-09-28 DIAGNOSIS — F22 Delusional disorders: Secondary | ICD-10-CM | POA: Diagnosis not present

## 2015-09-28 MED ORDER — OXCARBAZEPINE 300 MG PO TABS
300.0000 mg | ORAL_TABLET | Freq: Two times a day (BID) | ORAL | Status: DC
  Administered 2015-09-28 – 2015-09-29 (×3): 300 mg via ORAL
  Filled 2015-09-28 (×3): qty 1

## 2015-09-28 MED ORDER — ARIPIPRAZOLE 10 MG PO TABS
10.0000 mg | ORAL_TABLET | Freq: Every day | ORAL | Status: DC
  Administered 2015-09-29: 10 mg via ORAL
  Filled 2015-09-28: qty 1

## 2015-09-28 MED ORDER — ALBUTEROL SULFATE HFA 108 (90 BASE) MCG/ACT IN AERS
2.0000 | INHALATION_SPRAY | Freq: Four times a day (QID) | RESPIRATORY_TRACT | Status: DC | PRN
  Administered 2015-09-29: 2 via RESPIRATORY_TRACT
  Filled 2015-09-28: qty 6.7

## 2015-09-28 NOTE — Consult Note (Signed)
[facility] Psychiatry Consult   Reason for Consult:  Disorganized speech and thought, Euphoria. Referring Physician:  EDP Patient Identification: Colleen Peck MRN:  [id number] Principal Diagnosis: Bipolar 1 disorder, mixed, moderate ([place]) Diagnosis:   Patient Active Problem List   Diagnosis Date Noted  . Bipolar 1 disorder, mixed, moderate ([place]) [F31.62] 09/02/2015    Priority: High  . Anemia [D64.9] 01/30/2015  . Unspecified vitamin D deficiency [E55.9] 05/22/2013  . Hypothyroidism (acquired) [E03.9] 11/29/2011  . Obesity [E66.9] 09/08/2006  . RHINITIS, ALLERGIC [J30.9] 09/08/2006  . Asthma [J45.909] 09/08/2006  . ECZEMA, ATOPIC DERMATITIS [L20.89] 09/08/2006    Total Time spent with patient: 45 minutes  Subjective:   Colleen Peck is a 35 y.o. female patient admitted with Disorganized speech and thought, Euphoria.  HPI:  AA female, 35 years old was evaluated for Paranoia.  Patient came to the ER and informed staff that she is afraid the her family members will die  if she talked about them.  Patient reported that she is keeping journals with information that could harm her family.  Patient is euphoric and has been singing off and on.  She is tangential with flight of idea.  Her concentration and focus is poor and could not engage in full conversation.  She has been accepted for admission and we will be seeking placement at any facility with available bed.  Past Psychiatric History:  Bipolar disorder  Risk to Self: Suicidal Ideation: No Suicidal Intent: No Is patient at risk for suicide?: No Suicidal Plan?: No Access to Means: No Specify Access to Suicidal Means: Denies What has been your use of drugs/alcohol within the last 12 months?: used THC brownies How many times?: 0 Other Self Harm Risks: Denies Triggers for Past Attempts: None known Intentional Self Injurious Behavior: None Risk to Others: Homicidal Ideation: No Thoughts of Harm to Others: No Current  Homicidal Intent: No Current Homicidal Plan: No Access to Homicidal Means: No Identified Victim: Denies History of harm to others?: No Assessment of Violence: None Noted Violent Behavior Description: Denies Does patient have access to weapons?: No Criminal Charges Pending?: No Does patient have a court date: No Prior Inpatient Therapy: Prior Inpatient Therapy: Yes Prior Therapy Dates: 2012, 2017 Prior Therapy Facilty/Provider(s): [facility] [place] Reason for Treatment: Paranoia Prior Outpatient Therapy: Prior Outpatient Therapy: Yes Prior Therapy Dates: 2016- Present Prior Therapy Facilty/Provider(s): [facility] Reason for Treatment: Paranoia Does patient have an ACCT team?: No Does patient have Intensive In-House Services?  : No Does patient have [facility] services? : No Does patient have P4CC services?: No  Past Medical History:  Past Medical History  Diagnosis Date  . Asthma   . Seasonal allergies   . Depression   . PTSD (post-traumatic stress disorder)   . Thyroid disease 2009    Graves disease (pt reported resolved); hypothyriodism  . Abnormal pap     pt reports abnl pap many years ago.  Nl since then.  . Palpitations 03/12/2008    Past Surgical History  Procedure Laterality Date  . Dilation and curettage of uterus  March 2006   Family History:  Family History  Problem Relation Age of Onset  . Drug abuse Father   . Depression Maternal Aunt   . Depression Maternal Grandmother   . Anxiety disorder Maternal Grandmother   . COPD Maternal Grandmother   . Suicidality Cousin   . Depression Cousin   . Bipolar disorder Cousin   . Depression Maternal Aunt   . Hypertension Mother   .  Depression Mother   . Diabetes Paternal Grandfather   . COPD Paternal Grandmother   . Heart disease Neg Hx    Family Psychiatric  History:  Unable to obtain, unknown Social History:  History  Alcohol Use  . 0.6 oz/week  . 1 Glasses of wine per week    Comment: past use of alcohol in '08-'09      History  Drug Use  . Yes  . Special: Marijuana    Comment: past use of marijuana in '08-'09. occasional eats brownies w/ marijuana  before thanksgiving    Social History   Social History  . Marital Status: Single    Spouse Name: N/A  . Number of Children: N/A  . Years of Education: N/A   Social History Main Topics  . Smoking status: Never Smoker   . Smokeless tobacco: Never Used  . Alcohol Use: 0.6 oz/week    1 Glasses of wine per week     Comment: past use of alcohol in '08-'09  . Drug Use: Yes    Special: Marijuana     Comment: past use of marijuana in '08-'09. occasional eats brownies w/ marijuana  before thanksgiving  . Sexual Activity: Not Currently    Birth Control/ Protection: Abstinence   Other Topics Concern  . 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   Additional Social History:    Allergies:   Allergies  Allergen Reactions  . No Known Allergies Other (See Comments)    Pt & family state they do not know if pt has any allergies     Labs:  Results for orders placed or performed during the hospital encounter of 09/27/15 (from the past 48 hour(s))  Comprehensive metabolic panel     Status: Abnormal   Collection Time: 09/27/15  3:38 PM  Result Value Ref Range   Sodium 139 135 - 145 mmol/L   Potassium 3.8 3.5 - 5.1 mmol/L   Chloride 103 101 - 111 mmol/L   CO2 27 22 - 32 mmol/L   Glucose, Bld 121 (H) 65 - 99 mg/dL   BUN 8 6 - 20 mg/dL   Creatinine, Ser 0.82 0.44 - 1.00 mg/dL   Calcium 10.0 8.9 - 10.3 mg/dL   Total Protein 8.4 (H) 6.5 - 8.1 g/dL   Albumin 4.5 3.5 - 5.0 g/dL   AST 17 15 - 41 U/L   ALT 19 14 - 54 U/L   Alkaline Phosphatase 53 38 - 126 U/L   Total Bilirubin 0.5 0.3 - 1.2 mg/dL   GFR calc non Af Amer >60 >60 mL/min   GFR calc Af Amer >60 >60 mL/min    Comment: (NOTE) The eGFR has been calculated using the CKD EPI equation. This calculation has not been  validated in all clinical situations. eGFR's persistently <60 mL/min signify possible Chronic Kidney Disease.    Anion gap 9 5 - 15  Ethanol (ETOH)     Status: None   Collection Time: 09/27/15  3:38 PM  Result Value Ref Range   Alcohol, Ethyl (B) <5 <5 mg/dL    Comment:        LOWEST DETECTABLE LIMIT FOR SERUM ALCOHOL IS 5 mg/dL FOR MEDICAL PURPOSES ONLY   CBC     Status: Abnormal   Collection Time: 09/27/15  3:38 PM  Result Value Ref Range   WBC 8.1 4.0 - 10.5 K/uL   RBC 4.30  3.87 - 5.11 MIL/uL   Hemoglobin 11.4 (L) 12.0 - 15.0 g/dL   HCT 36.6 36.0 - 46.0 %   MCV 85.1 78.0 - 100.0 fL   MCH 26.5 26.0 - 34.0 pg   MCHC 31.1 30.0 - 36.0 g/dL   RDW 15.1 11.5 - 15.5 %   Platelets 337 150 - 400 K/uL  Urine rapid drug screen (hosp performed) (Not at [facility])     Status: None   Collection Time: 09/27/15  4:04 PM  Result Value Ref Range   Opiates NONE DETECTED NONE DETECTED   Cocaine NONE DETECTED NONE DETECTED   Benzodiazepines NONE DETECTED NONE DETECTED   Amphetamines NONE DETECTED NONE DETECTED   Tetrahydrocannabinol NONE DETECTED NONE DETECTED   Barbiturates NONE DETECTED NONE DETECTED    Comment:        DRUG SCREEN FOR MEDICAL PURPOSES ONLY.  IF CONFIRMATION IS NEEDED FOR ANY PURPOSE, NOTIFY LAB WITHIN 5 DAYS.        LOWEST DETECTABLE LIMITS FOR URINE DRUG SCREEN Drug Class       Cutoff (ng/mL) Amphetamine      1000 Barbiturate      200 Benzodiazepine   810 Tricyclics       175 Opiates          300 Cocaine          300 THC              50   Pregnancy, urine     Status: None   Collection Time: 09/27/15  4:04 PM  Result Value Ref Range   Preg Test, Ur NEGATIVE NEGATIVE    Comment:        THE SENSITIVITY OF THIS METHODOLOGY IS >20 mIU/mL.     Current Facility-Administered Medications  Medication Dose Route Frequency Provider Last Rate Last Dose  . albuterol (PROVENTIL HFA;VENTOLIN HFA) 108 (90 Base) MCG/ACT inhaler 2 puff  2 puff Inhalation Q6H PRN Corena Pilgrim, MD      . Derrill Memo ON 09/29/2015] ARIPiprazole (ABILIFY) tablet 10 mg  10 mg Oral Daily Opaline Reyburn, MD      . budesonide (PULMICORT) nebulizer solution 0.25 mg  0.25 mg Nebulization BID Adrian Saran, RPH   0.25 mg at 09/27/15 1915  . diclofenac (VOLTAREN) EC tablet 75 mg  75 mg Oral BID Montine Circle, PA-C   75 mg at 09/27/15 2353  . ferrous sulfate tablet 325 mg  325 mg Oral Q breakfast Montine Circle, PA-C   325 mg at 09/28/15 1025  . levothyroxine (SYNTHROID, LEVOTHROID) tablet 224 mcg  224 mcg Oral QHS Montine Circle, PA-C   224 mcg at 09/27/15 2244  . LORazepam (ATIVAN) tablet 0.5 mg  0.5 mg Oral Q8H PRN Montine Circle, PA-C   0.5 mg at 09/28/15 0907  . Oxcarbazepine (TRILEPTAL) tablet 300 mg  300 mg Oral BID Corena Pilgrim, MD   300 mg at 09/28/15 1330  . traZODone (DESYREL) tablet 100 mg  100 mg Oral QHS Montine Circle, PA-C   100 mg at 09/27/15 2351   Current Outpatient Prescriptions  Medication Sig Dispense Refill  . albuterol (PROVENTIL HFA) 108 (90 Base) MCG/ACT inhaler Inhale 2 puffs into the lungs every 6 (six) hours as needed. For shortness of breath, wheezing. 1 Inhaler 1  . beclomethasone (QVAR) 40 MCG/ACT inhaler Inhale 1 puff into the lungs 2 (two) times daily. (Patient taking differently: Inhale 1 puff into the lungs 2 (two) times daily as needed (sob and wheezing). )  1 Inhaler 12  . diclofenac (VOLTAREN) 75 MG EC tablet Take 75 mg by mouth 2 (two) times daily.    Marland Kitchen ENSURE PLUS (ENSURE PLUS) LIQD Take 237 mLs by mouth 3 (three) times daily as needed (meal supplement).    . ferrous sulfate 325 (65 FE) MG tablet Take 1 tablet (325 mg total) by mouth daily with breakfast. 30 tablet 5  . FLUoxetine (PROZAC) 20 MG capsule Take 1 capsule (20 mg total) by mouth daily. 30 capsule 2  . HYDROcodone-acetaminophen (NORCO/VICODIN) 5-325 MG tablet Take 1 tablet by mouth every 4 (four) hours as needed for moderate pain or severe pain.    Marland Kitchen levothyroxine (SYNTHROID,  LEVOTHROID) 112 MCG tablet Take 2 tablets (224 mcg total) by mouth at bedtime. (Patient taking differently: Take 224 mcg by mouth daily before breakfast. ) 30 tablet 0  . LORazepam (ATIVAN) 0.5 MG tablet Take 1 tablet (0.5 mg total) by mouth every 8 (eight) hours as needed for anxiety or sleep. 30 tablet 0  . MELATONIN PO Take 1 tablet by mouth at bedtime as needed (sleep).    . naproxen (NAPROSYN) 375 MG tablet Take 1 tablet (375 mg total) by mouth 2 (two) times daily. 20 tablet 0  . traZODone (DESYREL) 100 MG tablet Take 100 mg by mouth at bedtime.    . ARIPiprazole (ABILIFY) 30 MG tablet Take 1 tablet (30 mg total) by mouth daily. 30 tablet 0  . [DISCONTINUED] benztropine (COGENTIN) 1 MG tablet Take 0.5 mg by mouth daily.      Musculoskeletal: Strength & Muscle Tone: within normal limits Gait & Station: normal Patient leans: N/A  Psychiatric Specialty Exam: Review of Systems  Unable to perform ROS: mental acuity    Blood pressure 130/90, pulse 80, temperature 98.1 F (36.7 C), temperature source Oral, resp. rate 18, SpO2 100 %.There is no weight on file to calculate BMI.  General Appearance: Casual  Eye Contact::  Good  Speech:  Clear and Coherent and Pressured  Volume:  Normal  Mood:  Anxious and Euphoric  Affect:  Congruent and Full Range  Thought Process:  Disorganized and Loose  Orientation:  Other:  unable to obtain  Thought Content:  unable to obtain, patient answered most questions with"I don't know"  Suicidal Thoughts:  unable to obtain  Homicidal Thoughts:  unable to obtain  Memory:  Immediate;   Poor Recent;   Poor Remote;   Poor  Judgement:  Poor  Insight:  Shallow  Psychomotor Activity:  Increased  Concentration:  Poor  Recall:  Poor  Fund of Knowledge:Poor  Language: Fair  Akathisia:  No  Handed:  Right  AIMS (if indicated):     Assets:  Desire for Improvement  ADL's:  Intact  Cognition: WNL  Sleep:      Treatment Plan Summary: Daily contact with  patient to assess and evaluate symptoms and progress in treatment and Medication management  Disposition:  Accepted for admission and we will be seeking placement at any facility with available bed.  We have resumed her home medications and will use Ativan 0.5 mg po every 8 hrs as needed for agitation.  Delfin Gant, NP    PMHNP-BC 09/28/2015 4:04 PM Patient seen face-to-face for psychiatric evaluation, chart reviewed and case discussed with the physician extender and developed treatment plan. Reviewed the information documented and agree with the treatment plan. Corena Pilgrim, MD

## 2015-09-28 NOTE — ED Notes (Signed)
Provided 2 warm blankets.  

## 2015-09-28 NOTE — ED Notes (Signed)
Pt has called out multiple times for staff, asking repetitive questions, questions are answered then patient begins to sob. Pt states she cannot sleep and needs to talk to the doctor. Explained to pt that the doctors will be by in the morning to talk to her.

## 2015-09-28 NOTE — BH Assessment (Signed)
Writer referred patient to the following hospitals [facility], [facility], [place], [place], [place], [place], [place], [place], [place]

## 2015-09-28 NOTE — ED Notes (Signed)
Informed patient her anxiety medication (Ativan) is not due til 17:07 and is given every 8 hours. Pt sat on her bed. After leaving the room, she shut her door with the curtain open.

## 2015-09-29 ENCOUNTER — Inpatient Hospital Stay (HOSPITAL_COMMUNITY)
Admission: AD | Admit: 2015-09-29 | Discharge: 2015-10-06 | DRG: 885 | Disposition: A | Discharge status: Home / Self Care | Payer: BLUE CROSS/BLUE SHIELD | Source: Intra-hospital | Attending: Psychiatry | Admitting: Psychiatry

## 2015-09-29 ENCOUNTER — Other Ambulatory Visit (HOSPITAL_COMMUNITY): Payer: BLUE CROSS/BLUE SHIELD | Admitting: Psychiatry

## 2015-09-29 ENCOUNTER — Encounter (HOSPITAL_COMMUNITY): Payer: Self-pay

## 2015-09-29 DIAGNOSIS — F3164 Bipolar disorder, current episode mixed, severe, with psychotic features: Principal | ICD-10-CM | POA: Diagnosis present

## 2015-09-29 DIAGNOSIS — F4310 Post-traumatic stress disorder, unspecified: Secondary | ICD-10-CM | POA: Diagnosis present

## 2015-09-29 DIAGNOSIS — F319 Bipolar disorder, unspecified: Secondary | ICD-10-CM | POA: Diagnosis present

## 2015-09-29 DIAGNOSIS — F312 Bipolar disorder, current episode manic severe with psychotic features: Secondary | ICD-10-CM | POA: Diagnosis present

## 2015-09-29 DIAGNOSIS — F4312 Post-traumatic stress disorder, chronic: Secondary | ICD-10-CM | POA: Diagnosis present

## 2015-09-29 DIAGNOSIS — Z008 Encounter for other general examination: Secondary | ICD-10-CM | POA: Diagnosis not present

## 2015-09-29 MED ORDER — BUDESONIDE 0.25 MG/2ML IN SUSP
0.2500 mg | Freq: Two times a day (BID) | RESPIRATORY_TRACT | Status: DC
  Administered 2015-09-30 – 2015-10-06 (×13): 0.25 mg via RESPIRATORY_TRACT
  Filled 2015-09-29 (×18): qty 2

## 2015-09-29 MED ORDER — DICLOFENAC SODIUM 25 MG PO TBEC
75.0000 mg | DELAYED_RELEASE_TABLET | Freq: Once | ORAL | Status: AC
  Administered 2015-09-29: 75 mg via ORAL
  Filled 2015-09-29 (×2): qty 3

## 2015-09-29 MED ORDER — ALUM & MAG HYDROXIDE-SIMETH 200-200-20 MG/5ML PO SUSP: 30.0000 mL | ORAL | Status: DC | PRN

## 2015-09-29 MED ORDER — ALBUTEROL SULFATE HFA 108 (90 BASE) MCG/ACT IN AERS: 2.0000 | INHALATION_SPRAY | Freq: Four times a day (QID) | RESPIRATORY_TRACT | Status: DC | PRN

## 2015-09-29 MED ORDER — ACETAMINOPHEN 325 MG PO TABS
650.0000 mg | ORAL_TABLET | Freq: Four times a day (QID) | ORAL | Status: DC | PRN
  Administered 2015-10-02 – 2015-10-05 (×4): 650 mg via ORAL
  Filled 2015-09-29 (×4): qty 2

## 2015-09-29 MED ORDER — OXCARBAZEPINE 300 MG PO TABS
300.0000 mg | ORAL_TABLET | Freq: Two times a day (BID) | ORAL | Status: DC
  Administered 2015-09-29 – 2015-10-06 (×14): 300 mg via ORAL
  Filled 2015-09-29 (×20): qty 1

## 2015-09-29 MED ORDER — LORAZEPAM 0.5 MG PO TABS
0.5000 mg | ORAL_TABLET | Freq: Three times a day (TID) | ORAL | Status: DC | PRN
  Administered 2015-09-30 (×2): 0.5 mg via ORAL
  Filled 2015-09-29 (×2): qty 1

## 2015-09-29 MED ORDER — DICLOFENAC SODIUM 75 MG PO TBEC
75.0000 mg | DELAYED_RELEASE_TABLET | Freq: Two times a day (BID) | ORAL | Status: DC
  Administered 2015-10-01: 75 mg via ORAL
  Filled 2015-09-29 (×8): qty 1

## 2015-09-29 MED ORDER — LEVOTHYROXINE SODIUM 112 MCG PO TABS
224.0000 ug | ORAL_TABLET | Freq: Every day | ORAL | Status: DC
Start: 2015-09-29 — End: 2015-09-29
  Filled 2015-09-29 (×2): qty 2

## 2015-09-29 MED ORDER — ARIPIPRAZOLE 10 MG PO TABS
10.0000 mg | ORAL_TABLET | Freq: Every day | ORAL | Status: DC
  Filled 2015-09-29 (×3): qty 1

## 2015-09-29 MED ORDER — FERROUS SULFATE 325 (65 FE) MG PO TABS
325.0000 mg | ORAL_TABLET | Freq: Every day | ORAL | Status: DC
  Administered 2015-09-30 – 2015-10-06 (×7): 325 mg via ORAL
  Filled 2015-09-29 (×10): qty 1

## 2015-09-29 MED ORDER — TRAZODONE HCL 100 MG PO TABS
100.0000 mg | ORAL_TABLET | Freq: Every day | ORAL | Status: DC
  Administered 2015-09-30 – 2015-10-05 (×7): 100 mg via ORAL
  Filled 2015-09-29 (×11): qty 1

## 2015-09-29 MED ORDER — MAGNESIUM HYDROXIDE 400 MG/5ML PO SUSP
30.0000 mL | Freq: Every day | ORAL | Status: DC | PRN
  Administered 2015-10-04 – 2015-10-06 (×2): 30 mL via ORAL
  Filled 2015-09-29 (×2): qty 30

## 2015-09-29 NOTE — Progress Notes (Signed)
    Daily Group Progress Note  Program: IOP  Group Time: 9:00-10:30  Participation Level: Minimal  Behavioral Response: Rigid  Type of Therapy:  Group Therapy  Summary of Progress: Pt. Appeared withdrawn and resistant to the group process. Pt. Shared with the group "I don't want to be here." , "I feel pressured to do things". Pt. Was evasive regarding her reasons for not wanting to be in group and was not able to clarify what was causing her to feel pressured. Pt. Stated "I don't feel safe" but was not able to clarify what she was feeling in the room that caused her to not feel safe. Pt. Discussed that today was the day that she was to make deposit for her trip to [place]. Pt. Shared that she felt sad and disappointed that she was not able to make plans to finalize the trip. Pt. Shared that she believes that her family truly loves and supports her. When the group therapist discussed the patient's sadness regarding the trip deposit, Pt. Asked "How did you know that?" and appeared agitated with paranoia. The patient was able to receive feedback from the group that this was information that she had shared during the group process. Pt. At first appeared in disbelief, but was able to remain with the group. Pt. Requested to leave the group because she did not want to attend grief and loss. Pt. Stayed because the counselor explained to her that the chaplain was late and probably would not be in today's group, and if he did come she would remain in group for support.     Group Time: 10:30-12:00  Participation Level:  Minimal  Behavioral Response: Rigid  Type of Therapy: Psycho-education Group  Summary of Progress: Pt. Participated in grief and loss session facilitated by Jeanella Craze. Pt. Was walked upstairs by the group counselor exit the building at the end of group.   Nancie Neas, LPC
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
## 2015-09-29 NOTE — Tx Team (Signed)
Initial Interdisciplinary Treatment Plan   PATIENT STRESSORS: Medication change or noncompliance   PATIENT STRENGTHS: Communication skills Supportive family/friends   PROBLEM LIST: Problem List/Patient Goals Date to be addressed Date deferred Reason deferred Estimated date of resolution  Psychosis/paranoia 09/29/15     Anxiety 09/29/15     "go home" 09/29/15     "Not worry about my family" 09/29/15                                    DISCHARGE CRITERIA:  Need for constant or close observation no longer present Verbal commitment to aftercare and medication compliance  PRELIMINARY DISCHARGE PLAN: Outpatient therapy Medication managment  PATIENT/FAMIILY INVOLVEMENT: This treatment plan has been presented to and reviewed with the patient, Colleen Peck.  The patient and family have been given the opportunity to ask questions and make suggestions.  Barbette Or Deronte Solis 09/29/2015, 8:13 PM

## 2015-09-29 NOTE — ED Notes (Signed)
Pt continues to feel like she is "going to die."  Pt reassured that it did not appear she was going to die today.

## 2015-09-29 NOTE — Progress Notes (Signed)
D: Pt alert, oriented to self and place. Cooperative with care and unit routines at present. Pt compliant with medications as ordered. Denies SI, HI, AVH and pain when assessed. However, pt is disorganized and confused related to situation.  A: Medications administered as ordered. Support, availability and encouragement provided to pt this shift. Q 15 minutes checks maintained on and off unit for safety without self harm gestures or outburst to note at this time.  R: Pt receptive to care. POC maintained.

## 2015-09-29 NOTE — ED Notes (Signed)
Pt given inhaler per pt request.

## 2015-09-29 NOTE — Progress Notes (Signed)
Adult Psychoeducational Group Note  Date:  09/29/2015 Time:  9:01 PM  Group Topic/Focus:  Wrap-Up Group:   The focus of this group is to help patients review their daily goal of treatment and discuss progress on daily workbooks.  Participation Level:  Active  Participation Quality:  Appropriate  Affect:  Appropriate  Cognitive:  Appropriate  Insight: Appropriate  Engagement in Group:  Engaged  Modes of Intervention:  Discussion  Additional Comments:  The patient attended all groups today.The patient also said that her day was ok and rates today a 7.  Nash Shearer 09/29/2015, 9:01 PM

## 2015-09-29 NOTE — Telephone Encounter (Signed)
A:  Colleen call to pt, since she didn't attend [facility]-IOP today, nor did she call.  Left vm for pt to return writer's phone call.  Will have pt to see Dr. Lovena Le tomorrow.

## 2015-09-29 NOTE — BH Assessment (Signed)
[facility] Assessment Progress Note  Per morning shift report, pt is under consideration for admission at [facility].  They has questions regarding a boot that pt is reportedly wearing.  This Probation officer spoke to pt and her nurse.  Pt reports that she was given the boot a few days ago after separating a calf muscle.  She is not currently using it at [facility].  She is able to ambulate and take care of all ADL's in the ED.  At 10:25 I called [facility] and informed the intake person.  Decision is pending.  Jalene Mullet, [facility] Triage Specialist [phone]

## 2015-09-29 NOTE — ED Notes (Addendum)
Pt appears very paranoid and stated,"I do not know what questions I need to ask." pt asked the writer, "are there any killings in TV today? " pt is very needy and has asked the writer three times where she will be going from here. 1:15pm- Phoned [facility] to give report. Pt to go to room 500-2. Instructed to phone [facility] back at 1:30PM as no one is available to take report. 2pm -Report to [facility] and pt will be transported at 2:45pm via [facility].

## 2015-09-29 NOTE — ED Notes (Signed)
Pt tearful and saying she is "going to die" and she caused "harm to people." Pt given ativan to help relax the pt.

## 2015-09-30 ENCOUNTER — Other Ambulatory Visit (HOSPITAL_COMMUNITY): Payer: BLUE CROSS/BLUE SHIELD | Admitting: Psychiatry

## 2015-09-30 ENCOUNTER — Ambulatory Visit: Payer: Self-pay | Admitting: Family Medicine

## 2015-09-30 ENCOUNTER — Encounter (HOSPITAL_COMMUNITY): Payer: Self-pay | Admitting: Psychiatry

## 2015-09-30 DIAGNOSIS — F3164 Bipolar disorder, current episode mixed, severe, with psychotic features: Secondary | ICD-10-CM | POA: Diagnosis present

## 2015-09-30 DIAGNOSIS — F4312 Post-traumatic stress disorder, chronic: Secondary | ICD-10-CM | POA: Diagnosis present

## 2015-09-30 DIAGNOSIS — F4310 Post-traumatic stress disorder, unspecified: Secondary | ICD-10-CM | POA: Diagnosis present

## 2015-09-30 MED ORDER — LEVOTHYROXINE SODIUM 112 MCG PO TABS
224.0000 ug | ORAL_TABLET | Freq: Every day | ORAL | Status: DC
  Administered 2015-09-30 – 2015-10-06 (×7): 224 ug via ORAL
  Filled 2015-09-30 (×9): qty 2

## 2015-09-30 MED ORDER — RISPERIDONE 0.25 MG PO TABS
0.2500 mg | ORAL_TABLET | ORAL | Status: DC
  Administered 2015-09-30 – 2015-10-02 (×7): 0.25 mg via ORAL
  Filled 2015-09-30 (×12): qty 1

## 2015-09-30 MED ORDER — LORAZEPAM 2 MG/ML IJ SOLN
1.0000 mg | Freq: Four times a day (QID) | INTRAMUSCULAR | Status: DC | PRN
Start: 2015-09-30 — End: 2015-10-06

## 2015-09-30 MED ORDER — BENZTROPINE MESYLATE 0.5 MG PO TABS
0.5000 mg | ORAL_TABLET | ORAL | Status: DC
  Administered 2015-09-30 – 2015-10-06 (×19): 0.5 mg via ORAL
  Filled 2015-09-30 (×25): qty 1

## 2015-09-30 MED ORDER — LORAZEPAM 1 MG PO TABS
1.0000 mg | ORAL_TABLET | Freq: Four times a day (QID) | ORAL | Status: DC | PRN
  Administered 2015-10-04: 1 mg via ORAL
  Filled 2015-09-30: qty 1

## 2015-09-30 NOTE — BHH Suicide Risk Assessment (Signed)
[facility] Admission Suicide Risk Assessment   Nursing information obtained from:    Demographic factors:    Current Mental Status:    Loss Factors:    Historical Factors:    Risk Reduction Factors:     Total Time spent with patient: 30 minutes Principal Problem: Bipolar disorder, curr episode mixed, severe, with psychotic features ([place]) Diagnosis:   Patient Active Problem List   Diagnosis Date Noted  . Bipolar disorder, curr episode mixed, severe, with psychotic features ([place]) [F31.64] 09/30/2015  . PTSD (post-traumatic stress disorder) [F43.10] 09/30/2015  . Medical clearance for psychiatric admission [Z00.8]   . Paranoia ([place]) [F22]   . Anemia [D64.9] 01/30/2015  . Unspecified vitamin D deficiency [E55.9] 05/22/2013  . Hypothyroidism (acquired) [E03.9] 11/29/2011  . Obesity [E66.9] 09/08/2006  . RHINITIS, ALLERGIC [J30.9] 09/08/2006  . Asthma [J45.909] 09/08/2006  . ECZEMA, ATOPIC DERMATITIS [L20.89] 09/08/2006   Subjective Data: Please see H&P.   Continued Clinical Symptoms:  Alcohol Use Disorder Identification Test Final Score (AUDIT): 0 The "Alcohol Use Disorders Identification Test", Guidelines for Use in Primary Care, Second Edition.  World Pharmacologist [facility]). Score between 0-7:  no or low risk or alcohol related problems. Score between 8-15:  moderate risk of alcohol related problems. Score between 16-19:  high risk of alcohol related problems. Score 20 or above:  warrants further diagnostic evaluation for alcohol dependence and treatment.   CLINICAL FACTORS:   Previous Psychiatric Diagnoses and Treatments   Musculoskeletal: Strength & Muscle Tone: within normal limits Gait & Station: normal Patient leans: N/A  Psychiatric Specialty Exam: Review of Systems  Psychiatric/Behavioral: Positive for depression. The patient is nervous/anxious.   All other systems reviewed and are negative.   Blood pressure 124/64, pulse 116, temperature 98.2 F (36.8 C),  temperature source Oral, resp. rate 20, height 5' 6.5" (1.689 m), weight 106.142 kg (234 lb), SpO2 100 %.Body mass index is 37.21 kg/(m^2).                      Please see H&P.                                   COGNITIVE FEATURES THAT CONTRIBUTE TO RISK:  Closed-mindedness, Polarized thinking and Thought constriction (tunnel vision)    SUICIDE RISK:   Mild:  Suicidal ideation of limited frequency, intensity, duration, and specificity.  There are no identifiable plans, no associated intent, mild dysphoria and related symptoms, good self-control (both objective and subjective assessment), few other risk factors, and identifiable protective factors, including available and accessible social support.  PLAN OF CARE: Please see H&P.   I certify that inpatient services furnished can reasonably be expected to improve the patient's condition.   Trinetta Alemu, MD 09/30/2015, 11:43 AM
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
## 2015-09-30 NOTE — Patient Instructions (Signed)
Pt was admitted back on the inpt unit [facility]) on 09-27-15 due to A/V hallucinations and paranoia.

## 2015-09-30 NOTE — Progress Notes (Addendum)
Adult Psychoeducational Group Note  Date:  09/30/2015 Time:  09:15 am  Group Topic/Focus:  Recovery Goals:   The focus of this group is to identify appropriate goals for recovery and establish a plan to achieve them.  Participation Level: Minimal  Participation Quality:  Appropriate  Affect:  Flat  Cognitive:  Alert and Oriented  Insight: Lacking  Engagement in Group:  Lacking  Modes of Intervention:  Activity, Discussion, Education and Support  Additional Comments:  Pt attends groups, participates minimally.   Elenore Rota 09/30/2015, 11:38 AM

## 2015-09-30 NOTE — BHH Group Notes (Signed)
[facility] Group Notes:  (Nursing/MHT/Case Management/Adjunct)  Date:  09/30/2015  Time:  4:30 PM  Type of Therapy:  Psychoeducational Skills  Participation Level:  None  Participation Quality:  Attentive  Affect:  Flat  Cognitive:  Lacking  Insight:  Limited  Engagement in Group:  Limited  Modes of Intervention:  Discussion  Summary of Progress/Problems:  Pt. Attended AM group , but showed no participation and no input  Colleen Peck 09/30/2015, 4:30 PM

## 2015-09-30 NOTE — Progress Notes (Signed)
Patient ID: Colleen Peck, female   DOB: 10/14/1980, 35 y.o.   MRN: [id number]  Pt currently presents with a blunted affect and depressed behavior. Per self inventory, pt rates depression at a 0, hopelessness 0 and anxiety 4. Pt's daily goal is to "self care and discharge" and they intend to do so by "going to group." Pt reports fair sleep, a fair appetite, normal energy and good concentration. Pt reports concerns over which medications she is taking, asks writer to review them multiple times.   Pt provided with medications per providers orders. Pt's labs and vitals were monitored throughout the day. Pt supported emotionally and encouraged to express concerns and questions. Pt educated on medications and the importance of adhering to medication regimen.   Pt's safety ensured with 15 minute and environmental checks. Pt currently denies SI/HI and A/V hallucinations. Pt verbally agrees to seek staff if SI/HI or A/VH occurs and to consult with staff before acting on these thoughts. Pt remains in her room for most of the day, limited interaction with peers. Will continue POC.
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
## 2015-09-30 NOTE — Progress Notes (Signed)
Colleen Peck is a 35 y.o. ,single, employed, African American female, who was referred per [facility]; treatment for worsening mood symptoms. Pt denieds SI/HI or A/V hallucinations upon admission. Pt reported feeling more so overwhelmed than depressed at this time. Stressors: (1) Runner, broadcasting/film/video). Stated she can't take another leave from school due to her mental illness, because she has used all the leaves. 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 Has taken some time off from job due to being stressed out. Pt is planning a study trip to [place] which overlaps with her school contracts. 3) Financial Strain 4) Housing: Temporarily had to move back in with mother due to problems with apartment (leaks and pests). Reported it is a difficult transition being back at home due to the number of people residing there.  Pt apparently was re-admitted back on the inpt unit [facility]).  Pt has having a difficult time on 09-26-15 (last day seen in [place]).  *Read note from 09-26-15.  A:  D/C from MH-IOP.  Admit to inpatient unit for stabilization.     Carlis Abbott, RITA, M.Ed, CNA

## 2015-09-30 NOTE — Progress Notes (Signed)
Pt at the time of assessment is sedated in bed; very difficult to understand and unable to answer most questions. Pt endorses mild depression and mild anxiety. Pt however, denies SI. Pt did not look to be in any distress. Support, encouragement, and safe environment provided.  15-minute safety checks continue. Meds not given as Pt was very difficult to arouse.

## 2015-09-30 NOTE — BHH Group Notes (Signed)
[facility] LCSW Group Therapy  09/30/2015 , 1:29 PM   Type of Therapy:  Group Therapy  Participation Level:  Active  Participation Quality:  Attentive  Affect:  Appropriate  Cognitive:  Alert  Insight:  Improving  Engagement in Therapy:  Engaged  Modes of Intervention:  Discussion, Exploration and Socialization  Summary of Progress/Problems: Today's group focused on the term Diagnosis.  Participants were asked to define the term, and then pronounce whether it is a negative, positive or neutral term.  Stayed the entire time.  Minimal interaction.  When pressed, talked about diagnosis as a negative thing that "feels like a weight around my neck," but also acknowledged the potential for positives "if it leads to effective treatment."  Acknowledged that she has been very stressed "because I have a difficult time with choices."  Roque Lias B 09/30/2015 , 1:29 PM

## 2015-09-30 NOTE — Progress Notes (Signed)
Patient ID: Colleen Peck, female   DOB: [birth year], 35 y.o.   MRN: [id number] Discharge Note  Patient:  Colleen Peck is an 35 y.o., female DOB:  [birth year]  Date of Admission:  09/17/2015  Date of Discharge:  09/26/2015  Reason for Admission:depression and anxiety  IOP Course:  Colleen Peck attended and participated.  She said she was not benefitting from the program and did do much better in the one on one time.  She gave a long history of mood swings and some mania like traits but no clear cut manic episodes but clear depression and anxiety.  She could easily get overwhelmed but in talking one on one could reason and calm herself.  She definitely felt overwhelmed with the stress in her life and thinking about the stress would lead to anxiety and depression.  She was admitted to inpatient psychiatry the weekend after her last day of IOP and will be likely returned to outpatient individual therapy upon discharge as IOP doses not seem helpful.  Mental Status at [place] depressed and anxious with erratic ability to find relief  Lab Results: No results found for this or any previous visit (from the past 48 hour(s)).  No current facility-administered medications for this visit.  Current outpatient prescriptions:  .  [DISCONTINUED] benztropine (COGENTIN) 1 MG tablet, Take 0.5 mg by mouth daily., Disp: , Rfl:   Facility-Administered Medications Ordered in Other Visits:  .  acetaminophen (TYLENOL) tablet 650 mg, 650 mg, Oral, Q6H PRN, Patrecia Pour, NP .  albuterol (PROVENTIL HFA;VENTOLIN HFA) 108 (90 Base) MCG/ACT inhaler 2 puff, 2 puff, Inhalation, Q6H PRN, Patrecia Pour, NP .  alum & mag hydroxide-simeth (MAALOX/MYLANTA) 200-200-20 MG/5ML suspension 30 mL, 30 mL, Oral, Q4H PRN, Patrecia Pour, NP .  benztropine (COGENTIN) tablet 0.5 mg, 0.5 mg, Oral, [id number], Saramma Eappen, MD .  budesonide (PULMICORT) nebulizer solution 0.25 mg, 0.25 mg, Nebulization, BID, Patrecia Pour, NP, 0.25  mg at 09/30/15 0837 .  diclofenac (VOLTAREN) EC tablet 75 mg, 75 mg, Oral, BID, Saramma Eappen, MD, 0 mg at 09/29/15 1820 .  ferrous sulfate tablet 325 mg, 325 mg, Oral, Q breakfast, Patrecia Pour, NP, 325 mg at 09/30/15 0837 .  levothyroxine (SYNTHROID, LEVOTHROID) tablet 224 mcg, 224 mcg, Oral, QAC breakfast, Laverle Hobby, PA-C, 224 mcg at 09/30/15 0836 .  LORazepam (ATIVAN) tablet 1 mg, 1 mg, Oral, Q6H PRN **OR** LORazepam (ATIVAN) injection 1 mg, 1 mg, Intramuscular, Q6H PRN, Saramma Eappen, MD .  magnesium hydroxide (MILK OF MAGNESIA) suspension 30 mL, 30 mL, Oral, Daily PRN, Patrecia Pour, NP .  Oxcarbazepine (TRILEPTAL) tablet 300 mg, 300 mg, Oral, BID, Patrecia Pour, NP, 300 mg at 09/30/15 0837 .  risperiDONE (RISPERDAL) tablet 0.25 mg, 0.25 mg, Oral, BH-q8a2phs, Saramma Eappen, MD .  traZODone (DESYREL) tablet 100 mg, 100 mg, Oral, QHS, Patrecia Pour, NP, 100 mg at 09/30/15 0050  Axis Diagnosis:  Major depression, recurrent, severe without psychosis.  History of bipolar 1 diagnosis with most recent episode of depression.  Anxiety seemed situational but was severe.   Level of Care:  IOP  Discharge destination:  Other:  [facility]  Is patient on multiple antipsychotic therapies at discharge:  No    Has Patient had three or more failed trials of antipsychotic monotherapy by history:  Negative  Patient phone:  [phone] (home)  Patient address:   [address] [place] [address],   Follow-up recommendations:  Other:  as indicated by hospital staff  Comments:  Great potential, seems to respond to individual therapy best  The patient received suicide prevention pamphlet:  admitted to inpatient before scheduled discharge   Donnelly Angelica 09/30/2015, 1:58 PM

## 2015-10-01 ENCOUNTER — Other Ambulatory Visit (HOSPITAL_COMMUNITY): Payer: BLUE CROSS/BLUE SHIELD

## 2015-10-01 MED ORDER — DICLOFENAC SODIUM 25 MG PO TBEC
75.0000 mg | DELAYED_RELEASE_TABLET | Freq: Two times a day (BID) | ORAL | Status: DC | PRN
  Administered 2015-10-03: 75 mg via ORAL
  Filled 2015-10-01 (×2): qty 3

## 2015-10-01 NOTE — Tx Team (Signed)
Interdisciplinary Treatment Plan Update (Adult)  Date:  10/01/2015   Time Reviewed:  1:47 PM   Progress in Treatment: Attending groups: Yes. Participating in groups:  Minimally Taking medication as prescribed:  Yes. Tolerating medication:  Yes. Family/Significant other contact made:  No Patient understands diagnosis:  Yes  As evidenced by seeking help with "my paranoia and racing thoughts" Discussing patient identified problems/goals with staff:  Yes, see initial care plan. Medical problems stabilized or resolved:  Yes. Denies suicidal/homicidal ideation: Yes. Issues/concerns per patient self-inventory:  No. Other:  New problem(s) identified:  Discharge Plan or Barriers: see below  Reason for Continuation of Hospitalization: Anxiety Medication stabilization Other; describe Paranoia  Comments: Colleen Peck is a 35 y.o. single AA female who has a past hx of bipolar d/o and PTSD. Colleen Peck presents with worsening anxiety/racing thoughts and paranoia . Will start a trial of Risperidone 0.25 mg po tid for mood sx/paranoia. Will add Cogentin 0.5 mg po tid for EPS. Will continue Trileptal 300 mg po bid for mood sx. Will continue Trazodone 100 mg po qhs for sleep. Will make available PRN medications for anxiety/agitation.  Estimated length of stay: 4-5 days  New goal(s):  Review of initial/current patient goals per problem list:   Review of initial/current patient goals per problem list:  1. Goal(s): Patient will participate in aftercare plan   Met: Yes   Target date: 3-5 days post admission date   As evidenced by: Patient will participate within aftercare plan AEB aftercare provider and housing plan at discharge being identified. 10/01/15:  Return home, follow up outpt     3. Goal(s): Patient will demonstrate decreased signs and symptoms of anxiety.   Met: No   Target date: 3-5 days post admission date   As evidenced by: Patient will utilize self rating of  anxiety at 3 or below and demonstrated decreased signs of anxiety, or be deemed stable for discharge by MD 10/01/15:  Rates anxiety a 4 today       5. Goal(s): Patient will demonstrate decreased signs of psychosis  * Met: No  * Target date: 3-5 days post admission date  * As evidenced by: Patient will demonstrate decreased frequency of AVH or return to baseline function 10/01/15:  Pt admits to on-going paranoia           Attendees: Patient:  10/01/2015 1:47 PM   Family:   10/01/2015 1:47 PM   Physician:  Ursula Alert, MD 10/01/2015 1:47 PM   Nursing:   Gaylan Gerold, RN 10/01/2015 1:47 PM   CSW:    Roque Lias, LCSW   10/01/2015 1:47 PM   Other:  10/01/2015 1:47 PM   Other:   10/01/2015 1:47 PM   Other:  Lars Pinks, Nurse CM 10/01/2015 1:47 PM   Other:   10/01/2015 1:47 PM   Other:  Norberto Sorenson, [place]  10/01/2015 1:47 PM   Other:  10/01/2015 1:47 PM   Other:  10/01/2015 1:47 PM   Other:  10/01/2015 1:47 PM   Other:  10/01/2015 1:47 PM   Other:  10/01/2015 1:47 PM   Other:   10/01/2015 1:47 PM    Scribe for Treatment Team:   Trish Mage, 10/01/2015 1:47 PM

## 2015-10-01 NOTE — Plan of Care (Signed)
Problem: Alteration in thought process Goal: STG-Patient is able to follow short directions Outcome: Progressing Pt has been cooperative with unit routines / schedules. Attends groups and is verbally redirectable at present.   Problem: Ineffective individual coping Goal: STG: Patient will remain free from self harm Outcome: Progressing Q 15 minutes checks maintained for safety without gestures of self harm or behavioral outburst to note at thus far this shift.

## 2015-10-01 NOTE — Progress Notes (Signed)
D: Pt denied SI, HI, AVH and pain when assessed. Presents with animated affect but is still paranoid about family members trying to attack her related to things she has to say. Verbalized concerns related to d/c, encouraged to wait and be assessed by MD. Compliant with medications and nebulizer treatment as ordered when offered. Visible in groups as scheduled. Went off unit for meals and returned without issues. Boot in place on left foot, cap refill checked and color WNL.  A: Scheduled medications administered as per order. Emotional support, encouraegment and availability provided to pt. Safety maintained on Q 15 minutes checks as ordered without self harm gestures or outburst to note at this time.  R: Pt receptive to care. Denies adverse drug reactions when assessed. POC maintained.

## 2015-10-01 NOTE — Progress Notes (Signed)
Patient ID: Colleen Peck, female   DOB: 03/24/1981, 35 y.o.   MRN: [id number] D: Client visible on the unit, reports of her day "sleeping most of the time" hadn't been up like I should because of medication, but feel like I'll be able to attend this group." Client suspicious, just smiles and looks toward window when asked about voices. Client report fall weeks prior to admission, boot intact to right leg. Client denies pain.  A: Writer encouraged group, medications reviewed administered as prescribed. Staff will monitor q29min for safety. R: Client is safe on the unit, attended group.

## 2015-10-01 NOTE — Progress Notes (Signed)
D. Pt had been up and visible in milieu this evening, seen interacting some with peers. Pt has appeared paranoid this evening, and kept asking if she was going to be safe and kept wanting to contact her family to make sure everything was ok. Pt was able to receive all medications without incident but needed reassurance about her safety in the milieu. A. Support provided. R. Safety maintained, will continue to monitor.

## 2015-10-01 NOTE — Progress Notes (Signed)
[facility] MD Progress Note  10/01/2015 12:09 PM Colleen Peck  MRN:  [id number] Subjective:  Patient states " I will be lying to you if I say I am not paranoid.'  Objective:Colleen Peck is a 35 y.o., single AA female , who is employed as an Building control surveyor , is also in school at [employer] ,lives by self in [place] , has a past hx of depression, PTSD as well as Bipolar do, who presented to [facility] for paranoia.  Patient seen and chart reviewed.Discussed patient with treatment team.  Pt today is seen as anxious, paranoid , continues to ruminate about the events that led to her hospital stay. She reports that she has no memory of some of the events that happened , but its all coming back to her. Pt continues to feel paranoid and unsafe , worries about going back to her apartment once she gets back. Pt is also anxious about her lower extremity sprain and she feels that her inability to be move around freely is making her more restless. Pt has been tolerating her medications well. Denies any ADRs other than feeling slowed down. Pt continues to need support per staff.      Principal Problem: Bipolar disorder, curr episode mixed, severe, with psychotic features ([place]) Diagnosis:   Patient Active Problem List   Diagnosis Date Noted  . Bipolar disorder, curr episode mixed, severe, with psychotic features ([place]) [F31.64] 09/30/2015  . PTSD (post-traumatic stress disorder) [F43.10] 09/30/2015  . Medical clearance for psychiatric admission [Z00.8]   . Paranoia ([place]) [F22]   . Anemia [D64.9] 01/30/2015  . Unspecified vitamin D deficiency [E55.9] 05/22/2013  . Hypothyroidism (acquired) [E03.9] 11/29/2011  . Obesity [E66.9] 09/08/2006  . RHINITIS, ALLERGIC [J30.9] 09/08/2006  . Asthma [J45.909] 09/08/2006  . ECZEMA, ATOPIC DERMATITIS [L20.89] 09/08/2006   Total Time spent with patient: 30 minutes  Past Psychiatric History: Pt has a hx of depression, PTSD, Bipolar do. Pt currently follows up with  a provider [place]. Pt used to follow up with Dr.Arfeen in the past the patient denies hx of suicide attempts.Pt has had multiple hospitalizations at [facility] ,[facility] in the recent past.   Past Medical History:  Past Medical History  Diagnosis Date  . Asthma   . Seasonal allergies   . Depression   . PTSD (post-traumatic stress disorder)   . Thyroid disease 2009    Graves disease (pt reported resolved); hypothyriodism  . Abnormal pap     pt reports abnl pap many years ago.  Nl since then.  . Palpitations 03/12/2008    Past Surgical History  Procedure Laterality Date  . Dilation and curettage of uterus  March 2006   Family History:  Family History  Problem Relation Age of Onset  . Drug abuse Father   . Depression Maternal Aunt   . Depression Maternal Grandmother   . Anxiety disorder Maternal Grandmother   . COPD Maternal Grandmother   . Suicidality Cousin   . Depression Cousin   . Bipolar disorder Cousin   . Depression Maternal Aunt   . Hypertension Mother   . Depression Mother   . Diabetes Paternal Grandfather   . COPD Paternal Grandmother   . Heart disease Neg Hx    Family Psychiatric  History: Patient reports hx of depression in her grandmother,aunt , bipolar do in her cousin. A cousin committed suicide.  Social History: Patient currently lives with mother in [place],. Pt goes to grad school in [place], is taking time off since she  is overwhelmed . 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 0.6 oz/week  . 1 Glasses of wine per week    Comment: past use of alcohol in '08-'09     History  Drug Use  . Yes  . Special: Marijuana    Comment: past use of marijuana in '08-'09. occasional eats brownies w/ marijuana  before thanksgiving    Social History   Social History  . Marital Status: Single    Spouse Name: N/A  . Number of Children: N/A  . Years of Education: N/A   Social History Main Topics  . Smoking status: Never Smoker   . Smokeless  tobacco: Never Used  . Alcohol Use: 0.6 oz/week    1 Glasses of wine per week     Comment: past use of alcohol in '08-'09  . Drug Use: Yes    Special: Marijuana     Comment: past use of marijuana in '08-'09. occasional eats brownies w/ marijuana  before thanksgiving  . Sexual Activity: Not Currently    Birth Control/ Protection: Abstinence   Other Topics Concern  . 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   Additional Social History:                         Sleep: Fair  Appetite:  Fair  Current Medications: Current Facility-Administered Medications  Medication Dose Route Frequency Provider Last Rate Last Dose  . acetaminophen (TYLENOL) tablet 650 mg  650 mg Oral Q6H PRN Patrecia Pour, NP      . albuterol (PROVENTIL HFA;VENTOLIN HFA) 108 (90 Base) MCG/ACT inhaler 2 puff  2 puff Inhalation Q6H PRN Patrecia Pour, NP      . alum & mag hydroxide-simeth (MAALOX/MYLANTA) 200-200-20 MG/5ML suspension 30 mL  30 mL Oral Q4H PRN Patrecia Pour, NP      . benztropine (COGENTIN) tablet 0.5 mg  0.5 mg Oral BH-[id number] Ursula Alert, MD   0.5 mg at 10/01/15 0817  . budesonide (PULMICORT) nebulizer solution 0.25 mg  0.25 mg Nebulization BID Patrecia Pour, NP   0.25 mg at 10/01/15 0817  . diclofenac (VOLTAREN) EC tablet 75 mg  75 mg Oral BID PRN Ursula Alert, MD      . ferrous sulfate tablet 325 mg  325 mg Oral Q breakfast Patrecia Pour, NP   325 mg at 10/01/15 0817  . levothyroxine (SYNTHROID, LEVOTHROID) tablet 224 mcg  224 mcg Oral QAC breakfast Laverle Hobby, PA-C   224 mcg at 10/01/15 0654  . LORazepam (ATIVAN) tablet 1 mg  1 mg Oral Q6H PRN Ursula Alert, MD       Or  . LORazepam (ATIVAN) injection 1 mg  1 mg Intramuscular Q6H PRN Deyvi Bonanno, MD      . magnesium hydroxide (MILK OF MAGNESIA) suspension 30 mL  30 mL Oral Daily PRN Patrecia Pour, NP      . Oxcarbazepine (TRILEPTAL)  tablet 300 mg  300 mg Oral BID Patrecia Pour, NP   300 mg at 10/01/15 0817  . risperiDONE (RISPERDAL) tablet 0.25 mg  0.25 mg Oral BH-[id number] Ursula Alert, MD   0.25 mg at 10/01/15 0816  . traZODone (DESYREL) tablet 100 mg  100 mg Oral QHS Patrecia Pour, NP   100 mg at 09/30/15  2039    Lab Results: No results found for this or any previous visit (from the past 48 hour(s)).  Blood Alcohol level:  Lab Results  Component Value Date   ETH <5 09/27/2015   ETH <5 08/18/2015    Physical Findings: AIMS: Facial and Oral Movements Muscles of Facial Expression: None, normal Lips and Perioral Area: None, normal Jaw: None, normal Tongue: None, normal,Extremity Movements Upper (arms, wrists, hands, fingers): None, normal Lower (legs, knees, ankles, toes): None, normal, Trunk Movements Neck, shoulders, hips: None, normal, Overall Severity Severity of abnormal movements (highest score from questions above): None, normal Incapacitation due to abnormal movements: None, normal Patient's awareness of abnormal movements (rate only patient's report): No Awareness, Dental Status Current problems with teeth and/or dentures?: No Does patient usually wear dentures?: No  CIWA:    COWS:     Musculoskeletal: Strength & Muscle Tone: within normal limits Gait & Station: normal Patient leans: N/A  Psychiatric Specialty Exam: Review of Systems  Musculoskeletal:       Left leg in boot for LE sprain  Psychiatric/Behavioral: The patient is nervous/anxious.   All other systems reviewed and are negative.   Blood pressure 106/61, pulse 84, temperature 98.5 F (36.9 C), temperature source Oral, resp. rate 20, height 5' 6.5" (1.689 m), weight 106.142 kg (234 lb), SpO2 100 %.Body mass index is 37.21 kg/(m^2).  General Appearance: Casual  Eye Contact::  Fair  Speech:  Normal Rate  Volume:  Normal  Mood:  Anxious  Affect:  Congruent  Thought Process:  Coherent  Orientation:  Full (Time, Place, and  Person)  Thought Content:  Paranoid Ideation and Rumination  Suicidal Thoughts:  No is a danger to self or others due to paranoia  Homicidal Thoughts:  No  Memory:  Immediate;   Fair Recent;   Fair Remote;   Fair  Judgement:  Impaired  Insight:  Shallow  Psychomotor Activity:  Restlessness  Concentration:  Fair  Recall:  [place]: Fair  Akathisia:  No  Handed:  Right  AIMS (if indicated):     Assets:  Desire for Improvement  ADL's:  Intact  Cognition: WNL  Sleep:  Number of Hours: 5.25   Treatment Plan Summary: Daily contact with patient to assess and evaluate symptoms and progress in treatment and Medication management  Will continue Risperidone 0.25 mg po tid for mood sx/paranoia.Will not increase dose today due to her feeling sluggish. Will continue Cogentin 0.5 mg po tid for EPS. Will continue Trileptal 300 mg po bid for mood sx. Will continue Trazodone 100 mg po qhs for sleep. Will make available PRN medications for anxiety/agitation.  Will continue to monitor vitals ,medication compliance and treatment side effects while patient is here.  Will monitor for medical issues as well as call consult as needed.  Reviewed labs - EKG for qtc - wnl. CSW will continue working on disposition.  Recreational therapy consult. Patient to participate in therapeutic milieu . Pt will benefit from CBT on an out patient basis.    Lateisha Thurlow, MD 10/01/2015, 12:09 PM

## 2015-10-01 NOTE — BHH Group Notes (Signed)
[place] LCSW Group Therapy  10/01/2015 1:32 PM  Type of Therapy: Group Therapy  Participation Level: Active  Participation Quality: Attentive  Affect: Flat  Cognitive: Oriented  Insight: Limited  Engagement in Therapy: Engaged  Modes of Intervention: Discussion and Socialization  Summary of Progress/Problems: Shanon Brow from the [facility] was here to tell his story of recovery and play his guitar.  Pt stayed for the entire group and was alert. Pt did not participate and had a flat affect.  Kara Mead. Marshell Levan 10/01/2015 1:32 PM

## 2015-10-02 ENCOUNTER — Other Ambulatory Visit (HOSPITAL_COMMUNITY): Payer: BLUE CROSS/BLUE SHIELD

## 2015-10-02 MED ORDER — RISPERIDONE 0.5 MG PO TABS
0.5000 mg | ORAL_TABLET | Freq: Every day | ORAL | Status: DC
  Administered 2015-10-02 – 2015-10-05 (×4): 0.5 mg via ORAL
  Filled 2015-10-02 (×7): qty 1

## 2015-10-02 MED ORDER — RISPERIDONE 0.25 MG PO TABS
0.2500 mg | ORAL_TABLET | ORAL | Status: DC
  Administered 2015-10-03 – 2015-10-06 (×8): 0.25 mg via ORAL
  Filled 2015-10-02 (×11): qty 1

## 2015-10-02 NOTE — BHH Group Notes (Signed)
[facility] Group Notes:  (Counselor/Nursing/MHT/Case Management/Adjunct)  10/02/2015 1:15PM  Type of Therapy:  Group Therapy  Participation Level:  Active  Participation Quality:  Appropriate  Affect:  Flat  Cognitive:  Oriented  Insight:  Improving  Engagement in Group:  Limited  Engagement in Therapy:  Limited  Modes of Intervention:  Discussion, Exploration and Socialization  Summary of Progress/Problems: The topic for group was balance in life.  Pt participated in the discussion about when their life was in balance and out of balance and how this feels.  Pt discussed ways to get back in balance and short term goals they can work on to get where they want to be. "I feel like I am in a constant state of stress.  I feel like I have 2 people there for me no matter what, so that's a good thing.  They are my mom and my sister, and my daddy even showed up now.  That's a good thing."  My goal is still to get back into school to work, and also to finish my master's degree. Also I need to find an aprtment and a roommate.  What was the original question?"   Trish Mage 10/02/2015 1:03 PM

## 2015-10-02 NOTE — BHH Group Notes (Signed)
[facility] Group Notes:  (Nursing/MHT/Case Management/Adjunct)  Date:  10/02/2015  Time:  3:15 PM  Type of Therapy:  Nurse Education  Participation Level:  Active  Participation Quality:  Appropriate and Attentive  Affect:  Appropriate  Cognitive:  Alert and Appropriate  Insight:  Appropriate and Good  Engagement in Group:  Engaged and Improving  Modes of Intervention:  Discussion and Education    Summary of Progress/Problems: Topic was on leisure and lifestyle changes. Discussed the importance of choosing a healthy leisure activities. Group encouraged to surround themselves with positive and healthy group/support system when changing to a healthy lifestyle. Patient was receptive and contributed. Patient states, "I want to learn to love myself and others more."    Mart Piggs 10/02/2015, 3:15 PM

## 2015-10-02 NOTE — Progress Notes (Signed)
Recreation Therapy Notes  03.23.2017 approximately 2:30pm Per MD order LRT met with patient to determine ways to enhance tx during admission. Patient friendly on approach, expecting LRT and invited LRT into her room. When asked about circumstances surrounding admission patient stated she was afraid to talk about it because it scared her, specifically that she is fearful people are out to get her family and herself and if she discloses anything to LRT they will be in danger. Patient reports being in a constant state of stress, being worried about everything. Despite admission patient denies that she is overwhelmed with life. Patient reports she primarily uses eating as a coping skill, describing it as a "high." LRT verified patient statement using the word good, patient denied eating makes her feel good, stating again that it gives her a high, but does not describe that as feeling good. Patient additionally enjoys social activities, for example going for walks with friends and calling her friends. Leisure interests are congruent, for example bowling and skating.   Due to patient admission of increased stress LRT introduced patient to diaphragmatic breathing, patient receptive to LRT. Participated in self-regulation technique and demonstrated ability to practice independently post d/c. Patient asked for literature on technique, LRT to provide.   Laureen Ochs Emani Morad, LRT/CTRS   Lane Hacker 10/02/2015 3:45 PM

## 2015-10-02 NOTE — Progress Notes (Signed)
Adult Psychoeducational Group Note  Date:  10/02/2015 Time:  8:10 PM  Group Topic/Focus:  Wrap-Up Group:   The focus of this group is to help patients review their daily goal of treatment and discuss progress on daily workbooks.  Participation Level:  Active  Participation Quality:  Appropriate  Affect:  Appropriate  Cognitive:  Appropriate  Insight: Appropriate  Engagement in Group:  Engaged  Modes of Intervention:  Discussion  Additional Comments:  Pt was pleasant. Pt stated that she had a good day. Pt reported that she completed her goal for the day, which was to learn more coping and self-care skills and to go to groups. Pt noted that she was happy about only falling asleep in one group today.   [name] 10/02/2015, 8:22 PM

## 2015-10-02 NOTE — Progress Notes (Signed)
D: Pt continues to be paranoid and suspicious of others. Denies SI, HI and AVH when assessed. Observed standing in her room staring at window for long intervals during shift. Attended scheduled groups. Pt compliant with medications when offered. A: verbal education done with medication administration. PRN Tylenol given for c/o of left calf pain 7/10. Boot intact to pt's left foot. Support, availability and encouragement  Provided to pt. Q 15 minutes checks maintained for safety.  R: Pt remains safe on and off unit. POC continues.

## 2015-10-02 NOTE — Progress Notes (Signed)
[facility] MD Progress Note  10/02/2015 2:57 PM Colleen Peck  MRN:  [id number] Subjective:  Patient states " I am a bit anxious , but I feel all right today.'   Objective:Colleen Peck is a 35 y.o., single AA female , who is employed as an Building control surveyor , is also in school at [employer] ,lives by self in [place] , has a past hx of depression, PTSD as well as Bipolar do, who presented to [facility] for paranoia.  Patient seen and chart reviewed.Discussed patient with treatment team.  Pt today is seen as anxious,continues to have some paranoia and is observed on the unit as suspicious often per staff. Pt has been compliant on medications , denies ADRs. Pt continues to need support and encouragement.        Principal Problem: Bipolar disorder, curr episode mixed, severe, with psychotic features ([place]) Diagnosis:   Patient Active Problem List   Diagnosis Date Noted  . Bipolar disorder, curr episode mixed, severe, with psychotic features ([place]) [F31.64] 09/30/2015  . PTSD (post-traumatic stress disorder) [F43.10] 09/30/2015  . Medical clearance for psychiatric admission [Z00.8]   . Paranoia ([place]) [F22]   . Anemia [D64.9] 01/30/2015  . Unspecified vitamin D deficiency [E55.9] 05/22/2013  . Hypothyroidism (acquired) [E03.9] 11/29/2011  . Obesity [E66.9] 09/08/2006  . RHINITIS, ALLERGIC [J30.9] 09/08/2006  . Asthma [J45.909] 09/08/2006  . ECZEMA, ATOPIC DERMATITIS [L20.89] 09/08/2006   Total Time spent with patient: 25 minutes  Past Psychiatric History: Pt has a hx of depression, PTSD, Bipolar do. Pt currently follows up with a provider [place]. Pt used to follow up with Dr.Arfeen in the past the patient denies hx of suicide attempts.Pt has had multiple hospitalizations at [facility] ,[facility] in the recent past.   Past Medical History:  Past Medical History  Diagnosis Date  . Asthma   . Seasonal allergies   . Depression   . PTSD (post-traumatic stress disorder)   . Thyroid disease 2009   Graves disease (pt reported resolved); hypothyriodism  . Abnormal pap     pt reports abnl pap many years ago.  Nl since then.  . Palpitations 03/12/2008    Past Surgical History  Procedure Laterality Date  . Dilation and curettage of uterus  March 2006   Family History:  Family History  Problem Relation Age of Onset  . Drug abuse Father   . Depression Maternal Aunt   . Depression Maternal Grandmother   . Anxiety disorder Maternal Grandmother   . COPD Maternal Grandmother   . Suicidality Cousin   . Depression Cousin   . Bipolar disorder Cousin   . Depression Maternal Aunt   . Hypertension Mother   . Depression Mother   . Diabetes Paternal Grandfather   . COPD Paternal Grandmother   . Heart disease Neg Hx    Family Psychiatric  History: Patient reports hx of depression in her grandmother,aunt , bipolar do in her cousin. A cousin committed suicide.  Social History: Patient currently lives with mother in [place],. Pt goes to grad school in [place], is taking time off since she is overwhelmed . 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 0.6 oz/week  . 1 Glasses of wine per week    Comment: past use of alcohol in '08-'09     History  Drug Use  . Yes  . Special: Marijuana    Comment: past use of marijuana in '08-'09. occasional eats brownies w/ marijuana  before thanksgiving    Social History   Social History  . Marital Status: Single    Spouse Name: N/A  . Number of Children: N/A  . Years of Education: N/A   Social History Main Topics  . Smoking status: Never Smoker   . Smokeless tobacco: Never Used  . Alcohol Use: 0.6 oz/week    1 Glasses of wine per week     Comment: past use of alcohol in '08-'09  . Drug Use: Yes    Special: Marijuana     Comment: past use of marijuana in '08-'09. occasional eats brownies w/ marijuana  before thanksgiving  . Sexual Activity: Not Currently    Birth Control/ Protection: Abstinence   Other Topics  Concern  . 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   Additional Social History:                         Sleep: Fair  Appetite:  Fair  Current Medications: Current Facility-Administered Medications  Medication Dose Route Frequency Provider Last Rate Last Dose  . acetaminophen (TYLENOL) tablet 650 mg  650 mg Oral Q6H PRN Patrecia Pour, NP   650 mg at 10/02/15 0900  . albuterol (PROVENTIL HFA;VENTOLIN HFA) 108 (90 Base) MCG/ACT inhaler 2 puff  2 puff Inhalation Q6H PRN Patrecia Pour, NP      . alum & mag hydroxide-simeth (MAALOX/MYLANTA) 200-200-20 MG/5ML suspension 30 mL  30 mL Oral Q4H PRN Patrecia Pour, NP      . benztropine (COGENTIN) tablet 0.5 mg  0.5 mg Oral BH-[id number] Loyed Wilmes, MD   0.5 mg at 10/02/15 1434  . budesonide (PULMICORT) nebulizer solution 0.25 mg  0.25 mg Nebulization BID Patrecia Pour, NP   0.25 mg at 10/02/15 0854  . diclofenac (VOLTAREN) EC tablet 75 mg  75 mg Oral BID PRN Ursula Alert, MD      . ferrous sulfate tablet 325 mg  325 mg Oral Q breakfast Patrecia Pour, NP   325 mg at 10/02/15 0845  . levothyroxine (SYNTHROID, LEVOTHROID) tablet 224 mcg  224 mcg Oral QAC breakfast Laverle Hobby, PA-C   224 mcg at 10/02/15 [id number]  . LORazepam (ATIVAN) tablet 1 mg  1 mg Oral Q6H PRN Ursula Alert, MD       Or  . LORazepam (ATIVAN) injection 1 mg  1 mg Intramuscular Q6H PRN Dajuan Turnley, MD      . magnesium hydroxide (MILK OF MAGNESIA) suspension 30 mL  30 mL Oral Daily PRN Patrecia Pour, NP      . Oxcarbazepine (TRILEPTAL) tablet 300 mg  300 mg Oral BID Patrecia Pour, NP   300 mg at 10/02/15 0845  . [START ON 10/03/2015] risperiDONE (RISPERDAL) tablet 0.25 mg  0.25 mg Oral [id number] Trease Bremner, MD      . risperiDONE (RISPERDAL) tablet 0.5 mg  0.5 mg Oral QHS Kysean Sweet, MD      . traZODone (DESYREL) tablet 100 mg  100 mg Oral QHS Patrecia Pour, NP   100 mg  at 10/01/15 2154    Lab Results: No results found for this or any previous visit (from the past 40 hour(s)).  Blood Alcohol level:  Lab Results  Component Value Date   [facility] <5 09/27/2015   ETH <5 08/18/2015    Physical Findings: AIMS: Facial and Oral  Movements Muscles of Facial Expression: None, normal Lips and Perioral Area: None, normal Jaw: None, normal Tongue: None, normal,Extremity Movements Upper (arms, wrists, hands, fingers): None, normal Lower (legs, knees, ankles, toes): None, normal, Trunk Movements Neck, shoulders, hips: None, normal, Overall Severity Severity of abnormal movements (highest score from questions above): None, normal Incapacitation due to abnormal movements: None, normal Patient's awareness of abnormal movements (rate only patient's report): No Awareness, Dental Status Current problems with teeth and/or dentures?: No Does patient usually wear dentures?: No  CIWA:    COWS:     Musculoskeletal: Strength & Muscle Tone: within normal limits Gait & Station: normal Patient leans: N/A  Psychiatric Specialty Exam: Review of Systems  Musculoskeletal:       Left leg in boot for LE sprain  Psychiatric/Behavioral: The patient is nervous/anxious.   All other systems reviewed and are negative.   Blood pressure 123/76, pulse 110, temperature 98 F (36.7 C), temperature source Oral, resp. rate 20, height 5' 6.5" (1.689 m), weight 106.142 kg (234 lb), SpO2 100 %.Body mass index is 37.21 kg/(m^2).  General Appearance: Casual  Eye Contact::  Fair  Speech:  Normal Rate  Volume:  Normal  Mood:  Anxious  Affect:  Congruent  Thought Process:  Coherent  Orientation:  Full (Time, Place, and Person)  Thought Content:  Paranoid Ideation and Rumination  Suicidal Thoughts:  No is a danger to self or others due to paranoia  Homicidal Thoughts:  No  Memory:  Immediate;   Fair Recent;   Fair Remote;   Fair  Judgement:  Impaired  Insight:  Shallow  Psychomotor  Activity:  Restlessness  Concentration:  Fair  Recall:  [employer] of Knowledge:Fair  Language: Fair  Akathisia:  No  Handed:  Right  AIMS (if indicated):     Assets:  Desire for Improvement  ADL's:  Intact  Cognition: WNL  Sleep:  Number of Hours: 5.75   Treatment Plan Summary::Colleen Peck is a 36 y.o., single AA female , who is employed as an Building control surveyor , is also in school at [employer] ,lives by self in [place] , has a past hx of depression, PTSD as well as Bipolar do, who presented to [facility] for paranoia.Pt continues to be restless, anxious, paranoid. Will continue treatment. Daily contact with patient to assess and evaluate symptoms and progress in treatment and Medication management  Will increase Risperidone to 0.25 mg po bid and 0.5 po qhs  for mood sx/paranoia.Will not increase dose today due to her feeling sluggish. Will continue Cogentin 0.5 mg po tid for EPS. Will continue Trileptal 300 mg po bid for mood sx. Will continue Trazodone 100 mg po qhs for sleep. Will make available PRN medications for anxiety/agitation.  Will continue to monitor vitals ,medication compliance and treatment side effects while patient is here.  Will monitor for medical issues as well as call consult as needed.  Reviewed labs - EKG for qtc - wnl. CSW will continue working on disposition.  Recreational therapy consult. Patient to participate in therapeutic milieu . Pt will benefit from CBT on an out patient basis.    Davia Smyre, MD 10/02/2015, 2:57 PM

## 2015-10-03 ENCOUNTER — Other Ambulatory Visit (HOSPITAL_COMMUNITY): Payer: BLUE CROSS/BLUE SHIELD

## 2015-10-03 NOTE — Progress Notes (Signed)
Adult Psychoeducational Group Note  Date:  10/03/2015 Time:  9:59 PM  Group Topic/Focus:  Wrap-Up Group:   The focus of this group is to help patients review their daily goal of treatment and discuss progress on daily workbooks.  Participation Level:  Minimal  Participation Quality:  Appropriate  Affect:  Flat  Cognitive:  Oriented  Insight: Limited  Engagement in Group:  Limited  Modes of Intervention:  Socialization and Support  Additional Comments:  Patient attended and participated in group tonight. She report that her day has been up and down. She wanted to go home, She felt as if she did lost her temper today,however, was able to keep it together.  She has to work on patience and tolerance.  Salley Scarlet [facility] 10/03/2015, 9:59 PM

## 2015-10-03 NOTE — Progress Notes (Signed)
D: Client visible in dayroom, watching TV. Client still seen smiling inappropriately at times. Client reports not as drowsy today "been in groups today" A: Writer encouraged client to report any concern. Medications reviewed and administered as ordered R: Client is safe on the unit, attended group.

## 2015-10-03 NOTE — Progress Notes (Addendum)
D Clotine  Is OOB UAL on the 500 hall today...she tolerates this well. She makes good eye contact. She smiles pleasantly and says " Im going to get better!". A She completes her daily assessment and on it she wrote she denied SI today and she rated her depression, hopelessness and anxiety " " 1/0/1", respectively.  A She completed her daily assessment and on it she wrote she denied SI and she rated her depression, hopelessness land anxeity " 0/0/0/", respectively.  She approaches this nurse and says " if I tell you something will you promise NOT to put it in my chart? This nurse expalined that I cannot make that promise. Pt looked away and then said " I'm afraid to really tell you my thoughts, otherwise you might ship me off to another hospital". Pt encouraged to be honest with staff and she says " I just want to go hoem". Pt remains guarded, suspicious.

## 2015-10-03 NOTE — Progress Notes (Signed)
Patient ID: Stormie Sheasley, female   DOB: 1980-10-02, 35 y.o.   MRN: [id number] [facility] MD Progress Note  10/03/2015 2:55 PM Sherrica Hoerig  MRN:  [id number]  Subjective: Margaurite reports, " I'm trying to meet my daily goals: rest, self care & discharge".   Objective:Ciara Bayat is a 35 y.o., single AA female, who is employed as an Building control surveyor , is also in school at [employer] ,lives by self in [place], has a past hx of depression, PTSD as well as Bipolar do, who presented to [facility] for paranoia.  Patient seen and chart reviewed.Discussed patient with treatment team.  Pt today is seen as less anxious today. She says she is trying to meet her daily goals which include; rest, self care & discharge. She says her medications are working. She is attending groups. Says she does not feel depressed except when stressed or feeling overwhelmed. She presents as a bit hyper-religious today question why some people say they believe in God, then turn around & do the opposite. She currently denies any SIHI or AVH. She continues to need support and encouragement.  Principal Problem: Bipolar disorder, curr episode mixed, severe, with psychotic features ([place]) Diagnosis:   Patient Active Problem List   Diagnosis Date Noted  . Bipolar disorder, curr episode mixed, severe, with psychotic features ([place]) [F31.64] 09/30/2015  . PTSD (post-traumatic stress disorder) [F43.10] 09/30/2015  . Medical clearance for psychiatric admission [Z00.8]   . Paranoia ([place]) [F22]   . Anemia [D64.9] 01/30/2015  . Unspecified vitamin D deficiency [E55.9] 05/22/2013  . Hypothyroidism (acquired) [E03.9] 11/29/2011  . Obesity [E66.9] 09/08/2006  . RHINITIS, ALLERGIC [J30.9] 09/08/2006  . Asthma [J45.909] 09/08/2006  . ECZEMA, ATOPIC DERMATITIS [L20.89] 09/08/2006   Total Time spent with patient: 15 minutes  Past Psychiatric History: Pt has a hx of depression, PTSD, Bipolar do. Pt currently follows up with a provider Pool[place]. Pt  used to follow up with Dr.Arfeen in the past the patient denies hx of suicide attempts.Pt has had multiple hospitalizations at [facility] ,[facility] in the recent past.   Past Medical History:  Past Medical History  Diagnosis Date  . Asthma   . Seasonal allergies   . Depression   . PTSD (post-traumatic stress disorder)   . Thyroid disease 2009    Graves disease (pt reported resolved); hypothyriodism  . Abnormal pap     pt reports abnl pap many years ago.  Nl since then.  . Palpitations 03/12/2008    Past Surgical History  Procedure Laterality Date  . Dilation and curettage of uterus  March 2006   Family History:  Family History  Problem Relation Age of Onset  . Drug abuse Father   . Depression Maternal Aunt   . Depression Maternal Grandmother   . Anxiety disorder Maternal Grandmother   . COPD Maternal Grandmother   . Suicidality Cousin   . Depression Cousin   . Bipolar disorder Cousin   . Depression Maternal Aunt   . Hypertension Mother   . Depression Mother   . Diabetes Paternal Grandfather   . COPD Paternal Grandmother   . Heart disease Neg Hx    Family Psychiatric  History: Patient reports hx of depression in her grandmother,aunt , bipolar do in her cousin. A cousin committed suicide.  Social History: Patient currently lives with mother in [place],. Pt goes to grad school in [place], is taking time off since she is overwhelmed . Pt also works as a Control and instrumentation engineer .Pt is single  History  Alcohol Use  . 0.6 oz/week  . 1 Glasses of wine per week    Comment: past use of alcohol in '08-'09     History  Drug Use  . Yes  . Special: Marijuana    Comment: past use of marijuana in '08-'09. occasional eats brownies w/ marijuana  before thanksgiving    Social History   Social History  . Marital Status: Single    Spouse Name: N/A  . Number of Children: N/A  . Years of Education: N/A   Social History Main Topics  . Smoking status: Never Smoker   . Smokeless tobacco: Never Used   . Alcohol Use: 0.6 oz/week    1 Glasses of wine per week     Comment: past use of alcohol in '08-'09  . Drug Use: Yes    Special: Marijuana     Comment: past use of marijuana in '08-'09. occasional eats brownies w/ marijuana  before thanksgiving  . Sexual Activity: Not Currently    Birth Control/ Protection: Abstinence   Other Topics Concern  . 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   Additional Social History:   Sleep: Good  Appetite:  Fair  Current Medications: Current Facility-Administered Medications  Medication Dose Route Frequency Provider Last Rate Last Dose  . acetaminophen (TYLENOL) tablet 650 mg  650 mg Oral Q6H PRN Patrecia Pour, NP   650 mg at 10/02/15 0900  . albuterol (PROVENTIL HFA;VENTOLIN HFA) 108 (90 Base) MCG/ACT inhaler 2 puff  2 puff Inhalation Q6H PRN Patrecia Pour, NP      . alum & mag hydroxide-simeth (MAALOX/MYLANTA) 200-200-20 MG/5ML suspension 30 mL  30 mL Oral Q4H PRN Patrecia Pour, NP      . benztropine (COGENTIN) tablet 0.5 mg  0.5 mg Oral BH-[id number] Ursula Alert, MD   0.5 mg at 10/03/15 0746  . budesonide (PULMICORT) nebulizer solution 0.25 mg  0.25 mg Nebulization BID Patrecia Pour, NP   0.25 mg at 10/03/15 0746  . diclofenac (VOLTAREN) EC tablet 75 mg  75 mg Oral BID PRN Ursula Alert, MD      . ferrous sulfate tablet 325 mg  325 mg Oral Q breakfast Patrecia Pour, NP   325 mg at 10/03/15 0746  . levothyroxine (SYNTHROID, LEVOTHROID) tablet 224 mcg  224 mcg Oral QAC breakfast Laverle Hobby, PA-C   224 mcg at 10/03/15 [id number]  . LORazepam (ATIVAN) tablet 1 mg  1 mg Oral Q6H PRN Ursula Alert, MD       Or  . LORazepam (ATIVAN) injection 1 mg  1 mg Intramuscular Q6H PRN Saramma Eappen, MD      . magnesium hydroxide (MILK OF MAGNESIA) suspension 30 mL  30 mL Oral Daily PRN Patrecia Pour, NP      . Oxcarbazepine (TRILEPTAL) tablet 300 mg  300 mg Oral BID  Patrecia Pour, NP   300 mg at 10/03/15 0746  . risperiDONE (RISPERDAL) tablet 0.25 mg  0.25 mg Oral BH-q8a2p Ursula Alert, MD   0.25 mg at 10/03/15 0746  . risperiDONE (RISPERDAL) tablet 0.5 mg  0.5 mg Oral QHS Ursula Alert, MD   0.5 mg at 10/02/15 2111  . traZODone (DESYREL) tablet 100 mg  100 mg Oral QHS Patrecia Pour, NP   100 mg at 10/02/15 2112    Lab Results: No results found for this or  any previous visit (from the past 48 hour(s)).  Blood Alcohol level:  Lab Results  Component Value Date   ETH <5 09/27/2015   ETH <5 08/18/2015   Physical Findings: AIMS: Facial and Oral Movements Muscles of Facial Expression: None, normal Lips and Perioral Area: None, normal Jaw: None, normal Tongue: None, normal,Extremity Movements Upper (arms, wrists, hands, fingers): None, normal Lower (legs, knees, ankles, toes): None, normal, Trunk Movements Neck, shoulders, hips: None, normal, Overall Severity Severity of abnormal movements (highest score from questions above): None, normal Incapacitation due to abnormal movements: None, normal Patient's awareness of abnormal movements (rate only patient's report): No Awareness, Dental Status Current problems with teeth and/or dentures?: No Does patient usually wear dentures?: No  CIWA:    COWS:     Musculoskeletal: Strength & Muscle Tone: within normal limits Gait & Station: normal Patient leans: N/A  Psychiatric Specialty Exam: Review of Systems  Musculoskeletal:       Left leg in boot for LE sprain  Psychiatric/Behavioral: The patient is nervous/anxious.   All other systems reviewed and are negative.   Blood pressure 116/58, pulse 114, temperature 98.4 F (36.9 C), temperature source Oral, resp. rate 16, height 5' 6.5" (1.689 m), weight 106.142 kg (234 lb), SpO2 100 %.Body mass index is 37.21 kg/(m^2).  General Appearance: Casual  Eye Contact::  Fair  Speech:  Normal Rate  Volume:  Normal  Mood:  Anxious  Affect:  Congruent   Thought Process:  Coherent  Orientation:  Full (Time, Place, and Person)  Thought Content:  Paranoid Ideation and Rumination  Suicidal Thoughts:  No is a danger to self or others due to paranoia  Homicidal Thoughts:  No  Memory:  Immediate;   Fair Recent;   Fair Remote;   Fair  Judgement:  Impaired  Insight:  Shallow  Psychomotor Activity:  Restlessness  Concentration:  Fair  Recall:  [employer] of Knowledge:Fair  Language: Fair  Akathisia:  No  Handed:  Right  AIMS (if indicated):     Assets:  Desire for Improvement  ADL's:  Intact  Cognition: WNL  Sleep:  Number of Hours: 6.75   Treatment Plan Summary: Dahiana Ciccarelli is a 35 y.o., single AA female , who is employed as an Building control surveyor , is also in school at [employer] by self in [place] , has a past hx of depression, PTSD as well as Bipolar do, who presented to [facility] for paranoia.Pt continues to be restless, anxious, paranoid. Will continue treatment. Daily contact with patient to assess and evaluate symptoms and progress in treatment and Medication management  Will continue Risperidone 0.25 mg po bid and 0.5 po qhs  for mood sx/paranoia. Will continue Cogentin 0.5 mg po tid for EPS. Will continue Trileptal 300 mg po bid for mood sx. Will continue Trazodone 100 mg po qhs for sleep. Will make available PRN medications for anxiety/agitation.  Will continue to monitor vitals ,medication compliance and treatment side effects while patient is here.  Will monitor for medical issues as well as call consult as needed.  Reviewed labs - EKG for qtc - wnl. CSW will continue working on disposition.  Recreational therapy consult. Patient to participate in therapeutic milieu . Pt will benefit from CBT on an out patient basis.  Encarnacion Slates, NP, PMHNP-BC. 10/03/2015, 2:55 PM Agree with NP Progress Note as above  Neita Garnet, MD

## 2015-10-03 NOTE — BHH Group Notes (Signed)
[facility] LCSW Group Therapy   10/03/2015 2:03 PM  Type of Therapy: Group Therapy  Participation Level:  Active  Participation Quality:  Attentive  Affect:  Flat  Cognitive:  Oriented  Insight:  Limited  Engagement in Therapy:  Engaged  Modes of Intervention:  Discussion and Socialization  Summary of Progress/Problems: Chaplain was here to lead a group on themes of hope and/or courage.   "Before I would pray about certain things and ask for a sign. Here lately I haven't felt a lot of courage. But one thing I am always sure of is my faith."  [name] 10/03/2015 2:03 PM

## 2015-10-03 NOTE — Progress Notes (Signed)
Recreation Therapy Notes  03.24.2017 LRT returned with requested literature. Patient receptive of literature and attempted to participate in progressive muscle relaxation with LRT. Patient reported no benefit, but stated "I'm not really here right now." Patient then expressed concerns about medications destroying her kidney's and liver. LRT encouraged patient to discuss concerns with RN, patient complied and left to seek RN.   Laureen Ochs Majestic Molony, LRT/CTRS   Lane Hacker 10/03/2015 4:03 PM

## 2015-10-03 NOTE — BHH Group Notes (Signed)
[facility] LCSW Aftercare Discharge Planning Group Note   10/03/2015 11:42 AM  Participation Quality: Active    Mood/Affect:  Appropriate  Depression Rating: 0  Anxiety Rating:  5  Thoughts of Suicide:  No Will you contract for safety?   NA  Current AVH:  No  Plan for Discharge/Comments:  Wants to go to "[facility]", moving in w mother  Transportation Means: mother  Supports: mother  Colleen Peck

## 2015-10-03 NOTE — Progress Notes (Signed)
Patient ID: Colleen Peck, female   DOB: 09-18-1980, 35 y.o.   MRN: [id number] D: Client visible on the unit, seen in dayroom talking on the phone. "call to see about my people"  Client reports day been "up and down" reported some bladder incontinence when laughing tonight. "ready to go to bed, tired of some people, can't say who, they are" A: Probation officer encouraged client to continue medications, report any side effects or other concerns. Staff will monitor q48min for safety. R:Client is safe on the unit, attended group.

## 2015-10-04 NOTE — BHH Group Notes (Signed)
[facility] Group Notes:  (Clinical Social Work)  10/04/2015  11:15-12:00PM  Summary of Progress/Problems:   Today's process group involved patients discussing their feelings related to being hospitalized, as well as how they can use their present feelings to create a plan for how to avoid future hospitalizations. The patient expressed her primary feeling about being hospitalized is that she wants to go home, has nothing against the hospital but is overwhelmed and does not think she needs to be here.  She talked about having a hard time trusting strangers, then later said she feels like "case studies" have been done on her without her permission.  When asked to explain, she conveyed the belief that she has been in drug studies without her permission.  After group she insisted on talking to Barron alone, and confronted CSW about working with her in 2012 when she was hospitalized, and laughing at her while performing experiments.  CSW assured her that this was not the case, and she stated with great seriousness, "I forgive you." CSW apologized in turn for being offensive in any way.  Type of Therapy:  Group Therapy - Process  Participation Level:  Active  Participation Quality:  Attentive and Sharing  Affect:  Defensive, Depressed and Flat  Cognitive:  Confused and Delusional  Insight:  Limited  Engagement in Therapy:  Limited  Modes of Intervention:  Exploration, Discussion  Selmer Dominion, LCSW 10/04/2015, 1:27 PM

## 2015-10-04 NOTE — Progress Notes (Signed)
D: Patient pleasant and cooperative with care this shift. Pt does, however, complain of mild anxiety but is visible in the milieu interacting well with peers. Pt participated in group session and was compliant with HS medications. No inappropriate behaviors noted on assessment. A: Q 15 minute safety checks, encourage staff/peer interaction, administer medications as ordered. R: Pt denies SI/HI or plans to harm herself.

## 2015-10-04 NOTE — Progress Notes (Signed)
Adult Psychoeducational Group Note  Date:  10/04/2015 Time:  10:21 PM  Group Topic/Focus:  Wrap-Up Group:   The focus of this group is to help patients review their daily goal of treatment and discuss progress on daily workbooks.  Participation Level:  Active  Participation Quality:  Appropriate  Affect:  Appropriate  Cognitive:  Alert  Insight: Appropriate  Engagement in Group:  Engaged  Modes of Intervention:  Discussion  Additional Comments:  Pt stated that the day started a little bit rocky but got better as the day went on. She also stated that she was sad during visitation time because her family did not come and visit her. Her goal for tomorrow is to be able to go outside.   Wynelle Fanny R 10/04/2015, 10:21 PM

## 2015-10-04 NOTE — Progress Notes (Signed)
Patient ID: Colleen Peck, female   DOB: Oct 02, 1980, 35 y.o.   MRN: [id number] [facility] MD Progress Note  10/04/2015 12:07 PM Colleen Peck  MRN:  [id number]  Subjective: Colleen Peck reports, i am doing better, working on my goals.  Objective:Colleen Peck is a 35 y.o., single AA female, who is employed as an Building control surveyor , is also in school at [employer] ,lives by self in [place], has a past hx of depression, PTSD as well as Bipolar do, who presented to [facility] for paranoia.  Patient seen and chart reviewed.Discussed patient with treatment team.  Patient was alert oriented , states anxiety is better. Mood not hopeles. She says her medications are working. She is attending groups. Says she does not feel depressed except when stressed or feeling overwhelmed. She still presents with some hyper relegiosity.. She currently denies any SIHI or AVH. She continues to need support and encouragement.  Principal Problem: Bipolar disorder, curr episode mixed, severe, with psychotic features ([place]) Diagnosis:   Patient Active Problem List   Diagnosis Date Noted  . Bipolar disorder, curr episode mixed, severe, with psychotic features ([place]) [F31.64] 09/30/2015  . PTSD (post-traumatic stress disorder) [F43.10] 09/30/2015  . Medical clearance for psychiatric admission [Z00.8]   . Paranoia ([place]) [F22]   . Anemia [D64.9] 01/30/2015  . Unspecified vitamin D deficiency [E55.9] 05/22/2013  . Hypothyroidism (acquired) [E03.9] 11/29/2011  . Obesity [E66.9] 09/08/2006  . RHINITIS, ALLERGIC [J30.9] 09/08/2006  . Asthma [J45.909] 09/08/2006  . ECZEMA, ATOPIC DERMATITIS [L20.89] 09/08/2006   Total Time spent with patient: 25 minutes     Past Medical History:  Past Medical History  Diagnosis Date  . Asthma   . Seasonal allergies   . Depression   . PTSD (post-traumatic stress disorder)   . Thyroid disease 2009    Graves disease (pt reported resolved); hypothyriodism  . Abnormal pap     pt reports abnl pap  many years ago.  Nl since then.  . Palpitations 03/12/2008    Past Surgical History  Procedure Laterality Date  . Dilation and curettage of uterus  March 2006   Family History:  Family History  Problem Relation Age of Onset  . Drug abuse Father   . Depression Maternal Aunt   . Depression Maternal Grandmother   . Anxiety disorder Maternal Grandmother   . COPD Maternal Grandmother   . Suicidality Cousin   . Depression Cousin   . Bipolar disorder Cousin   . Depression Maternal Aunt   . Hypertension Mother   . Depression Mother   . Diabetes Paternal Grandfather   . COPD Paternal Grandmother   . Heart disease Neg Hx    Family Psychiatric  History: Patient reports hx of depression in her grandmother,aunt , bipolar do in her cousin. A cousin committed suicide.  Social History: Patient currently lives with mother in [place],. Pt goes to grad school in [place], is taking time off since she is overwhelmed . 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 0.6 oz/week  . 1 Glasses of wine per week    Comment: past use of alcohol in '08-'09     History  Drug Use  . Yes  . Special: Marijuana    Comment: past use of marijuana in '08-'09. occasional eats brownies w/ marijuana  before thanksgiving    Social History   Social History  . Marital Status: Single    Spouse Name: N/A  . Number  of Children: N/A  . Years of Education: N/A   Social History Main Topics  . Smoking status: Never Smoker   . Smokeless tobacco: Never Used  . Alcohol Use: 0.6 oz/week    1 Glasses of wine per week     Comment: past use of alcohol in '08-'09  . Drug Use: Yes    Special: Marijuana     Comment: past use of marijuana in '08-'09. occasional eats brownies w/ marijuana  before thanksgiving  . Sexual Activity: Not Currently    Birth Control/ Protection: Abstinence   Other Topics Concern  . 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   Additional Social History:   Sleep: Good  Appetite:  Fair  Current Medications: Current Facility-Administered Medications  Medication Dose Route Frequency Provider Last Rate Last Dose  . acetaminophen (TYLENOL) tablet 650 mg  650 mg Oral Q6H PRN Patrecia Pour, NP   650 mg at 10/03/15 1535  . albuterol (PROVENTIL HFA;VENTOLIN HFA) 108 (90 Base) MCG/ACT inhaler 2 puff  2 puff Inhalation Q6H PRN Patrecia Pour, NP      . alum & mag hydroxide-simeth (MAALOX/MYLANTA) 200-200-20 MG/5ML suspension 30 mL  30 mL Oral Q4H PRN Patrecia Pour, NP      . benztropine (COGENTIN) tablet 0.5 mg  0.5 mg Oral BH-[id number] Ursula Alert, MD   0.5 mg at 10/04/15 0753  . budesonide (PULMICORT) nebulizer solution 0.25 mg  0.25 mg Nebulization BID Patrecia Pour, NP   0.25 mg at 10/04/15 0753  . diclofenac (VOLTAREN) EC tablet 75 mg  75 mg Oral BID PRN Ursula Alert, MD   75 mg at 10/03/15 1835  . ferrous sulfate tablet 325 mg  325 mg Oral Q breakfast Patrecia Pour, NP   325 mg at 10/04/15 0753  . levothyroxine (SYNTHROID, LEVOTHROID) tablet 224 mcg  224 mcg Oral QAC breakfast Laverle Hobby, PA-C   224 mcg at 10/04/15 [id number]  . LORazepam (ATIVAN) tablet 1 mg  1 mg Oral Q6H PRN Ursula Alert, MD       Or  . LORazepam (ATIVAN) injection 1 mg  1 mg Intramuscular Q6H PRN Saramma Eappen, MD      . magnesium hydroxide (MILK OF MAGNESIA) suspension 30 mL  30 mL Oral Daily PRN Patrecia Pour, NP   30 mL at 10/04/15 [id number]  . Oxcarbazepine (TRILEPTAL) tablet 300 mg  300 mg Oral BID Patrecia Pour, NP   300 mg at 10/04/15 0753  . risperiDONE (RISPERDAL) tablet 0.25 mg  0.25 mg Oral BH-q8a2p Ursula Alert, MD   0.25 mg at 10/04/15 0753  . risperiDONE (RISPERDAL) tablet 0.5 mg  0.5 mg Oral QHS Ursula Alert, MD   0.5 mg at 10/03/15 2145  . traZODone (DESYREL) tablet 100 mg  100 mg Oral QHS Patrecia Pour, NP   100 mg at 10/03/15 2145    Lab Results: No results found for this  or any previous visit (from the past 69 hour(s)).  Blood Alcohol level:  Lab Results  Component Value Date   ETH <5 09/27/2015   ETH <5 08/18/2015   Physical Findings: AIMS: Facial and Oral Movements Muscles of Facial Expression: None, normal Lips and Perioral Area: None, normal Jaw: None, normal Tongue: None, normal,Extremity Movements Upper (arms, wrists, hands, fingers): None, normal Lower (legs, knees, ankles, toes): None, normal, Trunk Movements Neck, shoulders, hips:  None, normal, Overall Severity Severity of abnormal movements (highest score from questions above): None, normal Incapacitation due to abnormal movements: None, normal Patient's awareness of abnormal movements (rate only patient's report): No Awareness, Dental Status Current problems with teeth and/or dentures?: No Does patient usually wear dentures?: No  CIWA:    COWS:     Musculoskeletal: Strength & Muscle Tone: within normal limits Gait & Station: normal Patient leans: N/A  Psychiatric Specialty Exam: Review of Systems  Musculoskeletal:       Left leg in boot for LE sprain  Psychiatric/Behavioral: Negative for suicidal ideas. The patient is nervous/anxious.   All other systems reviewed and are negative.   Blood pressure 129/61, pulse 91, temperature 97.7 F (36.5 C), temperature source Oral, resp. rate 16, height 5' 6.5" (1.689 m), weight 106.142 kg (234 lb), SpO2 100 %.Body mass index is 37.21 kg/(m^2).  General Appearance: Casual  Eye Contact::  Fair  Speech:  Normal Rate  Volume:  Normal  Mood:  Anxious  Affect:  Congruent  Thought Process:  Coherent  Orientation:  Full (Time, Place, and Person)  Thought Content:  Less paranoid  Suicidal Thoughts:  No   Homicidal Thoughts:  No  Memory:  Immediate;   Fair Recent;   Fair Remote;   Fair  Judgement:  Impaired  Insight:  Shallow  Psychomotor Activity:  Restlessness  Concentration:  Fair  Recall:  [employer] of Knowledge:Fair  Language:  Fair  Akathisia:  No  Handed:  Right  AIMS (if indicated):     Assets:  Desire for Improvement  ADL's:  Intact  Cognition: WNL  Sleep:  Number of Hours: 6   Treatment Plan Summary: Marjori Mersinger is a 35 y.o., single AA female , who is employed as an Building control surveyor , is also in school at [employer] by self in [place] , has a past hx of depression, PTSD as well as Bipolar do, who presented to [facility] for paranoia.Pt continues to be restless, anxious, paranoid. Will continue treatment. Daily contact with patient to assess and evaluate symptoms and progress in treatment and Medication management  Will continue Risperidone 0.25 mg po bid and 0.5 po qhs  for mood sx/paranoia. Will continue Cogentin 0.5 mg po tid for EPS. Will continue Trileptal 300 mg po bid for mood sx. Will continue Trazodone 100 mg po qhs for sleep. Will make available PRN medications for anxiety/agitation.  Will continue to monitor vitals ,medication compliance and treatment side effects while patient is here.  Will monitor for medical issues as well as call consult as needed.   CSW will continue working on disposition.  Recreational therapy consult. Patient to participate in therapeutic milieu . Pt will benefit from CBT on an out patient basis.  Merian Capron, MD,  10/04/2015, 12:07 PM

## 2015-10-04 NOTE — Progress Notes (Signed)
D- Patient appears flat and anxious this shift.  Currently denies SI/HI. Patient denies feelings of depression and hopelessness.  Patient rates her anxiety "2" with 10 being the worst.  Patient verbalizes that her goal for today is "self-care, rest, sleep at night, and discharge".  Patient reports that she has been sleeping "good" since she has been here and believes that lack of sleep is what got her admitted to [facility] in the first place.  Patient is focused on her discharge date and would like to know when she would be able to go home.  No complaints.   A- Scheduled medications administered to patient, per MD orders. Support and encouragement provided.  Routine safety checks conducted every 15 minutes.  Patient informed to notify staff with problems or concerns. R- No adverse drug reactions noted. Patient contracts for safety at this time. Patient compliant with medications and treatment plan. Patient receptive, calm, and cooperative. Patient remains safe at this time.

## 2015-10-05 NOTE — Progress Notes (Signed)
D: Colleen Peck has been pleasant and cooperative, showing insight and expressing interest in her care by requesting printed information about her medications. She has been thoughtful in her interactions with staff. She denied SI/HI/AVH. On her self inventory, she reported good sleep, improving appetite, normal energy level and good concentration. She rated depression 2, hopelessness zero, and anxiety 2. Today, she hoped to work on reflecting and relaxing.  A: Meds given as ordered. Q15 safety checks maintained. Support/encouragement offered. R: Pt remains free from harm and continues with treatment. Will continue to monitor for needs/safety.

## 2015-10-05 NOTE — [facility] Group Notes (Signed)
[facility] Group Notes:  (Clinical Social Work)  10/05/2015  11:00AM-12:00PM  Summary of Progress/Problems:  The main focus of today's process group was to listen to a variety of genres of music and to identify that different types of music provoke different responses.  The patient then was able to identify personally what was soothing for them, as well as energizing, as well as how patient can personally use this knowledge in sleep habits, with depression, and with other symptoms.  The patient expressed at the beginning of group the overall feeling of  "pretty good" but she actually appeared irritated.   At the beginning of group she asked CSW if CSW was going to play "culturally appropriate music" and seemed angered by the response that many different types of music would be played to explore emotional responses.  She immediately left the room, then returned and spent most of the group entering and leaving.  Type of Therapy:  Music Therapy   Participation Level:  Minimal  Participation Quality:  Resistant  Affect:  Flat and Irritable  Cognitive:  Unknown  Insight: Poor  Engagement in Therapy:  Limited  Modes of Intervention:   Activity, Exploration  Selmer Dominion, LCSW 10/05/2015

## 2015-10-05 NOTE — Progress Notes (Signed)
Patient ID: Colleen Peck, female   DOB: 06/08/1981, 35 y.o.   MRN: [id number] [facility] MD Progress Note  10/05/2015 11:55 AM Colleen Peck  MRN:  [id number]  Subjective: [name] reports, i am doing better, working on my goals.  Objective:Colleen Peck is a 35 y.o., single AA female, who is employed as an Building control surveyor , is also in school at [employer] ,lives by self in [place], has a past hx of depression, PTSD as well as Bipolar do, who presented to [facility] for paranoia.  Patient seen and chart reviewed.Discussed patient with treatment team.  Patient was alert oriented , states anxiety is better. Mood not hopeles. She says her medications are working. She is attending groups. Says she does not feel depressed except when stressed or feeling overwhelmed.  She did not endorse hyper relegiosity.. She currently denies any SIHI or AVH. She continues to need support and encouragement.  Principal Problem: Bipolar disorder, curr episode mixed, severe, with psychotic features ([place]) Diagnosis:   Patient Active Problem List   Diagnosis Date Noted  . Bipolar disorder, curr episode mixed, severe, with psychotic features ([place]) [F31.64] 09/30/2015  . PTSD (post-traumatic stress disorder) [F43.10] 09/30/2015  . Medical clearance for psychiatric admission [Z00.8]   . Paranoia ([place]) [F22]   . Anemia [D64.9] 01/30/2015  . Unspecified vitamin D deficiency [E55.9] 05/22/2013  . Hypothyroidism (acquired) [E03.9] 11/29/2011  . Obesity [E66.9] 09/08/2006  . RHINITIS, ALLERGIC [J30.9] 09/08/2006  . Asthma [J45.909] 09/08/2006  . ECZEMA, ATOPIC DERMATITIS [L20.89] 09/08/2006   Total Time spent with patient: 25 minutes     Past Medical History:  Past Medical History  Diagnosis Date  . Asthma   . Seasonal allergies   . Depression   . PTSD (post-traumatic stress disorder)   . Thyroid disease 2009    Graves disease (pt reported resolved); hypothyriodism  . Abnormal pap     pt reports abnl pap many  years ago.  Nl since then.  . Palpitations 03/12/2008    Past Surgical History  Procedure Laterality Date  . Dilation and curettage of uterus  March 2006   Family History:  Family History  Problem Relation Age of Onset  . Drug abuse Father   . Depression Maternal Aunt   . Depression Maternal Grandmother   . Anxiety disorder Maternal Grandmother   . COPD Maternal Grandmother   . Suicidality Cousin   . Depression Cousin   . Bipolar disorder Cousin   . Depression Maternal Aunt   . Hypertension Mother   . Depression Mother   . Diabetes Paternal Grandfather   . COPD Paternal Grandmother   . Heart disease Neg Hx    Family Psychiatric  History: Patient reports hx of depression in her grandmother,aunt , bipolar do in her cousin. A cousin committed suicide.  Social History: Patient currently lives with mother in [place],. Pt goes to grad school in [place], is taking time off since she is overwhelmed . 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 0.6 oz/week  . 1 Glasses of wine per week    Comment: past use of alcohol in '08-'09     History  Drug Use  . Yes  . Special: Marijuana    Comment: past use of marijuana in '08-'09. occasional eats brownies w/ marijuana  before thanksgiving    Social History   Social History  . Marital Status: Single    Spouse Name: N/A  . Number  of Children: N/A  . Years of Education: N/A   Social History Main Topics  . Smoking status: Never Smoker   . Smokeless tobacco: Never Used  . Alcohol Use: 0.6 oz/week    1 Glasses of wine per week     Comment: past use of alcohol in '08-'09  . Drug Use: Yes    Special: Marijuana     Comment: past use of marijuana in '08-'09. occasional eats brownies w/ marijuana  before thanksgiving  . Sexual Activity: Not Currently    Birth Control/ Protection: Abstinence   Other Topics Concern  . 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  Not in  a romantic relationship currently.   Additional Social History:   Sleep: Good  Appetite:  Fair  Current Medications: Current Facility-Administered Medications  Medication Dose Route Frequency Provider Last Rate Last Dose  . acetaminophen (TYLENOL) tablet 650 mg  650 mg Oral Q6H PRN Patrecia Pour, NP   650 mg at 10/05/15 1111  . albuterol (PROVENTIL HFA;VENTOLIN HFA) 108 (90 Base) MCG/ACT inhaler 2 puff  2 puff Inhalation Q6H PRN Patrecia Pour, NP      . alum & mag hydroxide-simeth (MAALOX/MYLANTA) 200-200-20 MG/5ML suspension 30 mL  30 mL Oral Q4H PRN Patrecia Pour, NP      . benztropine (COGENTIN) tablet 0.5 mg  0.5 mg Oral BH-q8a2phs Ursula Alert, MD   0.5 mg at 10/05/15 0805  . budesonide (PULMICORT) nebulizer solution 0.25 mg  0.25 mg Nebulization BID Patrecia Pour, NP   0.25 mg at 10/05/15 0856  . diclofenac (VOLTAREN) EC tablet 75 mg  75 mg Oral BID PRN Ursula Alert, MD   75 mg at 10/03/15 1835  . ferrous sulfate tablet 325 mg  325 mg Oral Q breakfast Patrecia Pour, NP   325 mg at 10/05/15 0805  . levothyroxine (SYNTHROID, LEVOTHROID) tablet 224 mcg  224 mcg Oral QAC breakfast Laverle Hobby, PA-C   224 mcg at 10/05/15 0539  . LORazepam (ATIVAN) tablet 1 mg  1 mg Oral Q6H PRN Ursula Alert, MD   1 mg at 10/04/15 2100   Or  . LORazepam (ATIVAN) injection 1 mg  1 mg Intramuscular Q6H PRN Saramma Eappen, MD      . magnesium hydroxide (MILK OF MAGNESIA) suspension 30 mL  30 mL Oral Daily PRN Patrecia Pour, NP   30 mL at 10/04/15 [id number]  . Oxcarbazepine (TRILEPTAL) tablet 300 mg  300 mg Oral BID Patrecia Pour, NP   300 mg at 10/05/15 0805  . risperiDONE (RISPERDAL) tablet 0.25 mg  0.25 mg Oral BH-q8a2p Ursula Alert, MD   0.25 mg at 10/05/15 0805  . risperiDONE (RISPERDAL) tablet 0.5 mg  0.5 mg Oral QHS Saramma Eappen, MD   0.5 mg at 10/04/15 2100  . traZODone (DESYREL) tablet 100 mg  100 mg Oral QHS Patrecia Pour, NP   100 mg at 10/04/15 2100    Lab Results: No results  found for this or any previous visit (from the past 48 hour(s)).  Blood Alcohol level:  Lab Results  Component Value Date   ETH <5 09/27/2015   ETH <5 08/18/2015   Physical Findings: AIMS: Facial and Oral Movements Muscles of Facial Expression: None, normal Lips and Perioral Area: None, normal Jaw: None, normal Tongue: None, normal,Extremity Movements Upper (arms, wrists, hands, fingers): None, normal Lower (legs, knees, ankles, toes): None, normal, Trunk Movements  Neck, shoulders, hips: None, normal, Overall Severity Severity of abnormal movements (highest score from questions above): None, normal Incapacitation due to abnormal movements: None, normal Patient's awareness of abnormal movements (rate only patient's report): No Awareness, Dental Status Current problems with teeth and/or dentures?: No Does patient usually wear dentures?: No  CIWA:    COWS:     Musculoskeletal: Strength & Muscle Tone: within normal limits Gait & Station: normal Patient leans: N/A  Psychiatric Specialty Exam: Review of Systems  Musculoskeletal:       Left leg in boot for LE sprain  Psychiatric/Behavioral: Negative for suicidal ideas. The patient is nervous/anxious.   All other systems reviewed and are negative.   Blood pressure 107/47, pulse 94, temperature 98.9 F (37.2 C), temperature source Oral, resp. rate 16, height 5' 6.5" (1.689 m), weight 106.142 kg (234 lb), SpO2 100 %.Body mass index is 37.21 kg/(m^2).  General Appearance: Casual  Eye Contact::  Fair  Speech:  Normal Rate  Volume:  Normal  Mood:  constricted  Affect:  Congruent  Thought Process:  Coherent  Orientation:  Full (Time, Place, and Person)  Thought Content:  Less paranoid  Suicidal Thoughts:  No   Homicidal Thoughts:  No  Memory:  Immediate;   Fair Recent;   Fair Remote;   Fair  Judgement:  Impaired  Insight:  Shallow  Psychomotor Activity:  Restlessness  Concentration:  Fair  Recall:  [place]: Fair  Akathisia:  No  Handed:  Right  AIMS (if indicated):     Assets:  Desire for Improvement  ADL's:  Intact  Cognition: WNL  Sleep:  Number of Hours: 5.75   Treatment Plan Summary:  Will continue treatment. Some improvement in paranoia.  Daily contact with patient to assess and evaluate symptoms and progress in treatment and Medication management  Will continue Risperidone 0.25 mg po bid and 0.5 po qhs  for mood sx/paranoia. Will continue Cogentin 0.5 mg po tid for EPS. Will continue Trileptal 300 mg po bid for mood sx. Will continue Trazodone 100 mg po qhs for sleep. Will make available PRN medications for anxiety/agitation.  Will continue to monitor vitals ,medication compliance and treatment side effects while patient is here.  Will monitor for medical issues as well as call consult as needed.   CSW will continue working on disposition.  Recreational therapy consult. Patient to participate in therapeutic milieu . Pt will benefit from CBT on an out patient basis.  Merian Capron, MD,  10/05/2015, 11:55 AM

## 2015-10-05 NOTE — Progress Notes (Signed)
Adult Psychoeducational Group Note  Date:  10/05/2015 Time:  8:48 PM  Group Topic/Focus:  Wrap-Up Group:   The focus of this group is to help patients review their daily goal of treatment and discuss progress on daily workbooks.  Participation Level:  Active  Participation Quality:  Appropriate  Affect:  Appropriate  Cognitive:  Appropriate  Insight: Appropriate  Engagement in Group:  Engaged  Modes of Intervention:  Discussion  Additional Comments:  Pt had a good day and stated it was better than yesterday. Pt goal for tomorrow is to focus on self-care, get some rest and work on discharge planning.   Jerline Pain 10/05/2015, 8:48 PM

## 2015-10-05 NOTE — Progress Notes (Signed)
Patient ID: Colleen Peck, female   DOB: 1981-04-16, 35 y.o.   MRN: [id number] D: " I try to shed a tear but I can't that why I know my med is working". Patient reports plans to take the rest of the semester off school to care for self and start up in the fall. Denies  SI/HI/AVH and pain.No behavioral issues noted.  A: Support and encouragement offered as needed. Medications administered as prescribed.  R: Patient cooperative and appropriate on unit. Will continue to monitor patient for safety and stability.

## 2015-10-06 ENCOUNTER — Encounter (HOSPITAL_COMMUNITY): Payer: Self-pay | Admitting: Psychiatry

## 2015-10-06 ENCOUNTER — Other Ambulatory Visit (HOSPITAL_COMMUNITY): Payer: BLUE CROSS/BLUE SHIELD

## 2015-10-06 LAB — LIPID PANEL
CHOL/HDL RATIO: 4.7 ratio
Cholesterol: 150 mg/dL (ref 0–200)
HDL: 32 mg/dL — ABNORMAL LOW (ref >40)
LDL CALC: 80 mg/dL (ref 0–99)
Triglycerides: 192 mg/dL — ABNORMAL HIGH (ref <150)
VLDL: 38 mg/dL (ref 0–40)

## 2015-10-06 MED ORDER — LEVOTHYROXINE SODIUM 112 MCG PO TABS: 224.0000 ug | ORAL_TABLET | Freq: Every day | ORAL | Status: DC

## 2015-10-06 MED ORDER — BENZTROPINE MESYLATE 0.5 MG PO TABS: 0.5000 mg | ORAL_TABLET | ORAL | Status: DC

## 2015-10-06 MED ORDER — RISPERIDONE 0.25 MG PO TABS: ORAL_TABLET | ORAL | Status: DC

## 2015-10-06 MED ORDER — RISPERIDONE MICROSPHERES 25 MG IM SUSR
25.0000 mg | INTRAMUSCULAR | Status: DC
  Administered 2015-10-06: 25 mg via INTRAMUSCULAR
  Filled 2015-10-06: qty 2

## 2015-10-06 MED ORDER — RISPERIDONE MICROSPHERES 25 MG IM SUSR: 25.0000 mg | INTRAMUSCULAR | Status: DC

## 2015-10-06 MED ORDER — DICLOFENAC SODIUM 75 MG PO TBEC: 75.0000 mg | DELAYED_RELEASE_TABLET | Freq: Two times a day (BID) | ORAL | Status: DC | PRN

## 2015-10-06 MED ORDER — RISPERIDONE 0.5 MG PO TABS: 0.5000 mg | ORAL_TABLET | Freq: Every day | ORAL | Status: DC

## 2015-10-06 MED ORDER — OXCARBAZEPINE 300 MG PO TABS: 300.0000 mg | ORAL_TABLET | Freq: Two times a day (BID) | ORAL | Status: DC

## 2015-10-06 MED ORDER — FERROUS SULFATE 325 (65 FE) MG PO TABS: 325.0000 mg | ORAL_TABLET | Freq: Every day | ORAL | Status: DC

## 2015-10-06 NOTE — Discharge Summary (Signed)
Physician Discharge Summary Note  Patient:  Colleen Peck is an 35 y.o., female MRN:  [id number] DOB:  1980/12/19 Patient phone:  [phone] (home)  Patient address:   [place] [address],  Total Time spent with patient: 30 minutes  Date of Admission:  09/29/2015 Date of Discharge: 10/07/2015  Reason for Admission:  paranoia  Principal Problem: Bipolar disorder, curr episode mixed, severe, with psychotic features [facility]) Discharge Diagnoses: Patient Active Problem List   Diagnosis Date Noted  . Bipolar disorder, curr episode mixed, severe, with psychotic features ([place]) [F31.64] 09/30/2015  . PTSD (post-traumatic stress disorder) [F43.10] 09/30/2015  . Medical clearance for psychiatric admission [Z00.8]   . Paranoia ([place]) [F22]   . Anemia [D64.9] 01/30/2015  . Unspecified vitamin D deficiency [E55.9] 05/22/2013  . Hypothyroidism (acquired) [E03.9] 11/29/2011  . Obesity [E66.9] 09/08/2006  . RHINITIS, ALLERGIC [J30.9] 09/08/2006  . Asthma [J45.909] 09/08/2006  . ECZEMA, ATOPIC DERMATITIS [L20.89] 09/08/2006    Past Psychiatric History:  See above noted  Past Medical History:  Past Medical History  Diagnosis Date  . Asthma   . Seasonal allergies   . Depression   . PTSD (post-traumatic stress disorder)   . Thyroid disease 2009    Graves disease (pt reported resolved); hypothyriodism  . Abnormal pap     pt reports abnl pap many years ago.  Nl since then.  . Palpitations 03/12/2008    Past Surgical History  Procedure Laterality Date  . Dilation and curettage of uterus  March 2006   Family History:  Family History  Problem Relation Age of Onset  . Drug abuse Father   . Depression Maternal Aunt   . Depression Maternal Grandmother   . Anxiety disorder Maternal Grandmother   . COPD Maternal Grandmother   . Suicidality Cousin   . Depression Cousin   . Bipolar disorder Cousin   . Depression Maternal Aunt   . Hypertension Mother   . Depression  Mother   . Diabetes Paternal Grandfather   . COPD Paternal Grandmother   . Heart disease Neg Hx    Family Psychiatric  History:  See above noted Social History:  History  Alcohol Use  . 0.6 oz/week  . 1 Glasses of wine per week    Comment: past use of alcohol in '08-'09     History  Drug Use  . Yes  . Special: Marijuana    Comment: past use of marijuana in '08-'09. occasional eats brownies w/ marijuana  before thanksgiving    Social History   Social History  . Marital Status: Single    Spouse Name: N/A  . Number of Children: N/A  . Years of Education: N/A   Social History Main Topics  . Smoking status: Never Smoker   . Smokeless tobacco: Never Used  . Alcohol Use: 0.6 oz/week    1 Glasses of wine per week     Comment: past use of alcohol in '08-'09  . Drug Use: Yes    Special: Marijuana     Comment: past use of marijuana in '08-'09. occasional eats brownies w/ marijuana  before thanksgiving  . Sexual Activity: Not Currently    Birth Control/ Protection: Abstinence   Other Topics Concern  . 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    Hospital Course:  Colleen Peck is a 35 y.o. female with a past hx of depression, PTSD as  well as Bipolar Disorder, who presented to [facility] for paranoia.   Colleen Peck was admitted for Bipolar disorder, curr episode mixed, severe, with psychotic features ([place]) and crisis management.  She was treated with Risperidone 0.25 mg twice during day and 0.5 mg bedtime for mood symptoms/paranoia, Risperdal Consta 25 mg (last dose 3/27 and next dose due is Oct 24, 2015), Cogentin 0.5 mg for EPS, Trileptal 300 mg for mood symptoms, Trazodone 100 mg for sleep and available as needed medications for anxiety/agitation.  Medical problems were identified and treated as needed.  Home medications were restarted as appropriate.  Improvement was monitored by observation and  Colleen Peck daily report of symptom reduction.  Emotional and mental status was monitored by daily self inventory reports completed by Colleen Peck and clinical staff.  Patient reported continued improvement, denied any new concerns.  Patient had been compliant on medications and denied side effects.  She stated anxiety is better and her mood more hopeful.  She says her medications are working. She is attending groups.  She stated that she did not feel depressed except when stressed or feeling overwhelmed.  She did not endorse hyper relegiosity.  She currently denied any SIHI or AVH.  She continued to need support and encouragement.  Support and encouragement was provided.    Patient did well during inpatient stay.  At time of discharge, patient rated both depression and anxiety levels to be manageable and minimal.  Patient was able to identify the triggers of emotional crises and de-stabilizations.  Patient identified the positive things in life that would help in dealing with feelings of loss, depression and unhealthy or abusive tendencies.         Colleen Peck was evaluated by the treatment team for stability and plans for continued recovery upon discharge.  She was offered further treatment options upon discharge including Residential, Intensive Outpatient and Outpatient treatment.  She will follow up with agencies listed below for medication management and counseling.  Encouraged patient to maintain satisfactory support network and home environment.  Advised to adhere to medication compliance and outpatient treatment follow up.      [name] motivation was an integral factor for scheduling further treatment.  Employment, transportation, bed availability, health status, family support, and any pending legal issues were also considered during her hospital stay.  Upon completion of this admission the patient was both mentally and medically stable for discharge denying suicidal/homicidal  ideation, auditory/visual/tactile hallucinations, delusional thoughts and paranoia.      Physical Findings: AIMS: Facial and Oral Movements Muscles of Facial Expression: None, normal Lips and Perioral Area: None, normal Jaw: None, normal Tongue: None, normal,Extremity Movements Upper (arms, wrists, hands, fingers): None, normal Lower (legs, knees, ankles, toes): None, normal, Trunk Movements Neck, shoulders, hips: None, normal, Overall Severity Severity of abnormal movements (highest score from questions above): None, normal Incapacitation due to abnormal movements: None, normal Patient's awareness of abnormal movements (rate only patient's report): No Awareness, Dental Status Current problems with teeth and/or dentures?: No Does patient usually wear dentures?: No  CIWA:    COWS:     Musculoskeletal: Strength & Muscle Tone: within normal limits Gait & Station: normal Patient leans: N/A  Psychiatric Specialty Exam: Review of Systems  All other systems reviewed and are negative.   Blood pressure 112/64, pulse 97, temperature 98.4 F (36.9 C), temperature source Oral, resp. rate 16, height 5' 6.5" (1.689 m), weight 106.142 kg (234 lb), SpO2 100 %.Body mass index is 37.21 kg/(m^2).  Have you used any form of tobacco in the last 30 days? (Cigarettes, Smokeless Tobacco, Cigars, and/or Pipes): No  Has this patient used any form of tobacco in the last 30 days? (Cigarettes, Smokeless Tobacco, Cigars, and/or Pipes) Yes, N/A  Blood Alcohol level:  Lab Results  Component Value Date   [facility] <5 09/27/2015   ETH <5 99991111    Metabolic Disorder Labs:  Lab Results  Component Value Date   HGBA1C 6.1* 08/21/2015   MPG 128 08/21/2015   Lab Results  Component Value Date   PROLACTIN 8.8 10/05/2015   PROLACTIN 27.1* 08/21/2015   Lab Results  Component Value Date   CHOL 150 10/05/2015   TRIG 192* 10/05/2015   HDL 32* 10/05/2015   CHOLHDL 4.7 10/05/2015   VLDL 38 10/05/2015    LDLCALC 80 10/05/2015   LDLCALC 122* 08/21/2015    See Psychiatric Specialty Exam and Suicide Risk Assessment completed by Attending Physician prior to discharge.  Discharge destination:  Home  Is patient on multiple antipsychotic therapies at discharge:  No   Has Patient had three or more failed trials of antipsychotic monotherapy by history:  No  Recommended Plan for Multiple Antipsychotic Therapies: NA     Medication List    STOP taking these medications        albuterol 108 (90 Base) MCG/ACT inhaler  Commonly known as:  PROVENTIL HFA     ARIPiprazole 30 MG tablet  Commonly known as:  ABILIFY     beclomethasone 40 MCG/ACT inhaler  Commonly known as:  QVAR     ENSURE PLUS Liqd     FLUoxetine 20 MG capsule  Commonly known as:  PROZAC     HYDROcodone-acetaminophen 5-325 MG tablet  Commonly known as:  NORCO/VICODIN     LORazepam 0.5 MG tablet  Commonly known as:  ATIVAN     MELATONIN PO     naproxen 375 MG tablet  Commonly known as:  NAPROSYN     traZODone 100 MG tablet  Commonly known as:  DESYREL      TAKE these medications      Indication   benztropine 0.5 MG tablet  Commonly known as:  COGENTIN  Take 1 tablet (0.5 mg total) by mouth 3 (three) times daily at 8am, 2pm and bedtime.   Indication:  Extrapyramidal Reaction caused by Medications     diclofenac 75 MG EC tablet  Commonly known as:  VOLTAREN  Take 1 tablet (75 mg total) by mouth 2 (two) times daily as needed for mild pain or moderate pain.   Indication:  pain     ferrous sulfate 325 (65 FE) MG tablet  Take 1 tablet (325 mg total) by mouth daily with breakfast.   Indication:  Anemia From Inadequate Iron in the Body     levothyroxine 112 MCG tablet  Commonly known as:  SYNTHROID, LEVOTHROID  Take 2 tablets (224 mcg total) by mouth daily before breakfast.   Indication:  Underactive Thyroid     Oxcarbazepine 300 MG tablet  Commonly known as:  TRILEPTAL  Take 1 tablet (300 mg total) by  mouth 2 (two) times daily.   Indication:  Manic-Depression     risperiDONE 0.25 MG tablet  Commonly known as:  RISPERDAL  Take 1 tab (0.25 mg) at 8 am and 2 pm.  Then take 2 tabs (0.50 mg) at bedtime.   Indication:  mood stabilization     risperiDONE microspheres 25 MG injection  Commonly known as:  RISPERDAL  CONSTA  Inject 2 mLs (25 mg total) into the muscle every 14 (fourteen) days. Last dose given Oct 06, 2015.  Next dose due Apr 14.   Indication:  mood stabilization       Follow-up Information    Follow up with [facility] On 10/07/2015.   Why:  Tuesday at 10:00 with [facility] for therapy.  They will schedule you with Dr Einar Grad at that time.   Contact information:   [place] Phone [[phone]       Follow-up recommendations:  Activity:  as tol Diet:  as tol  Comments:  1.  Take all your medications as prescribed.   2.  Report any adverse side effects to outpatient provider. 3.  Patient instructed to not use alcohol or illegal drugs while on prescription medicines. 4.  In the event of worsening symptoms, instructed patient to call 911, the crisis hotline or go to nearest emergency room for evaluation of symptoms.  Signed: Janett Labella, NP [facility] 10/07/2015, 1:14 PM

## 2015-10-06 NOTE — BHH Suicide Risk Assessment (Signed)
[facility] Discharge Suicide Risk Assessment   Principal Problem: Bipolar disorder, curr episode mixed, severe, with psychotic features ([place]) ( acute phase resolved)  Discharge Diagnoses:  Patient Active Problem List   Diagnosis Date Noted  . Bipolar disorder, curr episode mixed, severe, with psychotic features ([place]) [F31.64] 09/30/2015  . PTSD (post-traumatic stress disorder) [F43.10] 09/30/2015  . Medical clearance for psychiatric admission [Z00.8]   . Paranoia ([place]) [F22]   . Anemia [D64.9] 01/30/2015  . Unspecified vitamin D deficiency [E55.9] 05/22/2013  . Hypothyroidism (acquired) [E03.9] 11/29/2011  . Obesity [E66.9] 09/08/2006  . RHINITIS, ALLERGIC [J30.9] 09/08/2006  . Asthma [J45.909] 09/08/2006  . ECZEMA, ATOPIC DERMATITIS [L20.89] 09/08/2006    Total Time spent with patient: 30 minutes  Musculoskeletal: Strength & Muscle Tone: within normal limits Gait & Station: normal Patient leans: N/A  Psychiatric Specialty Exam: Review of Systems  Psychiatric/Behavioral: Negative for depression and hallucinations. The patient is not nervous/anxious.   All other systems reviewed and are negative.   Blood pressure 112/64, pulse 97, temperature 98.4 F (36.9 C), temperature source Oral, resp. rate 16, height 5' 6.5" (1.689 m), weight 106.142 kg (234 lb), SpO2 100 %.Body mass index is 37.21 kg/(m^2).  General Appearance: Casual  Eye Contact::  Fair  Speech:  Clear and [id number]  Volume:  Normal  Mood:  Euthymic  Affect:  Appropriate  Thought Process:  Coherent  Orientation:  Full (Time, Place, and Person)  Thought Content:  WDL  Suicidal Thoughts:  No  Homicidal Thoughts:  No  Memory:  Immediate;   Fair Recent;   Fair Remote;   Fair  Judgement:  Fair  Insight:  Fair  Psychomotor Activity:  Normal  Concentration:  Fair  Recall:  [employer] of Knowledge:Fair  Language: Fair  Akathisia:  No  Handed:  Right  AIMS (if indicated):     Assets:  Desire for Improvement   Sleep:  Number of Hours: 6.25  Cognition: WNL  ADL's:  Intact   Mental Status Per Nursing Assessment::   On Admission:     Demographic Factors:  NA  Loss Factors: Decrease in vocational status  Historical Factors: Impulsivity  Risk Reduction Factors:   Positive social support  Continued Clinical Symptoms:  Previous Psychiatric Diagnoses and Treatments  Cognitive Features That Contribute To Risk:  None    Suicide Risk:  Minimal: No identifiable suicidal ideation.  Patients presenting with no risk factors but with morbid ruminations; may be classified as minimal risk based on the severity of the depressive symptoms  Follow-up Information    Follow up with [facility] On 10/07/2015.   Why:  Tuesday at 10:00 with [facility] for therapy.  They will schedule you with Dr Einar Grad at that time.   Contact information:   [place] Phone [336] [phone]       Plan Of Care/Follow-up recommendations:  Activity:  no restrictions Diet:  regular Tests:  as needed Other:  Risperdal consta 25 mg IM x first dose today (10/06/15) repeat every 14 days.  Janna Oak, MD 10/06/2015, 9:26 AM

## 2015-10-06 NOTE — BHH Suicide Risk Assessment (Signed)
[place] INPATIENT:  Family/Significant Other Suicide Prevention Education  Suicide Prevention Education:  Education Completed; No one has been identified by the patient as the family member/significant other with whom the patient will be residing, and identified as the person(s) who will aid the patient in the event of a mental health crisis (suicidal ideations/suicide attempt).  With written consent from the patient, the family member/significant other has been provided the following suicide prevention education, prior to the and/or following the discharge of the patient.  The suicide prevention education provided includes the following:  Suicide risk factors  Suicide prevention and interventions  National Suicide Hotline telephone number  [facility] assessment telephone number  [facility] Emergency Assistance [place] and/or Residential Mobile Crisis Unit telephone number  Request made of family/significant other to:  Remove weapons (e.g., guns, rifles, knives), all items previously/currently identified as safety concern.    Remove drugs/medications (over-the-counter, prescriptions, illicit drugs), all items previously/currently identified as a safety concern.  The family member/significant other verbalizes understanding of the suicide prevention education information provided.  The family member/significant other agrees to remove the items of safety concern listed above. The patient did not endorse SI at the time of admission, nor did the patient c/o SI during the stay here.  SPE not required.   Colleen Peck 10/06/2015, 10:18 AM

## 2015-10-06 NOTE — Progress Notes (Signed)
  [facility] Adult Case Management Discharge Plan :  Will you be returning to the same living situation after discharge:  Yes,  home with mother At discharge, do you have transportation home?: Yes,  mother Do you have the ability to pay for your medications: Yes,  insurancwe  Release of information consent forms completed and in the chart;  Patient's signature needed at discharge.  Patient to Follow up at: Follow-up Information    Follow up with [facility] On 10/07/2015.   Why:  Tuesday at 10:00 with [facility] for therapy.  They will schedule you with Dr Einar Grad at that time.   Contact information:   [place] Phone [336] [phone]       Next level of care provider has access to [place] and Suicide Prevention discussed: Yes,  yes  Have you used any form of tobacco in the last 30 days? (Cigarettes, Smokeless Tobacco, Cigars, and/or Pipes): No  Has patient been referred to the Quitline?: N/A patient is not a smoker  Patient has been referred for addiction treatment: N/A  Trish Mage 10/06/2015, 10:25 AM

## 2015-10-06 NOTE — Tx Team (Signed)
Interdisciplinary Treatment Plan Update (Adult)  Date:  10/06/2015   Time Reviewed:  8:21 AM   Progress in Treatment: Attending groups: Yes. Participating in groups:  Minimally Taking medication as prescribed:  Yes. Tolerating medication:  Yes. Family/Significant other contact made:  Yes Patient understands diagnosis:  Yes  As evidenced by seeking help with "my paranoia and racing thoughts" Discussing patient identified problems/goals with staff:  Yes, see initial care plan. Medical problems stabilized or resolved:  Yes. Denies suicidal/homicidal ideation: Yes. Issues/concerns per patient self-inventory:  No. Other:  New problem(s) identified:  Discharge Plan or Barriers: see below  Reason for Continuation of Hospitalization:   Comments: Colleen Peck is a 35 y.o. single AA female who has a past hx of bipolar d/o and PTSD. Colleen Peck presents with worsening anxiety/racing thoughts and paranoia . Will start a trial of Risperidone 0.25 mg po tid for mood sx/paranoia. Will add Cogentin 0.5 mg po tid for EPS. Will continue Trileptal 300 mg po bid for mood sx. Will continue Trazodone 100 mg po qhs for sleep. Will make available PRN medications for anxiety/agitation.  Estimated length of stay: D/C today  New goal(s):  Review of initial/current patient goals per problem list:   Review of initial/current patient goals per problem list:  1. Goal(s): Patient will participate in aftercare plan   Met: Yes   Target date: 3-5 days post admission date   As evidenced by: Patient will participate within aftercare plan AEB aftercare provider and housing plan at discharge being identified. 10/01/15:  Return home, follow up outpt     3. Goal(s): Patient will demonstrate decreased signs and symptoms of anxiety.   Met: Yes   Target date: 3-5 days post admission date   As evidenced by: Patient will utilize self rating of anxiety at 3 or below and demonstrated decreased signs of  anxiety, or be deemed stable for discharge by MD 10/01/15:  Rates anxiety a 4 today 10/06/15:  Pt denies anxiety today       5. Goal(s): Patient will demonstrate decreased signs of psychosis  * Met: Yes  * Target date: 3-5 days post admission date  * As evidenced by: Patient will demonstrate decreased frequency of AVH or return to baseline function 10/01/15:  Pt admits to on-going paranoia 10/06/15:  Pt denies all symptoms today           Attendees: Patient:  10/06/2015 8:21 AM   Family:   10/06/2015 8:21 AM   Physician:  Ursula Alert, MD 10/06/2015 8:21 AM   Nursing:   Darrol Angel, RN 10/06/2015 8:21 AM   CSW:    Roque Lias, LCSW   10/06/2015 8:21 AM   Other:  10/06/2015 8:21 AM   Other:   10/06/2015 8:21 AM   Other:  Lars Pinks, Nurse CM 10/06/2015 8:21 AM   Other:   10/06/2015 8:21 AM   Other:  Norberto Sorenson, [place]  10/06/2015 8:21 AM   Other:  10/06/2015 8:21 AM   Other:  10/06/2015 8:21 AM   Other:  10/06/2015 8:21 AM   Other:  10/06/2015 8:21 AM   Other:  10/06/2015 8:21 AM   Other:   10/06/2015 8:21 AM    Scribe for Treatment Team:   Trish Mage, 10/06/2015 8:21 AM

## 2015-10-06 NOTE — Progress Notes (Signed)
Patient discharged home with samples. Patient was stable and appreciative at that time. All papers and prescriptions were given and valuables returned. Verbal understanding expressed. Denies SI/HI and A/VH. Patient given opportunity to express concerns and ask questions.

## 2015-10-07 ENCOUNTER — Other Ambulatory Visit (HOSPITAL_COMMUNITY): Payer: BLUE CROSS/BLUE SHIELD

## 2015-10-07 ENCOUNTER — Ambulatory Visit (INDEPENDENT_AMBULATORY_CARE_PROVIDER_SITE_OTHER): Payer: BLUE CROSS/BLUE SHIELD | Admitting: Licensed Clinical Social Worker

## 2015-10-07 DIAGNOSIS — F3160 Bipolar disorder, current episode mixed, unspecified: Secondary | ICD-10-CM

## 2015-10-07 LAB — PROLACTIN: Prolactin: 8.8 ng/mL (ref 4.8–23.3)
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
## 2015-10-08 ENCOUNTER — Ambulatory Visit (INDEPENDENT_AMBULATORY_CARE_PROVIDER_SITE_OTHER): Payer: BLUE CROSS/BLUE SHIELD | Admitting: Psychiatry

## 2015-10-08 ENCOUNTER — Other Ambulatory Visit (HOSPITAL_COMMUNITY): Payer: BLUE CROSS/BLUE SHIELD

## 2015-10-08 ENCOUNTER — Encounter: Payer: Self-pay | Admitting: Psychiatry

## 2015-10-08 VITALS — BP 122/74 | HR 95 | Temp 98.1°F

## 2015-10-08 DIAGNOSIS — F411 Generalized anxiety disorder: Secondary | ICD-10-CM

## 2015-10-08 DIAGNOSIS — F3160 Bipolar disorder, current episode mixed, unspecified: Secondary | ICD-10-CM

## 2015-10-08 NOTE — Progress Notes (Signed)
[place] MD/PA/NP OP Progress Note  10/08/2015 9:57 AM Colleen Peck  MRN:  [id number]  Chief Complaint: Overwhelmed Chief Complaint    Follow-up; Medication Refill      HPI: Patient is a 35 yo AAF with Anxiety, PTSD, probably Bipolar disorder. Patient reports that she is very overwhelmed with her situation. She was recently hospitalized at [facility] and discharged on March 23. States that she feels the hospitalization did not help her with any of her symptoms. States that she does not have any psychotic symptoms and she is been given a diagnosis of psychosis. States that she is very overwhelmed with going to school and working and is not doing any right now. States that she is sleeping better. Eating okay. She is concerned about all the medicine she is supposed to take and wonders if she needs all of it. States she is having trouble with making decisions. She denies having any auditory or visual hallucinations. She denies being depressed. She denies any suicidal thoughts. She is going to be moving with her family soon. 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  she is currently not enrolled in the IOP program at [facility]. States that she was discharged from that that clinic and told not to come back. She states that this is because she did not share in the group and she is a private person and would do better with the therapist one-on-one.   Visit Diagnosis: No diagnosis found.  Past Psychiatric History: Patient has a history of PTSD and anxiety and the multiple hospitalizations more so in the last 6 weeks she has had 2 hospitalizations. She reports being very overwhelmed.  Past Medical History:  Past Medical History  Diagnosis Date  . Asthma   . Seasonal allergies   . Depression   . PTSD (post-traumatic stress disorder)   . Thyroid disease 2009    Graves disease (pt reported resolved); hypothyriodism  . Abnormal pap     pt  reports abnl pap many years ago.  Nl since then.  . Palpitations 03/12/2008    Past Surgical History  Procedure Laterality Date  . Dilation and curettage of uterus  March 2006    Family Psychiatric History:   Family History:  Family History  Problem Relation Age of Onset  . Drug abuse Father   . Depression Maternal Aunt   . Depression Maternal Grandmother   . Anxiety disorder Maternal Grandmother   . COPD Maternal Grandmother   . Suicidality Cousin   . Depression Cousin   . Bipolar disorder Cousin   . Depression Maternal Aunt   . Hypertension Mother   . Depression Mother   . Diabetes Paternal Grandfather   . COPD Paternal Grandmother   . Heart disease Neg Hx     Social History:  Social History   Social History  . Marital Status: Single    Spouse Name: N/A  . Number of Children: N/A  . Years of Education: N/A   Social History Main Topics  . Smoking status: Never Smoker   . Smokeless tobacco: Never Used  . Alcohol Use: No     Comment: past use of alcohol in '08-'09  . Drug Use: No     Comment: past use of marijuana in '08-'09. occasional eats brownies w/ marijuana  before thanksgiving  . Sexual Activity: Not Currently    Birth Control/ Protection: Abstinence   Other Topics Concern  . 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    Allergies:  Allergies  Allergen Reactions  . Abilify [Aripiprazole] Other (See Comments)    AKATHISIA    Metabolic Disorder Labs: Lab Results  Component Value Date   HGBA1C 6.1* 08/21/2015   MPG 128 08/21/2015   Lab Results  Component Value Date   PROLACTIN 8.8 10/05/2015   PROLACTIN 27.1* 08/21/2015   Lab Results  Component Value Date   CHOL 150 10/05/2015   TRIG 192* 10/05/2015   HDL 32* 10/05/2015   CHOLHDL 4.7 10/05/2015   VLDL 38 10/05/2015   LDLCALC 80 10/05/2015   LDLCALC 122* 08/21/2015     Current Medications: Current  Outpatient Prescriptions  Medication Sig Dispense Refill  . benztropine (COGENTIN) 0.5 MG tablet Take 1 tablet (0.5 mg total) by mouth 3 (three) times daily at 8am, 2pm and bedtime. 90 tablet 0  . diclofenac (VOLTAREN) 75 MG EC tablet Take 1 tablet (75 mg total) by mouth 2 (two) times daily as needed for mild pain or moderate pain. 14 tablet 0  . ferrous sulfate 325 (65 FE) MG tablet Take 1 tablet (325 mg total) by mouth daily with breakfast. 30 tablet 0  . levothyroxine (SYNTHROID, LEVOTHROID) 112 MCG tablet Take 2 tablets (224 mcg total) by mouth daily before breakfast. 60 tablet 0  . Oxcarbazepine (TRILEPTAL) 300 MG tablet Take 1 tablet (300 mg total) by mouth 2 (two) times daily. 60 tablet 0  . risperiDONE (RISPERDAL) 0.25 MG tablet Take 1 tab (0.25 mg) at 8 am and 2 pm.  Then take 2 tabs (0.50 mg) at bedtime. 160 tablet 0  . risperiDONE microspheres (RISPERDAL CONSTA) 25 MG injection Inject 2 mLs (25 mg total) into the muscle every 14 (fourteen) days. Last dose given Oct 06, 2015.  Next dose due Apr 14. 1 each 0   No current facility-administered medications for this visit.    Neurologic: Headache: No Seizure: No Paresthesias: No  Musculoskeletal: Strength & Muscle Tone: within normal limits Gait & Station: normal Patient leans: N/A  Psychiatric Specialty Exam: ROS  Blood pressure 122/74, pulse 95, temperature 98.1 F (36.7 C), temperature source Tympanic, last menstrual period 08/31/2015, SpO2 94 %.There is no weight on file to calculate BMI.  General Appearance: Casual  Eye Contact:  Fair  Speech:  Clear and Coherent  Volume:  Normal  Mood:  Anxious  Affect:  Constricted and Flat  Thought Process:  Coherent  Orientation:  Full (Time, Place, and Person)  Thought Content:  WDL  Suicidal Thoughts:  No  Homicidal Thoughts:  No  Memory:  Immediate;   Fair Recent;   Fair Remote;   Fair  Judgement:  Fair  Insight:  Fair  Psychomotor Activity:  Normal  Concentration:  Fair   Recall:  [employer] of Knowledge: Fair  Language: Fair  Akathisia:  No  Handed:  Right  AIMS (if indicated):    Assets:  Communication Skills Desire for Improvement Resilience Social Support Vocational/Educational  ADL's:  Intact  Cognition: WNL  Sleep:  good     Treatment Plan Summary:Medication management Anxiety She is to continue on her current medications of Trileptal and Risperdal as per the inpatient directions. I will reassess the need for these medications in 1 month's time. Patient to start therapy with our therapist here and we'll collaborate with therapist on patient's inability to go back to work full-time. Currently recommend  that patient go back to work part-time as an Building control surveyor  PTSD Start therapy to address her trauma  Probable bipolar Continue to monitor  Return to clinic in 1 month's time or call before if necessary  Elvin So, MD 10/08/2015, 9:57 AM

## 2015-10-10 ENCOUNTER — Encounter: Payer: Self-pay | Admitting: Family Medicine

## 2015-10-10 ENCOUNTER — Ambulatory Visit (INDEPENDENT_AMBULATORY_CARE_PROVIDER_SITE_OTHER): Payer: BLUE CROSS/BLUE SHIELD | Admitting: Family Medicine

## 2015-10-10 VITALS — BP 111/69 | HR 70 | Temp 98.2°F

## 2015-10-10 DIAGNOSIS — K5909 Other constipation: Secondary | ICD-10-CM | POA: Diagnosis not present

## 2015-10-10 DIAGNOSIS — R1011 Right upper quadrant pain: Secondary | ICD-10-CM | POA: Diagnosis not present

## 2015-10-10 DIAGNOSIS — S8992XS Unspecified injury of left lower leg, sequela: Secondary | ICD-10-CM

## 2015-10-10 MED ORDER — POLYETHYLENE GLYCOL 3350 17 GM/SCOOP PO POWD: 17.0000 g | Freq: Two times a day (BID) | ORAL | Status: DC | PRN

## 2015-10-10 NOTE — Patient Instructions (Addendum)
Please schedule an appointment with Sports Medicine for rib pain evaluation.  Thank you for coming in to clinic today.  1. Your symptoms are consistent with Constipation, likely cause of your General Abdominal Pain / Cramping. 2. Start with Miralax sent to pharmacy. First dose 68g (4 capfuls) in 32oz water over 1 to 2 hours for clean out. Next day start 17g or 1 capful daily, may adjust dose up or down by half a capful every few days. Recommend to take this medicine daily for next 1-2 weeks, then may need to use it longer if needed. - Goal is to have soft regular bowel movement 1-3x daily, if too runny or diarrhea, then reduce dose of the medicine  Improve water intake, hydration will help Also recommend increased vegetables, fruits, fiber intake Can try daily Metamucil or Fiber supplement at pharmacy over the counter  Follow-up if symptoms are not improving with bowel movements, or if pain worsens, develop fevers, nausea, vomiting.  Please schedule a follow-up appointment with Dr Lajuana Ripple in 1 month to follow-up Constipation  If you have any other questions or concerns, please feel free to call the clinic to contact me. You may also schedule an earlier appointment if necessary.  However, if your symptoms get significantly worse, please go to the Emergency Department to seek immediate medical attention.

## 2015-10-10 NOTE — Progress Notes (Addendum)
   Subjective: CC:LLE pain [id number] Colleen Peck is a 35 y.o. female presenting to clinic today for same day appointment. PCP: Ronnie Doss, DO Concerns today include:  Damaged muscle in LLE: Went to ortho at [facility].  She was told she could put some weight on the left leg.  She notes that she is not able to tolerate the crutches.  She reports that accident happened 3 weeks ago.  Taking Diclofenac.  She was placed in a boot.  She is tolerating this well.  RUQ pain Patient notes that when she eats certain food her side hurts. She notes that when she is stressed pain is worse.  She reports that her menstrual cycles have returned to normal this month.  Not necessarily worse with movements.  Does not recall an injury.  Pain is a throbbing/ stabbing pain that is intermittent.  Occ has constipation and she notes that pain is worse at that time as well.  No blood in stool.  Does not have BM daily.  Required MoM at [facility] to stool.  Social History Reviewed: non smoker. FamHx and MedHx reviewed.  Please see EMR. Health Maintenance: Declines flu shot  ROS: Per HPI  Objective: Office vital signs reviewed. BP 111/69 mmHg  Pulse 70  Temp(Src) 98.2 F (36.8 C) (Oral)  Wt   LMP 09/28/2015 (Exact Date)  Physical Examination:  General: Awake, alert, obese, No acute distress HEENT: Normal, MMM GI: obese, soft, non-tender, non-distended, bowel sounds present x4, no hepatomegaly, no splenomegaly Extremities: warm, well perfused, boot on Left LE MSK: no rib TTP , no palpable rib abnormalities.   Psych: mood stable, speech normal.  Thought process normal.  Assessment/ Plan: 35 y.o. female   1. Other constipation.  Could be the etiology of her RUQ pain.  On Iron, so has issues with constipation.  CBC reviewed.  Hgb improving by almost 1 full point since last check. - Clean out instructions provided - Encourage fiber intake and exercise - polyethylene glycol powder (GLYCOLAX/MIRALAX) powder; Take 17  g by mouth 2 (two) times daily as needed.  Dispense: 3350 g; Refill: 1 - Follow up in 4 weeks  2. RUQ pain.  Having normal periods.  Notes constipation. Pain seems to be related to stress.  Though she does note occ association with some foods.  Ultrasound did not reveal gallbladder or hepatic pathology.  I wonder if she is having psychosomatic pain.  09/27/15 labs reviewed.  LFTs normal. - Consider gabapentin vs CT scan if no improvement - Discussed considering visit with [facility] for evaluation  3. Injury, lower leg, left, sequela - Continue Voltaren as directed - Recommend follow up with Ortho as scheduled to transition out of boot. - 2 month Handicap placard form provided at this appt  Janora Norlander, DO PGY-2, [place]

## 2015-10-16 ENCOUNTER — Ambulatory Visit (INDEPENDENT_AMBULATORY_CARE_PROVIDER_SITE_OTHER): Payer: BLUE CROSS/BLUE SHIELD | Admitting: Licensed Clinical Social Worker

## 2015-10-16 ENCOUNTER — Ambulatory Visit (INDEPENDENT_AMBULATORY_CARE_PROVIDER_SITE_OTHER): Payer: BLUE CROSS/BLUE SHIELD | Admitting: Psychiatry

## 2015-10-16 ENCOUNTER — Encounter: Payer: Self-pay | Admitting: Psychiatry

## 2015-10-16 DIAGNOSIS — F3160 Bipolar disorder, current episode mixed, unspecified: Secondary | ICD-10-CM | POA: Diagnosis not present

## 2015-10-16 MED ORDER — BENZTROPINE MESYLATE 0.5 MG PO TABS: 0.5000 mg | ORAL_TABLET | Freq: Two times a day (BID) | ORAL | Status: DC

## 2015-10-16 MED ORDER — RISPERIDONE 0.5 MG PO TABS: 0.5000 mg | ORAL_TABLET | Freq: Two times a day (BID) | ORAL | Status: DC

## 2015-10-16 MED ORDER — OXCARBAZEPINE 300 MG PO TABS: 300.0000 mg | ORAL_TABLET | Freq: Two times a day (BID) | ORAL | Status: DC

## 2015-10-16 NOTE — Progress Notes (Signed)
Patient ID: Colleen Peck, female   DOB: [birth year], 35 y.o.   MRN: [id number] [facility] MD/PA/NP OP Progress Note  10/16/2015 1:45 PM Colleen Peck  MRN:  [id number]  Chief Complaint: Overwhelmed Chief Complaint    Follow-up; Medication Refill      HPI: Patient is a 35 yo AAF with Anxiety, PTSD, probably Bipolar disorder. Patient was seen today for medication refill. Patient presents much better than her previous 2 visits. However she states that she was doing fine the last time also. Patient continuously asks this clinician why she is been given a diagnosis of psychosis. Discussed with her that she had symptoms of paranoia and bipolar-like symptoms and that's when she was given the diagnosis. However patient continues to state she is not paranoid and has not heard or seen things. She wonders if she needs to take all her current medications. Discussed with her that we will try to taper down some of her medications since she is no longer in the hospital. Patient is agreeable to this and would like to lower the dosages of her medications. States she is been sleeping okay and eating okay. For now she is staying with her mother.  Visit Diagnosis: No diagnosis found.  Past Psychiatric History: Patient has a history of PTSD and anxiety and the multiple hospitalizations more so in the last 6 weeks she has had 2 hospitalizations. She reports being very overwhelmed.  Past Medical History:  Past Medical History  Diagnosis Date  . Asthma   . Seasonal allergies   . Depression   . PTSD (post-traumatic stress disorder)   . Thyroid disease 2009    Graves disease (pt reported resolved); hypothyriodism  . Abnormal pap     pt reports abnl pap many years ago.  Nl since then.  . Palpitations 03/12/2008  . Bipolar disorder [facility])     Past Surgical History  Procedure Laterality Date  . Dilation and curettage of uterus  March 2006    Family Psychiatric History:   Family History:  Family History  Problem  Relation Age of Onset  . Drug abuse Father   . Depression Maternal Aunt   . Depression Maternal Grandmother   . Anxiety disorder Maternal Grandmother   . COPD Maternal Grandmother   . Suicidality Cousin   . Depression Cousin   . Bipolar disorder Cousin   . Depression Maternal Aunt   . Hypertension Mother   . Depression Mother   . Diabetes Paternal Grandfather   . COPD Paternal Grandmother   . Heart disease Neg Hx     Social History:  Social History   Social History  . Marital Status: Single    Spouse Name: N/A  . Number of Children: N/A  . Years of Education: N/A   Social History Main Topics  . Smoking status: Never Smoker   . Smokeless tobacco: Never Used  . Alcohol Use: No     Comment: past use of alcohol in '08-'09  . Drug Use: No     Comment: past use of marijuana in '08-'09. occasional eats brownies w/ marijuana  before thanksgiving  . Sexual Activity: Not Currently    Birth Control/ Protection: Abstinence   Other Topics Concern  . 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    Allergies:  Allergies  Allergen Reactions  . Abilify [Aripiprazole] Other (See Comments)    AKATHISIA    Metabolic Disorder  Labs: Lab Results  Component Value Date   HGBA1C 6.1* 08/21/2015   MPG 128 08/21/2015   Lab Results  Component Value Date   PROLACTIN 8.8 10/05/2015   PROLACTIN 27.1* 08/21/2015   Lab Results  Component Value Date   CHOL 150 10/05/2015   TRIG 192* 10/05/2015   HDL 32* 10/05/2015   CHOLHDL 4.7 10/05/2015   VLDL 38 10/05/2015   LDLCALC 80 10/05/2015   LDLCALC 122* 08/21/2015     Current Medications: Current Outpatient Prescriptions  Medication Sig Dispense Refill  . benztropine (COGENTIN) 0.5 MG tablet Take 1 tablet (0.5 mg total) by mouth 3 (three) times daily at 8am, 2pm and bedtime. 90 tablet 0  . diclofenac (VOLTAREN) 75 MG EC tablet Take 1 tablet (75 mg total)  by mouth 2 (two) times daily as needed for mild pain or moderate pain. 14 tablet 0  . ferrous sulfate 325 (65 FE) MG tablet Take 1 tablet (325 mg total) by mouth daily with breakfast. 30 tablet 0  . levothyroxine (SYNTHROID, LEVOTHROID) 112 MCG tablet Take 2 tablets (224 mcg total) by mouth daily before breakfast. 60 tablet 0  . Oxcarbazepine (TRILEPTAL) 300 MG tablet Take 1 tablet (300 mg total) by mouth 2 (two) times daily. 60 tablet 0  . polyethylene glycol powder (GLYCOLAX/MIRALAX) powder Take 17 g by mouth 2 (two) times daily as needed. 3350 g 1  . risperiDONE (RISPERDAL) 0.25 MG tablet Take 1 tab (0.25 mg) at 8 am and 2 pm.  Then take 2 tabs (0.50 mg) at bedtime. 160 tablet 0  . risperiDONE microspheres (RISPERDAL CONSTA) 25 MG injection Inject 2 mLs (25 mg total) into the muscle every 14 (fourteen) days. Last dose given Oct 06, 2015.  Next dose due Apr 14. 1 each 0   No current facility-administered medications for this visit.    Neurologic: Headache: No Seizure: No Paresthesias: No  Musculoskeletal: Strength & Muscle Tone: within normal limits Gait & Station: normal Patient leans: N/A  Psychiatric Specialty Exam: ROS  Last menstrual period 09/28/2015.There is no weight on file to calculate BMI.  General Appearance: Casual  Eye Contact:  Fair  Speech:  Clear and Coherent  Volume:  Normal  Mood:  Improved   Affect:  Smiling   Thought Process:  Coherent  Orientation:  Full (Time, Place, and Person)  Thought Content:  WDL  Suicidal Thoughts:  No  Homicidal Thoughts:  No  Memory:  Immediate;   Fair Recent;   Fair Remote;   Fair  Judgement:  Fair  Insight:  Fair  Psychomotor Activity:  Normal  Concentration:  Fair  Recall:  [employer] of Knowledge: Fair  Language: Fair  Akathisia:  No  Handed:  Right  AIMS (if indicated):    Assets:  Communication Skills Desire for Improvement Resilience Social Support Vocational/Educational  ADL's:  Intact  Cognition: WNL   Sleep:  good     Treatment Plan Summary:Medication management   Bipolar disorder with psychotic features Decrease Risperdal to 0.5 mg by mouth twice a day Decrease Cogentin to 0.5 mg twice daily Take Trileptal 300 mg by mouth twice daily We will hold off on the Risperdal Consta at this time and continue to assess patient's mood.  PTSD Continue therapy to address her trauma   Continue to monitor  Return to clinic in 1 month's time or call before if necessary  Elvin So, MD 10/16/2015, 1:45 PM

## 2015-10-16 NOTE — Progress Notes (Signed)
   THERAPIST PROGRESS NOTE  Session Time: 4:00 PM-4:50 PM  Participation Level: Active  Behavioral Response: CasualAlertEuthymic  Type of Therapy: Individual Therapy  Treatment Goals addressed: Coping and Diagnosis: Bipolar Disorder, mixed, stress management, developing healthy balance   Interventions: Supportive and Other: stress management skills  Summary: Colleen Peck is a 35 y.o. female who presented stating that she was still oevewhelmed with decisions but thinks she will be OK.  She said that therapy can help her with stress management. She realizes that she was trying to do too many things at once and she has to focus on one thing at a time. Therapist developed a treatment plan with patient. Patient wants to work on healthy balance of spiritual, mental and physical. She wants to work on Physicist, medical of her stressors. Patient wants to focus on herself first and then she identified her priorities so she has a plan to focus on her stressors. She needs an income, wants to move in with a roommate and wants to finish school. Therapist discussed some ways that patient could start to work on balance in her life. Patient discussed getting physical activity, getting outside and spirituality being important. Discussed relaxation and grounding exercises to help with difficult emotions. Patient related that breathing can be helpful but it depends on situation and that an effective strategy is to get away from the situation that is bothering her and pray.   Suicidal/Homicidal: No  Therapist Response: Therapist worked on Risk analyst. Therapist worked with patient on treatment plan in to define what patient wants to focus on in treatment. Therapist discussed problem solving and stress management strategies. Discussed relaxation as a strategy to help with negative emotions. Therapist worked with patient to begin to develop a plan of steps to take to find balance in her life.    Plan: 1.Return again in 1 weeks. 2.  Diagnosis: Axis I: Bipolar, mixed    Axis II: n/a    Alasha Mcguinness A 10/16/2015

## 2015-10-27 ENCOUNTER — Ambulatory Visit (INDEPENDENT_AMBULATORY_CARE_PROVIDER_SITE_OTHER): Payer: BLUE CROSS/BLUE SHIELD | Admitting: Psychiatry

## 2015-10-27 ENCOUNTER — Ambulatory Visit (INDEPENDENT_AMBULATORY_CARE_PROVIDER_SITE_OTHER): Payer: BLUE CROSS/BLUE SHIELD | Admitting: Licensed Clinical Social Worker

## 2015-10-27 DIAGNOSIS — F250 Schizoaffective disorder, bipolar type: Secondary | ICD-10-CM

## 2015-10-27 DIAGNOSIS — F3160 Bipolar disorder, current episode mixed, unspecified: Secondary | ICD-10-CM | POA: Diagnosis not present

## 2015-10-27 MED ORDER — RISPERIDONE 0.5 MG PO TABS: 0.5000 mg | ORAL_TABLET | Freq: Two times a day (BID) | ORAL | Status: DC

## 2015-10-27 MED ORDER — BENZTROPINE MESYLATE 0.5 MG PO TABS: 0.5000 mg | ORAL_TABLET | Freq: Two times a day (BID) | ORAL | Status: DC

## 2015-10-27 MED ORDER — OXCARBAZEPINE 150 MG PO TABS: 300.0000 mg | ORAL_TABLET | Freq: Two times a day (BID) | ORAL | Status: DC

## 2015-10-27 MED ORDER — RISPERIDONE MICROSPHERES 25 MG IM SUSR: 25.0000 mg | INTRAMUSCULAR | Status: DC

## 2015-10-27 NOTE — Progress Notes (Addendum)
Patient ID: Tanisia Peck, female   DOB: Apr 01, 1981, 35 y.o.   MRN: [id number] [facility] MD/PA/NP OP Progress Note  10/27/2015 1:27 PM Colleen Peck  MRN:  [id number]  Chief Complaint: [name]   HPI: Patient is a 35 yo AAF with Anxiety, PTSD,  Bipolar disorder. Patient was seen today for medication refill. She was seen by Dr. Einar Grad for medication management. Patient reported that she is unable to sleep and has been having worsening of her symptoms. She started having hallucinations and psychosis. She reported that she is not feeling well since her Risperdal Consta was  stopped. She was tearful and sad during the interview. She reported that her symptoms are getting worse and she is  will to control her mood symptoms. She stated that she has been taking her medications but they are not helping her. Her therapist marry was also present during the interview. Patient reported that she did not get along well with the previous provider and would like to change her providers at this time.   She is currently living with her family.  Visit Diagnosis:    ICD-9-CM ICD-10-CM   1. Schizoaffective disorder, bipolar type ([place]) 295.70 F25.0     Past Psychiatric History: Patient has a history of PTSD and anxiety and the multiple hospitalizations more so in the last 6 weeks she has had 2 hospitalizations. She reports being very overwhelmed.  Past Medical History:  Past Medical History  Diagnosis Date  . Asthma   . Seasonal allergies   . Depression   . PTSD (post-traumatic stress disorder)   . Thyroid disease 2009    Graves disease (pt reported resolved); hypothyriodism  . Abnormal pap     pt reports abnl pap many years ago.  Nl since then.  . Palpitations 03/12/2008  . Bipolar disorder [facility])     Past Surgical History  Procedure Laterality Date  . Dilation and curettage of uterus  March 2006    Family Psychiatric History:   Family History:  Family History  Problem Relation Age of Onset  . Drug  abuse Father   . Depression Maternal Aunt   . Depression Maternal Grandmother   . Anxiety disorder Maternal Grandmother   . COPD Maternal Grandmother   . Suicidality Cousin   . Depression Cousin   . Bipolar disorder Cousin   . Depression Maternal Aunt   . Hypertension Mother   . Depression Mother   . Diabetes Paternal Grandfather   . COPD Paternal Grandmother   . Heart disease Neg Hx     Social History:  Social History   Social History  . Marital Status: Single    Spouse Name: N/A  . Number of Children: N/A  . Years of Education: N/A   Social History Main Topics  . Smoking status: Never Smoker   . Smokeless tobacco: Never Used  . Alcohol Use: No     Comment: past use of alcohol in '08-'09  . Drug Use: No     Comment: past use of marijuana in '08-'09. occasional eats brownies w/ marijuana  before thanksgiving  . Sexual Activity: Not Currently    Birth Control/ Protection: Abstinence   Other Topics Concern  . 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    Allergies:  Allergies  Allergen Reactions  . Abilify [Aripiprazole] Other (See Comments)    AKATHISIA    Metabolic Disorder Labs: Lab  Results  Component Value Date   HGBA1C 6.1* 08/21/2015   MPG 128 08/21/2015   Lab Results  Component Value Date   PROLACTIN 8.8 10/05/2015   PROLACTIN 27.1* 08/21/2015   Lab Results  Component Value Date   CHOL 150 10/05/2015   TRIG 192* 10/05/2015   HDL 32* 10/05/2015   CHOLHDL 4.7 10/05/2015   VLDL 38 10/05/2015   LDLCALC 80 10/05/2015   LDLCALC 122* 08/21/2015     Current Medications: Current Outpatient Prescriptions  Medication Sig Dispense Refill  . benztropine (COGENTIN) 0.5 MG tablet Take 1 tablet (0.5 mg total) by mouth 2 (two) times daily. 60 tablet 2  . diclofenac (VOLTAREN) 75 MG EC tablet Take 1 tablet (75 mg total) by mouth 2 (two) times daily as needed for mild  pain or moderate pain. 14 tablet 0  . ferrous sulfate 325 (65 FE) MG tablet Take 1 tablet (325 mg total) by mouth daily with breakfast. 30 tablet 0  . levothyroxine (SYNTHROID, LEVOTHROID) 112 MCG tablet Take 2 tablets (224 mcg total) by mouth daily before breakfast. 60 tablet 0  . Oxcarbazepine (TRILEPTAL) 300 MG tablet Take 1 tablet (300 mg total) by mouth 2 (two) times daily. 60 tablet 0  . polyethylene glycol powder (GLYCOLAX/MIRALAX) powder Take 17 g by mouth 2 (two) times daily as needed. 3350 g 1  . risperiDONE (RISPERDAL) 0.5 MG tablet Take 1 tablet (0.5 mg total) by mouth 2 (two) times daily. 60 tablet 2  . risperiDONE microspheres (RISPERDAL CONSTA) 25 MG injection Inject 2 mLs (25 mg total) into the muscle every 14 (fourteen) days. Last dose given Oct 06, 2015.  Next dose due Apr 14. 1 each 0   No current facility-administered medications for this visit.    Neurologic: Headache: No Seizure: No Paresthesias: No  Musculoskeletal: Strength & Muscle Tone: within normal limits Gait & Station: normal Patient leans: N/A  Psychiatric Specialty Exam: ROS   Last menstrual period 09/28/2015.There is no weight on file to calculate BMI.  General Appearance: Casual  Eye Contact:  Fair  Speech:  Slow  Volume:  Decreased  Mood:  Improved   Affect:  Tearful  Thought Process:  Disorganized  Orientation:  Full (Time, Place, and Person)  Thought Content:  Hallucinations: Auditory Visual  Suicidal Thoughts:  No  Homicidal Thoughts:  No  Memory:  Immediate;   Fair Recent;   Fair Remote;   Fair  Judgement:  Fair  Insight:  Fair  Psychomotor Activity:  Normal  Concentration:  Fair  Recall:  [employer] of Knowledge: Fair  Language: Fair  Akathisia:  No  Handed:  Right  AIMS (if indicated):    Assets:  Communication Skills Desire for Improvement Resilience Social Support Vocational/Educational  ADL's:  Intact  Cognition: WNL  Sleep:  good     Treatment Plan  Summary:Medication management   Start her back on Risperdal Consta IM every 2 weeks. She will get her first dose today.  Decrease Risperdal to 0.5 mg by mouth twice a day Decrease Cogentin to 0.5 mg twice daily Continue  Trileptal 300 mg by mouth twice daily   PTSD Continue therapy to address her trauma   Continue to monitor Follow-up in 2 weeks    More than 50% of the time spent in psychoeducation, counseling and coordination of care.    This note was generated in part or whole with voice recognition software. Voice regonition is usually quite accurate but there are transcription errors that can  and very often do occur. I apologize for any typographical errors that were not detected and corrected.    Rainey Pines, MD 10/27/2015, 1:27 PM    Patient was seen with her sister. She was unable to get the Risperdal Consta from the pharmacy. The pharmacy reported that they do not have the injection at this time but  will order for the patient. Patient appeared very apprehensive and tired during the interview. She reported that she has not slept in several days and she wants to sleep. She remained focus that she wants a prescription of trazodone. Patient reported that she wants her medications to be refilled. She reported that she was given a prescription of trazodone when she was discharged from the hospital and she currently has supply of the medication. She also remained focused on left-sided abdominal pain. She reported that she has already seen her primary care physician and they have told her that she does not have any medical issues at this time. She reported that she wants a higher dose of trazodone at this time. Advised patient to start taking 150-200 mg of trazodone at bedtime to help her sleep. Also reviewed her notes in detail and was noted that she was discharged on Risperdal Consta from the hospital but she missed her last dose. She started declining from there. She is reluctant to  start any injections. Her sister remains supportive. We discussed at length about the options of giving her any other injection. Her sister also provided support. She will try to get her injection from the pharmacy in the next couple of days.

## 2015-10-27 NOTE — Progress Notes (Signed)
   THERAPIST PROGRESS NOTE  Session Time: 3:30 PM-4:00 PM  Participation Level: Minimal  Behavioral Response: CasualAlertDysphoric and tearful  Type of Therapy: Individual Therapy  Treatment Goals addressed: Coping and Diagnosis: Bipolar Disorder, most recent episode mixed  Interventions: Solution Focused, Supportive and Family Systems  Summary: Colleen Peck is a 35 y.o. female who presents with having slept for days and crying all the time. Therapist had sat in with patient when she saw doctor and learned there is worsening of symptoms. She started having having hallucinations and psychosis. It appears that she stopped feeling well since her Risperdal Consta was stopped. Her symptoms are worse and she is unable to control her mood symptoms. Patient met with therapist individual and was tearful and sad but when therapist encouraged her to discuss her feelings patient would not respond to therapist's questions. She asked for her sister, [name], to be brought into the session. Latishia related that patient has not wanted to go out of the house. She also said that she is there for support and she is able to listen to patient but does not know how to help her apply strategies that will help her progress. She thinks that she may need more intensive treatment. Therapist related that first thing is to stabilize patient by getting her medication that doctor was ordering to help her stabilize. Once she has medication, therapist can work with her on coping strategies.   Suicidal/Homicidal: No  Therapist Response: Therapist utilized supportive interventions, problems solving interventions and family system interventions to help patient with worsening of symptoms and to develop a strategy to help her with symptoms.   Plan: Return again in 1 week.2.Patient continue with medication management to address symptoms.3.Therapist educate family and patient on coping strategies to address symptoms.    Diagnosis: Axis I: Bipolar, mixed    Axis II: n/a    Bowman,Mary A 10/27/2015

## 2015-11-03 ENCOUNTER — Ambulatory Visit (INDEPENDENT_AMBULATORY_CARE_PROVIDER_SITE_OTHER): Payer: BLUE CROSS/BLUE SHIELD | Admitting: Licensed Clinical Social Worker

## 2015-11-03 ENCOUNTER — Encounter: Payer: Self-pay | Admitting: Psychiatry

## 2015-11-03 ENCOUNTER — Ambulatory Visit (INDEPENDENT_AMBULATORY_CARE_PROVIDER_SITE_OTHER): Payer: BLUE CROSS/BLUE SHIELD | Admitting: Psychiatry

## 2015-11-03 VITALS — BP 124/82 | Temp 97.4°F | Ht 66.5 in | Wt 237.8 lb

## 2015-11-03 DIAGNOSIS — F250 Schizoaffective disorder, bipolar type: Secondary | ICD-10-CM | POA: Diagnosis not present

## 2015-11-03 DIAGNOSIS — F3160 Bipolar disorder, current episode mixed, unspecified: Secondary | ICD-10-CM | POA: Diagnosis not present

## 2015-11-03 MED ORDER — TRAZODONE HCL 100 MG PO TABS: 200.0000 mg | ORAL_TABLET | Freq: Every day | ORAL | Status: DC

## 2015-11-03 NOTE — Progress Notes (Signed)
   THERAPIST PROGRESS NOTE  Session Time: 10:12 AM-11:00 AM  Participation Level: Active  Behavioral Response: CasualAlertEuthymic  Type of Therapy: Individual Therapy  Treatment Goals addressed: Coping and Diagnosis: Bipolar Disorder, Mixed episode  Interventions: Solution Focused, Supportive, Family Systems and Other: Problem solving, rapport building  Summary: Landrey Bernal is a 35 y.o. female who presents with self-report that she feels better and thinks it is mainly due to thyroid medication. She asked therapist to change treatment plan because therapist had not heard her right and said she is not assertive now but used to be assertive. She also wanted to change her supports as not just one friend but having friends. This was changed so that treatment plan is updated. We discussed treatment goals of finding more balance in her life. She relates that she has bills piling up and this is a stressor but does not feel ready to go back to work. She said that she had too many obligations before and therapist discussed that it would help to cut back on obligations and give her the time to take care of herself right now. This would help her in making progress. She said she is afraid to leave the house but at first she did not want to discuss but then opened up to therapist. She said that she was afraid before of someone harming her, was afraid for her safety but now sees that this was distorted. Therapist gave her positive feedback about willing to open up in session. Patient has concerns about accuracy in record so therapist explore some of the notes with patient. Patient depressed having nightmares and feels that addressing balance in her life will include addressing this.   Suicidal/Homicidal: No  Therapist Response: Therapist focused on wanting to help her taking a step forward and dealing with issues so that she can get to a point of being able to get back to work. Therapist affirmed that  patient has to focus on taking care of herself and that will help to get better. Therapist also agreed that it would patient to cut back on obligations right now to help her focus on herself. Therapist pointed out is doing better from next week. Therapist gave patient positive feedback about her willingness to trust therapist enough to open up in session. This will help in building therapeutic relationship. Therapist provided information for family to help give patient support. Therapist explored reasons patient is afraid to leave the house and this was helpful as patient able to see some distortions in her thinking. Therapist explored accuracy in records in order that patient knows she can feel free to voice concerns and feel listened to by therapist to build trust in relationship. Therapist explored patient's nightmares and whether we want to focus on PTSD and patient feels this will be addressed with finding balance.   Plan: Return again in 1 weeks 2.patient work on self-care by cutting back on obligations and focusing on herself.3.patient give handouts to family about how to support patient.  Diagnosis: Axis I: Bipolar, mixed    Axis II: n/a    Bowman,Mary A 11/03/2015

## 2015-11-03 NOTE — Progress Notes (Signed)
Patient ID: Colleen Peck, female   DOB: 1981-03-24, 35 y.o.   MRN: [id number] [facility] MD/PA/NP OP Progress Note  11/03/2015 12:17 PM Colleen Peck  MRN:  [id number]  Chief Complaint: Overwhelmed Chief Complaint    Follow-up      HPI: Patient is a 35 yo AAF with Paranoia ,  PTSD,  Bipolar disorder presented for follow-up appointment accompanied by her sister.. She reported that she continues to have nightmares at night and she does not want to discuss that in detail. She is also having problems with her medications and she feels that the Risperdal is making her feel worse. She continues to complain about her medications and she feels that the thyroid medications are the only one which is helping her. Patient reported that she is going to find Endo for prior neurologist with the help of her PCP. She has a PCP appointment tomorrow morning. Her sister was just sitting there and did not say anything. Patient reported that she cannot explain how she feels after she takes her Risperdal. We discussed about different options. However she is reported that she does not want to keep on changing  the medications. She reported that she has taken Haldol in the past but she is not willing to have her medications adjusted. She reported that she was only given Seroquel while she was in the hospital. Patient reported that she will talk to her endocrinologist about the dose of her medications. She does not trust her therapist as well as her psychiatrist. Continues to show paranoia.  He was also asking that how long she is going to be medicated. She currently denied having any suicidal homicidal ideations or plans.  Visit Diagnosis:    ICD-9-CM ICD-10-CM   1. Schizoaffective disorder, bipolar type ([place]) 295.70 F25.0     Past Psychiatric History: Patient has a history of PTSD and anxiety and the multiple hospitalizations more so in the last 6 weeks she has had 2 hospitalizations. She reports being very  overwhelmed.  Past Medical History:  Past Medical History  Diagnosis Date  . Asthma   . Seasonal allergies   . Depression   . PTSD (post-traumatic stress disorder)   . Thyroid disease 2009    Graves disease (pt reported resolved); hypothyriodism  . Abnormal pap     pt reports abnl pap many years ago.  Nl since then.  . Palpitations 03/12/2008  . Bipolar disorder [facility])     Past Surgical History  Procedure Laterality Date  . Dilation and curettage of uterus  March 2006    Family Psychiatric History:   Family History:  Family History  Problem Relation Age of Onset  . Drug abuse Father   . Depression Maternal Aunt   . Depression Maternal Grandmother   . Anxiety disorder Maternal Grandmother   . COPD Maternal Grandmother   . Suicidality Cousin   . Depression Cousin   . Bipolar disorder Cousin   . Depression Maternal Aunt   . Hypertension Mother   . Depression Mother   . Diabetes Paternal Grandfather   . COPD Paternal Grandmother   . Heart disease Neg Hx     Social History:  Social History   Social History  . Marital Status: Single    Spouse Name: N/A  . Number of Children: N/A  . Years of Education: N/A   Social History Main Topics  . Smoking status: Never Smoker   . Smokeless tobacco: Never Used  . Alcohol Use: No  Comment: past use of alcohol in '08-'09  . Drug Use: No     Comment: past use of marijuana in '08-'09. occasional eats brownies w/ marijuana  before thanksgiving  . Sexual Activity: Not Currently    Birth Control/ Protection: Abstinence   Other Topics Concern  . 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    Allergies:  Allergies  Allergen Reactions  . Abilify [Aripiprazole] Other (See Comments)    AKATHISIA    Metabolic Disorder Labs: Lab Results  Component Value Date   HGBA1C 6.1* 08/21/2015   MPG 128 08/21/2015   Lab Results  Component Value Date    PROLACTIN 8.8 10/05/2015   PROLACTIN 27.1* 08/21/2015   Lab Results  Component Value Date   CHOL 150 10/05/2015   TRIG 192* 10/05/2015   HDL 32* 10/05/2015   CHOLHDL 4.7 10/05/2015   VLDL 38 10/05/2015   LDLCALC 80 10/05/2015   LDLCALC 122* 08/21/2015     Current Medications: Current Outpatient Prescriptions  Medication Sig Dispense Refill  . benztropine (COGENTIN) 0.5 MG tablet Take 1 tablet (0.5 mg total) by mouth 2 (two) times daily. 60 tablet 2  . diclofenac (VOLTAREN) 75 MG EC tablet Take 1 tablet (75 mg total) by mouth 2 (two) times daily as needed for mild pain or moderate pain. 14 tablet 0  . ferrous sulfate 325 (65 FE) MG tablet Take 1 tablet (325 mg total) by mouth daily with breakfast. 30 tablet 0  . levothyroxine (SYNTHROID, LEVOTHROID) 112 MCG tablet Take 2 tablets (224 mcg total) by mouth daily before breakfast. 60 tablet 0  . Oxcarbazepine (TRILEPTAL) 150 MG tablet Take 2 tablets (300 mg total) by mouth 2 (two) times daily. 60 tablet 1  . polyethylene glycol powder (GLYCOLAX/MIRALAX) powder Take 17 g by mouth 2 (two) times daily as needed. 3350 g 1  . risperiDONE (RISPERDAL) 0.5 MG tablet Take 1 tablet (0.5 mg total) by mouth 2 (two) times daily. 60 tablet 2  . risperiDONE microspheres (RISPERDAL CONSTA) 25 MG injection Inject 2 mLs (25 mg total) into the muscle every 14 (fourteen) days. Last dose given Oct 06, 2015.  Next dose due Apr 14. 1 each 6   No current facility-administered medications for this visit.    Neurologic: Headache: No Seizure: No Paresthesias: No  Musculoskeletal: Strength & Muscle Tone: within normal limits Gait & Station: normal Patient leans: N/A  Psychiatric Specialty Exam: ROS   Blood pressure 124/82, temperature 97.4 F (36.3 C), temperature source Tympanic, height 5' 6.5" (1.689 m), weight 237 lb 12.8 oz (107.865 kg), last menstrual period 09/28/2015.Body mass index is 37.81 kg/(m^2).  General Appearance: Casual  Eye Contact:   Fair  Speech:  Slow  Volume:  Decreased  Mood:  Improved   Affect:  Tearful  Thought Process:  Disorganized  Orientation:  Full (Time, Place, and Person)  Thought Content:  Hallucinations: Auditory Visual  Suicidal Thoughts:  No  Homicidal Thoughts:  No  Memory:  Immediate;   Fair Recent;   Fair Remote;   Fair  Judgement:  Fair  Insight:  Fair  Psychomotor Activity:  Normal  Concentration:  Fair  Recall:  [employer] of Knowledge: Fair  Language: Fair  Akathisia:  No  Handed:  Right  AIMS (if indicated):    Assets:  Communication Skills Desire for Improvement Resilience Social Support Vocational/Educational  ADL's:  Intact  Cognition: WNL  Sleep:  good     Treatment Plan Summary:Medication management   She was unable to get Risperdal Consta as it is too expensive and she is unable to afford the medications. Discussed with her about her medications in detail and she is willing to try the medications as prescribed Decrease Risperdal to 0.5 mg by mouth twice a day Decrease Cogentin to 0.5 mg twice daily Continue  Trileptal 300 mg by mouth twice daily Trazodone  200 mg by mouth daily at bedtime  She reported that she has enough supply of the medication  PTSD Continue therapy to address her trauma   Continue to monitor Follow-up in 2 weeks    More than 50% of the time spent in psychoeducation, counseling and coordination of care.    This note was generated in part or whole with voice recognition software. Voice regonition is usually quite accurate but there are transcription errors that can and very often do occur. I apologize for any typographical errors that were not detected and corrected.    Rainey Pines, MD 11/03/2015, 12:17 PM

## 2015-11-03 NOTE — Progress Notes (Deleted)
Leonette Weckwerth is a 35 y.o. female patient ***.  She shares     [employer]

## 2015-11-04 ENCOUNTER — Encounter: Payer: Self-pay | Admitting: Family Medicine

## 2015-11-04 ENCOUNTER — Ambulatory Visit (INDEPENDENT_AMBULATORY_CARE_PROVIDER_SITE_OTHER): Payer: BLUE CROSS/BLUE SHIELD | Admitting: Family Medicine

## 2015-11-04 VITALS — BP 124/76 | HR 88 | Temp 98.2°F | Ht 67.0 in | Wt 237.3 lb

## 2015-11-04 DIAGNOSIS — K5900 Constipation, unspecified: Secondary | ICD-10-CM | POA: Diagnosis not present

## 2015-11-04 DIAGNOSIS — E039 Hypothyroidism, unspecified: Secondary | ICD-10-CM | POA: Diagnosis not present

## 2015-11-04 DIAGNOSIS — D259 Leiomyoma of uterus, unspecified: Secondary | ICD-10-CM

## 2015-11-04 DIAGNOSIS — R109 Unspecified abdominal pain: Secondary | ICD-10-CM

## 2015-11-04 NOTE — Progress Notes (Signed)
Subjective: CC: abdominal pain, hypothyroidism HPI: Colleen Peck is a 35 y.o. female presenting to clinic today for office visit. Concerns today include:  1. Right sided abdominal pain/ constipation Patient notes that she had a period in march and last week. Notes clots.  Not heavy.  Periods last about 5 days.  Occ cramping with clots.  Not sexually active.  No on OCPs.  Continues to have right sided abdominal pain.  Concerned for possible uterine etiology.  No nausea, vomiting.  Last BM 3 days ago.  She notes that her medications have been drying her out.  Has not taken Miralax.  Stopped taking her Fe d/t constipation.  She notes that abdominal pain is right under her ribs and associated with stress.  Voiding normally.  No hematuria, fevers, chills.  2. Bipolar disorder/ Thyroid disorder Patient notes compliance with medications being prescribed.  She notes that she has started seeing a new psychiatrist.  She notes increased stress, reporting that she has moved back in with her mother.  She notes that she has large financial constraints and has quit school/ work secondary to her recent hospitalizations.  She notes she does not want to discuss these issues further because she doesn't want to cry.  She does report that she saw office notes noting her involvement in the [employer] movement and wanted to be clear that she had no involvement in that organization.  It was something she "thought" she might become involved in.  Additionally, she asks that she be referred to endocrinology, as she would like to make sure that her mental health is not related to hypothyroidism.  Endorses constipation and weight loss.  Endorses decreased appetite and increased anxiety.  Social History Reviewed: non smoker. FamHx and MedHx reviewed.  Please see EMR.  ROS: Per HPI  Objective: Office vital signs reviewed. BP 124/76 mmHg  Pulse 88  Temp(Src) 98.2 F (36.8 C) (Oral)  Ht 5\' 7"  (1.702 m)  Wt  237 lb 4.8 oz (107.639 kg)  BMI 37.16 kg/m2  LMP 10/29/2015  Physical Examination:  General: Awake, tired appearing, obese HEENT: Normal    Eyes: PERRLA, EOMI Neck: no fullness/ masses Cardio: regular rate, +2 DP Pulm: normal WOB on room air, no wheeze GI: soft, non-tender, non-distended, bowel sounds present x4, no hepatomegaly, no splenomegaly.   GU:  no abdominal/ adnexal masses, no suprapubic TTP Psych: mood depressed, intermittently tearful, speech normal  Assessment/ Plan: 35 y.o. female   1. Right-sided abdominal pain of unknown cause.  Reviewed abdominal ultrasound and pelvic ultrasound from this year.  Pelvic u/s with small fibroids appreciated.  Pain seems MSK in nature vs psychosomatic.  Patient is reluctant to take Voltaren because she feels that she is on too many medications. - Recommend that she consider short course of NSAIDs. - Reiterated that so far, nothing to support pathology w/ normal LFTs and ultrasound  2. Hypothyroidism (acquired).  Last TSH reviewed. WNL.  ~8 lb weight loss since February.  March labs reviewed.  Unremarkable except for mild anemia. - Continue current dose of Synthroid for now. - Ambulatory referral to Endocrinology  3. Uterine leiomyoma, unspecified location - Does not wish to have surgical intervention for this - Defer referral to gyn for now.  Suspect that fibroids are probably not the cause of her abdominal pain  4. Constipation, unspecified constipation type - Recommended resuming Miralax - continue fiber rich foods - Encouraged physical activity  Janora Norlander, DO PGY-2, [facility]

## 2015-11-04 NOTE — Patient Instructions (Addendum)
Referral for endocrinology has been placed.  Plan to follow up with me as needed or in about 3-6 months.

## 2015-11-06 ENCOUNTER — Telehealth: Payer: Self-pay | Admitting: Licensed Clinical Social Worker

## 2015-11-06 NOTE — Telephone Encounter (Signed)
Patient referred by Dr. Ricki Miller for community resources.  call to patient.  Patient states she has a bad connection and unable to hear CSW.   Patient informed CSW she would return the call once she was able to obtain a better connection or use a different phone.  Return call from patient.  Patient's main concern is not being able to make payments on past due bills when she lived in her apartment.  States she does not want to mess up her credit.  Patient is on leave from her job and has no income at this time. She is currently living with her mother.    CSW provided patient with emotional support. Discussed what community resources and options patient has already explored. Patient has resources CSW would have provided to assist with paying bills.  CSW provided patient with information on [employer] and [employer] to address her credit concerns.    Patient was appreciative of talking with CSW, states she will follow up with both agencies.    Casimer Lanius. [place] [facility],  [phone] 9:28 AM

## 2015-11-10 ENCOUNTER — Encounter: Payer: Self-pay | Admitting: Family Medicine

## 2015-11-10 ENCOUNTER — Ambulatory Visit (INDEPENDENT_AMBULATORY_CARE_PROVIDER_SITE_OTHER): Payer: BLUE CROSS/BLUE SHIELD | Admitting: Family Medicine

## 2015-11-10 VITALS — BP 116/64 | HR 88 | Temp 98.6°F | Ht 67.0 in | Wt 235.4 lb

## 2015-11-10 DIAGNOSIS — M79662 Pain in left lower leg: Secondary | ICD-10-CM | POA: Diagnosis not present

## 2015-11-10 NOTE — Patient Instructions (Signed)
Thank you for coming in,   Most likely you have a strain of your muscle.   You can get a compression sleeve to help with the pain.   Please get a heel lift on each foot and this will help with the pain as well.      Please bring all of your medications with you to each visit.   Sign up for My Chart to have easy access to your labs results, and communication with your Primary care physician   Please feel free to call with any questions or concerns at any time, at [phone]. --Dr. Raeford Razor

## 2015-11-10 NOTE — Progress Notes (Signed)
   Subjective:    Patient ID: Colleen Peck, female    DOB: Jun 12, 1981, 35 y.o.   MRN: [id number]  Seen for Same day visit for   CC: Left calf pain.    She reports a history of a tear sustained a few weeks ago.  She was seen at [facility] urgent care and referred to an orthopedists.  She reports the orthopedist told her she had a tear.  Was placed in a walking boot during this time.  Was about the start physical therapy.   She was getting her daughter ready for school and felt a pull of her left calf. Pain is occurring in her left upper lateral calf. Having pain with walking.  Pain is severe.  Hasn't noticed any swelling or bruising.  Denies any prior surgery in her calf.  No prior history of DVT.   PMH: bipolar, obesity  SH: never smoker   Review of Systems   See HPI for ROS. Objective:  BP 116/64 mmHg  Pulse 88  Temp(Src) 98.6 F (37 C) (Oral)  Ht 5\' 7"  (1.702 m)  Wt 235 lb 6.4 oz (106.777 kg)  BMI 36.86 kg/m2  LMP 10/29/2015  General: NAD Cardiac: 2+ radial and PT pulses bilaterally Extremities:  WWP. Skin: warm and dry, no rashes noted MSK: walking with a limp  No noticeable swelling or ecchymosis. Tender to palpation on the left lateral gastrocnemius Normal knee flexion and extension 5 out of 5 strength with dorsiflexion and plantarflexion Pain worse with dorsiflexion Negative thompson test  Neurovascularly intact    Assessment & Plan:   Pain of left calf Most likely a strain.  Limited US of left lower leg performed by myself was not revealing for any hypoechoic change in the lateral or medial gastrocnemius. No suggestion of hematoma. No achilles tear or tendinopathy.  - provided home modalities  - encouraged to continue with physical therapy since she has already been established.  - advised that she should wear her boot if having pain with walking.  - referral placed to sports medicine for heel lifts and ongoing US imaging if needed.  - provided with an  ACE wrap today

## 2015-11-10 NOTE — Assessment & Plan Note (Addendum)
Most likely a strain.  Limited US of left lower leg performed by myself was not revealing for any hypoechoic change in the lateral or medial gastrocnemius. No suggestion of hematoma. No achilles tear or tendinopathy.  - provided home modalities  - encouraged to continue with physical therapy since she has already been established.  - advised that she should wear her boot if having pain with walking.  - referral placed to sports medicine for heel lifts and ongoing US imaging if needed.  - provided with an ACE wrap today

## 2015-11-12 ENCOUNTER — Encounter: Payer: Self-pay | Admitting: Psychiatry

## 2015-11-12 ENCOUNTER — Ambulatory Visit (INDEPENDENT_AMBULATORY_CARE_PROVIDER_SITE_OTHER): Payer: BLUE CROSS/BLUE SHIELD | Admitting: Licensed Clinical Social Worker

## 2015-11-12 ENCOUNTER — Ambulatory Visit: Payer: BLUE CROSS/BLUE SHIELD | Admitting: Psychiatry

## 2015-11-12 ENCOUNTER — Ambulatory Visit (INDEPENDENT_AMBULATORY_CARE_PROVIDER_SITE_OTHER): Payer: BLUE CROSS/BLUE SHIELD | Admitting: Psychiatry

## 2015-11-12 VITALS — BP 110/72 | HR 66 | Temp 97.9°F | Ht 67.0 in | Wt 238.4 lb

## 2015-11-12 DIAGNOSIS — F250 Schizoaffective disorder, bipolar type: Secondary | ICD-10-CM

## 2015-11-12 MED ORDER — RISPERIDONE 0.5 MG PO TABS: 0.5000 mg | ORAL_TABLET | Freq: Two times a day (BID) | ORAL | Status: DC

## 2015-11-12 MED ORDER — OXCARBAZEPINE 150 MG PO TABS: 300.0000 mg | ORAL_TABLET | Freq: Two times a day (BID) | ORAL | Status: DC

## 2015-11-12 MED ORDER — TRAZODONE HCL 100 MG PO TABS: 200.0000 mg | ORAL_TABLET | Freq: Every day | ORAL | Status: DC

## 2015-11-12 MED ORDER — BENZTROPINE MESYLATE 0.5 MG PO TABS: 0.5000 mg | ORAL_TABLET | Freq: Two times a day (BID) | ORAL | Status: DC

## 2015-11-12 NOTE — Progress Notes (Signed)
Patient ID: Colleen Peck, female   DOB: [birth year], 35 y.o.   MRN: [id number] [facility] MD/PA/NP OP Progress Note  11/12/2015 12:16 PM Colleen Peck  MRN:  [id number]  Chief Complaint: Overwhelmed Chief Complaint    Follow-up; Medication Refill      HPI: Patient is a 35 yo AAF with Paranoia , PTSD,  Bipolar disorder presented for follow-up appointment.  She Remained suspicious and paranoid. She stated that she does not want to continue taking her medications as she has been feeling better after her therapy session. She reported that she read up about her medications and she thought that she has been having problems in her legs relates to the medications. 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 She reported that she wants to stop taking her medications. Patient reported that she does not want to keep coming for her therapy sessions as well as for her medication management appointments. Patient remained resistant about taking her medications and was continued to argue back and forth throughout this interview about her medications.   She stated that she has an appointment with the endocrinologist to get herself evaluated about her thyroid problems. She reported that she has recently seen a PCP but she was not sure if they were concerned about her as well. She remains suspicious about all her medical providers.    She currently denied having any suicidal homicidal ideations or plans.  Visit Diagnosis:    ICD-9-CM ICD-10-CM   1. Schizoaffective disorder, bipolar type ([place]) 295.70 F25.0     Past Psychiatric History: Patient has a history of PTSD and anxiety and the multiple hospitalizations more so in the last 6 weeks she has had 2 hospitalizations. She reports being very overwhelmed.  Past Medical History:  Past Medical History  Diagnosis Date  . Asthma   . Seasonal allergies   . Depression   . PTSD (post-traumatic stress disorder)   . Thyroid  disease 2009    Graves disease (pt reported resolved); hypothyriodism  . Abnormal pap     pt reports abnl pap many years ago.  Nl since then.  . Palpitations 03/12/2008  . Bipolar disorder [facility])     Past Surgical History  Procedure Laterality Date  . Dilation and curettage of uterus  March 2006    Family Psychiatric History:   Family History:  Family History  Problem Relation Age of Onset  . Drug abuse Father   . Depression Maternal Aunt   . Depression Maternal Grandmother   . Anxiety disorder Maternal Grandmother   . COPD Maternal Grandmother   . Suicidality Cousin   . Depression Cousin   . Bipolar disorder Cousin   . Depression Maternal Aunt   . Hypertension Mother   . Depression Mother   . Diabetes Paternal Grandfather   . COPD Paternal Grandmother   . Heart disease Neg Hx     Social History:  Social History   Social History  . Marital Status: Single    Spouse Name: N/A  . Number of Children: N/A  . Years of Education: N/A   Social History Main Topics  . Smoking status: Never Smoker   . Smokeless tobacco: Never Used  . Alcohol Use: No     Comment: past use of alcohol in '08-'09  . Drug Use: No     Comment: past use of marijuana in '08-'09. occasional eats brownies w/ marijuana  before thanksgiving  . Sexual Activity: Not  Currently    Birth Control/ Protection: Abstinence   Other Topics Concern  . 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    Allergies:  Allergies  Allergen Reactions  . Abilify [Aripiprazole] Other (See Comments)    AKATHISIA    Metabolic Disorder Labs: Lab Results  Component Value Date   HGBA1C 6.1* 08/21/2015   MPG 128 08/21/2015   Lab Results  Component Value Date   PROLACTIN 8.8 10/05/2015   PROLACTIN 27.1* 08/21/2015   Lab Results  Component Value Date   CHOL 150 10/05/2015   TRIG 192* 10/05/2015   HDL 32* 10/05/2015   CHOLHDL 4.7  10/05/2015   VLDL 38 10/05/2015   LDLCALC 80 10/05/2015   LDLCALC 122* 08/21/2015     Current Medications: Current Outpatient Prescriptions  Medication Sig Dispense Refill  . benztropine (COGENTIN) 0.5 MG tablet Take 1 tablet (0.5 mg total) by mouth 2 (two) times daily. 60 tablet 2  . diclofenac (VOLTAREN) 75 MG EC tablet Take 1 tablet (75 mg total) by mouth 2 (two) times daily as needed for mild pain or moderate pain. 14 tablet 0  . ferrous sulfate 325 (65 FE) MG tablet Take 1 tablet (325 mg total) by mouth daily with breakfast. 30 tablet 0  . levothyroxine (SYNTHROID, LEVOTHROID) 112 MCG tablet Take 2 tablets (224 mcg total) by mouth daily before breakfast. 60 tablet 0  . Oxcarbazepine (TRILEPTAL) 150 MG tablet Take 2 tablets (300 mg total) by mouth 2 (two) times daily. 60 tablet 1  . polyethylene glycol powder (GLYCOLAX/MIRALAX) powder Take 17 g by mouth 2 (two) times daily as needed. 3350 g 1  . risperiDONE (RISPERDAL) 0.5 MG tablet Take 1 tablet (0.5 mg total) by mouth 2 (two) times daily. 60 tablet 2  . traZODone (DESYREL) 100 MG tablet Take 2 tablets (200 mg total) by mouth at bedtime. 60 tablet 1   No current facility-administered medications for this visit.    Neurologic: Headache: No Seizure: No Paresthesias: No  Musculoskeletal: Strength & Muscle Tone: within normal limits Gait & Station: normal Patient leans: N/A  Psychiatric Specialty Exam: ROS   Blood pressure 110/72, pulse 66, temperature 97.9 F (36.6 C), temperature source Tympanic, height 5\' 7"  (1.702 m), weight 238 lb 6.4 oz (108.138 kg), last menstrual period 10/29/2015, SpO2 96 %.Body mass index is 37.33 kg/(m^2).  General Appearance: Casual  Eye Contact:  Fair  Speech:  Clear and Coherent  Volume:  Normal  Mood:  Improved   Affect:  Tearful  Thought Process:  Disorganized  Orientation:  Full (Time, Place, and Person)  Thought Content:  Hallucinations: Auditory Visual  Suicidal Thoughts:  No   Homicidal Thoughts:  No  Memory:  Immediate;   Fair Recent;   Fair Remote;   Fair  Judgement:  Fair  Insight:  Fair  Psychomotor Activity:  Normal  Concentration:  Fair  Recall:  [employer] of Knowledge: Fair  Language: Fair  Akathisia:  No  Handed:  Right  AIMS (if indicated):    Assets:  Communication Skills Desire for Improvement Resilience Social Support Vocational/Educational  ADL's:  Intact  Cognition: WNL  Sleep:  good     Treatment Plan Summary:Medication management   Continue  Risperdal to 0.5 mg by mouth twice a day Decrease Cogentin to 0.5 mg twice daily Continue  Trileptal 300 mg by mouth twice daily Trazodone  200 mg by  mouth daily at bedtime    PTSD Continue therapy to address her trauma   Continue to monitor Follow-up in 2 weeks    More than 50% of the time spent in psychoeducation, counseling and coordination of care.    This note was generated in part or whole with voice recognition software. Voice regonition is usually quite accurate but there are transcription errors that can and very often do occur. I apologize for any typographical errors that were not detected and corrected.    Rainey Pines, MD 11/12/2015, 12:16 PM

## 2015-11-12 NOTE — Progress Notes (Signed)
   THERAPIST PROGRESS NOTE  Session Time: 11:15 AM-12:00 PM  Participation Level: Active  Behavioral Response: Casual and GuardedAlertEuthymic  Type of Therapy: Individual Therapy  Treatment Goals addressed: Coping and Diagnosis: Bipolar disorder, most recent episode mixed, patient will work on establishing balance, improve psychiatric symptoms.   Interventions: CBT, Solution Focused, Biofeedback and Supportive  Summary: Colleen Peck is a 35 y.o. female who presents with relating that she is making it. That she is doing okay. Discussed the issue of trust in the therapeutic relationship and patient said that she feels that way about therapist in general. Patient explained that "I want my joy". She explains that she has always been mistrustful based on experiences in her life she has had but that last year it got worse based on things that happened. Therapist shared with patient that paranoia was extreme and the need to challenge the truthfulness of her beliefs sometimes. That she went from being too trustful to mistrustful. She was open to looking beliefs and to look at their accuracy. Worked on setting healthy boundaries for herself and being involved in organizations that made sense to her. Discussed lowering demands on herself which will help her commit more fully to the things that she cares about and working helpful perspective in her relationship with others that will support her in her goals. Patient feels that origin of some of paranoia comes from PTSD. 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 Patient focused on diagnosis and therapist redirected to focus on managing her symptoms to help her stabilize. Patient shared that she is having nightmares so therapist gave her an handout from trauma workbook of ways to handle nightmares.     Suicidal/Homicidal: No  Therapist Response: Therapist worked with patient on challenging paranoid thoughts through  herself and somebody she trusts who can give her perspective. Therapist worked with patient on setting healthy boundaries, lowering expectations on herself to help focus on activities that she cares about. Therapist also worked with patient on healthy perspectives in relationship with others. Therapist used a strength based and supportive intervention. Therapist continues to work on building therapeutic relationship. Therapist worked with patient on focusing on what she wants to do in terms of work and school as well as roadblocks that interfere with her moving forward with her life. Therapist challenged patient on being focused on diagnosis and instead focus on improvement of symptoms.   Plan: Return again in 1 week.2.Read handout from trauma workbook of ways to handle nightmares  Diagnosis: Axis I: Bipolar, mixed    Axis II: n/a    Orel Hord A, LCSW 11/12/2015

## 2015-11-13 ENCOUNTER — Ambulatory Visit (INDEPENDENT_AMBULATORY_CARE_PROVIDER_SITE_OTHER): Payer: BLUE CROSS/BLUE SHIELD | Admitting: Family Medicine

## 2015-11-13 ENCOUNTER — Encounter: Payer: Self-pay | Admitting: Family Medicine

## 2015-11-13 VITALS — BP 111/67 | Ht 66.5 in | Wt 237.0 lb

## 2015-11-13 DIAGNOSIS — M79662 Pain in left lower leg: Secondary | ICD-10-CM | POA: Diagnosis not present

## 2015-11-13 NOTE — Assessment & Plan Note (Signed)
TTP at medial head of gastroc w/ negative S/Sx of DVT.  Continue conservative management and recommend sleeve to help with compression.  Heel lifts optional.  F/U PRN.

## 2015-11-13 NOTE — Progress Notes (Signed)
  Colleen Peck - 35 y.o. female MRN [id number]  Date of birth: [birth year] Colleen Peck is a 35 y.o. female who presents today for left calf pain.  L calf pain - Pt with 3 weeks of ongoing L calf pain, medially.  Started when exerting herself and felt acute tearing sensation in calf.  Saw orthopedist in [place] and recommended relative rest and dx with calf strain.  Started getting better and saw PCP 3-4 days ago after re aggravated it stepping down stairs.  Continues to hurt and has not tried much other than occasional PRN ibuprofen.  Denies swelling, SOB, CP, erythema or foot pain.     PMHx - Updated and reviewed.  Contributory factors include: Bipolar Disorder  PSHx - Updated and reviewed.  Contributory factors include:  D&C FHx - Updated and reviewed.  Contributory factors include:  Bipolar  Social Hx - Updated and reviewed. Contributory factors include: Non smoker   Medications - Updated and reviewed    ROS Per HPI.  12 point negative other than per HPI.   Exam:  Filed Vitals:   11/13/15 1050  BP: 111/67   Gen: NAD, AAO 3 Cardio- RRR Pulm - Normal respiratory effort/rate Skin: No rashes or erythema Extremities: No edema  Vascular: pulses +2 bilateral upper and lower extremity Psych: Normal affect  LLE: No erythema, edema, ecchymosis.  Negative Homan sign.  Neurovascular intact.  TTP medial head gastroc with no evident edema or ecchymosis.    Imaging:  None

## 2015-11-17 ENCOUNTER — Ambulatory Visit: Payer: BLUE CROSS/BLUE SHIELD | Admitting: Psychiatry

## 2015-11-18 ENCOUNTER — Ambulatory Visit: Payer: BLUE CROSS/BLUE SHIELD | Admitting: Licensed Clinical Social Worker

## 2015-11-19 ENCOUNTER — Ambulatory Visit (INDEPENDENT_AMBULATORY_CARE_PROVIDER_SITE_OTHER): Payer: BLUE CROSS/BLUE SHIELD | Admitting: Psychiatry

## 2015-11-19 ENCOUNTER — Encounter: Payer: Self-pay | Admitting: Psychiatry

## 2015-11-19 ENCOUNTER — Ambulatory Visit (INDEPENDENT_AMBULATORY_CARE_PROVIDER_SITE_OTHER): Payer: BLUE CROSS/BLUE SHIELD | Admitting: Licensed Clinical Social Worker

## 2015-11-19 ENCOUNTER — Telehealth: Payer: Self-pay | Admitting: Psychiatry

## 2015-11-19 VITALS — BP 122/86 | HR 86 | Temp 97.5°F | Ht 66.5 in | Wt 234.4 lb

## 2015-11-19 DIAGNOSIS — F603 Borderline personality disorder: Secondary | ICD-10-CM | POA: Diagnosis not present

## 2015-11-19 DIAGNOSIS — F250 Schizoaffective disorder, bipolar type: Secondary | ICD-10-CM | POA: Diagnosis not present

## 2015-11-19 MED ORDER — BREXPIPRAZOLE 0.5 MG PO TABS: 0.5000 mg | ORAL_TABLET | Freq: Every day | ORAL | Status: DC

## 2015-11-19 MED ORDER — FLUOXETINE HCL 20 MG PO TABS: 20.0000 mg | ORAL_TABLET | Freq: Every day | ORAL | Status: DC

## 2015-11-19 NOTE — Progress Notes (Signed)
   THERAPIST PROGRESS NOTE  Session Time: 11:10 AM-12:00 PM  Participation Level: Active  Behavioral Response: CasualAlertDysphoric  Type of Therapy: Individual Therapy  Treatment Goals addressed: Coping and Diagnosis: Bipolar Disorder, most recent episode mixed, reduction in psychiatric symptoms, work on establishing balance, use supports to help her get insight to manage stressors, problem solving and then take plan and put into action.   Interventions: Supportive and Other: Establishing Therapeutic Rapport  Summary: Colleen Peck is a 35 y.o. female who presents with stating that the medications are not helping but that she has been taking them. She describes symptoms of feeling sad, worry and apprehension related to leaving the house and concerns about her family. Therapist attempts to explore but patient does not want to go into more detail and open up to therapist. She describes things happening in the hospital, that she was not herself and did things that were not her but again does not want to elaborate to therapist. She is concerned about what is going to go in record and says she does not want to be misdiagnosed. She also raises concerns at times that therapist is not accurately hearing what she is saying.  She related that she is having nightmares and therapist said that apprehension can relate to PTSD symptoms and patient says she she has been diagnosed this in the past. Discussed how work on trauma would be a good focus on treatment but that without patient opening up that therapist not able to know what is going on to help patient address symptoms and issues. Therapist continues to work on building therapeutic rapport but patient remains mistrustful and not comfortable opening up. She remains elusive so that it is difficult to accurately assess full clinical picture.   Suicidal/Homicidal: No  Therapist Response: Therapist worked on Programmer, systems. Therapist continues  to explore patient's symptoms to get an accurate assessment of what is going on and how best to address symptoms although this is difficult as patient is remains guarded and resistant. Therapist encoruaged patient to see that being on the right medications would be helpful to treat symptoms along with therapy. Discussed based on what patient was telling her that PTSD would be one of the things that would be helpful to focus on. Discussed how trauma can distort one's schemas and that could lead to feelings of basic mistrust of the work and hyperarousal and apprehension. Discussed that it is important to gain full knowledge of symptoms so as to be able to treat all symptoms effectively. Pointed out that paranoia seems to be one of the symptoms that needs addressed and that this would be helpful both with therapy and medication.  . Plan: Return again in 1 week.2.Patient gain insight to healthy coping strategies to manage symptoms.  Diagnosis: Axis I: Bipolar, mixed    Axis II: n/a    Bowman,Mary A, LCSW 11/19/2015

## 2015-11-19 NOTE — Progress Notes (Signed)
Patient ID: Colleen Peck, female   DOB: Nov 22, 1980, 35 y.o.   MRN: [id number] [facility] MD/PA/NP OP Progress Note  11/19/2015 12:25 PM [name]  MRN:  [id number]  Chief Complaint:  Chief Complaint    Follow-up; Medication Refill      HPI: Patient is a 35 yo AAF with Schizoaffective disorder and borderline personality disorder presented for follow-up appointment.  She was evaluated in the presence of the therapists. She remains paranoid and reported that she is not doing well on her medications. She reported that she becomes more depressed after she takes her Risperdal and other medications. She reported that she does not want to take her medications as prescribed. She continues to complain about her medications. She reported that she has not started going back to work as they are waiting on the phone call from our office. Patient reported that all the medications are affecting her negatively. Her therapist also reported that she remains suspicious and paranoid during the therapy sessions and has not been getting any benefit from the same. We discussed at length about her medications and she reported that she wants to start taking the Prozac again. She remains skeptical about the use of the mood stabilizers at this time. However she stated that she is compliant with her medications. She currently denied having any suicidal ideations or plans     Visit Diagnosis:    ICD-9-CM ICD-10-CM   1. Schizoaffective disorder, bipolar type ([place]) 295.70 F25.0   2. Borderline personality disorder 301.83 F60.3     Past Psychiatric History: Patient has a history of PTSD and anxiety and the multiple hospitalizations more so in the last 6 weeks she has had 2 hospitalizations. She reports being very overwhelmed.  Past Medical History:  Past Medical History  Diagnosis Date  . Asthma   . Seasonal allergies   . Depression   . PTSD (post-traumatic stress disorder)   . Thyroid disease 2009    Graves  disease (pt reported resolved); hypothyriodism  . Abnormal pap     pt reports abnl pap many years ago.  Nl since then.  . Palpitations 03/12/2008  . Bipolar disorder [facility])     Past Surgical History  Procedure Laterality Date  . Dilation and curettage of uterus  March 2006    Family Psychiatric History:   Family History:  Family History  Problem Relation Age of Onset  . Drug abuse Father   . Depression Maternal Aunt   . Depression Maternal Grandmother   . Anxiety disorder Maternal Grandmother   . COPD Maternal Grandmother   . Suicidality Cousin   . Depression Cousin   . Bipolar disorder Cousin   . Depression Maternal Aunt   . Hypertension Mother   . Depression Mother   . Diabetes Paternal Grandfather   . COPD Paternal Grandmother   . Heart disease Neg Hx     Social History:  Social History   Social History  . Marital Status: Single    Spouse Name: N/A  . Number of Children: N/A  . Years of Education: N/A   Social History Main Topics  . Smoking status: Never Smoker   . Smokeless tobacco: Never Used  . Alcohol Use: No     Comment: past use of alcohol in '08-'09  . Drug Use: No     Comment: past use of marijuana in '08-'09. occasional eats brownies w/ marijuana  before thanksgiving  . Sexual Activity: Not Currently    Birth Control/ Protection: Abstinence  Other Topics Concern  . 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    Allergies:  Allergies  Allergen Reactions  . Abilify [Aripiprazole] Other (See Comments)    AKATHISIA    Metabolic Disorder Labs: Lab Results  Component Value Date   HGBA1C 6.1* 08/21/2015   MPG 128 08/21/2015   Lab Results  Component Value Date   PROLACTIN 8.8 10/05/2015   PROLACTIN 27.1* 08/21/2015   Lab Results  Component Value Date   CHOL 150 10/05/2015   TRIG 192* 10/05/2015   HDL 32* 10/05/2015   CHOLHDL 4.7 10/05/2015   VLDL 38  10/05/2015   LDLCALC 80 10/05/2015   LDLCALC 122* 08/21/2015     Current Medications: Current Outpatient Prescriptions  Medication Sig Dispense Refill  . benztropine (COGENTIN) 0.5 MG tablet Take 1 tablet (0.5 mg total) by mouth 2 (two) times daily. 60 tablet 2  . diclofenac (VOLTAREN) 75 MG EC tablet Take 1 tablet (75 mg total) by mouth 2 (two) times daily as needed for mild pain or moderate pain. 14 tablet 0  . ferrous sulfate 325 (65 FE) MG tablet Take 1 tablet (325 mg total) by mouth daily with breakfast. 30 tablet 0  . levothyroxine (SYNTHROID, LEVOTHROID) 112 MCG tablet Take 2 tablets (224 mcg total) by mouth daily before breakfast. 60 tablet 0  . OXcarbazepine (TRILEPTAL) 150 MG tablet Take 2 tablets (300 mg total) by mouth 2 (two) times daily. 60 tablet 1  . polyethylene glycol powder (GLYCOLAX/MIRALAX) powder Take 17 g by mouth 2 (two) times daily as needed. 3350 g 1  . risperiDONE (RISPERDAL) 0.5 MG tablet Take 1 tablet (0.5 mg total) by mouth 2 (two) times daily. 60 tablet 2  . traZODone (DESYREL) 100 MG tablet Take 2 tablets (200 mg total) by mouth at bedtime. 60 tablet 1   No current facility-administered medications for this visit.    Neurologic: Headache: No Seizure: No Paresthesias: No  Musculoskeletal: Strength & Muscle Tone: within normal limits Gait & Station: normal Patient leans: N/A  Psychiatric Specialty Exam: ROS   Blood pressure 122/86, pulse 86, temperature 97.5 F (36.4 C), temperature source Tympanic, height 5' 6.5" (1.689 m), weight 234 lb 6.4 oz (106.323 kg), last menstrual period 10/29/2015, SpO2 96 %.Body mass index is 37.27 kg/(m^2).  General Appearance: Casual  Eye Contact:  Fair  Speech:  Clear and Coherent  Volume:  Normal  Mood:  Improved   Affect:  Tearful  Thought Process:  Disorganized  Orientation:  Full (Time, Place, and Person)  Thought Content:  Hallucinations: Auditory Visual  Suicidal Thoughts:  No  Homicidal Thoughts:  No   Memory:  Immediate;   Fair Recent;   Fair Remote;   Fair  Judgement:  Fair  Insight:  Fair  Psychomotor Activity:  Normal  Concentration:  Fair  Recall:  [employer] of Knowledge: Fair  Language: Fair  Akathisia:  No  Handed:  Right  AIMS (if indicated):    Assets:  Communication Skills Desire for Improvement Resilience Social Support Vocational/Educational  ADL's:  Intact  Cognition: WNL  Sleep:  good     Treatment Plan Summary:Medication management   I will discontinue the risperidone and start her on Rexulti 0.5 milligrams by mouth daily Continue  Trileptal 300 mg by mouth twice daily Trazodone  200 mg by mouth daily at bedtime She will be started on Prozac 20 mg every  morning to help with her depression  PTSD Continue therapy to address her trauma   Continue to monitor Follow-up in 2 weeks    More than 50% of the time spent in psychoeducation, counseling and coordination of care.    This note was generated in part or whole with voice recognition software. Voice regonition is usually quite accurate but there are transcription errors that can and very often do occur. I apologize for any typographical errors that were not detected and corrected.    Rainey Pines, MD 11/19/2015, 12:25 PM

## 2015-11-25 ENCOUNTER — Emergency Department (HOSPITAL_COMMUNITY)
Admission: EM | Admit: 2015-11-25 | Discharge: 2015-11-27 | Disposition: A | Discharge status: Home / Self Care | Payer: BLUE CROSS/BLUE SHIELD | Attending: Emergency Medicine | Admitting: Emergency Medicine

## 2015-11-25 ENCOUNTER — Encounter (HOSPITAL_COMMUNITY): Payer: Self-pay

## 2015-11-25 DIAGNOSIS — F4310 Post-traumatic stress disorder, unspecified: Secondary | ICD-10-CM | POA: Diagnosis present

## 2015-11-25 DIAGNOSIS — F250 Schizoaffective disorder, bipolar type: Secondary | ICD-10-CM | POA: Diagnosis present

## 2015-11-25 DIAGNOSIS — J45909 Unspecified asthma, uncomplicated: Secondary | ICD-10-CM | POA: Insufficient documentation

## 2015-11-25 DIAGNOSIS — F4312 Post-traumatic stress disorder, chronic: Secondary | ICD-10-CM | POA: Diagnosis present

## 2015-11-25 DIAGNOSIS — E039 Hypothyroidism, unspecified: Secondary | ICD-10-CM | POA: Insufficient documentation

## 2015-11-25 DIAGNOSIS — F419 Anxiety disorder, unspecified: Secondary | ICD-10-CM | POA: Insufficient documentation

## 2015-11-25 LAB — CBC WITH DIFFERENTIAL/PLATELET
Basophils Absolute: 0 10*3/uL (ref 0.0–0.1)
Basophils Relative: 0 %
Eosinophils Absolute: 0.2 10*3/uL (ref 0.0–0.7)
Eosinophils Relative: 2 %
HCT: 37 % (ref 36.0–46.0)
Hemoglobin: 12 g/dL (ref 12.0–15.0)
Lymphocytes Relative: 38 %
Lymphs Abs: 3.1 10*3/uL (ref 0.7–4.0)
MCH: 26.6 pg (ref 26.0–34.0)
MCHC: 32.4 g/dL (ref 30.0–36.0)
MCV: 82 fL (ref 78.0–100.0)
Monocytes Absolute: 0.4 10*3/uL (ref 0.1–1.0)
Monocytes Relative: 5 %
Neutro Abs: 4.3 10*3/uL (ref 1.7–7.7)
Neutrophils Relative %: 55 %
Platelets: 354 10*3/uL (ref 150–400)
RBC: 4.51 MIL/uL (ref 3.87–5.11)
RDW: 13.6 % (ref 11.5–15.5)
WBC: 8 10*3/uL (ref 4.0–10.5)

## 2015-11-25 LAB — COMPREHENSIVE METABOLIC PANEL
ALT: 21 U/L (ref 14–54)
AST: 17 U/L (ref 15–41)
Albumin: 4.6 g/dL (ref 3.5–5.0)
Alkaline Phosphatase: 64 U/L (ref 38–126)
Anion gap: 11 (ref 5–15)
BUN: 8 mg/dL (ref 6–20)
CO2: 24 mmol/L (ref 22–32)
Calcium: 10.3 mg/dL (ref 8.9–10.3)
Chloride: 104 mmol/L (ref 101–111)
Creatinine, Ser: 0.82 mg/dL (ref 0.44–1.00)
GFR calc Af Amer: >60 mL/min (ref >60)
GFR calc non Af Amer: >60 mL/min (ref >60)
Glucose, Bld: 98 mg/dL (ref 65–99)
Potassium: 3.7 mmol/L (ref 3.5–5.1)
Sodium: 139 mmol/L (ref 135–145)
Total Bilirubin: 0.4 mg/dL (ref 0.3–1.2)
Total Protein: 8.1 g/dL (ref 6.5–8.1)

## 2015-11-25 LAB — RAPID URINE DRUG SCREEN, HOSP PERFORMED
Amphetamines: NOT DETECTED
Barbiturates: NOT DETECTED
Benzodiazepines: POSITIVE — AB
Cocaine: NOT DETECTED
Opiates: NOT DETECTED
Tetrahydrocannabinol: NOT DETECTED

## 2015-11-25 LAB — ETHANOL: Alcohol, Ethyl (B): <5 mg/dL (ref <5)

## 2015-11-25 MED ORDER — DICLOFENAC SODIUM 75 MG PO TBEC
75.0000 mg | DELAYED_RELEASE_TABLET | Freq: Two times a day (BID) | ORAL | Status: DC | PRN
  Filled 2015-11-25: qty 1

## 2015-11-25 MED ORDER — BREXPIPRAZOLE 0.5 MG PO TABS
0.5000 mg | ORAL_TABLET | Freq: Every day | ORAL | Status: DC
  Administered 2015-11-25: 0.5 mg via ORAL

## 2015-11-25 MED ORDER — POLYETHYLENE GLYCOL 3350 17 GM/SCOOP PO POWD
17.0000 g | Freq: Two times a day (BID) | ORAL | Status: DC | PRN
  Filled 2015-11-25: qty 255

## 2015-11-25 MED ORDER — BENZTROPINE MESYLATE 1 MG PO TABS
0.5000 mg | ORAL_TABLET | Freq: Two times a day (BID) | ORAL | Status: DC
  Administered 2015-11-25 – 2015-11-27 (×4): 0.5 mg via ORAL
  Filled 2015-11-25 (×4): qty 1

## 2015-11-25 MED ORDER — RISPERIDONE 0.5 MG PO TABS
0.5000 mg | ORAL_TABLET | Freq: Two times a day (BID) | ORAL | Status: DC
  Administered 2015-11-25 – 2015-11-26 (×2): 0.5 mg via ORAL
  Filled 2015-11-25 (×2): qty 1

## 2015-11-25 MED ORDER — FLUOXETINE HCL 20 MG PO TABS
20.0000 mg | ORAL_TABLET | Freq: Every day | ORAL | Status: DC
  Administered 2015-11-26 – 2015-11-27 (×2): 20 mg via ORAL
  Filled 2015-11-25 (×4): qty 1

## 2015-11-25 MED ORDER — TRAZODONE HCL 100 MG PO TABS
100.0000 mg | ORAL_TABLET | Freq: Every day | ORAL | Status: DC
  Administered 2015-11-25 – 2015-11-26 (×2): 100 mg via ORAL
  Filled 2015-11-25 (×2): qty 1

## 2015-11-25 MED ORDER — FERROUS SULFATE 325 (65 FE) MG PO TABS
325.0000 mg | ORAL_TABLET | Freq: Every day | ORAL | Status: DC
  Administered 2015-11-26 – 2015-11-27 (×2): 325 mg via ORAL
  Filled 2015-11-25 (×3): qty 1

## 2015-11-25 MED ORDER — DIAZEPAM 5 MG PO TABS
5.0000 mg | ORAL_TABLET | Freq: Once | ORAL | Status: AC
  Administered 2015-11-25: 5 mg via ORAL
  Filled 2015-11-25: qty 1

## 2015-11-25 MED ORDER — LEVOTHYROXINE SODIUM 112 MCG PO TABS
224.0000 ug | ORAL_TABLET | Freq: Every day | ORAL | Status: DC
  Administered 2015-11-26 – 2015-11-27 (×2): 224 ug via ORAL
  Filled 2015-11-25 (×3): qty 2

## 2015-11-25 MED ORDER — HYDROXYZINE HCL 25 MG PO TABS
25.0000 mg | ORAL_TABLET | Freq: Four times a day (QID) | ORAL | Status: DC | PRN
  Administered 2015-11-26: 25 mg via ORAL
  Filled 2015-11-25: qty 1

## 2015-11-25 MED ORDER — OXCARBAZEPINE 300 MG PO TABS
300.0000 mg | ORAL_TABLET | Freq: Two times a day (BID) | ORAL | Status: DC
  Administered 2015-11-25 – 2015-11-27 (×4): 300 mg via ORAL
  Filled 2015-11-25 (×4): qty 1

## 2015-11-25 MED ORDER — POLYETHYLENE GLYCOL 3350 17 G PO PACK
17.0000 g | PACK | Freq: Two times a day (BID) | ORAL | Status: DC | PRN
  Filled 2015-11-25: qty 1

## 2015-11-25 NOTE — ED Notes (Signed)
Family member dropped off ALL of pt's meds & left.  Called Crystal in pharmacy & informed.  Crystal is coming to p/u all meds & take back to pharmacy.

## 2015-11-25 NOTE — ED Notes (Signed)
Pt informed urine specimen needed.  Pt stated "I need some wipes because I'm on my period."  Pt also provided with feminine pads.
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
## 2015-11-25 NOTE — ED Notes (Signed)
Pt stated "I saw my therapist last week & I told her about the nightmares & really bad anxiety.  I wake up crying."  She just told me it was PTSD.

## 2015-11-25 NOTE — ED Notes (Signed)
Spoke to [employer], [employer].  Crystal stated pharmacy doesn't have the Brexpiprazole & pt will need to have a family member to bring in.  Pt informed, phone provided to make call & request med be brought in.

## 2015-11-25 NOTE — ED Provider Notes (Signed)
CSN: [id number]     Arrival date & time 11/25/15  1709 History   First MD Initiated Contact with Patient 11/25/15 1837     Chief Complaint  Patient presents with  . Anxiety     (Consider location/radiation/quality/duration/timing/severity/associated sxs/prior Treatment) HPI   35 year old female severe anxiety. She is presenting with her sister. Patient reports that she has had past "trauma" resulting PTSD. She is not sure what she's been increasingly anxious recently. Increasing nightmares. Difficulty sleeping. Her sister is bedside reports that she has talked about dying. Patient will answer questions when directly asked that she's been having thoughts of wanting hurt to herself or anyone else. She is crying frequently and rocking back and forth.  Past Medical History  Diagnosis Date  . Asthma   . Seasonal allergies   . Depression   . PTSD (post-traumatic stress disorder)   . Thyroid disease 2009    Graves disease (pt reported resolved); hypothyriodism  . Abnormal pap     pt reports abnl pap many years ago.  Nl since then.  . Palpitations 03/12/2008  . Bipolar disorder [facility])    Past Surgical History  Procedure Laterality Date  . Dilation and curettage of uterus  March 2006   Family History  Problem Relation Age of Onset  . Drug abuse Father   . Depression Maternal Aunt   . Depression Maternal Grandmother   . Anxiety disorder Maternal Grandmother   . COPD Maternal Grandmother   . Suicidality Cousin   . Depression Cousin   . Bipolar disorder Cousin   . Depression Maternal Aunt   . Hypertension Mother   . Depression Mother   . Diabetes Paternal Grandfather   . COPD Paternal Grandmother   . Heart disease Neg Hx    Social History  Substance Use Topics  . Smoking status: Never Smoker   . Smokeless tobacco: Never Used  . Alcohol Use: No     Comment: past use of alcohol in '08-'09   OB History    No data available     Review of Systems  All systems reviewed and  negative, other than as noted in HPI.   Allergies  Abilify  Home Medications   Prior to Admission medications   Medication Sig Start Date End Date Taking? Authorizing Provider  benztropine (COGENTIN) 0.5 MG tablet Take 1 tablet (0.5 mg total) by mouth 2 (two) times daily. 11/12/15 11/11/16 Yes Rainey Pines, MD  Brexpiprazole (REXULTI) 0.5 MG TABS Take 0.5 mg by mouth daily after supper. 11/19/15  Yes Rainey Pines, MD  diclofenac (VOLTAREN) 75 MG EC tablet Take 1 tablet (75 mg total) by mouth 2 (two) times daily as needed for mild pain or moderate pain. 10/06/15  Yes Kerrie Buffalo, NP  ferrous sulfate 325 (65 FE) MG tablet Take 1 tablet (325 mg total) by mouth daily with breakfast. 10/06/15  Yes Kerrie Buffalo, NP  FLUoxetine (PROZAC) 20 MG tablet Take 1 tablet (20 mg total) by mouth daily. 11/19/15  Yes Rainey Pines, MD  levothyroxine (SYNTHROID, LEVOTHROID) 112 MCG tablet Take 2 tablets (224 mcg total) by mouth daily before breakfast. 10/06/15  Yes Kerrie Buffalo, NP  OXcarbazepine (TRILEPTAL) 150 MG tablet Take 2 tablets (300 mg total) by mouth 2 (two) times daily. 11/12/15  Yes Rainey Pines, MD  traZODone (DESYREL) 100 MG tablet Take 2 tablets (200 mg total) by mouth at bedtime. Patient taking differently: Take 100 mg by mouth at bedtime.  11/12/15  Yes Rainey Pines, MD  hydrOXYzine (ATARAX/VISTARIL) 25 MG tablet Take 25 mg by mouth every 6 (six) hours as needed for anxiety or itching.  10/25/15   Historical Provider, MD  polyethylene glycol powder (GLYCOLAX/MIRALAX) powder Take 17 g by mouth 2 (two) times daily as needed. 10/10/15   Ashly Windell Moulding, DO  risperiDONE (RISPERDAL) 0.5 MG tablet Take 0.5 mg by mouth 2 (two) times daily.  11/17/15   Historical Provider, MD   BP 108/65 mmHg  Pulse 73  Temp(Src) 98 F (36.7 C) (Oral)  Resp 18  SpO2 99%  LMP 10/29/2015 Physical Exam  Constitutional: She appears well-developed and well-nourished. No distress.  HENT:  Head: Normocephalic and atraumatic.   Eyes: Conjunctivae are normal. Right eye exhibits no discharge. Left eye exhibits no discharge.  Neck: Neck supple.  Cardiovascular: Normal rate, regular rhythm and normal heart sounds.  Exam reveals no gallop and no friction rub.   No murmur heard. Pulmonary/Chest: Effort normal and breath sounds normal. No respiratory distress.  Abdominal: Soft. She exhibits no distension. There is no tenderness.  Musculoskeletal: She exhibits no edema or tenderness.  Neurological: She is alert.  Skin: Skin is warm and dry.  Psychiatric:  Extremely anxious. Crying frequently. Rocking and shaking at times.  Nursing note and vitals reviewed.   ED Course  Procedures (including critical care time) Labs Review Labs Reviewed  COMPREHENSIVE METABOLIC PANEL  ETHANOL  CBC WITH DIFFERENTIAL/PLATELET  URINE RAPID DRUG SCREEN, HOSP PERFORMED    Imaging Review No results found. I have personally reviewed and evaluated these images and lab results as part of my medical decision-making.   EKG Interpretation None      MDM   Final diagnoses:  Anxiety    35 year old female with severe anxiety to the point that it interferes with her daily activities. She denies suicidal or homicidal ideation. Medically cleared. Psychiatric evaluation.    Virgel Manifold, MD 11/25/15 2308

## 2015-11-25 NOTE — ED Notes (Signed)
Pt presents with c/o anxiety. Pt reports that she dealt with some traumas in her life that she needs to deal with. Pt denies any thoughts of being SI or HI. Family at bedside reports that pt has thoughts about death. Pt starts to cry when she hears this and says that she does not want to elaborate on this because she does not want this information in her record. Pt is very tearful and is rocking back and forth in the triage chair.

## 2015-11-25 NOTE — BH Assessment (Signed)
Assessment completed. Consulted Patriciaann Clan, PA-C who agrees that pt meets inpatient criteria. TTS to seek placement. Informed Dr. Wilson Singer of the recommendation.

## 2015-11-26 ENCOUNTER — Ambulatory Visit: Payer: Self-pay | Admitting: Endocrinology

## 2015-11-26 ENCOUNTER — Inpatient Hospital Stay (HOSPITAL_COMMUNITY): Admission: AD | Admit: 2015-11-26 | Payer: BLUE CROSS/BLUE SHIELD | Source: Intra-hospital | Admitting: Psychiatry

## 2015-11-26 ENCOUNTER — Telehealth: Payer: Self-pay | Admitting: Endocrinology

## 2015-11-26 DIAGNOSIS — F250 Schizoaffective disorder, bipolar type: Secondary | ICD-10-CM | POA: Diagnosis present

## 2015-11-26 DIAGNOSIS — F419 Anxiety disorder, unspecified: Secondary | ICD-10-CM | POA: Diagnosis not present

## 2015-11-26 DIAGNOSIS — F4310 Post-traumatic stress disorder, unspecified: Secondary | ICD-10-CM

## 2015-11-26 DIAGNOSIS — Z0289 Encounter for other administrative examinations: Secondary | ICD-10-CM

## 2015-11-26 MED ORDER — FLUPHENAZINE HCL 2.5 MG PO TABS: 2.5000 mg | ORAL_TABLET | Freq: Every day | ORAL | Status: DC

## 2015-11-26 MED ORDER — LORAZEPAM 0.5 MG PO TABS
0.5000 mg | ORAL_TABLET | Freq: Four times a day (QID) | ORAL | Status: DC | PRN
  Administered 2015-11-26 – 2015-11-27 (×3): 0.5 mg via ORAL
  Filled 2015-11-26 (×3): qty 1

## 2015-11-26 MED ORDER — FLUPHENAZINE HCL 2.5 MG PO TABS
2.5000 mg | ORAL_TABLET | Freq: Two times a day (BID) | ORAL | Status: DC
  Filled 2015-11-26 (×5): qty 1
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
## 2015-11-26 NOTE — Telephone Encounter (Signed)
Patient no showed today's appt. Please advise on how to follow up. °A. No follow up necessary. °B. Follow up urgent. Contact patient immediately. °C. Follow up necessary. Contact patient and schedule visit in ___ days. °D. Follow up advised. Contact patient and schedule visit in ____weeks. ° °
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
## 2015-11-27 ENCOUNTER — Ambulatory Visit: Payer: BLUE CROSS/BLUE SHIELD | Admitting: Psychiatry

## 2015-11-27 NOTE — Discharge Instructions (Addendum)
For your ongoing behavioral health needs, you are advised to continue treatment with your current outpatient providers, Dr Gretel Acre and Royal Piedra, LCSW at [place]:       [facility]      [address]      [place], [place] [address]      [phone]  If you have a behavioral health crisis, contact the [facility] Mobile Crisis Team.  They are available 24 hours a day, 7 days a week, and they can come to your home:       [facility]      [phone]

## 2015-11-27 NOTE — ED Notes (Addendum)
Ready to discharge Pt when she stated that she wanted to stay and be admitted to [facility] for treatment. Knowing that pt denied placement yesterday, the writer explained that she is back in waiting list and that the bed that was offered to her has propably been offered to another pt after she denied it. Reginold Agent, NP made aware.

## 2015-11-27 NOTE — BH Assessment (Signed)
[place] Assessment Progress Note  As pt was being prepared for discharge, she reports that she wants to be hospitalized for psychiatric treatment.  This Probation officer spoke to both the pt and her sister in the pt's room.  Pt reports that she only wants to be hospitalized because her sister wants her to be.  Pt and sister both deny that pt has expressed any recent suicidal or homicidal thoughts.  The sister reports that she is concerned about pt's safety because of perceived erratic behavior, but is not able to specify any life threatening erratic behavior, even with prompted with cues such as running into traffic.  The sister reports that the pt recently drove out of town, saying that she was going to [place] when in fact she was going to [place].  Pt has family in both communities, and a family member drove her back home.  Pt has a driver's license and access to a car, and there is no report that the pt drives recklessly.  A variety of outpatient treatment options were discussed with the pt, including the [facility] Hospital[facility] Program ([facility]) at [facility], [place] [facility] (MH-IOP) at [facility], and returning to her regular outpatient providers at [facility].  Pt reports that she cannot commit to PHP due to the drive to [facility].  She is interested MH-IOP, but after speaking the Dellia Nims, MEd, it was found that she is not eligible to return at this time due to her recent participation in the program.  Discharge instructions therefore advise pt to follow up with Dr Gretel Acre and with Royal Piedra, LCSW, both at [facility].  She is advised to contact them at her earliest opportunity to schedule a visit.  Discharge instructions also provide pt with contact information for the [facility] Team, noting that they are available 24 hours a day, 7 days a week.  Pt and sister were both  provided with Mobile Crisis business cards.  The sister was advised about how to initiate an IVC if she finds it to be necessary.  Charmaine Downs, NP concurs with these recommendations.  Pt's nurse has been notified.  Jalene Mullet, [place] Triage Specialist [phone]

## 2015-11-27 NOTE — Consult Note (Signed)
Psychiatric Specialty Exam: Physical Exam  ROS  Blood pressure 108/78, pulse 72, temperature 98 F (36.7 C), temperature source Oral, resp. rate 20, last menstrual period 10/29/2015, SpO2 95 %.There is no weight on file to calculate BMI.  General Appearance: Casual  Eye Contact::  Good  Speech:  Clear and Coherent and Normal Rate  Volume:  Normal  Mood:  Anxious and Depressed  Affect:  Congruent  Thought Process:  Coherent, Goal Directed and Intact  Orientation:  Full (Time, Place, and Person)  Thought Content:  WDL  Suicidal Thoughts:  No  Homicidal Thoughts:  No  Memory:  Immediate;   Good Recent;   Good Remote;   Good  Judgement:  Fair  Insight:  Shallow  Psychomotor Activity:  Normal  Concentration:  Good  Recall:  Good  Fund of Knowledge:  Fair  Language:  Good  Akathisia:  No  Handed:  Right  AIMS (if indicated):     Assets:  Desire for Improvement  ADL's:  Intact  Cognition:  WNL  Sleep:      Patient declined admission yesterday to [facility] for stabilization and medication management.  Today she has asked to be discharged home to follow up with her out patient Psychiatrist DR Cranston Neighbor.  Patient states she is stable enough to go see her outpatient Psychiatrist for medication management.  She reports less anxiety and panic attacks, she slept better last night.  Patient denies SI/HI/AVH.  She is discharged home. Schizoaffective disorder, bipolar type ([place])   Plan:  Discharge home, follow up with DR Nils Flack   PMHNP-BC  ADDENDUM: After discharge patient and her sister who is her POA changed her mind and decided to stay for admission.  Patient states that her anxiety is worse and that she is likely not going to be able to see her outpatient provider.  Patient is now asking for an admission bed.  We will be seeking placement and will seek IVC  Status for the admission.  Charmaine Downs   PMHNP-BC  ADDENDUM:  Patient again declined inpatient hospitalization  after asking for an admission.  Patient has agreed one more time to see her out patient Psychiatrist.  Patient denies SI/HI/AVH.  She reports that her anxiety is manageable until she sees her Psychiatrist.  Patient is discharged.  Charmaine Downs   PMHNP-BC Patient seen face-to-face for psychiatric evaluation, chart reviewed and case discussed with the physician extender and developed treatment plan. Reviewed the information documented and agree with the treatment plan. Corena Pilgrim, MD

## 2015-11-27 NOTE — BHH Suicide Risk Assessment (Signed)
Suicide Risk Assessment  Discharge Assessment   [facility] Discharge Suicide Risk Assessment   Principal Problem: Schizoaffective disorder, bipolar type [facility]) Discharge Diagnoses:  Patient Active Problem List   Diagnosis Date Noted  . Schizoaffective disorder, bipolar type ([place]) [F25.0] 11/26/2015    Priority: Medium  . PTSD (post-traumatic stress disorder) [F43.10] 09/30/2015    Priority: Medium  . Anxiety [F41.9]   . Pain of left calf [M79.662] 11/10/2015  . Bipolar disorder, curr episode mixed, severe, with psychotic features ([place]) [F31.64] 09/30/2015  . Medical clearance for psychiatric admission [Z00.8]   . Paranoia ([place]) [F22]   . Anemia [D64.9] 01/30/2015  . Unspecified vitamin D deficiency [E55.9] 05/22/2013  . Hypothyroidism (acquired) [E03.9] 11/29/2011  . Obesity [E66.9] 09/08/2006  . RHINITIS, ALLERGIC [J30.9] 09/08/2006  . Asthma [J45.909] 09/08/2006  . ECZEMA, ATOPIC DERMATITIS [L20.89] 09/08/2006    Total Time spent with patient: 20 minutes  Musculoskeletal: Strength & Muscle Tone: within normal limits Gait & Station: normal Patient leans: N/A  Psychiatric Specialty Exam:   Blood pressure 108/78, pulse 72, temperature 98 F (36.7 C), temperature source Oral, resp. rate 20, last menstrual period 10/29/2015, SpO2 95 %.There is no weight on file to calculate BMI.  General Appearance: Casual  Eye Contact:: Good  Speech: Clear and Coherent and Normal Rate  Volume: Normal  Mood: Anxious and Depressed  Affect: Congruent  Thought Process: Coherent, Goal Directed and Intact  Orientation: Full (Time, Place, and Person)  Thought Content: WDL  Suicidal Thoughts: No  Homicidal Thoughts: No  Memory: Immediate; Good Recent; Good Remote; Good  Judgement: Fair  Insight: Shallow  Psychomotor Activity: Normal  Concentration: Good  Recall: Good  Fund of Knowledge: Fair  Language: Good  Akathisia: No  Handed: Right  AIMS  (if indicated):    Assets: Desire for Improvement  ADL's: Intact  Cognition: WNL         Mental Status Per Nursing Assessment::   On Admission:     Demographic Factors:  Low socioeconomic status and Unemployed  Loss Factors: NA  Historical Factors: NA  Risk Reduction Factors:   Living with another person, especially a relative  Continued Clinical Symptoms:  Bipolar Disorder:   Depressive phase Schizophrenia:   Paranoid or undifferentiated type  Cognitive Features That Contribute To Risk:  Polarized thinking    Suicide Risk:  Minimal: No identifiable suicidal ideation.  Patients presenting with no risk factors but with morbid ruminations; may be classified as minimal risk based on the severity of the depressive symptoms    Plan Of Care/Follow-up recommendations:  Activity:  As tolerated Diet:  regular  Delfin Gant, NP    PMHNP-BC 11/27/2015, 10:23 AM

## 2015-11-27 NOTE — BH Assessment (Signed)
[place] Assessment Progress Note  Per Corena Pilgrim, MD, this pt does not require psychiatric hospitalization at this time.  She is to be discharged from [facility] with instructions to continue treatment with her current outpatient providers, Dr Gretel Acre and Royal Piedra, LCSW.  This has been included in pt's discharge instructions.  Pt's nurse has been notified.  Jalene Mullet, [place] Triage Specialist [phone]

## 2015-11-27 NOTE — Discharge Instructions (Signed)
For your ongoing behavioral health needs, you are advised to continue treatment with Dr Gretel Acre and with Royal Piedra, LCSW at [place]:       [facility]      [address]      [place], [place] [address]      [phone]

## 2015-11-28 ENCOUNTER — Ambulatory Visit: Payer: No Typology Code available for payment source | Admitting: Psychiatry

## 2015-11-28 ENCOUNTER — Ambulatory Visit (HOSPITAL_COMMUNITY)
Admission: EM | Admit: 2015-11-28 | Discharge: 2015-11-28 | Discharge status: Absent without leave | Payer: BLUE CROSS/BLUE SHIELD

## 2015-12-01 ENCOUNTER — Encounter: Payer: Self-pay | Admitting: *Deleted

## 2015-12-01 NOTE — Telephone Encounter (Signed)
Letter mailed

## 2015-12-02 ENCOUNTER — Ambulatory Visit (INDEPENDENT_AMBULATORY_CARE_PROVIDER_SITE_OTHER): Payer: BLUE CROSS/BLUE SHIELD | Admitting: Licensed Clinical Social Worker

## 2015-12-02 ENCOUNTER — Ambulatory Visit: Payer: Self-pay | Admitting: Psychiatry

## 2015-12-02 ENCOUNTER — Ambulatory Visit (INDEPENDENT_AMBULATORY_CARE_PROVIDER_SITE_OTHER): Payer: BLUE CROSS/BLUE SHIELD | Admitting: Psychiatry

## 2015-12-02 DIAGNOSIS — F603 Borderline personality disorder: Secondary | ICD-10-CM | POA: Diagnosis not present

## 2015-12-02 DIAGNOSIS — F3160 Bipolar disorder, current episode mixed, unspecified: Secondary | ICD-10-CM | POA: Diagnosis not present

## 2015-12-02 DIAGNOSIS — F250 Schizoaffective disorder, bipolar type: Secondary | ICD-10-CM

## 2015-12-02 MED ORDER — OXCARBAZEPINE 150 MG PO TABS: 300.0000 mg | ORAL_TABLET | Freq: Two times a day (BID) | ORAL | Status: DC

## 2015-12-02 NOTE — Progress Notes (Signed)
Patient ID: Colleen Peck, female   DOB: 03/08/1981, 35 y.o.   MRN: [id number] [facility] MD/PA/NP OP Progress Note  12/02/2015 1:31 PM Colleen Peck  MRN:  [id number]  Chief Complaint:    HPI: Patient is a 35 yo AAF with Schizoaffective disorder and borderline personality disorder presented for follow-up appointment accompanied by her sister.  She was evaluated in the presence of the therapist. She remains anxious and was crying throughout the interview. She was recently discharged from the [facility] emergency department where she stayed for almost 2 days. Patient reported that she continues to have anxiety and was given medication for anxiety and was about to be admitted to the hospital but they did not admit her and discharge her to follow up over here. Her sister reported that they did not adjust any of her medications. Patient continues to cry during the interview. She reported that the medications are not helping her. She has medications with her but she remained focus on her diagnosis and was agitated that why she has been diagnosed with schizophrenia and personality  disorder and was arguing about the same It was difficult to redirect the patient during the interview. We discussed about her medications at length and I will start her back on the Risperdal and discontinue the Rexulti at this time. She reported that she is compliant with her medications but it remains skeptical She currently denied having any suicidal ideations or plans     Visit Diagnosis:    ICD-9-CM ICD-10-CM   1. Schizoaffective disorder, bipolar type ([place]) 295.70 F25.0   2. Borderline personality disorder 301.83 F60.3     Past Psychiatric History: Patient has a history of PTSD and anxiety and the multiple hospitalizations more so in the last 6 weeks she has had 2 hospitalizations. She reports being very overwhelmed.  Past Medical History:  Past Medical History  Diagnosis Date  . Asthma   . Seasonal allergies   .  Depression   . PTSD (post-traumatic stress disorder)   . Thyroid disease 2009    Graves disease (pt reported resolved); hypothyriodism  . Abnormal pap     pt reports abnl pap many years ago.  Nl since then.  . Palpitations 03/12/2008  . Bipolar disorder [facility])     Past Surgical History  Procedure Laterality Date  . Dilation and curettage of uterus  March 2006    Family Psychiatric History:   Family History:  Family History  Problem Relation Age of Onset  . Drug abuse Father   . Depression Maternal Aunt   . Depression Maternal Grandmother   . Anxiety disorder Maternal Grandmother   . COPD Maternal Grandmother   . Suicidality Cousin   . Depression Cousin   . Bipolar disorder Cousin   . Depression Maternal Aunt   . Hypertension Mother   . Depression Mother   . Diabetes Paternal Grandfather   . COPD Paternal Grandmother   . Heart disease Neg Hx     Social History:  Social History   Social History  . Marital Status: Single    Spouse Name: N/A  . Number of Children: N/A  . Years of Education: N/A   Social History Main Topics  . Smoking status: Never Smoker   . Smokeless tobacco: Never Used  . Alcohol Use: No     Comment: past use of alcohol in '08-'09  . Drug Use: No     Comment: past use of marijuana in '08-'09. occasional eats brownies w/ marijuana  before thanksgiving  . Sexual Activity: Not Currently    Birth Control/ Protection: Abstinence   Other Topics Concern  . 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    Allergies:  Allergies  Allergen Reactions  . Abilify [Aripiprazole] Other (See Comments)    AKATHISIA    Metabolic Disorder Labs: Lab Results  Component Value Date   HGBA1C 6.1* 08/21/2015   MPG 128 08/21/2015   Lab Results  Component Value Date   PROLACTIN 8.8 10/05/2015   PROLACTIN 27.1* 08/21/2015   Lab Results  Component Value Date   CHOL 150  10/05/2015   TRIG 192* 10/05/2015   HDL 32* 10/05/2015   CHOLHDL 4.7 10/05/2015   VLDL 38 10/05/2015   LDLCALC 80 10/05/2015   LDLCALC 122* 08/21/2015     Current Medications: Current Outpatient Prescriptions  Medication Sig Dispense Refill  . benztropine (COGENTIN) 0.5 MG tablet Take 1 tablet (0.5 mg total) by mouth 2 (two) times daily. 60 tablet 2  . Brexpiprazole (REXULTI) 0.5 MG TABS Take 0.5 mg by mouth daily after supper. 30 tablet 1  . diclofenac (VOLTAREN) 75 MG EC tablet Take 1 tablet (75 mg total) by mouth 2 (two) times daily as needed for mild pain or moderate pain. 14 tablet 0  . ferrous sulfate 325 (65 FE) MG tablet Take 1 tablet (325 mg total) by mouth daily with breakfast. 30 tablet 0  . FLUoxetine (PROZAC) 20 MG tablet Take 1 tablet (20 mg total) by mouth daily. 30 tablet 1  . hydrOXYzine (ATARAX/VISTARIL) 25 MG tablet Take 25 mg by mouth every 6 (six) hours as needed for anxiety or itching.   2  . levothyroxine (SYNTHROID, LEVOTHROID) 112 MCG tablet Take 2 tablets (224 mcg total) by mouth daily before breakfast. 60 tablet 0  . OXcarbazepine (TRILEPTAL) 150 MG tablet Take 2 tablets (300 mg total) by mouth 2 (two) times daily. 60 tablet 1  . polyethylene glycol powder (GLYCOLAX/MIRALAX) powder Take 17 g by mouth 2 (two) times daily as needed. 3350 g 1  . risperiDONE (RISPERDAL) 0.5 MG tablet Take 0.5 mg by mouth 2 (two) times daily.   0  . traZODone (DESYREL) 100 MG tablet Take 2 tablets (200 mg total) by mouth at bedtime. (Patient taking differently: Take 100 mg by mouth at bedtime. ) 60 tablet 1   No current facility-administered medications for this visit.    Neurologic: Headache: No Seizure: No Paresthesias: No  Musculoskeletal: Strength & Muscle Tone: within normal limits Gait & Station: normal Patient leans: N/A  Psychiatric Specialty Exam: ROS   Last menstrual period 10/29/2015.There is no weight on file to calculate BMI.  General Appearance: Casual   Eye Contact:  Fair  Speech:  Pressured  Volume:  Normal  Mood:  Improved   Affect:  Tearful  Thought Process:  Disorganized  Orientation:  Full (Time, Place, and Person)  Thought Content:  Hallucinations: Auditory Visual  Suicidal Thoughts:  No  Homicidal Thoughts:  No  Memory:  Immediate;   Fair Recent;   Fair Remote;   Fair  Judgement:  Fair  Insight:  Fair  Psychomotor Activity:  Normal  Concentration:  Fair  Recall:  [employer] of Knowledge: Fair  Language: Fair  Akathisia:  No  Handed:  Right  AIMS (if indicated):    Assets:  Communication Skills Desire for Improvement Resilience Social Support Vocational/Educational  ADL's:  Intact  Cognition: WNL  Sleep:  good     Treatment Plan Summary:Medication management   Risperdal 1 mg by mouth twice a day Continue  Trileptal 300 mg by mouth twice daily Trazodone  200 mg by mouth daily at bedtime Discontinue Rexulti She will be started on Prozac 20 mg every morning to help with her depression  PTSD Continue therapy to address her trauma   Continue to monitor Follow-up in 1  weeks    More than 50% of the time spent in psychoeducation, counseling and coordination of care.    This note was generated in part or whole with voice recognition software. Voice regonition is usually quite accurate but there are transcription errors that can and very often do occur. I apologize for any typographical errors that were not detected and corrected.    Rainey Pines, MD 12/02/2015, 1:31 PM

## 2015-12-09 ENCOUNTER — Ambulatory Visit (INDEPENDENT_AMBULATORY_CARE_PROVIDER_SITE_OTHER): Payer: BLUE CROSS/BLUE SHIELD | Admitting: Licensed Clinical Social Worker

## 2015-12-09 DIAGNOSIS — F3160 Bipolar disorder, current episode mixed, unspecified: Secondary | ICD-10-CM

## 2015-12-09 NOTE — Progress Notes (Signed)
   THERAPIST PROGRESS NOTE  Session Time: 17min  Participation Level: Active  Behavioral Response: DisheveledAlertAnxious  Type of Therapy: Individual Therapy  Treatment Goals addressed: Anxiety and Coping  Interventions: CBT, Motivational Interviewing, Solution Focused, Supportive, Family Systems and Reframing  Summary: Colleen Peck is a 35 y.o. female who presents with continued symptoms of her diagnosis.  Factors that contribute to client's ongoing depressive symptoms were discussed and include real and perceived feelings of isolation, criticism, rejection, shame and guilt.  Discussion of her current mood and the change in her current symptoms.     Suicidal/Homicidal: Nowithout intent/plan  Therapist Response: LCSW provided Patient with ongoing emotional support and encouragement.  Normalized her feelings.  Commended Patient on her progress and reinforced the importance of client staying focused on her own strengths and resources and resiliency. Processed various strategies for dealing with stressors.    Plan: Return again in 1 weeks.  Diagnosis: Axis I: Depression    Axis II: No diagnosis    Lubertha South, LCSW 12/05/2015

## 2015-12-14 ENCOUNTER — Other Ambulatory Visit: Payer: Self-pay | Admitting: Family Medicine

## 2015-12-15 ENCOUNTER — Ambulatory Visit (INDEPENDENT_AMBULATORY_CARE_PROVIDER_SITE_OTHER): Payer: Self-pay | Admitting: Licensed Clinical Social Worker

## 2015-12-15 DIAGNOSIS — F3160 Bipolar disorder, current episode mixed, unspecified: Secondary | ICD-10-CM

## 2015-12-15 NOTE — Progress Notes (Signed)
   THERAPIST PROGRESS NOTE  Session Time: 94min  Participation Level: Active  Behavioral Response: DisheveledAlertDepressed  Type of Therapy: Individual Therapy  Treatment Goals addressed: Coping and Diagnosis: Bipolar  Interventions: CBT, Motivational Interviewing, Solution Focused, Biofeedback, Supportive, Family Systems and Reframing  Summary: Colleen Peck is a 35 y.o. female who presents with continued symptoms of her diagnosis.  Review of her goals and how she plans to obtain.  Discussion of a higher level of care.  Patient became tearful while discussing.  Review of her hospitalization and her triggers.  Discussion of communication styles and her compliance to medication.  Discussion of her current diagnosis and what that means to her.  Review of her ability to remain safe and her ability to cope with her stressors   Suicidal/Homicidal: Nowithout intent/plan  Therapist Response: LCSW provided Patient with ongoing emotional support and encouragement.  Normalized her feelings.  Commended Patient on her progress and reinforced the importance of client staying focused on her own strengths and resources and resiliency. Processed various strategies for dealing with stressors.    Plan: Return again in 1 weeks.  Diagnosis: Axis I: Bipolar, mixed    Axis II: No diagnosis    Lubertha South, LCSW 12/09/2015

## 2015-12-16 ENCOUNTER — Ambulatory Visit (INDEPENDENT_AMBULATORY_CARE_PROVIDER_SITE_OTHER): Payer: Self-pay | Admitting: Family Medicine

## 2015-12-16 ENCOUNTER — Encounter: Payer: Self-pay | Admitting: Family Medicine

## 2015-12-16 ENCOUNTER — Ambulatory Visit: Payer: Self-pay

## 2015-12-16 VITALS — BP 99/71 | HR 83 | Temp 98.5°F | Wt 229.0 lb

## 2015-12-16 DIAGNOSIS — G9001 Carotid sinus syncope: Secondary | ICD-10-CM | POA: Insufficient documentation

## 2015-12-16 NOTE — Progress Notes (Addendum)
Subjective Jayline Munyon is a 35 y.o. female with a history of Graves' disease s/p radioablation in 2011 now on synthroid her for neck pain.  She reports about 1 week of right neck discomfort and she is concerned about her thyroid. She was recently hospitalized at [facility] (declines to talk about details of this at all) for 3 days, during which she did not get synthroid. She was told that she needs her thyroid checked if she stops taking that medicine prior to restarting. She mostly just wants to make sure nothing is wrong.   - ROS: She's got no fever, stiffness, trouble breathing or swallowing, coughing, pain with swallowing, runny nose, congestion. No sick contacts  Objective: BP 99/71 mmHg  Pulse 83  Temp(Src) 98.5 F (36.9 C) (Oral)  Wt 229 lb (103.874 kg)  LMP 11/25/2015 (Approximate) Gen: Somewhat anxious-appearing 35 y.o. female in no distress HEENT: MMM, conjunctivae normal, TMs pearly grey without inflammation or effusion, no tonsillar swelling, no exudates; no anterior cervical lymphadenopathy Neck: supple, normal JVP, no enlargement, nodularity, or tenderness of thyroid. No carotid bruits or tenderness to palpation. The patient point to the right carotid bulb when indicating pain location, though this is nontender to palpation.  CV: RRR, no murmur Resp: Non-labored breathing ambient air, CTAB, no wheezes noted Psych: No SI or HI. No obvious response to internal stimuli. Goal-directed, slightly perseverative on "I think I need my thyroid checked."  Lab Results  Component Value Date   TSH 1.976 08/21/2015   Assessment & Plan: Desiraye Mesker is a 35 y.o. female with right carotidynia.   Carotidynia Right throat pain over carotid bulb without bruit. Thyroid normal. HEENT wnl as well. Gave pt reassurance, and urged follow up if any symptoms change. She is to continue all medications as prescribed including synthroid.

## 2015-12-16 NOTE — Patient Instructions (Signed)
Keep taking synthroid and all other medications as prescribed. This pain should go away slowly on its own and is not dangerous.   You can try applying ice to the area and taking ibuprofen 400mg  every 6 hours if needed.

## 2015-12-16 NOTE — Assessment & Plan Note (Signed)
Right throat pain over carotid bulb without bruit. Thyroid normal. HEENT wnl as well. Gave pt reassurance, and urged follow up if any symptoms change. She is to continue all medications as prescribed including synthroid.

## 2015-12-18 NOTE — Progress Notes (Signed)
   THERAPIST PROGRESS NOTE  Session Time: 14min  Participation Level: Active  Behavioral Response: DisheveledAlertDepressed  Type of Therapy: Individual Therapy  Treatment Goals addressed: Coping and Diagnosis: Bipolar  Interventions: CBT, Motivational Interviewing, Solution Focused, Strength-based, Supportive and Reframing  Summary: Colleen Peck is a 35 y.o. female who presents with continued symptoms of her diagnosis.  Patient reports that she was terminated from her job and she currently does not have insurance.  Patient is fearful that she will not be able to work with children in the future.  Her current occupation is early childhood development.  Patient continues to struggle with her appetite and sleep habits.  Patient denies any paranoia.  Discussion of coping skills that she has attempted; denied attempting any.  Patient was given information about medicaid and disability.  Patient reports that she does not want to use Government assistance.   Suicidal/Homicidal: Nowithout intent/plan  Therapist Response: LCSW provided Patient with ongoing emotional support and encouragement.  Normalized her feelings.  Commended Patient on her progress and reinforced the importance of client staying focused on her own strengths and resources and resiliency. Processed various strategies for dealing with stressors.    Plan: Return again in 2 weeks.  Diagnosis: Axis I: Bipolar, mixed    Axis II: Cluster B Traits    Lubertha South, LCSW 12/16/2015

## 2015-12-30 ENCOUNTER — Encounter (HOSPITAL_COMMUNITY): Payer: Self-pay | Admitting: Emergency Medicine

## 2015-12-30 ENCOUNTER — Emergency Department (HOSPITAL_COMMUNITY)
Admission: EM | Admit: 2015-12-30 | Discharge: 2015-12-31 | Disposition: A | Discharge status: Other Acute Inpatient Hospital | Payer: Medicaid Other | Attending: Emergency Medicine | Admitting: Emergency Medicine

## 2015-12-30 DIAGNOSIS — R0789 Other chest pain: Secondary | ICD-10-CM | POA: Insufficient documentation

## 2015-12-30 DIAGNOSIS — F329 Major depressive disorder, single episode, unspecified: Secondary | ICD-10-CM | POA: Insufficient documentation

## 2015-12-30 DIAGNOSIS — J45909 Unspecified asthma, uncomplicated: Secondary | ICD-10-CM | POA: Diagnosis not present

## 2015-12-30 DIAGNOSIS — F32A Depression, unspecified: Secondary | ICD-10-CM

## 2015-12-30 LAB — URINALYSIS, ROUTINE W REFLEX MICROSCOPIC
Bilirubin Urine: NEGATIVE
GLUCOSE, UA: NEGATIVE mg/dL
Hgb urine dipstick: NEGATIVE
KETONES UR: 15 mg/dL — AB
Nitrite: NEGATIVE
PROTEIN: NEGATIVE mg/dL
Specific Gravity, Urine: 1.026 (ref 1.005–1.030)
pH: 6 (ref 5.0–8.0)

## 2015-12-30 LAB — URINE MICROSCOPIC-ADD ON

## 2015-12-30 LAB — COMPREHENSIVE METABOLIC PANEL
ALK PHOS: 59 U/L (ref 38–126)
ALT: 21 U/L (ref 14–54)
AST: 19 U/L (ref 15–41)
Albumin: 4 g/dL (ref 3.5–5.0)
Anion gap: 9 (ref 5–15)
BILIRUBIN TOTAL: 0.6 mg/dL (ref 0.3–1.2)
BUN: 7 mg/dL (ref 6–20)
CALCIUM: 9.9 mg/dL (ref 8.9–10.3)
CHLORIDE: 104 mmol/L (ref 101–111)
CO2: 23 mmol/L (ref 22–32)
CREATININE: 0.86 mg/dL (ref 0.44–1.00)
Glucose, Bld: 98 mg/dL (ref 65–99)
Potassium: 3.5 mmol/L (ref 3.5–5.1)
Sodium: 136 mmol/L (ref 135–145)
TOTAL PROTEIN: 7.5 g/dL (ref 6.5–8.1)

## 2015-12-30 LAB — CBC
HCT: 37.2 % (ref 36.0–46.0)
Hemoglobin: 11.9 g/dL — ABNORMAL LOW (ref 12.0–15.0)
MCH: 26.2 pg (ref 26.0–34.0)
MCHC: 32 g/dL (ref 30.0–36.0)
MCV: 81.8 fL (ref 78.0–100.0)
PLATELETS: 340 10*3/uL (ref 150–400)
RBC: 4.55 MIL/uL (ref 3.87–5.11)
RDW: 12.9 % (ref 11.5–15.5)
WBC: 6.1 10*3/uL (ref 4.0–10.5)

## 2015-12-30 LAB — LIPASE, BLOOD: LIPASE: 19 U/L (ref 11–51)

## 2015-12-30 LAB — I-STAT BETA HCG BLOOD, ED (MC, WL, AP ONLY)

## 2015-12-30 MED ORDER — ACETAMINOPHEN 500 MG PO TABS
1000.0000 mg | ORAL_TABLET | Freq: Once | ORAL | Status: AC
  Administered 2015-12-30: 1000 mg via ORAL
  Filled 2015-12-30: qty 2

## 2015-12-30 MED ORDER — LORAZEPAM 1 MG PO TABS
1.0000 mg | ORAL_TABLET | Freq: Once | ORAL | Status: AC
  Administered 2015-12-30: 1 mg via ORAL
  Filled 2015-12-30: qty 1

## 2015-12-30 MED ORDER — IBUPROFEN 800 MG PO TABS
800.0000 mg | ORAL_TABLET | Freq: Once | ORAL | Status: AC
  Administered 2015-12-30: 800 mg via ORAL
  Filled 2015-12-30: qty 1

## 2015-12-30 NOTE — ED Notes (Signed)
Pt. reports right lateral/upper abdominal pain onset today with constipation ( last BM 3 days ago) , denies nausea or vomitting , no fever or chills , pt. added anxiety attack today .

## 2015-12-30 NOTE — ED Provider Notes (Signed)
CSN: [id number]     Arrival date & time 12/30/15  2014 History   First MD Initiated Contact with Patient 12/30/15 2308     Chief Complaint  Patient presents with  . Abdominal Pain  . Anxiety     (Consider location/radiation/quality/duration/timing/severity/associated sxs/prior Treatment) Patient is a 35 y.o. female presenting with abdominal pain, anxiety, and chest pain. The history is provided by the patient.  Abdominal Pain Associated symptoms: no chest pain, no chills, no dysuria, no fever, no nausea, no shortness of breath and no vomiting   Anxiety Associated symptoms include abdominal pain. Pertinent negatives include no chest pain, no headaches and no shortness of breath.  Chest Pain Pain location:  R lateral chest Pain quality: sharp   Pain radiates to:  Does not radiate Pain radiates to the back: no   Pain severity:  Moderate Onset quality:  Gradual Duration:  2 months Timing:  Intermittent Progression:  Worsening Chronicity:  New Relieved by:  Nothing Worsened by:  Nothing tried Ineffective treatments:  None tried Associated symptoms: abdominal pain and anxiety   Associated symptoms: no dizziness, no fever, no headache, no nausea, no palpitations, no shortness of breath and not vomiting    35 yo F With a chief complaint of right-sided chest wall pain. This been off and on for quite some time. States is worse with constipation anxiety. Denies any other worsening factors. Denies fevers chills denies vomiting or diarrhea. Hasn't had a bowel movement in 3 days but does not feel like she is constipated.  The sister is more concerned about her mental health. Patient has a history of schizoaffective disorder and borderline personality disorder. They state that she has been crying uncontrollably in the room for hours at a time. She is unable to get out of the house and get anything accomplished. Patient states that she feels very sad. She is taking her current psychiatric  medications but they're not helping her. Her psychiatrist is trying get her to a mood clinic. However they think that they do not have insurance to get in.  Past Medical History  Diagnosis Date  . Asthma   . Seasonal allergies   . Depression   . PTSD (post-traumatic stress disorder)   . Thyroid disease 2009    Graves disease (pt reported resolved); hypothyriodism  . Abnormal pap     pt reports abnl pap many years ago.  Nl since then.  . Palpitations 03/12/2008  . Bipolar disorder [facility])    Past Surgical History  Procedure Laterality Date  . Dilation and curettage of uterus  March 2006   Family History  Problem Relation Age of Onset  . Drug abuse Father   . Depression Maternal Aunt   . Depression Maternal Grandmother   . Anxiety disorder Maternal Grandmother   . COPD Maternal Grandmother   . Suicidality Cousin   . Depression Cousin   . Bipolar disorder Cousin   . Depression Maternal Aunt   . Hypertension Mother   . Depression Mother   . Diabetes Paternal Grandfather   . COPD Paternal Grandmother   . Heart disease Neg Hx    Social History  Substance Use Topics  . Smoking status: Never Smoker   . Smokeless tobacco: Never Used  . Alcohol Use: No   OB History    No data available     Review of Systems  Constitutional: Negative for fever and chills.  HENT: Negative for congestion and rhinorrhea.   Eyes: Negative for redness and  visual disturbance.  Respiratory: Negative for shortness of breath and wheezing.   Cardiovascular: Negative for chest pain and palpitations.  Gastrointestinal: Positive for abdominal pain. Negative for nausea and vomiting.  Genitourinary: Negative for dysuria and urgency.  Musculoskeletal: Negative for myalgias and arthralgias.  Skin: Negative for pallor and wound.  Neurological: Negative for dizziness and headaches.  Psychiatric/Behavioral: Positive for behavioral problems, dysphoric mood and decreased concentration.      Allergies   Abilify  Home Medications   Prior to Admission medications   Medication Sig Start Date End Date Taking? Authorizing Provider  benztropine (COGENTIN) 0.5 MG tablet Take 1 tablet (0.5 mg total) by mouth 2 (two) times daily. 11/12/15 11/11/16 Yes Rainey Pines, MD  diclofenac (VOLTAREN) 75 MG EC tablet Take 1 tablet (75 mg total) by mouth 2 (two) times daily as needed for mild pain or moderate pain. 10/06/15  Yes Kerrie Buffalo, NP  ferrous sulfate 325 (65 FE) MG tablet Take 1 tablet (325 mg total) by mouth daily with breakfast. 10/06/15  Yes Kerrie Buffalo, NP  FLUoxetine (PROZAC) 20 MG tablet Take 1 tablet (20 mg total) by mouth daily. 11/19/15  Yes Rainey Pines, MD  hydrOXYzine (ATARAX/VISTARIL) 25 MG tablet Take 25 mg by mouth every 6 (six) hours as needed for anxiety or itching.  10/25/15  Yes Historical Provider, MD  levothyroxine (SYNTHROID, LEVOTHROID) 112 MCG tablet Take 2 tablets (224 mcg total) by mouth daily before breakfast. 10/06/15  Yes Kerrie Buffalo, NP  OXcarbazepine (TRILEPTAL) 150 MG tablet Take 2 tablets (300 mg total) by mouth 2 (two) times daily. 12/02/15  Yes Rainey Pines, MD  risperiDONE (RISPERDAL) 0.5 MG tablet Take 0.5 mg by mouth 2 (two) times daily.  11/17/15  Yes Historical Provider, MD  traZODone (DESYREL) 100 MG tablet Take 2 tablets (200 mg total) by mouth at bedtime. Patient taking differently: Take 100 mg by mouth at bedtime.  11/12/15  Yes Rainey Pines, MD  levothyroxine (SYNTHROID, LEVOTHROID) 112 MCG tablet TAKE TWO TABLETS BY MOUTH AT BEDTIME Patient not taking: Reported on 12/31/2015 12/15/15   Burna Cash Rumley, DO  polyethylene glycol powder (GLYCOLAX/MIRALAX) powder Take 17 g by mouth 2 (two) times daily as needed. Patient not taking: Reported on 12/31/2015 10/10/15   Ashly M Gottschalk, DO   BP 105/53 mmHg  Pulse 79  Temp(Src) 98.4 F (36.9 C) (Oral)  Resp 16  Ht 5\' 6"  (1.676 m)  Wt 226 lb (102.513 kg)  BMI 36.49 kg/m2  SpO2 100%  LMP 12/23/2015 Physical Exam   Constitutional: She is oriented to person, place, and time. She appears well-developed and well-nourished. No distress.  HENT:  Head: Normocephalic and atraumatic.  Eyes: EOM are normal. Pupils are equal, round, and reactive to light.  Neck: Normal range of motion. Neck supple.  Cardiovascular: Normal rate and regular rhythm.  Exam reveals no gallop and no friction rub.   No murmur heard. Pulmonary/Chest: Effort normal. She has no wheezes. She has no rales.  Abdominal: Soft. She exhibits no distension. There is no tenderness.  Musculoskeletal: She exhibits no edema or tenderness.  Neurological: She is alert and oriented to person, place, and time.  Skin: Skin is warm and dry. She is not diaphoretic.  Psychiatric: She has a normal mood and affect. Her behavior is normal.  Nursing note and vitals reviewed.   ED Course  Procedures (including critical care time) Labs Review Labs Reviewed  CBC - Abnormal; Notable for the following:    Hemoglobin 11.9 (*)  All other components within normal limits  URINALYSIS, ROUTINE W REFLEX MICROSCOPIC (NOT AT [facility]) - Abnormal; Notable for the following:    Ketones, ur 15 (*)    Leukocytes, UA SMALL (*)    All other components within normal limits  URINE MICROSCOPIC-ADD ON - Abnormal; Notable for the following:    Squamous Epithelial / LPF 6-30 (*)    Bacteria, UA FEW (*)    All other components within normal limits  LIPASE, BLOOD  COMPREHENSIVE METABOLIC PANEL  I-STAT BETA HCG BLOOD, ED ([facility], [facility], [facility] ONLY)    Imaging Review No results found. I have personally reviewed and evaluated these images and lab results as part of my medical decision-making.   EKG Interpretation None      MDM   Final diagnoses:  Depression  Right-sided chest wall pain    35 yo F with a chief complaint of right-sided chest wall pain. I feel this pain is musculoskeletal based on history and physical. She has no noted abdominal tenderness on exam. Negative  Murphy sign. Clear lung sounds. Medically cleared.  Will have TTS evaluate.  TTS will admit.   The patients results and plan were reviewed and discussed.   Any x-rays performed were independently reviewed by myself.   Differential diagnosis were considered with the presenting HPI.  Medications  LORazepam (ATIVAN) tablet 1 mg (1 mg Oral Given 12/30/15 2359)  acetaminophen (TYLENOL) tablet 1,000 mg (1,000 mg Oral Given 12/30/15 2359)  ibuprofen (ADVIL,MOTRIN) tablet 800 mg (800 mg Oral Given 12/30/15 2359)  LORazepam (ATIVAN) tablet 1 mg (1 mg Oral Given 12/31/15 1201)    Filed Vitals:   12/31/15 0030 12/31/15 0623 12/31/15 1153 12/31/15 1501  BP: 100/63 119/66 112/74 105/53  Pulse: 64 64 77 79  Temp:  97.8 F (36.6 C) 98.5 F (36.9 C) 98.4 F (36.9 C)  TempSrc:  Oral Oral   Resp:  16 20 16   Height:      Weight:      SpO2: 100% 100% 96% 100%    Final diagnoses:  Depression  Right-sided chest wall pain      Deno Etienne, DO 01/01/16 1500

## 2015-12-30 NOTE — ED Notes (Signed)
Dr. Floyd at bedside at this time.  

## 2015-12-31 ENCOUNTER — Inpatient Hospital Stay (HOSPITAL_COMMUNITY)
Admission: AD | Admit: 2015-12-31 | Discharge: 2016-01-13 | DRG: 885 | Disposition: A | Discharge status: Home / Self Care | Payer: Medicaid Other | Source: Intra-hospital | Attending: Psychiatry | Admitting: Psychiatry

## 2015-12-31 ENCOUNTER — Encounter (HOSPITAL_COMMUNITY): Payer: Self-pay

## 2015-12-31 DIAGNOSIS — J45909 Unspecified asthma, uncomplicated: Secondary | ICD-10-CM | POA: Diagnosis present

## 2015-12-31 DIAGNOSIS — F4312 Post-traumatic stress disorder, chronic: Secondary | ICD-10-CM | POA: Diagnosis present

## 2015-12-31 DIAGNOSIS — F4310 Post-traumatic stress disorder, unspecified: Secondary | ICD-10-CM | POA: Diagnosis present

## 2015-12-31 DIAGNOSIS — F250 Schizoaffective disorder, bipolar type: Principal | ICD-10-CM | POA: Diagnosis present

## 2015-12-31 DIAGNOSIS — Z833 Family history of diabetes mellitus: Secondary | ICD-10-CM

## 2015-12-31 DIAGNOSIS — N390 Urinary tract infection, site not specified: Secondary | ICD-10-CM | POA: Diagnosis present

## 2015-12-31 DIAGNOSIS — F603 Borderline personality disorder: Secondary | ICD-10-CM | POA: Diagnosis present

## 2015-12-31 DIAGNOSIS — Z79899 Other long term (current) drug therapy: Secondary | ICD-10-CM

## 2015-12-31 DIAGNOSIS — F259 Schizoaffective disorder, unspecified: Secondary | ICD-10-CM | POA: Diagnosis present

## 2015-12-31 DIAGNOSIS — Z8659 Personal history of other mental and behavioral disorders: Secondary | ICD-10-CM

## 2015-12-31 DIAGNOSIS — E039 Hypothyroidism, unspecified: Secondary | ICD-10-CM | POA: Diagnosis present

## 2015-12-31 DIAGNOSIS — Z62810 Personal history of physical and sexual abuse in childhood: Secondary | ICD-10-CM | POA: Diagnosis present

## 2015-12-31 DIAGNOSIS — K5909 Other constipation: Secondary | ICD-10-CM

## 2015-12-31 DIAGNOSIS — R45851 Suicidal ideations: Secondary | ICD-10-CM | POA: Diagnosis present

## 2015-12-31 DIAGNOSIS — Z825 Family history of asthma and other chronic lower respiratory diseases: Secondary | ICD-10-CM

## 2015-12-31 DIAGNOSIS — F332 Major depressive disorder, recurrent severe without psychotic features: Secondary | ICD-10-CM | POA: Diagnosis present

## 2015-12-31 DIAGNOSIS — Z8249 Family history of ischemic heart disease and other diseases of the circulatory system: Secondary | ICD-10-CM

## 2015-12-31 DIAGNOSIS — G4700 Insomnia, unspecified: Secondary | ICD-10-CM | POA: Diagnosis present

## 2015-12-31 DIAGNOSIS — Z818 Family history of other mental and behavioral disorders: Secondary | ICD-10-CM

## 2015-12-31 MED ORDER — RISPERIDONE 0.5 MG PO TABS
0.5000 mg | ORAL_TABLET | Freq: Two times a day (BID) | ORAL | Status: DC
  Administered 2015-12-31 – 2016-01-01 (×2): 0.5 mg via ORAL
  Filled 2015-12-31 (×4): qty 1

## 2015-12-31 MED ORDER — MAGNESIUM HYDROXIDE 400 MG/5ML PO SUSP
30.0000 mL | Freq: Every day | ORAL | Status: DC | PRN
  Administered 2016-01-02 – 2016-01-04 (×2): 30 mL via ORAL
  Filled 2015-12-31 (×2): qty 30

## 2015-12-31 MED ORDER — LORAZEPAM 1 MG PO TABS
1.0000 mg | ORAL_TABLET | Freq: Once | ORAL | Status: AC
  Administered 2015-12-31: 1 mg via ORAL
  Filled 2015-12-31: qty 1

## 2015-12-31 MED ORDER — HYDROXYZINE HCL 25 MG PO TABS
25.0000 mg | ORAL_TABLET | Freq: Four times a day (QID) | ORAL | Status: DC | PRN
  Administered 2016-01-01 – 2016-01-11 (×5): 25 mg via ORAL
  Filled 2015-12-31 (×4): qty 1
  Filled 2015-12-31: qty 10

## 2015-12-31 MED ORDER — ACETAMINOPHEN 325 MG PO TABS: 650.0000 mg | ORAL_TABLET | Freq: Four times a day (QID) | ORAL | Status: DC | PRN

## 2015-12-31 MED ORDER — OLANZAPINE 5 MG PO TBDP
5.0000 mg | ORAL_TABLET | Freq: Three times a day (TID) | ORAL | Status: DC | PRN
  Administered 2016-01-05: 5 mg via ORAL
  Filled 2015-12-31: qty 1

## 2015-12-31 MED ORDER — LEVOTHYROXINE SODIUM 112 MCG PO TABS
112.0000 ug | ORAL_TABLET | Freq: Every day | ORAL | Status: DC
  Administered 2016-01-01 – 2016-01-09 (×9): 112 ug via ORAL
  Filled 2015-12-31 (×12): qty 1

## 2015-12-31 MED ORDER — ALUM & MAG HYDROXIDE-SIMETH 200-200-20 MG/5ML PO SUSP: 30.0000 mL | ORAL | Status: DC | PRN

## 2015-12-31 MED ORDER — OXCARBAZEPINE 300 MG PO TABS
300.0000 mg | ORAL_TABLET | Freq: Two times a day (BID) | ORAL | Status: DC
  Administered 2015-12-31 – 2016-01-01 (×2): 300 mg via ORAL
  Filled 2015-12-31: qty 2
  Filled 2015-12-31 (×2): qty 1
  Filled 2015-12-31: qty 2

## 2015-12-31 MED ORDER — TRAZODONE HCL 100 MG PO TABS
100.0000 mg | ORAL_TABLET | Freq: Every day | ORAL | Status: DC
  Filled 2015-12-31 (×2): qty 1

## 2015-12-31 MED ORDER — BENZTROPINE MESYLATE 0.5 MG PO TABS
0.5000 mg | ORAL_TABLET | Freq: Two times a day (BID) | ORAL | Status: DC
  Administered 2015-12-31 – 2016-01-01 (×2): 0.5 mg via ORAL
  Filled 2015-12-31 (×4): qty 1

## 2015-12-31 MED ORDER — FLUOXETINE HCL 20 MG PO TABS
20.0000 mg | ORAL_TABLET | Freq: Every day | ORAL | Status: DC
  Administered 2015-12-31: 20 mg via ORAL
  Filled 2015-12-31 (×3): qty 1

## 2015-12-31 NOTE — Tx Team (Signed)
Initial Interdisciplinary Treatment Plan   PATIENT STRESSORS: Financial difficulties   PATIENT STRENGTHS: Ability for insight Communication skills Physical Health   PROBLEM LIST: Problem List/Patient Goals Date to be addressed Date deferred Reason deferred Estimated date of resolution  "I have been having crying spells on a daily basis"      Increased anxiety and depression                                                  DISCHARGE CRITERIA:  Improved stabilization in mood, thinking, and/or behavior Motivation to continue treatment in a less acute level of care  PRELIMINARY DISCHARGE PLAN: Outpatient therapy  PATIENT/FAMIILY INVOLVEMENT: This treatment plan has been presented to and reviewed with the patient, Colleen Peck, and/or family member The patient and family have been given the opportunity to ask questions and make suggestions.  [name] 12/31/2015, 5:32 PM

## 2015-12-31 NOTE — Progress Notes (Signed)
Patient ID: Colleen Peck, female   DOB: 07-15-1980, 35 y.o.   MRN: [id number] Patient admitted with a complaint of increased anxiety and depression.  Upon admission patient states that she feels as though people are after her and trying to kill her.  Patient states that she has had daily crying spells and she is fearful of leaving her home.  Patient denies thoughts of self harm at this time, but feels as though her medications have not been effective for her.

## 2015-12-31 NOTE — BH Assessment (Signed)
Assessment completed. Consulted Patriciaann Clan, PA-C who agrees that pt meets inpatient criteria. TTS to seek placement. Informed Antonietta Breach, PA-C of the recommendation.

## 2015-12-31 NOTE — ED Notes (Signed)
Patient feels like she could safely go home and follow up outpatient but is concerned that since she has recently no longer covered by insurance that she will not be able to find a provider.

## 2015-12-31 NOTE — ED Notes (Signed)
TTS in process at this time  

## 2015-12-31 NOTE — ED Provider Notes (Signed)
1:19 AM Patient meets inpatient criteria. TTS seeking placement.  Antonietta Breach, PA-C 12/31/15 [place], DO 01/01/16 1501

## 2015-12-31 NOTE — ED Notes (Signed)
Breakfast order called in at 0644. 

## 2015-12-31 NOTE — ED Notes (Signed)
Patient was given a snack and drink. A regular diet ordered for lunch.
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
## 2016-01-01 ENCOUNTER — Encounter (HOSPITAL_COMMUNITY): Payer: Self-pay | Admitting: Psychiatry

## 2016-01-01 ENCOUNTER — Other Ambulatory Visit: Payer: Self-pay

## 2016-01-01 DIAGNOSIS — F603 Borderline personality disorder: Secondary | ICD-10-CM | POA: Diagnosis present

## 2016-01-01 DIAGNOSIS — Z79899 Other long term (current) drug therapy: Secondary | ICD-10-CM | POA: Diagnosis not present

## 2016-01-01 DIAGNOSIS — Z818 Family history of other mental and behavioral disorders: Secondary | ICD-10-CM | POA: Diagnosis not present

## 2016-01-01 DIAGNOSIS — Z833 Family history of diabetes mellitus: Secondary | ICD-10-CM | POA: Diagnosis not present

## 2016-01-01 DIAGNOSIS — F259 Schizoaffective disorder, unspecified: Secondary | ICD-10-CM | POA: Diagnosis present

## 2016-01-01 DIAGNOSIS — F250 Schizoaffective disorder, bipolar type: Secondary | ICD-10-CM | POA: Diagnosis present

## 2016-01-01 DIAGNOSIS — G4700 Insomnia, unspecified: Secondary | ICD-10-CM | POA: Diagnosis present

## 2016-01-01 DIAGNOSIS — E039 Hypothyroidism, unspecified: Secondary | ICD-10-CM | POA: Diagnosis present

## 2016-01-01 DIAGNOSIS — Z62810 Personal history of physical and sexual abuse in childhood: Secondary | ICD-10-CM | POA: Diagnosis present

## 2016-01-01 DIAGNOSIS — N390 Urinary tract infection, site not specified: Secondary | ICD-10-CM | POA: Diagnosis present

## 2016-01-01 DIAGNOSIS — J45909 Unspecified asthma, uncomplicated: Secondary | ICD-10-CM | POA: Diagnosis present

## 2016-01-01 DIAGNOSIS — F4310 Post-traumatic stress disorder, unspecified: Secondary | ICD-10-CM | POA: Diagnosis present

## 2016-01-01 DIAGNOSIS — Z8659 Personal history of other mental and behavioral disorders: Secondary | ICD-10-CM

## 2016-01-01 DIAGNOSIS — Z8249 Family history of ischemic heart disease and other diseases of the circulatory system: Secondary | ICD-10-CM | POA: Diagnosis not present

## 2016-01-01 DIAGNOSIS — R45851 Suicidal ideations: Secondary | ICD-10-CM | POA: Diagnosis present

## 2016-01-01 DIAGNOSIS — Z825 Family history of asthma and other chronic lower respiratory diseases: Secondary | ICD-10-CM | POA: Diagnosis not present

## 2016-01-01 MED ORDER — OXCARBAZEPINE 150 MG PO TABS
150.0000 mg | ORAL_TABLET | Freq: Two times a day (BID) | ORAL | Status: DC
  Administered 2016-01-01 – 2016-01-07 (×12): 150 mg via ORAL
  Filled 2016-01-01 (×16): qty 1

## 2016-01-01 MED ORDER — TRAZODONE HCL 100 MG PO TABS: 100.0000 mg | ORAL_TABLET | Freq: Every evening | ORAL | Status: DC | PRN

## 2016-01-01 MED ORDER — QUETIAPINE FUMARATE 25 MG PO TABS
25.0000 mg | ORAL_TABLET | Freq: Every day | ORAL | Status: DC
  Administered 2016-01-01 – 2016-01-04 (×4): 25 mg via ORAL
  Filled 2016-01-01 (×6): qty 1

## 2016-01-01 MED ORDER — ACETAMINOPHEN 325 MG PO TABS
650.0000 mg | ORAL_TABLET | Freq: Four times a day (QID) | ORAL | Status: DC | PRN
  Administered 2016-01-01 – 2016-01-07 (×4): 650 mg via ORAL
  Filled 2016-01-01 (×4): qty 2

## 2016-01-01 MED ORDER — LAMOTRIGINE 25 MG PO TABS
25.0000 mg | ORAL_TABLET | Freq: Every day | ORAL | Status: DC
  Administered 2016-01-01 – 2016-01-04 (×4): 25 mg via ORAL
  Filled 2016-01-01 (×8): qty 1

## 2016-01-01 MED ORDER — DOCUSATE SODIUM 100 MG PO CAPS
100.0000 mg | ORAL_CAPSULE | Freq: Two times a day (BID) | ORAL | Status: DC
  Administered 2016-01-01 – 2016-01-13 (×18): 100 mg via ORAL
  Filled 2016-01-01 (×17): qty 1
  Filled 2016-01-01: qty 14
  Filled 2016-01-01 (×3): qty 1
  Filled 2016-01-01: qty 14
  Filled 2016-01-01 (×8): qty 1

## 2016-01-01 MED ORDER — LORAZEPAM 2 MG/ML IJ SOLN: 1.0000 mg | Freq: Four times a day (QID) | INTRAMUSCULAR | Status: DC | PRN

## 2016-01-01 MED ORDER — FLUOXETINE HCL 20 MG PO CAPS
20.0000 mg | ORAL_CAPSULE | Freq: Every day | ORAL | Status: DC
  Administered 2016-01-01 – 2016-01-13 (×13): 20 mg via ORAL
  Filled 2016-01-01 (×6): qty 1
  Filled 2016-01-01: qty 7
  Filled 2016-01-01 (×8): qty 1

## 2016-01-01 MED ORDER — LORAZEPAM 1 MG PO TABS
1.0000 mg | ORAL_TABLET | Freq: Four times a day (QID) | ORAL | Status: DC | PRN
  Administered 2016-01-02 – 2016-01-11 (×10): 1 mg via ORAL
  Filled 2016-01-01 (×3): qty 1
  Filled 2016-01-01: qty 2
  Filled 2016-01-01 (×6): qty 1

## 2016-01-01 MED ORDER — LITHIUM CARBONATE 300 MG PO CAPS: 300.0000 mg | ORAL_CAPSULE | Freq: Two times a day (BID) | ORAL | Status: DC

## 2016-01-01 MED ORDER — QUETIAPINE FUMARATE 50 MG PO TABS
50.0000 mg | ORAL_TABLET | Freq: Three times a day (TID) | ORAL | Status: DC | PRN
  Administered 2016-01-03 – 2016-01-04 (×3): 50 mg via ORAL
  Filled 2016-01-01: qty 1
  Filled 2016-01-01: qty 10
  Filled 2016-01-01 (×2): qty 1

## 2016-01-01 NOTE — Progress Notes (Signed)
Patient ID: Colleen Peck, female   DOB: 1981/05/15, 35 y.o.   MRN: [id number] D-Am med pass she took meds but reluctantly because she doesn't believe they are benefitting her. States she is planning on going onto the unit, 500 hall when her time is up here in OBS. States she is concerned to go over there if there are any violent people on the unit. Reassured it was staffs responsibility to keep her and everyone on the unit safe, and not to be afraid. She is reporting anxiety and depression, but denies any thoughts to hurt self or others.  A-Support offered. Waiting on a bed on adult unit and staff to be ready to receive her for admission. Medications as ordered. Monitored for safety.  R-Sleeping at this time. No behavior issues. Pleasant quiet and helpless.

## 2016-01-01 NOTE — Progress Notes (Signed)
Patient ID: Colleen Peck, female   DOB: 1981-03-20, 35 y.o.   MRN: [id number] Seen this am by Eston Mould MD about 10 and waiting on order for discharge and move to 500 hall on adult unit, and then the nurse to be ready to accept her. She has been tearful periodically this shift, and when asked why and what can help she is unable to identify anything. Just says she wants to feel better. Asked now for something for anxiety. Gave her a Vistaril and if not helpful will follow up with Zyprexa which is available for agitation. She denies psychosis. Resting and crying. She is eating and taking liquids well.

## 2016-01-01 NOTE — Progress Notes (Signed)
Patient ID: Colleen Peck, female   DOB: 11-Aug-1980, 35 y.o.   MRN: [id number] Re inventoried her property and returned to her pink flip flops and $5.00. She contacted her mom to inform her of her admission to the adult unit and her code number. Tearful now saying she just wants to go home.

## 2016-01-01 NOTE — Progress Notes (Signed)
Colleen Peck in bed on unit. Pt presents with MDD, recurrent episode, moderate, without psychosic.  Pt   Pt watches conversation between staff. Pt presents with MDD, recurrent episode, moderate, without psychosis..  Pt has flat affect and only answers direct questions in a quiet voice.  Pt is stressing out d/t overwhelming decisions that she will need to make soon.  Pt denies SI, HI avh and is willing to verbally contract for safety.  Pt seems suspicious of the meds that she is being given and asks the reason behind meds that she does not recognize.  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  Pt sts she is compliant with her meds but does not believe that they are working. Pt continuously observed for safety while on the unit except when in the bathroom. Pt remains safe.

## 2016-01-01 NOTE — Progress Notes (Signed)
D:Patient in the bed on approach.  Patient states she had an ok day.  Patient states she has been tired.  Patient complains of depression and racing thoughts.  Patient did not engage in conversation with Probation officer.   Patient denies SI/HI and denies AVH. A: Staff to monitor Q 15 mins for safety.  Encouragement and support offered.  Scheduled medications administered per orders. R: Patient remains safe on the unit.  Patient did not attend group tonight.  Patient not visible on the unit.  Patient taking administered medications.
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
## 2016-01-01 NOTE — Tx Team (Signed)
Initial Interdisciplinary Treatment Plan   PATIENT STRESSORS: Educational concerns Occupational concerns   PATIENT STRENGTHS: Ability for insight Average or above average intelligence Capable of independent living General fund of knowledge Motivation for treatment/growth Physical Health Supportive family/friends Work skills   PROBLEM LIST: Problem List/Patient Goals Date to be addressed Date deferred Reason deferred Estimated date of resolution  Decrease sleep 01/01/16     Stress with school and work 01/01/16     paranoia 01/01/16                                          DISCHARGE CRITERIA:  Ability to meet basic life and health needs Adequate post-discharge living arrangements Improved stabilization in mood, thinking, and/or behavior Medical problems require only outpatient monitoring Motivation to continue treatment in a less acute level of care Need for constant or close observation no longer present Reduction of life-threatening or endangering symptoms to within safe limits Safe-care adequate arrangements made Verbal commitment to aftercare and medication compliance  PRELIMINARY DISCHARGE PLAN: Attend aftercare/continuing care group Outpatient therapy Return to previous living arrangement Return to previous work or school arrangements  PATIENT/FAMIILY INVOLVEMENT: This treatment plan has been presented to and reviewed with the patient, Colleen Peck, and/or family member, .  The patient and family have been given the opportunity to ask questions and make suggestions.  Mosie Lukes 01/01/2016, 3:31 PM

## 2016-01-01 NOTE — Progress Notes (Signed)
Patient ID: Colleen Peck, female   DOB: 1981/02/17, 35 y.o.   MRN: [id number] Seen by Eston Mould. MD and he is admitting her to the adult unit, room 504. She is stressed and overwhelmed by school and multiple stressors. Feeling paranoid like someone wants to hurt her. Periodically tearful throughout shift. States she just wants her life back. Transferring her to the adult unit now.

## 2016-01-01 NOTE — Progress Notes (Signed)
Pt did not attend karaoke group.  

## 2016-01-01 NOTE — Progress Notes (Addendum)
D: Pt ambulatory to unit with a steady gait. Present with flat affect and depressed mood on initial approach. A & O X4. Denies SI, HI and AVH when assessed. Requested Tylenol for c/o right side pain 7/10.  A: Scheduled and PRN medications administered as ordered. Emotional support and availability provided to pt. Urine sample collected as ordered and EKG done as well. Safety checks maintained at 15 minutes intervals without incident to note at this time. Writer encouraged pt to voice needs and concerns.  R: Pt compliant with medications when offered. Denies adverse drug reactions. Cooperative with unit routines and care at present. Remains safe on and off unit. Continue POC.
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
## 2016-01-02 ENCOUNTER — Encounter (HOSPITAL_COMMUNITY): Payer: Self-pay | Admitting: Psychiatry

## 2016-01-02 DIAGNOSIS — Z8659 Personal history of other mental and behavioral disorders: Secondary | ICD-10-CM

## 2016-01-02 DIAGNOSIS — F4310 Post-traumatic stress disorder, unspecified: Secondary | ICD-10-CM

## 2016-01-02 LAB — RAPID URINE DRUG SCREEN, HOSP PERFORMED
AMPHETAMINES: NOT DETECTED
BENZODIAZEPINES: POSITIVE — AB
Barbiturates: NOT DETECTED
Cocaine: NOT DETECTED
OPIATES: NOT DETECTED
Tetrahydrocannabinol: NOT DETECTED

## 2016-01-02 LAB — URINALYSIS W MICROSCOPIC (NOT AT ARMC)
BILIRUBIN URINE: NEGATIVE
Glucose, UA: NEGATIVE mg/dL
HGB URINE DIPSTICK: NEGATIVE
Ketones, ur: NEGATIVE mg/dL
Nitrite: NEGATIVE
PROTEIN: NEGATIVE mg/dL
Specific Gravity, Urine: 1.021 (ref 1.005–1.030)
pH: 5.5 (ref 5.0–8.0)

## 2016-01-02 NOTE — BHH Suicide Risk Assessment (Signed)
[facility] Admission Suicide Risk Assessment   Nursing information obtained from:  Patient Demographic factors:  Unemployed Current Mental Status:  NA Loss Factors:  Decrease in vocational status, Financial problems / change in socioeconomic status Historical Factors:  NA Risk Reduction Factors:  Religious beliefs about death, Living with another person, especially a relative, Positive social support, Positive coping skills or problem solving skills  Total Time spent with patient: 30 minutes Principal Problem: Schizoaffective disorder, bipolar type ([place]) Diagnosis:   Patient Active Problem List   Diagnosis Date Noted  . History of borderline personality disorder [Z86.59] 01/01/2016  . Carotidynia [G90.01] 12/16/2015  . Schizoaffective disorder, bipolar type ([place]) [F25.0] 11/26/2015  . Anxiety [F41.9]   . Pain of left calf [M79.662] 11/10/2015  . PTSD (post-traumatic stress disorder) [F43.10] 09/30/2015  . Medical clearance for psychiatric admission [Z00.8]   . Paranoia ([place]) [F22]   . Anemia [D64.9] 01/30/2015  . Unspecified vitamin D deficiency [E55.9] 05/22/2013  . Hypothyroidism (acquired) [E03.9] 11/29/2011  . Obesity [E66.9] 09/08/2006  . RHINITIS, ALLERGIC [J30.9] 09/08/2006  . Asthma [J45.909] 09/08/2006  . ECZEMA, ATOPIC DERMATITIS [L20.89] 09/08/2006   Subjective Data: Please see H&P.   Continued Clinical Symptoms:  Alcohol Use Disorder Identification Test Final Score (AUDIT): 0 The "Alcohol Use Disorders Identification Test", Guidelines for Use in Primary Care, Second Edition.  World Pharmacologist [facility]). Score between 0-7:  no or low risk or alcohol related problems. Score between 8-15:  moderate risk of alcohol related problems. Score between 16-19:  high risk of alcohol related problems. Score 20 or above:  warrants further diagnostic evaluation for alcohol dependence and treatment.   CLINICAL FACTORS:   Previous Psychiatric Diagnoses and  Treatments   Musculoskeletal: Strength & Muscle Tone: within normal limits Gait & Station: normal Patient leans: N/A  Psychiatric Specialty Exam: Physical Exam  Nursing note and vitals reviewed. Constitutional:  I concur with PE done in ED.    Review of Systems  Psychiatric/Behavioral: Positive for depression. The patient is nervous/anxious.   All other systems reviewed and are negative.   Blood pressure 106/64, pulse 92, temperature 98.4 F (36.9 C), temperature source Oral, resp. rate 16, height 5' 6.5" (1.689 m), weight 102.513 kg (226 lb), last menstrual period 12/23/2015, SpO2 98 %.Body mass index is 35.94 kg/(m^2).        Please see H&P.                                                   COGNITIVE FEATURES THAT CONTRIBUTE TO RISK:  Closed-mindedness, Polarized thinking and Thought constriction (tunnel vision)    SUICIDE RISK:   Moderate:  Frequent suicidal ideation with limited intensity, and duration, some specificity in terms of plans, no associated intent, good self-control, limited dysphoria/symptomatology, some risk factors present, and identifiable protective factors, including available and accessible social support.  PLAN OF CARE: Please see H&P.   I certify that inpatient services furnished can reasonably be expected to improve the patient's condition.   Cheskel Silverio, MD 01/02/2016, 8:30 AM

## 2016-01-02 NOTE — BHH Group Notes (Signed)
[facility] LCSW Group Therapy  01/02/2016  1:05 PM  Type of Therapy:  Group therapy  Participation Level:  Active  Participation Quality:  Attentive  Affect:  Flat  Cognitive:  Oriented  Insight:  Limited  Engagement in Therapy:  Limited  Modes of Intervention:  Discussion, Socialization  Summary of Progress/Problems:  Chaplain was here to lead a group on themes of hope and courage. Stayed the entire time.  Flat affect.  Willing to engage only when asked directly. Talked about the support she gets and appreciates from her family. Roque Lias B 01/02/2016 10:35 AM

## 2016-01-02 NOTE — Progress Notes (Signed)
Patient ID: Colleen Peck, female   DOB: 1980/10/13, 35 y.o.   MRN: [id number]  D: Patient has a flat affect on approach. Doesn't brighten when speaking to her. Appears guarded. Rates depression "3", hopelessness "0", and anxiety "5". Denies any physical problems. Reports appetite and sleep good. Contracts for safety. A: Staff will monitor on q 15 minute checks, follow treatment plan, and give meds as ordered. R: Cooperative with unit rules and taking medications as ordered except docusate sodium. She wanted Milk of Magnesia instead.

## 2016-01-02 NOTE — Tx Team (Signed)
Interdisciplinary Treatment Plan Update (Adult)  Date:  01/02/2016   Time Reviewed:  8:26 AM   Progress in Treatment: Attending groups: Yes. Participating in groups:  Yes. Taking medication as prescribed:  Yes. Tolerating medication:  Yes. Family/Significant other contact made:  No Patient understands diagnosis:  Yes  As evidenced by seeking help with "feeling overwhelmed" Discussing patient identified problems/goals with staff:  Yes, see initial care plan. Medical problems stabilized or resolved:  Yes. Denies suicidal/homicidal ideation: Yes. Issues/concerns per patient self-inventory:  No. Other:  New problem(s) identified:  Discharge Plan or Barriers: see below  Reason for Continuation of Hospitalization: Anxiety Depression Medication stabilization Other; describe Paranoia  Comments:  Pt shared that she is dealing with a lot of stressor and is also endorsing multiple depressive symptoms. Pt reported that she has experienced some weight changes and her appetite is poor. Pt also shared that she spends a lot of time in the bed and she is hesitant about going outside due to believing that something bad will happen. Pt reported that she is compliant with her medication but shared that she continues to have crying spells and stated "I have a deep cry". Pt shared that her therapist is in the process of referring her to the [facility]. Will discontinue Risperidone for side effects. Will start a trial of Seroquel 25 mg po qhs for psychosis/sleep. Will start Lamictal 25 mg po daily for mood sx. Will titrate higher based on response. Discussed side effects including a rash. Will taper down Trileptal to 150 mg po bid for mood sx. Will continue Prozac 20 mg po daily for PTSD sx. Will make Trazodone 100 mg po qhs prn for sleep, now that she is on Seroquel.  Estimated length of stay: 4-5 days  New goal(s):  Review of initial/current patient goals per problem list:   Review of  initial/current patient goals per problem list:  1. Goal(s): Patient will participate in aftercare plan   Met: Yes   Target date: 3-5 days post admission date   As evidenced by: Patient will participate within aftercare plan AEB aftercare provider and housing plan at discharge being identified. 01/02/16: Return home, follow up outpt   2. Goal (s): Patient will exhibit decreased depressive symptoms and suicidal ideations.   Met: No   Target date: 3-5 days post admission date   As evidenced by: Patient will utilize self rating of depression at 3 or below and demonstrate decreased signs of depression or be deemed stable for discharge by MD. 01/02/16:  Rates her depression a 10 today    3. Goal(s): Patient will demonstrate decreased signs and symptoms of anxiety.   Met: No   Target date: 3-5 days post admission date   As evidenced by: Patient will utilize self rating of anxiety at 3 or below and demonstrated decreased signs of anxiety, or be deemed stable for discharge by MD 01/02/16: Pt rates her anxiety a 10 today          5. Goal(s): Patient will demonstrate decreased signs of psychosis  * Met: No  * Target date: 3-5 days post admission date  * As evidenced by: Patient will demonstrate decreased frequency of AVH or return to baseline function 01/02/16:  C/O racing thoughts , paranoia         Attendees: Patient:  01/02/2016 8:26 AM   Family:   01/02/2016 8:26 AM   Physician:  Ursula Alert, MD 01/02/2016 8:26 AM   Nursing:   Hedy Jacob, RN  01/02/2016 8:26 AM   CSW:    Roque Lias, LCSW   01/02/2016 8:26 AM   Other:  01/02/2016 8:26 AM   Other:   01/02/2016 8:26 AM   Other:  Lars Pinks, Nurse CM 01/02/2016 8:26 AM   Other:   01/02/2016 8:26 AM   Other:  Norberto Sorenson, [place]  01/02/2016 8:26 AM   Other:  01/02/2016 8:26 AM   Other:  01/02/2016 8:26 AM   Other:  01/02/2016 8:26 AM   Other:  01/02/2016 8:26 AM   Other:  01/02/2016 8:26 AM   Other:    01/02/2016 8:26 AM    Scribe for Treatment Team:   Trish Mage, 01/02/2016 8:26 AM

## 2016-01-02 NOTE — Progress Notes (Signed)
[place] Group Notes:  (Nursing/MHT/Case Management/Adjunct)  Date:  01/02/2016  Time:  8:54 PM  Type of Therapy:  Psychoeducational Skills  Participation Level:  Active  Participation Quality:  Appropriate  Affect:  Flat  Cognitive:  Appropriate  Insight:  Good  Engagement in Group:  Developing/Improving  Modes of Intervention:  Education  Summary of Progress/Problems: The patient verbalized in group that she had a good day overall since she attended her groups and was able to rest. In terms of the theme for the day, her coping skill will be to use "deep breathing".   Archie Balboa S 01/02/2016, 8:54 PM

## 2016-01-03 DIAGNOSIS — F250 Schizoaffective disorder, bipolar type: Principal | ICD-10-CM

## 2016-01-03 LAB — URINE CULTURE: Special Requests: NORMAL

## 2016-01-03 NOTE — [facility] Group Notes (Signed)
[place] Group Notes:  (Nursing/MHT/Case Management/Adjunct)  Date:  01/03/2016  Time:  3:01 PM  Type of Therapy:  Psychoeducational Skills  Participation Level:  Active  Participation Quality:  Appropriate  Affect:  Appropriate  Cognitive:  Appropriate  Insight:  Appropriate  Engagement in Group:  Engaged  Modes of Intervention:  Discussion  Summary of Progress/Problems: Pt did attend self inventory group.     Benancio Deeds Shanta 01/03/2016, 3:01 PM

## 2016-01-03 NOTE — Progress Notes (Signed)
Patient ID: Colleen Peck, female   DOB: 01/15/1981, 35 y.o.   MRN: [id number]    D: Pt has been very tearful, flat, and depressed on the unit today. She remained to herself and did not interact a lot with peers or staff. Pt reported that she was home sick and that she just needed to be discharged. Pt did take all medications as prescribed by the doctor, no issues or concerns noted. Pt was very anxious this morning, patient was also having a hard time processing. Pt was given a dose of Seroquel and Ativan for anxiety, with relief. Pt reported that her depression was a 10, her hopelessness was a 10, and her anxiety was a 10. Pt reported that her goal for today was to go home. Pt reported being negative SI/HI, no AH/VH noted.  A: 15 min checks continued for patient safety. R: Pt safety maintained.

## 2016-01-03 NOTE — Progress Notes (Signed)
D: Pt denies any form of depression, anxiety, pain, SI, HI or AVH; states, "I don't think I was ever depressed but I was very anxious earlier; the nurse gave me some medications and I feel wonderful right now." Pt was however isolative and withdrawn to his room; was in bed for most of the evening.  Pt remained calm and cooperative through the shift assessment. A: Medications offered as prescribed.  Support, encouragement, and safe environment provided.  15-minute safety checks continue. R: Pt was med compliant.  Pt did attend wrap-up group. Safety checks continue.

## 2016-01-03 NOTE — Progress Notes (Signed)
D: Patient in bed throughout shift.  Pleasant, cooperative.  Affect blunted, blank facial expression, mood "mellow."  Denies concerns this shift. Denies SI, HI, and AVH. A:  Spoke briefly 1:1 with patient. Continues on 15 minute checks for safety. R:  Remains safe on unit.  Asleep most of shift.
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
## 2016-01-03 NOTE — [facility] Group Notes (Signed)
[place] Group Notes: (Clinical Social Work)   01/03/2016      Type of Therapy:  Group Therapy   Participation Level:  Did Not Attend despite MHT prompting - she arrived at the end of group   Selmer Dominion, [place] 01/03/2016, 4:38 PM

## 2016-01-04 MED ORDER — LAMOTRIGINE 25 MG PO TABS
25.0000 mg | ORAL_TABLET | Freq: Two times a day (BID) | ORAL | Status: DC
  Administered 2016-01-04 – 2016-01-09 (×10): 25 mg via ORAL
  Filled 2016-01-04 (×14): qty 1

## 2016-01-04 NOTE — [facility] Group Notes (Signed)
[place] Group Notes:  (Clinical Social Work)  01/04/2016  11:00AM-12:00PM  Summary of Progress/Problems:  The main focus of today's process group was to listen to a variety of genres of music and to identify that different types of music provoke different responses.  The patient then was able to identify personally what was soothing for them, as well as energizing, as well as how patient can personally use this knowledge in sleep habits, with depression, and with other symptoms.  The patient expressed at the beginning of group the overall feeling of "okay" and met with the doctor during group, came back irritated and remained that way for the remainder of group.  Type of Therapy:  Music Therapy   Participation Level:  Active  Participation Quality:  Attentive   Affect:  Flat  Cognitive:  Oriented  Insight:  Engaged  Engagement in Therapy:  Engaged  Modes of Intervention:   Activity, Exploration  Selmer Dominion, LCSW 01/04/2016
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
## 2016-01-04 NOTE — Plan of Care (Signed)
Problem: Education: Goal: Emotional status will improve Outcome: Progressing Reports feeling "mellow" today, and that her mood has improved since this morning.

## 2016-01-04 NOTE — Progress Notes (Signed)
Adult Psychoeducational Group Note  Date:  01/04/2016 Time:  8:15 PM  Group Topic/Focus:  Wrap-Up Group:   The focus of this group is to help patients review their daily goal of treatment and discuss progress on daily workbooks.  Participation Level:  Did Not Attend  Pt did not attend wrap-up group. Pt was in bed.    [name] 01/04/2016, 8:33 PM

## 2016-01-04 NOTE — Progress Notes (Signed)
DAR NOTE: Patient is sad, tearful and depressed.   Denies pain, auditory and visual hallucinations.  Rates depression at 2, hopelessness at 0, and anxiety at 3.  Maintained on routine safety checks.  Medications given as prescribed.  Support and encouragement offered as needed.  Attended group and participated.  States goal for today is "self-care."  Patient remained isolative to her room most of this shift.  Patient received Ativan and Seroquel for severe anxiety and agitation with good effect.    Patient expressing feeling of hopelessness and fear of death.  Had therapeutic discussion with patient regarding using coping skills.

## 2016-01-05 MED ORDER — TRAZODONE HCL 50 MG PO TABS: 50.0000 mg | ORAL_TABLET | Freq: Every evening | ORAL | Status: DC | PRN

## 2016-01-05 MED ORDER — NITROFURANTOIN MONOHYD MACRO 100 MG PO CAPS
100.0000 mg | ORAL_CAPSULE | Freq: Two times a day (BID) | ORAL | Status: DC
  Administered 2016-01-05 – 2016-01-09 (×8): 100 mg via ORAL
  Filled 2016-01-05 (×12): qty 1

## 2016-01-05 MED ORDER — QUETIAPINE FUMARATE 100 MG PO TABS
100.0000 mg | ORAL_TABLET | Freq: Every day | ORAL | Status: DC
  Administered 2016-01-05: 100 mg via ORAL
  Filled 2016-01-05 (×2): qty 1

## 2016-01-05 MED ORDER — QUETIAPINE FUMARATE 50 MG PO TABS
50.0000 mg | ORAL_TABLET | Freq: Every day | ORAL | Status: DC
  Filled 2016-01-05 (×2): qty 1

## 2016-01-05 NOTE — BHH Group Notes (Signed)
[facility] LCSW Aftercare Discharge Planning Group Note   01/05/2016 2:56 PM  Participation Quality:  None  Mood/Affect:  Depressed  Depression Rating:    Anxiety Rating:    Thoughts of Suicide:  No Will you contract for safety?   NA  Current AVH:  denies  Plan for Discharge/Comments:  Declined to talk in group.  When I checked in afterwards, she agreed to sign a release for her therapist at [facility], and stated that person is working on getting her a Teacher, music.  I explained that without insurance she is limited to [place] and [employer].  Stated she hates [place], and [facility] is in [employer].  Became tearful.  "Why are you doing this to me?"  Told her I would follow up with Jinny Blossom, whom she is convinced will see her for free even though I told her she needs [facility] to see the psychiatrist there.  Transportation Means:   Supports:  Roque Lias B

## 2016-01-05 NOTE — Progress Notes (Signed)
Patient ID: Colleen Peck, female   DOB: [birth year], 35 y.o.   MRN: [id number]  Urine specimen cup provided to patient and requested patient to provide a sample and return urine specimen cup. Patient verbalized understanding however has not returned at this time.

## 2016-01-05 NOTE — Progress Notes (Signed)
[facility] MD Progress Note  01/05/2016 3:10 PM Colleen Peck  MRN:  [id number]   Subjective:  Patient stated that "I am depressed , I am anxious , I cannot stay here , when can I go .'    Objective: Patient seen, chart reviewed and case discussed with the treatment team. Patient has been suffering with increased symptoms of depression, anxiety, crying's spells, disturbed sleep and appetite, isolated and withdrawn. Patient continues to be overwhelmed with all the stressors in her life and the decisions that she has to make.  Pt continues to remain tearful during evaluation and needs constant redirection and reassurance on the unit . Pt has been tolerating her medications well , will continue to support.    Principal Problem: Schizoaffective disorder, bipolar type ([place]) Diagnosis:   Patient Active Problem List   Diagnosis Date Noted  . History of borderline personality disorder [Z86.59] 01/01/2016  . Carotidynia [G90.01] 12/16/2015  . Schizoaffective disorder, bipolar type ([place]) [F25.0] 11/26/2015  . Anxiety [F41.9]   . Pain of left calf [M79.662] 11/10/2015  . PTSD (post-traumatic stress disorder) [F43.10] 09/30/2015  . Medical clearance for psychiatric admission [Z00.8]   . Paranoia ([place]) [F22]   . Anemia [D64.9] 01/30/2015  . Unspecified vitamin D deficiency [E55.9] 05/22/2013  . Hypothyroidism (acquired) [E03.9] 11/29/2011  . Obesity [E66.9] 09/08/2006  . RHINITIS, ALLERGIC [J30.9] 09/08/2006  . Asthma [J45.909] 09/08/2006  . ECZEMA, ATOPIC DERMATITIS [L20.89] 09/08/2006   Total Time spent with patient: 25 minutes  Past Psychiatric History: Patient has a past hx of bipolar disorder , PTSD , and borderline personality disorder   Past Medical History:  Past Medical History  Diagnosis Date  . Asthma   . Seasonal allergies   . Depression   . PTSD (post-traumatic stress disorder)   . Thyroid disease 2009    Graves disease (pt reported resolved); hypothyriodism  . Abnormal pap      pt reports abnl pap many years ago.  Nl since then.  . Palpitations 03/12/2008  . Bipolar disorder [facility])     Past Surgical History  Procedure Laterality Date  . Dilation and curettage of uterus  March 2006   Family History:  Family History  Problem Relation Age of Onset  . Drug abuse Father   . Depression Maternal Aunt   . Depression Maternal Grandmother   . Anxiety disorder Maternal Grandmother   . COPD Maternal Grandmother   . Suicidality Cousin   . Depression Cousin   . Bipolar disorder Cousin   . Depression Maternal Aunt   . Hypertension Mother   . Depression Mother   . Diabetes Paternal Grandfather   . COPD Paternal Grandmother   . Heart disease Neg Hx    Family Psychiatric  History: significant for unknown mental illness. Social History:  History  Alcohol Use No     History  Drug Use No    Comment: past use of marijuana in '08-'09. occasional eats brownies w/ marijuana  before thanksgiving    Social History   Social History  . Marital Status: Single    Spouse Name: N/A  . Number of Children: N/A  . Years of Education: N/A   Social History Main Topics  . Smoking status: Never Smoker   . Smokeless tobacco: Never Used  . Alcohol Use: No  . Drug Use: No     Comment: past use of marijuana in '08-'09. occasional eats brownies w/ marijuana  before thanksgiving  . Sexual Activity: Not Currently  Birth Control/ Protection: Abstinence   Other Topics Concern  . 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   Additional Social History:    History of alcohol / drug use?: No history of alcohol / drug abuse     Sleep: Fair  Appetite:  Poor  Current Medications: Current Facility-Administered Medications  Medication Dose Route Frequency Provider Last Rate Last Dose  . acetaminophen (TYLENOL) tablet 650 mg  650 mg Oral Q6H PRN Ursula Alert, MD   650 mg at 01/01/16 1728  .  alum & mag hydroxide-simeth (MAALOX/MYLANTA) 200-200-20 MG/5ML suspension 30 mL  30 mL Oral Q4H PRN Benjamine Mola, FNP      . docusate sodium (COLACE) capsule 100 mg  100 mg Oral BID Ursula Alert, MD   100 mg at 01/03/16 1656  . FLUoxetine (PROZAC) capsule 20 mg  20 mg Oral Daily Margues Filippini, MD   20 mg at 01/05/16 0820  . hydrOXYzine (ATARAX/VISTARIL) tablet 25 mg  25 mg Oral Q6H PRN Benjamine Mola, FNP   25 mg at 01/02/16 0955  . lamoTRIgine (LAMICTAL) tablet 25 mg  25 mg Oral BID Ambrose Finland, MD   25 mg at 01/05/16 0820  . levothyroxine (SYNTHROID, LEVOTHROID) tablet 112 mcg  112 mcg Oral QAC breakfast Benjamine Mola, FNP   112 mcg at 01/05/16 [id number]  . LORazepam (ATIVAN) tablet 1 mg  1 mg Oral Q6H PRN Ursula Alert, MD   1 mg at 01/04/16 2157   Or  . LORazepam (ATIVAN) injection 1 mg  1 mg Intramuscular Q6H PRN Kaylia Winborne, MD      . magnesium hydroxide (MILK OF MAGNESIA) suspension 30 mL  30 mL Oral Daily PRN Benjamine Mola, FNP   30 mL at 01/04/16 1709  . nitrofurantoin (macrocrystal-monohydrate) (MACROBID) capsule 100 mg  100 mg Oral Q12H Braylynn Lewing, MD      . OLANZapine zydis (ZYPREXA) disintegrating tablet 5 mg  5 mg Oral Q8H PRN Benjamine Mola, FNP   5 mg at 01/05/16 1204  . OXcarbazepine (TRILEPTAL) tablet 150 mg  150 mg Oral BID Ursula Alert, MD   150 mg at 01/05/16 0821  . QUEtiapine (SEROQUEL) tablet 100 mg  100 mg Oral QHS Magda Muise, MD      . QUEtiapine (SEROQUEL) tablet 50 mg  50 mg Oral TID PRN Ursula Alert, MD   50 mg at 01/04/16 1230  . traZODone (DESYREL) tablet 50 mg  50 mg Oral QHS PRN Ursula Alert, MD        Lab Results:  No results found for this or any previous visit (from the past 48 hour(s)).  Blood Alcohol level:  Lab Results  Component Value Date   ETH <5 11/25/2015   ETH <5 Q000111Q    Metabolic Disorder Labs: Lab Results  Component Value Date   HGBA1C 6.1* 08/21/2015   MPG 128 08/21/2015   Lab Results  Component  Value Date   PROLACTIN 8.8 10/05/2015   PROLACTIN 27.1* 08/21/2015   Lab Results  Component Value Date   CHOL 150 10/05/2015   TRIG 192* 10/05/2015   HDL 32* 10/05/2015   CHOLHDL 4.7 10/05/2015   VLDL 38 10/05/2015   LDLCALC 80 10/05/2015   LDLCALC 122* 08/21/2015    Physical Findings: AIMS: Facial and Oral Movements Muscles of Facial Expression: None, normal Lips and Perioral Area: None, normal Jaw: None, normal  Tongue: None, normal,Extremity Movements Upper (arms, wrists, hands, fingers): None, normal Lower (legs, knees, ankles, toes): None, normal, Trunk Movements Neck, shoulders, hips: None, normal, Overall Severity Severity of abnormal movements (highest score from questions above): None, normal Incapacitation due to abnormal movements: None, normal Patient's awareness of abnormal movements (rate only patient's report): No Awareness, Dental Status Current problems with teeth and/or dentures?: No Does patient usually wear dentures?: No  CIWA:  CIWA-Ar Total: 0 COWS:  COWS Total Score: 0  Musculoskeletal: Strength & Muscle Tone: decreased Gait & Station: normal Patient leans: N/A  Psychiatric Specialty Exam: Physical Exam  Review of Systems  Psychiatric/Behavioral: Positive for depression. The patient is nervous/anxious.   All other systems reviewed and are negative.   Blood pressure 99/62, pulse 90, temperature 98.4 F (36.9 C), temperature source Oral, resp. rate 12, height 5' 6.5" (1.689 m), weight 102.513 kg (226 lb), last menstrual period 12/23/2015, SpO2 98 %.Body mass index is 35.94 kg/(m^2).  General Appearance: Guarded  Eye Contact:  Fair  Speech:  Slow  Volume:  Decreased  Mood:  Anxious and Depressed   Affect:  Depressed and Tearful   Thought Process:  Coherent and Descriptions of Associations: Circumstantial  Orientation:  Full (Time, Place, and Person)  Thought Content:  Rumination  Suicidal Thoughts:  No but is in distress and feels people are  trying to kill her and she will die  Homicidal Thoughts:  No  Memory:  Immediate;   Fair Recent;   Fair Remote;   Fair  Judgement:  Fair  Insight:  Fair  Psychomotor Activity:  Restlessness  Concentration:  Concentration: Fair and Attention Span: Fair  Recall:  [employer] of Knowledge:  Good  Language:  Good  Akathisia:  Negative  Handed:  Right  AIMS (if indicated):     Assets:  Communication Skills Desire for Improvement Financial Resources/Insurance Housing Leisure Time Resilience Social Support Transportation  ADL's:  Intact  Cognition:  WNL  Sleep:  Number of Hours: 6.25     Treatment Plan Summary: Daily contact with patient to assess and evaluate symptoms and progress in treatment and Medication management   Plan; Colleen Peck is a 35 y.o., single AA female presented with worsening anxiety/depression and paranoia.Pt continues to be delusional and tearful and needs inpatient stay.  Will increase Seroquel to 100 po qhs for psychosis/sleep. Will continue Lamictal 25 mg po bid  for mood sx. Will titrate higher based on response.  Will taper down Trileptal to 150 mg po bid for mood sx. Prozac 20 mg po daily for PTSD sx. Trazodone 50 mg po qhs prn for sleep, now that she is on Seroquel. Will make available prn medications as per agitation protocol. Continue to monitor vitals, medication compliance and treatment side effects  Will monitor for medical issues as well as call consult as needed.  Will start Macrobid 100 mg po bid for UTI . CSW will continue working on disposition.  Patient to participate in therapeutic milieu.    Colleen Gossen, MD 01/05/2016, 3:10 PM

## 2016-01-05 NOTE — Progress Notes (Signed)
The focus of this group is to help patients review their daily goal of treatment and discuss progress on daily workbooks.  Patient attended group and participated. Patient stated that her goal today was to work on self care and rest. She stated her goal for tomorrow was the same because she always put others before herself and it causes her stress. She was advised to try to work on coping skills for her stress also. She rated her day as a 7 on a scale of 1-10

## 2016-01-05 NOTE — Progress Notes (Signed)
Recreation Therapy Notes  06.26.2017 approximately 2:30pm. Per MD order LRT met with patient to investigate ways to enhance tx during admission. Patient observed to be in bed with blankets covering face and appears to be sleeping. LRT called patient name, patient did not answer. LRT again stated patient name, patient continued to not respond. LRT will continue to attempt to assess during admission.  Laureen Ochs Celia Fried[place], LRT/CTRS   Lane Hacker 01/05/2016 3:27 PM

## 2016-01-05 NOTE — Progress Notes (Signed)
Patient ID: Grayci Kelemen, female   DOB: 16-May-1981, 35 y.o.   MRN: [id number]  DAR: Pt. Denies SI/HI and A/V Hallucinations. She reports sleep is good, appetite is good, energy level is normal, and concentration is good. She rates hopelessness 0/10, and anxiety 4/10. Patient does not report any pain or discomfort at this time. Support and encouragement provided to the patient. Scheduled medications administered to patient per physician's orders. She came to writer in the morning and reported feeling "sad." She appeared tearful but would smile inappropriately at times. Patient was later heard on the phone sobbing stating, "I want to go home. I don't want to be here anymore." Patient was agitated and experiencing paranoia at this time. When asked if patient was having racing thoughts she stated, "I'm scared I'll say too much." Interdisciplinary team reported patient was apprehensive and not trusting of staff at this time. PRN Zyprexa administered at this time. Q15 minute checks are maintained for safety.

## 2016-01-05 NOTE — Progress Notes (Signed)
D. Pt had been in room for much of the evening, chose not to attend evening group activity. Pt did come out of room for snack and to watch tv for a short time. Pt did receive bedtime medications without incident and spoke about how she is feeling better and also spoke about how she was not feeling paranoid this evening. A. Support and encouragement provided. R. Safety maintained, will continue to monitor.

## 2016-01-05 NOTE — BHH Group Notes (Signed)
[place] LCSW Group Therapy  01/05/2016 1:15 pm  Type of Therapy: Process Group Therapy  Participation Level:  Active  Participation Quality:  Appropriate  Affect:  Flat  Cognitive:  Oriented  Insight:  Improving  Engagement in Group:  Limited  Engagement in Therapy:  Limited  Modes of Intervention:  Activity, Clarification, Education, Problem-solving and Support  Summary of Progress/Problems: Today's group addressed the issue of overcoming obstacles.  Patients were asked to identify their biggest obstacle post d/c that stands in the way of their on-going success, and then problem solve as to how to manage this. Invited.  Declined to attend.  Roque Lias B 01/05/2016   3:01 PM

## 2016-01-05 NOTE — Progress Notes (Signed)
Patient ID: Colleen Peck, female   DOB: April 24, 1981, 35 y.o.   MRN: [id number]  Patient gave writer urine specimen.

## 2016-01-06 MED ORDER — ALBUTEROL SULFATE HFA 108 (90 BASE) MCG/ACT IN AERS
2.0000 | INHALATION_SPRAY | Freq: Four times a day (QID) | RESPIRATORY_TRACT | Status: DC | PRN
  Administered 2016-01-06 – 2016-01-08 (×3): 2 via RESPIRATORY_TRACT
  Filled 2016-01-06: qty 6.7

## 2016-01-06 MED ORDER — QUETIAPINE FUMARATE 25 MG PO TABS
125.0000 mg | ORAL_TABLET | Freq: Every day | ORAL | Status: DC
  Administered 2016-01-06: 125 mg via ORAL
  Filled 2016-01-06 (×3): qty 1

## 2016-01-06 MED ORDER — BECLOMETHASONE DIPROPIONATE 40 MCG/ACT IN AERS: 2.0000 | INHALATION_SPRAY | Freq: Two times a day (BID) | RESPIRATORY_TRACT | Status: DC

## 2016-01-06 MED ORDER — FLUTICASONE PROPIONATE HFA 44 MCG/ACT IN AERO
2.0000 | INHALATION_SPRAY | Freq: Two times a day (BID) | RESPIRATORY_TRACT | Status: DC
  Administered 2016-01-07 – 2016-01-13 (×11): 2 via RESPIRATORY_TRACT
  Filled 2016-01-06: qty 10.6

## 2016-01-06 NOTE — Progress Notes (Signed)
D:Patient in bed on approach.  Patient states she has been fearful today.  Patient states, "nevermind nobody believes me."  Patient appears sad and depressed.  Patient states she hopes she can better soon.  Patient denies SI/HI and denies AVH.  Patient does admit she is still paranoid. A: Staff to monitor Q 15 mins for safety.  Encouragement and support offered.  Scheduled medications administered per orders. R: Patient remains safe on the unit.  Patient attended group tonight.  Patient not visible on the unit.  Patient taking administered medications.

## 2016-01-06 NOTE — Progress Notes (Signed)
The focus of this group is to help patients review their daily goal of treatment and discuss progress on daily workbooks.  Patient attended and participated in group. Patient stated that her day was ok and that she was still resting and working on self care. Patient states that she wants to work on self control before leaving.

## 2016-01-06 NOTE — Plan of Care (Signed)
Problem: Activity: Goal: Sleeping patterns will improve Outcome: Progressing Patient sleeping 6.25-6.75 hours each night.  Problem: Coping: Goal: Ability to use eye contact when communicating with others will improve Outcome: Not Progressing Patient gives brief eye contact. Observed keeping covers over head during interaction.

## 2016-01-06 NOTE — Progress Notes (Signed)
[facility] MD Progress Note  01/06/2016 12:16 PM Colleen Peck  MRN:  [id number]   Subjective:  Patient stated that "I am OK for now."     Objective: Patient seen, chart reviewed and case discussed with the treatment team. Patient has been suffering with increased symptoms of depression, anxiety, crying's spells, disturbed sleep and appetite, isolated and withdrawn which resulted in this admission.   Patient continues to be overwhelmed with all the stressors in her life on and off and is observed on the unit as tearful and crying loud at times.  Pt continues to need a lot of redirection and support. Pt has been tolerating her medications well , will continue to support.    Principal Problem: Schizoaffective disorder, bipolar type ([place]) Diagnosis:   Patient Active Problem List   Diagnosis Date Noted  . History of borderline personality disorder [Z86.59] 01/01/2016  . Carotidynia [G90.01] 12/16/2015  . Schizoaffective disorder, bipolar type ([place]) [F25.0] 11/26/2015  . Anxiety [F41.9]   . Pain of left calf [M79.662] 11/10/2015  . PTSD (post-traumatic stress disorder) [F43.10] 09/30/2015  . Medical clearance for psychiatric admission [Z00.8]   . Paranoia ([place]) [F22]   . Anemia [D64.9] 01/30/2015  . Unspecified vitamin D deficiency [E55.9] 05/22/2013  . Hypothyroidism (acquired) [E03.9] 11/29/2011  . Obesity [E66.9] 09/08/2006  . RHINITIS, ALLERGIC [J30.9] 09/08/2006  . Asthma [J45.909] 09/08/2006  . ECZEMA, ATOPIC DERMATITIS [L20.89] 09/08/2006   Total Time spent with patient: 25 minutes  Past Psychiatric History: Patient has a past hx of bipolar disorder , PTSD , and borderline personality disorder   Past Medical History:  Past Medical History  Diagnosis Date  . Asthma   . Seasonal allergies   . Depression   . PTSD (post-traumatic stress disorder)   . Thyroid disease 2009    Graves disease (pt reported resolved); hypothyriodism  . Abnormal pap     pt reports abnl pap many  years ago.  Nl since then.  . Palpitations 03/12/2008  . Bipolar disorder [facility])     Past Surgical History  Procedure Laterality Date  . Dilation and curettage of uterus  March 2006   Family History:  Family History  Problem Relation Age of Onset  . Drug abuse Father   . Depression Maternal Aunt   . Depression Maternal Grandmother   . Anxiety disorder Maternal Grandmother   . COPD Maternal Grandmother   . Suicidality Cousin   . Depression Cousin   . Bipolar disorder Cousin   . Depression Maternal Aunt   . Hypertension Mother   . Depression Mother   . Diabetes Paternal Grandfather   . COPD Paternal Grandmother   . Heart disease Neg Hx    Family Psychiatric  History: significant for unknown mental illness. Social History:  History  Alcohol Use No     History  Drug Use No    Comment: past use of marijuana in '08-'09. occasional eats brownies w/ marijuana  before thanksgiving    Social History   Social History  . Marital Status: Single    Spouse Name: N/A  . Number of Children: N/A  . Years of Education: N/A   Social History Main Topics  . Smoking status: Never Smoker   . Smokeless tobacco: Never Used  . Alcohol Use: No  . Drug Use: No     Comment: past use of marijuana in '08-'09. occasional eats brownies w/ marijuana  before thanksgiving  . Sexual Activity: Not Currently    Birth Control/ Protection: Abstinence  Other Topics Concern  . 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   Additional Social History:    History of alcohol / drug use?: No history of alcohol / drug abuse     Sleep: Fair  Appetite:  Poor  Current Medications: Current Facility-Administered Medications  Medication Dose Route Frequency Provider Last Rate Last Dose  . acetaminophen (TYLENOL) tablet 650 mg  650 mg Oral Q6H PRN Ursula Alert, MD   650 mg at 01/01/16 1728  . alum & mag hydroxide-simeth  (MAALOX/MYLANTA) 200-200-20 MG/5ML suspension 30 mL  30 mL Oral Q4H PRN Benjamine Mola, FNP      . docusate sodium (COLACE) capsule 100 mg  100 mg Oral BID Ursula Alert, MD   100 mg at 01/06/16 0751  . FLUoxetine (PROZAC) capsule 20 mg  20 mg Oral Daily Brigg Cape, MD   20 mg at 01/06/16 0751  . hydrOXYzine (ATARAX/VISTARIL) tablet 25 mg  25 mg Oral Q6H PRN Benjamine Mola, FNP   25 mg at 01/02/16 0955  . lamoTRIgine (LAMICTAL) tablet 25 mg  25 mg Oral BID Ambrose Finland, MD   25 mg at 01/06/16 0751  . levothyroxine (SYNTHROID, LEVOTHROID) tablet 112 mcg  112 mcg Oral QAC breakfast Benjamine Mola, FNP   112 mcg at 01/06/16 [id number]  . LORazepam (ATIVAN) tablet 1 mg  1 mg Oral Q6H PRN Ursula Alert, MD   1 mg at 01/05/16 1820   Or  . LORazepam (ATIVAN) injection 1 mg  1 mg Intramuscular Q6H PRN Jhordan Kinter, MD      . magnesium hydroxide (MILK OF MAGNESIA) suspension 30 mL  30 mL Oral Daily PRN Benjamine Mola, FNP   30 mL at 01/04/16 1709  . nitrofurantoin (macrocrystal-monohydrate) (MACROBID) capsule 100 mg  100 mg Oral Q12H Maelin Kurkowski, MD   100 mg at 01/06/16 0751  . OLANZapine zydis (ZYPREXA) disintegrating tablet 5 mg  5 mg Oral Q8H PRN Benjamine Mola, FNP   5 mg at 01/05/16 1204  . OXcarbazepine (TRILEPTAL) tablet 150 mg  150 mg Oral BID Ursula Alert, MD   150 mg at 01/06/16 0751  . QUEtiapine (SEROQUEL) tablet 125 mg  125 mg Oral QHS Grover Robinson, MD      . QUEtiapine (SEROQUEL) tablet 50 mg  50 mg Oral TID PRN Ursula Alert, MD   50 mg at 01/04/16 1230  . traZODone (DESYREL) tablet 50 mg  50 mg Oral QHS PRN Ursula Alert, MD        Lab Results:  No results found for this or any previous visit (from the past 48 hour(s)).  Blood Alcohol level:  Lab Results  Component Value Date   ETH <5 11/25/2015   ETH <5 [id number]    Metabolic Disorder Labs: Lab Results  Component Value Date   HGBA1C 6.1* 08/21/2015   MPG 128 08/21/2015   Lab Results  Component Value  Date   PROLACTIN 8.8 10/05/2015   PROLACTIN 27.1* 08/21/2015   Lab Results  Component Value Date   CHOL 150 10/05/2015   TRIG 192* 10/05/2015   HDL 32* 10/05/2015   CHOLHDL 4.7 10/05/2015   VLDL 38 10/05/2015   LDLCALC 80 10/05/2015   LDLCALC 122* 08/21/2015    Physical Findings: AIMS: Facial and Oral Movements Muscles of Facial Expression: None, normal Lips and Perioral Area: None, normal Jaw: None, normal Tongue: None, normal,Extremity  Movements Upper (arms, wrists, hands, fingers): None, normal Lower (legs, knees, ankles, toes): None, normal, Trunk Movements Neck, shoulders, hips: None, normal, Overall Severity Severity of abnormal movements (highest score from questions above): None, normal Incapacitation due to abnormal movements: None, normal Patient's awareness of abnormal movements (rate only patient's report): No Awareness, Dental Status Current problems with teeth and/or dentures?: No Does patient usually wear dentures?: No  CIWA:  CIWA-Ar Total: 0 COWS:  COWS Total Score: 0  Musculoskeletal: Strength & Muscle Tone: decreased Gait & Station: normal Patient leans: N/A  Psychiatric Specialty Exam: Physical Exam  Review of Systems  Psychiatric/Behavioral: Positive for depression. The patient is nervous/anxious.   All other systems reviewed and are negative.   Blood pressure 103/72, pulse 92, temperature 98.2 F (36.8 C), temperature source Oral, resp. rate 16, height 5' 6.5" (1.689 m), weight 102.513 kg (226 lb), last menstrual period 12/23/2015, SpO2 98 %.Body mass index is 35.94 kg/(m^2).  General Appearance: Guarded  Eye Contact:  Fair  Speech:  Slow  Volume:  Decreased  Mood:  Anxious and Depressed   Affect:  Depressed and Tearful   Thought Process:  Coherent and Descriptions of Associations: Circumstantial  Orientation:  Full (Time, Place, and Person)  Thought Content:  Rumination  Suicidal Thoughts:  No but is in distress and feels people are trying  to kill her and she will die  Homicidal Thoughts:  No  Memory:  Immediate;   Fair Recent;   Fair Remote;   Fair  Judgement:  Fair  Insight:  Fair  Psychomotor Activity:  Restlessness  Concentration:  Concentration: Fair and Attention Span: Fair  Recall:  [employer] of Knowledge:  Good  Language:  Good  Akathisia:  Negative  Handed:  Right  AIMS (if indicated):     Assets:  Communication Skills Desire for Improvement Financial Resources/Insurance Housing Leisure Time Resilience Social Support Transportation  ADL's:  Intact  Cognition:  WNL  Sleep:  Number of Hours: 6.75     Treatment Plan Summary: Daily contact with patient to assess and evaluate symptoms and progress in treatment and Medication management   Plan; Colleen Peck is a 35 y.o., single AA female presented with worsening anxiety/depression and paranoia.Pt continues to be delusional and tearful and needs inpatient stay.  Will increase Seroquel to 125 mg po qhs for psychosis/sleep. Will continue Lamictal 25 mg po bid  for mood sx. Will titrate higher based on response.  Will taper down Trileptal to 150 mg po bid for mood sx. Prozac 20 mg po daily for PTSD sx. Trazodone 50 mg po qhs prn for sleep, now that she is on Seroquel. Will make available prn medications as per agitation protocol. Continue to monitor vitals, medication compliance and treatment side effects  Will monitor for medical issues as well as call consult as needed.  Will continue Macrobid 100 mg po bid for UTI . CSW will continue working on disposition.  Patient to participate in therapeutic milieu.    Graycie Halley, MD 01/06/2016, 12:16 PM

## 2016-01-06 NOTE — Progress Notes (Signed)
Recreation Therapy Notes   Animal-Assisted Activity (AAA) Program Checklist/Progress Notes Patient Eligibility Criteria Checklist & Daily Group note for Rec Tx Intervention  Date: 06.27.2017 Time: 2:45pm Location: [address]    AAA/T Program Assumption of Risk Form signed by Patient/ or Parent Legal Guardian Yes  Patient is free of allergies or sever asthma Yes  Patient reports no fear of animals Yes  Patient reports no history of cruelty to animals Yes  Patient understands his/her participation is voluntary Yes  Patient washes hands before animal contact Yes  Patient washes hands after animal contact Yes  Behavioral Response: Engaged, Attentive.   Education: Contractor, Pensions consultant   Education Outcome: Acknowledges education.   Clinical Observations/Feedback: Patient discussed with MD for appropriateness in pet therapy session. Both LRT and MD agree patient is appropriate for participation. Patient offered participation in session and signed necessary consent form without issue. Patient attended session for approximately 5 minutes, interacted with therapy dog and peers appropriately and then requested to return to her room.   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 LRT offered support and encouragement. Patient tolerated.   Colleen Peck Colleen Peck, LRT/CTRS       Colleen Peck 01/06/2016 3:10 PM

## 2016-01-06 NOTE — Progress Notes (Signed)
Met with patient 1:1 this AM. Patient with head under covers and was reluctant to remove them however did comply. Eye contact brief. Affect flat, mood depressed and anxious. Forwards minimal information. Rates her depression at a 3/10, hopelessness and anxiety at a 0/10. States her sleep, concentration and appetite are all "good" however energy level is "low." She denies pain or physical problems. States her goal for today is "self care, rest and go to groups." She denies AVH.  Medicated per orders, education provided. Self inventory reviewed. Emotional support offered.   Patient denies SI/HI and remains safe on level III obs.

## 2016-01-06 NOTE — BHH Group Notes (Signed)
[place] LCSW Group Therapy  01/06/2016 1:19 PM   Type of Therapy:  Group Therapy  Participation Level:  Active  Participation Quality:  Attentive  Affect:  Appropriate  Cognitive:  Appropriate  Insight:  Improving  Engagement in Therapy:  Engaged  Modes of Intervention:  Clarification, Education, Exploration and Socialization  Summary of Progress/Problems: Today's group focused on resilience. Reluctant participant.  Chose to not participate earlier on, but toward the end was willing to contribute.  "Why is this happening to me?  I used to work and could make decisions easily."  Went on to say that she has to live at home with mother now, but she chooses to not see that as a step backwards.  "I'm not an optimist, but I also believe that God has a plan in place, and even though I don't understand it now, things will ultimately be revealed to me."  Identified her faith as her resilience. Colleen Peck 01/06/2016 , 1:19 PM

## 2016-01-06 NOTE — Progress Notes (Signed)
Colleen Peck was up periodically during the evening, did attend and participate in evening group activity. Colleen Peck spoke about on-going feelings of anxiety and depression and spoke briefly about how she wanted to go home. She did receive bedtime medications without incident and did not verbalize any complaints of pain. A. Support and encouragement provided. R. Safety maintained, will continue to monitor.

## 2016-01-06 NOTE — BHH Group Notes (Signed)
The focus of this group is to educate the patient on the purpose and policies of crisis stabilization and provide a format to answer questions about their admission.  The group details unit policies and expectations of patients while admitted.  Patient did not attend 0900 nurse education orientation group this morning.  Patient stayed in bed.   

## 2016-01-07 LAB — URINE CULTURE: Special Requests: NORMAL

## 2016-01-07 MED ORDER — OXCARBAZEPINE 150 MG PO TABS
150.0000 mg | ORAL_TABLET | Freq: Every day | ORAL | Status: DC
  Administered 2016-01-08 – 2016-01-10 (×3): 150 mg via ORAL
  Filled 2016-01-07 (×4): qty 1

## 2016-01-07 MED ORDER — PHENYLEPH-SHARK LIV OIL-MO-PET 0.25-3-14-71.9 % RE OINT
TOPICAL_OINTMENT | Freq: Two times a day (BID) | RECTAL | Status: DC | PRN
  Administered 2016-01-07: 22:00:00 via RECTAL
  Filled 2016-01-07: qty 28.4

## 2016-01-07 MED ORDER — TRAZODONE HCL 50 MG PO TABS: 25.0000 mg | ORAL_TABLET | Freq: Every evening | ORAL | Status: DC | PRN

## 2016-01-07 MED ORDER — QUETIAPINE FUMARATE 50 MG PO TABS
150.0000 mg | ORAL_TABLET | Freq: Every day | ORAL | Status: DC
  Administered 2016-01-07: 150 mg via ORAL
  Filled 2016-01-07 (×2): qty 3

## 2016-01-07 NOTE — Progress Notes (Signed)
[facility] MD Progress Note  01/07/2016 1:18 PM Chauntell Credit  MRN:  [id number]   Subjective:  Patient stated that "I had some crying spells , I still feel unsafe about going home."      Objective: Patient seen, chart reviewed and case discussed with the treatment team. Patient has been suffering with increased symptoms of depression, anxiety, crying's spells, disturbed sleep and appetite, isolated and withdrawn which resulted in this admission.   Patient continues to be overwhelmed with all the stressors in her life on and off and is observed on the unit as tearful and crying - but is progressing . Pt continues to be paranoid and delusional and feels people are trying to kill her and feels unsafe about returning home. Pt continues to need a lot of redirection and support. Pt has been tolerating her medications well , will continue to support.    Principal Problem: Schizoaffective disorder, bipolar type ([place]) Diagnosis:   Patient Active Problem List   Diagnosis Date Noted  . History of borderline personality disorder [Z86.59] 01/01/2016  . Carotidynia [G90.01] 12/16/2015  . Schizoaffective disorder, bipolar type ([place]) [F25.0] 11/26/2015  . Anxiety [F41.9]   . Pain of left calf [M79.662] 11/10/2015  . PTSD (post-traumatic stress disorder) [F43.10] 09/30/2015  . Medical clearance for psychiatric admission [Z00.8]   . Paranoia ([place]) [F22]   . Anemia [D64.9] 01/30/2015  . Unspecified vitamin D deficiency [E55.9] 05/22/2013  . Hypothyroidism (acquired) [E03.9] 11/29/2011  . Obesity [E66.9] 09/08/2006  . RHINITIS, ALLERGIC [J30.9] 09/08/2006  . Asthma [J45.909] 09/08/2006  . ECZEMA, ATOPIC DERMATITIS [L20.89] 09/08/2006   Total Time spent with patient: 25 minutes  Past Psychiatric History: Patient has a past hx of bipolar disorder , PTSD , and borderline personality disorder   Past Medical History:  Past Medical History  Diagnosis Date  . Asthma   . Seasonal allergies   .  Depression   . PTSD (post-traumatic stress disorder)   . Thyroid disease 2009    Graves disease (pt reported resolved); hypothyriodism  . Abnormal pap     pt reports abnl pap many years ago.  Nl since then.  . Palpitations 03/12/2008  . Bipolar disorder [facility])     Past Surgical History  Procedure Laterality Date  . Dilation and curettage of uterus  March 2006   Family History:  Family History  Problem Relation Age of Onset  . Drug abuse Father   . Depression Maternal Aunt   . Depression Maternal Grandmother   . Anxiety disorder Maternal Grandmother   . COPD Maternal Grandmother   . Suicidality Cousin   . Depression Cousin   . Bipolar disorder Cousin   . Depression Maternal Aunt   . Hypertension Mother   . Depression Mother   . Diabetes Paternal Grandfather   . COPD Paternal Grandmother   . Heart disease Neg Hx    Family Psychiatric  History: significant for unknown mental illness. Social History:  History  Alcohol Use No     History  Drug Use No    Comment: past use of marijuana in '08-'09. occasional eats brownies w/ marijuana  before thanksgiving    Social History   Social History  . Marital Status: Single    Spouse Name: N/A  . Number of Children: N/A  . Years of Education: N/A   Social History Main Topics  . Smoking status: Never Smoker   . Smokeless tobacco: Never Used  . Alcohol Use: No  . Drug Use: No  Comment: past use of marijuana in '08-'09. occasional eats brownies w/ marijuana  before thanksgiving  . Sexual Activity: Not Currently    Birth Control/ Protection: Abstinence   Other Topics Concern  . 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   Additional Social History:    History of alcohol / drug use?: No history of alcohol / drug abuse     Sleep: Fair  Appetite:  Poor  Current Medications: Current Facility-Administered Medications  Medication Dose  Route Frequency Provider Last Rate Last Dose  . acetaminophen (TYLENOL) tablet 650 mg  650 mg Oral Q6H PRN Ursula Alert, MD   650 mg at 01/07/16 0845  . albuterol (PROVENTIL HFA;VENTOLIN HFA) 108 (90 Base) MCG/ACT inhaler 2 puff  2 puff Inhalation Q6H PRN Laverle Hobby, PA-C   2 puff at 01/06/16 2003  . alum & mag hydroxide-simeth (MAALOX/MYLANTA) 200-200-20 MG/5ML suspension 30 mL  30 mL Oral Q4H PRN Benjamine Mola, FNP      . docusate sodium (COLACE) capsule 100 mg  100 mg Oral BID Ursula Alert, MD   100 mg at 01/07/16 0840  . FLUoxetine (PROZAC) capsule 20 mg  20 mg Oral Daily Ursula Alert, MD   20 mg at 01/07/16 0840  . fluticasone (FLOVENT HFA) 44 MCG/ACT inhaler 2 puff  2 puff Inhalation BID Ursula Alert, MD   2 puff at 01/07/16 0841  . hydrOXYzine (ATARAX/VISTARIL) tablet 25 mg  25 mg Oral Q6H PRN Benjamine Mola, FNP   25 mg at 01/02/16 0955  . lamoTRIgine (LAMICTAL) tablet 25 mg  25 mg Oral BID Ambrose Finland, MD   25 mg at 01/07/16 0840  . levothyroxine (SYNTHROID, LEVOTHROID) tablet 112 mcg  112 mcg Oral QAC breakfast Benjamine Mola, FNP   112 mcg at 01/07/16 0840  . LORazepam (ATIVAN) tablet 1 mg  1 mg Oral Q6H PRN Ursula Alert, MD   1 mg at 01/06/16 1831   Or  . LORazepam (ATIVAN) injection 1 mg  1 mg Intramuscular Q6H PRN Kiesha Ensey, MD      . magnesium hydroxide (MILK OF MAGNESIA) suspension 30 mL  30 mL Oral Daily PRN Benjamine Mola, FNP   30 mL at 01/04/16 1709  . nitrofurantoin (macrocrystal-monohydrate) (MACROBID) capsule 100 mg  100 mg Oral Q12H Jarold Macomber, MD   100 mg at 01/07/16 0840  . OLANZapine zydis (ZYPREXA) disintegrating tablet 5 mg  5 mg Oral Q8H PRN Benjamine Mola, FNP   5 mg at 01/05/16 1204  . OXcarbazepine (TRILEPTAL) tablet 150 mg  150 mg Oral BID Ursula Alert, MD   150 mg at 01/07/16 0840  . QUEtiapine (SEROQUEL) tablet 125 mg  125 mg Oral QHS Ursula Alert, MD   125 mg at 01/06/16 2225  . QUEtiapine (SEROQUEL) tablet 50 mg  50 mg  Oral TID PRN Ursula Alert, MD   50 mg at 01/04/16 1230  . traZODone (DESYREL) tablet 50 mg  50 mg Oral QHS PRN Ursula Alert, MD        Lab Results:  Results for orders placed or performed during the hospital encounter of 12/31/15 (from the past 48 hour(s))  Urine culture     Status: Abnormal   Collection Time: 01/05/16  5:22 PM  Result Value Ref Range   Specimen Description      URINE, RANDOM Performed at [facility]  Special Requests      Normal Performed at [facility]    Culture MULTIPLE SPECIES PRESENT, SUGGEST RECOLLECTION (A)    Report Status 01/07/2016 FINAL     Blood Alcohol level:  Lab Results  Component Value Date   ETH <5 11/25/2015   ETH <5 [id number]    Metabolic Disorder Labs: Lab Results  Component Value Date   HGBA1C 6.1* 08/21/2015   MPG 128 08/21/2015   Lab Results  Component Value Date   PROLACTIN 8.8 10/05/2015   PROLACTIN 27.1* 08/21/2015   Lab Results  Component Value Date   CHOL 150 10/05/2015   TRIG 192* 10/05/2015   HDL 32* 10/05/2015   CHOLHDL 4.7 10/05/2015   VLDL 38 10/05/2015   LDLCALC 80 10/05/2015   LDLCALC 122* 08/21/2015    Physical Findings: AIMS: Facial and Oral Movements Muscles of Facial Expression: None, normal Lips and Perioral Area: None, normal Jaw: None, normal Tongue: None, normal,Extremity Movements Upper (arms, wrists, hands, fingers): None, normal Lower (legs, knees, ankles, toes): None, normal, Trunk Movements Neck, shoulders, hips: None, normal, Overall Severity Severity of abnormal movements (highest score from questions above): None, normal Incapacitation due to abnormal movements: None, normal Patient's awareness of abnormal movements (rate only patient's report): No Awareness, Dental Status Current problems with teeth and/or dentures?: No Does patient usually wear dentures?: No  CIWA:  CIWA-Ar Total: 0 COWS:  COWS Total Score: 0  Musculoskeletal: Strength  & Muscle Tone: decreased Gait & Station: normal Patient leans: N/A  Psychiatric Specialty Exam: Physical Exam  Review of Systems  Psychiatric/Behavioral: Positive for depression. The patient is nervous/anxious.   All other systems reviewed and are negative.   Blood pressure 89/61, pulse 80, temperature 98.1 F (36.7 C), temperature source Oral, resp. rate 16, height 5' 6.5" (1.689 m), weight 102.513 kg (226 lb), last menstrual period 12/23/2015, SpO2 98 %.Body mass index is 35.94 kg/(m^2).  General Appearance: Guarded  Eye Contact:  Fair  Speech:  Slow  Volume:  Decreased  Mood:  Anxious and Depressed   Affect:  Depressed and Tearful on and off   Thought Process:  Coherent and Descriptions of Associations: Circumstantial  Orientation:  Full (Time, Place, and Person)  Thought Content:  Rumination  Suicidal Thoughts:  No but is in distress and feels people are trying to kill her and she will die  Homicidal Thoughts:  No  Memory:  Immediate;   Fair Recent;   Fair Remote;   Fair  Judgement:  Fair  Insight:  Fair  Psychomotor Activity:  Restlessness  Concentration:  Concentration: Fair and Attention Span: Fair  Recall:  [employer] of Knowledge:  Good  Language:  Good  Akathisia:  Negative  Handed:  Right  AIMS (if indicated):     Assets:  Communication Skills Desire for Improvement Financial Resources/Insurance Housing Leisure Time Resilience Social Support Transportation  ADL's:  Intact  Cognition:  WNL  Sleep:  Number of Hours: 6.75     Treatment Plan Summary: Daily contact with patient to assess and evaluate symptoms and progress in treatment and Medication management   Plan; Suong Bienaime is a 35 y.o., single AA female presented with worsening anxiety/depression and paranoia.Pt continues to be delusional and tearful and needs inpatient stay.  Will increase Seroquel to 150 mg po qhs for psychosis/sleep. Will continue Lamictal 25 mg po bid  for mood sx. Will  titrate higher based on response.  Will taper down Trileptal to 150 mg po bid  for mood sx. Prozac 20 mg po daily for PTSD sx. Trazodone 50 mg po qhs prn for sleep, now that she is on Seroquel. Will make available prn medications as per agitation protocol. Continue to monitor vitals, medication compliance and treatment side effects  Will monitor for medical issues as well as call consult as needed.  Will continue Macrobid 100 mg po bid for UTI . CSW will continue working on disposition.  Patient to participate in therapeutic milieu.    Tessla Spurling, MD 01/07/2016, 1:18 PM

## 2016-01-07 NOTE — Tx Team (Signed)
Interdisciplinary Treatment Plan Update (Adult)  Date:  01/07/2016   Time Reviewed:  8:42 AM   Progress in Treatment: Attending groups: Yes. Participating in groups:  Yes. Taking medication as prescribed:  Yes. Tolerating medication:  Yes. Family/Significant other contact made:  No Patient understands diagnosis:  Yes  As evidenced by seeking help with "feeling overwhelmed" Discussing patient identified problems/goals with staff:  Yes, see initial care plan. Medical problems stabilized or resolved:  Yes. Denies suicidal/homicidal ideation: Yes. Issues/concerns per patient self-inventory:  No. Other:  New problem(s) identified:  Discharge Plan or Barriers: see below  Reason for Continuation of Hospitalization: Medication stabilization Other; describe Paranoia  Comments:  Pt shared that she is dealing with a lot of stressor and is also endorsing multiple depressive symptoms. Pt reported that she has experienced some weight changes and her appetite is poor. Pt also shared that she spends a lot of time in the bed and she is hesitant about going outside due to believing that something bad will happen. Pt reported that she is compliant with her medication but shared that she continues to have crying spells and stated "I have a deep cry". Pt shared that her therapist is in the process of referring her to the [facility]. Will discontinue Risperidone for side effects. Will start a trial of Seroquel 25 mg po qhs for psychosis/sleep. Will start Lamictal 25 mg po daily for mood sx. Will titrate higher based on response. Discussed side effects including a rash. Will taper down Trileptal to 150 mg po bid for mood sx. Will continue Prozac 20 mg po daily for PTSD sx. Will make Trazodone 100 mg po qhs prn for sleep, now that she is on Seroquel.Will increase Seroquel to 150 mg po qhs for psychosis/sleep. Will continue Lamictal 25 mg po bid for mood sx. Will titrate higher based on response.   Will taper down Trileptal to 150 mg po bid for mood sx. Prozac 20 mg po daily for PTSD sx. Trazodone 50 mg po qhs prn for sleep, now that she is on Seroquel.  01/07/16:    Estimated length of stay: 2-5 days  New goal(s):  Review of initial/current patient goals per problem list:   Review of initial/current patient goals per problem list:  1. Goal(s): Patient will participate in aftercare plan   Met: Yes   Target date: 3-5 days post admission date   As evidenced by: Patient will participate within aftercare plan AEB aftercare provider and housing plan at discharge being identified. 01/02/16: Return home, follow up outpt   2. Goal (s): Patient will exhibit decreased depressive symptoms and suicidal ideations.   Met: Yes   Target date: 3-5 days post admission date   As evidenced by: Patient will utilize self rating of depression at 3 or below and demonstrate decreased signs of depression or be deemed stable for discharge by MD. 01/02/16:  Rates her depression a 10 today 01/07/16:  Rates depression a 3 today    3. Goal(s): Patient will demonstrate decreased signs and symptoms of anxiety.   Met: Yes   Target date: 3-5 days post admission date   As evidenced by: Patient will utilize self rating of anxiety at 3 or below and demonstrated decreased signs of anxiety, or be deemed stable for discharge by MD 01/02/16: Pt rates her anxiety a 10 today 01/07/16:  Denies anxiety today.         5. Goal(s): Patient will demonstrate decreased signs of psychosis  * Met: No  *  Target date: 3-5 days post admission date  * As evidenced by: Patient will demonstrate decreased frequency of AVH or return to baseline function 01/02/16:  C/O racing thoughts , paranoia 01/07/16:  Continues to exhibit some paranoia, states she would feel unsafe if she left here now         Attendees: Patient:  01/07/2016 8:42 AM   Family:   01/07/2016 8:42 AM   Physician:  Ursula Alert,  MD 01/07/2016 8:42 AM   Nursing:   Hedy Jacob, RN 01/07/2016 8:42 AM   CSW:    Roque Lias, LCSW   01/07/2016 8:42 AM   Other:  01/07/2016 8:42 AM   Other:   01/07/2016 8:42 AM   Other:  Lars Pinks, Nurse CM 01/07/2016 8:42 AM   Other:   01/07/2016 8:42 AM   Other:  Norberto Sorenson, [place]  01/07/2016 8:42 AM   Other:  01/07/2016 8:42 AM   Other:  01/07/2016 8:42 AM   Other:  01/07/2016 8:42 AM   Other:  01/07/2016 8:42 AM   Other:  01/07/2016 8:42 AM   Other:   01/07/2016 8:42 AM    Scribe for Treatment Team:   Trish Mage, 01/07/2016 8:42 AM

## 2016-01-07 NOTE — Progress Notes (Signed)
[facility] Notes:  (Nursing/MHT/Case Management/Adjunct)  Date:  01/07/2016  Time:  8:33 PM  Type of Therapy:  Psychoeducational Skills  Participation Level:  Active  Participation Quality:  Appropriate  Affect:  Blunted  Cognitive:  Appropriate  Insight:  Improving  Engagement in Group:  Developing/Improving  Modes of Intervention:  Education  Summary of Progress/Problems: The patient shared with the group that she had a good day since she was able to go outside for fresh air and because she had a better appetite. As a theme for the day, her plan for development is to work on remaining out of the hospital.   Gennette Pac 01/07/2016, 8:33 PM

## 2016-01-07 NOTE — Progress Notes (Signed)
Patient ID: Colleen Peck, female   DOB: [birth year], 35 y.o.   MRN: [id number]  DAR: Pt. Denies SI/HI and A/V Hallucinations. She reports sleep is good, appetite is good, energy level is normal, and concentration is good. She rates depression, anxiety, and hopelessness 0/10. Writer has not observed any crying spells by this patient during this shift thus far. This is improvement from writer's last interaction with patient on Monday. She reports her goal today is self care and she plans on meeting this goal by attending groups and getting rest. Patient continues to report pain in her rectal area she reports is due to a "hard" BM she had a couple days ago. Patient is receiving PRN Tylenol for pain and scheduled Colace as well. Writer encouraged patient to speak with MD about this and patient verbalized understanding. Support and encouragement provided to the patient. Scheduled medications administered to patient per physician's orders. Patient is assertive with her interaction and cooperative. Q15 minute checks are maintained for safety.

## 2016-01-07 NOTE — BHH Group Notes (Signed)
[facility] Group Therapy  01/07/2016 , 4:32 PM    Type of Therapy:  Mental Health Association Presentation  Participation Level:  Active  Participation Quality:  Attentive  Affect:  Blunted  Cognitive:  Oriented  Insight:  Limited  Engagement in Therapy:  Engaged  Modes of Intervention:  Discussion, Education and Socialization  Summary of Progress/Problems:  Shanon Brow from [place] came to present his recovery story and play the guitar.  Stayed the entire time.  Engaged throughout.  Roque Lias B 01/07/2016 , 4:32 PM

## 2016-01-07 NOTE — Progress Notes (Signed)
Recreation Therapy Notes  06.28.2017 approximately 2:45pm. Per MD order LRT met with patient to investigate ways to enhance tx during admission. Patient reports she was admitted because "my meds are not working." Patient additionally reports an increase in anxiety, and that she has tried deep breathing and exercise as coping skills for anxiety, but has not found them to be helpful long term. Patient reports she is paranoid about leaving her home because she or her family will be hurt. 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 Patient demonstrated general paranoia, asking LRT for confirmation for inconsequential things, for example she has been exercising and states she lost 60 pounds, patient asked "that's not good right?" LRT reassured patient about expressed concerns. Patient expressed interest in yoga and walking. LRT offered to provide information on community resources that align with patient expressed interest, patient hesitant, stating she does not want to run into people in the community. Patient will not identify the individuals she is concerned about running into. LRT asked patient to consider using resources, patient agrees. LRT to follow up.   Laureen Ochs Abimael Zeiter, LRT/CTRS   Lane Hacker 01/07/2016 3:29 PM

## 2016-01-07 NOTE — BHH Group Notes (Signed)
[facility] LCSW Aftercare Discharge Planning Group Note   01/07/2016 1:25 PM  Participation Quality:  Minimal  Mood/Affect:  Depressed  Depression Rating:    Anxiety Rating:    Thoughts of Suicide:  No Will you contract for safety?   NA  Current AVH:  denies  Plan for Discharge/Comments:  Although she is reluctant to do so, Colleen Peck signed a release for [facility] to see a Dr at d/c.  Did not want to go to [facility], and is frustrated that she cannot go to the psychiatrist of her choosing.  Apologized for insisting that i meet with her individually, and explained she is a private person, and does not like her business being discussed in public.  Transportation Means:   Supports:  Roque Lias B

## 2016-01-08 MED ORDER — IBUPROFEN 400 MG PO TABS
400.0000 mg | ORAL_TABLET | Freq: Four times a day (QID) | ORAL | Status: DC | PRN
  Administered 2016-01-08 – 2016-01-10 (×5): 400 mg via ORAL
  Filled 2016-01-08 (×5): qty 1

## 2016-01-08 MED ORDER — POLYETHYLENE GLYCOL 3350 17 G PO PACK
17.0000 g | PACK | Freq: Every day | ORAL | Status: DC
  Administered 2016-01-08 – 2016-01-10 (×3): 17 g via ORAL
  Filled 2016-01-08: qty 1
  Filled 2016-01-08: qty 7
  Filled 2016-01-08 (×5): qty 1

## 2016-01-08 MED ORDER — QUETIAPINE FUMARATE 200 MG PO TABS
200.0000 mg | ORAL_TABLET | Freq: Every day | ORAL | Status: DC
  Administered 2016-01-08: 200 mg via ORAL
  Filled 2016-01-08 (×3): qty 1

## 2016-01-08 NOTE — BHH Group Notes (Signed)
Pt attended [place] group and watched her peers sing.  Victorino Sparrow, MHT

## 2016-01-08 NOTE — Progress Notes (Signed)
[facility] MD Progress Note  01/08/2016 12:10 PM Colleen Peck  MRN:  [id number]   Subjective:  Patient stated that "I am ok, my crying spells are better."      Objective: Patient seen, chart reviewed and case discussed with the treatment team. Patient has been suffering with increased symptoms of depression, anxiety, crying's spells, disturbed sleep and appetite, isolated and withdrawn which resulted in this admission.   Patient today is seen as depressed , reports she is improving and her crying spells have reduced. Pt continues to be overwhelmed with all the stressors in her life - but reports she is working on her coping skills. Pt continues to have paranoia and delusions - feels people can kill her once she gets out of the hospital - today reports that it is on and off - improving. Pt continues to need a lot of redirection and support. Pt has been tolerating her medications well , will continue to support. Per staff - pt with hx of hemorrhoids , c/o pain - provided symptomatic treatment.   Principal Problem: Schizoaffective disorder, bipolar type ([place]) Diagnosis:   Patient Active Problem List   Diagnosis Date Noted  . History of borderline personality disorder [Z86.59] 01/01/2016  . Carotidynia [G90.01] 12/16/2015  . Schizoaffective disorder, bipolar type ([place]) [F25.0] 11/26/2015  . Anxiety [F41.9]   . Pain of left calf [M79.662] 11/10/2015  . PTSD (post-traumatic stress disorder) [F43.10] 09/30/2015  . Medical clearance for psychiatric admission [Z00.8]   . Paranoia ([place]) [F22]   . Anemia [D64.9] 01/30/2015  . Unspecified vitamin D deficiency [E55.9] 05/22/2013  . Hypothyroidism (acquired) [E03.9] 11/29/2011  . Obesity [E66.9] 09/08/2006  . RHINITIS, ALLERGIC [J30.9] 09/08/2006  . Asthma [J45.909] 09/08/2006  . ECZEMA, ATOPIC DERMATITIS [L20.89] 09/08/2006   Total Time spent with patient: 25 minutes  Past Psychiatric History: Patient has a past hx of bipolar disorder , PTSD  , and borderline personality disorder   Past Medical History:  Past Medical History  Diagnosis Date  . Asthma   . Seasonal allergies   . Depression   . PTSD (post-traumatic stress disorder)   . Thyroid disease 2009    Graves disease (pt reported resolved); hypothyriodism  . Abnormal pap     pt reports abnl pap many years ago.  Nl since then.  . Palpitations 03/12/2008  . Bipolar disorder [facility])     Past Surgical History  Procedure Laterality Date  . Dilation and curettage of uterus  March 2006   Family History:  Family History  Problem Relation Age of Onset  . Drug abuse Father   . Depression Maternal Aunt   . Depression Maternal Grandmother   . Anxiety disorder Maternal Grandmother   . COPD Maternal Grandmother   . Suicidality Cousin   . Depression Cousin   . Bipolar disorder Cousin   . Depression Maternal Aunt   . Hypertension Mother   . Depression Mother   . Diabetes Paternal Grandfather   . COPD Paternal Grandmother   . Heart disease Neg Hx    Family Psychiatric  History: significant for unknown mental illness. Social History:  History  Alcohol Use No     History  Drug Use No    Comment: past use of marijuana in '08-'09. occasional eats brownies w/ marijuana  before thanksgiving    Social History   Social History  . Marital Status: Single    Spouse Name: N/A  . Number of Children: N/A  . Years of Education: N/A  Social History Main Topics  . Smoking status: Never Smoker   . Smokeless tobacco: Never Used  . Alcohol Use: No  . Drug Use: No     Comment: past use of marijuana in '08-'09. occasional eats brownies w/ marijuana  before thanksgiving  . Sexual Activity: Not Currently    Birth Control/ Protection: Abstinence   Other Topics Concern  . 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   Additional Social History:    History of alcohol / drug use?: No history of  alcohol / drug abuse     Sleep: Fair  Appetite:  Poor improving  Current Medications: Current Facility-Administered Medications  Medication Dose Route Frequency Provider Last Rate Last Dose  . acetaminophen (TYLENOL) tablet 650 mg  650 mg Oral Q6H PRN Ursula Alert, MD   650 mg at 01/07/16 1557  . albuterol (PROVENTIL HFA;VENTOLIN HFA) 108 (90 Base) MCG/ACT inhaler 2 puff  2 puff Inhalation Q6H PRN Laverle Hobby, PA-C   2 puff at 01/07/16 1555  . alum & mag hydroxide-simeth (MAALOX/MYLANTA) 200-200-20 MG/5ML suspension 30 mL  30 mL Oral Q4H PRN Benjamine Mola, FNP      . docusate sodium (COLACE) capsule 100 mg  100 mg Oral BID Ursula Alert, MD   100 mg at 01/08/16 0823  . FLUoxetine (PROZAC) capsule 20 mg  20 mg Oral Daily Ursula Alert, MD   20 mg at 01/08/16 0823  . fluticasone (FLOVENT HFA) 44 MCG/ACT inhaler 2 puff  2 puff Inhalation BID Ursula Alert, MD   2 puff at 01/08/16 0824  . hydrOXYzine (ATARAX/VISTARIL) tablet 25 mg  25 mg Oral Q6H PRN Benjamine Mola, FNP   25 mg at 01/02/16 0955  . lamoTRIgine (LAMICTAL) tablet 25 mg  25 mg Oral BID Ambrose Finland, MD   25 mg at 01/08/16 0824  . levothyroxine (SYNTHROID, LEVOTHROID) tablet 112 mcg  112 mcg Oral QAC breakfast Benjamine Mola, FNP   112 mcg at 01/08/16 [id number]  . LORazepam (ATIVAN) tablet 1 mg  1 mg Oral Q6H PRN Ursula Alert, MD   1 mg at 01/08/16 1138   Or  . LORazepam (ATIVAN) injection 1 mg  1 mg Intramuscular Q6H PRN Olyvia Gopal, MD      . magnesium hydroxide (MILK OF MAGNESIA) suspension 30 mL  30 mL Oral Daily PRN Benjamine Mola, FNP   30 mL at 01/04/16 1709  . nitrofurantoin (macrocrystal-monohydrate) (MACROBID) capsule 100 mg  100 mg Oral Q12H Hektor Huston, MD   100 mg at 01/08/16 0825  . OLANZapine zydis (ZYPREXA) disintegrating tablet 5 mg  5 mg Oral Q8H PRN Benjamine Mola, FNP   5 mg at 01/05/16 1204  . OXcarbazepine (TRILEPTAL) tablet 150 mg  150 mg Oral Daily Ursula Alert, MD   150 mg at  01/08/16 0825  . phenylephrine-shark liver oil-mineral oil-petrolatum (PREPARATION H) rectal ointment   Rectal BID PRN Laverle Hobby, PA-C      . QUEtiapine (SEROQUEL) tablet 200 mg  200 mg Oral QHS Harold Mattes, MD      . QUEtiapine (SEROQUEL) tablet 50 mg  50 mg Oral TID PRN Ursula Alert, MD   50 mg at 01/04/16 1230  . traZODone (DESYREL) tablet 25 mg  25 mg Oral QHS PRN Ursula Alert, MD        Lab Results:  No results found for this or  any previous visit (from the past 48 hour(s)).  Blood Alcohol level:  Lab Results  Component Value Date   ETH <5 11/25/2015   ETH <5 [id number]    Metabolic Disorder Labs: Lab Results  Component Value Date   HGBA1C 6.1* 08/21/2015   MPG 128 08/21/2015   Lab Results  Component Value Date   PROLACTIN 8.8 10/05/2015   PROLACTIN 27.1* 08/21/2015   Lab Results  Component Value Date   CHOL 150 10/05/2015   TRIG 192* 10/05/2015   HDL 32* 10/05/2015   CHOLHDL 4.7 10/05/2015   VLDL 38 10/05/2015   LDLCALC 80 10/05/2015   LDLCALC 122* 08/21/2015    Physical Findings: AIMS: Facial and Oral Movements Muscles of Facial Expression: None, normal Lips and Perioral Area: None, normal Jaw: None, normal Tongue: None, normal,Extremity Movements Upper (arms, wrists, hands, fingers): None, normal Lower (legs, knees, ankles, toes): None, normal, Trunk Movements Neck, shoulders, hips: None, normal, Overall Severity Severity of abnormal movements (highest score from questions above): None, normal Incapacitation due to abnormal movements: None, normal Patient's awareness of abnormal movements (rate only patient's report): No Awareness, Dental Status Current problems with teeth and/or dentures?: No Does patient usually wear dentures?: No  CIWA:  CIWA-Ar Total: 0 COWS:  COWS Total Score: 0  Musculoskeletal: Strength & Muscle Tone: decreased Gait & Station: normal Patient leans: N/A  Psychiatric Specialty Exam: Physical Exam  Review of  Systems  Psychiatric/Behavioral: Positive for depression. The patient is nervous/anxious.   All other systems reviewed and are negative.   Blood pressure 103/64, pulse 91, temperature 98.4 F (36.9 C), temperature source Oral, resp. rate 18, height 5' 6.5" (1.689 m), weight 102.513 kg (226 lb), last menstrual period 12/23/2015, SpO2 98 %.Body mass index is 35.94 kg/(m^2).  General Appearance: Guarded  Eye Contact:  Fair  Speech:  Slow  Volume:  Decreased  Mood:  Anxious and Depressed   Affect:  Depressed and Tearful on and off   Thought Process:  Coherent and Descriptions of Associations: Circumstantial  Orientation:  Full (Time, Place, and Person)  Thought Content:  Delusions, Paranoid Ideation and Rumination not too focussed on it  Suicidal Thoughts:  No but is in distress and feels people are trying to kill her and she will die  Homicidal Thoughts:  No  Memory:  Immediate;   Fair Recent;   Fair Remote;   Fair  Judgement:  Fair  Insight:  Fair  Psychomotor Activity:  Restlessness  Concentration:  Concentration: Fair and Attention Span: Fair  Recall:  [employer] of Knowledge:  Good  Language:  Good  Akathisia:  Negative  Handed:  Right  AIMS (if indicated):     Assets:  Communication Skills Desire for Improvement Financial Resources/Insurance Housing Leisure Time Resilience Social Support Transportation  ADL's:  Intact  Cognition:  WNL  Sleep:  Number of Hours: 6.5     Treatment Plan Summary: Daily contact with patient to assess and evaluate symptoms and progress in treatment and Medication management   Plan; Colleen Peck is a 35 y.o., single AA female presented with worsening anxiety/depression and paranoia.Pt continues to be delusional and tearful, although progressing -  and needs inpatient stay.  Will increase Seroquel to 200 mg po qhs for psychosis/sleep. Pt is very sensitive to medications and hence dose can be titrated up very slowly. Will continue  Lamictal 25 mg po bid  for mood sx. Will titrate higher based on response.  Will taper down Trileptal to 150 mg  po bid for mood sx. Prozac 20 mg po daily for PTSD sx. Trazodone 50 mg po qhs prn for sleep, now that she is on Seroquel. Will make available prn medications as per agitation protocol. Continue to monitor vitals, medication compliance and treatment side effects  Will monitor for medical issues as well as call consult as needed.  Will continue Macrobid 100 mg po bid for UTI . CSW will continue working on disposition.  Patient to participate in therapeutic milieu.    Jenell Dobransky, MD 01/08/2016, 12:10 PM

## 2016-01-08 NOTE — Progress Notes (Signed)
Recreation Therapy Notes  06.29.2017 approximately 3:15pm. LRT returned to work on stress management techniques with patient. Patient approached in bed with covers pulled to her face. Patient presents with flat affect and appropriate eye contact. Patient practiced deep breathing and safe place visualization with LRT. Patient expressed feeling of calm following participation. 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 LRT encouraged patient to discuss fears with MD, as patient states she has not been discussing these fears with MD.   Patient continues to express hesitation regarding resources introduced during previous 1:1. LRT to provide prior to patient d/c.   Laureen Ochs Kylian Loh, LRT/CTRS         Yasseen Salls L 01/08/2016 5:00 PM

## 2016-01-08 NOTE — Progress Notes (Signed)
DAR NOTE: Patient presents with anxious affect and depressed mood.  Denies auditory and visual hallucinations.  Rates depression at 0, hopelessness at 0, and anxiety at 5.  Maintained on routine safety checks.  Medications given as prescribed.  Support and encouragement offered as needed.  Attended group and participated.  States goal for today is "self care and discharge."  Patient more visible in milieu today.  Ativan 1 mg given for anxiety and agitation with good effect.  Motrin 400 mg for inflammation and hemorrhoid pain with good effect.

## 2016-01-08 NOTE — Progress Notes (Signed)
D:Patient states she had a much better day today although she had some crying spells.  Patient states, "I just don't want people to think I am crazy."  Patient states she is worried she will no longer be able to work.  Patient states she is going to take it one day at a time.  Patient denies SI/HI and denies AVH. A: Staff to monitor Q 15 mins for safety.  Encouragement and support offered.  Scheduled medications administered per orders. R: Patient remains safe on the unit.  Patient attended group tonight.  Patient visible on the unit.  Patient taking administered medications.

## 2016-01-08 NOTE — [facility] Group Notes (Signed)
[facility] Group Notes:  (Counselor/Nursing/MHT/Case Management/Adjunct)  01/08/2016 1:15PM  Type of Therapy:  Group Therapy  Participation Level:  Active  Participation Quality:  Appropriate  Affect:  Flat  Cognitive:  Oriented  Insight:  Improving  Engagement in Group:  Limited  Engagement in Therapy:  Limited  Modes of Intervention:  Discussion, Exploration and Socialization  Summary of Progress/Problems: The topic for group was balance in life.  Pt participated in the discussion about when their life was in balance and out of balance and how this feels.  Pt discussed ways to get back in balance and short term goals they can work on to get where they want to be. Stayed the entire time, engaged throughout.  "I'm concerned about not having a job and income, but I am not as worried about it as I was before I came in. I think the medication helps, and the reason they are working is because Dr E worked with me to identify something that would be effective, and not make me feel bad at the same time."     Colleen Peck B 01/08/2016 3:50 PM

## 2016-01-08 NOTE — Progress Notes (Signed)
Pt came to writer requesting something for the pain in her rectum, stating that it is very uncomfortable and started after she had a BM of hard stool earlier in the day.  Pt states that it is uncomfortable to sit.  The provider on call was contacted and an order for Preparation H was obtained and given to the patient to apply to her anal area.  Pt denies SI/HI/AVH at this time.  She makes her needs known to staff.  She has been appropriate and cooperative.  Support and encouragement offered.  Discharge plans are in process.  Safety maintained with q15 minute checks.

## 2016-01-09 MED ORDER — LAMOTRIGINE 25 MG PO TABS
50.0000 mg | ORAL_TABLET | Freq: Every evening | ORAL | Status: DC
Start: 2016-01-09 — End: 2016-01-13
  Administered 2016-01-09 – 2016-01-12 (×4): 50 mg via ORAL
  Filled 2016-01-09 (×6): qty 2

## 2016-01-09 MED ORDER — LEVOTHYROXINE SODIUM 112 MCG PO TABS
224.0000 ug | ORAL_TABLET | Freq: Every day | ORAL | Status: DC
  Administered 2016-01-10 – 2016-01-13 (×4): 224 ug via ORAL
  Filled 2016-01-09 (×2): qty 2
  Filled 2016-01-09: qty 6
  Filled 2016-01-09 (×3): qty 2

## 2016-01-09 MED ORDER — NITROFURANTOIN MONOHYD MACRO 100 MG PO CAPS
100.0000 mg | ORAL_CAPSULE | Freq: Two times a day (BID) | ORAL | Status: AC
Start: 2016-01-09 — End: 2016-01-11
  Administered 2016-01-09 – 2016-01-11 (×4): 100 mg via ORAL
  Filled 2016-01-09 (×4): qty 1

## 2016-01-09 MED ORDER — QUETIAPINE FUMARATE 50 MG PO TABS
250.0000 mg | ORAL_TABLET | Freq: Every day | ORAL | Status: DC
  Administered 2016-01-09 – 2016-01-10 (×2): 250 mg via ORAL
  Filled 2016-01-09 (×3): qty 1

## 2016-01-09 MED ORDER — LAMOTRIGINE 25 MG PO TABS
25.0000 mg | ORAL_TABLET | Freq: Every day | ORAL | Status: DC
  Administered 2016-01-10 – 2016-01-13 (×4): 25 mg via ORAL
  Filled 2016-01-09: qty 21
  Filled 2016-01-09 (×5): qty 1

## 2016-01-09 NOTE — Progress Notes (Signed)
[facility] Group Notes:  (Nursing/MHT/Case Management/Adjunct)  Date:  01/09/2016  Time:  9:35 PM  Type of Therapy:  Psychoeducational Skills  Participation Level:  Minimal  Participation Quality:  Resistant  Affect:  Flat  Cognitive:  Lacking  Insight:  Lacking  Engagement in Group:  Resistant  Modes of Intervention:  Education  Summary of Progress/Problems: The patient verbalized that she was pleased with the fact that she just "woke up" today but had nothing further to add to the discussion. As for the theme of the day, her relapse prevention will involve getting more exercise.   Archie Balboa S 01/09/2016, 9:35 PM

## 2016-01-09 NOTE — Progress Notes (Addendum)
[facility] MD Progress Note  01/09/2016 2:28 PM Colleen Peck  MRN:  [id number]   Subjective:  Patient stated that "I am worried about gang members here who may be out to get me."       Objective: Patient seen, chart reviewed and case discussed with the treatment team. Patient has been suffering with increased symptoms of depression, anxiety, crying's spells, disturbed sleep and appetite, isolated and withdrawn which resulted in this admission.   Patient today is seen as depressed , continues to have some crying spells on and off . Pt continues to be paranoid and delusional - is worried about her safety - feels gang members on the unit are trying to get her. She continues to worry about her safety outside of here. Pt continues to be overwhelmed with all the stressors in her life - but reports she is working on her coping skills. Pt continues to need a lot of redirection and support. Pt has been tolerating her medications well , will continue to support.   Principal Problem: Schizoaffective disorder, bipolar type ([place]) Diagnosis:   Patient Active Problem List   Diagnosis Date Noted  . History of borderline personality disorder [Z86.59] 01/01/2016  . Carotidynia [G90.01] 12/16/2015  . Schizoaffective disorder, bipolar type ([place]) [F25.0] 11/26/2015  . Anxiety [F41.9]   . Pain of left calf [M79.662] 11/10/2015  . PTSD (post-traumatic stress disorder) [F43.10] 09/30/2015  . Medical clearance for psychiatric admission [Z00.8]   . Paranoia ([place]) [F22]   . Anemia [D64.9] 01/30/2015  . Unspecified vitamin D deficiency [E55.9] 05/22/2013  . Hypothyroidism (acquired) [E03.9] 11/29/2011  . Obesity [E66.9] 09/08/2006  . RHINITIS, ALLERGIC [J30.9] 09/08/2006  . Asthma [J45.909] 09/08/2006  . ECZEMA, ATOPIC DERMATITIS [L20.89] 09/08/2006   Total Time spent with patient: 25 minutes  Past Psychiatric History: Patient has a past hx of bipolar disorder , PTSD , and borderline personality disorder    Past Medical History:  Past Medical History  Diagnosis Date  . Asthma   . Seasonal allergies   . Depression   . PTSD (post-traumatic stress disorder)   . Thyroid disease 2009    Graves disease (pt reported resolved); hypothyriodism  . Abnormal pap     pt reports abnl pap many years ago.  Nl since then.  . Palpitations 03/12/2008  . Bipolar disorder [facility])     Past Surgical History  Procedure Laterality Date  . Dilation and curettage of uterus  March 2006   Family History:  Family History  Problem Relation Age of Onset  . Drug abuse Father   . Depression Maternal Aunt   . Depression Maternal Grandmother   . Anxiety disorder Maternal Grandmother   . COPD Maternal Grandmother   . Suicidality Cousin   . Depression Cousin   . Bipolar disorder Cousin   . Depression Maternal Aunt   . Hypertension Mother   . Depression Mother   . Diabetes Paternal Grandfather   . COPD Paternal Grandmother   . Heart disease Neg Hx    Family Psychiatric  History: significant for unknown mental illness. Social History:  History  Alcohol Use No     History  Drug Use No    Comment: past use of marijuana in '08-'09. occasional eats brownies w/ marijuana  before thanksgiving    Social History   Social History  . Marital Status: Single    Spouse Name: N/A  . Number of Children: N/A  . Years of Education: N/A   Social History Main Topics  .  Smoking status: Never Smoker   . Smokeless tobacco: Never Used  . Alcohol Use: No  . Drug Use: No     Comment: past use of marijuana in '08-'09. occasional eats brownies w/ marijuana  before thanksgiving  . Sexual Activity: Not Currently    Birth Control/ Protection: Abstinence   Other Topics Concern  . 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   Additional Social History:    History of alcohol / drug use?: No history of alcohol / drug abuse     Sleep:  Fair  Appetite:  Poor improving  Current Medications: Current Facility-Administered Medications  Medication Dose Route Frequency Provider Last Rate Last Dose  . albuterol (PROVENTIL HFA;VENTOLIN HFA) 108 (90 Base) MCG/ACT inhaler 2 puff  2 puff Inhalation Q6H PRN Laverle Hobby, PA-C   2 puff at 01/08/16 1512  . alum & mag hydroxide-simeth (MAALOX/MYLANTA) 200-200-20 MG/5ML suspension 30 mL  30 mL Oral Q4H PRN Benjamine Mola, FNP      . docusate sodium (COLACE) capsule 100 mg  100 mg Oral BID Ursula Alert, MD   100 mg at 01/09/16 0804  . FLUoxetine (PROZAC) capsule 20 mg  20 mg Oral Daily Ursula Alert, MD   20 mg at 01/09/16 0804  . fluticasone (FLOVENT HFA) 44 MCG/ACT inhaler 2 puff  2 puff Inhalation BID Ursula Alert, MD   2 puff at 01/09/16 0805  . hydrOXYzine (ATARAX/VISTARIL) tablet 25 mg  25 mg Oral Q6H PRN Benjamine Mola, FNP   25 mg at 01/08/16 1855  . ibuprofen (ADVIL,MOTRIN) tablet 400 mg  400 mg Oral Q6H PRN Ursula Alert, MD   400 mg at 01/09/16 1126  . lamoTRIgine (LAMICTAL) tablet 25 mg  25 mg Oral BID Ambrose Finland, MD   25 mg at 01/09/16 0804  . levothyroxine (SYNTHROID, LEVOTHROID) tablet 112 mcg  112 mcg Oral QAC breakfast Benjamine Mola, FNP   112 mcg at 01/09/16 [phone]  . LORazepam (ATIVAN) tablet 1 mg  1 mg Oral Q6H PRN Ursula Alert, MD   1 mg at 01/08/16 1138   Or  . LORazepam (ATIVAN) injection 1 mg  1 mg Intramuscular Q6H PRN Deano Tomaszewski, MD      . magnesium hydroxide (MILK OF MAGNESIA) suspension 30 mL  30 mL Oral Daily PRN Benjamine Mola, FNP   30 mL at 01/04/16 1709  . nitrofurantoin (macrocrystal-monohydrate) (MACROBID) capsule 100 mg  100 mg Oral Q12H Kemari Mares, MD   100 mg at 01/09/16 0804  . OLANZapine zydis (ZYPREXA) disintegrating tablet 5 mg  5 mg Oral Q8H PRN Benjamine Mola, FNP   5 mg at 01/05/16 1204  . OXcarbazepine (TRILEPTAL) tablet 150 mg  150 mg Oral Daily Ursula Alert, MD   150 mg at 01/09/16 0804  . phenylephrine-shark  liver oil-mineral oil-petrolatum (PREPARATION H) rectal ointment   Rectal BID PRN Laverle Hobby, PA-C      . polyethylene glycol (MIRALAX / GLYCOLAX) packet 17 g  17 g Oral QHS Ursula Alert, MD   17 g at 01/08/16 2146  . QUEtiapine (SEROQUEL) tablet 250 mg  250 mg Oral QHS Madyn Ivins, MD      . QUEtiapine (SEROQUEL) tablet 50 mg  50 mg Oral TID PRN Ursula Alert, MD   50 mg at 01/04/16 1230  . traZODone (DESYREL) tablet 25 mg  25 mg Oral QHS PRN  Ursula Alert, MD        Lab Results:  No results found for this or any previous visit (from the past 48 hour(s)).  Blood Alcohol level:  Lab Results  Component Value Date   ETH <5 11/25/2015   ETH <5 Q000111Q    Metabolic Disorder Labs: Lab Results  Component Value Date   HGBA1C 6.1* 08/21/2015   MPG 128 08/21/2015   Lab Results  Component Value Date   PROLACTIN 8.8 10/05/2015   PROLACTIN 27.1* 08/21/2015   Lab Results  Component Value Date   CHOL 150 10/05/2015   TRIG 192* 10/05/2015   HDL 32* 10/05/2015   CHOLHDL 4.7 10/05/2015   VLDL 38 10/05/2015   LDLCALC 80 10/05/2015   LDLCALC 122* 08/21/2015    Physical Findings: AIMS: Facial and Oral Movements Muscles of Facial Expression: None, normal Lips and Perioral Area: None, normal Jaw: None, normal Tongue: None, normal,Extremity Movements Upper (arms, wrists, hands, fingers): None, normal Lower (legs, knees, ankles, toes): None, normal, Trunk Movements Neck, shoulders, hips: None, normal, Overall Severity Severity of abnormal movements (highest score from questions above): None, normal Incapacitation due to abnormal movements: None, normal Patient's awareness of abnormal movements (rate only patient's report): No Awareness, Dental Status Current problems with teeth and/or dentures?: No Does patient usually wear dentures?: No  CIWA:  CIWA-Ar Total: 0 COWS:  COWS Total Score: 0  Musculoskeletal: Strength & Muscle Tone: decreased Gait & Station:  normal Patient leans: N/A  Psychiatric Specialty Exam: Physical Exam  Review of Systems  Psychiatric/Behavioral: Positive for depression. The patient is nervous/anxious.   All other systems reviewed and are negative.   Blood pressure 108/71, pulse 90, temperature 98.1 F (36.7 C), temperature source Oral, resp. rate 16, height 5' 6.5" (1.689 m), weight 102.513 kg (226 lb), last menstrual period 12/23/2015, SpO2 98 %.Body mass index is 35.94 kg/(m^2).  General Appearance: Guarded  Eye Contact:  Fair  Speech:  Slow  Volume:  Decreased  Mood:  Anxious and Depressed   Affect:  Depressed and Tearful on and off   Thought Process:  Coherent and Descriptions of Associations: Circumstantial  Orientation:  Full (Time, Place, and Person)  Thought Content:  Delusions, Paranoid Ideation and Rumination increased today   Suicidal Thoughts:  No but is in distress and feels people are trying to kill her and she will die  Homicidal Thoughts:  No  Memory:  Immediate;   Fair Recent;   Fair Remote;   Fair  Judgement:  Fair  Insight:  Fair  Psychomotor Activity:  Restlessness  Concentration:  Concentration: Fair and Attention Span: Fair  Recall:  [employer] of Knowledge:  Good  Language:  Good  Akathisia:  Negative  Handed:  Right  AIMS (if indicated):     Assets:  Communication Skills Desire for Improvement Financial Resources/Insurance Housing Leisure Time Resilience Social Support Transportation  ADL's:  Intact  Cognition:  WNL  Sleep:  Number of Hours: 6.75     Treatment Plan Summary: Daily contact with patient to assess and evaluate symptoms and progress in treatment and Medication management   Plan; Colleen Peck is a 35 y.o., single AA female presented with worsening anxiety/depression and paranoia.Pt continues to be delusional and tearful  and needs inpatient stay.  Will increase Seroquel to 250g po qhs for psychosis/sleep. Pt is very sensitive to medications and hence dose  can be titrated up very slowly. Will increase Lamictal to 75 mg po daily  for mood sx  in divided doses .Will titrate higher based on response.  Will taper down Trileptal to 150 mg po bid for mood sx. Prozac 20 mg po daily for PTSD sx. Trazodone 50 mg po qhs prn for sleep, now that she is on Seroquel. Will make available prn medications as per agitation protocol. Continue to monitor vitals, medication compliance and treatment side effects  Will monitor for medical issues as well as call consult as needed.  Will continue Macrobid 100 mg po bid for UTI . CSW will continue working on disposition.  Patient to participate in therapeutic milieu.    Colleen Olivencia, MD 01/09/2016, 2:28 PM

## 2016-01-09 NOTE — Progress Notes (Signed)
Recreation Therapy Notes   06.30.2017 approximately 9:45am. LRT with resources for patient on community yoga programs and a [employer] application. Patient receptive to resources, but sleeping when LRT arrived to room, so she requested LRT leave them for her review at a later time. LRT honored patient request.   Lane Hacker, LRT/CTRS         Lane Hacker 01/09/2016 3:37 PM

## 2016-01-09 NOTE — Progress Notes (Signed)
DAR NOTE: Patient presents with flat affect and depressed mood.  Denies  auditory and visual hallucinations.  Rates depression at 5, hopelessness at 5, and anxiety at 5.  Described energy level as normal and concentration as good.  Maintained on routine safety checks.  Medications given as prescribed.  Support and encouragement offered as needed.  Attended group and participated.  States goal for today is .  Minimal interaction with staff and peers.  Patient requested and received Vistaril for anxiety with good effect.  Motrin 400 mg given for complain of hemorrhoid.

## 2016-01-09 NOTE — Progress Notes (Signed)
Patient ID: Colleen Peck, female   DOB: 08-25-1980, 35 y.o.   MRN: [id number] D: "I'm doing better, I have not cried today".Patient in [place] another patient's hair on approach. Mood and affect appeared depressed and sad. Pt reports she is tolerating medication well. Pt denies SI/HI/AVH and pain. Pt reports plans to get out of her comfort zone and "live" after discharge. Pt attended and participated in evening wrap up group. Cooperative with assessment.  A: Medications administered as prescribed. Support and encouragement provided as needed. Pt encouraged to discuss feelings and come to staff with any question or concerns.  R: Patient remains safe and complaint with medications.

## 2016-01-09 NOTE — [facility] Group Notes (Signed)
[facility] LCSW Group Therapy  01/09/2016  1:05 PM  Type of Therapy:  Group therapy  Participation Level:  Active  Participation Quality:  Attentive  Affect:  Flat  Cognitive:  Oriented  Insight:  Limited  Engagement in Therapy:  Limited  Modes of Intervention:  Discussion, Socialization  Summary of Progress/Problems:  Chaplain was here to lead a group on themes of hope and courage.   I've been thinking about hope and faith lately.  But I really don't know what I am hopeful anymore.  I was hopeful that I would graduate from grad school and make a difference, but i don't know any more.  But now, I guess I just want to get me better, to not be scared all the time, and to feel better.  I pray that I make good decisions going forward.  I feel like I have made poor decisions this past year."  Went on to talk about how her goals have changed for herself since her diagnosis, and how that has been a challenging adjustment.   Roque Lias B 01/09/2016 1:41 PM

## 2016-01-10 NOTE — Progress Notes (Signed)
[facility] MD Progress Note  01/10/2016 12:26 PM Colleen Peck  MRN:  [id number]   Subjective:  Patient stated that "I am tired."        Objective: Patient seen, chart reviewed and case discussed with the treatment team. Patient has been suffering with increased symptoms of depression, anxiety, crying's spells, disturbed sleep and appetite, isolated and withdrawn which resulted in this admission.   Patient today is seen as depressed , continues to have some crying spells on and off . Pt continues to be paranoid and delusional - is worried about her safety -talks on and off about gang members as well as others out to get her. Pt today reports side effects of feeling tired on the seroquel. Discussed that her dose will stay the same today, will not be further increased. Pt continues to need a lot of redirection and support and per staff appears withdrawn and sad often.    Principal Problem: Schizoaffective disorder, bipolar type ([place]) Diagnosis:   Patient Active Problem List   Diagnosis Date Noted  . History of borderline personality disorder [Z86.59] 01/01/2016  . Carotidynia [G90.01] 12/16/2015  . Schizoaffective disorder, bipolar type ([place]) [F25.0] 11/26/2015  . Anxiety [F41.9]   . Pain of left calf [M79.662] 11/10/2015  . PTSD (post-traumatic stress disorder) [F43.10] 09/30/2015  . Medical clearance for psychiatric admission [Z00.8]   . Paranoia ([place]) [F22]   . Anemia [D64.9] 01/30/2015  . Unspecified vitamin D deficiency [E55.9] 05/22/2013  . Hypothyroidism (acquired) [E03.9] 11/29/2011  . Obesity [E66.9] 09/08/2006  . RHINITIS, ALLERGIC [J30.9] 09/08/2006  . Asthma [J45.909] 09/08/2006  . ECZEMA, ATOPIC DERMATITIS [L20.89] 09/08/2006   Total Time spent with patient: 25 minutes  Past Psychiatric History: Patient has a past hx of bipolar disorder , PTSD , and borderline personality disorder   Past Medical History:  Past Medical History  Diagnosis Date  . Asthma   . Seasonal  allergies   . Depression   . PTSD (post-traumatic stress disorder)   . Thyroid disease 2009    Graves disease (pt reported resolved); hypothyriodism  . Abnormal pap     pt reports abnl pap many years ago.  Nl since then.  . Palpitations 03/12/2008  . Bipolar disorder [facility])     Past Surgical History  Procedure Laterality Date  . Dilation and curettage of uterus  March 2006   Family History:  Family History  Problem Relation Age of Onset  . Drug abuse Father   . Depression Maternal Aunt   . Depression Maternal Grandmother   . Anxiety disorder Maternal Grandmother   . COPD Maternal Grandmother   . Suicidality Cousin   . Depression Cousin   . Bipolar disorder Cousin   . Depression Maternal Aunt   . Hypertension Mother   . Depression Mother   . Diabetes Paternal Grandfather   . COPD Paternal Grandmother   . Heart disease Neg Hx    Family Psychiatric  History: significant for unknown mental illness. Social History:  History  Alcohol Use No     History  Drug Use No    Comment: past use of marijuana in '08-'09. occasional eats brownies w/ marijuana  before thanksgiving    Social History   Social History  . Marital Status: Single    Spouse Name: N/A  . Number of Children: N/A  . Years of Education: N/A   Social History Main Topics  . Smoking status: Never Smoker   . Smokeless tobacco: Never Used  . Alcohol Use:  No  . Drug Use: No     Comment: past use of marijuana in '08-'09. occasional eats brownies w/ marijuana  before thanksgiving  . Sexual Activity: Not Currently    Birth Control/ Protection: Abstinence   Other Topics Concern  . 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   Additional Social History:    History of alcohol / drug use?: No history of alcohol / drug abuse     Sleep: Fair  Appetite:  Poor improving  Current Medications: Current Facility-Administered  Medications  Medication Dose Route Frequency Provider Last Rate Last Dose  . albuterol (PROVENTIL HFA;VENTOLIN HFA) 108 (90 Base) MCG/ACT inhaler 2 puff  2 puff Inhalation Q6H PRN Laverle Hobby, PA-C   2 puff at 01/08/16 1512  . alum & mag hydroxide-simeth (MAALOX/MYLANTA) 200-200-20 MG/5ML suspension 30 mL  30 mL Oral Q4H PRN Benjamine Mola, FNP      . docusate sodium (COLACE) capsule 100 mg  100 mg Oral BID Ursula Alert, MD   100 mg at 01/10/16 0814  . FLUoxetine (PROZAC) capsule 20 mg  20 mg Oral Daily Ursula Alert, MD   20 mg at 01/10/16 0814  . fluticasone (FLOVENT HFA) 44 MCG/ACT inhaler 2 puff  2 puff Inhalation BID Ursula Alert, MD   2 puff at 01/10/16 0816  . hydrOXYzine (ATARAX/VISTARIL) tablet 25 mg  25 mg Oral Q6H PRN Benjamine Mola, FNP   25 mg at 01/09/16 1656  . ibuprofen (ADVIL,MOTRIN) tablet 400 mg  400 mg Oral Q6H PRN Ursula Alert, MD   400 mg at 01/09/16 1656  . lamoTRIgine (LAMICTAL) tablet 25 mg  25 mg Oral Daily Ursula Alert, MD   25 mg at 01/10/16 0814  . lamoTRIgine (LAMICTAL) tablet 50 mg  50 mg Oral QPM Ever Gustafson, MD   50 mg at 01/09/16 1658  . levothyroxine (SYNTHROID, LEVOTHROID) tablet 224 mcg  224 mcg Oral QAC breakfast Ursula Alert, MD   224 mcg at 01/10/16 0631  . LORazepam (ATIVAN) tablet 1 mg  1 mg Oral Q6H PRN Ursula Alert, MD   1 mg at 01/09/16 1821   Or  . LORazepam (ATIVAN) injection 1 mg  1 mg Intramuscular Q6H PRN Zeina Akkerman, MD      . magnesium hydroxide (MILK OF MAGNESIA) suspension 30 mL  30 mL Oral Daily PRN Benjamine Mola, FNP   30 mL at 01/04/16 1709  . nitrofurantoin (macrocrystal-monohydrate) (MACROBID) capsule 100 mg  100 mg Oral Q12H Haskel Dewalt, MD   100 mg at 01/10/16 0814  . OLANZapine zydis (ZYPREXA) disintegrating tablet 5 mg  5 mg Oral Q8H PRN Benjamine Mola, FNP   5 mg at 01/05/16 1204  . phenylephrine-shark liver oil-mineral oil-petrolatum (PREPARATION H) rectal ointment   Rectal BID PRN Laverle Hobby, PA-C       . polyethylene glycol (MIRALAX / GLYCOLAX) packet 17 g  17 g Oral QHS Ursula Alert, MD   17 g at 01/09/16 2155  . QUEtiapine (SEROQUEL) tablet 250 mg  250 mg Oral QHS Ursula Alert, MD   250 mg at 01/09/16 2154  . QUEtiapine (SEROQUEL) tablet 50 mg  50 mg Oral TID PRN Ursula Alert, MD   50 mg at 01/04/16 1230  . traZODone (DESYREL) tablet 25 mg  25 mg Oral QHS PRN Ursula Alert, MD        Lab Results:  No results found for this or any previous visit (from the past 48 hour(s)).  Blood Alcohol level:  Lab Results  Component Value Date   ETH <5 11/25/2015   ETH <5 Q000111Q    Metabolic Disorder Labs: Lab Results  Component Value Date   HGBA1C 6.1* 08/21/2015   MPG 128 08/21/2015   Lab Results  Component Value Date   PROLACTIN 8.8 10/05/2015   PROLACTIN 27.1* 08/21/2015   Lab Results  Component Value Date   CHOL 150 10/05/2015   TRIG 192* 10/05/2015   HDL 32* 10/05/2015   CHOLHDL 4.7 10/05/2015   VLDL 38 10/05/2015   LDLCALC 80 10/05/2015   LDLCALC 122* 08/21/2015    Physical Findings: AIMS: Facial and Oral Movements Muscles of Facial Expression: None, normal Lips and Perioral Area: None, normal Jaw: None, normal Tongue: None, normal,Extremity Movements Upper (arms, wrists, hands, fingers): None, normal Lower (legs, knees, ankles, toes): None, normal, Trunk Movements Neck, shoulders, hips: None, normal, Overall Severity Severity of abnormal movements (highest score from questions above): None, normal Incapacitation due to abnormal movements: None, normal Patient's awareness of abnormal movements (rate only patient's report): No Awareness, Dental Status Current problems with teeth and/or dentures?: No Does patient usually wear dentures?: No  CIWA:  CIWA-Ar Total: 0 COWS:  COWS Total Score: 0  Musculoskeletal: Strength & Muscle Tone: decreased Gait & Station: normal Patient leans: N/A  Psychiatric Specialty Exam: Physical Exam  Review of Systems   Psychiatric/Behavioral: Positive for depression. The patient is nervous/anxious.   All other systems reviewed and are negative.   Blood pressure 95/66, pulse 77, temperature 98.3 F (36.8 C), temperature source Oral, resp. rate 20, height 5' 6.5" (1.689 m), weight 102.513 kg (226 lb), last menstrual period 12/23/2015, SpO2 98 %.Body mass index is 35.94 kg/(m^2).  General Appearance: Guarded  Eye Contact:  Fair  Speech:  Slow  Volume:  Decreased  Mood:  Anxious and Depressed   Affect:  Depressed and Tearful on and off   Thought Process:  Coherent and Descriptions of Associations: Circumstantial  Orientation:  Full (Time, Place, and Person)  Thought Content:  Delusions, Paranoid Ideation and Rumination increased today   Suicidal Thoughts:  No but is in distress and feels people are trying to kill her and she will die  Homicidal Thoughts:  No  Memory:  Immediate;   Fair Recent;   Fair Remote;   Fair  Judgement:  Fair  Insight:  Fair  Psychomotor Activity:  Restlessness  Concentration:  Concentration: Fair and Attention Span: Fair  Recall:  [employer] of Knowledge:  Good  Language:  Good  Akathisia:  Negative  Handed:  Right  AIMS (if indicated):     Assets:  Communication Skills Desire for Improvement Financial Resources/Insurance Housing Leisure Time Resilience Social Support Transportation  ADL's:  Intact  Cognition:  WNL  Sleep:  Number of Hours: 6.75     Treatment Plan Summary: Daily contact with patient to assess and evaluate symptoms and progress in treatment and Medication management   Plan; Colleen Peck is a 35 y.o., single AA female presented with worsening anxiety/depression and paranoia.Pt continues to be delusional and tearful  and needs inpatient stay.  Will continue Seroquel 250 mg po qhs for psychosis/sleep. Pt is very sensitive to medications and hence dose can be titrated up very slowly. Increased Lamictal to 75 mg po daily  for mood sx in divided  doses .Will titrate higher based on response.  Will discontinue Trileptal 150  mg today. Continue Prozac 20 mg po daily for PTSD sx. Continue Trazodone 50 mg po qhs prn for sleep, now that she is on Seroquel. Will make available prn medications as per agitation protocol. Continue to monitor vitals, medication compliance and treatment side effects  Will monitor for medical issues as well as call consult as needed.  Will continue Macrobid 100 mg po bid for UTI . CSW will continue working on disposition.  Patient to participate in therapeutic milieu.    Chayanne Speir, MD 01/10/2016, 12:26 PM

## 2016-01-10 NOTE — [facility] Group Notes (Signed)
[place] LCSW Group Therapy  01/10/2016  11:20 - 11:55 AM  Type of Therapy:  Group Therapy  Participation Level:  Minimal  Participation Quality:  Attentive  Affect:  Appropriate  Cognitive:  Lacking  Insight:  Lacking  Engagement in Therapy:  Lacking  Modes of Intervention:  Discussion, Exploration, Rapport Building, Socialization and Support  Summary of Progress/Problems:   Summary of Progress/Problems: The main focus of today's process group was for the patient to identify ways in which they have in the past sabotaged their own recovery. Motivational Interviewing was then utilized to ask the group  how they can make changes toward self care. Patient was not fully as engaged as she appeared as evidenced by the fact that she was unable to answer multiple simple direct questions such as 'What are you looking forward to doing this summer?' Patient did identify with isolatiomn and is willing to work with a therapist for accountabiolity upon discharge.   Lyla Glassing

## 2016-01-10 NOTE — Progress Notes (Signed)
Colleen Peck remains guarded, paranoid and uneasy. She is seen on the 500 hall today...tolerated poor. A SHe remains in denial about the need for medications. She completes  Her daily assessment and on it she writes " 0/0/0/"for her depression, anxiety and hopelessness. R Safety in place.

## 2016-01-10 NOTE — Progress Notes (Signed)
D.  Pt in bed on approach, remains guarded but pleasant.  Pt did not attend evening wrap up group.  Pt denies SI/HI/hallucinations at this time.  Pt did take medication appropriately.  A.  Support and encouragement offered  R.  Pt remains safe on the unit, will continue to monitor.

## 2016-01-10 NOTE — [facility] Group Notes (Signed)
[place] Group Notes:  (Nursing/MHT/Case Management/Adjunct)  Date:  01/10/2016  Time:  11:05 AM  Type of Therapy:  Nurse Education  Participation Level:  Did Not Attend    Mart Piggs 01/10/2016, 11:05 AM

## 2016-01-11 MED ORDER — QUETIAPINE FUMARATE 50 MG PO TABS
250.0000 mg | ORAL_TABLET | Freq: Every day | ORAL | Status: DC
  Administered 2016-01-11 – 2016-01-12 (×2): 250 mg via ORAL
  Filled 2016-01-11 (×4): qty 1

## 2016-01-11 NOTE — [facility] Group Notes (Signed)
[place] LCSW Group Therapy  01/11/2016 11:15 AM  Type of Therapy:  Group Therapy  Participation Level:  Minimal  Participation Quality:  Drowsy  Affect:  Flat  Cognitive:  Oriented  Insight:  Limited  Engagement in Therapy:  Limited  Modes of Intervention:  Clarification, Exploration, Rapport Building, Socialization and Support  Summary of Progress/Problems: The main focus of today's process group was to identify the patient's current support system and decide on other supports that can be put in place. An emphasis was placed on using counselor, doctor, therapy groups, 12-step groups, and problem-specific support groups to expand supports. Patient reported that her greatest challenge at discharge will be avoiding isolation. Patient was open to considering the possibility of trying group support at [facility].  Sheilah Pigeon, LCSW

## 2016-01-11 NOTE — Progress Notes (Signed)
Adult Psychoeducational Group Note  Date:  01/11/2016 Time:  8:15 PM  Group Topic/Focus:  Wrap-Up Group:   The focus of this group is to help patients review their daily goal of treatment and discuss progress on daily workbooks.  Participation Level:  Active  Participation Quality:  Appropriate  Affect:  Appropriate  Cognitive:  Appropriate  Insight: Appropriate  Engagement in Group:  Engaged  Modes of Intervention:  Discussion  Additional Comments:  Pt rated her overall day a 7 out of 10 and stated that her day had "ups and downs". Pt reported that she achieved her goal for the day, which was to work on her discharge plans, get some rest, and work on her "cure".   [name] 01/11/2016, 8:21 PM

## 2016-01-11 NOTE — Progress Notes (Signed)
Patient ID: Colleen Peck, female   DOB: 1981-02-04, 35 y.o.   MRN: [id number]   D: Pt has been appropriate on the unit today. Pt attended all groups and engaged in treatment. Pt reported that she was ready for discharge and that she was ready to get back to her life. Pt took all medications as prescribed by the doctor. Pt reported that her depression was a 0, her hopelessness was a 0, and her anxiety was a 0. Pt reported that her goal for today was to work on self care. Pt reported being negative SI/HI, no AH/VH noted. A: 15 min checks continued for patient safety. R: Pt safety maintained.

## 2016-01-11 NOTE — Progress Notes (Addendum)
[facility] MD Progress Note  01/11/2016 10:18 AM Arshiya Penman  MRN:  [id number]   Subjective:  Patient stated that "I still have these thoughts, I am not sure why I have them.'         Objective: Patient seen, chart reviewed and case discussed with the treatment team. Patient has been suffering with increased symptoms of depression, anxiety, crying's spells, disturbed sleep and appetite, isolated and withdrawn which resulted in this admission.   Patient today is seen in bed, appears sad, tearful on and off , but is able to control her emotions. Pt denies any AH - but continues to have paranoid delusions that she may be followed or could be attacked by some one and could die the patient reports her thoughts are improving -but she does have it on and off. Pt today reports some racing heart rate last night , unknown if due to seroquel. Pt advised to keep an eye on her sx. Pt continues to need a lot of encouragement and support.    Principal Problem: Schizoaffective disorder, bipolar type ([place]) Diagnosis:   Patient Active Problem List   Diagnosis Date Noted  . History of borderline personality disorder [Z86.59] 01/01/2016  . Carotidynia [G90.01] 12/16/2015  . Schizoaffective disorder, bipolar type ([place]) [F25.0] 11/26/2015  . Anxiety [F41.9]   . Pain of left calf [M79.662] 11/10/2015  . PTSD (post-traumatic stress disorder) [F43.10] 09/30/2015  . Medical clearance for psychiatric admission [Z00.8]   . Paranoia ([place]) [F22]   . Anemia [D64.9] 01/30/2015  . Unspecified vitamin D deficiency [E55.9] 05/22/2013  . Hypothyroidism (acquired) [E03.9] 11/29/2011  . Obesity [E66.9] 09/08/2006  . RHINITIS, ALLERGIC [J30.9] 09/08/2006  . Asthma [J45.909] 09/08/2006  . ECZEMA, ATOPIC DERMATITIS [L20.89] 09/08/2006   Total Time spent with patient: 25 minutes  Past Psychiatric History: Patient has a past hx of bipolar disorder , PTSD , and borderline personality disorder   Past Medical History:   Past Medical History  Diagnosis Date  . Asthma   . Seasonal allergies   . Depression   . PTSD (post-traumatic stress disorder)   . Thyroid disease 2009    Graves disease (pt reported resolved); hypothyriodism  . Abnormal pap     pt reports abnl pap many years ago.  Nl since then.  . Palpitations 03/12/2008  . Bipolar disorder [facility])     Past Surgical History  Procedure Laterality Date  . Dilation and curettage of uterus  March 2006   Family History:  Family History  Problem Relation Age of Onset  . Drug abuse Father   . Depression Maternal Aunt   . Depression Maternal Grandmother   . Anxiety disorder Maternal Grandmother   . COPD Maternal Grandmother   . Suicidality Cousin   . Depression Cousin   . Bipolar disorder Cousin   . Depression Maternal Aunt   . Hypertension Mother   . Depression Mother   . Diabetes Paternal Grandfather   . COPD Paternal Grandmother   . Heart disease Neg Hx    Family Psychiatric  History: significant for unknown mental illness. Social History:  History  Alcohol Use No     History  Drug Use No    Comment: past use of marijuana in '08-'09. occasional eats brownies w/ marijuana  before thanksgiving    Social History   Social History  . Marital Status: Single    Spouse Name: N/A  . Number of Children: N/A  . Years of Education: N/A   Social History Main  Topics  . Smoking status: Never Smoker   . Smokeless tobacco: Never Used  . Alcohol Use: No  . Drug Use: No     Comment: past use of marijuana in '08-'09. occasional eats brownies w/ marijuana  before thanksgiving  . Sexual Activity: Not Currently    Birth Control/ Protection: Abstinence   Other Topics Concern  . 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   Additional Social History:    History of alcohol / drug use?: No history of alcohol / drug abuse     Sleep: Fair  Appetite:  Fair  improving  Current Medications: Current Facility-Administered Medications  Medication Dose Route Frequency Provider Last Rate Last Dose  . albuterol (PROVENTIL HFA;VENTOLIN HFA) 108 (90 Base) MCG/ACT inhaler 2 puff  2 puff Inhalation Q6H PRN Laverle Hobby, PA-C   2 puff at 01/08/16 1512  . alum & mag hydroxide-simeth (MAALOX/MYLANTA) 200-200-20 MG/5ML suspension 30 mL  30 mL Oral Q4H PRN Benjamine Mola, FNP      . docusate sodium (COLACE) capsule 100 mg  100 mg Oral BID Ursula Alert, MD   100 mg at 01/11/16 0756  . FLUoxetine (PROZAC) capsule 20 mg  20 mg Oral Daily Ursula Alert, MD   20 mg at 01/11/16 0756  . fluticasone (FLOVENT HFA) 44 MCG/ACT inhaler 2 puff  2 puff Inhalation BID Ursula Alert, MD   2 puff at 01/11/16 0756  . hydrOXYzine (ATARAX/VISTARIL) tablet 25 mg  25 mg Oral Q6H PRN Benjamine Mola, FNP   25 mg at 01/11/16 0940  . ibuprofen (ADVIL,MOTRIN) tablet 400 mg  400 mg Oral Q6H PRN Ursula Alert, MD   400 mg at 01/10/16 1413  . lamoTRIgine (LAMICTAL) tablet 25 mg  25 mg Oral Daily Ursula Alert, MD   25 mg at 01/11/16 0756  . lamoTRIgine (LAMICTAL) tablet 50 mg  50 mg Oral QPM Cathrine Krizan, MD   50 mg at 01/10/16 1715  . levothyroxine (SYNTHROID, LEVOTHROID) tablet 224 mcg  224 mcg Oral QAC breakfast Ursula Alert, MD   224 mcg at 01/11/16 0607  . LORazepam (ATIVAN) tablet 1 mg  1 mg Oral Q6H PRN Ursula Alert, MD   1 mg at 01/09/16 1821   Or  . LORazepam (ATIVAN) injection 1 mg  1 mg Intramuscular Q6H PRN Kason Benak, MD      . magnesium hydroxide (MILK OF MAGNESIA) suspension 30 mL  30 mL Oral Daily PRN Benjamine Mola, FNP   30 mL at 01/04/16 1709  . OLANZapine zydis (ZYPREXA) disintegrating tablet 5 mg  5 mg Oral Q8H PRN Benjamine Mola, FNP   5 mg at 01/05/16 1204  . phenylephrine-shark liver oil-mineral oil-petrolatum (PREPARATION H) rectal ointment   Rectal BID PRN Laverle Hobby, PA-C      . polyethylene glycol (MIRALAX / GLYCOLAX) packet 17 g  17 g Oral  QHS Ursula Alert, MD   17 g at 01/10/16 2106  . QUEtiapine (SEROQUEL) tablet 250 mg  250 mg Oral QHS Ursula Alert, MD   250 mg at 01/10/16 2105  . QUEtiapine (SEROQUEL) tablet 50 mg  50 mg Oral TID PRN Ursula Alert, MD   50 mg at 01/04/16 1230  . traZODone (DESYREL) tablet 25 mg  25 mg Oral QHS PRN Ursula Alert, MD        Lab Results:  No results found for this  or any previous visit (from the past 48 hour(s)).  Blood Alcohol level:  Lab Results  Component Value Date   ETH <5 11/25/2015   ETH <5 [id number]    Metabolic Disorder Labs: Lab Results  Component Value Date   HGBA1C 6.1* 08/21/2015   MPG 128 08/21/2015   Lab Results  Component Value Date   PROLACTIN 8.8 10/05/2015   PROLACTIN 27.1* 08/21/2015   Lab Results  Component Value Date   CHOL 150 10/05/2015   TRIG 192* 10/05/2015   HDL 32* 10/05/2015   CHOLHDL 4.7 10/05/2015   VLDL 38 10/05/2015   LDLCALC 80 10/05/2015   LDLCALC 122* 08/21/2015    Physical Findings: AIMS: Facial and Oral Movements Muscles of Facial Expression: None, normal Lips and Perioral Area: None, normal Jaw: None, normal Tongue: None, normal,Extremity Movements Upper (arms, wrists, hands, fingers): None, normal Lower (legs, knees, ankles, toes): None, normal, Trunk Movements Neck, shoulders, hips: None, normal, Overall Severity Severity of abnormal movements (highest score from questions above): None, normal Incapacitation due to abnormal movements: None, normal Patient's awareness of abnormal movements (rate only patient's report): No Awareness, Dental Status Current problems with teeth and/or dentures?: No Does patient usually wear dentures?: No  CIWA:  CIWA-Ar Total: 0 COWS:  COWS Total Score: 0  Musculoskeletal: Strength & Muscle Tone: decreased Gait & Station: normal Patient leans: N/A  Psychiatric Specialty Exam: Physical Exam  Review of Systems  Psychiatric/Behavioral: Positive for depression. The patient is  nervous/anxious.   All other systems reviewed and are negative.   Blood pressure 90/56, pulse 105, temperature 98.5 F (36.9 C), temperature source Oral, resp. rate 18, height 5' 6.5" (1.689 m), weight 102.513 kg (226 lb), last menstrual period 12/23/2015, SpO2 98 %.Body mass index is 35.94 kg/(m^2).  General Appearance: Guarded  Eye Contact:  Fair  Speech:  Slow  Volume:  Decreased  Mood:  Anxious and Depressed   Affect:  Depressed and Tearful on and off   Thought Process:  Coherent and Descriptions of Associations: Circumstantial  Orientation:  Full (Time, Place, and Person)  Thought Content:  Delusions, Paranoid Ideation and Rumination increased today   Suicidal Thoughts:  No but is in distress and feels people are trying to kill her and she will die- on and off  Homicidal Thoughts:  No  Memory:  Immediate;   Fair Recent;   Fair Remote;   Fair  Judgement:  Fair  Insight:  Fair  Psychomotor Activity:  Restlessness  Concentration:  Concentration: Fair and Attention Span: Fair  Recall:  [employer] of Knowledge:  Good  Language:  Good  Akathisia:  Negative  Handed:  Right  AIMS (if indicated):     Assets:  Communication Skills Desire for Improvement Financial Resources/Insurance Housing Leisure Time Resilience Social Support Transportation  ADL's:  Intact  Cognition:  WNL  Sleep:  Number of Hours: 6.25     Treatment Plan Summary: Daily contact with patient to assess and evaluate symptoms and progress in treatment and Medication management   Plan; Nahlani Grigg is a 35 y.o., single AA female presented with worsening anxiety/depression and paranoia.Pt continues to be delusional and tearful  and needs inpatient stay.  Will continue Seroquel 250 mg po qhs for psychosis/sleep. Pt is very sensitive to medications and hence dose can be titrated up very slowly.Scheduled time of seroquel changed to 8:00 PM due to pt being groggy in the AM. Increased Lamictal to 75 mg po  daily  for mood sx in  divided doses .Will titrate higher based on response.  Continue Prozac 20 mg po daily for PTSD sx. Continue Trazodone 50 mg po qhs prn for sleep, now that she is on Seroquel. Will make available prn medications as per agitation protocol. Continue to monitor vitals, medication compliance and treatment side effects  Will monitor for medical issues as well as call consult as needed.  CSW will continue working on disposition.  Patient to participate in therapeutic milieu.    Caydance Kuehnle, MD 01/11/2016, 10:18 AM

## 2016-01-12 ENCOUNTER — Ambulatory Visit: Payer: BLUE CROSS/BLUE SHIELD | Admitting: Licensed Clinical Social Worker

## 2016-01-12 MED ORDER — MAGNESIUM CITRATE PO SOLN
1.0000 | Freq: Once | ORAL | Status: AC
  Administered 2016-01-12: 1 via ORAL

## 2016-01-12 NOTE — Progress Notes (Signed)
D.  Pt pleasant on approach, complaint of constipation.  Pt observed up and being more interactive than previous night.  Pt denies SI/HI/hallucinations at this time.  A.  Support and encouragement offered, medication given as ordered, MOM given for constipation.  Pt refused Miiralax because she had taken MOM earlier.  R.  Pt remains safe on the unit, will continue to monitor.

## 2016-01-12 NOTE — Progress Notes (Signed)
DAR NOTE: Patient presents with anxious affect and depressed mood.  Denies pain, auditory and visual hallucinations.  Rates depression at 0, hopelessness at 0, and anxiety at 3.  Maintained on routine safety checks.  Medications given as prescribed.  Support and encouragement offered as needed.  Attended group and participated.  States goal for today is "self care, group, discharge."  Patient observed socializing with peers in the dayroom.  Citrate magnesium given for constipation with good effect.   Patient complain of urinary retention and unable to void since yesterday.  Fluid intake encouraged and patient voided 300 ml urine output.  Bladder scan performed, 26 ml urine residual noted.  Patient reports feeling comfortable and relieved.

## 2016-01-12 NOTE — Progress Notes (Signed)
Adult Psychoeducational Group Note  Date:  01/12/2016 Time:  8:15 PM  Group Topic/Focus:  Wrap-Up Group:   The focus of this group is to help patients review their daily goal of treatment and discuss progress on daily workbooks.  Participation Level:  Did Not Attend  Pt did not attend wrap-up group. Pt declined invitation.    [name] 01/12/2016, 8:25 PM

## 2016-01-12 NOTE — Progress Notes (Signed)
[facility] MD Progress Note  01/12/2016 12:40 PM Sevasti Carper  MRN:  [id number]   Subjective:  Patient stated that maybe I can go today.  I still cant have bowel move."  Objective: Patient seen, chart reviewed and case discussed with the treatment team.  Patient seen in hallways.  Reactive affect.  She is pleasant and inquiring if she will go home. Pt denies any AH.  She denies SI. Tolerant of meds and no ADR's.  Principal Problem: Schizoaffective disorder, bipolar type ([place]) Diagnosis:   Patient Active Problem List   Diagnosis Date Noted  . History of borderline personality disorder [Z86.59] 01/01/2016  . Carotidynia [G90.01] 12/16/2015  . Schizoaffective disorder, bipolar type ([place]) [F25.0] 11/26/2015  . Anxiety [F41.9]   . Pain of left calf [M79.662] 11/10/2015  . PTSD (post-traumatic stress disorder) [F43.10] 09/30/2015  . Medical clearance for psychiatric admission [Z00.8]   . Paranoia ([place]) [F22]   . Anemia [D64.9] 01/30/2015  . Unspecified vitamin D deficiency [E55.9] 05/22/2013  . Hypothyroidism (acquired) [E03.9] 11/29/2011  . Obesity [E66.9] 09/08/2006  . RHINITIS, ALLERGIC [J30.9] 09/08/2006  . Asthma [J45.909] 09/08/2006  . ECZEMA, ATOPIC DERMATITIS [L20.89] 09/08/2006   Total Time spent with patient: 25 minutes  Past Psychiatric History: Patient has a past hx of bipolar disorder , PTSD , and borderline personality disorder   Past Medical History:  Past Medical History  Diagnosis Date  . Asthma   . Seasonal allergies   . Depression   . PTSD (post-traumatic stress disorder)   . Thyroid disease 2009    Graves disease (pt reported resolved); hypothyriodism  . Abnormal pap     pt reports abnl pap many years ago.  Nl since then.  . Palpitations 03/12/2008  . Bipolar disorder [facility])     Past Surgical History  Procedure Laterality Date  . Dilation and curettage of uterus  March 2006   Family History:  Family History  Problem Relation Age of Onset  . Drug abuse  Father   . Depression Maternal Aunt   . Depression Maternal Grandmother   . Anxiety disorder Maternal Grandmother   . COPD Maternal Grandmother   . Suicidality Cousin   . Depression Cousin   . Bipolar disorder Cousin   . Depression Maternal Aunt   . Hypertension Mother   . Depression Mother   . Diabetes Paternal Grandfather   . COPD Paternal Grandmother   . Heart disease Neg Hx    Family Psychiatric  History: significant for unknown mental illness. Social History:  History  Alcohol Use No     History  Drug Use No    Comment: past use of marijuana in '08-'09. occasional eats brownies w/ marijuana  before thanksgiving    Social History   Social History  . Marital Status: Single    Spouse Name: N/A  . Number of Children: N/A  . Years of Education: N/A   Social History Main Topics  . Smoking status: Never Smoker   . Smokeless tobacco: Never Used  . Alcohol Use: No  . Drug Use: No     Comment: past use of marijuana in '08-'09. occasional eats brownies w/ marijuana  before thanksgiving  . Sexual Activity: Not Currently    Birth Control/ Protection: Abstinence   Other Topics Concern  . 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   Additional Social History:    History of alcohol /  drug use?: No history of alcohol / drug abuse     Sleep: Fair  Appetite:  Fair improving  Current Medications: Current Facility-Administered Medications  Medication Dose Route Frequency Provider Last Rate Last Dose  . albuterol (PROVENTIL HFA;VENTOLIN HFA) 108 (90 Base) MCG/ACT inhaler 2 puff  2 puff Inhalation Q6H PRN Laverle Hobby, PA-C   2 puff at 01/08/16 1512  . alum & mag hydroxide-simeth (MAALOX/MYLANTA) 200-200-20 MG/5ML suspension 30 mL  30 mL Oral Q4H PRN Benjamine Mola, FNP      . docusate sodium (COLACE) capsule 100 mg  100 mg Oral BID Ursula Alert, MD   100 mg at 01/12/16 0756  . FLUoxetine  (PROZAC) capsule 20 mg  20 mg Oral Daily Ursula Alert, MD   20 mg at 01/12/16 0756  . fluticasone (FLOVENT HFA) 44 MCG/ACT inhaler 2 puff  2 puff Inhalation BID Ursula Alert, MD   2 puff at 01/12/16 0755  . hydrOXYzine (ATARAX/VISTARIL) tablet 25 mg  25 mg Oral Q6H PRN Benjamine Mola, FNP   25 mg at 01/11/16 0940  . ibuprofen (ADVIL,MOTRIN) tablet 400 mg  400 mg Oral Q6H PRN Ursula Alert, MD   400 mg at 01/10/16 1413  . lamoTRIgine (LAMICTAL) tablet 25 mg  25 mg Oral Daily Ursula Alert, MD   25 mg at 01/12/16 0756  . lamoTRIgine (LAMICTAL) tablet 50 mg  50 mg Oral QPM Saramma Eappen, MD   50 mg at 01/11/16 1614  . levothyroxine (SYNTHROID, LEVOTHROID) tablet 224 mcg  224 mcg Oral QAC breakfast Ursula Alert, MD   224 mcg at 01/12/16 0607  . LORazepam (ATIVAN) tablet 1 mg  1 mg Oral Q6H PRN Ursula Alert, MD   1 mg at 01/11/16 1802   Or  . LORazepam (ATIVAN) injection 1 mg  1 mg Intramuscular Q6H PRN Saramma Eappen, MD      . magnesium hydroxide (MILK OF MAGNESIA) suspension 30 mL  30 mL Oral Daily PRN Benjamine Mola, FNP   30 mL at 01/04/16 1709  . OLANZapine zydis (ZYPREXA) disintegrating tablet 5 mg  5 mg Oral Q8H PRN Benjamine Mola, FNP   5 mg at 01/05/16 1204  . phenylephrine-shark liver oil-mineral oil-petrolatum (PREPARATION H) rectal ointment   Rectal BID PRN Laverle Hobby, PA-C      . polyethylene glycol (MIRALAX / GLYCOLAX) packet 17 g  17 g Oral QHS Ursula Alert, MD   17 g at 01/10/16 2106  . QUEtiapine (SEROQUEL) tablet 250 mg  250 mg Oral Q2000 Ursula Alert, MD   250 mg at 01/11/16 2029  . QUEtiapine (SEROQUEL) tablet 50 mg  50 mg Oral TID PRN Ursula Alert, MD   50 mg at 01/04/16 1230  . traZODone (DESYREL) tablet 25 mg  25 mg Oral QHS PRN Ursula Alert, MD        Lab Results:  No results found for this or any previous visit (from the past 48 hour(s)).  Blood Alcohol level:  Lab Results  Component Value Date   [facility] <5 11/25/2015   ETH <5 [id number]     Metabolic Disorder Labs: Lab Results  Component Value Date   HGBA1C 6.1* 08/21/2015   MPG 128 08/21/2015   Lab Results  Component Value Date   PROLACTIN 8.8 10/05/2015   PROLACTIN 27.1* 08/21/2015   Lab Results  Component Value Date   CHOL 150 10/05/2015   TRIG 192* 10/05/2015   HDL 32* 10/05/2015   CHOLHDL 4.7 10/05/2015  VLDL 38 10/05/2015   LDLCALC 80 10/05/2015   LDLCALC 122* 08/21/2015    Physical Findings: AIMS: Facial and Oral Movements Muscles of Facial Expression: None, normal Lips and Perioral Area: None, normal Jaw: None, normal Tongue: None, normal,Extremity Movements Upper (arms, wrists, hands, fingers): None, normal Lower (legs, knees, ankles, toes): None, normal, Trunk Movements Neck, shoulders, hips: None, normal, Overall Severity Severity of abnormal movements (highest score from questions above): None, normal Incapacitation due to abnormal movements: None, normal Patient's awareness of abnormal movements (rate only patient's report): No Awareness, Dental Status Current problems with teeth and/or dentures?: No Does patient usually wear dentures?: No  CIWA:  CIWA-Ar Total: 0 COWS:  COWS Total Score: 0  Musculoskeletal: Strength & Muscle Tone: decreased Gait & Station: normal Patient leans: N/A  Psychiatric Specialty Exam: Physical Exam  Nursing note and vitals reviewed. Eyes: Left eye exhibits no discharge.  Psychiatric: Her mood appears anxious. Thought content is not paranoid. She exhibits a depressed mood. She expresses no homicidal and no suicidal ideation.    Review of Systems  Psychiatric/Behavioral: Negative for depression. The patient is nervous/anxious.   All other systems reviewed and are negative.   Blood pressure 90/56, pulse 105, temperature 98.5 F (36.9 C), temperature source Oral, resp. rate 18, height 5' 6.5" (1.689 m), weight 102.513 kg (226 lb), last menstrual period 12/23/2015, SpO2 98 %.Body mass index is 35.94  kg/(m^2).  General Appearance: Guarded  Eye Contact:  Fair  Speech:  Slow  Volume:  Decreased  Mood:  Anxious and Depressed   Affect:  Appropriate   Thought Process:  Coherent and Descriptions of Associations: Circumstantial  Orientation:  Full (Time, Place, and Person)  Thought Content:  Rumination increased today   Suicidal Thoughts:  No   Homicidal Thoughts:  No  Memory:  Immediate;   Fair Recent;   Fair Remote;   Fair  Judgement:  Fair  Insight:  Fair  Psychomotor Activity:  Restlessness  Concentration:  Concentration: Fair and Attention Span: Fair  Recall:  [employer] of Knowledge:  Good  Language:  Good  Akathisia:  Negative  Handed:  Right  AIMS (if indicated):     Assets:  Communication Skills Desire for Improvement Financial Resources/Insurance Housing Leisure Time Resilience Social Support Transportation  ADL's:  Intact  Cognition:  WNL  Sleep:  Number of Hours: 6.5   Treatment Plan Summary: Daily contact with patient to assess and evaluate symptoms and progress in treatment and Medication management   Plan; Kahlilah Epp is a 35 y.o., single AA female presented with worsening anxiety/depression and paranoia.Pt continues to be delusional and tearful  and needs inpatient stay.  continue Seroquel 250 mg po qhs for psychosis/sleep. Pt is very sensitive to medications and hence dose can be titrated up very slowly.  No change today.  Scheduled time of seroquel changed to 8:00 PM due to pt being groggy in the AM. Increased Lamictal to 75 mg po daily  for mood sx in divided doses.  Will titrate higher based on response.  Continue Prozac 20 mg po daily for PTSD sx. Continue Trazodone 50 mg po qhs prn for sleep, now that she is on Seroquel. Will make available prn medications as per agitation protocol. Continue to monitor vitals, medication compliance and treatment side effects  Will monitor for medical issues as well as call consult as needed.  CSW will continue  working on disposition.  Patient to participate in therapeutic milieu.   Janett Labella, NP  BC 01/12/2016, 12:40 PM Agree with NP progress note as above

## 2016-01-12 NOTE — Progress Notes (Signed)
Recreation Therapy Notes  Date: 07.03.2017 Time: 9:30am Location: 300 Hall Group Room   Group Topic: Stress Management  Goal Area(s) Addresses:  Patient will actively participate in stress management techniques presented during session.   Behavioral Response: Engaged, Attentive   Intervention: Stress management techniques  Activity :  Diaphragmatic Breathing and Guided Imagery. LRT provided education, instruction and demonstration on practice of Diaphragmatic Breathing and Guided Imagery. Patient was asked to participate in technique introduced during session.   Education:  Stress Management, Discharge Planning.   Education Outcome: Acknowledges education  Clinical Observations/Feedback: Patient actively engaged in technique introduced, expressed no concerns and demonstrated ability to practice independently post d/c.   Laureen Ochs Yoshiko Keleher, LRT/CTRS        Ceri Mayer L 01/12/2016 2:04 PM

## 2016-01-12 NOTE — Progress Notes (Signed)
Recreation Therapy Notes  07.03.2017 approximately 2:30pm. LRT returned to work on patient anxiety level. LRT provided worksheet that asks patient to identify irrational thoughts and various outcomes for her irrational thoughts. Patient completed worksheet without issue and was able to verbalize fears of leaving her home are irrational thoughts. Patient identified that the worst outcome is that she will die, LRT provided various scenarios - a stubbed toe, a broken ankle, patient able to conceptualize that if these things happened it would not matter to her in a month or a year. Patient then participated in visualization where she envisioned herself leaving her home and going to the store, patient able to participate in technique without issue. Patient able to describe her trip in detail and stated she felt no anxiety visualizing this experience.   Patient reports she anticipates d/c tomorrow and she will anticipates that she will need assistance leaving her home. Patient identified she would ask someone to go with her.   Laureen Ochs Tonny Isensee, LRT/CTRS         Zenobia Kuennen L 01/12/2016 4:09 PM

## 2016-01-12 NOTE — Tx Team (Signed)
Interdisciplinary Treatment Plan Update (Adult)  Date:  01/12/2016   Time Reviewed:  8:24 AM   Progress in Treatment: Attending groups: Yes. Participating in groups:  Yes. Taking medication as prescribed:  Yes. Tolerating medication:  Yes. Family/Significant other contact made: Yes Patient understands diagnosis:  Yes  As evidenced by seeking help with "feeling overwhelmed" Discussing patient identified problems/goals with staff:  Yes, see initial care plan. Medical problems stabilized or resolved:  Yes. Denies suicidal/homicidal ideation: Yes. Issues/concerns per patient self-inventory:  No. Other:  New problem(s) identified:  Discharge Plan or Barriers: see below  Reason for Continuation of Hospitalization: Medication stabilization Other; describe Paranoia  Comments:  Pt shared that she is dealing with a lot of stressor and is also endorsing multiple depressive symptoms. Pt reported that she has experienced some weight changes and her appetite is poor. Pt also shared that she spends a lot of time in the bed and she is hesitant about going outside due to believing that something bad will happen. Pt reported that she is compliant with her medication but shared that she continues to have crying spells and stated "I have a deep cry". Pt shared that her therapist is in the process of referring her to the [place]. Will discontinue Risperidone for side effects. Will start a trial of Seroquel 25 mg po qhs for psychosis/sleep. Will start Lamictal 25 mg po daily for mood sx. Will titrate higher based on response. Discussed side effects including a rash. Will taper down Trileptal to 150 mg po bid for mood sx. Will continue Prozac 20 mg po daily for PTSD sx. Will make Trazodone 100 mg po qhs prn for sleep, now that she is on Seroquel  01/07/16:  Will increase Seroquel to 150 mg po qhs for psychosis/sleep. Will continue Lamictal 25 mg po bid for mood sx. Will titrate higher based  on response.  Will taper down Trileptal to 150 mg po bid for mood sx. Prozac 20 mg po daily for PTSD sx. Trazodone 50 mg po qhs prn for sleep, now that she is on Seroquel.    Estimated length of stay: Likely d/c tomorrow  New goal(s):  Review of initial/current patient goals per problem list:   Review of initial/current patient goals per problem list:  1. Goal(s): Patient will participate in aftercare plan   Met: Yes   Target date: 3-5 days post admission date   As evidenced by: Patient will participate within aftercare plan AEB aftercare provider and housing plan at discharge being identified. 01/02/16: Return home, follow up outpt   2. Goal (s): Patient will exhibit decreased depressive symptoms and suicidal ideations.   Met: Yes   Target date: 3-5 days post admission date   As evidenced by: Patient will utilize self rating of depression at 3 or below and demonstrate decreased signs of depression or be deemed stable for discharge by MD. 01/02/16:  Rates her depression a 10 today 01/07/16:  Rates depression a 3 today    3. Goal(s): Patient will demonstrate decreased signs and symptoms of anxiety.   Met: Yes   Target date: 3-5 days post admission date   As evidenced by: Patient will utilize self rating of anxiety at 3 or below and demonstrated decreased signs of anxiety, or be deemed stable for discharge by MD 01/02/16: Pt rates her anxiety a 10 today 01/07/16:  Denies anxiety today.         5. Goal(s): Patient will demonstrate decreased signs of psychosis  *  Met: Yes  * Target date: 3-5 days post admission date  * As evidenced by: Patient will demonstrate decreased frequency of AVH or return to baseline function 01/02/16:  C/O racing thoughts , paranoia 01/07/16:  Continues to exhibit some paranoia, states she would feel unsafe if she left here now 01/12/16: Is stating that she feels safe enough to leave today         Attendees: Patient:   01/12/2016 8:24 AM   Family:   01/12/2016 8:24 AM   Physician:  Ursula Alert, MD 01/12/2016 8:24 AM   Nursing:   Hedy Jacob, RN 01/12/2016 8:24 AM   CSW:    Roque Lias, LCSW   01/12/2016 8:24 AM   Other:  01/12/2016 8:24 AM   Other:   01/12/2016 8:24 AM   Other:  Lars Pinks, Nurse CM 01/12/2016 8:24 AM   Other:   01/12/2016 8:24 AM   Other:  Norberto Sorenson, [place]  01/12/2016 8:24 AM   Other:  01/12/2016 8:24 AM   Other:  01/12/2016 8:24 AM   Other:  01/12/2016 8:24 AM   Other:  01/12/2016 8:24 AM   Other:  01/12/2016 8:24 AM   Other:   01/12/2016 8:24 AM    Scribe for Treatment Team:   Trish Mage, 01/12/2016 8:24 AM

## 2016-01-12 NOTE — BHH Group Notes (Signed)
[place] LCSW Group Therapy  01/12/2016 2:49 PM   Type of Therapy:  Group Therapy  Participation Level:  Active  Participation Quality:  Attentive  Affect:  Appropriate  Cognitive:  Appropriate  Insight:  Improving  Engagement in Therapy:  Engaged  Modes of Intervention:  Clarification, Education, Exploration and Socialization  Summary of Progress/Problems: Today's group focused on relapse prevention.  We defined the term, and then brainstormed on ways to prevent relapse.  Came late.  Was then called out twice.  Did not return until we were ending.  No meaningful contributions  Trish Mage 01/12/2016 , 2:49 PM

## 2016-01-12 NOTE — BHH Suicide Risk Assessment (Signed)
[place] INPATIENT:  Family/Significant Other Suicide Prevention Education  Suicide Prevention Education:  Education Completed; No one has been identified by the patient as the family member/significant other with whom the patient will be residing, and identified as the person(s) who will aid the patient in the event of a mental health crisis (suicidal ideations/suicide attempt).  With written consent from the patient, the family member/significant other has been provided the following suicide prevention education, prior to the and/or following the discharge of the patient.  The suicide prevention education provided includes the following:  Suicide risk factors  Suicide prevention and interventions  National Suicide Hotline telephone number  [facility] assessment telephone number  [facility] Emergency Assistance [place] and/or Residential Mobile Crisis Unit telephone number  Request made of family/significant other to:  Remove weapons (e.g., guns, rifles, knives), all items previously/currently identified as safety concern.    Remove drugs/medications (over-the-counter, prescriptions, illicit drugs), all items previously/currently identified as a safety concern.  The family member/significant other verbalizes understanding of the suicide prevention education information provided.  The family member/significant other agrees to remove the items of safety concern listed above. The patient did not endorse SI at the time of admission, nor did the patient c/o SI during the stay here.  SPE not required. However,I did talk to sister Aubreyana Arif, [phone] [phone], and went over treatment team recommendations and crises plan.  Roque Lias B 01/12/2016, 11:04 AM

## 2016-01-13 MED ORDER — POLYETHYLENE GLYCOL 3350 17 GM/SCOOP PO POWD: 17.0000 g | Freq: Two times a day (BID) | ORAL | Status: DC | PRN

## 2016-01-13 MED ORDER — FLUTICASONE PROPIONATE HFA 44 MCG/ACT IN AERO: 2.0000 | INHALATION_SPRAY | Freq: Two times a day (BID) | RESPIRATORY_TRACT | Status: DC

## 2016-01-13 MED ORDER — QUETIAPINE FUMARATE 50 MG PO TABS: 250.0000 mg | ORAL_TABLET | Freq: Every day | ORAL | Status: DC

## 2016-01-13 MED ORDER — HYDROXYZINE HCL 25 MG PO TABS: 25.0000 mg | ORAL_TABLET | Freq: Four times a day (QID) | ORAL | Status: DC | PRN

## 2016-01-13 MED ORDER — LAMOTRIGINE 25 MG PO TABS: ORAL_TABLET | ORAL | Status: DC

## 2016-01-13 MED ORDER — QUETIAPINE FUMARATE 50 MG PO TABS: 50.0000 mg | ORAL_TABLET | Freq: Three times a day (TID) | ORAL | Status: DC | PRN

## 2016-01-13 MED ORDER — QUETIAPINE FUMARATE 100 MG PO TABS
250.0000 mg | ORAL_TABLET | Freq: Every day | ORAL | Status: DC
  Filled 2016-01-13: qty 18

## 2016-01-13 MED ORDER — ALBUTEROL SULFATE HFA 108 (90 BASE) MCG/ACT IN AERS: 2.0000 | INHALATION_SPRAY | Freq: Four times a day (QID) | RESPIRATORY_TRACT | Status: DC | PRN

## 2016-01-13 MED ORDER — DOCUSATE SODIUM 100 MG PO CAPS: 100.0000 mg | ORAL_CAPSULE | Freq: Two times a day (BID) | ORAL | Status: DC

## 2016-01-13 MED ORDER — POLYETHYLENE GLYCOL 3350 17 G PO PACK: 17.0000 g | PACK | Freq: Every day | ORAL | Status: DC

## 2016-01-13 MED ORDER — LEVOTHYROXINE SODIUM 112 MCG PO TABS: 224.0000 ug | ORAL_TABLET | Freq: Every day | ORAL | Status: DC

## 2016-01-13 MED ORDER — FLUOXETINE HCL 20 MG PO CAPS: 20.0000 mg | ORAL_CAPSULE | Freq: Every day | ORAL | Status: DC

## 2016-01-13 MED ORDER — PHENYLEPH-SHARK LIV OIL-MO-PET 0.25-3-14-71.9 % RE OINT: TOPICAL_OINTMENT | Freq: Two times a day (BID) | RECTAL | Status: DC | PRN

## 2016-01-13 NOTE — Progress Notes (Signed)
  [facility] Adult Case Management Discharge Plan :  Will you be returning to the same living situation after discharge:  Yes,  home At discharge, do you have transportation home?: Yes,  friend Do you have the ability to pay for your medications: Yes,  mental health  Release of information consent forms completed and in the chart;  Patient's signature needed at discharge.  Patient to Follow up at: Follow-up Information    Follow up with [facility].   Why:  I called to reschedule your appointment with Royal Piedra. No one answered.  I left a message. Call them back to reschedule if you decide to see her again. You will need to bring $50.00 for your copay to this session   Contact information:   [place]      Follow up with [facility] On 01/14/2016.   Why:  Go to your hospital follow up appointment Wednesday at 8:30  AM with Theodis Sato.  Ask her about seeing a therapist there as well as medication management.  Another option is the [facility] at [place].   Contact information:   [place] [id number]      Next level of care provider has access to [place] and Suicide Prevention discussed: Yes,  yes  Have you used any form of tobacco in the last 30 days? (Cigarettes, Smokeless Tobacco, Cigars, and/or Pipes): No  Has patient been referred to the Quitline?: N/A patient is not a smoker  Patient has been referred for addiction treatment: N/A  Trish Mage 01/13/2016, 11:42 AM

## 2016-01-13 NOTE — BHH Suicide Risk Assessment (Signed)
[facility] Discharge Suicide Risk Assessment   Principal Problem: Schizoaffective disorder, bipolar type [facility]) Discharge Diagnoses:  Patient Active Problem List   Diagnosis Date Noted  . History of borderline personality disorder [Z86.59] 01/01/2016  . Carotidynia [G90.01] 12/16/2015  . Schizoaffective disorder, bipolar type ([place]) [F25.0] 11/26/2015  . Anxiety [F41.9]   . Pain of left calf [M79.662] 11/10/2015  . PTSD (post-traumatic stress disorder) [F43.10] 09/30/2015  . Medical clearance for psychiatric admission [Z00.8]   . Paranoia ([place]) [F22]   . Anemia [D64.9] 01/30/2015  . Unspecified vitamin D deficiency [E55.9] 05/22/2013  . Hypothyroidism (acquired) [E03.9] 11/29/2011  . Obesity [E66.9] 09/08/2006  . RHINITIS, ALLERGIC [J30.9] 09/08/2006  . Asthma [J45.909] 09/08/2006  . ECZEMA, ATOPIC DERMATITIS [L20.89] 09/08/2006    Total Time spent with patient: 30 minutes  Musculoskeletal: Strength & Muscle Tone: within normal limits Gait & Station: normal Patient leans: N/A  Psychiatric Specialty Exam: Review of Systems  Psychiatric/Behavioral: Negative for depression. The patient is not nervous/anxious.   All other systems reviewed and are negative.   Blood pressure 94/57, pulse 104, temperature 98.7 F (37.1 C), temperature source Oral, resp. rate 16, height 5' 6.5" (1.689 m), weight 102.513 kg (226 lb), last menstrual period 12/23/2015, SpO2 98 %.Body mass index is 35.94 kg/(m^2).  General Appearance: Casual  Eye Contact::  Fair  Speech:  Clear and Coherent409  Volume:  Normal  Mood:  Euthymic  Affect:  Appropriate  Thought Process:  Goal Directed and Descriptions of Associations: Intact  Orientation:  Full (Time, Place, and Person)  Thought Content:  Logical  Suicidal Thoughts:  No  Homicidal Thoughts:  No  Memory:  Immediate;   Fair Recent;   Fair Remote;   Fair  Judgement:  Fair  Insight:  Fair  Psychomotor Activity:  Normal  Concentration:  Fair  Recall:  [employer] of Knowledge:Fair  Language: Fair  Akathisia:  No  Handed:  Right  AIMS (if indicated):   0  Assets:  Communication Skills Desire for Improvement  Sleep:  Number of Hours: 6.75  Cognition: WNL  ADL's:  Intact   Mental Status Per Nursing Assessment::   On Admission:  NA  Demographic Factors:  NA  Loss Factors: Decrease in vocational status  Historical Factors: Impulsivity  Risk Reduction Factors:   Positive social support  Continued Clinical Symptoms:  Previous Psychiatric Diagnoses and Treatments  Cognitive Features That Contribute To Risk:  Polarized thinking    Suicide Risk:  Minimal: No identifiable suicidal ideation.  Patients presenting with no risk factors but with morbid ruminations; may be classified as minimal risk based on the severity of the depressive symptoms  Follow-up Information    Follow up with [facility].   Why:  I called to reschedule your appointment with Royal Piedra. No one answered.  I left a message. Call them back to reschedule if you decide to see her again. You will need to bring $50.00 for your copay to this session   Contact information:   [place]      Follow up with [facility] On 01/14/2016.   Why:  Go to your hospital follow up appointment Wednesday at 8:30  AM with Theodis Sato.  Ask her about seeing a therapist there as well as medication management.  Another option is the [facility] at [place].   Contact information:   [place] [id number]  Plan Of Care/Follow-up recommendations:  Activity:  no restrictions Diet:  regular Tests:  as needed Other:  follow up with aftercare  Cobi Aldape, MD 01/13/2016, 9:51 AM

## 2016-01-13 NOTE — Progress Notes (Signed)
Pt d/c from the hospital. All items returned. D/C instructions given, prescriptions given and samples given. Pt denies si and hi. 

## 2016-01-13 NOTE — Progress Notes (Signed)
Recreation Therapy Notes  nimal-Assisted Activity (AAA) Program Checklist/Progress Notes Patient Eligibility Criteria Checklist & Daily Group note for Rec Tx Intervention  Date: 07.04.2017 Time:11:15am Location: [address]    AAA/T Program Assumption of Risk Form signed by Patient/ or Parent Legal Guardian Yes  Patient is free of allergies or sever asthma Yes  Patient reports no fear of animals Yes  Patient reports no history of cruelty to animals Yes  Patient understands his/her participation is voluntary Yes  Behavioral Response: Did not attend.    Clinical Observations/Feedback: Patient discussed with MD for appropriateness in pet therapy session. Both LRT and MD agree patient is appropriate for participation. Patient offered participation in session, but declined at this time. Patient did not attend session.   Laureen Ochs Mykale Gandolfo, LRT/CTRS        Tanuj Mullens L 01/13/2016 11:28 AM

## 2016-01-13 NOTE — Progress Notes (Signed)
  D: Informed the writer that she may be discharged tomorrow or Wed according to if she can urinate or not. When asked how she felt about discharge pt stated, "she didn't know if she was ready yet". Stated, "I don't want to have to come back". Pt has no questions or concerns.   A:  Support and encouragement was offered. 15 min checks continued for safety.  R: Pt remains safe.

## 2016-01-13 NOTE — Discharge Summary (Signed)
Physician Discharge Summary Note  Patient:  Colleen Peck is an 35 y.o., female MRN:  [id number] DOB:  1981/05/13 Patient phone:  [phone] (home)  Patient address:   [place] [address],   Total Time spent with patient: 30 minutes  Date of Admission:  12/31/2015  Date of Discharge: 01/13/2016  Reason for Admission: Suicidal ideations.   Principal Problem: Schizoaffective disorder, bipolar type [facility]  Discharge Diagnoses: Patient Active Problem List   Diagnosis Date Noted  . History of borderline personality disorder [Z86.59] 01/01/2016  . Carotidynia [G90.01] 12/16/2015  . Schizoaffective disorder, bipolar type ([place]) [F25.0] 11/26/2015  . Anxiety [F41.9]   . Pain of left calf [M79.662] 11/10/2015  . PTSD (post-traumatic stress disorder) [F43.10] 09/30/2015  . Medical clearance for psychiatric admission [Z00.8]   . Paranoia ([place]) [F22]   . Anemia [D64.9] 01/30/2015  . Unspecified vitamin D deficiency [E55.9] 05/22/2013  . Hypothyroidism (acquired) [E03.9] 11/29/2011  . Obesity [E66.9] 09/08/2006  . RHINITIS, ALLERGIC [J30.9] 09/08/2006  . Asthma [J45.909] 09/08/2006  . ECZEMA, ATOPIC DERMATITIS [L20.89] 09/08/2006   Past Psychiatric History:  Hx. Schizoaffective disorder  Past Medical History:  Past Medical History  Diagnosis Date  . Asthma   . Seasonal allergies   . Depression   . PTSD (post-traumatic stress disorder)   . Thyroid disease 2009    Graves disease (pt reported resolved); hypothyriodism  . Abnormal pap     pt reports abnl pap many years ago.  Nl since then.  . Palpitations 03/12/2008  . Bipolar disorder [facility])     Past Surgical History  Procedure Laterality Date  . Dilation and curettage of uterus  March 2006   Family History:  Family History  Problem Relation Age of Onset  . Drug abuse Father   . Depression Maternal Aunt   . Depression Maternal Grandmother   . Anxiety disorder Maternal Grandmother   . COPD Maternal  Grandmother   . Suicidality Cousin   . Depression Cousin   . Bipolar disorder Cousin   . Depression Maternal Aunt   . Hypertension Mother   . Depression Mother   . Diabetes Paternal Grandfather   . COPD Paternal Grandmother   . Heart disease Neg Hx    Family Psychiatric  History: See H&P  Social History:  History  Alcohol Use No     History  Drug Use No    Comment: past use of marijuana in '08-'09. occasional eats brownies w/ marijuana  before thanksgiving    Social History   Social History  . Marital Status: Single    Spouse Name: N/A  . Number of Children: N/A  . Years of Education: N/A   Social History Main Topics  . Smoking status: Never Smoker   . Smokeless tobacco: Never Used  . Alcohol Use: No  . Drug Use: No     Comment: past use of marijuana in '08-'09. occasional eats brownies w/ marijuana  before thanksgiving  . Sexual Activity: Not Currently    Birth Control/ Protection: Abstinence   Other Topics Concern  . 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   Hospital Course: Colleen Peck is a 35 y.o., single AA female, who is unemployed, has a past hx of PTSD, schizoaffective do as well as borderline Personality disorder who presented to [facility] for paranoia and mood lability.  Colleen Peck presenting to [facility] ED reporting increasing depression  and anxiety. She reported that she feels overwhelmed and shared that she has a lot of decisions to make and she is stressing about making the right decisions. Pt shared that she is dealing with a lot of stressor and is also endorsing multiple depressive symptoms. Pt reported that she has experienced some weight changes and her appetite is poor. Pt also shared that she spends a lot of time in the bed and she is hesitant about going outside due to believing that something bad will happen. Pt reported that she is compliant with her medication but shared  that she continues to have crying spells and stated "I have a deep cry". Pt shared that her therapist is in the process of referring her to the [facility]. Pt did not report any recent drug or alcohol use.   Colleen Peck was admitted to the [facility] adult unit for worsening symptoms of Schizoaffective disorder. She reported medication compliant, only that she felt her medications were no longer effective in managing her symptoms. Colleen Peck presented symptoms that she described as increased crying spells, poor appetite, paranoia & self isolation. She was in need of mood stabilization treatments. After admission assessment/evaluation, it was determined based on her presenting symptoms that Colleen Peck will benefit from medication regimen to re-stabilize her worsening symptoms. And with her consent, she was started on the medication regimen that purposefully targeted those symptoms. She was instructed & explained the benefit/adverse effects of the medication that will be prescribed for her. She was given the time to ask questions and voice any concerns that she may have. Colleen Peck did agree to start the medications for her symptoms. She was medicated & discharged on; Lamictal 25 mg & 50 mg respectively for mood stabilization, Seroquel 250 mg & 50 mg respectively for mood control & agitation, Hydroxyzine 25 mg prn for anxiety & Fluoxetine 20 mg for depression. She was enrolled & participated in the group counseling sessions being offered and held on this unit. She learned coping skills that should help her after discharge to cope better & maintain mood stability. She was resumed on all her pertinent home medications for her other medical issues. She tolerated her treatment regimen without any adverse effects.  Colleen Peck's symptoms responded well to her treatment regimen. This is evidenced by her reports of improved mood, absence of suicidal ideations, homicidal ideations auditory/visual hallucinations, delusional thoughts or  paranoia. She is currently being discharged to continue psychiatric treatment & medication management on outpatient basis. She is provided with all the pertinent information required to make this appointment without problems. Upon discharge, she adamantly denies any SIHI, AVH, delusional thoughts and or paranoia. Colleen Peck left [facility] with all personal belongings in no apparent distress. She was provided with a 7 days worth, supply samples of his [facility] discharge medications. Transportation per family.   Physical Findings: AIMS: Facial and Oral Movements Muscles of Facial Expression: None, normal Lips and Perioral Area: None, normal Jaw: None, normal Tongue: None, normal,Extremity Movements Upper (arms, wrists, hands, fingers): None, normal Lower (legs, knees, ankles, toes): None, normal, Trunk Movements Neck, shoulders, hips: None, normal, Overall Severity Severity of abnormal movements (highest score from questions above): None, normal Incapacitation due to abnormal movements: None, normal Patient's awareness of abnormal movements (rate only patient's report): No Awareness, Dental Status Current problems with teeth and/or dentures?: No Does patient usually wear dentures?: No  CIWA:  CIWA-Ar Total: 0 COWS:  COWS Total Score: 0  Musculoskeletal: Strength & Muscle Tone: within normal limits Gait & Station:  normal Patient leans: N/A  Psychiatric Specialty Exam: Review of Systems  Constitutional: Negative.   HENT: Negative.   Eyes: Negative.   Respiratory: Negative.   Cardiovascular: Negative.   Gastrointestinal: Negative.   Genitourinary: Negative.   Musculoskeletal: Negative.   Skin: Negative.   Neurological: Negative.   Endo/Heme/Allergies: Negative.   Psychiatric/Behavioral: Positive for depression (Stable). Negative for suicidal ideas, hallucinations, memory loss and substance abuse. The patient has insomnia (Stable). The patient is not nervous/anxious.   All other systems reviewed and  are negative.   Blood pressure 94/57, pulse 104, temperature 98.7 F (37.1 C), temperature source Oral, resp. rate 16, height 5' 6.5" (1.689 m), weight 102.513 kg (226 lb), last menstrual period 12/23/2015, SpO2 98 %.Body mass index is 35.94 kg/(m^2).  Have you used any form of tobacco in the last 30 days? (Cigarettes, Smokeless Tobacco, Cigars, and/or Pipes): No  Has this patient used any form of tobacco in the last 30 days? (Cigarettes, Smokeless Tobacco, Cigars, and/or Pipes): N/A  Blood Alcohol level:  Lab Results  Component Value Date   [facility] <5 11/25/2015   ETH <5 [id number]   Metabolic Disorder Labs:  Lab Results  Component Value Date   HGBA1C 6.1* 08/21/2015   MPG 128 08/21/2015   Lab Results  Component Value Date   PROLACTIN 8.8 10/05/2015   PROLACTIN 27.1* 08/21/2015   Lab Results  Component Value Date   CHOL 150 10/05/2015   TRIG 192* 10/05/2015   HDL 32* 10/05/2015   CHOLHDL 4.7 10/05/2015   VLDL 38 10/05/2015   LDLCALC 80 10/05/2015   LDLCALC 122* 08/21/2015   See Psychiatric Specialty Exam and Suicide Risk Assessment completed by Attending Physician prior to discharge.  Discharge destination:  Home  Is patient on multiple antipsychotic therapies at discharge:  No   Has Patient had three or more failed trials of antipsychotic monotherapy by history:  No  Recommended Plan for Multiple Antipsychotic Therapies: NA    Medication List    STOP taking these medications        benztropine 0.5 MG tablet  Commonly known as:  COGENTIN     diclofenac 75 MG EC tablet  Commonly known as:  VOLTAREN     ferrous sulfate 325 (65 FE) MG tablet     FLUoxetine 20 MG tablet  Commonly known as:  PROZAC  Replaced by:  FLUoxetine 20 MG capsule     OXcarbazepine 150 MG tablet  Commonly known as:  TRILEPTAL     risperiDONE 0.5 MG tablet  Commonly known as:  RISPERDAL     traZODone 100 MG tablet  Commonly known as:  DESYREL      TAKE these medications       Indication   albuterol 108 (90 Base) MCG/ACT inhaler  Commonly known as:  PROVENTIL HFA;VENTOLIN HFA  Inhale 2 puffs into the lungs every 6 (six) hours as needed for wheezing or shortness of breath.   Indication:  Asthma     docusate sodium 100 MG capsule  Commonly known as:  COLACE  Take 1 capsule (100 mg total) by mouth 2 (two) times daily. For constipation   Indication:  Constipation     FLUoxetine 20 MG capsule  Commonly known as:  PROZAC  Take 1 capsule (20 mg total) by mouth daily. For depression   Indication:  Major Depressive Disorder     fluticasone 44 MCG/ACT inhaler  Commonly known as:  FLOVENT HFA  Inhale 2 puffs into the lungs 2 (two)  times daily. For Asthma   Indication:  Asthma     hydrOXYzine 25 MG tablet  Commonly known as:  ATARAX/VISTARIL  Take 1 tablet (25 mg total) by mouth every 6 (six) hours as needed for anxiety or itching.   Indication:  Anxiety     lamoTRIgine 25 MG tablet  Commonly known as:  LAMICTAL  Take 1 tablet (25 mg) in the morning & 2 tablets (50 mg) in the evening: For mood stabilization   Indication:  Mood stabilization     levothyroxine 112 MCG tablet  Commonly known as:  SYNTHROID, LEVOTHROID  Take 2 tablets (224 mcg total) by mouth daily before breakfast. For low thyroid hormone   Indication:  Underactive Thyroid     phenylephrine-shark liver oil-mineral oil-petrolatum 0.25-3-14-71.9 % rectal ointment  Commonly known as:  PREPARATION H  Place rectally 2 (two) times daily as needed for hemorrhoids.   Indication:  Hemorroids     polyethylene glycol powder powder  Commonly known as:  GLYCOLAX/MIRALAX  Take 17 g by mouth 2 (two) times daily as needed. For constipation   Indication:  Constipation     QUEtiapine 50 MG tablet  Commonly known as:  SEROQUEL  Take 5 tablets (250 mg total) by mouth daily at 8 pm. For mood control   Indication:  Mood control     QUEtiapine 50 MG tablet  Commonly known as:  SEROQUEL  Take 1 tablet (50 mg  total) by mouth 3 (three) times daily as needed (severe anxiety/agitation).   Indication:  Agitation/anxiety       Follow-up Information    Follow up with [facility].   Why:  I called to reschedule your appointment with [facility]. No one answered.  I left a message. Call them back to reschedule if you decide to see her again. You will need to bring $50.00 for your copay to this session   Contact information:   [place]      Follow up with [facility] On 01/14/2016.   Why:  Go to your hospital follow up appointment Wednesday at 8:30  AM with Theodis Sato.  Ask her about seeing a therapist there as well as medication management.  Another option is the [facility] [place].   Contact information:   [facility] [id number]     Follow-up recommendations: Activity:  As tolerated Diet: As recommended by your primary care doctor. Keep all scheduled follow-up appointments as recommended.   Comments:  Patient is instructed prior to discharge to: Take all medications as prescribed by his/her mental healthcare provider. Report any adverse effects and or reactions from the medicines to his/her outpatient provider promptly. Patient has been instructed & cautioned: To not engage in alcohol and or illegal drug use while on prescription medicines. In the event of worsening symptoms, patient is instructed to call the crisis hotline, 911 and or go to the nearest ED for appropriate evaluation and treatment of symptoms. To follow-up with his/her primary care provider for your other medical issues, concerns and or health care needs.   Signed: Encarnacion Slates, NP PMHNP-BC, FNP-BC 01/13/2016, 2:53 PM  Patient seen, Suicide Assessment Completed.  Disposition Plan Reviewed

## 2016-01-20 NOTE — Discharge Summary (Signed)
[facility] OBS UNIT DISCHARGE SUMMARY *Due to short LOS in OBS, pt seen once*  Patient Identification: Colleen Peck MRN:  [id number] Principal Diagnosis: MDD (major depressive disorder), recurrent severe, without psychosis ([place]) Diagnosis:   Patient Active Problem List   Diagnosis Date Noted  . MDD (major depressive disorder), recurrent severe, without psychosis ([place]) [F33.2] 12/31/2015    Priority: High  . Carotidynia [G90.01] 12/16/2015  . Schizoaffective disorder, bipolar type ([place]) [F25.0] 11/26/2015  . Anxiety [F41.9]   . Pain of left calf [M79.662] 11/10/2015  . Bipolar disorder, curr episode mixed, severe, with psychotic features ([place]) [F31.64] 09/30/2015  . PTSD (post-traumatic stress disorder) [F43.10] 09/30/2015  . Medical clearance for psychiatric admission [Z00.8]   . Paranoia ([place]) [F22]   . Anemia [D64.9] 01/30/2015  . Unspecified vitamin D deficiency [E55.9] 05/22/2013  . Hypothyroidism (acquired) [E03.9] 11/29/2011  . Obesity [E66.9] 09/08/2006  . RHINITIS, ALLERGIC [J30.9] 09/08/2006  . Asthma [J45.909] 09/08/2006  . ECZEMA, ATOPIC DERMATITIS [L20.89] 09/08/2006    Total Time spent with patient: 45 minutes  Discharge update: Pt suitable for inpatient psychiatric hospitalization for safety and stabilization; see below  Subjective:   Colleen Peck is a 35 y.o. female patient admitted with reports of seeking detox yesterday. She was discharged due to no longterm rehab beds, then walked right back into the ED stating she felt suicidal. Pt then told staff she isn't really suicidal but wants rehab. Pt continues to deny suicidal ideation and reports that she would like to go with family to a halfway house in [place]. Pt denies suicidal/homicidal ideation and psychosis and does not appear to be responding to internal stimuli.   However, pt has become increasingly paranoid and would benefit from inpatient psychiatric stabilization. Pt admitted to inpatient 500 hall with  Dr. Shea Evans.   HPI:  I have reviewed and concur with HPI elements below, modified as follows:  Colleen Peck is an 35 y.o. female presenting to [facility] ED reporting increasing depression and anxiety. PT reported that she feels overwhelm and shared that she has a lot of decisions to make and she is stressing about making the right decisions. Pt denies SI, HI and AVH at this time. Pt did not report any previous suicide attempts or self-injurious behaviors. Pt shared that she is dealing with a lot of stressor and is also endorsing multiple depressive symptoms. Pt reported that she has experienced some weight changes and her appetite is poor. Pt also shared that she spends a lot of time in the bed and she is hesitant about going outside due to believing that something bad will happen. Pt reported that she is compliant with her medication but shared that she continues to have crying spells and stated "I have a deep cry". Pt shared that her therapist is in the process of referring her to the [facility]. Pt did not report any recent drug or alcohol use.   Past Psychiatric History: MDD, psychosis, substance abuse, Bipolar  Risk to Self: Is patient at risk for suicide?: No Risk to Others:   Prior Inpatient Therapy:   Prior Outpatient Therapy:    Past Medical History:  Past Medical History  Diagnosis Date  . Asthma   . Seasonal allergies   . Depression   . PTSD (post-traumatic stress disorder)   . Thyroid disease 2009    Graves disease (pt reported resolved); hypothyriodism  . Abnormal pap     pt reports abnl pap many years ago.  Nl since then.  Marland Kitchen  Palpitations 03/12/2008  . Bipolar disorder [facility])     Past Surgical History  Procedure Laterality Date  . Dilation and curettage of uterus  March 2006   Family History:  Family History  Problem Relation Age of Onset  . Drug abuse Father   . Depression Maternal Aunt   . Depression Maternal Grandmother   . Anxiety disorder Maternal  Grandmother   . COPD Maternal Grandmother   . Suicidality Cousin   . Depression Cousin   . Bipolar disorder Cousin   . Depression Maternal Aunt   . Hypertension Mother   . Depression Mother   . Diabetes Paternal Grandfather   . COPD Paternal Grandmother   . Heart disease Neg Hx    Family Psychiatric  History: MDD Social History:  History  Alcohol Use No     History  Drug Use No    Comment: past use of marijuana in '08-'09. occasional eats brownies w/ marijuana  before thanksgiving    Social History   Social History  . Marital Status: Single    Spouse Name: N/A  . Number of Children: N/A  . Years of Education: N/A   Social History Main Topics  . Smoking status: Never Smoker   . Smokeless tobacco: Never Used  . Alcohol Use: No  . Drug Use: No     Comment: past use of marijuana in '08-'09. occasional eats brownies w/ marijuana  before thanksgiving  . Sexual Activity: Not Currently    Birth Control/ Protection: Abstinence   Other Topics Concern  . 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   Additional Social History:    Allergies:   Allergies  Allergen Reactions  . Abilify [Aripiprazole] Other (See Comments)    AKATHISIA    Labs:  Results for orders placed or performed during the hospital encounter of 12/30/15 (from the past 48 hour(s))  Urinalysis, Routine w reflex microscopic     Status: Abnormal   Collection Time: 12/30/15  8:24 PM  Result Value Ref Range   Color, Urine YELLOW YELLOW   APPearance CLEAR CLEAR   Specific Gravity, Urine 1.026 1.005 - 1.030   pH 6.0 5.0 - 8.0   Glucose, UA NEGATIVE NEGATIVE mg/dL   Hgb urine dipstick NEGATIVE NEGATIVE   Bilirubin Urine NEGATIVE NEGATIVE   Ketones, ur 15 (A) NEGATIVE mg/dL   Protein, ur NEGATIVE NEGATIVE mg/dL   Nitrite NEGATIVE NEGATIVE   Leukocytes, UA SMALL (A) NEGATIVE  Urine microscopic-add on     Status: Abnormal    Collection Time: 12/30/15  8:24 PM  Result Value Ref Range   Squamous Epithelial / LPF 6-30 (A) NONE SEEN   WBC, UA 0-5 0 - 5 WBC/hpf   RBC / HPF 0-5 0 - 5 RBC/hpf   Bacteria, UA FEW (A) NONE SEEN   Urine-Other MUCOUS PRESENT   Lipase, blood     Status: None   Collection Time: 12/30/15  8:28 PM  Result Value Ref Range   Lipase 19 11 - 51 U/L  Comprehensive metabolic panel     Status: None   Collection Time: 12/30/15  8:28 PM  Result Value Ref Range   Sodium 136 135 - 145 mmol/L   Potassium 3.5 3.5 - 5.1 mmol/L   Chloride 104 101 - 111 mmol/L   CO2 23 22 - 32 mmol/L   Glucose, Bld 98 65 - 99 mg/dL   BUN  7 6 - 20 mg/dL   Creatinine, Ser 0.86 0.44 - 1.00 mg/dL   Calcium 9.9 8.9 - 10.3 mg/dL   Total Protein 7.5 6.5 - 8.1 g/dL   Albumin 4.0 3.5 - 5.0 g/dL   AST 19 15 - 41 U/L   ALT 21 14 - 54 U/L   Alkaline Phosphatase 59 38 - 126 U/L   Total Bilirubin 0.6 0.3 - 1.2 mg/dL   GFR calc non Af Amer >60 >60 mL/min   GFR calc Af Amer >60 >60 mL/min    Comment: (NOTE) The eGFR has been calculated using the CKD EPI equation. This calculation has not been validated in all clinical situations. eGFR's persistently <60 mL/min signify possible Chronic Kidney Disease.    Anion gap 9 5 - 15  CBC     Status: Abnormal   Collection Time: 12/30/15  8:28 PM  Result Value Ref Range   WBC 6.1 4.0 - 10.5 K/uL   RBC 4.55 3.87 - 5.11 MIL/uL   Hemoglobin 11.9 (L) 12.0 - 15.0 g/dL   HCT 37.2 36.0 - 46.0 %   MCV 81.8 78.0 - 100.0 fL   MCH 26.2 26.0 - 34.0 pg   MCHC 32.0 30.0 - 36.0 g/dL   RDW 12.9 11.5 - 15.5 %   Platelets 340 150 - 400 K/uL  I-Stat beta hCG blood, ED     Status: None   Collection Time: 12/30/15  8:40 PM  Result Value Ref Range   I-stat hCG, quantitative <5.0 <5 mIU/mL   Comment 3            Comment:   GEST. AGE      CONC.  (mIU/mL)   <=1 WEEK        5 - 50     2 WEEKS       50 - 500     3 WEEKS       100 - 10,000     4 WEEKS     1,000 - 30,000        FEMALE AND NON-PREGNANT  FEMALE:     LESS THAN 5 mIU/mL     Current Facility-Administered Medications  Medication Dose Route Frequency Provider Last Rate Last Dose  . acetaminophen (TYLENOL) tablet 650 mg  650 mg Oral Q6H PRN Benjamine Mola, FNP      . alum & mag hydroxide-simeth (MAALOX/MYLANTA) 200-200-20 MG/5ML suspension 30 mL  30 mL Oral Q4H PRN Benjamine Mola, FNP      . benztropine (COGENTIN) tablet 0.5 mg  0.5 mg Oral BID Benjamine Mola, FNP   0.5 mg at 01/01/16 0830  . FLUoxetine (PROZAC) capsule 20 mg  20 mg Oral Daily Saramma Eappen, MD   20 mg at 01/01/16 0830  . hydrOXYzine (ATARAX/VISTARIL) tablet 25 mg  25 mg Oral Q6H PRN Benjamine Mola, FNP      . levothyroxine (SYNTHROID, LEVOTHROID) tablet 112 mcg  112 mcg Oral QAC breakfast Benjamine Mola, FNP   112 mcg at 01/01/16 [phone]  . magnesium hydroxide (MILK OF MAGNESIA) suspension 30 mL  30 mL Oral Daily PRN Benjamine Mola, FNP      . OLANZapine zydis (ZYPREXA) disintegrating tablet 5 mg  5 mg Oral Q8H PRN Benjamine Mola, FNP      . OXcarbazepine (TRILEPTAL) tablet 300 mg  300 mg Oral BID Benjamine Mola, FNP   300 mg at 01/01/16 0830  . risperiDONE (RISPERDAL) tablet 0.5 mg  0.5 mg Oral BID Benjamine Mola, FNP   0.5 mg at 01/01/16 0830  . traZODone (DESYREL) tablet 100 mg  100 mg Oral QHS Benjamine Mola, FNP   100 mg at 12/31/15 2310    Musculoskeletal: Strength & Muscle Tone: within normal limits Gait & Station: normal Patient leans:    Psychiatric Specialty Exam: Physical Exam  Review of Systems  Psychiatric/Behavioral: Positive for depression, hallucinations and substance abuse. Negative for suicidal ideas. The patient is nervous/anxious and has insomnia.   All other systems reviewed and are negative.   Blood pressure 115/80, pulse 69, temperature 98.4 F (36.9 C), temperature source Oral, resp. rate 18, height 5' 6.5" (1.689 m), weight 102.513 kg (226 lb), last menstrual period 12/23/2015, SpO2 98 %.Body mass index is 35.94 kg/(m^2).  General  Appearance: Casual and Fairly Groomed  Eye Contact:  Good  Speech:  Clear and Coherent and Normal Rate  Volume:  Normal  Mood:  Anxious and Depressed  Affect:  Appropriate, Congruent and Depressed  Thought Process:  Coherent, Linear and Descriptions of Associations: Loose  Orientation:  Full (Time, Place, and Person)  Thought Content:  Feelings that others are against her, paranoid ideation  Suicidal Thoughts:  No  Homicidal Thoughts:  No  Memory:  Immediate;   Fair Recent;   Fair Remote;   Fair  Judgement:  Fair  Insight:  Fair  Psychomotor Activity:  Normal  Concentration:  Concentration: Fair and Attention Span: Fair  Recall:  [employer] of Knowledge:  Fair  Language:  Fair  Akathisia:  No  Handed:    AIMS (if indicated):     Assets:  Communication Skills Desire for Improvement Physical Health Resilience Social Support  ADL's:  Intact  Cognition:  WNL  Sleep:      Treatment Plan Summary: Schizoaffective disorder, bipolar type ([place]), unstable, warrants inpatient admission for stabilization.  Medications: -Celexa 26m po daily -Vistaril 239mpo q6h prn anxiety  Disposition: Inpatient psychiatric admission for stabilization  WiBenjamine MolaFNP 01/01/2016 3:45 PM

## 2016-01-20 NOTE — H&P (Signed)
[facility] OBS UNIT H&P  Patient Identification: Colleen Peck MRN:  [id number] Principal Diagnosis: MDD (major depressive disorder), recurrent severe, without psychosis ([place]) Diagnosis:   Patient Active Problem List   Diagnosis Date Noted  . MDD (major depressive disorder), recurrent severe, without psychosis ([place]) [F33.2] 12/31/2015    Priority: High  . Carotidynia [G90.01] 12/16/2015  . Schizoaffective disorder, bipolar type ([place]) [F25.0] 11/26/2015  . Anxiety [F41.9]   . Pain of left calf [M79.662] 11/10/2015  . Bipolar disorder, curr episode mixed, severe, with psychotic features ([place]) [F31.64] 09/30/2015  . PTSD (post-traumatic stress disorder) [F43.10] 09/30/2015  . Medical clearance for psychiatric admission [Z00.8]   . Paranoia ([place]) [F22]   . Anemia [D64.9] 01/30/2015  . Unspecified vitamin D deficiency [E55.9] 05/22/2013  . Hypothyroidism (acquired) [E03.9] 11/29/2011  . Obesity [E66.9] 09/08/2006  . RHINITIS, ALLERGIC [J30.9] 09/08/2006  . Asthma [J45.909] 09/08/2006  . ECZEMA, ATOPIC DERMATITIS [L20.89] 09/08/2006    Total Time spent with patient: 45 minutes  Subjective:   Colleen Peck is a 35 y.o. female patient admitted with reports of seeking detox yesterday. She was discharged due to no longterm rehab beds, then walked right back into the ED stating she felt suicidal. Pt then told staff she isn't really suicidal but wants rehab. Pt continues to deny suicidal ideation and reports that she would like to go with family to a halfway house in [place]. Pt denies suicidal/homicidal ideation and psychosis and does not appear to be responding to internal stimuli.   However, pt has become increasingly paranoid and would benefit from inpatient psychiatric stabilization. Pt admitted to inpatient 500 hall with Dr. Shea Evans.   HPI:  I have reviewed and concur with HPI elements below, modified as follows:  [name] is an 35 y.o. female presenting to [facility] ED reporting  increasing depression and anxiety. PT reported that she feels overwhelm and shared that she has a lot of decisions to make and she is stressing about making the right decisions. Pt denies SI, HI and AVH at this time. Pt did not report any previous suicide attempts or self-injurious behaviors. Pt shared that she is dealing with a lot of stressor and is also endorsing multiple depressive symptoms. Pt reported that she has experienced some weight changes and her appetite is poor. Pt also shared that she spends a lot of time in the bed and she is hesitant about going outside due to believing that something bad will happen. Pt reported that she is compliant with her medication but shared that she continues to have crying spells and stated "I have a deep cry". Pt shared that her therapist is in the process of referring her to the [facility]. Pt did not report any recent drug or alcohol use.   Past Psychiatric History: MDD, psychosis, substance abuse, Bipolar  Risk to Self: Is patient at risk for suicide?: No Risk to Others:   Prior Inpatient Therapy:   Prior Outpatient Therapy:    Past Medical History:  Past Medical History  Diagnosis Date  . Asthma   . Seasonal allergies   . Depression   . PTSD (post-traumatic stress disorder)   . Thyroid disease 2009    Graves disease (pt reported resolved); hypothyriodism  . Abnormal pap     pt reports abnl pap many years ago.  Nl since then.  . Palpitations 03/12/2008  . Bipolar disorder [facility])     Past Surgical History  Procedure Laterality Date  . Dilation and curettage of  uterus  March 2006   Family History:  Family History  Problem Relation Age of Onset  . Drug abuse Father   . Depression Maternal Aunt   . Depression Maternal Grandmother   . Anxiety disorder Maternal Grandmother   . COPD Maternal Grandmother   . Suicidality Cousin   . Depression Cousin   . Bipolar disorder Cousin   . Depression Maternal Aunt   . Hypertension Mother    . Depression Mother   . Diabetes Paternal Grandfather   . COPD Paternal Grandmother   . Heart disease Neg Hx    Family Psychiatric  History: MDD Social History:  History  Alcohol Use No     History  Drug Use No    Comment: past use of marijuana in '08-'09. occasional eats brownies w/ marijuana  before thanksgiving    Social History   Social History  . Marital Status: Single    Spouse Name: N/A  . Number of Children: N/A  . Years of Education: N/A   Social History Main Topics  . Smoking status: Never Smoker   . Smokeless tobacco: Never Used  . Alcohol Use: No  . Drug Use: No     Comment: past use of marijuana in '08-'09. occasional eats brownies w/ marijuana  before thanksgiving  . Sexual Activity: Not Currently    Birth Control/ Protection: Abstinence   Other Topics Concern  . 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   Additional Social History:    Allergies:   Allergies  Allergen Reactions  . Abilify [Aripiprazole] Other (See Comments)    AKATHISIA    Labs:  Results for orders placed or performed during the hospital encounter of 12/30/15 (from the past 48 hour(s))  Urinalysis, Routine w reflex microscopic     Status: Abnormal   Collection Time: 12/30/15  8:24 PM  Result Value Ref Range   Color, Urine YELLOW YELLOW   APPearance CLEAR CLEAR   Specific Gravity, Urine 1.026 1.005 - 1.030   pH 6.0 5.0 - 8.0   Glucose, UA NEGATIVE NEGATIVE mg/dL   Hgb urine dipstick NEGATIVE NEGATIVE   Bilirubin Urine NEGATIVE NEGATIVE   Ketones, ur 15 (A) NEGATIVE mg/dL   Protein, ur NEGATIVE NEGATIVE mg/dL   Nitrite NEGATIVE NEGATIVE   Leukocytes, UA SMALL (A) NEGATIVE  Urine microscopic-add on     Status: Abnormal   Collection Time: 12/30/15  8:24 PM  Result Value Ref Range   Squamous Epithelial / LPF 6-30 (A) NONE SEEN   WBC, UA 0-5 0 - 5 WBC/hpf   RBC / HPF 0-5 0 - 5 RBC/hpf    Bacteria, UA FEW (A) NONE SEEN   Urine-Other MUCOUS PRESENT   Lipase, blood     Status: None   Collection Time: 12/30/15  8:28 PM  Result Value Ref Range   Lipase 19 11 - 51 U/L  Comprehensive metabolic panel     Status: None   Collection Time: 12/30/15  8:28 PM  Result Value Ref Range   Sodium 136 135 - 145 mmol/L   Potassium 3.5 3.5 - 5.1 mmol/L   Chloride 104 101 - 111 mmol/L   CO2 23 22 - 32 mmol/L   Glucose, Bld 98 65 - 99 mg/dL   BUN 7 6 - 20 mg/dL   Creatinine, Ser 0.86 0.44 - 1.00 mg/dL   Calcium 9.9 8.9 - 10.3 mg/dL  Total Protein 7.5 6.5 - 8.1 g/dL   Albumin 4.0 3.5 - 5.0 g/dL   AST 19 15 - 41 U/L   ALT 21 14 - 54 U/L   Alkaline Phosphatase 59 38 - 126 U/L   Total Bilirubin 0.6 0.3 - 1.2 mg/dL   GFR calc non Af Amer >60 >60 mL/min   GFR calc Af Amer >60 >60 mL/min    Comment: (NOTE) The eGFR has been calculated using the CKD EPI equation. This calculation has not been validated in all clinical situations. eGFR's persistently <60 mL/min signify possible Chronic Kidney Disease.    Anion gap 9 5 - 15  CBC     Status: Abnormal   Collection Time: 12/30/15  8:28 PM  Result Value Ref Range   WBC 6.1 4.0 - 10.5 K/uL   RBC 4.55 3.87 - 5.11 MIL/uL   Hemoglobin 11.9 (L) 12.0 - 15.0 g/dL   HCT 37.2 36.0 - 46.0 %   MCV 81.8 78.0 - 100.0 fL   MCH 26.2 26.0 - 34.0 pg   MCHC 32.0 30.0 - 36.0 g/dL   RDW 12.9 11.5 - 15.5 %   Platelets 340 150 - 400 K/uL  I-Stat beta hCG blood, ED     Status: None   Collection Time: 12/30/15  8:40 PM  Result Value Ref Range   I-stat hCG, quantitative <5.0 <5 mIU/mL   Comment 3            Comment:   GEST. AGE      CONC.  (mIU/mL)   <=1 WEEK        5 - 50     2 WEEKS       50 - 500     3 WEEKS       100 - 10,000     4 WEEKS     1,000 - 30,000        FEMALE AND NON-PREGNANT FEMALE:     LESS THAN 5 mIU/mL     Current Facility-Administered Medications  Medication Dose Route Frequency Provider Last Rate Last Dose  . acetaminophen  (TYLENOL) tablet 650 mg  650 mg Oral Q6H PRN Benjamine Mola, FNP      . alum & mag hydroxide-simeth (MAALOX/MYLANTA) 200-200-20 MG/5ML suspension 30 mL  30 mL Oral Q4H PRN Benjamine Mola, FNP      . benztropine (COGENTIN) tablet 0.5 mg  0.5 mg Oral BID Benjamine Mola, FNP   0.5 mg at 01/01/16 0830  . FLUoxetine (PROZAC) capsule 20 mg  20 mg Oral Daily Saramma Eappen, MD   20 mg at 01/01/16 0830  . hydrOXYzine (ATARAX/VISTARIL) tablet 25 mg  25 mg Oral Q6H PRN Benjamine Mola, FNP      . levothyroxine (SYNTHROID, LEVOTHROID) tablet 112 mcg  112 mcg Oral QAC breakfast Benjamine Mola, FNP   112 mcg at 01/01/16 [phone]  . magnesium hydroxide (MILK OF MAGNESIA) suspension 30 mL  30 mL Oral Daily PRN Benjamine Mola, FNP      . OLANZapine zydis (ZYPREXA) disintegrating tablet 5 mg  5 mg Oral Q8H PRN Benjamine Mola, FNP      . OXcarbazepine (TRILEPTAL) tablet 300 mg  300 mg Oral BID Benjamine Mola, FNP   300 mg at 01/01/16 0830  . risperiDONE (RISPERDAL) tablet 0.5 mg  0.5 mg Oral BID Benjamine Mola, FNP   0.5 mg at 01/01/16 0830  . traZODone (DESYREL) tablet 100 mg  100  mg Oral QHS Benjamine Mola, FNP   100 mg at 12/31/15 2310    Musculoskeletal: Strength & Muscle Tone: within normal limits Gait & Station: normal Patient leans:    Psychiatric Specialty Exam: Physical Exam  Review of Systems  Psychiatric/Behavioral: Positive for depression, hallucinations and substance abuse. Negative for suicidal ideas. The patient is nervous/anxious and has insomnia.   All other systems reviewed and are negative.   Blood pressure 115/80, pulse 69, temperature 98.4 F (36.9 C), temperature source Oral, resp. rate 18, height 5' 6.5" (1.689 m), weight 102.513 kg (226 lb), last menstrual period 12/23/2015, SpO2 98 %.Body mass index is 35.94 kg/(m^2).  General Appearance: Casual and Fairly Groomed  Eye Contact:  Good  Speech:  Clear and Coherent and Normal Rate  Volume:  Normal  Mood:  Anxious and Depressed  Affect:   Appropriate, Congruent and Depressed  Thought Process:  Coherent, Linear and Descriptions of Associations: Loose  Orientation:  Full (Time, Place, and Person)  Thought Content:  Feelings that others are against her, paranoid ideation  Suicidal Thoughts:  No  Homicidal Thoughts:  No  Memory:  Immediate;   Fair Recent;   Fair Remote;   Fair  Judgement:  Fair  Insight:  Fair  Psychomotor Activity:  Normal  Concentration:  Concentration: Fair and Attention Span: Fair  Recall:  [employer] of Knowledge:  Fair  Language:  Fair  Akathisia:  No  Handed:    AIMS (if indicated):     Assets:  Communication Skills Desire for Improvement Physical Health Resilience Social Support  ADL's:  Intact  Cognition:  WNL  Sleep:      Treatment Plan Summary: Schizoaffective disorder, bipolar type ([place]), unstable, warrants inpatient admission for stabilization.  Medications: -Celexa 67m po daily -Vistaril 212mpo q6h prn anxiety  Disposition: Inpatient psychiatric admission for stabilization  WiBenjamine MolaFNP 01/01/2016 1:45 PM

## 2016-02-01 ENCOUNTER — Encounter (HOSPITAL_BASED_OUTPATIENT_CLINIC_OR_DEPARTMENT_OTHER): Payer: Self-pay | Admitting: Emergency Medicine

## 2016-02-01 ENCOUNTER — Emergency Department (HOSPITAL_BASED_OUTPATIENT_CLINIC_OR_DEPARTMENT_OTHER)
Admission: EM | Admit: 2016-02-01 | Discharge: 2016-02-01 | Disposition: A | Discharge status: Home / Self Care | Payer: Medicaid Other | Attending: Emergency Medicine | Admitting: Emergency Medicine

## 2016-02-01 DIAGNOSIS — K0889 Other specified disorders of teeth and supporting structures: Secondary | ICD-10-CM | POA: Diagnosis not present

## 2016-02-01 DIAGNOSIS — Z79899 Other long term (current) drug therapy: Secondary | ICD-10-CM | POA: Insufficient documentation

## 2016-02-01 DIAGNOSIS — J45909 Unspecified asthma, uncomplicated: Secondary | ICD-10-CM | POA: Insufficient documentation

## 2016-02-01 DIAGNOSIS — F319 Bipolar disorder, unspecified: Secondary | ICD-10-CM | POA: Insufficient documentation

## 2016-02-01 MED ORDER — HYDROCODONE-ACETAMINOPHEN 5-325 MG PO TABS: 1.0000 | ORAL_TABLET | Freq: Four times a day (QID) | ORAL | 0 refills | Status: DC | PRN

## 2016-02-01 MED ORDER — PENICILLIN V POTASSIUM 500 MG PO TABS: 500.0000 mg | ORAL_TABLET | Freq: Four times a day (QID) | ORAL | 0 refills | Status: AC

## 2016-02-01 NOTE — Discharge Instructions (Signed)
Medications: Penicillin  Treatment: Take penicillin as prescribed for 1 week. Take 1-2 Norco every 6 hours as needed for severe pain. Do not drive or operate machinery when taking this medication and only take as prescribed. Take ibuprofen over the counter for mild-moderate pain. Use warm salt water rinses 3-4 daily.  Follow-up: Please follow up with a dentist as soon as possible for further evaluation and treatment of your dental pain. Please return to the emergency department if you develop any new or worsening symptoms.

## 2016-02-01 NOTE — ED Triage Notes (Signed)
Dental pain to R lower back tooth. Pt reports tooth is cracked.

## 2016-02-01 NOTE — ED Provider Notes (Signed)
[facility] DEPT [facility] Provider Note  CSN: [id number] Arrival date & time: 02/01/16  1451  By signing my name below, I, Irene Pap, attest that this documentation has been prepared under the direction and in the presence of [employer], PA-C. Electronically Signed: Irene Pap, ED Scribe. 02/01/16. 5:13 PM.   First Provider Contact:  None    History   Chief Complaint Chief Complaint  Patient presents with  . Dental Pain    The history is provided by the patient. No language interpreter was used.  HPI Comments: Colleen Peck is a 35 y.o. female with a hx of thyroid disease who presents to the Emergency Department complaining of gradually worsening, shooting right lower dental pain that radiates to her right neck onset 2 weeks ago. Pt reports that she has a tooth cracked in the area; it has been broken for ~2 years. She has been having less severe pain than today intermittently since the tooth broke. She reports worsening pain with eating. She has taken 2 hydrocodone (of a friend's) and 3 Tylenols for pain yesterday to no relief. She reports mild relief with heating pads. She denies fever, chills, drooling, trismus, chest pain, SOB, nausea or vomiting. She denies allergies to antibiotics.   Past Medical History:  Diagnosis Date  . Abnormal pap    pt reports abnl pap many years ago.  Nl since then.  . Asthma   . Bipolar disorder ([place])   . Depression   . Palpitations 03/12/2008  . PTSD (post-traumatic stress disorder)   . Seasonal allergies   . Thyroid disease 2009   Graves disease (pt reported resolved); hypothyriodism    Patient Active Problem List   Diagnosis Date Noted  . History of borderline personality disorder 01/01/2016  . Carotidynia 12/16/2015  . Schizoaffective disorder, bipolar type ([place]) 11/26/2015  . Anxiety   . Pain of left calf 11/10/2015  . PTSD (post-traumatic stress disorder) 09/30/2015  . Medical clearance for psychiatric admission   . Paranoia  ([place])   . Anemia 01/30/2015  . Unspecified vitamin D deficiency 05/22/2013  . Hypothyroidism (acquired) 11/29/2011  . Obesity 09/08/2006  . RHINITIS, ALLERGIC 09/08/2006  . Asthma 09/08/2006  . ECZEMA, ATOPIC DERMATITIS 09/08/2006    Past Surgical History:  Procedure Laterality Date  . DILATION AND CURETTAGE OF UTERUS  March 2006    OB History    No data available       Home Medications    Prior to Admission medications   Medication Sig Start Date End Date Taking? Authorizing Provider  albuterol (PROVENTIL HFA;VENTOLIN HFA) 108 (90 Base) MCG/ACT inhaler Inhale 2 puffs into the lungs every 6 (six) hours as needed for wheezing or shortness of breath. 01/13/16  Yes Encarnacion Slates, NP  FLUoxetine (PROZAC) 20 MG capsule Take 1 capsule (20 mg total) by mouth daily. For depression 01/13/16  Yes Encarnacion Slates, NP  fluticasone (FLOVENT HFA) 44 MCG/ACT inhaler Inhale 2 puffs into the lungs 2 (two) times daily. For Asthma 01/13/16  Yes Encarnacion Slates, NP  hydrOXYzine (ATARAX/VISTARIL) 25 MG tablet Take 1 tablet (25 mg total) by mouth every 6 (six) hours as needed for anxiety or itching. 01/13/16  Yes Encarnacion Slates, NP  lamoTRIgine (LAMICTAL) 25 MG tablet Take 1 tablet (25 mg) in the morning & 2 tablets (50 mg) in the evening: For mood stabilization 01/13/16  Yes Encarnacion Slates, NP  levothyroxine (SYNTHROID, LEVOTHROID) 112 MCG tablet Take 2 tablets (224 mcg total) by mouth daily  before breakfast. For low thyroid hormone 01/13/16  Yes Encarnacion Slates, NP  phenylephrine-shark liver oil-mineral oil-petrolatum (PREPARATION H) 0.25-3-14-71.9 % rectal ointment Place rectally 2 (two) times daily as needed for hemorrhoids. 01/13/16  Yes Encarnacion Slates, NP  QUEtiapine (SEROQUEL) 50 MG tablet Take 1 tablet (50 mg total) by mouth 3 (three) times daily as needed (severe anxiety/agitation). 01/13/16  Yes Encarnacion Slates, NP  docusate sodium (COLACE) 100 MG capsule Take 1 capsule (100 mg total) by mouth 2 (two) times daily. For  constipation 01/13/16   Encarnacion Slates, NP  HYDROcodone-acetaminophen (NORCO/VICODIN) 5-325 MG tablet Take 1-2 tablets by mouth every 6 (six) hours as needed. 02/01/16   Frederica Kuster, PA-C  penicillin v potassium (VEETID) 500 MG tablet Take 1 tablet (500 mg total) by mouth 4 (four) times daily. 02/01/16 02/08/16  Bea Graff Florine Sprenkle, PA-C  polyethylene glycol powder (GLYCOLAX/MIRALAX) powder Take 17 g by mouth 2 (two) times daily as needed. For constipation 01/13/16   Encarnacion Slates, NP  QUEtiapine (SEROQUEL) 50 MG tablet Take 5 tablets (250 mg total) by mouth daily at 8 pm. For mood control 01/13/16   Encarnacion Slates, NP    Family History Family History  Problem Relation Age of Onset  . Drug abuse Father   . Depression Maternal Grandmother   . Anxiety disorder Maternal Grandmother   . COPD Maternal Grandmother   . Suicidality Cousin   . Depression Cousin   . Bipolar disorder Cousin   . Hypertension Mother   . Depression Mother   . Diabetes Paternal Grandfather   . COPD Paternal Grandmother   . Depression Maternal Aunt   . Depression Maternal Aunt   . Heart disease Neg Hx     Social History Social History  Substance Use Topics  . Smoking status: Never Smoker  . Smokeless tobacco: Never Used  . Alcohol use No     Allergies   Abilify [aripiprazole]   Review of Systems Review of Systems  Constitutional: Negative for chills and fever.  HENT: Positive for dental problem. Negative for facial swelling and sore throat.   Respiratory: Negative for shortness of breath.   Cardiovascular: Negative for chest pain.  Gastrointestinal: Negative for abdominal pain, nausea and vomiting.  Genitourinary: Negative for dysuria.  Musculoskeletal: Negative for back pain.  Skin: Negative for rash and wound.  Neurological: Negative for headaches.  Psychiatric/Behavioral: The patient is not nervous/anxious.      Physical Exam Updated Vital Signs BP 117/74 (BP Location: Right Arm)   Pulse 75   Temp  98.6 F (37 C) (Oral)   Resp 18   Ht 5\' 6"  (1.676 m)   Wt 102.1 kg   LMP 01/22/2016   SpO2 99%   BMI 36.32 kg/m   Physical Exam  Constitutional: She appears well-developed and well-nourished. No distress.  HENT:  Head: Normocephalic and atraumatic.  Mouth/Throat: Uvula is midline, oropharynx is clear and moist and mucous membranes are normal. No trismus in the jaw. Abnormal dentition. Dental caries present. No dental abscesses or uvula swelling. No oropharyngeal exudate, posterior oropharyngeal edema, posterior oropharyngeal erythema or tonsillar abscesses.       Eyes: Conjunctivae are normal. Pupils are equal, round, and reactive to light. Right eye exhibits no discharge. Left eye exhibits no discharge. No scleral icterus.  Neck: Normal range of motion. Neck supple. No thyromegaly present.  Cardiovascular: Normal rate, regular rhythm and normal heart sounds.  Exam reveals no gallop and no friction rub.  No murmur heard. Pulmonary/Chest: Effort normal and breath sounds normal. No stridor. No respiratory distress. She has no wheezes. She has no rales.  Abdominal: Soft. Bowel sounds are normal. She exhibits no distension. There is no tenderness. There is no rebound and no guarding.  Musculoskeletal: She exhibits no edema.  Lymphadenopathy:    She has no cervical adenopathy.  Neurological: She is alert. Coordination normal.  Skin: Skin is warm and dry. No rash noted. She is not diaphoretic. No pallor.  Psychiatric: She has a normal mood and affect.  Nursing note and vitals reviewed.    ED Treatments / Results  DIAGNOSTIC STUDIES: Oxygen Saturation is 99% on RA, normal by my interpretation.    COORDINATION OF CARE: 5:07 PM-Discussed treatment plan which includes anti-biotics and follow up with dentist with pt at bedside and pt agreed to plan.    Labs (all labs ordered are listed, but only abnormal results are displayed) Labs Reviewed - No data to display  EKG  EKG  Interpretation None       Radiology No results found.  Procedures Procedures (including critical care time)  Medications Ordered in ED Medications - No data to display   Initial Impression / Assessment and Plan / ED Course  I have reviewed the triage vital signs and the nursing notes.  Pertinent labs & imaging results that were available during my care of the patient were reviewed by me and considered in my medical decision making (see chart for details).  Clinical Course     Final Clinical Impressions(s) / ED Diagnoses   Final diagnoses:  Pain, dental   Patient with dentalgia around chronically fractured tooth.  No gross abscess.  Exam unconcerning for Ludwig's angina or spread of infection.  Will treat with penicillin and short course of pain medicine.  Urged patient to follow-up with dentist.  Patient understands and agrees with plan. Patient vitals stable throughout ED course and discharged in satisfactory condition.  I personally performed the services described in this documentation, which was scribed in my presence. The recorded information has been reviewed and is accurate.  New Prescriptions Discharge Medication List as of 02/01/2016  5:19 PM    START taking these medications   Details  HYDROcodone-acetaminophen (NORCO/VICODIN) 5-325 MG tablet Take 1-2 tablets by mouth every 6 (six) hours as needed., Starting Sun 02/01/2016, Print    penicillin v potassium (VEETID) 500 MG tablet Take 1 tablet (500 mg total) by mouth 4 (four) times daily., Starting Sun 02/01/2016, Until Sun 02/08/2016, Print         Frederica Kuster, PA-C 02/02/16 [place], MD 02/02/16 [phone]

## 2016-04-28 ENCOUNTER — Telehealth: Payer: Self-pay | Admitting: Family Medicine

## 2016-04-28 ENCOUNTER — Encounter (HOSPITAL_COMMUNITY): Payer: Self-pay | Admitting: Emergency Medicine

## 2016-04-28 DIAGNOSIS — R42 Dizziness and giddiness: Secondary | ICD-10-CM | POA: Insufficient documentation

## 2016-04-28 DIAGNOSIS — E039 Hypothyroidism, unspecified: Secondary | ICD-10-CM | POA: Diagnosis not present

## 2016-04-28 DIAGNOSIS — J45909 Unspecified asthma, uncomplicated: Secondary | ICD-10-CM | POA: Insufficient documentation

## 2016-04-28 NOTE — ED Triage Notes (Signed)
[place] [facility] [place] [employer] [facility] [place] she felt as though she could not get her thoughts, felt sick, felt dizzy, and light headed. Pt states she had those same symptoms again today and when she called her doctor, the nurse told her to get to the ED immediately. Pt drove herself to the ED.

## 2016-04-28 NOTE — Telephone Encounter (Signed)
Return call to patient regarding dizziness, confusion and lightheadedness.  Patient stated she does not feel well.  She was having confusion and dizziness.  First time this has happen to her.  Patient also reported right side pain. Patient denies headache, falling hitting her head, n/v, burning with urination, change in medications. Advised patient that she should go to ED/urgent care tonight, patient refused.  Patient scheduled an appointment with same day provider tomorrow at 4:15 PM.  Advised patient again if it gets worse she should be seen in ED or Urgent care.  Will forward to PCP for further advise.  Derl Barrow, RN

## 2016-04-28 NOTE — Telephone Encounter (Signed)
Pt is experiencing dizziness, is lightheaded and states is having a hard time trying to think. Pt also states she can't clear thoughts. I advised pt to make appointment, pt declined. Please advise. Thanks! ep

## 2016-04-29 ENCOUNTER — Ambulatory Visit: Payer: Self-pay | Admitting: Student

## 2016-04-29 ENCOUNTER — Emergency Department (HOSPITAL_COMMUNITY)
Admission: EM | Admit: 2016-04-29 | Discharge: 2016-04-29 | Disposition: A | Discharge status: Home / Self Care | Payer: Medicaid Other | Attending: Emergency Medicine | Admitting: Emergency Medicine

## 2016-04-29 DIAGNOSIS — R42 Dizziness and giddiness: Secondary | ICD-10-CM

## 2016-04-29 LAB — BASIC METABOLIC PANEL
ANION GAP: 6 (ref 5–15)
Anion gap: 6 (ref 5–15)
BUN: 11 mg/dL (ref 6–20)
BUN: 8 mg/dL (ref 6–20)
CALCIUM: 9.4 mg/dL (ref 8.9–10.3)
CALCIUM: 9.3 mg/dL (ref 8.9–10.3)
CHLORIDE: 106 mmol/L (ref 101–111)
CO2: 25 mmol/L (ref 22–32)
CO2: 27 mmol/L (ref 22–32)
CREATININE: 0.87 mg/dL (ref 0.44–1.00)
Chloride: 102 mmol/L (ref 101–111)
Creatinine, Ser: 0.9 mg/dL (ref 0.44–1.00)
GFR calc Af Amer: >60 mL/min (ref >60)
GFR calc non Af Amer: >60 mL/min (ref >60)
GFR calc non Af Amer: >60 mL/min (ref >60)
GLUCOSE: 86 mg/dL (ref 65–99)
Glucose, Bld: 95 mg/dL (ref 65–99)
POTASSIUM: 3.7 mmol/L (ref 3.5–5.1)
Potassium: 6.2 mmol/L — ABNORMAL HIGH (ref 3.5–5.1)
Sodium: 133 mmol/L — ABNORMAL LOW (ref 135–145)
Sodium: 139 mmol/L (ref 135–145)

## 2016-04-29 LAB — CBC WITH DIFFERENTIAL/PLATELET
Basophils Absolute: 0 10*3/uL (ref 0.0–0.1)
Basophils Relative: 0 %
EOS PCT: 4 %
Eosinophils Absolute: 0.2 10*3/uL (ref 0.0–0.7)
HEMATOCRIT: 35.7 % — AB (ref 36.0–46.0)
Hemoglobin: 11.5 g/dL — ABNORMAL LOW (ref 12.0–15.0)
LYMPHS PCT: 37 %
Lymphs Abs: 2.4 10*3/uL (ref 0.7–4.0)
MCH: 26.5 pg (ref 26.0–34.0)
MCHC: 32.2 g/dL (ref 30.0–36.0)
MCV: 82.3 fL (ref 78.0–100.0)
MONO ABS: 0.4 10*3/uL (ref 0.1–1.0)
MONOS PCT: 7 %
NEUTROS ABS: 3.4 10*3/uL (ref 1.7–7.7)
Neutrophils Relative %: 52 %
Platelets: 289 10*3/uL (ref 150–400)
RBC: 4.34 MIL/uL (ref 3.87–5.11)
RDW: 14.2 % (ref 11.5–15.5)
WBC: 6.5 10*3/uL (ref 4.0–10.5)

## 2016-04-29 LAB — I-STAT BETA HCG BLOOD, ED (MC, WL, AP ONLY): I-stat hCG, quantitative: <5 m[IU]/mL (ref <5)

## 2016-04-29 LAB — TSH: TSH: 14.796 u[IU]/mL — ABNORMAL HIGH (ref 0.350–4.500)

## 2016-04-29 LAB — T4, FREE: Free T4: 0.64 ng/dL (ref 0.61–1.12)

## 2016-04-29 MED ORDER — SODIUM CHLORIDE 0.9 % IV BOLUS (SEPSIS)
1000.0000 mL | Freq: Once | INTRAVENOUS | Status: AC
  Administered 2016-04-29: 1000 mL via INTRAVENOUS

## 2016-04-29 MED ORDER — METOCLOPRAMIDE HCL 10 MG PO TABS
10.0000 mg | ORAL_TABLET | Freq: Once | ORAL | Status: AC
  Administered 2016-04-29: 10 mg via ORAL
  Filled 2016-04-29: qty 1

## 2016-04-29 NOTE — ED Provider Notes (Signed)
[facility] DEPT Provider Note   CSN: [id number] Arrival date & time: 04/28/16  1842  By signing my name below, I, Evelene Croon, attest that this documentation has been prepared under the direction and in the presence of Everlene Balls, MD . Electronically Signed: Evelene Croon, Scribe. 04/29/2016. 1:17 AM.  History   Chief Complaint Chief Complaint  Patient presents with  . Dizziness    left sided head pain   The history is provided by the patient. No language interpreter was used.     HPI Comments:  Colleen Peck is a 35 y.o. female who presents to the Emergency Department complaining of dizziness since ~ 1100 AM yesterday, She reports associated left sided facial numbness, mild left sided HA and nausea; states the nausea has resolved at this time. Pt states she went to a restaurant that had fumes and has been having these symptoms since. She denies h/o frequent HAs, however, notes she had am isolated HA ~ 2-3 weeks ago. Pt denies numbness/weakness to her BUE and BLE and vomiting. No alleviating factors noted; no treatments tried PTA. No recent change in daily meds. No change in sleep patterns.   Past Medical History:  Diagnosis Date  . Abnormal pap    pt reports abnl pap many years ago.  Nl since then.  . Asthma   . Bipolar disorder ([place])   . Depression   . Palpitations 03/12/2008  . PTSD (post-traumatic stress disorder)   . Seasonal allergies   . Thyroid disease 2009   Graves disease (pt reported resolved); hypothyriodism    Patient Active Problem List   Diagnosis Date Noted  . History of borderline personality disorder 01/01/2016  . Carotidynia 12/16/2015  . Schizoaffective disorder, bipolar type ([place]) 11/26/2015  . Anxiety   . Pain of left calf 11/10/2015  . PTSD (post-traumatic stress disorder) 09/30/2015  . Medical clearance for psychiatric admission   . Paranoia ([place])   . Anemia 01/30/2015  . Unspecified vitamin D deficiency 05/22/2013  . Hypothyroidism  (acquired) 11/29/2011  . Obesity 09/08/2006  . RHINITIS, ALLERGIC 09/08/2006  . Asthma 09/08/2006  . ECZEMA, ATOPIC DERMATITIS 09/08/2006    Past Surgical History:  Procedure Laterality Date  . DILATION AND CURETTAGE OF UTERUS  March 2006    OB History    No data available       Home Medications    Prior to Admission medications   Medication Sig Start Date End Date Taking? Authorizing Provider  albuterol (PROVENTIL HFA;VENTOLIN HFA) 108 (90 Base) MCG/ACT inhaler Inhale 2 puffs into the lungs every 6 (six) hours as needed for wheezing or shortness of breath. 01/13/16  Yes Encarnacion Slates, NP  beclomethasone (QVAR) 80 MCG/ACT inhaler Inhale 1 puff into the lungs 2 (two) times daily.   Yes Historical Provider, MD  hydrOXYzine (ATARAX/VISTARIL) 25 MG tablet Take 1 tablet (25 mg total) by mouth every 6 (six) hours as needed for anxiety or itching. 01/13/16  Yes Encarnacion Slates, NP  ibuprofen (ADVIL,MOTRIN) 600 MG tablet Take 600 mg by mouth every 6 (six) hours as needed for moderate pain.   Yes Historical Provider, MD  lamoTRIgine (LAMICTAL) 25 MG tablet Take 1 tablet (25 mg) in the morning & 2 tablets (50 mg) in the evening: For mood stabilization 01/13/16  Yes Encarnacion Slates, NP  levothyroxine (SYNTHROID, LEVOTHROID) 112 MCG tablet Take 2 tablets (224 mcg total) by mouth daily before breakfast. For low thyroid hormone 01/13/16  Yes Encarnacion Slates, NP  QUEtiapine (  SEROQUEL) 50 MG tablet Take 1 tablet (50 mg total) by mouth 3 (three) times daily as needed (severe anxiety/agitation). 01/13/16  Yes Encarnacion Slates, NP  docusate sodium (COLACE) 100 MG capsule Take 1 capsule (100 mg total) by mouth 2 (two) times daily. For constipation Patient not taking: Reported on 04/28/2016 01/13/16   Encarnacion Slates, NP  FLUoxetine (PROZAC) 20 MG capsule Take 1 capsule (20 mg total) by mouth daily. For depression Patient not taking: Reported on 04/28/2016 01/13/16   Encarnacion Slates, NP  fluticasone (FLOVENT HFA) 44 MCG/ACT  inhaler Inhale 2 puffs into the lungs 2 (two) times daily. For Asthma Patient not taking: Reported on 04/28/2016 01/13/16   Encarnacion Slates, NP  HYDROcodone-acetaminophen (NORCO/VICODIN) 5-325 MG tablet Take 1-2 tablets by mouth every 6 (six) hours as needed. Patient not taking: Reported on 04/28/2016 02/01/16   Frederica Kuster, PA-C  phenylephrine-shark liver oil-mineral oil-petrolatum (PREPARATION H) 0.25-3-14-71.9 % rectal ointment Place rectally 2 (two) times daily as needed for hemorrhoids. Patient not taking: Reported on 04/28/2016 01/13/16   Encarnacion Slates, NP  polyethylene glycol powder (GLYCOLAX/MIRALAX) powder Take 17 g by mouth 2 (two) times daily as needed. For constipation Patient not taking: Reported on 04/28/2016 01/13/16   Encarnacion Slates, NP  QUEtiapine (SEROQUEL) 50 MG tablet Take 5 tablets (250 mg total) by mouth daily at 8 pm. For mood control Patient not taking: Reported on 04/28/2016 01/13/16   Encarnacion Slates, NP    Family History Family History  Problem Relation Age of Onset  . Drug abuse Father   . Depression Maternal Grandmother   . Anxiety disorder Maternal Grandmother   . COPD Maternal Grandmother   . Suicidality Cousin   . Depression Cousin   . Bipolar disorder Cousin   . Hypertension Mother   . Depression Mother   . Diabetes Paternal Grandfather   . COPD Paternal Grandmother   . Depression Maternal Aunt   . Depression Maternal Aunt   . Heart disease Neg Hx     Social History Social History  Substance Use Topics  . Smoking status: Never Smoker  . Smokeless tobacco: Never Used  . Alcohol use No     Allergies   Abilify [aripiprazole]   Review of Systems Review of Systems 10 systems reviewed and all are negative for acute change except as noted in the HPI.   Physical Exam Updated Vital Signs BP 108/77 (BP Location: Left Arm)   Pulse 77   Temp 98.9 F (37.2 C) (Oral)   Resp 13   Ht 5\' 7"  (1.702 m)   Wt 260 lb (117.9 kg)   LMP 04/21/2016 (Exact Date)    SpO2 100%   BMI 40.72 kg/m   Physical Exam  Constitutional: She is oriented to person, place, and time. She appears well-developed and well-nourished. No distress.  HENT:  Head: Normocephalic and atraumatic.  Nose: Nose normal.  Mouth/Throat: Oropharynx is clear and moist. No oropharyngeal exudate.  Eyes: Conjunctivae and EOM are normal. Pupils are equal, round, and reactive to light. No scleral icterus.  Neck: Normal range of motion. Neck supple. No JVD present. No tracheal deviation present. No thyromegaly present.  Cardiovascular: Normal rate, regular rhythm and normal heart sounds.  Exam reveals no gallop and no friction rub.   No murmur heard. Pulmonary/Chest: Effort normal and breath sounds normal. No respiratory distress. She has no wheezes. She exhibits no tenderness.  Abdominal: Soft. Bowel sounds are normal. She exhibits no distension  and no mass. There is no tenderness. There is no rebound and no guarding.  Musculoskeletal: Normal range of motion. She exhibits no edema or tenderness.  Lymphadenopathy:    She has no cervical adenopathy.  Neurological: She is alert and oriented to person, place, and time. No cranial nerve deficit. She exhibits normal muscle tone.  Normal strength and sensation to all extremities Normal cerebellar testing  Skin: Skin is warm and dry. No rash noted. No erythema. No pallor.  Nursing note and vitals reviewed.    ED Treatments / Results  DIAGNOSTIC STUDIES:  Oxygen Saturation is 99% on RA, normal by my interpretation.    COORDINATION OF CARE:  12:58 AM Discussed treatment plan with pt at bedside and pt agreed to plan.  Labs (all labs ordered are listed, but only abnormal results are displayed) Labs Reviewed  CBC WITH DIFFERENTIAL/PLATELET - Abnormal; Notable for the following:       Result Value   Hemoglobin 11.5 (*)    HCT 35.7 (*)    All other components within normal limits  BASIC METABOLIC PANEL - Abnormal; Notable for the  following:    Sodium 133 (*)    Potassium 6.2 (*)    All other components within normal limits  TSH - Abnormal; Notable for the following:    TSH 14.796 (*)    All other components within normal limits  T4, FREE  BASIC METABOLIC PANEL  I-STAT BETA HCG BLOOD, ED ([facility], [facility], [facility] ONLY)    EKG  EKG Interpretation  Date/Time:  Thursday April 29 2016 01:12:10 EDT Ventricular Rate:  77 PR Interval:    QRS Duration: 90 QT Interval:  377 QTC Calculation: 427 R Axis:   81 Text Interpretation:  Sinus rhythm Baseline wander in lead(s) V1 V3 No significant change since last tracing Confirmed by Glynn Octave [id number]) on 04/29/2016 1:20:52 AM       Radiology No results found.  Procedures Procedures (including critical care time)  Medications Ordered in ED Medications  metoCLOPramide (REGLAN) tablet 10 mg (10 mg Oral Given 04/29/16 0137)  sodium chloride 0.9 % bolus 1,000 mL (0 mLs Intravenous Stopped 04/29/16 0230)     Initial Impression / Assessment and Plan / ED Course  I have reviewed the triage vital signs and the nursing notes.  Pertinent labs & imaging results that were available during my care of the patient were reviewed by me and considered in my medical decision making (see chart for details).  Clinical Course    Patient presents to the ED for  Dizziness after inhaling some fumes. Her neuro exam is completely normal, she is requesting blood work to ensure everything is normal.  Will obtain labs and give IVF.  Also potassium was replaced.  Patient advised on Thyroid function and need for PCP fu.  Free levels are normal so no acute changes needed in the ED.  Final Clinical Impressions(s) / ED Diagnoses   Final diagnoses:  Dizziness    New Prescriptions New Prescriptions   No medications on file   I personally performed the services described in this documentation, which was scribed in my presence. The recorded information has been reviewed and is accurate.        Everlene Balls, MD 04/29/16 [phone]

## 2016-04-29 NOTE — ED Notes (Signed)
Pt showing NAD. RR even and unlabored. Voices no questions/concerns at time.

## 2016-05-17 ENCOUNTER — Ambulatory Visit (INDEPENDENT_AMBULATORY_CARE_PROVIDER_SITE_OTHER): Payer: Medicaid Other | Admitting: Family Medicine

## 2016-05-17 ENCOUNTER — Encounter: Payer: Self-pay | Admitting: Family Medicine

## 2016-05-17 VITALS — BP 123/83 | HR 85 | Temp 98.2°F | Wt 263.0 lb

## 2016-05-17 DIAGNOSIS — L819 Disorder of pigmentation, unspecified: Secondary | ICD-10-CM | POA: Diagnosis not present

## 2016-05-17 DIAGNOSIS — L089 Local infection of the skin and subcutaneous tissue, unspecified: Secondary | ICD-10-CM | POA: Diagnosis not present

## 2016-05-17 MED ORDER — CEPHALEXIN 500 MG PO CAPS: 500.0000 mg | ORAL_CAPSULE | Freq: Two times a day (BID) | ORAL | 0 refills | Status: AC

## 2016-05-17 NOTE — Patient Instructions (Signed)
Apply vitamin E oil or good moisturizer to the areas of hyperpigmentation along your abdomen.  This may take up to 6 months before the color returns to normal.  Continue the salt washes and the chamomile tea for you nose.  Take the antibiotic twice daily as directed for the next week.

## 2016-05-17 NOTE — Progress Notes (Signed)
    Subjective: CC: ?infected nose ring, EKG pads left abrasions  HPI: Colleen Peck is a 35 y.o. female presenting to clinic today for follow up. Concerns today include:  1. Infected nose ring Re-pierced nose about 3 weeks ago. She has been cleaning area with sea salt and warm chamomile tea bags.  She notes that left nostril was oozing thick yellow stuff.  She notes that this has improved since using the sea salt.  She notes increased bleeding from site.  She notes pain on in the inside of the nostril.  She reports a small bump at that site.  Did not have this issue when it was pierced the first time.  Denies fevers, chills.  She notes swelling that has improved.  She reports continued pruritis at piercing site.  2. Abrasions She reports that she had an EKG done in the ED for dizziness.  She reports that the EKG pads were left on for >24 hours.  She notes that the sticky stuff left behind red spots on her skin.  She denies pruritus.  She reports hyperpigmentation at skin sites.  Social History Reviewed. FamHx and MedHx reviewed.  Please see EMR. Health Maintenance: declines flu shot  ROS: Per HPI  Objective: Office vital signs reviewed. BP 123/83   Pulse 85   Temp 98.2 F (36.8 C)   Wt 263 lb (119.3 kg)   LMP 04/21/2016 (Exact Date)   BMI 41.19 kg/m   Physical Examination:  General: Awake, alert, obese, No acute distress HEENT: sclera white, left nostril with inflamed pustule on inner aspect, no active bleeding or exudate. Mildly TTP Cardio: regular rate Pulm: normal WOB on room air Skin: 2 circular areas of hyperpigmentation along lower aspect of left and right abdomen.  No skin break down. No TTP.  Assessment/ Plan: 35 y.o. female   1. Soft tissue infection.  Given area of irritation, will treat empirically with Keflex - Keflex 500mg  BID x7 days - Return precautions reviewed - Continue home remedies  2. Hyperpigmentation - Reassurance - May use Vitamin E oil or  good moisturizer applied to sites  Follow up prn.  Janora Norlander, DO PGY-3, [facility]

## 2016-05-21 ENCOUNTER — Other Ambulatory Visit: Payer: Self-pay | Admitting: Family Medicine

## 2016-05-21 MED ORDER — LEVOTHYROXINE SODIUM 112 MCG PO TABS: 224.0000 ug | ORAL_TABLET | Freq: Every day | ORAL | 3 refills | Status: DC

## 2016-05-21 NOTE — Telephone Encounter (Signed)
Need refill on thyroid medication.  Pharmacy is [facility] attn: [id number]

## 2016-07-10 ENCOUNTER — Other Ambulatory Visit: Payer: Self-pay | Admitting: Family Medicine

## 2016-07-14 ENCOUNTER — Other Ambulatory Visit: Payer: Self-pay | Admitting: Family Medicine

## 2016-07-14 DIAGNOSIS — E039 Hypothyroidism, unspecified: Secondary | ICD-10-CM

## 2016-07-14 NOTE — Telephone Encounter (Signed)
Please have patient come in for TSH check.  Lab is ordered.  Refill x1 month of synthroid sent.  Will extend once repeat TSH resulted

## 2016-07-15 NOTE — Telephone Encounter (Signed)
Spoke with patient and scheduled lab visit for 07/21/16. Nat Christen, CMA

## 2016-07-20 ENCOUNTER — Other Ambulatory Visit: Payer: Self-pay

## 2016-07-20 DIAGNOSIS — E039 Hypothyroidism, unspecified: Secondary | ICD-10-CM

## 2016-07-20 LAB — TSH: TSH: 9.84 mIU/L — ABNORMAL HIGH

## 2016-07-21 ENCOUNTER — Other Ambulatory Visit: Payer: Self-pay

## 2016-07-22 ENCOUNTER — Other Ambulatory Visit: Payer: Self-pay | Admitting: Family Medicine

## 2016-07-22 MED ORDER — LEVOTHYROXINE SODIUM 112 MCG PO TABS: 224.0000 ug | ORAL_TABLET | Freq: Every day | ORAL | 2 refills | Status: DC

## 2016-07-22 NOTE — Progress Notes (Signed)
Discussed TSH with patient.  She notes that she had been out of medication for over a week preceding lab draw.  She has been previously controlled on current dose of Synthroid.  Therefore, will plan to recheck in about 6-8 weeks.  She denies symptoms at this time.  She acknowledges return precautions. Will plan to see in 6-8 weeks for thyroid check.  Results for orders placed or performed in visit on 07/20/16 (from the past 72 hour(s))  TSH     Status: Abnormal   Collection Time: 07/20/16  8:57 AM  Result Value Ref Range   TSH 9.84 (H) mIU/L    Comment:   Reference Range   > or = 20 Years  0.40-4.50   Pregnancy Range First trimester  0.26-2.66 Second trimester 0.55-2.73 Third trimester  0.43-2.91        Dayson Aboud M. Lajuana Ripple, DO PGY-3, [facility]

## 2016-08-02 ENCOUNTER — Other Ambulatory Visit: Payer: Self-pay | Admitting: *Deleted

## 2016-08-02 MED ORDER — LEVOTHYROXINE SODIUM 112 MCG PO TABS: 224.0000 ug | ORAL_TABLET | Freq: Every day | ORAL | 0 refills | Status: DC

## 2016-08-29 ENCOUNTER — Telehealth: Payer: Self-pay | Admitting: Internal Medicine

## 2016-08-29 NOTE — Telephone Encounter (Signed)
**  After Hours/ Emergency Line Call*  Received a call to report that Colleen Peck  Reports she stepped on a rusted nail earlier today. She cleaned the area with alcohol and peroxide. She had "a little bleeding" initially which has stopped now. She still has pain in the area. She reports she received her Tetanus in 2002 but is also "somewhat sure" that she had it after 2002 as well. Per chart review, she has had at least 3 prior doses. Per UTD, she would still need a tetanus vaccine only (no immunoglobulin). Made a same day appointment for tomorrow with Dr. Jerline Pain.   Smiley Houseman, MD PGY-2, [place] [facility]

## 2016-08-30 ENCOUNTER — Ambulatory Visit (INDEPENDENT_AMBULATORY_CARE_PROVIDER_SITE_OTHER): Payer: Medicaid Other | Admitting: Family Medicine

## 2016-08-30 ENCOUNTER — Encounter: Payer: Self-pay | Admitting: Family Medicine

## 2016-08-30 VITALS — BP 112/80 | HR 76 | Temp 97.8°F | Wt 268.0 lb

## 2016-08-30 DIAGNOSIS — S91332A Puncture wound without foreign body, left foot, initial encounter: Secondary | ICD-10-CM | POA: Diagnosis present

## 2016-08-30 MED ORDER — LEVOFLOXACIN 500 MG PO TABS: 500.0000 mg | ORAL_TABLET | Freq: Every day | ORAL | 0 refills | Status: DC

## 2016-08-30 NOTE — Progress Notes (Signed)
    Subjective:  Colleen Peck is a 36 y.o. female who presents to the [facility] today with a chief complaint of foot injury.   HPI:  Left Foot Injury Yesterday, patient stepped on a rusty nail covered in dirt that penetrated through the sole of her foot and into her skin. Since then, she has had some pain. No fevers. No discharge. No redness. Last tetanus in 2014.   ROS: Per HPI  PMH: Smoking history reviewed.   Objective:  Physical Exam: BP 112/80   Pulse 76   Temp 97.8 F (36.6 C) (Oral)   Wt 268 lb (121.6 kg)   LMP 08/29/2016   SpO2 99%   BMI 41.97 kg/m   Gen: NAD, resting comfortably MSK: -L Foot: Two small superficial lacerations approximately 51mm in diameter along plantar aspect of foot without erythema, edema, or drainage.   Assessment/Plan:  Penetrating Foot Injury Wound appears to be mostly superficial without and signs of bacterial infection. Patient had her last tetanus in 2014- she does not need a booster today. Given that she has had elevated A1c in the past, will prophylactically treat with 3 days of levaquin to cover against psuedomonas (per UpToDate recommendations). Return precautions reviewed. Follow up as needed.   Algis Greenhouse. Jerline Pain, [place] Resident PGY-3 08/30/2016 11:54 AM

## 2016-08-30 NOTE — Patient Instructions (Signed)
You do not need a tetanus shot.  We will treat with 3 days of antibiotics.   Let us know if the pain worsens, or you have redness, or discharge.  Take care,  Dr Jerline Pain

## 2016-09-20 IMAGING — US US ABDOMEN LIMITED
1 series · 14 of 25 positions shown · non-contrast
Comparison: None in PACs

CLINICAL DATA: Six months of right upper quadrant discomfort

EXAM:
US ABDOMEN LIMITED - RIGHT UPPER QUADRANT

[Series 1: us abdomen limited · 0.26mm/px · 14 of 51 slices shown]
[im 1/51]
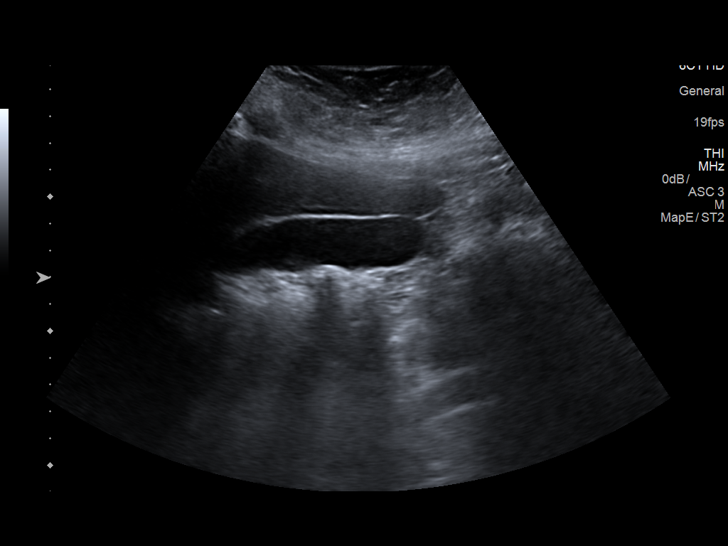
[im 5/51]
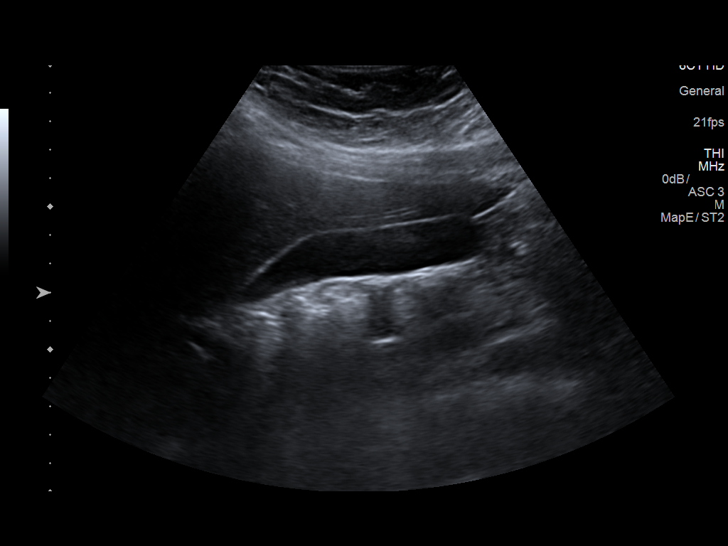
[im 9/51]
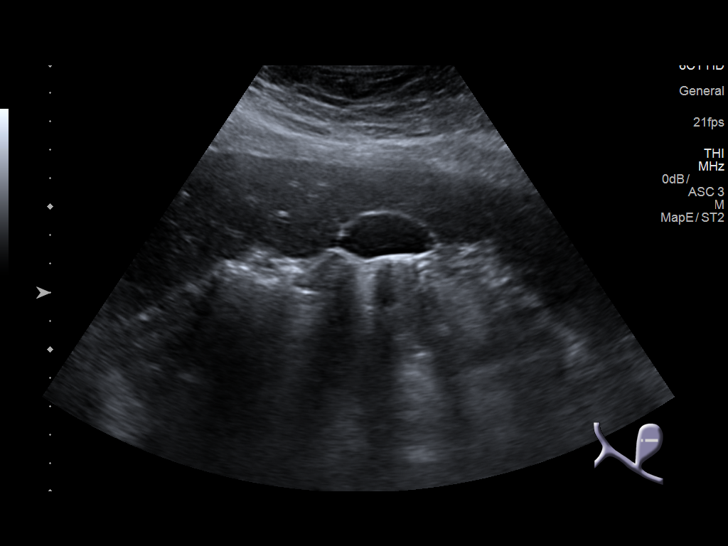
[im 13/51]
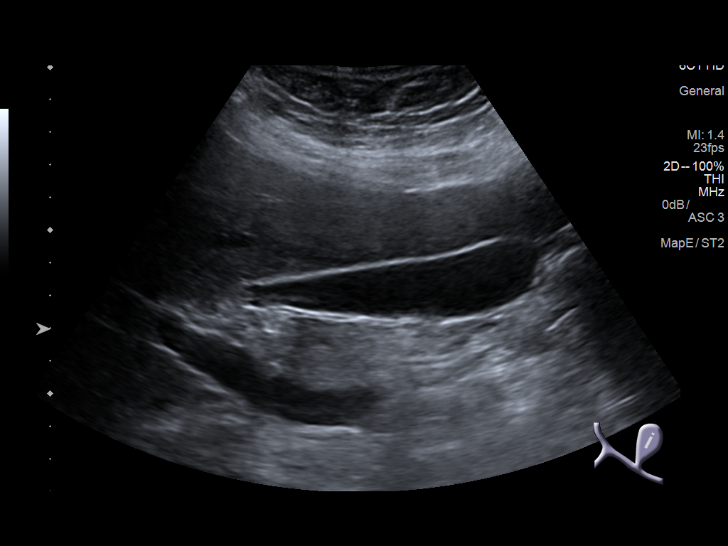
[im 17/51]
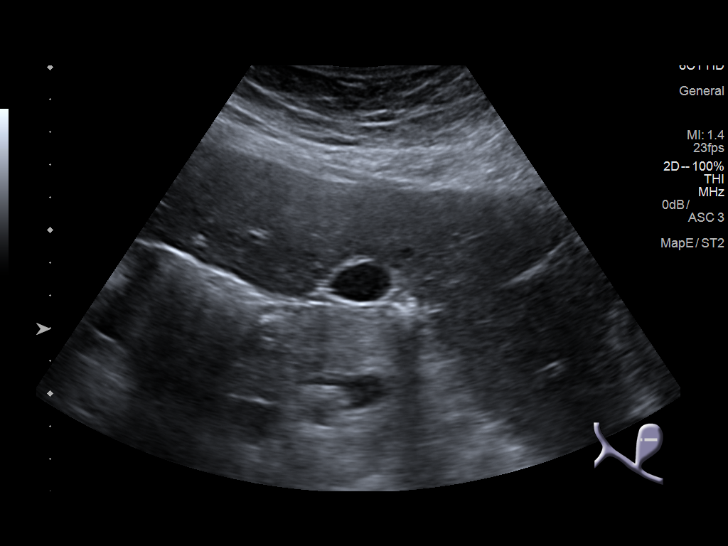
[im 19/51]
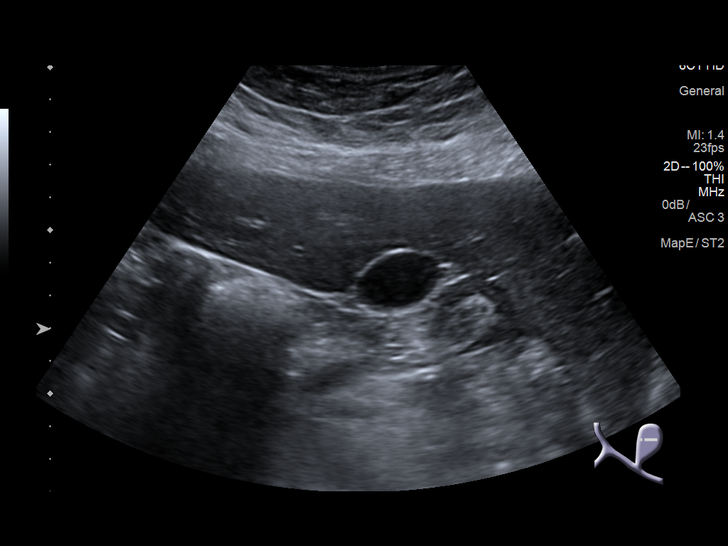
[im 23/51]
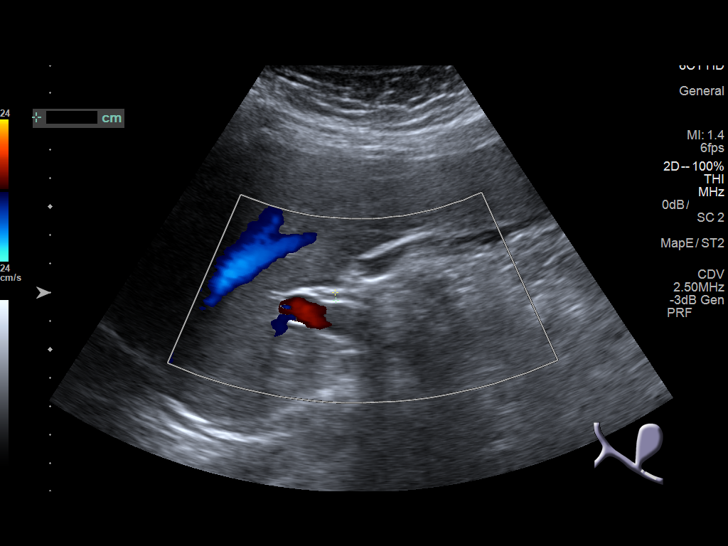
[im 28/51]
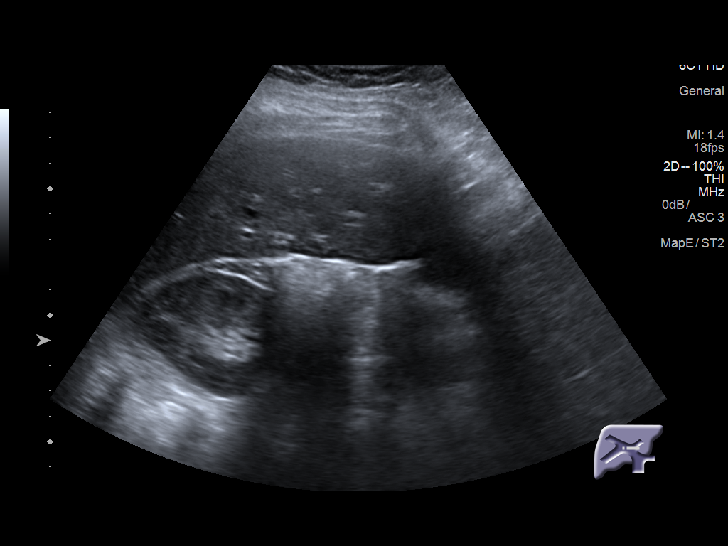
[im 32/51]
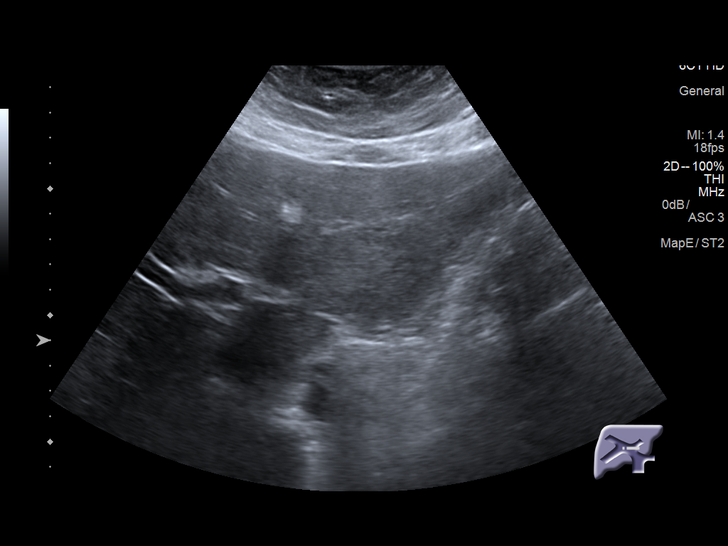
[im 34/51]
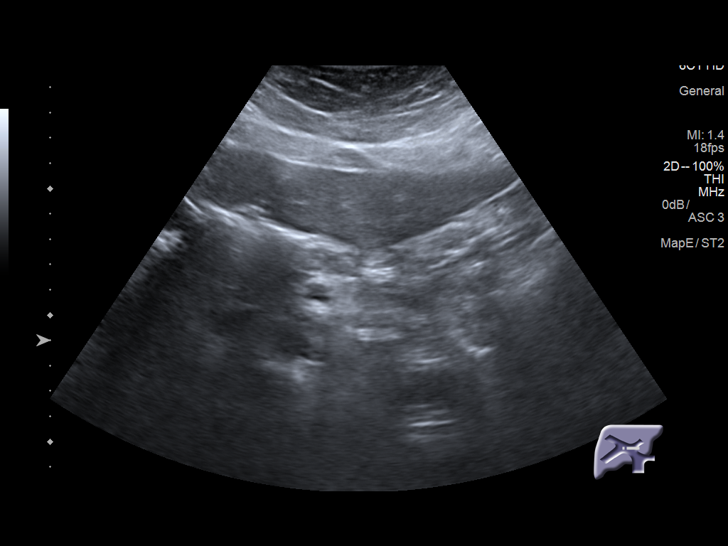
[im 38/51]
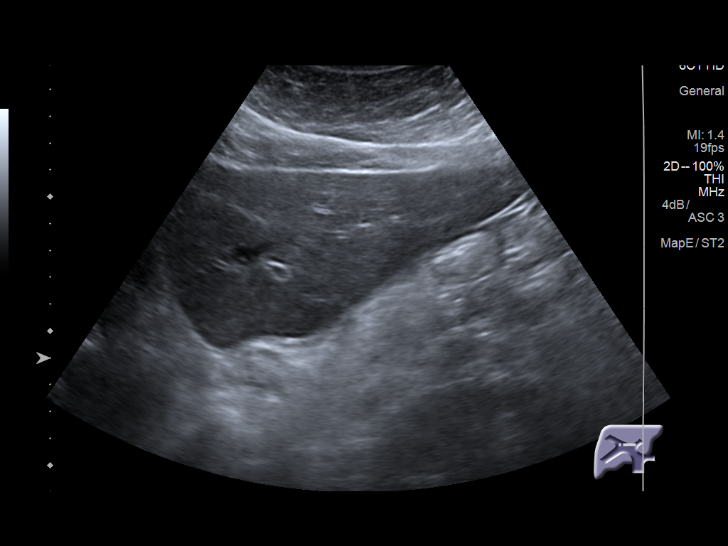
[im 42/51]
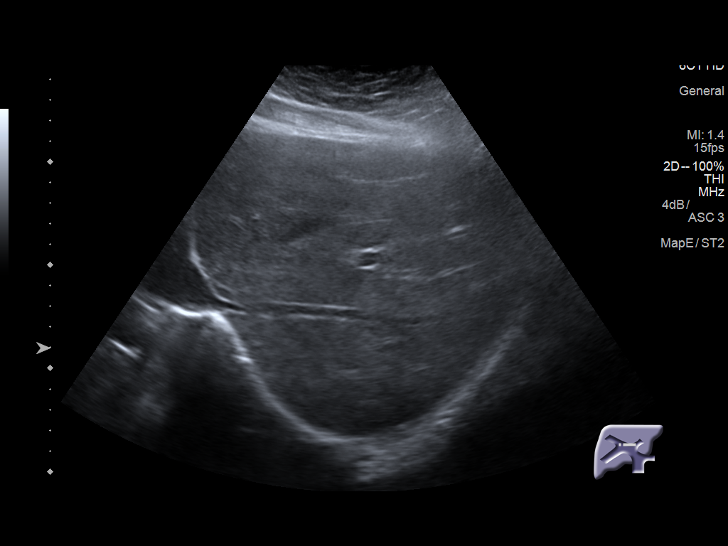
[im 46/51]
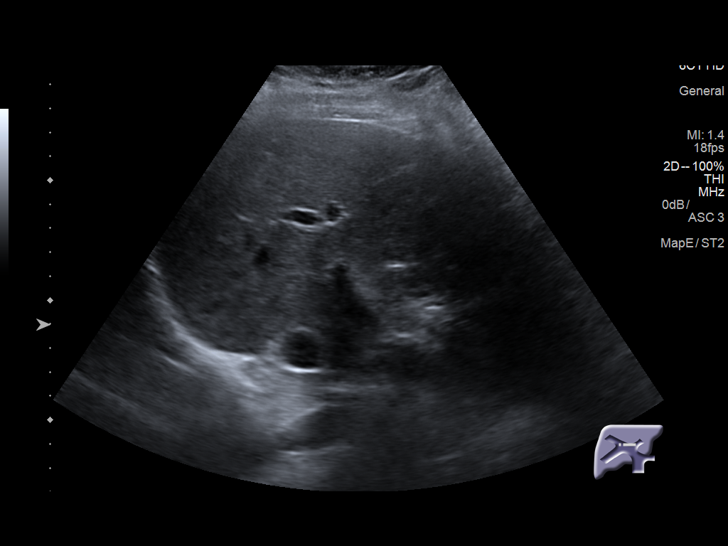
[im 51/51]
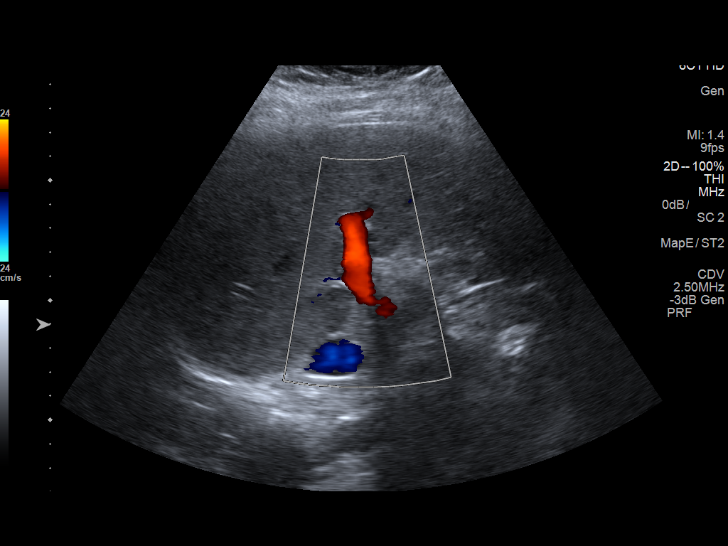

[14 of 25 positions shown; findings below may reference images not displayed]

FINDINGS: Gallbladder:

No gallstones or wall thickening visualized. No sonographic Murphy
sign noted by sonographer.

Common bile duct:

Diameter: 2.8 mm

Liver:

The hepatic echotexture is normal. There is no focal mass or ductal
dilation. The surface contour of the liver appears smooth.
IMPRESSION: Normal limited right upper quadrant abdominal ultrasound.

## 2016-11-30 ENCOUNTER — Ambulatory Visit (INDEPENDENT_AMBULATORY_CARE_PROVIDER_SITE_OTHER): Payer: Medicaid Other | Admitting: Family Medicine

## 2016-11-30 ENCOUNTER — Encounter: Payer: Self-pay | Admitting: Family Medicine

## 2016-11-30 ENCOUNTER — Other Ambulatory Visit (HOSPITAL_COMMUNITY)
Admission: RE | Admit: 2016-11-30 | Discharge: 2016-11-30 | Disposition: A | Discharge status: Home / Self Care | Payer: Medicaid Other | Source: Ambulatory Visit | Attending: Family Medicine | Admitting: Family Medicine

## 2016-11-30 VITALS — BP 118/79 | HR 78 | Temp 98.1°F | Wt 254.2 lb

## 2016-11-30 DIAGNOSIS — N898 Other specified noninflammatory disorders of vagina: Secondary | ICD-10-CM | POA: Insufficient documentation

## 2016-11-30 DIAGNOSIS — E039 Hypothyroidism, unspecified: Secondary | ICD-10-CM

## 2016-11-30 LAB — POCT WET PREP (WET MOUNT)
Clue Cells Wet Prep Whiff POC: NEGATIVE
TRICHOMONAS WET PREP HPF POC: ABSENT

## 2016-11-30 NOTE — Progress Notes (Signed)
   Subjective: CC: discharge WGN:FAOZHY Colleen Peck is a 36 y.o. female presenting to clinic today for same day appointment. PCP: Janora Norlander, DO Concerns today include:  1. Vaginal Discharge Patient reports that discharge started about 1 month ago around her menstrual cycles.  She notes that discharge appears blackish, greyish.  She denies vaginal odors.  She reports pain in her cervix when this happens.  She reports it feels similar to the pain she felt when she had a cervical biopsy done years ago.  She denies vaginal pruritis, abnormal vaginal bleeding, dysuria, hematuria, pelvic pain, nausea, vomiting, fevers.   No history of STIs.  She is not sexually active . Contraception: abstinence.  Patient's last menstrual period was 11/27/2016 (exact date).  2. Hypothyroidism Patient reports compliance with Synthroid 224 mcg.  Her last TSH was 9.84 in 07/2016.  She was supposed to follow up about 6 weeks following that lab result.  She reports constipation 1 week prior to menstrual cycles.  Endorses insomnia related to anxiety.  Denies diarrhea, hair thinning, voice changes, difficulty swallowing.  Allergies  Allergen Reactions  . Abilify [Aripiprazole] Other (See Comments)    AKATHISIA   Social Hx reviewed: non smoker. MedHx, current medications and allergies reviewed.  Please see EMR. ROS: Per HPI  Objective: Office vital signs reviewed. BP 118/79   Pulse 78   Temp 98.1 F (36.7 C) (Oral)   Wt 254 lb 3.2 oz (115.3 kg)   LMP 11/27/2016 (Exact Date)   SpO2 98%   BMI 39.81 kg/m   Physical Examination:  General: Awake, alert, obese, No acute distress HEENT: Normal    Neck: No masses palpated. No lymphadenopathy, thyroid not palpable. Cardio: regular rate and rhythm, +2 pedal pulses Pulm: normal work of breathing on room air GU: external vaginal tissue normal, cervix midline, no punctate lesions on cervix appreciated, discharge from cervical os difficult to see 2/2 bleeding, no  cervical motion tenderness, no abdominal/ adnexal masses Skin: normal temperature  Results for orders placed or performed in visit on 11/30/16 (from the past 24 hour(s))  POCT Wet Prep [facility])     Status: None   Collection Time: 11/30/16  2:15 PM  Result Value Ref Range   Source Wet Prep POC VAG    WBC, Wet Prep HPF POC NONE    Bacteria Wet Prep HPF POC Few Few   Clue Cells Wet Prep HPF POC None None   Clue Cells Wet Prep Whiff POC Negative Whiff    Yeast Wet Prep HPF POC None    Trichomonas Wet Prep HPF POC Absent Absent   Assessment/ Plan: 36 y.o. female   1. Hypothyroidism (acquired). - TSH  2. Vaginal discharge - POCT Wet Prep [facility]) - Cervicovaginal ancillary only - HIV antibody (with reflex)  Will contact with results.  Janora Norlander, DO PGY-3, [facility]

## 2016-11-30 NOTE — Patient Instructions (Signed)

## 2016-12-01 LAB — TSH: TSH: 1.64 u[IU]/mL (ref 0.450–4.500)

## 2016-12-01 LAB — CERVICOVAGINAL ANCILLARY ONLY
Chlamydia: NEGATIVE
Neisseria Gonorrhea: NEGATIVE
TRICH (WINDOWPATH): NEGATIVE

## 2016-12-01 LAB — HIV ANTIBODY (ROUTINE TESTING W REFLEX): HIV Screen 4th Generation wRfx: NONREACTIVE

## 2017-01-06 ENCOUNTER — Ambulatory Visit (INDEPENDENT_AMBULATORY_CARE_PROVIDER_SITE_OTHER): Payer: Medicaid Other | Admitting: Family Medicine

## 2017-01-06 ENCOUNTER — Encounter: Payer: Self-pay | Admitting: Family Medicine

## 2017-01-06 DIAGNOSIS — D2371 Other benign neoplasm of skin of right lower limb, including hip: Secondary | ICD-10-CM | POA: Diagnosis present

## 2017-01-06 NOTE — Patient Instructions (Signed)
Dermatofibroma of right leg - this is a benign, non-cancerous inflammatory reaction that will most likely continue to be present but is not dangerous.  You can moisturize your legs and avoid shaving that area and scratching it to reduce the chance of it increasing in size.  You can also apply hydrocortisone cream to reduce itching.  Boil on left breast - this is an infected hair follicle that has since gotten better and is not dangerous.  You can apply warm compresses to the area to reduce any swelling.  If it returns or gets bigger, you should come in to have it looked at again.  Rectal pain - continue to eat lots of fruits and vegetables and drink plenty of water to keep bowel movements soft.  You can also apply hemorrhoid cream and take epsom salt baths to reduce pain in the area.  If pain continues for more than six weeks, you should come in to have the area examined.  We will schedule a follow up appointment to discuss contraception choices for you.  None of these methods (other than tubal ligation) will decrease your chances of becoming pregnant after you stop them.   Contraception Choices Contraception (birth control) is the use of any methods or devices to prevent pregnancy. Below are some methods to help avoid pregnancy. Hormonal methods  Contraceptive implant. This is a thin, plastic tube containing progesterone hormone. It does not contain estrogen hormone. Your health care provider inserts the tube in the inner part of the upper arm. The tube can remain in place for up to 3 years. After 3 years, the implant must be removed. The implant prevents the ovaries from releasing an egg (ovulation), thickens the cervical mucus to prevent sperm from entering the uterus, and thins the lining of the inside of the uterus.  Progesterone-only injections. These injections are given every 3 months by your health care provider to prevent pregnancy. This synthetic progesterone hormone stops the ovaries from  releasing eggs. It also thickens cervical mucus and changes the uterine lining. This makes it harder for sperm to survive in the uterus.  Birth control pills. These pills contain estrogen and progesterone hormone. They work by preventing the ovaries from releasing eggs (ovulation). They also cause the cervical mucus to thicken, preventing the sperm from entering the uterus. Birth control pills are prescribed by a health care provider.Birth control pills can also be used to treat heavy periods.  Minipill. This type of birth control pill contains only the progesterone hormone. They are taken every day of each month and must be prescribed by your health care provider.  Birth control patch. The patch contains hormones similar to those in birth control pills. It must be changed once a week and is prescribed by a health care provider.  Vaginal ring. The ring contains hormones similar to those in birth control pills. It is left in the vagina for 3 weeks, removed for 1 week, and then a new one is put back in place. The patient must be comfortable inserting and removing the ring from the vagina.A health care provider's prescription is necessary.  Emergency contraception. Emergency contraceptives prevent pregnancy after unprotected sexual intercourse. This pill can be taken right after sex or up to 5 days after unprotected sex. It is most effective the sooner you take the pills after having sexual intercourse. Most emergency contraceptive pills are available without a prescription. Check with your pharmacist. Do not use emergency contraception as your only form of birth control. Barrier  methods  Female condom. This is a thin sheath (latex or rubber) that is worn over the penis during sexual intercourse. It can be used with spermicide to increase effectiveness.  Female condom. This is a soft, loose-fitting sheath that is put into the vagina before sexual intercourse.  Diaphragm. This is a soft, latex,  dome-shaped barrier that must be fitted by a health care provider. It is inserted into the vagina, along with a spermicidal jelly. It is inserted before intercourse. The diaphragm should be left in the vagina for 6 to 8 hours after intercourse.  Cervical cap. This is a round, soft, latex or plastic cup that fits over the cervix and must be fitted by a health care provider. The cap can be left in place for up to 48 hours after intercourse.  Sponge. This is a soft, circular piece of polyurethane foam. The sponge has spermicide in it. It is inserted into the vagina after wetting it and before sexual intercourse.  Spermicides. These are chemicals that kill or block sperm from entering the cervix and uterus. They come in the form of creams, jellies, suppositories, foam, or tablets. They do not require a prescription. They are inserted into the vagina with an applicator before having sexual intercourse. The process must be repeated every time you have sexual intercourse. Intrauterine contraception  Intrauterine device (IUD). This is a T-shaped device that is put in a woman's uterus during a menstrual period to prevent pregnancy. There are 2 types: ? Copper IUD. This type of IUD is wrapped in copper wire and is placed inside the uterus. Copper makes the uterus and fallopian tubes produce a fluid that kills sperm. It can stay in place for 10 years. ? Hormone IUD. This type of IUD contains the hormone progestin (synthetic progesterone). The hormone thickens the cervical mucus and prevents sperm from entering the uterus, and it also thins the uterine lining to prevent implantation of a fertilized egg. The hormone can weaken or kill the sperm that get into the uterus. It can stay in place for 3-5 years, depending on which type of IUD is used. Permanent methods of contraception  Female tubal ligation. This is when the woman's fallopian tubes are surgically sealed, tied, or blocked to prevent the egg from traveling  to the uterus.  Hysteroscopic sterilization. This involves placing a small coil or insert into each fallopian tube. Your doctor uses a technique called hysteroscopy to do the procedure. The device causes scar tissue to form. This results in permanent blockage of the fallopian tubes, so the sperm cannot fertilize the egg. It takes about 3 months after the procedure for the tubes to become blocked. You must use another form of birth control for these 3 months.  Female sterilization. This is when the female has the tubes that carry sperm tied off (vasectomy).This blocks sperm from entering the vagina during sexual intercourse. After the procedure, the man can still ejaculate fluid (semen). Natural planning methods  Natural family planning. This is not having sexual intercourse or using a barrier method (condom, diaphragm, cervical cap) on days the woman could become pregnant.  Calendar method. This is keeping track of the length of each menstrual cycle and identifying when you are fertile.  Ovulation method. This is avoiding sexual intercourse during ovulation.  Symptothermal method. This is avoiding sexual intercourse during ovulation, using a thermometer and ovulation symptoms.  Post-ovulation method. This is timing sexual intercourse after you have ovulated. Regardless of which type or method of contraception  you choose, it is important that you use condoms to protect against the transmission of sexually transmitted infections (STIs). Talk with your health care provider about which form of contraception is most appropriate for you. This information is not intended to replace advice given to you by your health care provider. Make sure you discuss any questions you have with your health care provider. Document Released: 06/28/2005 Document Revised: 12/04/2015 Document Reviewed: 12/21/2012 Elsevier Interactive Patient Education  2017 [employer].

## 2017-01-06 NOTE — Progress Notes (Signed)
Subjective:    Colleen Peck - 36 y.o. female MRN [id number]  Date of birth: 1981/01/19  HPI  Colleen Peck is here for a lump on her left breast and a mole on her right leg.  She also wishes to discuss the rectal pain and bleeding she has been having and is interested in contraception options.  She noticed a bump on her left breast at the border of the areola about two weeks ago and she popped it, which resulted in puss and blood draining from the bump.  She says that she has been working outside in the garden and sweating a lot, and she was worried that this could be an infection or a sign of cancer.  She also has a mole on her right leg that has been getting bigger over the last three years and is itching.  The mole does not hurt.  She says that she has been experiencing pain and bright red blood per rectum when she has been having bowel movements recently.  The pain is very significant, and she has tried to avoid eating so that she will not have bowel movements for this reason.  She has tried hemorrhoid cream and this has helped some.     Health Maintenance:   There are no preventive care reminders to display for this patient.  -  reports that she has never smoked. She has never used smokeless tobacco. - Review of Systems: Per HPI. - Past Medical History: Patient Active Problem List   Diagnosis Date Noted  . Dermatofibroma of right lower leg 01/06/2017  . History of borderline personality disorder 01/01/2016  . Carotidynia 12/16/2015  . Schizoaffective disorder, bipolar type ([place]) 11/26/2015  . Anxiety   . Pain of left calf 11/10/2015  . PTSD (post-traumatic stress disorder) 09/30/2015  . Medical clearance for psychiatric admission   . Paranoia ([place])   . Anemia 01/30/2015  . Unspecified vitamin D deficiency 05/22/2013  . Hypothyroidism (acquired) 11/29/2011  . Obesity 09/08/2006  . RHINITIS, ALLERGIC 09/08/2006  . Asthma 09/08/2006  . ECZEMA, ATOPIC DERMATITIS 09/08/2006     - Medications: reviewed and updated   Objective:   Physical Exam BP 100/70 (BP Location: Right Arm, Patient Position: Sitting, Cuff Size: Large)   Pulse 88   Temp 98.2 F (36.8 C) (Oral)   Wt 250 lb 6.4 oz (113.6 kg)   LMP 12/30/2016 (Approximate)   SpO2 98%   BMI 39.22 kg/m  Gen: NAD, alert, cooperative with exam, well-appearing Breast: Resolved small bump on inferior region of areola of left breast.  No redness, swelling, or tenderness. Skin: no rashes, normal turgor.  Firm, dark, well-circumscribed, papular, pigmented lesion on lateral right leg.   Neuro: no gross deficits.  Psych: good insight, alert and oriented        Assessment & Plan:   Dermatofibroma of right lower leg Patient assured that this is not a cancerous lesion and that she can apply hydrocortisone cream and moisturizer to alleviate any itching.  She was advised not to scratch it if possible.  Resolved infected hair follicle of left breast: patient was told that this is a noncancerous lesion that has already resolved.  If the bump returns or worsens, she was advised to call the clinic.  Anal fissure: patient was encouraged to use hemorrhoid cream, epsom salt baths, and to eat plenty of fruits and vegetables and drink plenty of water to keep bowel movements soft.  She was offered a rectal exam, which was  declined at this time.  However, she was encouraged to contact us in six weeks if the pain has not resolved.  Contraception counseling: We have made a follow up appointment to discuss contraception options and assured the patient that no options other than tubal ligation will prevent her from having children after stopping contraception.    Maia Breslow, M.D. 01/06/2017, 12:23 PM PGY-1, [place]

## 2017-01-06 NOTE — Assessment & Plan Note (Signed)
Patient assured that this is not a cancerous lesion and that she can apply hydrocortisone cream and moisturizer to alleviate any itching.  She was advised not to scratch it if possible.

## 2017-03-18 ENCOUNTER — Telehealth: Payer: Self-pay | Admitting: Internal Medicine

## 2017-03-18 ENCOUNTER — Ambulatory Visit (INDEPENDENT_AMBULATORY_CARE_PROVIDER_SITE_OTHER): Payer: Medicaid Other | Admitting: Internal Medicine

## 2017-03-18 ENCOUNTER — Ambulatory Visit: Payer: Self-pay | Admitting: Internal Medicine

## 2017-03-18 ENCOUNTER — Encounter: Payer: Self-pay | Admitting: Internal Medicine

## 2017-03-18 VITALS — BP 108/72 | HR 90 | Temp 98.9°F | Ht 67.0 in | Wt 254.2 lb

## 2017-03-18 DIAGNOSIS — E039 Hypothyroidism, unspecified: Secondary | ICD-10-CM

## 2017-03-18 DIAGNOSIS — R198 Other specified symptoms and signs involving the digestive system and abdomen: Secondary | ICD-10-CM

## 2017-03-18 DIAGNOSIS — R252 Cramp and spasm: Secondary | ICD-10-CM

## 2017-03-18 DIAGNOSIS — F411 Generalized anxiety disorder: Secondary | ICD-10-CM | POA: Diagnosis not present

## 2017-03-18 DIAGNOSIS — B351 Tinea unguium: Secondary | ICD-10-CM

## 2017-03-18 MED ORDER — HYDROXYZINE HCL 25 MG PO TABS: 25.0000 mg | ORAL_TABLET | Freq: Four times a day (QID) | ORAL | 0 refills | Status: DC | PRN

## 2017-03-18 MED ORDER — TERBINAFINE HCL 250 MG PO TABS: ORAL_TABLET | ORAL | 0 refills | Status: DC

## 2017-03-18 MED ORDER — LEVOTHYROXINE SODIUM 112 MCG PO TABS: 224.0000 ug | ORAL_TABLET | Freq: Every day | ORAL | 0 refills | Status: DC

## 2017-03-18 NOTE — Telephone Encounter (Signed)
Rx was sent to the wrong pharmacy. It should have been sent to [facility] in [place].

## 2017-03-18 NOTE — Patient Instructions (Signed)
We will call you with the results of your blood work today. I have sent your medications to the pharmacy. I have also placed the referral to GI.

## 2017-03-18 NOTE — Progress Notes (Signed)
Subjective:    Colleen Peck - 36 y.o. female MRN [id number]  Date of birth: November 04, 1980  HPI  Colleen Peck is here for follow up and various acute conditions.  Hypothyroidism: Reports compliance with her Synthroid. Denies side effect of medication. Denies hot/cold intolerance, hair changes, and unintended weigh changes.   Muscle cramps: Reports muscle cramps in her calves and feet when standing for a long time. She has plantar fascitis and works on a concrete floor which she thinks may be contributing. She denies edema of legs. Does not drink water while working.   Generalized anxiety disorder: Followed by psychiatry in [place]. Takes Seroquel and Lamictal. Requests Atarax as she has used this sparingly in the past for moments of intense anxiety. She feels like with switching to night shift and losing some sleep that her anxiety is a little more heightened than usual. No SI or HI.   Alternating constipation and diarrhea: Patient reports she has not had a normal BM for close to 6 months. She alternates between periods of diarrhea and periods of constipation. Has associated abdominal cramping with bowel movements. Not currently having blood in stool. No fecal incontinence. No nausea or vomiting. No fevers.   Nail Abnormality: Fingernails have been yellowing and brittle/"crumbling" for past 1-2 months. She went to a nail salon in [place] with her friend. Her friend was recently diagnosed with a fungal infection of her nails and has similar appearance in her nails.   -  reports that she has never smoked. She has never used smokeless tobacco. - Review of Systems: Per HPI. - Past Medical History: Patient Active Problem List   Diagnosis Date Noted  . Dermatofibroma of right lower leg 01/06/2017  . History of borderline personality disorder 01/01/2016  . Carotidynia 12/16/2015  . Schizoaffective disorder, bipolar type ([place]) 11/26/2015  . Anxiety   . Pain of left calf 11/10/2015  . PTSD  (post-traumatic stress disorder) 09/30/2015  . Medical clearance for psychiatric admission   . Paranoia ([place])   . Anemia 01/30/2015  . Unspecified vitamin D deficiency 05/22/2013  . Hypothyroidism (acquired) 11/29/2011  . Obesity 09/08/2006  . RHINITIS, ALLERGIC 09/08/2006  . Asthma 09/08/2006  . ECZEMA, ATOPIC DERMATITIS 09/08/2006   - Medications: reviewed and updated   Objective:   Physical Exam BP 108/72   Pulse 90   Temp 98.9 F (37.2 C) (Oral)   Ht 5\' 7"  (1.702 m)   Wt 254 lb 3.2 oz (115.3 kg)   LMP 02/15/2017 (Approximate)   SpO2 98%   BMI 39.81 kg/m  Gen: NAD, alert, cooperative with exam, well-appearing CV: RRR, good S1/S2, no murmur, no edema, negative homan's sign bilaterally  Resp: CTABL, no wheezes, non-labored Abd: SNTND, BS present, no guarding or organomegaly Skin: Yellowing of distal corners of all fingernails spreading proximally with cracking of a few nails as well.     Assessment & Plan:   1. Hypothyroidism, unspecified type - TSH - levothyroxine (SYNTHROID, LEVOTHROID) 112 MCG tablet; Take 2 tablets (224 mcg total) by mouth at bedtime.  Dispense: 180 tablet; Refill: 0  2. Muscle cramps Suspect related to dehydration as patient works prolonged hours standing without fluid intake. May also be related to compensatory stance from plantar fasciitis. Doubt DVT given lack of leg edema and negative Homan's sign as well as intermittent symptoms. Will check BMET to ensure no electrolyte abnormalities.  - Basic Metabolic Panel  3. Generalized anxiety disorder Typically managed by psych. Will refill Atarax as this has worked  well for patient in the past and she has used it sparingly.  - hydrOXYzine (ATARAX/VISTARIL) 25 MG tablet; Take 1 tablet (25 mg total) by mouth every 6 (six) hours as needed for anxiety or itching.  Dispense: 60 tablet; Refill: 0  4. Alternating constipation and diarrhea Concerning for IBS-combination type. Patient has never had a  colonoscopy in the past. No red flags on exam. Will refer to GI for further work up.  - Ambulatory referral to Gastroenterology  5. Onychomycosis Appears consistent with fungal infection of nails. Will treat as such.  - terbinafine (LAMISIL) 250 MG tablet; Take 1 tablet (250 mg) PO once daily x6 weeks.  Dispense: 42 tablet; Refill: 0   Phill Myron, D.O. 03/18/2017, 4:21 PM PGY-3, [place]

## 2017-03-19 LAB — BASIC METABOLIC PANEL
BUN / CREAT RATIO: 12 (ref 9–23)
BUN: 12 mg/dL (ref 6–20)
CHLORIDE: 106 mmol/L (ref 96–106)
CO2: 23 mmol/L (ref 20–29)
Calcium: 9.4 mg/dL (ref 8.7–10.2)
Creatinine, Ser: 0.99 mg/dL (ref 0.57–1.00)
GFR, EST AFRICAN AMERICAN: 86 mL/min/{1.73_m2} (ref >59)
GFR, EST NON AFRICAN AMERICAN: 75 mL/min/{1.73_m2} (ref >59)
Glucose: 77 mg/dL (ref 65–99)
Potassium: 4.3 mmol/L (ref 3.5–5.2)
Sodium: 141 mmol/L (ref 134–144)

## 2017-03-19 LAB — TSH: TSH: 4.3 u[IU]/mL (ref 0.450–4.500)

## 2017-03-21 ENCOUNTER — Encounter: Payer: Self-pay | Admitting: Gastroenterology

## 2017-03-21 ENCOUNTER — Other Ambulatory Visit: Payer: Self-pay | Admitting: Internal Medicine

## 2017-03-21 DIAGNOSIS — F411 Generalized anxiety disorder: Secondary | ICD-10-CM

## 2017-03-21 DIAGNOSIS — E039 Hypothyroidism, unspecified: Secondary | ICD-10-CM

## 2017-03-21 DIAGNOSIS — B351 Tinea unguium: Secondary | ICD-10-CM

## 2017-03-21 MED ORDER — HYDROXYZINE HCL 25 MG PO TABS: 25.0000 mg | ORAL_TABLET | Freq: Four times a day (QID) | ORAL | 0 refills | Status: DC | PRN

## 2017-03-21 MED ORDER — TERBINAFINE HCL 250 MG PO TABS: ORAL_TABLET | ORAL | 0 refills | Status: DC

## 2017-03-21 MED ORDER — LEVOTHYROXINE SODIUM 112 MCG PO TABS: 224.0000 ug | ORAL_TABLET | Freq: Every day | ORAL | 0 refills | Status: DC

## 2017-03-23 NOTE — Telephone Encounter (Signed)
Message given to pt

## 2017-03-23 NOTE — Telephone Encounter (Signed)
LVM for pt to call the office. If she calls, please give her the message below. Ottis Stain, CMA

## 2017-03-23 NOTE — Telephone Encounter (Signed)
-----   Message from Nicolette Bang, DO sent at 03/21/2017  7:40 AM EDT ----- Please call patient to let her know thyroid level, electrolytes, and kidney function were all within normal range.   Phill Myron, D.O. 03/21/2017, 7:40 AM PGY-3, [place]

## 2017-04-06 ENCOUNTER — Other Ambulatory Visit: Payer: Self-pay | Admitting: *Deleted

## 2017-04-06 DIAGNOSIS — E039 Hypothyroidism, unspecified: Secondary | ICD-10-CM

## 2017-04-06 MED ORDER — LEVOTHYROXINE SODIUM 112 MCG PO TABS: 224.0000 ug | ORAL_TABLET | Freq: Every day | ORAL | 0 refills | Status: DC

## 2017-04-15 ENCOUNTER — Telehealth: Payer: Self-pay | Admitting: Family Medicine

## 2017-04-15 NOTE — Telephone Encounter (Signed)
**  After Hours/ Emergency Line Call*  Received a call regarding you medication for onychomycosis.  Endorsing concerned for interaction with liver damage given her liver function was not tested prior to taking Lamisil. Recommended that patient call [facility] on Monday to have lab work ordered. Also discussed this was not an appropriate call for the emergency line.  Will forward to PCP.  Harriet Butte, [place], PGY-2

## 2017-05-18 ENCOUNTER — Ambulatory Visit: Payer: Self-pay | Admitting: Gastroenterology

## 2017-05-28 ENCOUNTER — Ambulatory Visit (INDEPENDENT_AMBULATORY_CARE_PROVIDER_SITE_OTHER): Payer: Worker's Compensation

## 2017-05-28 ENCOUNTER — Encounter (HOSPITAL_COMMUNITY): Payer: Self-pay | Admitting: Family Medicine

## 2017-05-28 ENCOUNTER — Ambulatory Visit (HOSPITAL_COMMUNITY)
Admission: EM | Admit: 2017-05-28 | Discharge: 2017-05-28 | Disposition: A | Discharge status: Home / Self Care | Payer: Worker's Compensation | Attending: Family Medicine | Admitting: Family Medicine

## 2017-05-28 DIAGNOSIS — M25571 Pain in right ankle and joints of right foot: Secondary | ICD-10-CM

## 2017-05-28 MED ORDER — IBUPROFEN 600 MG PO TABS: 600.0000 mg | ORAL_TABLET | Freq: Four times a day (QID) | ORAL | 0 refills | Status: DC | PRN

## 2017-05-28 NOTE — ED Triage Notes (Signed)
Pt here for right ankle pain after a jack at work hit ankle. She works for the [employer], Chartered certified accountant.

## 2017-06-07 NOTE — ED Provider Notes (Signed)
[place]   [id number] 05/28/17 Arrival Time: 1910  ASSESSMENT & PLAN:  1. Acute right ankle pain     Meds ordered this encounter  Medications  . ibuprofen (ADVIL,MOTRIN) 600 MG tablet    Sig: Take 1 tablet (600 mg total) every 6 (six) hours as needed by mouth for moderate pain.    Dispense:  30 tablet    Refill:  0   Will treat as a contusion. Ibuprofen as needed. WBAT. To f/u with occ health next business day. Information given.  Reviewed expectations re: course of current medical issues. Questions answered. Outlined signs and symptoms indicating need for more acute intervention. Patient verbalized understanding. After Visit Summary given.   SUBJECTIVE:  Colleen Peck is a 36 y.o. female who reports pain of her right ankle. Reports that a mobile forklift hit her ankle as she was moving objects at work. Gradual onset of discomfort. Has remained ambulatory. Current symptoms include ability to bear weight, but with some pain.  Aggravating symptoms: none reported. "Ankle just sore." No OTC treatment. No extremity sensation changes or weakness. No previous ankle injuries reported.   ROS: As per HPI.   OBJECTIVE:  There were no vitals filed for this visit.  General appearance: alert; no distress Extremities: no cyanosis or edema; symmetrical with no gross deformities; tenderness over her right lateral ankle with mild swelling and no bruising; FROM; tenderness is poorly localized CV: normal extremity capillary refill Skin: warm and dry Neurologic: normal gait; normal symmetric reflexes in all extremities; normal sensation Psychological: alert and cooperative; normal mood and affect  Imaging: Dg Ankle Complete Right  Result Date: 05/28/2017 CLINICAL DATA:  Blunt trauma to the right ankle laterally with pain, initial encounter EXAM: RIGHT ANKLE - COMPLETE 3+ VIEW COMPARISON:  None. FINDINGS: Mild soft tissue swelling is noted laterally. No acute fracture or  dislocation is noted. No other focal abnormality is seen. IMPRESSION: Mild soft tissue swelling related to the recent injury. No acute bony abnormality noted. Electronically Signed   By: Inez Catalina M.D.   On: 05/28/2017 20:03    Allergies  Allergen Reactions  . Abilify [Aripiprazole] Other (See Comments)    AKATHISIA    Past Medical History:  Diagnosis Date  . Abnormal pap    pt reports abnl pap many years ago.  Nl since then.  . Asthma   . Bipolar disorder ([place])   . Depression   . Palpitations 03/12/2008  . PTSD (post-traumatic stress disorder)   . Seasonal allergies   . Thyroid disease 2009   Graves disease (pt reported resolved); hypothyriodism   Social History   Socioeconomic History  . Marital status: Single    Spouse name: Not on file  . Number of children: Not on file  . Years of education: Not on file  . Highest education level: Not on file  Social Needs  . Financial resource strain: Not on file  . Food insecurity - worry: Not on file  . Food insecurity - inability: Not on file  . Transportation needs - medical: Not on file  . Transportation needs - non-medical: Not on file  Occupational History  . Not on file  Tobacco Use  . Smoking status: Never Smoker  . Smokeless tobacco: Never Used  Substance and Sexual Activity  . Alcohol use: No  . Drug use: Yes    Types: Marijuana    Comment: past use of marijuana in '08-'09. occasional eats brownies w/ marijuana  before thanksgiving  .  Sexual activity: Not Currently    Birth control/protection: Abstinence  Other Topics Concern  . 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   Family History  Problem Relation Age of Onset  . Drug abuse Father   . Depression Maternal Grandmother   . Anxiety disorder Maternal Grandmother   . COPD Maternal Grandmother   . Suicidality Cousin   . Depression Cousin   . Bipolar disorder Cousin     . Hypertension Mother   . Depression Mother   . Diabetes Paternal Grandfather   . COPD Paternal Grandmother   . Depression Maternal Aunt   . Depression Maternal Aunt   . Heart disease Neg Hx    Past Surgical History:  Procedure Laterality Date  . DILATION AND CURETTAGE OF UTERUS  March 2006     Vanessa Kick, MD 06/07/17 [phone]

## 2017-06-28 ENCOUNTER — Ambulatory Visit: Payer: Medicaid Other | Admitting: Internal Medicine

## 2017-06-28 ENCOUNTER — Encounter: Payer: Self-pay | Admitting: Internal Medicine

## 2017-06-28 ENCOUNTER — Other Ambulatory Visit: Payer: Self-pay

## 2017-06-28 VITALS — BP 104/60 | HR 62 | Temp 98.4°F | Ht 67.0 in | Wt 239.4 lb

## 2017-06-28 DIAGNOSIS — G5603 Carpal tunnel syndrome, bilateral upper limbs: Secondary | ICD-10-CM | POA: Diagnosis not present

## 2017-06-28 DIAGNOSIS — M25512 Pain in left shoulder: Secondary | ICD-10-CM

## 2017-06-28 MED ORDER — MELOXICAM 15 MG PO TABS: 15.0000 mg | ORAL_TABLET | Freq: Every day | ORAL | 0 refills | Status: DC

## 2017-06-28 NOTE — Patient Instructions (Signed)
I have prescribed Mobic, an anti-inflammatory medication, for your shoulder pain. Take this daily for one month. I believe you have a rotator cuff strain likely from repetitive movements at work and heavy lifting. I would also recommend ice and/or heat to the area. If your pain has not improved in the next 4-6 weeks please return so that we can examine again and possibly get some imaging.   For your carpal tunnel, I have given you a prescription for wrist splints. Wear these nightly to see if it helps with the pain and numbness of your fingers.    Rotator Cuff Injury Rotator cuff injury is any type of injury to the set of muscles and tendons that make up the stabilizing unit of your shoulder. This unit holds the ball of your upper arm bone (humerus) in the socket of your shoulder blade (scapula). What are the causes? Injuries to your rotator cuff most commonly come from sports or activities that cause your arm to be moved repeatedly over your head. Examples of this include throwing, weight lifting, swimming, or racquet sports. Long lasting (chronic) irritation of your rotator cuff can cause soreness and swelling (inflammation), bursitis, and eventual damage to your tendons, such as a tear (rupture). What are the signs or symptoms? Acute rotator cuff tear:  Sudden tearing sensation followed by severe pain shooting from your upper shoulder down your arm toward your elbow.  Decreased range of motion of your shoulder because of pain and muscle spasm.  Severe pain.  Inability to raise your arm out to the side because of pain and loss of muscle power (large tears).  Chronic rotator cuff tear:  Pain that usually is worse at night and may interfere with sleep.  Gradual weakness and decreased shoulder motion as the pain worsens.  Decreased range of motion.  Rotator cuff tendinitis:  Deep ache in your shoulder and the outside upper arm over your shoulder.  Pain that comes on gradually and  becomes worse when lifting your arm to the side or turning it inward.  How is this diagnosed? Rotator cuff injury is diagnosed through a medical history, physical exam, and imaging exam. The medical history helps determine the type of rotator cuff injury. Your health care provider will look at your injured shoulder, feel the injured area, and ask you to move your shoulder in different positions. X-ray exams typically are done to rule out other causes of shoulder pain, such as fractures. MRI is the exam of choice for the most severe shoulder injuries because the images show muscles and tendons. How is this treated? Chronic tear:  Medicine for pain, such as acetaminophen or ibuprofen.  Physical therapy and range-of-motion exercises may be helpful in maintaining shoulder function and strength.  Steroid injections into your shoulder joint.  Surgical repair of the rotator cuff if the injury does not heal with noninvasive treatment.  Acute tear:  Anti-inflammatory medicines such as ibuprofen and naproxen to help reduce pain and swelling.  A sling to help support your arm and rest your rotator cuff muscles. Long-term use of a sling is not advised. It may cause significant stiffening of the shoulder joint.  Surgery may be considered within a few weeks, especially in younger, active people, to return the shoulder to full function.  Indications for surgical treatment include the following: ? Age younger than 64 years. ? Rotator cuff tears that are complete. ? Physical therapy, rest, and anti-inflammatory medicines have been used for 6-8 weeks, with no improvement. ?  Employment or sporting activity that requires constant shoulder use.  Tendinitis:  Anti-inflammatory medicines such as ibuprofen and naproxen to help reduce pain and swelling.  A sling to help support your arm and rest your rotator cuff muscles. Long-term use of a sling is not advised. It may cause significant stiffening of the  shoulder joint.  Severe tendinitis may require: ? Steroid injections into your shoulder joint. ? Physical therapy. ? Surgery.  Follow these instructions at home:  Apply ice to your injury: ? Put ice in a plastic bag. ? Place a towel between your skin and the bag. ? Leave the ice on for 20 minutes, 2-3 times a day.  If you have a shoulder immobilizer (sling and straps), wear it until told otherwise by your health care provider.  You may want to sleep on several pillows or in a recliner at night to lessen swelling and pain.  Only take over-the-counter or prescription medicines for pain, discomfort, or fever as directed by your health care provider.  Do simple hand squeezing exercises with a soft rubber ball to decrease hand swelling. Contact a health care provider if:  Your shoulder pain increases, or new pain or numbness develops in your arm, hand, or fingers.  Your hand or fingers are colder than your other hand. Get help right away if:  Your arm, hand, or fingers are numb or tingling.  Your arm, hand, or fingers are increasingly swollen and painful, or they turn white or blue. This information is not intended to replace advice given to you by your health care provider. Make sure you discuss any questions you have with your health care provider. Document Released: 06/25/2000 Document Revised: 12/04/2015 Document Reviewed: 02/07/2013 Elsevier Interactive Patient Education  2018 [employer].

## 2017-06-28 NOTE — Progress Notes (Signed)
Subjective:    Colleen Peck - 36 y.o. female MRN [id number]  Date of birth: Feb 24, 1981  HPI  Colleen Peck is here for SDA for shoulder pain.  Shoulder Pain: Left sided. Pain is located deep on the lateral aspect of the shoulder. Noticed it on Friday, 5 days prior to office visit. No falls or trauma to the area. She does note that she has a bruise on it but she doent know where it came from. Internal rotation is the most painful movement. Putting on or taking off clothing exacerbates the pain. Has not tried anything at home to relieve the pain. 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 Also recently started working out with personal trainer lifting weights but has not been doing much of that in the past 1-2 weeks due to busy season at work. Has never had shoulder pain like this before.    Finger Tingling: Patient also endorses bilateral finger tingling and numbness. Has associated pain in the thenar eminence bilaterally. Reduced grip strength reported. Endorses difficulty opening jars and bottles. Numbness worst in the morning after sleeping. Has to shake out her hands.   There are no preventive care reminders to display for this patient.  -  reports that  has never smoked. she has never used smokeless tobacco. - Review of Systems: Per HPI. - Past Medical History: Patient Active Problem List   Diagnosis Date Noted  . Dermatofibroma of right lower leg 01/06/2017  . History of borderline personality disorder 01/01/2016  . Carotidynia 12/16/2015  . Schizoaffective disorder, bipolar type ([place]) 11/26/2015  . Anxiety   . Pain of left calf 11/10/2015  . PTSD (post-traumatic stress disorder) 09/30/2015  . Medical clearance for psychiatric admission   . Paranoia ([place])   . Anemia 01/30/2015  . Unspecified vitamin D deficiency 05/22/2013  . Hypothyroidism (acquired) 11/29/2011  . Obesity 09/08/2006  . RHINITIS, ALLERGIC 09/08/2006  . Asthma 09/08/2006  . ECZEMA, ATOPIC  DERMATITIS 09/08/2006   - Medications: reviewed and updated   Objective:   Physical Exam BP 104/60   Pulse 62   Temp 98.4 F (36.9 C) (Oral)   Ht 5\' 7"  (1.702 m)   Wt 239 lb 6.4 oz (108.6 kg)   LMP 06/19/2017 (Exact Date)   SpO2 97%   BMI 37.50 kg/m  Gen: NAD, alert, cooperative with exam, well-appearing MSK: No thenar eminence atrophy. Full ROM at wrists bilaterally. Left shoulder with small healing bruise over the lateral aspect. No other skin color changes or bony deformities noted. Mild TTP diffusely over later aspect of shoulder. Left shoulder with limited ROM in internal rotation. Other planes of motion preserved but pain with external rotation and abduction above 90 degrees.  Neuro: Sensation to UE preserved bilaterally. Negative DeQuervein's test bilaterally. Positive Tinel sign bilaterally. Grip strength 5/5. Strength of left shoulder intact. Negative Neer's test. Negative Hawkin's test.   Assessment & Plan:   1. Acute pain of left shoulder Suspect related to rotator cuff tendonitis given reduced ROM secondary to pain and location of pain. Doubt full rotator cuff tear given preserved strength. No sign of impingement on exam. Suspect related to repeated lifting at job as well as new exercise regimen. Prescribed Mobic. Recommended ice and heat to shoulder. Reviewed rotator cuff exercises. Patient plans to take them to her personal trainer for assistance. If pain is not improving in 4-6 weeks, have asked patient to return. Would consider imaging at that point.   2. Bilateral carpal tunnel syndrome  Suspect numbness/tingling/reduced grip strength related to carpal tunnel given positive Tinnel's sign and need to "shake out" wrists in morning. Likely due to stress from her job. Written Rx for bilateral cock up wrist splints given to patient. Anti-inflammatory medication prescribed for shoulder will also likely benefit patient.   Phill Myron, D.O. 06/29/2017, 1:14 PM PGY-3, [place]

## 2017-08-01 ENCOUNTER — Other Ambulatory Visit: Payer: Self-pay

## 2017-08-01 ENCOUNTER — Ambulatory Visit (HOSPITAL_COMMUNITY): Payer: Medicaid Other

## 2017-08-01 ENCOUNTER — Encounter (HOSPITAL_COMMUNITY): Payer: Self-pay | Admitting: Emergency Medicine

## 2017-08-01 ENCOUNTER — Ambulatory Visit (HOSPITAL_COMMUNITY)
Admission: EM | Admit: 2017-08-01 | Discharge: 2017-08-01 | Disposition: A | Discharge status: Home / Self Care | Payer: Medicaid Other | Attending: Family Medicine | Admitting: Family Medicine

## 2017-08-01 DIAGNOSIS — M75102 Unspecified rotator cuff tear or rupture of left shoulder, not specified as traumatic: Secondary | ICD-10-CM

## 2017-08-01 MED ORDER — KETOROLAC TROMETHAMINE 30 MG/ML IJ SOLN
INTRAMUSCULAR | Status: AC
  Filled 2017-08-01: qty 1

## 2017-08-01 MED ORDER — KETOROLAC TROMETHAMINE 60 MG/2ML IM SOLN
30.0000 mg | Freq: Once | INTRAMUSCULAR | Status: AC
  Administered 2017-08-01: 30 mg via INTRAMUSCULAR

## 2017-08-01 MED ORDER — PREDNISONE 10 MG PO TABS: ORAL_TABLET | ORAL | 0 refills | Status: DC

## 2017-08-01 NOTE — Discharge Instructions (Signed)
Please take Tylenol as needed for pain.  Primary care provider orthopedics if no improvement also take prednisone taper over the next 10 days.  Follow-up with primary care provider or orthopedics if no improvement

## 2017-08-01 NOTE — ED Triage Notes (Signed)
Pt reports left shoulder pain for over one month.  She was seen by her PCP and given Meloxicam.  Pt states the medication is not working and she is in a lot of pain.

## 2017-08-01 NOTE — ED Provider Notes (Signed)
[place]    CSN: [id number] Arrival date & time: 08/01/17  1738     History   Chief Complaint Chief Complaint  Patient presents with  . Shoulder Pain    left    HPI Colleen Peck is a 37 y.o. female presents to the urgent care facility for evaluation of left shoulder pain.  She denies any trauma or injury.  Pain is been present times 1 month.  No numbness tingling or radicular symptoms.  Pain is located over the lateral deltoid and increased with internal rotation as well as abduction greater than 90 degrees.  Pain is located along the lateral deltoid.  She has been taking meloxicam with no improvement.  She was given home exercise program with no improvement.  Describes a history of carpal tunnel symptoms that has resolved with wrist brace.  Patient works at the [employer], performs a lot of lifting pushing and pulling  HPI  Past Medical History:  Diagnosis Date  . Abnormal pap    pt reports abnl pap many years ago.  Nl since then.  . Asthma   . Bipolar disorder ([place])   . Depression   . Palpitations 03/12/2008  . PTSD (post-traumatic stress disorder)   . Seasonal allergies   . Thyroid disease 2009   Graves disease (pt reported resolved); hypothyriodism    Patient Active Problem List   Diagnosis Date Noted  . Dermatofibroma of right lower leg 01/06/2017  . History of borderline personality disorder 01/01/2016  . Carotidynia 12/16/2015  . Schizoaffective disorder, bipolar type ([place]) 11/26/2015  . Anxiety   . Pain of left calf 11/10/2015  . PTSD (post-traumatic stress disorder) 09/30/2015  . Medical clearance for psychiatric admission   . Paranoia ([place])   . Anemia 01/30/2015  . Unspecified vitamin D deficiency 05/22/2013  . Hypothyroidism (acquired) 11/29/2011  . Obesity 09/08/2006  . RHINITIS, ALLERGIC 09/08/2006  . Asthma 09/08/2006  . ECZEMA, ATOPIC DERMATITIS 09/08/2006    Past Surgical History:  Procedure Laterality Date  . DILATION AND  CURETTAGE OF UTERUS  March 2006    OB History    No data available       Home Medications    Prior to Admission medications   Medication Sig Start Date End Date Taking? Authorizing Provider  albuterol (PROVENTIL HFA;VENTOLIN HFA) 108 (90 Base) MCG/ACT inhaler Inhale 2 puffs into the lungs every 6 (six) hours as needed for wheezing or shortness of breath. 01/13/16  Yes Lindell Spar I, NP  beclomethasone (QVAR) 80 MCG/ACT inhaler Inhale 1 puff into the lungs 2 (two) times daily.   Yes [provider]  hydrOXYzine (ATARAX/VISTARIL) 25 MG tablet Take 1 tablet (25 mg total) by mouth every 6 (six) hours as needed for anxiety or itching. 03/21/17  Yes Nicolette Bang, DO  ibuprofen (ADVIL,MOTRIN) 600 MG tablet Take 1 tablet (600 mg total) every 6 (six) hours as needed by mouth for moderate pain. 05/28/17  Yes Vanessa Kick, MD  lamoTRIgine (LAMICTAL) 25 MG tablet Take 1 tablet (25 mg) in the morning & 2 tablets (50 mg) in the evening: For mood stabilization 01/13/16  Yes Nwoko, Herbert Pun I, NP  levothyroxine (SYNTHROID, LEVOTHROID) 112 MCG tablet Take 2 tablets (224 mcg total) by mouth at bedtime. 04/06/17  Yes Nicolette Bang, DO  meloxicam (MOBIC) 15 MG tablet Take 1 tablet (15 mg total) by mouth daily. 06/28/17  Yes Nicolette Bang, DO  QUEtiapine (SEROQUEL) 50 MG tablet Take 5 tablets (250 mg  total) by mouth daily at 8 pm. For mood control 01/13/16  Yes Nwoko, Herbert Pun I, NP  docusate sodium (COLACE) 100 MG capsule Take 1 capsule (100 mg total) by mouth 2 (two) times daily. For constipation Patient not taking: Reported on 04/28/2016 01/13/16   Lindell Spar I, NP  FLUoxetine (PROZAC) 20 MG capsule Take 1 capsule (20 mg total) by mouth daily. For depression Patient not taking: Reported on 04/28/2016 01/13/16   Lindell Spar I, NP  predniSONE (DELTASONE) 10 MG tablet 10 day taper. 5,5,4,4,3,3,2,2,1,1 08/01/17   Duanne Guess, PA-C  terbinafine (LAMISIL) 250 MG tablet Take 1  tablet (250 mg) PO once daily x6 weeks. 03/21/17   Nicolette Bang, DO    Family History Family History  Problem Relation Age of Onset  . Drug abuse Father   . Depression Maternal Grandmother   . Anxiety disorder Maternal Grandmother   . COPD Maternal Grandmother   . Suicidality Cousin   . Depression Cousin   . Bipolar disorder Cousin   . Hypertension Mother   . Depression Mother   . Diabetes Paternal Grandfather   . COPD Paternal Grandmother   . Depression Maternal Aunt   . Depression Maternal Aunt   . Heart disease Neg Hx     Social History Social History   Tobacco Use  . Smoking status: Never Smoker  . Smokeless tobacco: Never Used  Substance Use Topics  . Alcohol use: No  . Drug use: Yes    Types: Marijuana    Comment: past use of marijuana in '08-'09. occasional eats brownies w/ marijuana  before thanksgiving     Allergies   Abilify [aripiprazole]   Review of Systems Review of Systems  Constitutional: Negative for fever.  Respiratory: Negative for shortness of breath.   Cardiovascular: Negative for chest pain.  Gastrointestinal: Negative for abdominal pain.  Genitourinary: Negative for difficulty urinating, dysuria and urgency.  Musculoskeletal: Positive for arthralgias. Negative for back pain and myalgias.  Skin: Negative for rash.  Neurological: Negative for dizziness and headaches.     Physical Exam Triage Vital Signs ED Triage Vitals  Enc Vitals Group     BP 08/01/17 1817 124/76     Pulse Rate 08/01/17 1817 71     Resp --      Temp 08/01/17 1817 99 F (37.2 C)     Temp Source 08/01/17 1817 Oral     SpO2 08/01/17 1817 100 %     Weight --      Height --      Head Circumference --      Peak Flow --      Pain Score 08/01/17 1816 8     Pain Loc --      Pain Edu? --      Excl. in [place]? --    No data found.  Updated Vital Signs BP 124/76 (BP Location: Left Arm)   Pulse 71   Temp 99 F (37.2 C) (Oral)   LMP 07/11/2017  (Approximate)   SpO2 100%   Visual Acuity Right Eye Distance:   Left Eye Distance:   Bilateral Distance:    Right Eye Near:   Left Eye Near:    Bilateral Near:     Physical Exam  Constitutional: She is oriented to person, place, and time. She appears well-developed and well-nourished. No distress.  HENT:  Head: Normocephalic and atraumatic.  Mouth/Throat: Oropharynx is clear and moist.  Eyes: Conjunctivae are normal. Right eye exhibits no discharge.  Left eye exhibits no discharge.  Neck: Normal range of motion.  Cardiovascular: Normal rate.  Pulmonary/Chest: No respiratory distress.  Musculoskeletal:  Cervical Spine: Examination of the cervical spine reveals no bony abnormality, no edema, and no ecchymosis.  There is no step-off.  The patient has full active and passive range of motion of the cervical spine with flexion, extension, and right and left bend with rotation.  There is no crepitus with range of motion exercises.  The patient is non-tender along the spinous process to palpation.  The patient has no paravertebral muscle spasm.  There is no parascapular discomfort.  The patient has a negative axial compression test.  The patient has a negative Spurling test.  The patient has a negative overhead arm test for thoracic outlet syndrome.    Left upper Extremity: Examination of the left shoulder and arm showed no bony abnormality or edema.  The patient has limited active and normal passive motion with abduction, flexion, internal rotation, and external rotation.  Patient has pain with abduction and internal rotation.  The patient has a positive Hawkins test and a positive impingement test.  The patient has a negative drop arm test.  The patient is non-tender along the deltoid muscle.  There is moderate left subacromial space tenderness with no AC joint tenderness.  The patient has no instability of the shoulder with anterior-posterior motion.  There is a negative sulcus sign.  The rotator  cuff muscle strength is 4/5 with supraspinatus, 4/5 with internal rotation, and 5/5 with external rotation.  There is no crepitus with range of motion activities.      Neurological: She is alert and oriented to person, place, and time. She has normal reflexes.  Skin: Skin is warm and dry.  Psychiatric: She has a normal mood and affect. Her behavior is normal. Thought content normal.     UC Treatments / Results  Labs (all labs ordered are listed, but only abnormal results are displayed) Labs Reviewed - No data to display  EKG  EKG Interpretation None       Radiology Dg Shoulder Left  Result Date: 08/01/2017 CLINICAL DATA:  Left shoulder pain. EXAM: LEFT SHOULDER - 2+ VIEW COMPARISON:  None. FINDINGS: There is no evidence of fracture or dislocation. There is no evidence of arthropathy or other focal bone abnormality. Soft tissues are unremarkable. IMPRESSION: Negative. Electronically Signed   By: Aletta Edouard M.D.   On: 08/01/2017 19:56    Procedures Procedures (including critical care time)  Medications Ordered in UC Medications  ketorolac (TORADOL) injection 30 mg (30 mg Intramuscular Given 08/01/17 1957)     Initial Impression / Assessment and Plan / UC Course  I have reviewed the triage vital signs and the nursing notes.  Pertinent labs & imaging results that were available during my care of the patient were reviewed by me and considered in my medical decision making (see chart for details).     37 year old female with left shoulder pain.  Signs and symptoms consistent with rotator cuff syndrome.  X-rays today negative for any acute bony abnormality.  She is placed on a 10-day steroid taper, recommend follow-up with orthopedics.  She will avoid any overhead lifting pushing or pulling.  She is educated on signs and symptoms return to clinic for.  Final Clinical Impressions(s) / UC Diagnoses   Final diagnoses:  Rotator cuff syndrome, left    ED Discharge Orders         Ordered    predniSONE (DELTASONE)  10 MG tablet     08/01/17 1938         Renata Caprice 08/01/17 2004

## 2017-08-05 ENCOUNTER — Other Ambulatory Visit: Payer: Self-pay | Admitting: Family Medicine

## 2017-08-05 ENCOUNTER — Telehealth: Payer: Self-pay | Admitting: Internal Medicine

## 2017-08-05 DIAGNOSIS — R198 Other specified symptoms and signs involving the digestive system and abdomen: Secondary | ICD-10-CM

## 2017-08-05 NOTE — Telephone Encounter (Signed)
Pt needs another referral to [facility].  She did not go to the appt in Nov 2018 because her job would not let her off.  Please advise

## 2017-08-08 ENCOUNTER — Ambulatory Visit (INDEPENDENT_AMBULATORY_CARE_PROVIDER_SITE_OTHER): Payer: Medicaid Other | Admitting: Student

## 2017-08-08 ENCOUNTER — Encounter: Payer: Self-pay | Admitting: Student

## 2017-08-08 VITALS — BP 110/80 | HR 81 | Temp 98.2°F | Ht 67.0 in | Wt 244.4 lb

## 2017-08-08 DIAGNOSIS — B001 Herpesviral vesicular dermatitis: Secondary | ICD-10-CM | POA: Diagnosis not present

## 2017-08-08 MED ORDER — VALACYCLOVIR HCL 1 G PO TABS: 2000.0000 mg | ORAL_TABLET | Freq: Two times a day (BID) | ORAL | 3 refills | Status: DC

## 2017-08-08 NOTE — Patient Instructions (Signed)
Cold Sore A cold sore, also called a fever blister, is a skin infection that is caused by a virus. This infection causes small, fluid-filled sores to form inside of the mouth or on the lips, gums, nose, chin, or cheeks. Cold sores can spread to other parts of the body, such as the eyes or fingers. Cold sores can be spread or passed from person to person (contagious) until the sores crust over completely. Cold sores can be spread through close contact, such as kissing or sharing a drinking glass. Follow these instructions at home: Medicines  Take or apply over-the-counter and prescription medicines only as told by your doctor.  Use a cotton-tip swab to apply creams or gels to your sores. Sore Care  Do not touch the sores or pick the scabs.  Wash your hands often. Do not touch your eyes without washing your hands first.  Keep the sores clean and dry.  If directed, apply ice to the sores:  Put ice in a plastic bag.  Place a towel between your skin and the bag.  Leave the ice on for 20 minutes, 2-3 times per day. Lifestyle  Do not kiss, have oral sex, or share personal items until your sores heal.  Eat a soft, bland diet. Avoid eating hot, cold, or salty foods. These can hurt your mouth.  Use a straw if it hurts to drink out of a glass.  Avoid the sun and limit your stress if these things trigger outbreaks. If sun causes cold sores, apply sunscreen on your lips before being out in the sun. Contact a doctor if:  You have symptoms for more than two weeks.  You have pus coming from the sores.  You have redness that is spreading.  You have pain or irritation in your eye.  You get sores on your genitals.  Your sores do not heal within two weeks.  You get cold sores often. Get help right away if:  You have a fever and your symptoms suddenly get worse.  You have a headache and confusion. This information is not intended to replace advice given to you by your health care  provider. Make sure you discuss any questions you have with your health care provider. Document Released: 12/28/2011 Document Revised: 12/04/2015 Document Reviewed: 04/18/2015 Elsevier Interactive Patient Education  2018 Elsevier Inc.  

## 2017-08-08 NOTE — Progress Notes (Signed)
  Subjective:    Colleen Peck is a 37 y.o. old female here for rash on her face  HPI Skin lesion: for two days. Started as a blister. Peck of this in the past. Had ear-piercing three days ago. She wonders if she got something from this procedure. Had cold like symptoms for two days. Reports fever but didn't check her tempeture. No sexual intercourse or kissing in 8 years.  PMH/Problem List: has Obesity; RHINITIS, ALLERGIC; Asthma; ECZEMA, ATOPIC DERMATITIS; Hypothyroidism (acquired); Unspecified vitamin D deficiency; Anemia; Medical clearance for psychiatric Peck; Paranoia Colleen Peck); PTSD (post-traumatic stress disorder); Pain of left calf; Schizoaffective disorder, bipolar type (Colleen Peck); Anxiety; Carotidynia; Peck of borderline personality disorder; Dermatofibroma of right lower leg; and Cold sore on their problem list.   has a past medical Peck of Abnormal pap, Asthma, Bipolar disorder (Colleen Peck), Depression, Palpitations (03/12/2008), PTSD (post-traumatic stress disorder), Seasonal allergies, and Thyroid disease (2009).  FH:  Colleen Peck  Problem Relation Age of Onset  . Drug abuse Father   . Depression Colleen Peck   . Anxiety disorder Colleen Peck   . COPD Colleen Peck   . Suicidality Colleen Peck   . Depression Colleen Peck   . Bipolar disorder Colleen Peck   . Hypertension Mother   . Depression Mother   . Diabetes Colleen Peck   . COPD Colleen Peck   . Depression Colleen Peck   . Depression Colleen Peck   . Heart disease Colleen Peck     SH Social Peck   Tobacco Use  . Smoking status: Never Smoker  . Smokeless tobacco: Never Used  Substance Use Topics  . Alcohol use: No  . Drug use: Yes    Types: Marijuana    Comment: past use of marijuana in '08-'09. occasional eats brownies w/ marijuana  before thanksgiving    Review of Systems Review of systems negative except for pertinent positives and negatives in Peck of present illness above.       Objective:     Vitals:   08/08/17 1546  BP: 110/80  Pulse: 81  Temp: 98.2 F (36.8 C)  TempSrc: Oral  SpO2: 99%  Weight: 244 lb 6.4 oz (110.9 kg)  Height: 5\' 7"  (1.702 m)   Body mass index is 38.28 kg/m.  Physical Exam  GEN: appears well, no apparent distress. Face: crusted and new fine blister clusters over the lateral end of her right upper lip about 1 cm in diameter. No surrounding erythema. See picture for more.    HEM: negative for cervical or periauricular lymphadenopathies CVS: RRR, nl s1 & s2 RESP: no IWOB MSK: no focal tenderness or notable swelling SKIN: as above NEURO: alert and oiented appropriately, no gross deficits  PSYCH: anxious about the lesion on her lip    Assessment and Plan:  1. Herpes labialis: lesion consistent with herpes labialis. Discussed the nature of the disease, transmission, treatment and precautions. Has symptoms for less than 2 days. So she would benefit from Valtrex. - HSV(herpes smplx)abs-1+2(IgG+IgM)-bld. Was not able to swab and patient prefers blood draw. - valACYclovir (VALTREX) 1000 MG tablet; Take 2 tablets (2,000 mg total) by mouth 2 (two) times daily.  Dispense: 4 tablet; Refill: 3  Return if symptoms worsen or fail to improve.  Mercy Riding, MD 08/08/17 Pager: [phone]

## 2017-08-08 NOTE — Telephone Encounter (Signed)
Have placed another GI referral.   Phill Myron, D.O. 08/08/2017, 9:08 AM PGY-3, [facility]

## 2017-08-09 NOTE — Telephone Encounter (Signed)
Pt informed. Sharon T Saunders, CMA  

## 2017-08-10 LAB — HSV(HERPES SMPLX)ABS-I+II(IGG+IGM)-BLD
HSV 1 GLYCOPROTEIN G AB, IGG: 31 {index} — AB (ref 0.00–0.90)
HSV 2 IgG, Type Spec: <0.91 index (ref 0.00–0.90)

## 2017-08-11 ENCOUNTER — Other Ambulatory Visit: Payer: Self-pay

## 2017-08-11 DIAGNOSIS — E039 Hypothyroidism, unspecified: Secondary | ICD-10-CM

## 2017-08-11 NOTE — Telephone Encounter (Signed)
Patient left message on nurse line requesting refill on Levothyroxine stating it had been denied. Upon chart review it appears that electronic refill request was sent to a provider not at this location. Will send to PCP. Danley Danker, RN [facility] RN)

## 2017-08-12 ENCOUNTER — Encounter: Payer: Self-pay | Admitting: Gastroenterology

## 2017-08-15 MED ORDER — LEVOTHYROXINE SODIUM 112 MCG PO TABS: 224.0000 ug | ORAL_TABLET | Freq: Every day | ORAL | 0 refills | Status: DC

## 2017-08-17 ENCOUNTER — Ambulatory Visit: Payer: Self-pay | Admitting: Internal Medicine

## 2017-08-23 ENCOUNTER — Encounter: Payer: Self-pay | Admitting: Gastroenterology

## 2017-08-23 ENCOUNTER — Ambulatory Visit: Payer: Medicaid Other | Admitting: Gastroenterology

## 2017-08-23 VITALS — BP 110/70 | HR 82 | Ht 67.0 in | Wt 243.0 lb

## 2017-08-23 DIAGNOSIS — R1030 Lower abdominal pain, unspecified: Secondary | ICD-10-CM | POA: Diagnosis not present

## 2017-08-23 DIAGNOSIS — K6289 Other specified diseases of anus and rectum: Secondary | ICD-10-CM | POA: Diagnosis not present

## 2017-08-23 DIAGNOSIS — R194 Change in bowel habit: Secondary | ICD-10-CM | POA: Diagnosis not present

## 2017-08-23 MED ORDER — PEG 3350-KCL-NABCB-NACL-NASULF 236 G PO SOLR: 4000.0000 mL | Freq: Once | ORAL | 0 refills | Status: AC

## 2017-08-23 NOTE — Patient Instructions (Signed)
You have been scheduled for a colonoscopy. Please follow written instructions given to you at your visit today.  Please pick up your prep supplies at the pharmacy within the next 1-3 days. If you use inhalers (even only as needed), please bring them with you on the day of your procedure. Your physician has requested that you go to www.startemmi.com and enter the access code given to you at your visit today. This web site gives a general overview about your procedure. However, you should still follow specific instructions given to you by our office regarding your preparation for the procedure.  Try a daily powder fiber such as Benefiber or Citrucel as directed.

## 2017-09-01 ENCOUNTER — Encounter: Payer: Self-pay | Admitting: Gastroenterology

## 2017-09-01 DIAGNOSIS — K6289 Other specified diseases of anus and rectum: Secondary | ICD-10-CM | POA: Insufficient documentation

## 2017-09-01 DIAGNOSIS — R194 Change in bowel habit: Secondary | ICD-10-CM | POA: Insufficient documentation

## 2017-09-01 DIAGNOSIS — R1030 Lower abdominal pain, unspecified: Secondary | ICD-10-CM | POA: Insufficient documentation

## 2017-09-01 NOTE — Progress Notes (Signed)
09/01/2017 Colleen Peck [id number] 1981/07/01   HISTORY OF PRESENT ILLNESS: This is a 37 year old black female who is new to our office and was referred here by her PCP, Dr. Juleen China, to "rule out IBS".  She tells me that she has been experiencing issues with her bowels, alternating constipation, and diarrhea, along with some lower abdominal pains and random/intermittent rectal pains since April of 2018.  Says that the rectal pain will wake her from sleep at times.  Denies rectal bleeding.   Past Medical History:  Diagnosis Date  . Abnormal pap    pt reports abnl pap many years ago.  Nl since then.  . Asthma   . Bipolar disorder ([place])   . Depression   . Palpitations 03/12/2008  . PTSD (post-traumatic stress disorder)   . Seasonal allergies   . Thyroid disease 2009   Graves disease (pt reported resolved); hypothyriodism   Past Surgical History:  Procedure Laterality Date  . DILATION AND CURETTAGE OF UTERUS  March 2006    reports that  has Peck smoked. she has Peck used smokeless tobacco. She reports that she uses drugs. Drug: Marijuana. She reports that she does not drink alcohol. family history includes Anxiety disorder in her maternal grandmother; Bipolar disorder in her cousin; COPD in her maternal grandmother and paternal grandmother; Depression in her cousin, maternal aunt, maternal aunt, maternal grandmother, and mother; Diabetes in her paternal grandfather; Drug abuse in her father; Hypertension in her mother; Suicidality in her cousin. Allergies  Allergen Reactions  . Abilify [Aripiprazole] Other (See Comments)    AKATHISIA      Outpatient Encounter Medications as of 08/23/2017  Medication Sig  . albuterol (PROVENTIL HFA;VENTOLIN HFA) 108 (90 Base) MCG/ACT inhaler Inhale 2 puffs into the lungs every 6 (six) hours as needed for wheezing or shortness of breath.  . beclomethasone (QVAR) 80 MCG/ACT inhaler Inhale 1 puff into the lungs 2 (two) times daily.  . hydrOXYzine  (ATARAX/VISTARIL) 25 MG tablet Take 1 tablet (25 mg total) by mouth every 6 (six) hours as needed for anxiety or itching.  Marland Kitchen ibuprofen (ADVIL,MOTRIN) 600 MG tablet Take 1 tablet (600 mg total) every 6 (six) hours as needed by mouth for moderate pain.  Marland Kitchen lamoTRIgine (LAMICTAL) 25 MG tablet Take 1 tablet (25 mg) in the morning & 2 tablets (50 mg) in the evening: For mood stabilization  . levothyroxine (SYNTHROID, LEVOTHROID) 112 MCG tablet Take 2 tablets (224 mcg total) by mouth at bedtime.  . meloxicam (MOBIC) 15 MG tablet Take 1 tablet (15 mg total) by mouth daily.  . predniSONE (DELTASONE) 10 MG tablet 10 day taper. 5,5,4,4,3,3,2,2,1,1  . QUEtiapine (SEROQUEL) 50 MG tablet Take 5 tablets (250 mg total) by mouth daily at 8 pm. For mood control  . docusate sodium (COLACE) 100 MG capsule Take 1 capsule (100 mg total) by mouth 2 (two) times daily. For constipation (Patient not taking: Reported on 08/23/2017)  . FLUoxetine (PROZAC) 20 MG capsule Take 1 capsule (20 mg total) by mouth daily. For depression (Patient not taking: Reported on 08/23/2017)  . [EXPIRED] polyethylene glycol (GOLYTELY) 236 g solution Take 4,000 mLs by mouth once for 1 dose.  . terbinafine (LAMISIL) 250 MG tablet Take 1 tablet (250 mg) PO once daily x6 weeks. (Patient not taking: Reported on 08/23/2017)  . valACYclovir (VALTREX) 1000 MG tablet Take 2 tablets (2,000 mg total) by mouth 2 (two) times daily. (Patient not taking: Reported on 08/23/2017)   No facility-administered  encounter medications on file as of 08/23/2017.      REVIEW OF SYSTEMS  : All other systems reviewed and negative except where noted in the History of Present Illness.   PHYSICAL EXAM: BP 110/70   Pulse 82   Ht 5\' 7"  (1.702 m)   Wt 243 lb (110.2 kg)   BMI 38.06 kg/m  General: Well developed black female in no acute distress Head: Normocephalic and atraumatic Eyes:  Sclerae anicteric, conjunctiva pink. Ears: Normal auditory acuity Lungs: Clear  throughout to auscultation; no increased WOB. Heart: Regular rate and rhythm; no M/R/G. Abdomen: Soft, non-distended.  BS present.  Mild lower abdominal TTP. Rectal: Will be done at the time of colonoscopy. Musculoskeletal: Symmetrical with no gross deformities  Skin: No lesions on visible extremities Extremities: No edema  Neurological: Alert oriented x 4, grossly non-focal Psychological:  Alert and cooperative. Normal mood and affect  ASSESSMENT AND PLAN: *37 year old AA female with complaints of change in bowel habits with alternating constipation and diarrhea/loose stools, lower abdominal pain, and rectal pain/discomfort.  She is definitely on several medications that can be constipating.  TSH was normal 03/2017.  Will schedule for colonoscopy to evaluate all of these symptoms, although I think that she likely has IBS.  I have asked her to begin taking a daily powder fiber supplement such as Benefiber or Citrucel.  **The risks, benefits, and alternatives to colonoscopy were discussed with the patient and she consents to proceed.    CC:  Colleen Peck*

## 2017-09-02 NOTE — Progress Notes (Signed)
I agree with the above note, plan 

## 2017-09-05 ENCOUNTER — Other Ambulatory Visit: Payer: Self-pay | Admitting: Podiatry

## 2017-09-05 ENCOUNTER — Ambulatory Visit: Payer: Medicaid Other | Admitting: Podiatry

## 2017-09-05 ENCOUNTER — Ambulatory Visit (INDEPENDENT_AMBULATORY_CARE_PROVIDER_SITE_OTHER): Payer: Medicaid Other

## 2017-09-05 DIAGNOSIS — M722 Plantar fascial fibromatosis: Secondary | ICD-10-CM

## 2017-09-05 DIAGNOSIS — M79671 Pain in right foot: Secondary | ICD-10-CM

## 2017-09-05 DIAGNOSIS — M7671 Peroneal tendinitis, right leg: Secondary | ICD-10-CM

## 2017-09-05 DIAGNOSIS — R600 Localized edema: Secondary | ICD-10-CM

## 2017-09-05 MED ORDER — METHYLPREDNISOLONE 4 MG PO TBPK: ORAL_TABLET | ORAL | 0 refills | Status: DC

## 2017-09-05 NOTE — Patient Instructions (Signed)

## 2017-09-07 NOTE — Progress Notes (Signed)
   HPI: 37 year old female presenting today as a new patient with a chief complaint of throbbing pain to the plantar heel and forefoot bilaterally that began suddenly about 5 months ago. Standing on flat surfaces increases the pain. She has been wearing OTC inserts with some relief. She states she is on her feet all day for work. Patient is here for further evaluation and treatment.   Past Medical History:  Diagnosis Date  . Abnormal pap    pt reports abnl pap many years ago.  Nl since then.  . Asthma   . Bipolar disorder ([place])   . Depression   . Palpitations 03/12/2008  . PTSD (post-traumatic stress disorder)   . Seasonal allergies   . Thyroid disease 2009   Graves disease (pt reported resolved); hypothyriodism     Physical Exam: General: The patient is alert and oriented x3 in no acute distress.  Dermatology: Skin is warm, dry and supple bilateral lower extremities. Negative for open lesions or macerations.  Vascular: Palpable pedal pulses bilaterally. No edema or erythema noted. Capillary refill within normal limits.  Neurological: Epicritic and protective threshold grossly intact bilaterally.   Musculoskeletal Exam: Pain with palpation to the peroneal tendons of the right foot. Range of motion within normal limits to all pedal and ankle joints bilateral. Muscle strength 5/5 in all groups bilateral.   Radiographic Exam:  Normal osseous mineralization. Joint spaces preserved. No fracture/dislocation/boney destruction.    Assessment: - peroneal tendinitis right    Plan of Care:  - Patient evaluated. X-Rays reviewed.  - Injection of 0.5 mLs Celestone Soluspan injected into the peroneal tendon sheath of the right foot. Care was taken to avoid direct injection into the tendons. - Prescription for Medrol Dose Pak provided to patient.  - Continue taking Meloxicam after Medrol completed.  - Recommended OTC ankle brace for right ankle.  - Continue wearing OTC insoles.  - Return to  clinic as needed.   Works at [employer] and is on her feet all day.    Edrick Kins, DPM [facility]  Dr. Edrick Kins, DPM    [address], [place] [address]                Office [phone]  Fax [phone]

## 2017-09-20 ENCOUNTER — Telehealth: Payer: Self-pay | Admitting: Podiatry

## 2017-09-20 NOTE — Telephone Encounter (Signed)
I saw Dr. Amalia Hailey on 25 February. He wrote me a prescription for prednisone. I went and got the prescription last week and somehow I misplaced it. I don't have it anymore. I was calling to see if he could write me another prescription and send it to my pharmacy over on [address] and I can just pick it up because I wasn't able to take the prescription he gave me. My number is [phone]. Thank you.

## 2017-09-21 NOTE — Telephone Encounter (Signed)
Left message informing pt the steroid dose pack had be reordered at the [facility].

## 2017-09-27 ENCOUNTER — Other Ambulatory Visit: Payer: Self-pay

## 2017-09-27 ENCOUNTER — Telehealth: Payer: Self-pay | Admitting: Gastroenterology

## 2017-09-27 DIAGNOSIS — E039 Hypothyroidism, unspecified: Secondary | ICD-10-CM

## 2017-09-27 MED ORDER — METHYLPREDNISOLONE 4 MG PO TBPK: ORAL_TABLET | ORAL | 0 refills | Status: DC

## 2017-09-27 NOTE — Telephone Encounter (Signed)
Instructions have been sent to the pt via My Chart as requested

## 2017-09-27 NOTE — Addendum Note (Signed)
Addended by: Harriett Sine D on: 09/27/2017 04:49 PM   Modules accepted: Orders

## 2017-09-27 NOTE — Telephone Encounter (Signed)
I spoke with pt and she states the medrol dose pack was not at the pharmacy. I reviewed the orders and they had not been sent. I apologized to pt and sent the refill immediately.

## 2017-09-27 NOTE — Telephone Encounter (Signed)
Patient states she lost her paper work for procedure this Friday 3.19.19 with Dr.Jacobs and wants to know if it can be sent through email or mychart. Can you please send this. Pt had ov with Jessica 2.12.19.

## 2017-09-28 ENCOUNTER — Other Ambulatory Visit: Payer: Self-pay | Admitting: *Deleted

## 2017-09-28 DIAGNOSIS — E039 Hypothyroidism, unspecified: Secondary | ICD-10-CM

## 2017-09-29 MED ORDER — LEVOTHYROXINE SODIUM 112 MCG PO TABS: 224.0000 ug | ORAL_TABLET | Freq: Every day | ORAL | 0 refills | Status: DC

## 2017-09-29 NOTE — Telephone Encounter (Signed)
LVM for pt to call the office. If pt calls, please schedule her an appt with Dr. Juleen China to discuss asthma medications. Ottis Stain, CMA

## 2017-09-29 NOTE — Telephone Encounter (Signed)
I have sent in refill for thyroid medication. She does need an appointment to discuss her asthma medications. Please schedule her. Thanks!   Phill Myron, D.O. 09/29/2017, 10:05 AM PGY-3, Colleen Peck

## 2017-09-30 ENCOUNTER — Encounter: Payer: Self-pay | Admitting: Gastroenterology

## 2017-09-30 ENCOUNTER — Ambulatory Visit (AMBULATORY_SURGERY_CENTER): Payer: Medicaid Other | Admitting: Gastroenterology

## 2017-09-30 VITALS — BP 122/66 | HR 68 | Temp 97.3°F | Resp 14 | Ht 67.0 in | Wt 243.0 lb

## 2017-09-30 DIAGNOSIS — R194 Change in bowel habit: Secondary | ICD-10-CM

## 2017-09-30 DIAGNOSIS — R1030 Lower abdominal pain, unspecified: Secondary | ICD-10-CM

## 2017-09-30 MED ORDER — SODIUM CHLORIDE 0.9 % IV SOLN: 500.0000 mL | Freq: Once | INTRAVENOUS | Status: DC

## 2017-09-30 NOTE — Progress Notes (Signed)
Report to PACU, RN, vss, BBS= Clear.  

## 2017-09-30 NOTE — Op Note (Signed)
[facility] Patient Name: Colleen Peck Procedure Date: 09/30/2017 3:46 PM MRN: [id number] Endoscopist: Milus Banister , MD Age: 37 Referring MD:  Date of Birth: Dec 30, 1980 Gender: Female Account #: [id number] Procedure:                Colonoscopy Indications:              Lower abdominal pain, Change in bowel habits Medicines:                Monitored Anesthesia Care Procedure:                Pre-Anesthesia Assessment:                           - Prior to the procedure, a History and Physical                            was performed, and patient medications and                            allergies were reviewed. The patient's tolerance of                            previous anesthesia was also reviewed. The risks                            and benefits of the procedure and the sedation                            options and risks were discussed with the patient.                            All questions were answered, and informed consent                            was obtained. Prior Anticoagulants: The patient has                            taken no previous anticoagulant or antiplatelet                            agents. ASA Grade Assessment: II - A patient with                            mild systemic disease. After reviewing the risks                            and benefits, the patient was deemed in                            satisfactory condition to undergo the procedure.                           After obtaining informed consent, the colonoscope  was passed under direct vision. Throughout the                            procedure, the patient's blood pressure, pulse, and                            oxygen saturations were monitored continuously. The                            Colonoscope was introduced through the anus and                            advanced to the the cecum, identified by                            appendiceal orifice and  ileocecal valve. The                            colonoscopy was performed without difficulty. The                            patient tolerated the procedure well. The quality                            of the bowel preparation was good. The ileocecal                            valve, appendiceal orifice, and rectum were                            photographed. Scope In: 3:48:26 PM Scope Out: 3:57:59 PM Scope Withdrawal Time: 0 hours 5 minutes 27 seconds  Total Procedure Duration: 0 hours 9 minutes 33 seconds  Findings:                 The entire examined colon appeared normal on direct                            and retroflexion views. Complications:            No immediate complications. Estimated blood loss:                            None. Estimated Blood Loss:     Estimated blood loss: none. Impression:               - The entire examined colon is normal on direct and                            retroflexion views.                           - No specimens collected. Recommendation:           - Patient has a contact number available for  emergencies. The signs and symptoms of potential                            delayed complications were discussed with the                            patient. Return to normal activities tomorrow.                            Written discharge instructions were provided to the                            patient.                           - Resume previous diet.                           - Continue present medications. Please try taking                            the benefiber every day for the next 3-4 weeks and                            call my office to report on your response.                           - Repeat colonoscopy at age 77 for screening. Milus Banister, MD 09/30/2017 4:01:46 PM This report has been signed electronically.

## 2017-09-30 NOTE — Telephone Encounter (Signed)
LVM to inform pt of below and to assist her in getting an appointment scheduled. Colleen Peck, Colleen Peck, [place]

## 2017-09-30 NOTE — Patient Instructions (Signed)
YOU HAD AN ENDOSCOPIC PROCEDURE TODAY AT [facility]:   Refer to the procedure report that was given to you for any specific questions about what was found during the examination.  If the procedure report does not answer your questions, please call your gastroenterologist to clarify.  If you requested that your care partner not be given the details of your procedure findings, then the procedure report has been included in a sealed envelope for you to review at your convenience later.  YOU SHOULD EXPECT: Some feelings of bloating in the abdomen. Passage of more gas than usual.  Walking can help get rid of the air that was put into your GI tract during the procedure and reduce the bloating. If you had a lower endoscopy (such as a colonoscopy or flexible sigmoidoscopy) you may notice spotting of blood in your stool or on the toilet paper. If you underwent a bowel prep for your procedure, you may not have a normal bowel movement for a few days.  Please Note:  You might notice some irritation and congestion in your nose or some drainage.  This is from the oxygen used during your procedure.  There is no need for concern and it should clear up in a day or so.  SYMPTOMS TO REPORT IMMEDIATELY:   Following lower endoscopy (colonoscopy or flexible sigmoidoscopy):  Excessive amounts of blood in the stool  Significant tenderness or worsening of abdominal pains  Swelling of the abdomen that is new, acute  Fever of 100F or higher   For urgent or emergent issues, a gastroenterologist can be reached at any hour by calling [phone].   DIET:  We do recommend a small meal at first, but then you may proceed to your regular diet.  Drink plenty of fluids but you should avoid alcoholic beverages for 24 hours. Use benefiber everyday to help with constipation problems. Drink plenty of water.  ACTIVITY:  You should plan to take it easy for the rest of today and you should NOT DRIVE or use heavy  machinery until tomorrow (because of the sedation medicines used during the test).    FOLLOW UP: Our staff will call the number listed on your records the next business day following your procedure to check on you and address any questions or concerns that you may have regarding the information given to you following your procedure. If we do not reach you, we will leave a message.  However, if you are feeling well and you are not experiencing any problems, there is no need to return our call.  We will assume that you have returned to your regular daily activities without incident.  If any biopsies were taken you will be contacted by phone or by letter within the next 1-3 weeks.  Please call us at [phone] if you have not heard about the biopsies in 3 weeks.    SIGNATURES/CONFIDENTIALITY: You and/or your care partner have signed paperwork which will be entered into your electronic medical record.  These signatures attest to the fact that that the information above on your After Visit Summary has been reviewed and is understood.  Full responsibility of the confidentiality of this discharge information lies with you and/or your care-partner.

## 2017-10-03 ENCOUNTER — Telehealth: Payer: Self-pay

## 2017-10-03 NOTE — Telephone Encounter (Signed)
Letter asking pt to call the office to schedule an appt has been printed and mailed. Ottis Stain, CMA

## 2017-10-03 NOTE — Telephone Encounter (Signed)
  Follow up Call-  Call back number 09/30/2017  Post procedure Call Back phone  # [phone]  Permission to leave phone message Yes  Some recent data might be hidden     Patient questions:  Do you have a fever, pain , or abdominal swelling? No. Pain Score  0 *  Have you tolerated food without any problems? Yes.    Have you been able to return to your normal activities? Yes.    Do you have any questions about your discharge instructions: Diet   No. Medications  No. Follow up visit  No.  Do you have questions or concerns about your Care? No.  Actions: * If pain score is 4 or above: No action needed, pain <4.

## 2017-10-17 ENCOUNTER — Telehealth: Payer: Self-pay | Admitting: Internal Medicine

## 2017-10-17 ENCOUNTER — Ambulatory Visit: Payer: Self-pay | Admitting: Internal Medicine

## 2017-10-17 NOTE — Telephone Encounter (Signed)
Pt called and had to cancel her appointment due to work schedule. She wanted the doctor to know that she was going to go to urgent care concerning her Asthma. She would like to be contacted to discuss her asthma and medications.

## 2017-10-28 ENCOUNTER — Emergency Department (HOSPITAL_COMMUNITY)
Admission: EM | Admit: 2017-10-28 | Discharge: 2017-10-28 | Disposition: A | Discharge status: Home / Self Care | Payer: Medicaid Other | Attending: Emergency Medicine | Admitting: Emergency Medicine

## 2017-10-28 ENCOUNTER — Emergency Department (HOSPITAL_COMMUNITY): Payer: Medicaid Other

## 2017-10-28 ENCOUNTER — Other Ambulatory Visit: Payer: Self-pay

## 2017-10-28 DIAGNOSIS — R062 Wheezing: Secondary | ICD-10-CM | POA: Diagnosis present

## 2017-10-28 DIAGNOSIS — Z5321 Procedure and treatment not carried out due to patient leaving prior to being seen by health care provider: Secondary | ICD-10-CM | POA: Insufficient documentation

## 2017-10-28 MED ORDER — ACETAMINOPHEN 325 MG PO TABS: 650.0000 mg | ORAL_TABLET | Freq: Once | ORAL | Status: DC

## 2017-10-28 NOTE — ED Notes (Signed)
This NT and Patient Advocate spoke to pt about her concerns with waiting, pt stated she might leave. This NT called pt name x3 to recheck vitals, no response from pt.

## 2017-10-28 NOTE — ED Notes (Signed)
Going to give Tylenol, pt. Stated, I have some I will just take my own.

## 2017-10-28 NOTE — ED Notes (Signed)
Pt. Requesting some tylenol.

## 2017-10-28 NOTE — ED Triage Notes (Signed)
Pt states she feels like she has bronchitis and has felt like she's wheezing. Pt also reports she has body aches all over with chills. Pts lungs are clear, in no distress.

## 2017-11-14 ENCOUNTER — Ambulatory Visit: Payer: Self-pay | Admitting: Internal Medicine

## 2017-11-24 ENCOUNTER — Other Ambulatory Visit: Payer: Self-pay

## 2017-11-24 ENCOUNTER — Encounter: Payer: Self-pay | Admitting: Internal Medicine

## 2017-11-24 ENCOUNTER — Ambulatory Visit: Payer: Medicaid Other | Admitting: Internal Medicine

## 2017-11-24 VITALS — BP 107/78 | HR 67 | Temp 98.3°F | Ht 67.0 in | Wt 248.0 lb

## 2017-11-24 DIAGNOSIS — J45909 Unspecified asthma, uncomplicated: Secondary | ICD-10-CM

## 2017-11-24 DIAGNOSIS — Z124 Encounter for screening for malignant neoplasm of cervix: Secondary | ICD-10-CM

## 2017-11-24 MED ORDER — LAMOTRIGINE 200 MG PO TABS: 200.0000 mg | ORAL_TABLET | Freq: Every day | ORAL | 11 refills | Status: DC

## 2017-11-24 MED ORDER — ALBUTEROL SULFATE (2.5 MG/3ML) 0.083% IN NEBU: 2.5000 mg | INHALATION_SOLUTION | Freq: Four times a day (QID) | RESPIRATORY_TRACT | 1 refills | Status: DC | PRN

## 2017-11-24 MED ORDER — BECLOMETHASONE DIPROPIONATE 80 MCG/ACT IN AERS: 1.0000 | INHALATION_SPRAY | Freq: Two times a day (BID) | RESPIRATORY_TRACT | 11 refills | Status: DC

## 2017-11-24 MED ORDER — ALBUTEROL SULFATE HFA 108 (90 BASE) MCG/ACT IN AERS: 2.0000 | INHALATION_SPRAY | Freq: Four times a day (QID) | RESPIRATORY_TRACT | 1 refills | Status: DC | PRN

## 2017-11-24 NOTE — Progress Notes (Signed)
   Subjective:    Colleen Peck - 37 y.o. female MRN [id number]  Date of birth: 08-27-1980  HPI  Colleen Peck is here for asthma.  Asthma: Reports that she recently had a worsening of asthma with the change in seasons. She is now feeling better. Takes Qvar intermittently, says she does not need it frequently because her asthma rarely gets bad like this. Does request refill for albuterol as well. Using Albuterol 3-4 times during the week and 3-4 nights per week as well. Denies current cough, fevers, respiratory distress, rhinorrhea.   -  reports that she has never smoked. She has never used smokeless tobacco. - Review of Systems: Per HPI. - Past Medical History: Patient Active Problem List   Diagnosis Date Noted  . Change in bowel habits 09/01/2017  . Rectal pain 09/01/2017  . Lower abdominal pain 09/01/2017  . Cold sore 08/08/2017  . Dermatofibroma of right lower leg 01/06/2017  . History of borderline personality disorder 01/01/2016  . Carotidynia 12/16/2015  . Schizoaffective disorder, bipolar type ([place]) 11/26/2015  . Anxiety   . Pain of left calf 11/10/2015  . PTSD (post-traumatic stress disorder) 09/30/2015  . Medical clearance for psychiatric admission   . Paranoia ([place])   . Anemia 01/30/2015  . Unspecified vitamin D deficiency 05/22/2013  . Hypothyroidism (acquired) 11/29/2011  . Obesity 09/08/2006  . RHINITIS, ALLERGIC 09/08/2006  . Asthma 09/08/2006  . ECZEMA, ATOPIC DERMATITIS 09/08/2006   - Medications: reviewed and updated   Objective:   Physical Exam BP 107/78   Pulse 67   Temp 98.3 F (36.8 C) (Oral)   Ht 5\' 7"  (1.702 m)   Wt 248 lb (112.5 kg)   SpO2 98%   BMI 38.84 kg/m  Gen: NAD, alert, cooperative with exam, well-appearing CV: RRR, good S1/S2, no murmur, no edema, capillary refill brisk  Resp: CTABL, no wheezes, non-labored     Assessment & Plan:   1. Screening for cervical cancer Patient requests referral for Ob-GYN to complete her PAP.  -  Ambulatory referral to Obstetrics / Gynecology  2. Asthma, unspecified asthma severity, unspecified whether complicated, unspecified whether persistent  Discussed that patient would likely benefit from daily compliance with ICS to prevent and lessen severity of asthma exacerbations. No evidence of acute exacerbation at present. If she continues to need albuterol as frequently or has another exacerbation despite compliance with Qvar, would likely benefit from additional therapy.  - albuterol (PROVENTIL HFA;VENTOLIN HFA) 108 (90 Base) MCG/ACT inhaler; Inhale 2 puffs into the lungs every 6 (six) hours as needed for wheezing or shortness of breath.  Dispense: 18 g; Refill: 1 - beclomethasone (QVAR) 80 MCG/ACT inhaler; Inhale 1 puff into the lungs 2 (two) times daily.  Dispense: 1 Inhaler; Refill: 11 - albuterol (PROVENTIL) (2.5 MG/3ML) 0.083% nebulizer solution; Take 3 mLs (2.5 mg total) by nebulization every 6 (six) hours as needed for wheezing or shortness of breath.  Dispense: 150 mL; Refill: [place], D.O. 11/24/2017, 4:03 PM PGY-3, [facility]

## 2017-11-24 NOTE — Patient Instructions (Signed)
I have placed the referral to Ob-GYN for your PAP smear.   I have refilled your asthma medications. Please try to use the Qvar every day. Your lungs sound great right now.   Once you have the name of the dermatologist, you can call back in for a referral.

## 2017-11-25 ENCOUNTER — Telehealth: Payer: Self-pay

## 2017-11-25 NOTE — Telephone Encounter (Signed)
[facility] calling regarding patients lamictal prescription- they are need clarigication of the dosage and directions. Written for 200mg  tabs- take 25 in am 50 pm  Their call back [phone] Wallace Cullens, RN

## 2017-11-25 NOTE — Telephone Encounter (Signed)
Received fax from [facility] requesting prior authorization of Qvar .  Form placed in MD's box for completion along with Medicaid formulary.  Wallace Cullens, RN

## 2017-11-28 NOTE — Telephone Encounter (Signed)
I do not prescribe patient's Lamictal. If that script was sent, it was sent in error and should not be filled under my name.   Phill Myron, D.O. 11/28/2017, 1:42 PM PGY-3, [facility]

## 2017-12-15 ENCOUNTER — Other Ambulatory Visit: Payer: Self-pay

## 2017-12-15 ENCOUNTER — Encounter: Payer: Self-pay | Admitting: Family Medicine

## 2017-12-15 ENCOUNTER — Ambulatory Visit: Payer: Medicaid Other | Admitting: Family Medicine

## 2017-12-15 DIAGNOSIS — L509 Urticaria, unspecified: Secondary | ICD-10-CM

## 2017-12-15 DIAGNOSIS — F411 Generalized anxiety disorder: Secondary | ICD-10-CM

## 2017-12-15 MED ORDER — HYDROXYZINE HCL 25 MG PO TABS: 25.0000 mg | ORAL_TABLET | Freq: Four times a day (QID) | ORAL | 2 refills | Status: DC | PRN

## 2017-12-15 NOTE — Patient Instructions (Signed)
Hives Hives (urticaria) are itchy, red, swollen areas on your skin. Hives can show up on any part of your body, and they can vary in size. They can be as small as the tip of a pen or much larger. Hives often fade within 24 hours (acute hives). In other cases, new hives show up after old ones fade. This can continue for many days or weeks (chronic hives). Hives are caused by your body's reaction to an irritant or to something that you are allergic to (trigger). You can get hives right after being around a trigger or hours later. Hives do not spread from person to person (are not contagious). Hives may get worse if you scratch them, if you exercise, or if you have worries (emotional stress). Follow these instructions at home: Medicines  Take or apply over-the-counter and prescription medicines only as told by your doctor.  If you were prescribed an antibiotic medicine, use it as told by your doctor. Do not stop taking the antibiotic even if you start to feel better. Skin Care  Apply cool, wet cloths (cool compresses) to the itchy, red, swollen areas.  Do not scratch your skin. Do not rub your skin. General instructions  Do not take hot showers or baths. This can make itching worse.  Do not wear tight clothes.  Use sunscreen and wear clothing that covers your skin when you are outside.  Avoid any triggers that cause your hives. Keep a journal to help you keep track of what causes your hives. Write down: ? What medicines you take. ? What you eat and drink. ? What products you use on your skin.  Keep all follow-up visits as told by your doctor. This is important. Contact a doctor if:  Your symptoms are not better with medicine.  Your joints are painful or swollen. Get help right away if:  You have a fever.  You have belly pain.  Your tongue or lips are swollen.  Your eyelids are swollen.  Your chest or throat feels tight.  You have trouble breathing or swallowing. These  symptoms may be an emergency. Do not wait to see if the symptoms will go away. Get medical help right away. Call your local emergency services (911 in the [place] Do not drive yourself to the hospital. This information is not intended to replace advice given to you by your health care provider. Make sure you discuss any questions you have with your health care provider. Document Released: 04/06/2008 Document Revised: 12/04/2015 Document Reviewed: 04/16/2015 Elsevier Interactive Patient Education  2018 Elsevier Inc.  

## 2017-12-15 NOTE — Assessment & Plan Note (Signed)
Vistaril should help.

## 2017-12-15 NOTE — Progress Notes (Signed)
   Subjective:    Patient ID: Colleen Peck is a 37 y.o. female presenting with No chief complaint on file.  on 12/15/2017  HPI: 3 d h/o inflammation on her legs and arms. Areas are itchy.  Has h/o hives thought to be related to citrus fruits. Also under increased stress due to work. Has h/o undiagnosed graves disease. No new detergents. Started using lavendar and lemongrass in her body butter. Wonders if it is insect bites.  Review of Systems  Constitutional: Negative for chills and fever.  Respiratory: Negative for shortness of breath.   Cardiovascular: Negative for chest pain.  Gastrointestinal: Negative for abdominal pain, nausea and vomiting.  Genitourinary: Negative for dysuria.  Skin: Negative for rash.      Objective:    BP 110/75   Pulse 82   Temp 98.4 F (36.9 C) (Oral)   LMP 11/18/2017   SpO2 98%  Physical Exam  Constitutional: She is oriented to person, place, and time. She appears well-developed and well-nourished. No distress.  HENT:  Head: Normocephalic and atraumatic.  Eyes: No scleral icterus.  Neck: Neck supple.  Cardiovascular: Normal rate.  Pulmonary/Chest: Effort normal.  Abdominal: Soft.  Neurological: She is alert and oriented to person, place, and time.  Skin: Skin is warm and dry.  multiple raised erythematous lesions  Psychiatric: She has a normal mood and affect.        Assessment & Plan:   Problem List Items Addressed This Visit      Unprioritized   Urticaria    Vistaril should help.       Other Visit Diagnoses    Generalized anxiety disorder       Relevant Medications   hydrOXYzine (ATARAX/VISTARIL) 25 MG tablet      Total face-to-face time with patient: 15 minutes. Over 50% of encounter was spent on counseling and coordination of care. Return if symptoms worsen or fail to improve.  Donnamae Jude 12/15/2017 3:52 PM

## 2017-12-16 ENCOUNTER — Other Ambulatory Visit: Payer: Self-pay | Admitting: Internal Medicine

## 2017-12-16 MED ORDER — FLUTICASONE PROPIONATE HFA 110 MCG/ACT IN AERO: 1.0000 | INHALATION_SPRAY | Freq: Two times a day (BID) | RESPIRATORY_TRACT | 12 refills | Status: DC

## 2017-12-16 NOTE — Progress Notes (Signed)
I have sent Flovent in place of Qvar since it is on the preferred formulary list.   Phill Myron, D.O. 12/16/2017, 1:15 PM PGY-3, [facility]

## 2017-12-16 NOTE — Telephone Encounter (Signed)
Alternative was sent. Danley Danker, RN [facility] RN)

## 2018-01-24 ENCOUNTER — Other Ambulatory Visit: Payer: Self-pay

## 2018-01-24 ENCOUNTER — Encounter: Payer: Self-pay | Admitting: Family Medicine

## 2018-01-24 ENCOUNTER — Encounter: Payer: Self-pay | Admitting: Obstetrics & Gynecology

## 2018-01-24 ENCOUNTER — Ambulatory Visit (INDEPENDENT_AMBULATORY_CARE_PROVIDER_SITE_OTHER): Payer: Medicare Other | Admitting: Family Medicine

## 2018-01-24 VITALS — BP 102/70 | HR 87 | Temp 98.2°F | Wt 244.0 lb

## 2018-01-24 DIAGNOSIS — N898 Other specified noninflammatory disorders of vagina: Secondary | ICD-10-CM | POA: Diagnosis present

## 2018-01-24 LAB — POCT WET PREP (WET MOUNT)
Clue Cells Wet Prep Whiff POC: NEGATIVE
TRICHOMONAS WET PREP HPF POC: ABSENT

## 2018-01-24 NOTE — Patient Instructions (Signed)
You were seen in clinic today for vaginal dryness and irritation.  We performed a wet prep to check for a yeast or bacterial infection.  I will call you once I have the results of these tests and will also call in a medication to your pharmacy if needed.  Please call clinic if you have any questions.  Be well, Lovenia Kim MD

## 2018-01-24 NOTE — Progress Notes (Signed)
   Subjective:   Patient ID: Colleen Peck    DOB: 1980/12/20, 37 y.o. female   MRN: [id number]  CC: vaginal dryness/itching  HPI: Colleen Peck is a 37 y.o. female who presents to clinic today for the following issue.  Vaginal itching Patient states every time her menstrual cycle comes around she experiences vaginal irritation and itching.  She is concerned that this is a yeast or bacterial infection.  She has been using Monistat but no improvement in symptoms.  She was recently tested for a UTI in March which was negative.  She has not noticed any abnormal discharge or color.  No known exposure to STDs or new sexual partners.  She is sexually active with males only but she denies being sexually active recently.  Last sexual contact was about 8 years ago.  No abnormal bleeding.  Denies burning with urination.  No urinary frequency or urgency noted.  No fever, chills, nausea, vomiting.  No abdominal pain.  ROS: See HPI for pertinent ROS.  Social: pt is a never smoker.  Medications reviewed. Objective:   BP 102/70   Pulse 87   Temp 98.2 F (36.8 C) (Oral)   Wt 244 lb (110.7 kg)   LMP 12/27/2017   SpO2 96%   BMI 38.22 kg/m  Vitals and nursing note reviewed.  General: Well-appearing 37 year old female, NAD CV: regular rate and rhythm without murmurs rubs or gallops Lungs: clear to auscultation bilaterally with normal work of breathing Abdomen: soft, positive bowel sounds Pelvic exam: VULVA: normal appearing vulva with no masses, tenderness or lesions, VAGINA: normal appearing vagina with normal color and discharge, no lesions, CERVIX: normal appearing cervix without discharge or lesions. Skin: warm, dry, no rassh Extremities: warm and well perfused, normal tone  Assessment & Plan:   Candidiasis Patient reports 1 week history of vaginal itching and irritation.  She has not had any recent exposure to STDs, stating her last sexual contact was about 8 years ago.  Possible this is  recurrent yeast infection versus BV. - Wet prep positive for yeast, will send in Diflucan x2 tabs -She declines STD testing including HIV, RPR, GC chlamydia -safe sex counseling provided  Orders Placed This Encounter  Procedures  . POCT Wet Prep [facility])   Meds ordered this encounter  Medications  . fluconazole (DIFLUCAN) 150 MG tablet    Sig: Take 1 tablet (150 mg total) by mouth once for 1 dose.    Dispense:  2 tablet    Refill:  0    Lovenia Kim, MD [facility], PGY-3

## 2018-02-02 MED ORDER — FLUCONAZOLE 150 MG PO TABS: 150.0000 mg | ORAL_TABLET | Freq: Once | ORAL | 0 refills | Status: AC

## 2018-02-28 ENCOUNTER — Other Ambulatory Visit: Payer: Self-pay | Admitting: Obstetrics and Gynecology

## 2018-03-06 ENCOUNTER — Ambulatory Visit: Payer: Self-pay | Admitting: Obstetrics & Gynecology

## 2018-03-21 ENCOUNTER — Ambulatory Visit (INDEPENDENT_AMBULATORY_CARE_PROVIDER_SITE_OTHER): Payer: Medicare Other | Admitting: Family Medicine

## 2018-03-21 ENCOUNTER — Other Ambulatory Visit: Payer: Self-pay

## 2018-03-21 ENCOUNTER — Encounter: Payer: Self-pay | Admitting: Family Medicine

## 2018-03-21 VITALS — BP 110/70 | HR 68 | Temp 98.6°F | Ht 67.0 in | Wt 245.0 lb

## 2018-03-21 DIAGNOSIS — E039 Hypothyroidism, unspecified: Secondary | ICD-10-CM | POA: Diagnosis present

## 2018-03-21 DIAGNOSIS — R202 Paresthesia of skin: Secondary | ICD-10-CM

## 2018-03-21 DIAGNOSIS — L659 Nonscarring hair loss, unspecified: Secondary | ICD-10-CM

## 2018-03-21 DIAGNOSIS — E559 Vitamin D deficiency, unspecified: Secondary | ICD-10-CM

## 2018-03-21 DIAGNOSIS — D649 Anemia, unspecified: Secondary | ICD-10-CM | POA: Diagnosis not present

## 2018-03-21 DIAGNOSIS — F319 Bipolar disorder, unspecified: Secondary | ICD-10-CM

## 2018-03-21 DIAGNOSIS — F250 Schizoaffective disorder, bipolar type: Secondary | ICD-10-CM

## 2018-03-21 DIAGNOSIS — J45909 Unspecified asthma, uncomplicated: Secondary | ICD-10-CM

## 2018-03-21 DIAGNOSIS — R200 Anesthesia of skin: Secondary | ICD-10-CM | POA: Insufficient documentation

## 2018-03-21 MED ORDER — ALBUTEROL SULFATE HFA 108 (90 BASE) MCG/ACT IN AERS: 2.0000 | INHALATION_SPRAY | Freq: Four times a day (QID) | RESPIRATORY_TRACT | 1 refills | Status: DC | PRN

## 2018-03-21 NOTE — Assessment & Plan Note (Signed)
Suspect secondary to vitamin B12 or folate deficiency.  No concern for sensory deficits or associated weakness.  Patient currently taking prenatal vitamins. - Obtain Vitamin B12, Folate today - will consider supplementation pending results

## 2018-03-21 NOTE — Assessment & Plan Note (Addendum)
Follows with psychiatry.  Well-controlled on Lamictal. - continue management per psychiatry - will obtain CMP today for monitoring while on Lamictal

## 2018-03-21 NOTE — Assessment & Plan Note (Signed)
Has remote history of Vitamin D deficiency.  Not on supplementation.  Notes chronic fatigue. - Obtain Vitamin D today - consider supplementation pending results

## 2018-03-21 NOTE — Patient Instructions (Addendum)
It was wonderful to see you today.  Thank you for choosing [facility].   Please call [phone] with any questions about today's appointment.  Please be sure to schedule follow up at the front  desk before you leave today.   Best, Dr. Jerilynn Mages

## 2018-03-21 NOTE — Assessment & Plan Note (Signed)
Has a remote history of anemia and is requesting ferritin level be checked.  Also notes chronic fatigue. - Obtain CBC, Ferritin today - consider iron supplementation as needed

## 2018-03-21 NOTE — Assessment & Plan Note (Addendum)
Last TSH 4.3 on 03/18/2017.  Takes Synthroid 224 mcg QHS. Patient requesting "more specific thyroid labs." - Obtain TSH, free T4 -  Educated patient on need for rechecking TSH.  Noted that Antibodies are not necessary at this time as they will not change her management.  - cont Synthroid 238mcg QD pending TSH

## 2018-03-21 NOTE — Progress Notes (Signed)
Subjective: Chief Complaint  Patient presents with  . bloodwork     HPI: Colleen Peck is a 37 y.o. presenting to clinic today to discuss the following:  1 Hypothyroidism Patient has a history of hypothyroidism for many years and has been on Synthroid 21mcg QD.  Her last TSH was 4.3 on 03/18/2017.  She notes that she would like "more specific lab work done" today for her thyroid.  She notes a constant state of fatigue for many years.  She also notes heat intolerance.  She has recently experienced weight loss following changes in her psychiatric medications.  Denies polyuria, polydipsia, goiter, thyroid nodules, rashes.  Does note chronic hair loss/thinning and numbness and tingling in her feet and hands.  2 Numbness and Tingling  She notes that bilateral hands and feet have had occasional numbness and tingling for over a year.  She notes that she is able to walk without difficulty.  States that it happens at various times throughout the day.  Notes occasional swelling of her feet after standing on them at work for 10 hours.  Follows with a podiatrist for "sensitive feet and a callus on the bottom of my foot that she is burning off."  Denies balance difficulties, denies pain or weakness associated with the numbness and tingling.  Has not noted that anything improves of worsens the sensation.  3 Hair Loss She has noticed hair thinning and loss around her hairline over many years.  She states that she has been using Vitamin E topically, taking Prenatal Vitamins, Collagen, and biotin with some improvement.  Denies hair falling out in clumps.  She notes "I found a group of people on the internet with this problem too and they said I need more specific lab tests like CMV, Hemoglobin A1c, Thyroid Antibodies, and Vitamins."  4 Bipolar Disorder  Follows with psychiatry in [place], has been well controlled on Lamictal.  5 Asthma Uses albuterol inhaler prn, used once last week.  Does not  report regular use.  Requests refill on albuterol inhaler today.  6 Hx Vitamin D Deficiency Has remote history of vitamin D deficiency.  Last in chart 05/2013 at 29.  Not currently on replacement therapy.  Takes multivitamin. Complains of chronic fatigue.  Health Maintenance: Refuses flu shot, received Pap from OB/GYN this year per patient     ROS noted in HPI.   Past Medical, Surgical, Social, and Family History Reviewed & Updated per EMR.   Pertinent Historical Findings include:   Social History   Tobacco Use  Smoking Status Never Smoker  Smokeless Tobacco Never Used      Objective: BP 110/70   Pulse 68   Temp 98.6 F (37 C) (Oral)   Ht 5\' 7"  (1.702 m)   Wt 245 lb (111.1 kg)   LMP 02/21/2018   SpO2 98%   BMI 38.37 kg/m  Vitals and nursing notes reviewed  Physical Exam  Constitutional: She appears well-developed and well-nourished. No distress.  HENT:  Head: Normocephalic and atraumatic.  Neck: Neck supple. No thyromegaly present.  Cardiovascular: Normal rate, regular rhythm and normal heart sounds.  No murmur heard. 2+ pulses radial and dorsalis pedis  Pulmonary/Chest: Effort normal. No respiratory distress. She has no wheezes. She has no rales.  Musculoskeletal: She exhibits no edema or tenderness.  Negative monofilament exam BLE  Lymphadenopathy:    She has no cervical adenopathy.  Neurological: No sensory deficit. She exhibits normal muscle tone.  Skin: Skin is warm  and dry. No rash noted.     No results found for this or any previous visit (from the past 72 hour(s)).  Assessment/Plan:  Hypothyroidism (acquired) Last TSH 4.3 on 03/18/2017.  Takes Synthroid 224 mcg QHS. Patient requesting "more specific thyroid labs." - Obtain TSH, free T4 -  Educated patient on need for rechecking TSH.  Noted that Antibodies are not necessary at this time as they will not change her management.  - cont Synthroid 269mcg QD pending TSH  Vitamin D deficiency Has remote  history of Vitamin D deficiency.  Not on supplementation.  Notes chronic fatigue. - Obtain Vitamin D today - consider supplementation pending results  Anemia Has a remote history of anemia and is requesting ferritin level be checked.  Also notes chronic fatigue. - Obtain CBC, Ferritin today - consider iron supplementation as needed  Schizoaffective disorder, bipolar type ([place]) Follows with psychiatry.  Well-controlled on Lamictal. - continue management per psychiatry - will obtain CMP today for monitoring while on Lamictal  Hair loss History of chronic hair thinning.  Suspect secondary to thyroid disorder.  Improving with topical treatment and vitamin supplementation. - cont supplements - Obtain TSH, free T4 - Adjust synthroid as needed pending TSH - patient educated that CMV is not indicated at this time and that management will likely be based on her thyroid function tests - patient also educated that Hgb A1c is not indicated at this time, will assess need for A1c in the future following blood glucose levels on CMP.  Random blood glucose >200 would suggest diabetes.  Numbness and tingling of both feet Suspect secondary to vitamin B12 or folate deficiency.  No concern for sensory deficits or associated weakness.  Patient currently taking prenatal vitamins. - Obtain Vitamin B12, Folate today - will consider supplementation pending results  Asthma Well controlled on current regimen per patient. - refill albuterol inhaler for use 2 puffs q6h prn - cont flovent 1 puff BID     PATIENT EDUCATION PROVIDED: See AVS    Diagnosis and plan along with any newly prescribed medication(s) were discussed in detail with this patient today. The patient verbalized understanding and agreed with the plan. Patient advised if symptoms worsen return to clinic or ER.   Health Maintainance: UTD on Pap, refuses flu vaccine   Orders Placed This Encounter  Procedures  . TSH  . Vitamin B12  . Folate   . T4, Free  . Ferritin  . Comprehensive metabolic panel  . CBC  . Vitamin D, 25-hydroxy    Meds ordered this encounter  Medications  . albuterol (PROVENTIL HFA;VENTOLIN HFA) 108 (90 Base) MCG/ACT inhaler    Sig: Inhale 2 puffs into the lungs every 6 (six) hours as needed for wheezing or shortness of breath.    Dispense:  18 g    Refill:  Ulysses, DO 03/21/2018, 6:50 PM PGY-1 [facility]

## 2018-03-21 NOTE — Assessment & Plan Note (Addendum)
History of chronic hair thinning.  Suspect secondary to thyroid disorder.  Improving with topical treatment and vitamin supplementation. - cont supplements - Obtain TSH, free T4 - Adjust synthroid as needed pending TSH - patient educated that CMV is not indicated at this time and that management will likely be based on her thyroid function tests - patient also educated that Hgb A1c is not indicated at this time, will assess need for A1c in the future following blood glucose levels on CMP.  Random blood glucose >200 would suggest diabetes.

## 2018-03-21 NOTE — Assessment & Plan Note (Signed)
Well controlled on current regimen per patient. - refill albuterol inhaler for use 2 puffs q6h prn - cont flovent 1 puff BID

## 2018-03-22 ENCOUNTER — Other Ambulatory Visit: Payer: Self-pay | Admitting: Family Medicine

## 2018-03-22 DIAGNOSIS — E559 Vitamin D deficiency, unspecified: Secondary | ICD-10-CM

## 2018-03-22 LAB — T4, FREE: FREE T4: 1.56 ng/dL (ref 0.82–1.77)

## 2018-03-22 LAB — COMPREHENSIVE METABOLIC PANEL
A/G RATIO: 1.8 (ref 1.2–2.2)
ALT: 26 IU/L (ref 0–32)
AST: 27 IU/L (ref 0–40)
Albumin: 4.7 g/dL (ref 3.5–5.5)
Alkaline Phosphatase: 57 IU/L (ref 39–117)
BUN/Creatinine Ratio: 14 (ref 9–23)
BUN: 12 mg/dL (ref 6–20)
Bilirubin Total: 0.2 mg/dL (ref 0.0–1.2)
CO2: 23 mmol/L (ref 20–29)
Calcium: 9.5 mg/dL (ref 8.7–10.2)
Chloride: 101 mmol/L (ref 96–106)
Creatinine, Ser: 0.85 mg/dL (ref 0.57–1.00)
GFR calc non Af Amer: 89 mL/min/{1.73_m2} (ref >59)
GFR, EST AFRICAN AMERICAN: 103 mL/min/{1.73_m2} (ref >59)
GLUCOSE: 76 mg/dL (ref 65–99)
Globulin, Total: 2.6 g/dL (ref 1.5–4.5)
POTASSIUM: 3.9 mmol/L (ref 3.5–5.2)
Sodium: 139 mmol/L (ref 134–144)
Total Protein: 7.3 g/dL (ref 6.0–8.5)

## 2018-03-22 LAB — VITAMIN B12: Vitamin B-12: 886 pg/mL (ref 232–1245)

## 2018-03-22 LAB — CBC
HEMATOCRIT: 34.2 % (ref 34.0–46.6)
Hemoglobin: 10.7 g/dL — ABNORMAL LOW (ref 11.1–15.9)
MCH: 25.7 pg — ABNORMAL LOW (ref 26.6–33.0)
MCHC: 31.3 g/dL — AB (ref 31.5–35.7)
MCV: 82 fL (ref 79–97)
Platelets: 323 10*3/uL (ref 150–450)
RBC: 4.16 x10E6/uL (ref 3.77–5.28)
RDW: 14.8 % (ref 12.3–15.4)
WBC: 7.1 10*3/uL (ref 3.4–10.8)

## 2018-03-22 LAB — VITAMIN D 25 HYDROXY (VIT D DEFICIENCY, FRACTURES): VIT D 25 HYDROXY: 25 ng/mL — AB (ref 30.0–100.0)

## 2018-03-22 LAB — TSH: TSH: 1.32 u[IU]/mL (ref 0.450–4.500)

## 2018-03-22 LAB — FERRITIN: Ferritin: 24 ng/mL (ref 15–150)

## 2018-03-22 LAB — FOLATE: Folate: >20 ng/mL (ref >3.0)

## 2018-03-22 MED ORDER — CHOLECALCIFEROL 20 MCG (800 UNIT) PO TABS: 800.0000 [IU] | ORAL_TABLET | Freq: Every day | ORAL | 3 refills | Status: DC

## 2018-04-17 ENCOUNTER — Telehealth: Payer: Self-pay

## 2018-04-17 ENCOUNTER — Other Ambulatory Visit: Payer: Self-pay | Admitting: Family Medicine

## 2018-04-17 DIAGNOSIS — L659 Nonscarring hair loss, unspecified: Secondary | ICD-10-CM

## 2018-04-17 NOTE — Telephone Encounter (Signed)
Referral placed. Please let patient know. Thanks!

## 2018-04-17 NOTE — Telephone Encounter (Signed)
Pt calling to get a referral to [facility]. Pt stated that she had some lab work done and would like to get a second opinion. Pt stated that she does not want to see our Derm clinic Dr. Please let pt know if and when this is done. Pt can be reached [phone]. Ottis Stain, CMA

## 2018-04-17 NOTE — Progress Notes (Signed)
I believe hair loss likely 2/2 hypothyroidism that is being addressed and reassured as it is improving.  Patient requesting second opinion by [facility].  [place] Constable, D.O.  PGY-1 Family Medicine  04/17/2018 8:41 PM

## 2018-04-18 NOTE — Telephone Encounter (Signed)
Pt informed. Colleen Peck T Jillana Selph, CMA  

## 2018-05-13 ENCOUNTER — Encounter (HOSPITAL_BASED_OUTPATIENT_CLINIC_OR_DEPARTMENT_OTHER): Payer: Self-pay | Admitting: Emergency Medicine

## 2018-05-13 ENCOUNTER — Other Ambulatory Visit: Payer: Self-pay

## 2018-05-13 ENCOUNTER — Emergency Department (HOSPITAL_BASED_OUTPATIENT_CLINIC_OR_DEPARTMENT_OTHER)
Admission: EM | Admit: 2018-05-13 | Discharge: 2018-05-13 | Disposition: A | Discharge status: Home / Self Care | Payer: Medicare Other | Attending: Emergency Medicine | Admitting: Emergency Medicine

## 2018-05-13 ENCOUNTER — Emergency Department (HOSPITAL_BASED_OUTPATIENT_CLINIC_OR_DEPARTMENT_OTHER): Payer: Medicare Other

## 2018-05-13 DIAGNOSIS — Z79899 Other long term (current) drug therapy: Secondary | ICD-10-CM | POA: Insufficient documentation

## 2018-05-13 DIAGNOSIS — E039 Hypothyroidism, unspecified: Secondary | ICD-10-CM | POA: Insufficient documentation

## 2018-05-13 DIAGNOSIS — Y92410 Unspecified street and highway as the place of occurrence of the external cause: Secondary | ICD-10-CM | POA: Diagnosis not present

## 2018-05-13 DIAGNOSIS — S161XXA Strain of muscle, fascia and tendon at neck level, initial encounter: Secondary | ICD-10-CM | POA: Diagnosis not present

## 2018-05-13 DIAGNOSIS — Y999 Unspecified external cause status: Secondary | ICD-10-CM | POA: Insufficient documentation

## 2018-05-13 DIAGNOSIS — S199XXA Unspecified injury of neck, initial encounter: Secondary | ICD-10-CM | POA: Diagnosis present

## 2018-05-13 DIAGNOSIS — Y9389 Activity, other specified: Secondary | ICD-10-CM | POA: Insufficient documentation

## 2018-05-13 DIAGNOSIS — T148XXA Other injury of unspecified body region, initial encounter: Secondary | ICD-10-CM

## 2018-05-13 MED ORDER — HYDROCODONE-ACETAMINOPHEN 5-325 MG PO TABS
1.0000 | ORAL_TABLET | Freq: Once | ORAL | Status: AC
  Administered 2018-05-13: 1 via ORAL
  Filled 2018-05-13: qty 1

## 2018-05-13 MED ORDER — METHOCARBAMOL 500 MG PO TABS: 500.0000 mg | ORAL_TABLET | Freq: Two times a day (BID) | ORAL | 0 refills | Status: DC
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
## 2018-05-13 NOTE — Discharge Instructions (Signed)

## 2018-05-16 ENCOUNTER — Telehealth: Payer: Self-pay | Admitting: Family Medicine

## 2018-05-16 NOTE — Telephone Encounter (Signed)
Spoke with pt reminding them of/confirming their appt for tomorrow on 05/17/2018. -CH °

## 2018-05-17 ENCOUNTER — Encounter: Payer: Self-pay | Admitting: Family Medicine

## 2018-05-17 ENCOUNTER — Ambulatory Visit (INDEPENDENT_AMBULATORY_CARE_PROVIDER_SITE_OTHER): Payer: Medicare Other | Admitting: Family Medicine

## 2018-05-17 ENCOUNTER — Other Ambulatory Visit: Payer: Self-pay

## 2018-05-17 VITALS — BP 102/61 | HR 69 | Temp 98.1°F | Wt 240.0 lb

## 2018-05-17 DIAGNOSIS — S161XXD Strain of muscle, fascia and tendon at neck level, subsequent encounter: Secondary | ICD-10-CM
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
## 2018-05-17 NOTE — Patient Instructions (Signed)
Ibuprofen 600mg  every 6 hours as needed  Biofreeze  Heat to the area  Stop the robaxin  Cervical Strain and Sprain Rehab Ask your health care provider which exercises are safe for you. Do exercises exactly as told by your health care provider and adjust them as directed. It is normal to feel mild stretching, pulling, tightness, or discomfort as you do these exercises, but you should stop right away if you feel sudden pain or your pain gets worse.Do not begin these exercises until told by your health care provider. Stretching and range of motion exercises These exercises warm up your muscles and joints and improve the movement and flexibility of your neck. These exercises also help to relieve pain, numbness, and tingling. Exercise A: Cervical side bend  1. Using good posture, sit on a stable chair or stand up. 2. Without moving your shoulders, slowly tilt your left / right ear to your shoulder until you feel a stretch in your neck muscles. You should be looking straight ahead. 3. Hold for __________ seconds. 4. Repeat with the other side of your neck. Repeat __________ times. Complete this exercise __________ times a day. Exercise B: Cervical rotation  1. Using good posture, sit on a stable chair or stand up. 2. Slowly turn your head to the side as if you are looking over your left / right shoulder. ? Keep your eyes level with the ground. ? Stop when you feel a stretch along the side and the back of your neck. 3. Hold for __________ seconds. 4. Repeat this by turning to your other side. Repeat __________ times. Complete this exercise __________ times a day. Exercise C: Thoracic extension and pectoral stretch 1. Roll a towel or a small blanket so it is about 4 inches (10 cm) in diameter. 2. Lie down on your back on a firm surface. 3. Put the towel lengthwise, under your spine in the middle of your back. It should not be not under your shoulder blades. The towel should line up with your  spine from your middle back to your lower back. 4. Put your hands behind your head and let your elbows fall out to your sides. 5. Hold for __________ seconds. Repeat __________ times. Complete this exercise __________ times a day. Strengthening exercises These exercises build strength and endurance in your neck. Endurance is the ability to use your muscles for a long time, even after your muscles get tired. Exercise D: Upper cervical flexion, isometric 1. Lie on your back with a thin pillow behind your head and a small rolled-up towel under your neck. 2. Gently tuck your chin toward your chest and nod your head down to look toward your feet. Do not lift your head off the pillow. 3. Hold for __________ seconds. 4. Release the tension slowly. Relax your neck muscles completely before you repeat this exercise. Repeat __________ times. Complete this exercise __________ times a day. Exercise E: Cervical extension, isometric  1. Stand about 6 inches (15 cm) away from a wall, with your back facing the wall. 2. Place a soft object, about 6-8 inches (15-20 cm) in diameter, between the back of your head and the wall. A soft object could be a small pillow, a ball, or a folded towel. 3. Gently tilt your head back and press into the soft object. Keep your jaw and forehead relaxed. 4. Hold for __________ seconds. 5. Release the tension slowly. Relax your neck muscles completely before you repeat this exercise. Repeat __________ times. Complete this  exercise __________ times a day. Posture and body mechanics  Body mechanics refers to the movements and positions of your body while you do your daily activities. Posture is part of body mechanics. Good posture and healthy body mechanics can help to relieve stress in your body's tissues and joints. Good posture means that your spine is in its natural S-curve position (your spine is neutral), your shoulders are pulled back slightly, and your head is not tipped  forward. The following are general guidelines for applying improved posture and body mechanics to your everyday activities. Standing  When standing, keep your spine neutral and keep your feet about hip-width apart. Keep a slight bend in your knees. Your ears, shoulders, and hips should line up.  When you do a task in which you stand in one place for a long time, place one foot up on a stable object that is 2-4 inches (5-10 cm) high, such as a footstool. This helps keep your spine neutral. Sitting   When sitting, keep your spine neutral and your keep feet flat on the floor. Use a footrest, if necessary, and keep your thighs parallel to the floor. Avoid rounding your shoulders, and avoid tilting your head forward.  When working at a desk or a computer, keep your desk at a height where your hands are slightly lower than your elbows. Slide your chair under your desk so you are close enough to maintain good posture.  When working at a computer, place your monitor at a height where you are looking straight ahead and you do not have to tilt your head forward or downward to look at the screen. Resting When lying down and resting, avoid positions that are most painful for you. Try to support your neck in a neutral position. You can use a contour pillow or a small rolled-up towel. Your pillow should support your neck but not push on it. This information is not intended to replace advice given to you by your health care provider. Make sure you discuss any questions you have with your health care provider. Document Released: 06/28/2005 Document Revised: 03/04/2016 Document Reviewed: 06/04/2015 Elsevier Interactive Patient Education  [employer].

## 2018-05-31 ENCOUNTER — Other Ambulatory Visit: Payer: Self-pay

## 2018-05-31 ENCOUNTER — Ambulatory Visit (INDEPENDENT_AMBULATORY_CARE_PROVIDER_SITE_OTHER): Payer: Medicare Other | Admitting: Family Medicine

## 2018-05-31 VITALS — BP 105/62 | HR 80 | Temp 98.0°F

## 2018-05-31 DIAGNOSIS — R42 Dizziness and giddiness: Secondary | ICD-10-CM | POA: Diagnosis present
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
## 2018-05-31 NOTE — Patient Instructions (Signed)
Referred to Ear Nose and Throat. Someone should call you, if you do not hear back after 1-2 weeks please call back to check on the status of the referral.  Vertigo Vertigo means that you feel like you are moving when you are not. Vertigo can also make you feel like things around you are moving when they are not. This feeling can come and go at any time. Vertigo often goes away on its own. Follow these instructions at home:  Avoid making fast movements.  Avoid driving.  Avoid using heavy machinery.  Avoid doing any task or activity that might cause danger to you or other people if you would have a vertigo attack while you are doing it.  Sit down right away if you feel dizzy or have trouble with your balance.  Take over-the-counter and prescription medicines only as told by your doctor.  Follow instructions from your doctor about which positions or movements you should avoid.  Drink enough fluid to keep your pee (urine) clear or pale yellow.  Keep all follow-up visits as told by your doctor. This is important. Contact a doctor if:  Medicine does not help your vertigo.  You have a fever.  Your problems get worse or you have new symptoms.  Your family or friends see changes in your behavior.  You feel sick to your stomach (nauseous) or you throw up (vomit).  You have a "pins and needles" feeling or you are numb in part of your body. Get help right away if:  You have trouble moving or talking.  You are always dizzy.  You pass out (faint).  You get very bad headaches.  You feel weak or have trouble using your hands, arms, or legs.  You have changes in your hearing.  You have changes in your seeing (vision).  You get a stiff neck.  Bright light starts to bother you. This information is not intended to replace advice given to you by your health care provider. Make sure you discuss any questions you have with your health care provider. Document Released: 04/06/2008  Document Revised: 12/04/2015 Document Reviewed: 10/21/2014 Elsevier Interactive Patient Education  [employer].

## 2018-05-31 NOTE — Assessment & Plan Note (Signed)
Patient with long standing vertigo and reporting some difficulty hearing and tinnitus. DDx includes menieres. Unlikely BPPV given neg dix hallpike. Refer to ENT as may need audiology evaluation.

## 2018-07-14 ENCOUNTER — Encounter: Payer: Self-pay | Admitting: Family Medicine

## 2018-07-14 ENCOUNTER — Ambulatory Visit (INDEPENDENT_AMBULATORY_CARE_PROVIDER_SITE_OTHER): Payer: Medicare Other | Admitting: Family Medicine

## 2018-07-14 VITALS — BP 120/78 | HR 78 | Temp 98.8°F | Resp 14

## 2018-07-14 DIAGNOSIS — J4541 Moderate persistent asthma with (acute) exacerbation: Secondary | ICD-10-CM | POA: Diagnosis present

## 2018-07-14 MED ORDER — FLUTICASONE PROPIONATE 50 MCG/ACT NA SUSP: 2.0000 | Freq: Every day | NASAL | 6 refills | Status: DC

## 2018-07-14 MED ORDER — PREDNISONE 20 MG PO TABS: 40.0000 mg | ORAL_TABLET | Freq: Every day | ORAL | 0 refills | Status: AC

## 2018-07-14 NOTE — Patient Instructions (Addendum)
It was wonderful to see you today.  Thank you for choosing [facility].   Please call [phone] with any questions about today's appointment.  Please be sure to schedule follow up at the front  desk before you leave today.   Dorris Singh, MD  Family Medicine   Restart your Flovent  Go to the ED if you develop fevers, difficulty breathing  Use your albuterol every 6 hours

## 2018-07-14 NOTE — Progress Notes (Signed)
  Patient Name: Adeliz Tonkinson Date of Birth: 1980-07-28 Date of Visit: 07/14/18 PCP: [name], DO  Chief Complaint: cough and congestion   Subjective: Tysha Grismore is a pleasant 38 y.o. year old with history of mild persistent asthma, bipolar disorder, and obesity presenting with cough and wheezing. She reports 3 days of cough, congestion, and hoarseness. Her biggest concern is postnasal drip and congestion. She has re-started her antihistamine, using albuterol 2-3 times per day. She denies chest pain, palpitations, dyspnea. No fevers. No productive cough. She has a sick contact- friend's kid with bronchiolitis. No smoking or e-cigarette use. She is not using any of her controller inhaler.   Patient reports an LMP of 1 week ago, condoms for contraception.     ROS:  ROS As above.  I have reviewed the patient's medical, surgical, family, and social history as appropriate.   Vitals:   07/14/18 1506  BP: 120/78  Pulse: 78  Resp: 14  Temp: 98.8 F (37.1 C)  SpO2: 100%   HEENT: Sclera anicteric. Dentition is moderate. Appears well hydrated. + evidence PND, she is hoarse  Neck: Supple Cardiac: Regular rate and rhythm. Normal S1/S2. No murmurs, rubs, or gallops appreciated. Lungs: Trace expiratory wheezing, no tachypnea, no increased WOB, good air entry.  Abdomen: Normoactive bowel sounds. No tenderness to deep or light palpation. No rebound or guarding.  Extremities: Warm, well perfused without edema.  Skin: Warm, dry Psych: Pleasant and appropriate      Brittaney was seen today for cough.  Diagnoses and all orders for this visit:  Moderate persistent asthma with acute exacerbation No signs of pneumonia. Likely precipitated by viral illness, possibly weather change. HR, RR, and pulse oximetry normal.  -     predniSONE (DELTASONE) 20 MG tablet; Take 2 tablets (40 mg total) by mouth daily with breakfast for 5 days. -     fluticasone (FLONASE) 50 MCG/ACT nasal spray;  Place 2 sprays into both nostrils daily. -  May benefit from Symbicort PRN in future.  - Discussed strict return precautions.   Dizziness- patient has not received call from ENT.  Messaged Kennyth Lose regarding ENT referral.   Dorris Singh, MD  [facility] Medicine Teaching Service

## 2018-07-18 ENCOUNTER — Encounter: Payer: Self-pay | Admitting: Family Medicine

## 2018-07-18 ENCOUNTER — Other Ambulatory Visit: Payer: Self-pay

## 2018-07-18 ENCOUNTER — Ambulatory Visit (INDEPENDENT_AMBULATORY_CARE_PROVIDER_SITE_OTHER): Payer: Medicare Other | Admitting: Family Medicine

## 2018-07-18 VITALS — BP 102/62 | HR 74 | Temp 98.9°F | Ht 67.0 in

## 2018-07-18 DIAGNOSIS — J069 Acute upper respiratory infection, unspecified: Secondary | ICD-10-CM

## 2018-07-18 DIAGNOSIS — J4541 Moderate persistent asthma with (acute) exacerbation: Secondary | ICD-10-CM

## 2018-07-18 DIAGNOSIS — R0982 Postnasal drip: Secondary | ICD-10-CM | POA: Diagnosis not present

## 2018-07-18 MED ORDER — AZITHROMYCIN 250 MG PO TABS: ORAL_TABLET | ORAL | 0 refills | Status: DC

## 2018-07-18 MED ORDER — BENZONATATE 100 MG PO CAPS: 100.0000 mg | ORAL_CAPSULE | Freq: Two times a day (BID) | ORAL | 0 refills | Status: DC | PRN

## 2018-07-18 NOTE — Progress Notes (Signed)
Subjective:    Colleen Peck is a 38 y.o. female who presents to [facility] today for URI symptoms:  1.  URI symptoms:  Present now for over a week.  Describes ongoing cough, congestion, postnasal drip, and some hoarseness.  Presented to care about 4 days ago and diagnosed with asthma exacerbation at that time.  Told to start Flonase nasal spray, prednisone, restart flovent, use albuterol every 6 hours.    Since then, still with ongoing symptoms.  Cough is worsening, especially at night, and with thick green sputum.  Still with hoarseness.  She has been unable to stop any postnasal drip despite use of Zyrtec plus Flonase.  Feels this is irritating her throat.  Cough is now to the point where she is having to wear a pad because of prolonged coughing spells and some mild stress incontinence after coughing.    No fevers/chills.    Friend's kid with bronciolotisis.  Non-smoker.    ROS as above per HPI.    The following portions of the patient's history were reviewed and updated as appropriate: allergies, current medications, past medical history, family and social history, and problem list. Patient is a nonsmoker.    PMH reviewed.  Past Medical History:  Diagnosis Date  . Abnormal pap    pt reports abnl pap many years ago.  Nl since then.  . Allergy    seasonal  . Anemia   . Asthma   . Bipolar disorder ([place])   . Depression   . Palpitations 03/12/2008  . PTSD (post-traumatic stress disorder)   . Seasonal allergies   . Thyroid disease 2009   Graves disease (pt reported resolved); hypothyriodism   Past Surgical History:  Procedure Laterality Date  . DILATION AND CURETTAGE OF UTERUS  March 2006    Medications reviewed.   Objective:   Physical Exam BP 102/62   Pulse 74   Temp 98.9 F (37.2 C) (Oral)   Ht 5\' 7"  (1.702 m)   LMP 07/01/2018 (Exact Date)   SpO2 98%   BMI 37.59 kg/m  Gen:  Patient sitting on exam table, appears stated age in no acute distress Head: Normocephalic  atraumatic Eyes: EOMI, PERRL, sclera and conjunctiva non-erythematous Ears:  Canals clear bilaterally.  TMs pearly gray bilaterally without erythema or bulging.   Nose:  Nasal turbinates grossly enlarged bilaterally. Some exudates noted. Tender to palpation of maxillary sinus  Mouth: Mucosa membranes moist. Tonsils +2, nonenlarged, non-erythematous. Neck: No cervical lymphadenopathy noted Heart:  RRR, no murmurs auscultated. Pulm:  Minimal wheezing at bases, otherwise lungs clear.     Imp/Plan: 1. URI with asthma exacerbation: - no real improvement.  - Plan to treat due to length of symptoms and no improvement.   Will prescribe azithromycin x 5 days. Instructed patient to return in 1 week for checkup or sooner if worsening or no improvement.  - Sudafed plus Tessalon perles for cough and postnasal drip. - still has a day left of prednisone.  Continue this plus albuterol PRN.

## 2018-07-18 NOTE — Patient Instructions (Signed)
It was good to see you again today.  Pick up some Zyrtec-D or Sudafed to clear out your sinuses and dry the postnasal drip.  Take the azithromycin as prescribed:  Take 2 pills today and then 1 pill daily after that.    Take the Tessalon perles twice a day for cough.  This should get you cleared up.  Come back in a week if you're not better, sooner if worsening.

## 2018-08-02 ENCOUNTER — Encounter: Payer: Self-pay | Admitting: Family Medicine

## 2018-08-02 ENCOUNTER — Other Ambulatory Visit: Payer: Self-pay

## 2018-08-02 ENCOUNTER — Ambulatory Visit (INDEPENDENT_AMBULATORY_CARE_PROVIDER_SITE_OTHER): Payer: Medicare Other | Admitting: Family Medicine

## 2018-08-02 VITALS — BP 112/68 | HR 89 | Temp 98.0°F | Wt 238.0 lb

## 2018-08-02 DIAGNOSIS — R42 Dizziness and giddiness: Secondary | ICD-10-CM | POA: Diagnosis not present

## 2018-08-02 DIAGNOSIS — E559 Vitamin D deficiency, unspecified: Secondary | ICD-10-CM

## 2018-08-02 DIAGNOSIS — J302 Other seasonal allergic rhinitis: Secondary | ICD-10-CM | POA: Diagnosis not present

## 2018-08-02 NOTE — Assessment & Plan Note (Signed)
Symptoms have resolved. - cont zyrtec

## 2018-08-02 NOTE — Assessment & Plan Note (Signed)
Has been evaluated previously and referred to ENT, but has not heard about appointment.  Colleen Peck to resend referral - patient to call in 1 week if she has not heard from the office

## 2018-08-02 NOTE — Patient Instructions (Signed)
Thank you for coming to see me today. It was a pleasure. Today we talked about:   Your allergies:  I am glad that they are doing better.  Your dizziness:  We have resent your referral to ENT.  If you do not hear from them within a week, please call our office.  Vitamin D: please make sure that you are taking 800mg  one a day.  We will recheck your levels at your next visit.  Please follow-up in 4 months or sooner as needed.  If you have any questions or concerns, please do not hesitate to call the office at [phone].  Best,   Arizona Constable, DO
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
## 2018-08-02 NOTE — Assessment & Plan Note (Signed)
Patient not taking medication as prescribed.  Last Vit D 9/19 was 25.  Prescribed Vit D 800mg  QD.  Patient wants to wait to check level. - encourage adherence to therapy - return in 4 months for Vit D recheck

## 2018-08-18 ENCOUNTER — Ambulatory Visit (INDEPENDENT_AMBULATORY_CARE_PROVIDER_SITE_OTHER): Payer: Medicare Other | Admitting: Student in an Organized Health Care Education/Training Program

## 2018-08-18 ENCOUNTER — Other Ambulatory Visit: Payer: Self-pay

## 2018-08-18 VITALS — BP 112/68 | HR 68 | Temp 98.4°F | Ht 67.0 in

## 2018-08-18 DIAGNOSIS — E039 Hypothyroidism, unspecified: Secondary | ICD-10-CM | POA: Diagnosis not present

## 2018-08-18 DIAGNOSIS — G9001 Carotid sinus syncope: Secondary | ICD-10-CM

## 2018-08-18 DIAGNOSIS — N943 Premenstrual tension syndrome: Secondary | ICD-10-CM | POA: Diagnosis not present

## 2018-08-18 MED ORDER — LEVOTHYROXINE SODIUM 112 MCG PO TABS: ORAL_TABLET | ORAL | 2 refills | Status: DC

## 2018-08-18 NOTE — Patient Instructions (Signed)
It was a pleasure seeing you today in our clinic. Here is the treatment plan we have discussed and agreed upon together:  We drew blood work at today's visit. I will call or send you a letter with these results. If you do not hear from me within the next week, please give our office a call.  Our clinic's number is [phone]. Please call with questions or concerns about what we discussed today.  Be well, Dr. Sammie Denner   

## 2018-08-18 NOTE — Progress Notes (Signed)
Subjective:    Colleen Peck - 38 y.o. female MRN [id number]  Date of birth: Oct 16, 1980  HPI  Colleen Peck is here for mood symptoms during her menes and she would like brand name synthroid prescribed.  1. Mood symptoms - patient reports that during her menstrual cycle she feels she is excessively sad and tearful. She denies any SI or HI. Her periods are regular every month and her symptoms resolve between cycles, however she feels that she is not able to function normally due to mood symptoms during her cycles. This has been going on for several months. She also feels her period bleeding has gotten heavier over the past few months. She is bipolar and follows with psychiatry for lamictal.   2. Medication inquiry - patient would like prescription for brand synthroid rather than generic levothyroxine. She is a member of support groups for hypothyroid patients on the internet and she expresses concern that the generic drug makes it more difficult to lose weight. She understands that the brand medication may not be covered by insurance and she is fine with paying out of pocket. She has a good RX coupon with her.  Health Maintenance:  Health Maintenance Due  Topic Date Due  . PAP SMEAR-Modifier  09/04/2017    -  reports that she has never smoked. She has never used smokeless tobacco. - Review of Systems: Per HPI. - Past Medical History: Patient Active Problem List   Diagnosis Date Noted  . PMS (premenstrual syndrome) 08/20/2018  . Vertigo 05/31/2018  . Numbness and tingling of both feet 03/21/2018  . Hair loss 03/21/2018  . Urticaria 12/15/2017  . Change in bowel habits 09/01/2017  . Rectal pain 09/01/2017  . Lower abdominal pain 09/01/2017  . Cold sore 08/08/2017  . Dermatofibroma of right lower leg 01/06/2017  . History of borderline personality disorder 01/01/2016  . Carotidynia 12/16/2015  . Schizoaffective disorder, bipolar type ([place]) 11/26/2015  . Anxiety   . Pain of left  calf 11/10/2015  . PTSD (post-traumatic stress disorder) 09/30/2015  . Paranoia ([place])   . Anemia 01/30/2015  . Vitamin D deficiency 05/22/2013  . Hypothyroidism (acquired) 11/29/2011  . Obesity 09/08/2006  . Allergic rhinitis 09/08/2006  . Asthma 09/08/2006  . ECZEMA, ATOPIC DERMATITIS 09/08/2006   - Medications: reviewed and updated No current facility-administered medications for this visit.    Current Outpatient Medications  Medication Sig Dispense Refill  . albuterol (PROVENTIL HFA;VENTOLIN HFA) 108 (90 Base) MCG/ACT inhaler Inhale 2 puffs into the lungs every 6 (six) hours as needed for wheezing or shortness of breath. 18 g 1  . albuterol (PROVENTIL) (2.5 MG/3ML) 0.083% nebulizer solution Take 3 mLs (2.5 mg total) by nebulization every 6 (six) hours as needed for wheezing or shortness of breath. 150 mL 1  . azithromycin (ZITHROMAX) 250 MG tablet Take 2 pills today and then 1 pill daily after that. 6 tablet 0  . benzonatate (TESSALON) 100 MG capsule Take 1 capsule (100 mg total) by mouth 2 (two) times daily as needed for cough. 20 capsule 0  . Cholecalciferol 800 UNIT (20 MCG) TABS Take 800 Units by mouth daily. 30 tablet 3  . fluticasone (FLONASE) 50 MCG/ACT nasal spray Place 2 sprays into both nostrils daily. 16 g 6  . fluticasone (FLOVENT HFA) 110 MCG/ACT inhaler Inhale 1 puff into the lungs 2 (two) times daily. 1 Inhaler 12  . hydrOXYzine (ATARAX/VISTARIL) 25 MG tablet Take 1 tablet (25 mg total) by mouth every 6 (six)  hours as needed for anxiety or itching. 60 tablet 2  . lamoTRIgine (LAMICTAL) 200 MG tablet Take 1 tablet (200 mg total) by mouth daily. Take 1 tablet (25 mg) in the morning & 2 tablets (50 mg) in the evening: For mood stabilization 30 tablet 11  . levothyroxine (SYNTHROID) 112 MCG tablet Take 2 tablets (224 mcg) by mouth each morning before breakfast. 60 tablet 2  . methocarbamol (ROBAXIN) 500 MG tablet Take 1 tablet (500 mg total) by mouth 2 (two) times daily. 20  tablet 0   Facility-Administered Medications Ordered in Other Visits  Medication Dose Route Frequency Provider Last Rate Last Dose  . iopamidol (ISOVUE-370) 76 % injection           . sodium chloride 0.9 % bolus 1,000 mL  1,000 mL Intravenous Once Blanchie Dessert, MD 983.6 mL/hr at 08/20/18 1320 1,000 mL at 08/20/18 1320    Review of Systems See HPI     Objective:   Physical Exam BP 112/68   Pulse 68   Temp 98.4 F (36.9 C) (Oral)   Ht 5\' 7"  (1.702 m)   LMP 08/16/2018 (Approximate)   SpO2 99%   BMI 37.28 kg/m  GEN: NAD, alert, cooperative, and pleasant. RESPIRATORY: Comfortable work of breathing, speaks in full sentences CV: Regular rate noted, distal extremities well perfused and warm without edema GI: Soft, nondistended SKIN: warm and dry, no rashes or lesions NEURO: II-XII grossly intact MSK: Moves 4 extremities equally PSYCH: AAOx3, appropriate affect     Assessment & Plan:   Carotidynia Will check TSH. Synthroid script printed and handed to patient.  PMS (premenstrual syndrome) PMS vs PMDD. Considered SSRI, however this is less preferred with comorbidity of bipolar disorder. Patient is already on lamictal. She has an appointment with psychiatry.  - Could also try an OCP but patient declines this as she is interested in becoming pregnant at some point in the future.  - Patient information on PMS and PMDD were printed.  - Mood log was printed for patient to record her symptoms and bring to her psychiatrist - will check TSH given change in menstrual pattern over the  past several months. Last TSH 5 months ago was WNL.    Orders Placed This Encounter  Procedures  . TSH    Meds ordered this encounter  Medications  . levothyroxine (SYNTHROID) 112 MCG tablet    Sig: Take 2 tablets (224 mcg) by mouth each morning before breakfast.    Dispense:  60 tablet    Refill:  2    Patient requests Name-brand Synthroid.   Everrett Coombe, MD,MS,  PGY3 08/20/2018 1:23  PM

## 2018-08-19 LAB — TSH: TSH: 1.14 u[IU]/mL (ref 0.450–4.500)

## 2018-08-20 ENCOUNTER — Other Ambulatory Visit: Payer: Self-pay

## 2018-08-20 ENCOUNTER — Emergency Department (HOSPITAL_COMMUNITY)
Admission: EM | Admit: 2018-08-20 | Discharge: 2018-08-20 | Disposition: A | Discharge status: Home / Self Care | Payer: Medicare Other | Attending: Emergency Medicine | Admitting: Emergency Medicine

## 2018-08-20 ENCOUNTER — Emergency Department (HOSPITAL_COMMUNITY): Payer: Medicare Other

## 2018-08-20 ENCOUNTER — Encounter (HOSPITAL_COMMUNITY): Payer: Self-pay | Admitting: Emergency Medicine

## 2018-08-20 ENCOUNTER — Encounter: Payer: Self-pay | Admitting: Student in an Organized Health Care Education/Training Program

## 2018-08-20 DIAGNOSIS — J45909 Unspecified asthma, uncomplicated: Secondary | ICD-10-CM | POA: Insufficient documentation

## 2018-08-20 DIAGNOSIS — R42 Dizziness and giddiness: Secondary | ICD-10-CM | POA: Diagnosis present

## 2018-08-20 DIAGNOSIS — T50901A Poisoning by unspecified drugs, medicaments and biological substances, accidental (unintentional), initial encounter: Secondary | ICD-10-CM

## 2018-08-20 DIAGNOSIS — T50991A Poisoning by other drugs, medicaments and biological substances, accidental (unintentional), initial encounter: Secondary | ICD-10-CM | POA: Diagnosis not present

## 2018-08-20 DIAGNOSIS — H81399 Other peripheral vertigo, unspecified ear: Secondary | ICD-10-CM

## 2018-08-20 DIAGNOSIS — Z79899 Other long term (current) drug therapy: Secondary | ICD-10-CM | POA: Insufficient documentation

## 2018-08-20 DIAGNOSIS — N943 Premenstrual tension syndrome: Secondary | ICD-10-CM | POA: Insufficient documentation

## 2018-08-20 DIAGNOSIS — E039 Hypothyroidism, unspecified: Secondary | ICD-10-CM | POA: Insufficient documentation

## 2018-08-20 LAB — CBC WITH DIFFERENTIAL/PLATELET
ABS IMMATURE GRANULOCYTES: 0.02 10*3/uL (ref 0.00–0.07)
BASOS ABS: 0 10*3/uL (ref 0.0–0.1)
Basophils Relative: 1 %
EOS ABS: 0 10*3/uL (ref 0.0–0.5)
Eosinophils Relative: 1 %
HEMATOCRIT: 36.6 % (ref 36.0–46.0)
HEMOGLOBIN: 10.9 g/dL — AB (ref 12.0–15.0)
IMMATURE GRANULOCYTES: 0 %
LYMPHS ABS: 1.2 10*3/uL (ref 0.7–4.0)
LYMPHS PCT: 21 %
MCH: 25.9 pg — ABNORMAL LOW (ref 26.0–34.0)
MCHC: 29.8 g/dL — ABNORMAL LOW (ref 30.0–36.0)
MCV: 86.9 fL (ref 80.0–100.0)
MONOS PCT: 7 %
Monocytes Absolute: 0.4 10*3/uL (ref 0.1–1.0)
NEUTROS ABS: 3.8 10*3/uL (ref 1.7–7.7)
NEUTROS PCT: 70 %
NRBC: 0 % (ref 0.0–0.2)
Platelets: 332 10*3/uL (ref 150–400)
RBC: 4.21 MIL/uL (ref 3.87–5.11)
RDW: 14.1 % (ref 11.5–15.5)
WBC: 5.4 10*3/uL (ref 4.0–10.5)

## 2018-08-20 LAB — COMPREHENSIVE METABOLIC PANEL
ALBUMIN: 3.7 g/dL (ref 3.5–5.0)
ALK PHOS: 41 U/L (ref 38–126)
ALT: 27 U/L (ref 0–44)
ANION GAP: 8 (ref 5–15)
AST: 27 U/L (ref 15–41)
BILIRUBIN TOTAL: 0.7 mg/dL (ref 0.3–1.2)
BUN: 6 mg/dL (ref 6–20)
CALCIUM: 9.5 mg/dL (ref 8.9–10.3)
CO2: 26 mmol/L (ref 22–32)
Chloride: 106 mmol/L (ref 98–111)
Creatinine, Ser: 1.04 mg/dL — ABNORMAL HIGH (ref 0.44–1.00)
Glucose, Bld: 91 mg/dL (ref 70–99)
POTASSIUM: 3.9 mmol/L (ref 3.5–5.1)
Sodium: 140 mmol/L (ref 135–145)
TOTAL PROTEIN: 6.4 g/dL — AB (ref 6.5–8.1)

## 2018-08-20 LAB — I-STAT BETA HCG BLOOD, ED (MC, WL, AP ONLY)

## 2018-08-20 MED ORDER — MECLIZINE HCL 25 MG PO TABS
25.0000 mg | ORAL_TABLET | Freq: Once | ORAL | Status: AC
  Administered 2018-08-20: 25 mg via ORAL
  Filled 2018-08-20: qty 1

## 2018-08-20 MED ORDER — METOCLOPRAMIDE HCL 5 MG/ML IJ SOLN
10.0000 mg | Freq: Once | INTRAMUSCULAR | Status: AC
  Administered 2018-08-20: 10 mg via INTRAVENOUS
  Filled 2018-08-20: qty 2

## 2018-08-20 MED ORDER — SODIUM CHLORIDE 0.9 % IV BOLUS
1000.0000 mL | Freq: Once | INTRAVENOUS | Status: AC
  Administered 2018-08-20: 1000 mL via INTRAVENOUS

## 2018-08-20 MED ORDER — IOPAMIDOL (ISOVUE-370) INJECTION 76%
INTRAVENOUS | Status: AC
  Filled 2018-08-20: qty 100

## 2018-08-20 MED ORDER — IOPAMIDOL (ISOVUE-370) INJECTION 76%
75.0000 mL | Freq: Once | INTRAVENOUS | Status: AC | PRN
  Administered 2018-08-20: 100 mL via INTRAVENOUS

## 2018-08-20 NOTE — ED Notes (Signed)
Pt requests work note prior to discharge.

## 2018-08-20 NOTE — ED Triage Notes (Signed)
EMS stated, took Thyroid med, Lamictal, last night and she switch and took it again this morning around 430 . She was trying to switch her schedule of meds, so not on purpose. She has a headache, with nausea, tongue numb, and dizziness

## 2018-08-20 NOTE — ED Notes (Signed)
Pt sitting up in bed with towels off head, tolerating crackers and cheese and sprite.  Lights still off but seeming to feel a bit better.

## 2018-08-20 NOTE — ED Provider Notes (Signed)
[facility] EMERGENCY DEPARTMENT Provider Note   CSN: [id number] Arrival date & time:        History   Chief Complaint No chief complaint on file.   HPI Colleen Peck is a 38 y.o. female.  Patient is a 38 year old female with a history of bipolar disorder, depression, hypothyroidism who is presenting today with an accidental overdose.  Patient typically takes her Synthroid and Lamictal in the evenings but decided she was going to start taking them in the mornings.  She took a dose last night but then this morning also took a dose around 430.  She then developed  nausea, her full tongue feeling numb and feeling generally dizzy (a spinning sensation) around 5:45 while she was at work.  Any type of movement or walking makes the dizziness worse to the point she states she was not able to walk because she felt like she was going to run into the wall.  She is also noted neck pain since this started as well.  She has had some episodes of dizziness in the past and was told it may be vertigo but nothing to this extent.  He is also very sensitive to the light but denies a history of headaches. she has had no vomiting or palpitations at this time.  She denies any abdominal pain or chest pain.  She denies the desire to want hurt herself or that this was intentional.  The history is provided by the patient.    Past Medical History:  Diagnosis Date  . Abnormal pap    pt reports abnl pap many years ago.  Nl since then.  . Allergy    seasonal  . Anemia   . Asthma   . Bipolar disorder ([place])   . Depression   . Palpitations 03/12/2008  . PTSD (post-traumatic stress disorder)   . Seasonal allergies   . Thyroid disease 2009   Graves disease (pt reported resolved); hypothyriodism    Patient Active Problem List   Diagnosis Date Noted  . Vertigo 05/31/2018  . Numbness and tingling of both feet 03/21/2018  . Hair loss 03/21/2018  . Urticaria 12/15/2017  . Change in bowel habits  09/01/2017  . Rectal pain 09/01/2017  . Lower abdominal pain 09/01/2017  . Cold sore 08/08/2017  . Dermatofibroma of right lower leg 01/06/2017  . History of borderline personality disorder 01/01/2016  . Carotidynia 12/16/2015  . Schizoaffective disorder, bipolar type ([place]) 11/26/2015  . Anxiety   . Pain of left calf 11/10/2015  . PTSD (post-traumatic stress disorder) 09/30/2015  . Paranoia ([place])   . Anemia 01/30/2015  . Vitamin D deficiency 05/22/2013  . Hypothyroidism (acquired) 11/29/2011  . Obesity 09/08/2006  . Allergic rhinitis 09/08/2006  . Asthma 09/08/2006  . ECZEMA, ATOPIC DERMATITIS 09/08/2006    Past Surgical History:  Procedure Laterality Date  . DILATION AND CURETTAGE OF UTERUS  March 2006     OB History   No obstetric history on file.      Home Medications    Prior to Admission medications   Medication Sig Start Date End Date Taking? Authorizing Provider  albuterol (PROVENTIL HFA;VENTOLIN HFA) 108 (90 Base) MCG/ACT inhaler Inhale 2 puffs into the lungs every 6 (six) hours as needed for wheezing or shortness of breath. 03/21/18   Meccariello, Bernita Raisin, DO  albuterol (PROVENTIL) (2.5 MG/3ML) 0.083% nebulizer solution Take 3 mLs (2.5 mg total) by nebulization every 6 (six) hours as needed for wheezing or shortness of breath.  11/24/17   Nicolette Bang, DO  azithromycin (ZITHROMAX) 250 MG tablet Take 2 pills today and then 1 pill daily after that. 07/18/18   Alveda Reasons, MD  benzonatate (TESSALON) 100 MG capsule Take 1 capsule (100 mg total) by mouth 2 (two) times daily as needed for cough. 07/18/18   Alveda Reasons, MD  Cholecalciferol 800 UNIT (20 MCG) TABS Take 800 Units by mouth daily. 03/22/18   Meccariello, Bernita Raisin, DO  fluticasone (FLONASE) 50 MCG/ACT nasal spray Place 2 sprays into both nostrils daily. 07/14/18   Martyn Malay, MD  fluticasone (FLOVENT HFA) 110 MCG/ACT inhaler Inhale 1 puff into the lungs 2 (two) times daily. 12/16/17   Nicolette Bang, DO  hydrOXYzine (ATARAX/VISTARIL) 25 MG tablet Take 1 tablet (25 mg total) by mouth every 6 (six) hours as needed for anxiety or itching. 12/15/17   Donnamae Jude, MD  lamoTRIgine (LAMICTAL) 200 MG tablet Take 1 tablet (200 mg total) by mouth daily. Take 1 tablet (25 mg) in the morning & 2 tablets (50 mg) in the evening: For mood stabilization 11/24/17   Nicolette Bang, DO  levothyroxine (SYNTHROID) 112 MCG tablet Take 2 tablets (224 mcg) by mouth each morning before breakfast. 08/18/18   Everrett Coombe, MD  methocarbamol (ROBAXIN) 500 MG tablet Take 1 tablet (500 mg total) by mouth 2 (two) times daily. 05/13/18   Volanda Napoleon, PA-C    Family History Family History  Problem Relation Age of Onset  . Drug abuse Father   . Depression Maternal Grandmother   . Anxiety disorder Maternal Grandmother   . COPD Maternal Grandmother   . Suicidality Cousin   . Depression Cousin   . Bipolar disorder Cousin   . Hypertension Mother   . Depression Mother   . Diabetes Paternal Grandfather   . COPD Paternal Grandmother   . Depression Maternal Aunt   . Breast cancer Maternal Aunt   . Depression Maternal Aunt   . Heart disease Neg Hx     Social History Social History   Tobacco Use  . Smoking status: Never Smoker  . Smokeless tobacco: Never Used  Substance Use Topics  . Alcohol use: No  . Drug use: Yes    Types: Marijuana    Comment: past use of marijuana in '08-'09. occasional eats brownies w/ marijuana  before thanksgiving     Allergies   Kiwi extract and Pineapple   Review of Systems Review of Systems  All other systems reviewed and are negative.    Physical Exam Updated Vital Signs BP (!) 130/57 (BP Location: Right Arm)   Pulse (!) 58   Temp 98.2 F (36.8 C) (Oral)   Resp 18   Ht 5\' 7"  (1.702 m)   Wt 108.9 kg   LMP 08/16/2018 (Approximate)   SpO2 100%   BMI 37.59 kg/m   Physical Exam Vitals signs and nursing note reviewed.  Constitutional:       General: She is not in acute distress.    Appearance: She is well-developed.  HENT:     Head: Normocephalic and atraumatic.     Right Ear: Tympanic membrane normal.     Left Ear: Tympanic membrane normal.  Eyes:     General: No visual field deficit.    Extraocular Movements: Extraocular movements intact.     Pupils: Pupils are equal, round, and reactive to light.     Comments: Photophobia.  No notable nystagmus however exam is difficult because  patient is squinting and having a hard time keeping her eyes open  Cardiovascular:     Rate and Rhythm: Normal rate and regular rhythm.     Heart sounds: Normal heart sounds. No murmur. No friction rub.  Pulmonary:     Effort: Pulmonary effort is normal.     Breath sounds: Normal breath sounds. No wheezing or rales.  Abdominal:     General: Bowel sounds are normal. There is no distension.     Palpations: Abdomen is soft.     Tenderness: There is no abdominal tenderness. There is no guarding or rebound.  Musculoskeletal: Normal range of motion.        General: No tenderness.     Comments: No edema  Skin:    General: Skin is warm and dry.     Findings: No rash.  Neurological:     Mental Status: She is alert and oriented to person, place, and time.     Cranial Nerves: No cranial nerve deficit.     Sensory: Sensation is intact.     Motor: No weakness, abnormal muscle tone or pronator drift.     Comments: Slight difficulty in heel-to-shin but is able to complete.  States it makes her feel more dizzy.  Gait not tested at this time due to patient symptoms  Psychiatric:        Behavior: Behavior normal.      ED Treatments / Results  Labs (all labs ordered are listed, but only abnormal results are displayed) Labs Reviewed  CBC WITH DIFFERENTIAL/PLATELET - Abnormal; Notable for the following components:      Result Value   Hemoglobin 10.9 (*)    MCH 25.9 (*)    MCHC 29.8 (*)    All other components within normal limits    COMPREHENSIVE METABOLIC PANEL - Abnormal; Notable for the following components:   Creatinine, Ser 1.04 (*)    Total Protein 6.4 (*)    All other components within normal limits  I-STAT BETA HCG BLOOD, ED ([facility], [facility], [facility] ONLY)    EKG None ED ECG REPORT   Date: 08/20/2018  Rate: 58  Rhythm: sinus bradycardia  QRS Axis: normal  Intervals: normal  ST/T Wave abnormalities: normal  Conduction Disutrbances:none  Narrative Interpretation:   Old EKG Reviewed: unchanged  I have personally reviewed the EKG tracing and agree with the computerized printout as noted.  Radiology Ct Angio Head W Or Wo Contrast  Result Date: 08/20/2018 CLINICAL DATA:  Persistent central vertigo EXAM: CT ANGIOGRAPHY HEAD AND NECK TECHNIQUE: Multidetector CT imaging of the head and neck was performed using the standard protocol during bolus administration of intravenous contrast. Multiplanar CT image reconstructions and MIPs were obtained to evaluate the vascular anatomy. Carotid stenosis measurements (when applicable) are obtained utilizing NASCET criteria, using the distal internal carotid diameter as the denominator. CONTRAST:  172mL ISOVUE-370 IOPAMIDOL (ISOVUE-370) INJECTION 76% COMPARISON:  05/13/2018 head CT FINDINGS: CT HEAD FINDINGS Brain: No evidence of acute infarction, hemorrhage, hydrocephalus, extra-axial collection or mass lesion/mass effect. Vascular: See below Skull: Negative Sinuses: Left ethmoid osteomas. Orbits: Negative Review of the MIP images confirms the above findings CTA NECK FINDINGS Aortic arch: Negative Right carotid system: Vessels are smooth and widely patent. Left carotid system: Vessels are smooth and widely patent Vertebral arteries: No proximal subclavian stenosis. Both vertebral arteries are widely patent to the dura. There is limited visualization of the right V1 segment due to the adjacent artifact from intravenous contrast. Skeleton: No acute finding Other  neck: Hypoplastic thyroid.  No  acute finding Upper chest: No acute finding Review of the MIP images confirms the above findings CTA HEAD FINDINGS Anterior circulation: Vessels are smooth and widely patent. No aneurysm. Azygos A2 segment. Posterior circulation: The vertebral and basilar arteries are smooth and widely patent. No branch occlusion, stenosis, beading, or aneurysm. Venous sinuses: Patent Anatomic variants: As above Delayed phase: No abnormal intracranial enhancement Review of the MIP images confirms the above findings IMPRESSION: Negative CTA of the head and neck. Electronically Signed   By: Monte Fantasia M.D.   On: 08/20/2018 12:58   Ct Angio Neck W And/or Wo Contrast  Result Date: 08/20/2018 CLINICAL DATA:  Persistent central vertigo EXAM: CT ANGIOGRAPHY HEAD AND NECK TECHNIQUE: Multidetector CT imaging of the head and neck was performed using the standard protocol during bolus administration of intravenous contrast. Multiplanar CT image reconstructions and MIPs were obtained to evaluate the vascular anatomy. Carotid stenosis measurements (when applicable) are obtained utilizing NASCET criteria, using the distal internal carotid diameter as the denominator. CONTRAST:  160mL ISOVUE-370 IOPAMIDOL (ISOVUE-370) INJECTION 76% COMPARISON:  05/13/2018 head CT FINDINGS: CT HEAD FINDINGS Brain: No evidence of acute infarction, hemorrhage, hydrocephalus, extra-axial collection or mass lesion/mass effect. Vascular: See below Skull: Negative Sinuses: Left ethmoid osteomas. Orbits: Negative Review of the MIP images confirms the above findings CTA NECK FINDINGS Aortic arch: Negative Right carotid system: Vessels are smooth and widely patent. Left carotid system: Vessels are smooth and widely patent Vertebral arteries: No proximal subclavian stenosis. Both vertebral arteries are widely patent to the dura. There is limited visualization of the right V1 segment due to the adjacent artifact from intravenous contrast. Skeleton: No acute finding  Other neck: Hypoplastic thyroid.  No acute finding Upper chest: No acute finding Review of the MIP images confirms the above findings CTA HEAD FINDINGS Anterior circulation: Vessels are smooth and widely patent. No aneurysm. Azygos A2 segment. Posterior circulation: The vertebral and basilar arteries are smooth and widely patent. No branch occlusion, stenosis, beading, or aneurysm. Venous sinuses: Patent Anatomic variants: As above Delayed phase: No abnormal intracranial enhancement Review of the MIP images confirms the above findings IMPRESSION: Negative CTA of the head and neck. Electronically Signed   By: Monte Fantasia M.D.   On: 08/20/2018 12:58    Procedures Procedures (including critical care time)  Medications Ordered in ED Medications  sodium chloride 0.9 % bolus 1,000 mL (has no administration in time range)     Initial Impression / Assessment and Plan / ED Course  I have reviewed the triage vital signs and the nursing notes.  Pertinent labs & imaging results that were available during my care of the patient were reviewed by me and considered in my medical decision making (see chart for details).    Patient presenting today with an accidental overdose.  She took her Synthroid and Lamictal last night before bed and then took it again at 430 this morning because she wanted to start taking it in the mornings.  She then developed dizziness, tongue feeling numb, nausea and just not feeling right.  She came here for further evaluation.  PA in the green zone discussed with poison control who recommended an EKG prior to any type of nausea medication as well as 4 to 6 hours of observation to monitor that patient symptoms improve.  Over the next 24 to 48 hours she may develop palpitations and hyper reactivity from the additional Synthroid dose.  Patient did not do this in  an attempt to hurt herself and low suspicion for SI. Also concern with patient's dizziness which is vertiginous in nature  headache and neck pain that this could be something unrelated to the medications.  Possibly just peripheral red vertigo but with the neck pain and headache we will do a CTA of the head and neck to rule out aneurysm or subarachnoid hemorrhage.  Symptoms started less than 6 hours ago.  Unable to appreciate nystagmus however exam is difficult because patient has a difficult time opening her eyes due to photophobia.  Patient was given meclizine, Reglan.  Labs including hCG and imaging pending  1:03 PM Labs show mild increase in creatinine to 1 but otherwise stable CBC and CMP.  CT was negative for acute process.  Will reevaluate the patient and continue monitoring for symptom improvement.  2:45 PM And is feeling much better now.  She was able to ambulate in the department without difficulty.  She will be discharged home to follow-up with her PCP. Final Clinical Impressions(s) / ED Diagnoses   Final diagnoses:  Accidental drug ingestion, initial encounter  Peripheral vertigo, unspecified laterality    ED Discharge Orders    None       Blanchie Dessert, MD 08/20/18 1446

## 2018-08-20 NOTE — Assessment & Plan Note (Signed)
Will check TSH. Synthroid script printed and handed to patient.

## 2018-08-20 NOTE — Discharge Instructions (Signed)
Not take any more of your medications till tomorrow morning.  If you continue to have headaches and dizziness continue following up with your doctor and possible ear nose and throat as planned.

## 2018-08-20 NOTE — Assessment & Plan Note (Signed)
PMS vs PMDD. Considered SSRI, however this is less preferred with comorbidity of bipolar disorder. Patient is already on lamictal. She has an appointment with psychiatry.  - Could also try an OCP but patient declines this as she is interested in becoming pregnant at some point in the future.  - Patient information on PMS and PMDD were printed.  - Mood log was printed for patient to record her symptoms and bring to her psychiatrist - will check TSH given change in menstrual pattern over the  past several months. Last TSH 5 months ago was WNL.

## 2018-08-20 NOTE — ED Notes (Signed)
Took  Her thyroid med  and lamictal at 6 pm last night  and then again at 4 am today , forgot that she had taken them last night , now dizzy esp when she walks and tongue was numb , head spinning started this am, headache, pt able to answer questions coherently

## 2018-08-20 NOTE — ED Notes (Signed)
Pt ambulated 138ft independently. No complaints of weakness, dizziness, or gait issues. Pt did state she had some sensitivity to light. No other complaints at this time.

## 2018-08-20 NOTE — ED Provider Notes (Addendum)
MSE was initiated and I personally evaluated the patient and placed orders (if any) at  10:05 AM on August 20, 2018.  Patient presenting today after accidentally taking 2 doses of her Lamictal and Synthroid.  She took this at 4:30 in the morning.  She is currently presenting with HA, dizziness, nausea, tongue numbness.  I called poison control, who recommends supportive treatment until symptoms improve.  Recommends observation for 4 to 6 hours.  Recommending EKG prior to giving antiemetics to check for safety.  Fluids for symptom control. Poison control states Synthroid overdose would not affect patient currently, in the next 48 to 72 hours she may develop hyperactivity or palpitations.  If so, she should remain well-hydrated, and continue supportive care.  PE: Gen: MM dry MSK: pt is ambulatory, but holding on for support due to dizziness.   The patient appears stable so that the remainder of the MSE may be completed by another provider.   Franchot Heidelberg, PA-C 08/20/18 [address] PA-C 08/20/18 1014    Blanchie Dessert, MD 08/20/18 [phone]

## 2018-08-22 ENCOUNTER — Encounter: Payer: Self-pay | Admitting: Student in an Organized Health Care Education/Training Program

## 2018-09-12 ENCOUNTER — Emergency Department (HOSPITAL_COMMUNITY)
Admission: EM | Admit: 2018-09-12 | Discharge: 2018-09-12 | Disposition: A | Discharge status: Home / Self Care | Payer: Medicare Other | Source: Home / Self Care | Attending: Emergency Medicine | Admitting: Emergency Medicine

## 2018-09-12 ENCOUNTER — Other Ambulatory Visit: Payer: Self-pay

## 2018-09-12 ENCOUNTER — Emergency Department (HOSPITAL_COMMUNITY)
Admission: EM | Admit: 2018-09-12 | Discharge: 2018-09-12 | Disposition: A | Discharge status: Home / Self Care | Payer: Medicare Other | Attending: Emergency Medicine | Admitting: Emergency Medicine

## 2018-09-12 ENCOUNTER — Encounter (HOSPITAL_COMMUNITY): Payer: Self-pay | Admitting: Emergency Medicine

## 2018-09-12 DIAGNOSIS — E039 Hypothyroidism, unspecified: Secondary | ICD-10-CM

## 2018-09-12 DIAGNOSIS — F411 Generalized anxiety disorder: Secondary | ICD-10-CM | POA: Insufficient documentation

## 2018-09-12 DIAGNOSIS — F332 Major depressive disorder, recurrent severe without psychotic features: Secondary | ICD-10-CM

## 2018-09-12 DIAGNOSIS — J45909 Unspecified asthma, uncomplicated: Secondary | ICD-10-CM

## 2018-09-12 DIAGNOSIS — Z79899 Other long term (current) drug therapy: Secondary | ICD-10-CM | POA: Insufficient documentation

## 2018-09-12 DIAGNOSIS — F419 Anxiety disorder, unspecified: Secondary | ICD-10-CM

## 2018-09-12 DIAGNOSIS — F319 Bipolar disorder, unspecified: Secondary | ICD-10-CM

## 2018-09-12 LAB — COMPREHENSIVE METABOLIC PANEL
ALBUMIN: 4.5 g/dL (ref 3.5–5.0)
ALT: 22 U/L (ref 0–44)
ANION GAP: 11 (ref 5–15)
AST: 21 U/L (ref 15–41)
Alkaline Phosphatase: 59 U/L (ref 38–126)
BUN: 6 mg/dL (ref 6–20)
CHLORIDE: 104 mmol/L (ref 98–111)
CO2: 23 mmol/L (ref 22–32)
Calcium: 9.9 mg/dL (ref 8.9–10.3)
Creatinine, Ser: 0.82 mg/dL (ref 0.44–1.00)
GFR calc non Af Amer: >60 mL/min (ref >60)
GLUCOSE: 99 mg/dL (ref 70–99)
Potassium: 3.7 mmol/L (ref 3.5–5.1)
SODIUM: 138 mmol/L (ref 135–145)
Total Bilirubin: 0.4 mg/dL (ref 0.3–1.2)
Total Protein: 7.5 g/dL (ref 6.5–8.1)

## 2018-09-12 LAB — CBC
HCT: 41 % (ref 36.0–46.0)
Hemoglobin: 12.1 g/dL (ref 12.0–15.0)
MCH: 25.5 pg — AB (ref 26.0–34.0)
MCHC: 29.5 g/dL — AB (ref 30.0–36.0)
MCV: 86.3 fL (ref 80.0–100.0)
PLATELETS: 366 10*3/uL (ref 150–400)
RBC: 4.75 MIL/uL (ref 3.87–5.11)
RDW: 14.1 % (ref 11.5–15.5)
WBC: 5.8 10*3/uL (ref 4.0–10.5)
nRBC: 0 % (ref 0.0–0.2)

## 2018-09-12 LAB — RAPID URINE DRUG SCREEN, HOSP PERFORMED
AMPHETAMINES: NOT DETECTED
BENZODIAZEPINES: NOT DETECTED
Barbiturates: NOT DETECTED
COCAINE: NOT DETECTED
OPIATES: NOT DETECTED
TETRAHYDROCANNABINOL: NOT DETECTED

## 2018-09-12 LAB — ETHANOL: Alcohol, Ethyl (B): <10 mg/dL (ref <10)

## 2018-09-12 MED ORDER — LORAZEPAM 1 MG PO TABS
1.0000 mg | ORAL_TABLET | Freq: Once | ORAL | Status: AC
  Administered 2018-09-12: 1 mg via ORAL
  Filled 2018-09-12: qty 1

## 2018-09-12 MED ORDER — LAMOTRIGINE 100 MG PO TABS
200.0000 mg | ORAL_TABLET | Freq: Once | ORAL | Status: AC
  Administered 2018-09-12: 200 mg via ORAL
  Filled 2018-09-12: qty 2

## 2018-09-12 NOTE — ED Notes (Signed)
Spoke with kendall at [facility] who states nothing further needed, no need for [facility] NP to round on  Patient, and patient safe to DC home.  Dr. Darl Householder made aware and has updated patient with plan of care to DC home.

## 2018-09-12 NOTE — ED Provider Notes (Signed)
[facility] EMERGENCY DEPARTMENT Provider Note   CSN: [id number] Arrival date & time: 09/12/18  0331   History   Chief Complaint Chief Complaint  Patient presents with  . Anxiety    HPI Colleen Peck is a 38 y.o. female.     HPI   38 year old female presents today with complaints of anxiety.  Patient will not provide any further information.  She denies any suicidal or homicidal ideation.  She is a history of anxiety, notes she felt anxious this morning, is no longer feeling anxious.  She notes she has stressful events going on but will not provide any further details.  She notes she has used Vistaril in the past but has not had any today.  She notes she does have a prescription at home.  She notes she sees a Teacher, music at [employer].    Past Medical History:  Diagnosis Date  . Abnormal pap    pt reports abnl pap many years ago.  Nl since then.  . Allergy    seasonal  . Anemia   . Asthma   . Bipolar disorder ([place])   . Depression   . Palpitations 03/12/2008  . PTSD (post-traumatic stress disorder)   . Seasonal allergies   . Thyroid disease 2009   Graves disease (pt reported resolved); hypothyriodism    Patient Active Problem List   Diagnosis Date Noted  . PMS (premenstrual syndrome) 08/20/2018  . Vertigo 05/31/2018  . Numbness and tingling of both feet 03/21/2018  . Hair loss 03/21/2018  . Urticaria 12/15/2017  . Change in bowel habits 09/01/2017  . Rectal pain 09/01/2017  . Lower abdominal pain 09/01/2017  . Cold sore 08/08/2017  . Dermatofibroma of right lower leg 01/06/2017  . History of borderline personality disorder 01/01/2016  . Carotidynia 12/16/2015  . Schizoaffective disorder, bipolar type ([place]) 11/26/2015  . Anxiety   . Pain of left calf 11/10/2015  . PTSD (post-traumatic stress disorder) 09/30/2015  . Paranoia ([place])   . Anemia 01/30/2015  . Vitamin D deficiency 05/22/2013  . Hypothyroidism (acquired) 11/29/2011  . Obesity  09/08/2006  . Allergic rhinitis 09/08/2006  . Asthma 09/08/2006  . ECZEMA, ATOPIC DERMATITIS 09/08/2006    Past Surgical History:  Procedure Laterality Date  . DILATION AND CURETTAGE OF UTERUS  March 2006     OB History   No obstetric history on file.      Home Medications    Prior to Admission medications   Medication Sig Start Date End Date Taking? Authorizing Provider  albuterol (PROVENTIL HFA;VENTOLIN HFA) 108 (90 Base) MCG/ACT inhaler Inhale 2 puffs into the lungs every 6 (six) hours as needed for wheezing or shortness of breath. 03/21/18   Meccariello, Bernita Raisin, DO  albuterol (PROVENTIL) (2.5 MG/3ML) 0.083% nebulizer solution Take 3 mLs (2.5 mg total) by nebulization every 6 (six) hours as needed for wheezing or shortness of breath. 11/24/17   Nicolette Bang, DO  azithromycin (ZITHROMAX) 250 MG tablet Take 2 pills today and then 1 pill daily after that. 07/18/18   Alveda Reasons, MD  benzonatate (TESSALON) 100 MG capsule Take 1 capsule (100 mg total) by mouth 2 (two) times daily as needed for cough. 07/18/18   Alveda Reasons, MD  Cholecalciferol 800 UNIT (20 MCG) TABS Take 800 Units by mouth daily. 03/22/18   Meccariello, Bernita Raisin, DO  fluticasone (FLONASE) 50 MCG/ACT nasal spray Place 2 sprays into both nostrils daily. 07/14/18   Martyn Malay, MD  fluticasone (FLOVENT HFA) 110 MCG/ACT inhaler Inhale 1 puff into the lungs 2 (two) times daily. 12/16/17   Nicolette Bang, DO  hydrOXYzine (ATARAX/VISTARIL) 25 MG tablet Take 1 tablet (25 mg total) by mouth every 6 (six) hours as needed for anxiety or itching. 12/15/17   Donnamae Jude, MD  lamoTRIgine (LAMICTAL) 200 MG tablet Take 1 tablet (200 mg total) by mouth daily. Take 1 tablet (25 mg) in the morning & 2 tablets (50 mg) in the evening: For mood stabilization 11/24/17   Nicolette Bang, DO  levothyroxine (SYNTHROID) 112 MCG tablet Take 2 tablets (224 mcg) by mouth each morning before breakfast. 08/18/18    Everrett Coombe, MD  methocarbamol (ROBAXIN) 500 MG tablet Take 1 tablet (500 mg total) by mouth 2 (two) times daily. 05/13/18   Volanda Napoleon, PA-C    Family History Family History  Problem Relation Age of Onset  . Drug abuse Father   . Depression Maternal Grandmother   . Anxiety disorder Maternal Grandmother   . COPD Maternal Grandmother   . Suicidality Cousin   . Depression Cousin   . Bipolar disorder Cousin   . Hypertension Mother   . Depression Mother   . Diabetes Paternal Grandfather   . COPD Paternal Grandmother   . Depression Maternal Aunt   . Breast cancer Maternal Aunt   . Depression Maternal Aunt   . Heart disease Neg Hx     Social History Social History   Tobacco Use  . Smoking status: Never Smoker  . Smokeless tobacco: Never Used  Substance Use Topics  . Alcohol use: No  . Drug use: Yes    Types: Marijuana    Comment: past use of marijuana in '08-'09. occasional eats brownies w/ marijuana  before thanksgiving     Allergies   Kiwi extract and Pineapple   Review of Systems Review of Systems  All other systems reviewed and are negative.  Physical Exam Updated Vital Signs BP 114/85 (BP Location: Right Arm)   Pulse 82   Temp 98.1 F (36.7 C) (Oral)   Resp 17   Ht 5\' 7"  (1.702 m)   Wt 108.9 kg   LMP 08/16/2018 (Approximate)   SpO2 100%   BMI 37.59 kg/m   Physical Exam Vitals signs and nursing note reviewed.  Constitutional:      Appearance: She is well-developed.  HENT:     Head: Normocephalic and atraumatic.  Eyes:     General: No scleral icterus.       Right eye: No discharge.        Left eye: No discharge.     Conjunctiva/sclera: Conjunctivae normal.     Pupils: Pupils are equal, round, and reactive to light.  Neck:     Musculoskeletal: Normal range of motion.     Vascular: No JVD.     Trachea: No tracheal deviation.  Pulmonary:     Effort: Pulmonary effort is normal.     Breath sounds: No stridor.  Neurological:     Mental  Status: She is alert and oriented to person, place, and time.     Coordination: Coordination normal.  Psychiatric:        Behavior: Behavior normal.        Thought Content: Thought content normal.        Judgment: Judgment normal.      ED Treatments / Results  Labs (all labs ordered are listed, but only abnormal results are displayed) Labs Reviewed - No  data to display  EKG None  Radiology No results found.  Procedures Procedures (including critical care time)  Medications Ordered in ED Medications - No data to display   Initial Impression / Assessment and Plan / ED Course  I have reviewed the triage vital signs and the nursing notes.  Pertinent labs & imaging results that were available during my care of the patient were reviewed by me and considered in my medical decision making (see chart for details).        38 year old female presents today with anxiety.  She notes that she is no longer anxious and would just like a work note as she missed work.  Has no other physical complaints.  She has outpatient follow-up resources.    Final Clinical Impressions(s) / ED Diagnoses   Final diagnoses:  Anxiety    ED Discharge Orders    None       Francee Gentile 09/12/18 Wonda Amis    Charlesetta Shanks, MD 09/12/18 [phone]

## 2018-09-12 NOTE — ED Provider Notes (Addendum)
[facility] EMERGENCY DEPARTMENT Provider Note   CSN: [id number] Arrival date & time: 09/12/18  0906    History   Chief Complaint Chief Complaint  Patient presents with  . Psychiatric Evaluation    HPI Colleen Peck is a 38 y.o. female.     Patient with hx bipolar disorder, presents with anxiety and strange/unusual behavior. Patient uncooperative historian, not answering vast majority of questions - level 5 caveat.  No report of pain. No trauma/fall/injury reported. Parent notes perhaps increased stress related to work. Pt reports seeing her psychiatrist last week, and not feeling better.   The history is provided by the patient and the spouse. The history is limited by the condition of the patient.    Past Medical History:  Diagnosis Date  . Abnormal pap    pt reports abnl pap many years ago.  Nl since then.  . Allergy    seasonal  . Anemia   . Asthma   . Bipolar disorder ([place])   . Depression   . Palpitations 03/12/2008  . PTSD (post-traumatic stress disorder)   . Seasonal allergies   . Thyroid disease 2009   Graves disease (pt reported resolved); hypothyriodism    Patient Active Problem List   Diagnosis Date Noted  . PMS (premenstrual syndrome) 08/20/2018  . Vertigo 05/31/2018  . Numbness and tingling of both feet 03/21/2018  . Hair loss 03/21/2018  . Urticaria 12/15/2017  . Change in bowel habits 09/01/2017  . Rectal pain 09/01/2017  . Lower abdominal pain 09/01/2017  . Cold sore 08/08/2017  . Dermatofibroma of right lower leg 01/06/2017  . History of borderline personality disorder 01/01/2016  . Carotidynia 12/16/2015  . Schizoaffective disorder, bipolar type ([place]) 11/26/2015  . Anxiety   . Pain of left calf 11/10/2015  . PTSD (post-traumatic stress disorder) 09/30/2015  . Paranoia ([place])   . Anemia 01/30/2015  . Vitamin D deficiency 05/22/2013  . Hypothyroidism (acquired) 11/29/2011  . Obesity 09/08/2006  . Allergic rhinitis  09/08/2006  . Asthma 09/08/2006  . ECZEMA, ATOPIC DERMATITIS 09/08/2006    Past Surgical History:  Procedure Laterality Date  . DILATION AND CURETTAGE OF UTERUS  March 2006     OB History   No obstetric history on file.      Home Medications    Prior to Admission medications   Medication Sig Start Date End Date Taking? Authorizing Provider  albuterol (PROVENTIL HFA;VENTOLIN HFA) 108 (90 Base) MCG/ACT inhaler Inhale 2 puffs into the lungs every 6 (six) hours as needed for wheezing or shortness of breath. 03/21/18   Meccariello, Bernita Raisin, DO  albuterol (PROVENTIL) (2.5 MG/3ML) 0.083% nebulizer solution Take 3 mLs (2.5 mg total) by nebulization every 6 (six) hours as needed for wheezing or shortness of breath. 11/24/17   Nicolette Bang, DO  azithromycin (ZITHROMAX) 250 MG tablet Take 2 pills today and then 1 pill daily after that. 07/18/18   Alveda Reasons, MD  benzonatate (TESSALON) 100 MG capsule Take 1 capsule (100 mg total) by mouth 2 (two) times daily as needed for cough. 07/18/18   Alveda Reasons, MD  Cholecalciferol 800 UNIT (20 MCG) TABS Take 800 Units by mouth daily. 03/22/18   Meccariello, Bernita Raisin, DO  fluticasone (FLONASE) 50 MCG/ACT nasal spray Place 2 sprays into both nostrils daily. 07/14/18   Martyn Malay, MD  fluticasone (FLOVENT HFA) 110 MCG/ACT inhaler Inhale 1 puff into the lungs 2 (two) times daily. 12/16/17   Nicolette Bang,  DO  hydrOXYzine (ATARAX/VISTARIL) 25 MG tablet Take 1 tablet (25 mg total) by mouth every 6 (six) hours as needed for anxiety or itching. 12/15/17   Donnamae Jude, MD  lamoTRIgine (LAMICTAL) 200 MG tablet Take 1 tablet (200 mg total) by mouth daily. Take 1 tablet (25 mg) in the morning & 2 tablets (50 mg) in the evening: For mood stabilization 11/24/17   Nicolette Bang, DO  levothyroxine (SYNTHROID) 112 MCG tablet Take 2 tablets (224 mcg) by mouth each morning before breakfast. 08/18/18   Everrett Coombe, MD  methocarbamol  (ROBAXIN) 500 MG tablet Take 1 tablet (500 mg total) by mouth 2 (two) times daily. 05/13/18   Volanda Napoleon, PA-C    Family History Family History  Problem Relation Age of Onset  . Drug abuse Father   . Depression Maternal Grandmother   . Anxiety disorder Maternal Grandmother   . COPD Maternal Grandmother   . Suicidality Cousin   . Depression Cousin   . Bipolar disorder Cousin   . Hypertension Mother   . Depression Mother   . Diabetes Paternal Grandfather   . COPD Paternal Grandmother   . Depression Maternal Aunt   . Breast cancer Maternal Aunt   . Depression Maternal Aunt   . Heart disease Neg Hx     Social History Social History   Tobacco Use  . Smoking status: Never Smoker  . Smokeless tobacco: Never Used  Substance Use Topics  . Alcohol use: No  . Drug use: Yes    Types: Marijuana    Comment: past use of marijuana in '08-'09. occasional eats brownies w/ marijuana  before thanksgiving     Allergies   Kiwi extract and Pineapple   Review of Systems Review of Systems  Unable to perform ROS: Psychiatric disorder  level 5 caveat - psychiatric disorder, uncooperative.    Physical Exam Updated Vital Signs LMP 08/16/2018 (Approximate)   Physical Exam Vitals signs and nursing note reviewed.  Constitutional:      Appearance: Normal appearance. She is well-developed.  HENT:     Head: Atraumatic.     Nose: Nose normal.     Mouth/Throat:     Mouth: Mucous membranes are moist.  Eyes:     General: No scleral icterus.    Conjunctiva/sclera: Conjunctivae normal.     Pupils: Pupils are equal, round, and reactive to light.  Neck:     Musculoskeletal: Normal range of motion and neck supple. No neck rigidity or muscular tenderness.     Trachea: No tracheal deviation.  Cardiovascular:     Rate and Rhythm: Normal rate and regular rhythm.     Pulses: Normal pulses.     Heart sounds: Normal heart sounds. No murmur. No friction rub. No gallop.   Pulmonary:      Effort: Pulmonary effort is normal. No respiratory distress.     Breath sounds: Normal breath sounds.  Abdominal:     General: Bowel sounds are normal. There is no distension.     Palpations: Abdomen is soft.     Tenderness: There is no abdominal tenderness.  Genitourinary:    Comments: No cva tenderness.  Musculoskeletal:        General: No swelling.  Skin:    General: Skin is warm and dry.     Findings: No rash.  Neurological:     Mental Status: She is alert.     Comments: Alert, speech normal. Talking on phone. Patient very anxious. Motor/sens grossly intact.  Steady gait.   Psychiatric:     Comments: Very anxious. Appears paranoid, easily agitated.       ED Treatments / Results  Labs (all labs ordered are listed, but only abnormal results are displayed) Results for orders placed or performed during the hospital encounter of 09/12/18  CBC  Result Value Ref Range   WBC 5.8 4.0 - 10.5 K/uL   RBC 4.75 3.87 - 5.11 MIL/uL   Hemoglobin 12.1 12.0 - 15.0 g/dL   HCT 41.0 36.0 - 46.0 %   MCV 86.3 80.0 - 100.0 fL   MCH 25.5 (L) 26.0 - 34.0 pg   MCHC 29.5 (L) 30.0 - 36.0 g/dL   RDW 14.1 11.5 - 15.5 %   Platelets 366 150 - 400 K/uL   nRBC 0.0 0.0 - 0.2 %  Comprehensive metabolic panel  Result Value Ref Range   Sodium 138 135 - 145 mmol/L   Potassium 3.7 3.5 - 5.1 mmol/L   Chloride 104 98 - 111 mmol/L   CO2 23 22 - 32 mmol/L   Glucose, Bld 99 70 - 99 mg/dL   BUN 6 6 - 20 mg/dL   Creatinine, Ser 0.82 0.44 - 1.00 mg/dL   Calcium 9.9 8.9 - 10.3 mg/dL   Total Protein 7.5 6.5 - 8.1 g/dL   Albumin 4.5 3.5 - 5.0 g/dL   AST 21 15 - 41 U/L   ALT 22 0 - 44 U/L   Alkaline Phosphatase 59 38 - 126 U/L   Total Bilirubin 0.4 0.3 - 1.2 mg/dL   GFR calc non Af Amer >60 >60 mL/min   GFR calc Af Amer >60 >60 mL/min   Anion gap 11 5 - 15  Ethanol  Result Value Ref Range   Alcohol, Ethyl (B) <10 <10 mg/dL  Rapid urine drug screen (hospital performed)  Result Value Ref Range   Opiates  NONE DETECTED NONE DETECTED   Cocaine NONE DETECTED NONE DETECTED   Benzodiazepines NONE DETECTED NONE DETECTED   Amphetamines NONE DETECTED NONE DETECTED   Tetrahydrocannabinol NONE DETECTED NONE DETECTED   Barbiturates NONE DETECTED NONE DETECTED   Ct Angio Head W Or Wo Contrast  Result Date: 08/20/2018 CLINICAL DATA:  Persistent central vertigo EXAM: CT ANGIOGRAPHY HEAD AND NECK TECHNIQUE: Multidetector CT imaging of the head and neck was performed using the standard protocol during bolus administration of intravenous contrast. Multiplanar CT image reconstructions and MIPs were obtained to evaluate the vascular anatomy. Carotid stenosis measurements (when applicable) are obtained utilizing NASCET criteria, using the distal internal carotid diameter as the denominator. CONTRAST:  161mL ISOVUE-370 IOPAMIDOL (ISOVUE-370) INJECTION 76% COMPARISON:  05/13/2018 head CT FINDINGS: CT HEAD FINDINGS Brain: No evidence of acute infarction, hemorrhage, hydrocephalus, extra-axial collection or mass lesion/mass effect. Vascular: See below Skull: Negative Sinuses: Left ethmoid osteomas. Orbits: Negative Review of the MIP images confirms the above findings CTA NECK FINDINGS Aortic arch: Negative Right carotid system: Vessels are smooth and widely patent. Left carotid system: Vessels are smooth and widely patent Vertebral arteries: No proximal subclavian stenosis. Both vertebral arteries are widely patent to the dura. There is limited visualization of the right V1 segment due to the adjacent artifact from intravenous contrast. Skeleton: No acute finding Other neck: Hypoplastic thyroid.  No acute finding Upper chest: No acute finding Review of the MIP images confirms the above findings CTA HEAD FINDINGS Anterior circulation: Vessels are smooth and widely patent. No aneurysm. Azygos A2 segment. Posterior circulation: The vertebral and basilar arteries are smooth and  widely patent. No branch occlusion, stenosis, beading, or  aneurysm. Venous sinuses: Patent Anatomic variants: As above Delayed phase: No abnormal intracranial enhancement Review of the MIP images confirms the above findings IMPRESSION: Negative CTA of the head and neck. Electronically Signed   By: Monte Fantasia M.D.   On: 08/20/2018 12:58   Ct Angio Neck W And/or Wo Contrast  Result Date: 08/20/2018 CLINICAL DATA:  Persistent central vertigo EXAM: CT ANGIOGRAPHY HEAD AND NECK TECHNIQUE: Multidetector CT imaging of the head and neck was performed using the standard protocol during bolus administration of intravenous contrast. Multiplanar CT image reconstructions and MIPs were obtained to evaluate the vascular anatomy. Carotid stenosis measurements (when applicable) are obtained utilizing NASCET criteria, using the distal internal carotid diameter as the denominator. CONTRAST:  123mL ISOVUE-370 IOPAMIDOL (ISOVUE-370) INJECTION 76% COMPARISON:  05/13/2018 head CT FINDINGS: CT HEAD FINDINGS Brain: No evidence of acute infarction, hemorrhage, hydrocephalus, extra-axial collection or mass lesion/mass effect. Vascular: See below Skull: Negative Sinuses: Left ethmoid osteomas. Orbits: Negative Review of the MIP images confirms the above findings CTA NECK FINDINGS Aortic arch: Negative Right carotid system: Vessels are smooth and widely patent. Left carotid system: Vessels are smooth and widely patent Vertebral arteries: No proximal subclavian stenosis. Both vertebral arteries are widely patent to the dura. There is limited visualization of the right V1 segment due to the adjacent artifact from intravenous contrast. Skeleton: No acute finding Other neck: Hypoplastic thyroid.  No acute finding Upper chest: No acute finding Review of the MIP images confirms the above findings CTA HEAD FINDINGS Anterior circulation: Vessels are smooth and widely patent. No aneurysm. Azygos A2 segment. Posterior circulation: The vertebral and basilar arteries are smooth and widely patent. No  branch occlusion, stenosis, beading, or aneurysm. Venous sinuses: Patent Anatomic variants: As above Delayed phase: No abnormal intracranial enhancement Review of the MIP images confirms the above findings IMPRESSION: Negative CTA of the head and neck. Electronically Signed   By: Monte Fantasia M.D.   On: 08/20/2018 12:58    EKG None  Radiology No results found.  Procedures Procedures (including critical care time)  Medications Ordered in ED Medications - No data to display   Initial Impression / Assessment and Plan / ED Course  I have reviewed the triage vital signs and the nursing notes.  Pertinent labs & imaging results that were available during my care of the patient were reviewed by me and considered in my medical decision making (see chart for details).  Labs sent. [facility] team consulted.   Reviewed nursing notes and prior charts for additional history.   Labs reviewed - chem normal.  BH eval pending.  Given 2 ED visits in one morning, anxiety issues with very odd behavior, ?issues related to bipolar disorder, etc, will ask [facility] team to evaluate.  [facility]position [facility].   Signed out to Dr Darl Householder to f/u with [facility], recheck pt, and dispo appropriately.   Final Clinical Impressions(s) / ED Diagnoses   Final diagnoses:  None    ED Discharge Orders    None           Lajean Saver, MD 09/12/18 1549
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
## 2018-09-12 NOTE — ED Notes (Signed)
Mother, stefanny pieri, called. Would like daughter to call her when she is able

## 2018-09-12 NOTE — ED Provider Notes (Signed)
  Physical Exam  Ht 5\' 7"  (1.702 m)   Wt 108.9 kg   LMP 08/16/2018 (Approximate)   SpO2 100%   BMI 37.60 kg/m   Physical Exam  ED Course/Procedures     Procedures  MDM  Care assumed at 4 pm from Dr. Ashok Cordia. Patient here with anxiety. Labs unremarkable. Sign out pending TTS consult.   5:13 PM Counselor talked to Delilah Shan, NP at [facility], who states that patient doesn't meet inpatient criteria and can be discharged. She is very anxious so will give ativan and home dose of lamictal prior to discharge.     Drenda Freeze, MD 09/12/18 [phone]

## 2018-09-12 NOTE — Progress Notes (Signed)
Disposition CSW contacted ED and spoke to [place], [place], as it appears that patient was d/c'd earlier this morning.  Apparently patient returned to the ED shortly after she was discharged and is being seen again.  Patient's RN agrees to contact TTS, after consulting with EDP, and will advise if EDP is asking for a TTS consult.  Areatha Keas. Judi Cong, MSW, [place] Dis[facility] Clinical Social Work [phone] (cell) [phone] (office)

## 2018-09-12 NOTE — ED Triage Notes (Signed)
Pt reports anxiety X several weeks, pt noted to be withdrawn in triage. Denies SI/HI.

## 2018-09-12 NOTE — ED Notes (Signed)
Message sent to Dr Ashok Cordia re: delay w/dispo as SW has attempted unsuccessfully to reach NP [facility].

## 2018-09-12 NOTE — Discharge Instructions (Signed)
Please read attached information. If you experience any new or worsening signs or symptoms please return to the emergency room for evaluation. Please follow-up with your primary care provider or specialist as discussed.  °

## 2018-09-12 NOTE — BH Assessment (Signed)
Pt seen by TTS waiting for disposition from [place] NP.

## 2018-09-12 NOTE — ED Notes (Signed)
Patient verbalizes understanding of discharge instructions. Opportunity for questioning and answers were provided. Armband removed by staff, pt discharged from ED. Ambulated out to lobby, awaiting for transport home by mom

## 2018-09-12 NOTE — ED Notes (Signed)
Mom called, given brief update on daughter - is speaking to pt at this time

## 2018-09-12 NOTE — Discharge Instructions (Addendum)
Please take your medicines, especially your hydroxyzine and lamictal as prescribed   See your counselor this week   Return to ER if you have thoughts of harming yourself or others, hallucinations.

## 2018-10-02 ENCOUNTER — Telehealth: Payer: Self-pay | Admitting: Family Medicine

## 2018-10-02 NOTE — Telephone Encounter (Signed)
**  After Hours/ Emergency Line Call*  Received a call to report that Colleen Peck "is going through an anxiety episode". Her mother is calling on her behalf. Endorsing inability to sleep, worry, fear for her family.  Denying any SI/HI. She is safe at home with her mother. Recommended that she take one of her as needed hydroxyzine for anxiety.  Mom is agreeable. Red flags discussed.  Will forward to PCP.  Steve Rattler, DO PGY-3, [facility]

## 2018-10-04 ENCOUNTER — Telehealth: Payer: Self-pay | Admitting: Family Medicine

## 2018-10-04 NOTE — Telephone Encounter (Signed)
Patient notes that she recently changed her thyroid medication from taking it at night to taking in the morning.  She states that she has been taking it religiously, which she was not doing before.  She states that since doing this her heart has been racing occasionally while walking.  She states that this is been happening over the last week.  She denies any chest pain that is associated with it.  She does say that her throat gets tight with this, but denies a sore throat.  She denies any shortness of breath or fevers.  She also states that she was seen at her psychiatrist earlier in the week and was told that she lost 20 pounds since her last visit.  She would like to be seen for this heart racing in person and would like her thyroid levels checked to see if adjustments need to be made for her medications.  She was scheduled for an appointment with myself, her PCP, tomorrow 3/26 at 10:45 AM.  Patient was given appropriate return precautions to the ED including chest pain, syncope.

## 2018-10-05 ENCOUNTER — Encounter: Payer: Self-pay | Admitting: Family Medicine

## 2018-10-05 ENCOUNTER — Other Ambulatory Visit: Payer: Self-pay

## 2018-10-05 ENCOUNTER — Ambulatory Visit (INDEPENDENT_AMBULATORY_CARE_PROVIDER_SITE_OTHER): Payer: Medicare Other | Admitting: Family Medicine

## 2018-10-05 VITALS — BP 102/62 | HR 79 | Temp 99.2°F | Wt 223.1 lb

## 2018-10-05 DIAGNOSIS — N644 Mastodynia: Secondary | ICD-10-CM | POA: Insufficient documentation

## 2018-10-05 DIAGNOSIS — E039 Hypothyroidism, unspecified: Secondary | ICD-10-CM | POA: Diagnosis not present

## 2018-10-05 DIAGNOSIS — R599 Enlarged lymph nodes, unspecified: Secondary | ICD-10-CM | POA: Insufficient documentation

## 2018-10-05 DIAGNOSIS — R002 Palpitations: Secondary | ICD-10-CM | POA: Diagnosis present

## 2018-10-05 DIAGNOSIS — E559 Vitamin D deficiency, unspecified: Secondary | ICD-10-CM | POA: Diagnosis not present

## 2018-10-05 NOTE — Assessment & Plan Note (Signed)
Patient concerned about left axillary palpable lymph node, but on exam also felt on right underarm.  Both of these lymph nodes are about 1 cm x 1 cm and nontender as well as movable.  This is reassuring and are likely just normal lymph nodes. -Return precautions given to patient, including enlargement of these lymph nodes, and pain in the area as well as skin changes  Discussed with Dr. Wendy Poet

## 2018-10-05 NOTE — Assessment & Plan Note (Signed)
Last vitamin D 03/21/2018 was 25.  Patient is still not taking her prescribed vitamin D 800 units daily.  Advised patient that she can pick this up at her pharmacy and that she should be taking it daily.  Discussed that it was best absorbed with a fatty meal and that would probably be easier for her to take at night. -Take vitamin D 800 units daily -Plan to recheck vitamin D at next visit if patient is taking

## 2018-10-05 NOTE — Assessment & Plan Note (Signed)
No concerns on patient's breast exam.  Given that the pain is associated with her menstruation, likely cyclical mastalgia.  No red flag symptoms.  Patient has a maternal aunt who has a history of breast cancer. -Return precautions given to patient, voiced understanding

## 2018-10-05 NOTE — Assessment & Plan Note (Signed)
Last TSH was 1.14 on 2/7.  Takes Synthroid 224 mcg QAM now.  Patient was advised on appropriate use of her thyroid medication.  May change dosing depending on TSH results given patient's recent weight loss and heart palpitations. -Continue Synthroid 224 mcg QAM -Check TSH

## 2018-10-05 NOTE — Patient Instructions (Addendum)
Thank you for coming to see me today. It was a pleasure. Today we talked about:   Take Vitamin D 800 units, take at night after a fatty meal.  If you lab results are abnormal and we need to make adjustments to your medications, I will call you.  If you have any questions or concerns, please do not hesitate to call the office at [phone].  Best,   [place] Constable, DO

## 2018-10-05 NOTE — Assessment & Plan Note (Addendum)
Suspect that given patient's heart palpitations and recent weight loss in the setting of taking her thyroid medication appropriately, her Synthroid will likely need adjusted.  Differential also includes possible electrolyte abnormalities and anemia, therefore will obtain other blood work to work this up as well.  Patient's pulse within normal limits on exam and heart exam benign.  Most recent EKG on 08/21/2018 which showed sinus bradycardia without arrhythmia.  Patient does not have any chest pain associated with this and has no symptoms at rest, therefore will hold off on repeat EKG. -Check TSH, BMP, CBC, magnesium -Return precautions given to patient, voiced understanding  Discussed with Dr. Mingo Amber

## 2018-10-05 NOTE — Progress Notes (Signed)
Subjective: No chief complaint on file.    HPI: Colleen Peck is a 38 y.o. presenting to clinic today to discuss the following:  1 Heart Palpitations Patient has a history of hypothyroidism secondary to radioablation 2011 for Graves' disease.  Patient states that on 2/17 she started to take her thyroid medication differently.  She had been taking it prior to bed, at different times every day.  She states that on 2/17 she started taking at 7 AM religiously every day.  She states that about a week after she started doing this, she noticed that she was having some heart palpitations while walking up the stairs.  She denies any at rest.  She denies any chest pain or shortness of breath associated with this.  She said that her neck "feels tight" when this occurs.  She denies any diaphoresis.  She states that her psychiatrist told her she had lost about 20 pounds in a month at her visit on 3/23.  Per chart review, patient has lost 17 pounds since 3/3.  She states that she has been making an effort to eat more throughout the day, as she usually does not eat during the day, but otherwise has not changed any diet or exercise.  Her anxiety has been worsening recently, but states that she has been seeing her psychiatrist most recently 3 days ago.  She states that her psychiatrist does not believe that this anxiety coincides with no change in her thyroid medication.  She states that her anxiety was worsening prior to this.  She states "I have a very toxic work environment."  2 Left breast pain  Patient states that about 4 to 5 days ago, she started to have some pain under her left nipple.  She states that she did not feel a bump there, but it was tender to palpation.  LMP 3/23.  Patient is currently menstruating.  She states she still continues to have some tenderness to palpation.  She states that she has not had painful breast with periods in "a long time."  She also states that she has been told that she  has fibrous breast tissue.  She does not note any new nipple discharge, but states that since her nipples are pierced occasionally she will get "cartilage" come out if she squeezes.  She denies any skin changes over the area.  Patient has a family history of maternal aunt with breast cancer.  She denies any recent fevers or chills.  3 Left bump under arm Patient states about 2 to 3 weeks ago, she noticed a bump under her left underarm.  She states that she walks for the first time and afterwards when she touched the area she thought that she felt a bump under her arm.  She states that it seems to be movable and is not tender.  She states that she was just worried about this because "I heard if your breast cancer is under your arm."  4 Vitamin D Deficiency  Patient has a history of known vitamin D deficiency.  Last checked on 9/10 and was 25.  She was prescribed supplements, but then states when she went to the pharmacy she was told that she would need to get them over-the-counter.  Patient has never gotten them over-the-counter and has not used them.     ROS noted in HPI.   Past Medical, Surgical, Social, and Family History Reviewed & Updated per EMR.   Pertinent Historical Findings include:  Social History   Tobacco Use  Smoking Status Never Smoker  Smokeless Tobacco Never Used      Objective: BP 102/62   Pulse 79   Temp 99.2 F (37.3 C) (Oral)   Wt 223 lb 2 oz (101.2 kg)   SpO2 99%   BMI 34.95 kg/m  Vitals and nursing notes reviewed  Physical Exam: General: 38 y.o. female in NAD Neck: Supple, no lymphadenopathy, no thyromegaly Cardio: RRR no m/r/g Lungs: CTAB, no wheezing, no rhonchi, no crackles Skin: warm and dry Extremities: No edema Physical Exam Chest:     Breasts:        Right: Normal.        Left: Tenderness (mild, in left breast inferior to nipple, no pinpointed location) present. No swelling, mass, nipple discharge or skin change.     Comments: 1 palpable  axillary lymph node in both left and right axilla, movable, non-tender to palpation and about 1cm x 1 cm without overlying skin changes Lymphadenopathy:     Upper Body:     Right upper body: Axillary adenopathy present.     Left upper body: Axillary adenopathy present.  Exam conducted with a chaperone present.      No results found for this or any previous visit (from the past 72 hour(s)).  Assessment/Plan:  Heart palpitations Suspect that given patient's heart palpitations and recent weight loss in the setting of taking her thyroid medication appropriately, her Synthroid will likely need adjusted.  Differential also includes possible electrolyte abnormalities and anemia, therefore will obtain other blood work to work this up as well.  Patient's pulse within normal limits on exam and heart exam benign.  Most recent EKG on 08/21/2018 which showed sinus bradycardia without arrhythmia.  Patient does not have any chest pain associated with this and has no symptoms at rest, therefore will hold off on repeat EKG. -Check TSH, BMP, CBC, magnesium -Return precautions given to patient, voiced understanding  Discussed with Dr. Mingo Amber  Hypothyroidism (acquired) Last TSH was 1.14 on 2/7.  Takes Synthroid 224 mcg QAM now.  Patient was advised on appropriate use of her thyroid medication.  May change dosing depending on TSH results given patient's recent weight loss and heart palpitations. -Continue Synthroid 224 mcg QAM -Check TSH  Cyclical mastalgia No concerns on patient's breast exam.  Given that the pain is associated with her menstruation, likely cyclical mastalgia.  No red flag symptoms.  Patient has a maternal aunt who has a history of breast cancer. -Return precautions given to patient, voiced understanding  Palpable lymph node Patient concerned about left axillary palpable lymph node, but on exam also felt on right underarm.  Both of these lymph nodes are about 1 cm x 1 cm and nontender as  well as movable.  This is reassuring and are likely just normal lymph nodes. -Return precautions given to patient, including enlargement of these lymph nodes, and pain in the area as well as skin changes  Discussed with Dr. McDiarmid  Vitamin D deficiency Last vitamin D 03/21/2018 was 2.  Patient is still not taking her prescribed vitamin D 800 units daily.  Advised patient that she can pick this up at her pharmacy and that she should be taking it daily.  Discussed that it was best absorbed with a fatty meal and that would probably be easier for her to take at night. -Take vitamin D 800 units daily -Plan to recheck vitamin D at next visit if patient is taking  PATIENT EDUCATION PROVIDED: See AVS    Diagnosis and plan along with any newly prescribed medication(s) were discussed in detail with this patient today. The patient verbalized understanding and agreed with the plan. Patient advised if symptoms worsen return to clinic or ER.     Orders Placed This Encounter  Procedures  . TSH  . CBC with Differential  . Basic Metabolic Panel  . Magnesium    No orders of the defined types were placed in this encounter.    [place] Constable, DO 10/05/2018, 12:18 PM PGY-1 [place]

## 2018-10-06 ENCOUNTER — Telehealth: Payer: Self-pay | Admitting: Family Medicine

## 2018-10-06 ENCOUNTER — Encounter: Payer: Self-pay | Admitting: Family Medicine

## 2018-10-06 DIAGNOSIS — E039 Hypothyroidism, unspecified: Secondary | ICD-10-CM

## 2018-10-06 LAB — BASIC METABOLIC PANEL
BUN/Creatinine Ratio: 8 — ABNORMAL LOW (ref 9–23)
BUN: 8 mg/dL (ref 6–20)
CALCIUM: 10 mg/dL (ref 8.7–10.2)
CHLORIDE: 99 mmol/L (ref 96–106)
CO2: 27 mmol/L (ref 20–29)
Creatinine, Ser: 0.97 mg/dL (ref 0.57–1.00)
GFR calc Af Amer: 87 mL/min/{1.73_m2} (ref >59)
GFR calc non Af Amer: 75 mL/min/{1.73_m2} (ref >59)
Glucose: 85 mg/dL (ref 65–99)
Potassium: 4 mmol/L (ref 3.5–5.2)
Sodium: 139 mmol/L (ref 134–144)

## 2018-10-06 LAB — CBC WITH DIFFERENTIAL/PLATELET
Basophils Absolute: 0 10*3/uL (ref 0.0–0.2)
Basos: 0 %
EOS (ABSOLUTE): 0.1 10*3/uL (ref 0.0–0.4)
Eos: 2 %
Hematocrit: 32.9 % — ABNORMAL LOW (ref 34.0–46.6)
Hemoglobin: 10.8 g/dL — ABNORMAL LOW (ref 11.1–15.9)
IMMATURE GRANULOCYTES: 0 %
Immature Grans (Abs): 0 10*3/uL (ref 0.0–0.1)
Lymphocytes Absolute: 1.2 10*3/uL (ref 0.7–3.1)
Lymphs: 26 %
MCH: 26.3 pg — ABNORMAL LOW (ref 26.6–33.0)
MCHC: 32.8 g/dL (ref 31.5–35.7)
MCV: 80 fL (ref 79–97)
Monocytes Absolute: 0.4 10*3/uL (ref 0.1–0.9)
Monocytes: 8 %
NEUTROS PCT: 64 %
Neutrophils Absolute: 2.8 10*3/uL (ref 1.4–7.0)
PLATELETS: 336 10*3/uL (ref 150–450)
RBC: 4.11 x10E6/uL (ref 3.77–5.28)
RDW: 12.9 % (ref 11.7–15.4)
WBC: 4.5 10*3/uL (ref 3.4–10.8)

## 2018-10-06 LAB — TSH: TSH: 0.043 u[IU]/mL — ABNORMAL LOW (ref 0.450–4.500)

## 2018-10-06 LAB — MAGNESIUM: Magnesium: 2.2 mg/dL (ref 1.6–2.3)

## 2018-10-06 MED ORDER — LEVOTHYROXINE SODIUM 112 MCG PO TABS: 112.0000 ug | ORAL_TABLET | Freq: Every day | ORAL | 3 refills | Status: DC

## 2018-10-06 NOTE — Addendum Note (Signed)
Addended by: [name] on: 10/06/2018 09:22 AM   Modules accepted: Orders

## 2018-10-06 NOTE — Telephone Encounter (Addendum)
Patient called this a.m. to discuss lab results.  Patient's TSH very low at 0.043.  Therefore this is likely the cause of her palpitations.  Patient was advised of this.  Discussed this case with Dr. Mingo Amber.  Advised patient to not take Synthroid on 3/28 or 3/29.  Advised her that on 3/30 she should resume taking only 1 tab of Synthroid 112 mcg.  Patient voiced understanding of this.  Patient also requested that a letter be sent to her therefore she can speak with her psychiatrist about this, because she states she believes that this is the cause of a "really bad episode" that she had a few weeks ago.   Plan to recheck TSH in 6 weeks.

## 2018-10-06 NOTE — Progress Notes (Signed)
Letter sent with results per patient request.

## 2018-10-18 ENCOUNTER — Encounter: Payer: Self-pay | Admitting: Family Medicine

## 2018-10-18 ENCOUNTER — Telehealth: Payer: Self-pay

## 2018-10-18 NOTE — Telephone Encounter (Signed)
-----   Message from Ellsworth Bing, DO sent at 10/18/2018 10:57 AM EDT ----- Regarding: Letter for work Needs letter for work d/t asthma and not receiving proper mask. Letter made. Unsure if pt is coming by to pick up at clinic. Please advise.  Harriet [place], [place], PGY-3

## 2018-10-18 NOTE — Telephone Encounter (Signed)
Pt called- will come by and pick up her letter. Placed up front for her.

## 2018-11-14 ENCOUNTER — Other Ambulatory Visit: Payer: Self-pay

## 2018-11-14 ENCOUNTER — Ambulatory Visit (INDEPENDENT_AMBULATORY_CARE_PROVIDER_SITE_OTHER): Payer: Medicare Other | Admitting: Family Medicine

## 2018-11-14 ENCOUNTER — Encounter: Payer: Self-pay | Admitting: Family Medicine

## 2018-11-14 VITALS — BP 114/60 | HR 75

## 2018-11-14 DIAGNOSIS — J45909 Unspecified asthma, uncomplicated: Secondary | ICD-10-CM

## 2018-11-14 DIAGNOSIS — J4541 Moderate persistent asthma with (acute) exacerbation: Secondary | ICD-10-CM | POA: Diagnosis not present

## 2018-11-14 DIAGNOSIS — E039 Hypothyroidism, unspecified: Secondary | ICD-10-CM

## 2018-11-14 DIAGNOSIS — L601 Onycholysis: Secondary | ICD-10-CM | POA: Diagnosis not present

## 2018-11-14 MED ORDER — ALBUTEROL SULFATE HFA 108 (90 BASE) MCG/ACT IN AERS: 2.0000 | INHALATION_SPRAY | Freq: Four times a day (QID) | RESPIRATORY_TRACT | 1 refills | Status: DC | PRN

## 2018-11-14 MED ORDER — ALBUTEROL SULFATE (2.5 MG/3ML) 0.083% IN NEBU: 2.5000 mg | INHALATION_SOLUTION | Freq: Four times a day (QID) | RESPIRATORY_TRACT | 1 refills | Status: DC | PRN

## 2018-11-14 MED ORDER — FLUTICASONE PROPIONATE 50 MCG/ACT NA SUSP: 2.0000 | Freq: Every day | NASAL | 6 refills | Status: DC

## 2018-11-14 NOTE — Assessment & Plan Note (Signed)
No signs of nail infection.  Recommended good nail care including keeping short, avoiding wet work, avoidance of chemicals.  Patient voiced good understanding

## 2018-11-14 NOTE — Patient Instructions (Signed)
We are checking some labs today. If results require attention, either myself or my nurse will get in touch with you. If everything is normal, you will get a letter in the mail or a message in My Chart. Please give [place] a call if you do not hear from [place] after 2 weeks.    Refills of your asthma medications given.   Onycholysis The management of onycholysis involves the identification and treatment of the underlying condition. General nail care measures for onycholysis include ? Keeping nails trimmed short ? Avoiding trauma  ? Avoiding contact irritants  ? Keeping nails dry (avoiding wet work) ? Avoiding all nail cosmetics  ? Protecting hands from cold or windy weather

## 2018-11-14 NOTE — Assessment & Plan Note (Signed)
Patient levothyroxine was decreased to 112 mcg daily on March 27.  Recheck TSH today.  Will adjust dose as necessary.

## 2018-11-14 NOTE — Assessment & Plan Note (Signed)
Does not appear to be in acute exacerbation today.  She has normal work of breathing with good O2 sat and no wheezing on exam.  Per chart review there is a letter written by Dr. Yisroel Ramming that fits her work requirements.  This letter was printed and given to patient today.  Her Qvar and albuterol were refilled.

## 2018-11-14 NOTE — Progress Notes (Signed)
Subjective:  Colleen Peck is a 38 y.o. female who presents to the [facility] today with a chief complaint of asthma.   HPI:  Patient states that her asthma has been intermittently flared up over the last 3 weeks.  This is in the setting of usual good control.  She normally works as a Merchandiser, retail at the [employer] but due to short staffing she has been having to [employer] work.  Whenever she is exposed to the cleaning chemicals her asthma flares up.  She is normally on Qvar she only takes as needed and that usually keeps her out of an asthma flare.  She also uses albuterol nebulizers as needed which she has needed pretty much daily in the setting of chemical exposure. She is not short of breath or wheezing today.  No fevers, chills, cough.  She has noticed over the last several weeks that her nails have been splitting at the end.  She they are not discolored.  She has no lines.  She has not had any unexplained weight changes.   She would like to follow-up on her thyroid.  About 6 weeks ago her thyroid medication was reduced due to some lab work.  She has been taking levothyroxine 112 mcg daily.  She has been compliant and tolerating well.    ROS: Per HPI  Social Hx: She reports that she has never smoked. She has never used smokeless tobacco. She reports current drug use. Drug: Marijuana. She reports that she does not drink alcohol.   CC, SH/smoking status, and VS noted  Objective:  Physical Exam: BP 114/60   Pulse 75   LMP 10/15/2018   SpO2 99%   Gen: NAD, resting comfortably CV: RRR with no murmurs appreciated Pulm: NWOB, CTAB with no crackles, wheezes, or rhonchi MSK: no edema, cyanosis, or clubbing noted Skin: warm, dry.  Distal nails with shearing and exposure of underlying nail matrix.  No discoloration or thickening. Neuro: grossly normal, moves all extremities Psych: Normal affect and thought content   Assessment/Plan:  Asthma Does not appear to be in acute  exacerbation today.  She has normal work of breathing with good O2 sat and no wheezing on exam.  Per chart review there is a letter written by Dr. Yisroel Ramming that fits her work requirements.  This letter was printed and given to patient today.  Her Qvar and albuterol were refilled.  Onycholysis No signs of nail infection.  Recommended good nail care including keeping short, avoiding wet work, avoidance of chemicals.  Patient voiced good understanding  Hypothyroidism Patient levothyroxine was decreased to 112 mcg daily on March 27.  Recheck TSH today.  Will adjust dose as necessary.    Orders Placed This Encounter  Procedures  . TSH    Meds ordered this encounter  Medications  . fluticasone (FLONASE) 50 MCG/ACT nasal spray    Sig: Place 2 sprays into both nostrils daily.    Dispense:  16 g    Refill:  6  . albuterol (VENTOLIN HFA) 108 (90 Base) MCG/ACT inhaler    Sig: Inhale 2 puffs into the lungs every 6 (six) hours as needed for wheezing or shortness of breath.    Dispense:  18 g    Refill:  1  . albuterol (PROVENTIL) (2.5 MG/3ML) 0.083% nebulizer solution    Sig: Take 3 mLs (2.5 mg total) by nebulization every 6 (six) hours as needed for wheezing or shortness of breath.    Dispense:  150  mL    Refill:  [place], DO PGY-3, [facility] 11/14/2018 2:57 PM

## 2018-11-15 ENCOUNTER — Encounter: Payer: Self-pay | Admitting: Family Medicine

## 2018-11-15 LAB — TSH: TSH: 4.18 u[IU]/mL (ref 0.450–4.500)

## 2019-01-07 ENCOUNTER — Ambulatory Visit (HOSPITAL_COMMUNITY)
Admission: EM | Admit: 2019-01-07 | Discharge: 2019-01-07 | Disposition: A | Discharge status: Home / Self Care | Payer: 59 | Attending: Physician Assistant | Admitting: Physician Assistant

## 2019-01-07 ENCOUNTER — Other Ambulatory Visit: Payer: Self-pay

## 2019-01-07 ENCOUNTER — Encounter (HOSPITAL_COMMUNITY): Payer: Self-pay | Admitting: Emergency Medicine

## 2019-01-07 DIAGNOSIS — M5441 Lumbago with sciatica, right side: Secondary | ICD-10-CM

## 2019-01-07 MED ORDER — KETOROLAC TROMETHAMINE 30 MG/ML IJ SOLN
30.0000 mg | Freq: Once | INTRAMUSCULAR | Status: AC
  Administered 2019-01-07: 30 mg via INTRAMUSCULAR

## 2019-01-07 MED ORDER — KETOROLAC TROMETHAMINE 30 MG/ML IJ SOLN
INTRAMUSCULAR | Status: AC
  Filled 2019-01-07: qty 1

## 2019-01-07 MED ORDER — METHOCARBAMOL 500 MG PO TABS: 500.0000 mg | ORAL_TABLET | Freq: Two times a day (BID) | ORAL | 0 refills | Status: DC

## 2019-01-07 MED ORDER — PREDNISONE 50 MG PO TABS: 50.0000 mg | ORAL_TABLET | Freq: Every day | ORAL | 0 refills | Status: DC

## 2019-01-07 NOTE — ED Triage Notes (Signed)
Pt has hx of pinch nerve and pt has been having problems for 2  weeks again. Pt has been taking ibuprofen for about 2 weeks no relief.

## 2019-01-07 NOTE — Discharge Instructions (Signed)
Toradol injection in office today. Start prednisone as directed. Robaxin as needed, this can make you drowsy, so do not take if you are going to drive, operate heavy machinery, or make important decisions. Ice/heat compresses as needed. This can take up to 3-4 weeks to completely resolve, but you should be feeling better each week. Follow up here or with PCP if symptoms worsen, changes for reevaluation. If experience numbness/tingling of the inner thighs, loss of bladder or bowel control, go to the emergency department for evaluation.

## 2019-01-07 NOTE — ED Provider Notes (Signed)
[facility]    CSN: [id number] Arrival date & time: 01/07/19  1443     History   Chief Complaint Chief Complaint  Patient presents with  . Back Pain    HPI Colleen Peck is a 38 y.o. female.   38 year old female comes in for 2-week history of back pain.  Denies injury/trauma.  States pain to the right lower back, and now radiating down the leg with intermittent numbness and tingling.  Denies saddle anesthesia, loss of bladder or bowel control.  Denies urinary symptoms such as frequency, dysuria, hematuria.  Does do lifting at work, works at the post office, but states she has been squatting instead of bending back.  She has been taking ibuprofen consistently for the past 2 weeks without relief.  History of sciatica.     Past Medical History:  Diagnosis Date  . Abnormal pap    pt reports abnl pap many years ago.  Nl since then.  . Allergy    seasonal  . Anemia   . Asthma   . Bipolar disorder ([place])   . Depression   . Palpitations 03/12/2008  . PTSD (post-traumatic stress disorder)   . Seasonal allergies   . Thyroid disease 2009   Graves disease (pt reported resolved); hypothyriodism    Patient Active Problem List   Diagnosis Date Noted  . Onycholysis 11/14/2018  . Palpable lymph node 10/05/2018  . Cyclical mastalgia 36/14/4315  . PMS (premenstrual syndrome) 08/20/2018  . Vertigo 05/31/2018  . Dermatofibroma of right lower leg 01/06/2017  . History of borderline personality disorder 01/01/2016  . Carotidynia 12/16/2015  . Schizoaffective disorder, bipolar type ([place]) 11/26/2015  . Anxiety   . PTSD (post-traumatic stress disorder) 09/30/2015  . Paranoia ([place])   . Hypothyroidism 02/26/2015  . Anemia 01/30/2015  . Vitamin D deficiency 05/22/2013  . Obesity 09/08/2006  . Allergic rhinitis 09/08/2006  . Asthma 09/08/2006  . ECZEMA, ATOPIC DERMATITIS 09/08/2006    Past Surgical History:  Procedure Laterality Date  . DILATION AND CURETTAGE OF UTERUS   March 2006    OB History   No obstetric history on file.      Home Medications    Prior to Admission medications   Medication Sig Start Date End Date Taking? Authorizing Provider  albuterol (PROVENTIL) (2.5 MG/3ML) 0.083% nebulizer solution Take 3 mLs (2.5 mg total) by nebulization every 6 (six) hours as needed for wheezing or shortness of breath. 11/14/18   Bufford Lope, DO  albuterol (VENTOLIN HFA) 108 (90 Base) MCG/ACT inhaler Inhale 2 puffs into the lungs every 6 (six) hours as needed for wheezing or shortness of breath. 11/14/18   Bufford Lope, DO  beclomethasone (QVAR) 80 MCG/ACT inhaler Inhale 2 puffs into the lungs 2 (two) times daily.    [provider]  benzonatate (TESSALON) 100 MG capsule Take 1 capsule (100 mg total) by mouth 2 (two) times daily as needed for cough. Patient not taking: Reported on 09/12/2018 07/18/18   Alveda Reasons, MD  Cholecalciferol 800 UNIT (20 MCG) TABS Take 800 Units by mouth daily. 03/22/18   Meccariello, Bernita Raisin, DO  fluticasone (FLONASE) 50 MCG/ACT nasal spray Place 2 sprays into both nostrils daily. 11/14/18   Bufford Lope, DO  fluticasone (FLOVENT HFA) 110 MCG/ACT inhaler Inhale 1 puff into the lungs 2 (two) times daily. 12/16/17   Nicolette Bang, DO  hydrOXYzine (ATARAX/VISTARIL) 25 MG tablet Take 1 tablet (25 mg total) by mouth every 6 (six) hours  as needed for anxiety or itching. 12/15/17   Donnamae Jude, MD  lamoTRIgine (LAMICTAL) 200 MG tablet Take 1 tablet (200 mg total) by mouth daily. Take 1 tablet (25 mg) in the morning & 2 tablets (50 mg) in the evening: For mood stabilization 11/24/17   Nicolette Bang, DO  levothyroxine (SYNTHROID, LEVOTHROID) 112 MCG tablet Take 1 tablet (112 mcg total) by mouth daily. 10/06/18   Meccariello, Bernita Raisin, DO  methocarbamol (ROBAXIN) 500 MG tablet Take 1 tablet (500 mg total) by mouth 2 (two) times daily. 01/07/19   Tasia Catchings, Amy V, PA-C  predniSONE (DELTASONE) 50 MG tablet Take 1 tablet (50 mg  total) by mouth daily. 01/07/19   Ok Edwards, PA-C    Family History Family History  Problem Relation Age of Onset  . Drug abuse Father   . Depression Maternal Grandmother   . Anxiety disorder Maternal Grandmother   . COPD Maternal Grandmother   . Suicidality Cousin   . Depression Cousin   . Bipolar disorder Cousin   . Hypertension Mother   . Depression Mother   . Diabetes Paternal Grandfather   . COPD Paternal Grandmother   . Depression Maternal Aunt   . Breast cancer Maternal Aunt   . Depression Maternal Aunt   . Heart disease Neg Hx     Social History Social History   Tobacco Use  . Smoking status: Never Smoker  . Smokeless tobacco: Never Used  Substance Use Topics  . Alcohol use: No  . Drug use: Yes    Types: Marijuana    Comment: past use of marijuana in '08-'09. occasional eats brownies w/ marijuana  before thanksgiving     Allergies   Kiwi extract and Pineapple   Review of Systems Review of Systems  Reason unable to perform ROS: See HPI as above.     Physical Exam Triage Vital Signs ED Triage Vitals  Enc Vitals Group     BP 01/07/19 1555 128/85     Pulse Rate 01/07/19 1555 60     Resp 01/07/19 1555 16     Temp 01/07/19 1555 98.7 F (37.1 C)     Temp Source 01/07/19 1555 Oral     SpO2 01/07/19 1555 98 %     Weight --      Height --      Head Circumference --      Peak Flow --      Pain Score 01/07/19 1556 9     Pain Loc --      Pain Edu? --      Excl. in [place]? --    No data found.  Updated Vital Signs BP 128/85 (BP Location: Right Arm)   Pulse 60   Temp 98.7 F (37.1 C) (Oral)   Resp 16   SpO2 98%   Physical Exam Constitutional:      General: She is not in acute distress.    Appearance: She is well-developed. She is not diaphoretic.  HENT:     Head: Normocephalic and atraumatic.  Eyes:     Conjunctiva/sclera: Conjunctivae normal.     Pupils: Pupils are equal, round, and reactive to light.  Cardiovascular:     Rate and Rhythm:  Normal rate and regular rhythm.     Heart sounds: Normal heart sounds. No murmur. No friction rub. No gallop.   Pulmonary:     Effort: Pulmonary effort is normal. No accessory muscle usage or respiratory distress.     Breath  sounds: Normal breath sounds. No stridor. No decreased breath sounds, wheezing, rhonchi or rales.  Musculoskeletal:     Comments: Patient points to the right lower lumbar region for pain.  Pain is not reproducible.  No tenderness to palpation of spinous processes, bilateral back. Full range of motion of back and hips. Strength normal and equal bilaterally. Sensation intact and equal bilaterally.  Positive right straight leg raise.  Skin:    General: Skin is warm and dry.  Neurological:     Mental Status: She is alert and oriented to person, place, and time.    UC Treatments / Results  Labs (all labs ordered are listed, but only abnormal results are displayed) Labs Reviewed - No data to display  EKG None  Radiology No results found.  Procedures Procedures (including critical care time)  Medications Ordered in UC Medications  ketorolac (TORADOL) 30 MG/ML injection 30 mg (has no administration in time range)    Initial Impression / Assessment and Plan / UC Course  I have reviewed the triage vital signs and the nursing notes.  Pertinent labs & imaging results that were available during my care of the patient were reviewed by me and considered in my medical decision making (see chart for details).    Patient states unable to take off work, and not allowed to have light duty.  Will provide Toradol injection in office given patient has worked later today.  Will provide prednisone and Robaxin as directed.  Return precautions given.  Patient expresses understanding and agrees to plan.  Final Clinical Impressions(s) / UC Diagnoses   Final diagnoses:  Acute right-sided low back pain with right-sided sciatica    ED Prescriptions    Medication Sig Dispense Auth.  Provider   predniSONE (DELTASONE) 50 MG tablet Take 1 tablet (50 mg total) by mouth daily. 5 tablet Yu, Amy V, PA-C   methocarbamol (ROBAXIN) 500 MG tablet Take 1 tablet (500 mg total) by mouth 2 (two) times daily. 20 tablet Tobin Chad, [place] 01/07/19 1615

## 2019-01-08 ENCOUNTER — Telehealth: Payer: Self-pay | Admitting: Family Medicine

## 2019-01-08 NOTE — Telephone Encounter (Signed)
Family medicine telephone note  Received emergency after-hours call from patient.  Patient was seen on 01/07/2019 4 right-sided lower back pain at urgent care.  At that appointment she stated that her pain been going on for about 2 weeks.  She had no alarm symptoms, but did endorse symptoms consistent with right lower extremity sciatica.  She was treated with Toradol injection, prednisone 50 mg daily, Robaxin 500 mg twice daily.  Patient states that these were able to get her through her night shift at work.  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  She did state that late in her shift she try to pick up a phone book and felt a pop on her right side.  Initially did not have any pain from the pop, went home and slept.  Upon waking up she felt she had developed tremendous pain.  Stated that her right side was hurting her and she felt significant right hip pain on top of the pain she already had.  Patient has been taking the Robaxin 500 mg as prescribed, tried ibuprofen 600 mg once.  Stated that she messed up and did not take the prednisone as prescribed, took 100 mg daily instead of 50 this morning.  Patient has no alarm symptoms such as urinary or fecal incontinence.  States that she is still having that burning pain down her right buttock, hip, lower extremity.  Reviewed treatment options for patient.  Explained that if patient was concerned enough and was enough pain I would recommend for you to the ED to be evaluated.  If she felt like she could wait recommended being seen in clinic in the morning of 01/09/2019.  Patient opted to be seen in clinic in the morning of 6/30.  Explained to patient that she is likely underdosed on Robaxin given her age.  Explained that she can take the 500 mg up to 4 times per day.  Recommend taking ibuprofen 800 mg up to 4 times per day, and alternating this with Tylenol if possible.  Also recommended using alternating heat and cold.  Discussed alarm symptoms such as fecal  urinary incontinence, excruciating pain, and increased swelling.  If she develops these symptoms she understands she needs to go to the ED.  Patient prefers female providers.  Scheduled her for same-day appointment at 9:50 AM with Dr. Gwendlyn Deutscher for further eval on 6/30. Patient's problem sounds consistent with re-aggravation/exacerbation of her existing musculoskeletal problem.  Guadalupe Dawn MD PGY-2 Family Medicine Resident

## 2019-01-09 ENCOUNTER — Encounter: Payer: Self-pay | Admitting: Family Medicine

## 2019-01-09 ENCOUNTER — Ambulatory Visit (INDEPENDENT_AMBULATORY_CARE_PROVIDER_SITE_OTHER): Payer: Medicare Other | Admitting: Family Medicine

## 2019-01-09 ENCOUNTER — Other Ambulatory Visit: Payer: Self-pay

## 2019-01-09 VITALS — BP 124/70 | HR 72

## 2019-01-09 DIAGNOSIS — M5441 Lumbago with sciatica, right side: Secondary | ICD-10-CM

## 2019-01-09 MED ORDER — TRAMADOL HCL 50 MG PO TABS: 50.0000 mg | ORAL_TABLET | Freq: Two times a day (BID) | ORAL | 0 refills | Status: AC | PRN

## 2019-01-09 MED ORDER — IBUPROFEN 600 MG PO TABS: 600.0000 mg | ORAL_TABLET | Freq: Three times a day (TID) | ORAL | 0 refills | Status: DC | PRN

## 2019-01-09 NOTE — Patient Instructions (Addendum)
It was nice seeing you today. I am sorry about your back pain. It sounds like you are having what we call Sciatica, I.e nerve pain from your back. Please use Ibuprofen 600 mg as needed for few days. Prolonged use can cause acid reflux disease. Use Tramadol only for breakthrough pain. I have included back exercise which might help. Please go to the ED should you develop worsening of your symptoms, numbness, weakness in your legs or difficulty with urinating or stooling, or of you are not able to control your bowel or bladder.  Back Exercises These exercises help to make your trunk and back strong. They also help to keep the lower back flexible. Doing these exercises can help to prevent back pain or lessen existing pain.  If you have back pain, try to do these exercises 2-3 times each day or as told by your doctor.  As you get better, do the exercises once each day. Repeat the exercises more often as told by your doctor.  To stop back pain from coming back, do the exercises once each day, or as told by your doctor. Exercises Single knee to chest Do these steps 3-5 times in a row for each leg: 1. Lie on your back on a firm bed or the floor with your legs stretched out. 2. Bring one knee to your chest. 3. Grab your knee or thigh with both hands and hold them it in place. 4. Pull on your knee until you feel a gentle stretch in your lower back or buttocks. 5. Keep doing the stretch for 10-30 seconds. 6. Slowly let go of your leg and straighten it. Pelvic tilt Do these steps 5-10 times in a row: 1. Lie on your back on a firm bed or the floor with your legs stretched out. 2. Bend your knees so they point up to the ceiling. Your feet should be flat on the floor. 3. Tighten your lower belly (abdomen) muscles to press your lower back against the floor. This will make your tailbone point up to the ceiling instead of pointing down to your feet or the floor. 4. Stay in this position for 5-10 seconds while  you gently tighten your muscles and breathe evenly. Cat-cow Do these steps until your lower back bends more easily: 1. Get on your hands and knees on a firm surface. Keep your hands under your shoulders, and keep your knees under your hips. You may put padding under your knees. 2. Let your head hang down toward your chest. Tighten (contract) the muscles in your belly. Point your tailbone toward the floor so your lower back becomes rounded like the back of a cat. 3. Stay in this position for 5 seconds. 4. Slowly lift your head. Let the muscles of your belly relax. Point your tailbone up toward the ceiling so your back forms a sagging arch like the back of a cow. 5. Stay in this position for 5 seconds.  Press-ups Do these steps 5-10 times in a row: 1. Lie on your belly (face-down) on the floor. 2. Place your hands near your head, about shoulder-width apart. 3. While you keep your back relaxed and keep your hips on the floor, slowly straighten your arms to raise the top half of your body and lift your shoulders. Do not use your back muscles. You may change where you place your hands in order to make yourself more comfortable. 4. Stay in this position for 5 seconds. 5. Slowly return to lying flat on  the floor.  Bridges Do these steps 10 times in a row: 1. Lie on your back on a firm surface. 2. Bend your knees so they point up to the ceiling. Your feet should be flat on the floor. Your arms should be flat at your sides, next to your body. 3. Tighten your butt muscles and lift your butt off the floor until your waist is almost as high as your knees. If you do not feel the muscles working in your butt and the back of your thighs, slide your feet 1-2 inches farther away from your butt. 4. Stay in this position for 3-5 seconds. 5. Slowly lower your butt to the floor, and let your butt muscles relax. If this exercise is too easy, try doing it with your arms crossed over your chest. Belly crunches Do  these steps 5-10 times in a row: 1. Lie on your back on a firm bed or the floor with your legs stretched out. 2. Bend your knees so they point up to the ceiling. Your feet should be flat on the floor. 3. Cross your arms over your chest. 4. Tip your chin a little bit toward your chest but do not bend your neck. 5. Tighten your belly muscles and slowly raise your chest just enough to lift your shoulder blades a tiny bit off of the floor. Avoid raising your body higher than that, because it can put too much stress on your low back. 6. Slowly lower your chest and your head to the floor. Back lifts Do these steps 5-10 times in a row: 1. Lie on your belly (face-down) with your arms at your sides, and rest your forehead on the floor. 2. Tighten the muscles in your legs and your butt. 3. Slowly lift your chest off of the floor while you keep your hips on the floor. Keep the back of your head in line with the curve in your back. Look at the floor while you do this. 4. Stay in this position for 3-5 seconds. 5. Slowly lower your chest and your face to the floor. Contact a doctor if:  Your back pain gets a lot worse when you do an exercise.  Your back pain does not get better 2 hours after you exercise. If you have any of these problems, stop doing the exercises. Do not do them again unless your doctor says it is okay. Get help right away if:  You have sudden, very bad back pain. If this happens, stop doing the exercises. Do not do them again unless your doctor says it is okay. This information is not intended to replace advice given to you by your health care provider. Make sure you discuss any questions you have with your health care provider. Document Released: 07/31/2010 Document Revised: 03/23/2018 Document Reviewed: 03/23/2018 Elsevier Patient Education  2020 [employer].

## 2019-01-09 NOTE — Progress Notes (Addendum)
Subjective:     Patient ID: Colleen Peck, female   DOB: 19-Dec-1980, 38 y.o.   MRN: [id number]  Back Pain This is a new problem. The current episode started 1 to 4 weeks ago (Started 2 weeks ago). The problem occurs constantly. The problem has been gradually worsening since onset. Pain location: Throbbing of her right lower back. The quality of the pain is described as burning. The pain radiates to the right thigh. The pain is at a severity of 10/10. The pain is severe. The symptoms are aggravated by bending and position. Associated symptoms include tingling. Pertinent negatives include no bladder incontinence, bowel incontinence, dysuria, fever, numbness, paresis or weakness. Risk factors: LMP: 01/01/19, uses condoms regularly. Treatments tried: Roboxane 500 mg QID and Prednisone 50 mg qd, Ibuprofen 800 mg QID. The treatment provided mild relief.  She lifted a box at work yesterday morning and she felt a pop in her back which made her pain worse. She will like to go to work tomorrow and Ibuprofen 800 mg with Robaxin is not helping at all.  Current Outpatient Medications on File Prior to Visit  Medication Sig Dispense Refill  . beclomethasone (QVAR) 80 MCG/ACT inhaler Inhale 2 puffs into the lungs 2 (two) times daily.    . fluticasone (FLONASE) 50 MCG/ACT nasal spray Place 2 sprays into both nostrils daily. 16 g 6  . lamoTRIgine (LAMICTAL) 200 MG tablet Take 1 tablet (200 mg total) by mouth daily. Take 1 tablet (25 mg) in the morning & 2 tablets (50 mg) in the evening: For mood stabilization 30 tablet 11  . levothyroxine (SYNTHROID, LEVOTHROID) 112 MCG tablet Take 1 tablet (112 mcg total) by mouth daily. 90 tablet 3  . methocarbamol (ROBAXIN) 500 MG tablet Take 1 tablet (500 mg total) by mouth 2 (two) times daily. (Patient taking differently: Take 500 mg by mouth every 6 (six) hours as needed. ) 20 tablet 0  . predniSONE (DELTASONE) 50 MG tablet Take 1 tablet (50 mg total) by mouth daily. 5 tablet 0   . albuterol (PROVENTIL) (2.5 MG/3ML) 0.083% nebulizer solution Take 3 mLs (2.5 mg total) by nebulization every 6 (six) hours as needed for wheezing or shortness of breath. (Patient not taking: Reported on 01/09/2019) 150 mL 1  . albuterol (VENTOLIN HFA) 108 (90 Base) MCG/ACT inhaler Inhale 2 puffs into the lungs every 6 (six) hours as needed for wheezing or shortness of breath. (Patient not taking: Reported on 01/09/2019) 18 g 1  . benzonatate (TESSALON) 100 MG capsule Take 1 capsule (100 mg total) by mouth 2 (two) times daily as needed for cough. (Patient not taking: Reported on 09/12/2018) 20 capsule 0  . Cholecalciferol 800 UNIT (20 MCG) TABS Take 800 Units by mouth daily. 30 tablet 3  . fluticasone (FLOVENT HFA) 110 MCG/ACT inhaler Inhale 1 puff into the lungs 2 (two) times daily. 1 Inhaler 12  . hydrOXYzine (ATARAX/VISTARIL) 25 MG tablet Take 1 tablet (25 mg total) by mouth every 6 (six) hours as needed for anxiety or itching. (Patient not taking: Reported on 01/09/2019) 60 tablet 2   Current Facility-Administered Medications on File Prior to Visit  Medication Dose Route Frequency Provider Last Rate Last Dose  . 0.9 %  sodium chloride infusion  500 mL Intravenous Once Milus Banister, MD       Past Medical History:  Diagnosis Date  . Abnormal pap    pt reports abnl pap many years ago.  Nl since then.  . Allergy  seasonal  . Anemia   . Asthma   . Bipolar disorder ([place])   . Depression   . Palpitations 03/12/2008  . PTSD (post-traumatic stress disorder)   . Seasonal allergies   . Thyroid disease 2009   Graves disease (pt reported resolved); hypothyriodism     Review of Systems  Constitutional: Negative for fever.  Respiratory: Negative.   Cardiovascular: Negative.   Gastrointestinal: Negative for bowel incontinence.  Genitourinary: Negative.  Negative for bladder incontinence and dysuria.  Musculoskeletal: Positive for back pain.  Neurological: Positive for tingling. Negative for  weakness and numbness.  All other systems reviewed and are negative.      Objective:   Physical Exam Vitals signs reviewed.  Constitutional:      Appearance: Normal appearance. She is not ill-appearing.  Cardiovascular:     Rate and Rhythm: Normal rate and regular rhythm.     Heart sounds: Normal heart sounds. No murmur.  Pulmonary:     Effort: Pulmonary effort is normal. No respiratory distress.     Breath sounds: Normal breath sounds. No wheezing or rhonchi.  Abdominal:     General: Bowel sounds are normal.  Musculoskeletal:       Back:  Neurological:     General: No focal deficit present.     Mental Status: She is alert.     Cranial Nerves: Cranial nerves are intact.     Sensory: Sensation is intact.     Motor: Motor function is intact. No weakness.     Coordination: Coordination is intact.     Gait: Gait is intact.     Deep Tendon Reflexes: Reflexes are normal and symmetric.        Assessment:     Acute back pain    Plan:     Likely sciatica s/p lumbar disc herniation. No lumbar spine imaging on file for her. Continue Robaxin as instructed QID prn, not to use when operating machinery. Reduce Ibuprofen to 600 mg QID prn pain and Tramadol for breakthrough pain. She is aware that tramadol is short term treatment. Home back exercise instruction was given to her. Work letter given to limit her weight lifting. I discussed red flag signs and indication for ED visit with her. She verbalized understanding and agreed with the plan. F/U in 2 week or sooner with her PCP.

## 2019-01-16 ENCOUNTER — Encounter: Payer: Self-pay | Admitting: Student in an Organized Health Care Education/Training Program

## 2019-01-16 ENCOUNTER — Other Ambulatory Visit (HOSPITAL_COMMUNITY)
Admission: RE | Admit: 2019-01-16 | Discharge: 2019-01-16 | Disposition: A | Discharge status: Home / Self Care | Payer: Medicare Other | Source: Ambulatory Visit | Attending: Family Medicine | Admitting: Family Medicine

## 2019-01-16 ENCOUNTER — Ambulatory Visit (INDEPENDENT_AMBULATORY_CARE_PROVIDER_SITE_OTHER): Payer: Medicare Other | Admitting: Student in an Organized Health Care Education/Training Program

## 2019-01-16 ENCOUNTER — Other Ambulatory Visit: Payer: Self-pay

## 2019-01-16 VITALS — BP 104/60 | HR 83

## 2019-01-16 DIAGNOSIS — Z114 Encounter for screening for human immunodeficiency virus [HIV]: Secondary | ICD-10-CM | POA: Diagnosis not present

## 2019-01-16 DIAGNOSIS — Z113 Encounter for screening for infections with a predominantly sexual mode of transmission: Secondary | ICD-10-CM | POA: Diagnosis present

## 2019-01-16 LAB — POCT WET PREP (WET MOUNT)
Clue Cells Wet Prep Whiff POC: NEGATIVE
Trichomonas Wet Prep HPF POC: ABSENT

## 2019-01-16 NOTE — Patient Instructions (Signed)
It was a pleasure seeing you today in our clinic. Here is the treatment plan we have discussed and agreed upon together:  We drew blood work at today's visit. I will call or send you a letter with these results. If you do not hear from me within the next week, please give our office a call.  Our clinic's number is [phone]. Please call with questions or concerns about what we discussed today.  Be well, Dr. Jameson Morrow   

## 2019-01-16 NOTE — Assessment & Plan Note (Addendum)
Asymptomatic. Normal exam. GC/Chla, wet prep for trich, HIV and RPR completed today. Patient will need to be notified of results when they become available.

## 2019-01-16 NOTE — Progress Notes (Signed)
   CC: STI screening  HPI: Colleen Peck is a 38 y.o. female with PMH significant for asthma, hypothyroid, PTSD, paranoia presents for STI screening.  Patient was last sexually active in February. She reports today that she recently learned her partner may have had multiple other partners. She would like STI screening. No vaginal discharge. No vaginal pain. No genital sores or ulcers. She does have back pain which is chronic. She has no dysuria or urinary frequency or urgency. No missed menstrual period or concern for pregnancy.  Patient seems to have notable anxiety surrounding the results of her STI tests.  ROS see HPI Smoking Status noted  Review of Symptoms:  See HPI for ROS.   CC, SH/smoking status, and VS noted.  Objective: BP 104/60   Pulse 83   LMP 01/01/2019 (Approximate)   SpO2 99%  GEN: NAD, alert, cooperative, and pleasant. RESPIRATORY: Comfortable work of breathing, speaks in full sentences CV: Regular rate noted, distal extremities well perfused and warm without edema GI: Soft, nondistended, nontender SKIN: warm and dry, no rashes or lesions NEURO: II-XII grossly intact MSK: Moves 4 extremities equally PSYCH: AAOx3, appropriate affect Female genitalia: normal external genitalia, vulva, vagina, cervix, uterus and adnexa, some white discharge noted, appears normal  Assessment and plan:  Routine screening for STI (sexually transmitted infection) Asymptomatic. Normal exam. GC/Chla, wet prep for trich, HIV and RPR completed today. Patient will need to be notified of results when they become available.   Orders Placed This Encounter  Procedures  . HIV antibody (with reflex)  . RPR  . POCT Wet Prep [facility])    No orders of the defined types were placed in this encounter.    Everrett Coombe, MD,MS,  PGY3 01/16/2019 5:11 PM

## 2019-01-17 LAB — HIV ANTIBODY (ROUTINE TESTING W REFLEX): HIV Screen 4th Generation wRfx: NONREACTIVE

## 2019-01-17 LAB — RPR: RPR Ser Ql: NONREACTIVE

## 2019-01-18 ENCOUNTER — Telehealth: Payer: Self-pay | Admitting: *Deleted

## 2019-01-18 LAB — CERVICOVAGINAL ANCILLARY ONLY
Chlamydia: NEGATIVE
Neisseria Gonorrhea: NEGATIVE

## 2019-01-18 NOTE — Telephone Encounter (Signed)
-----   Message from [name], DO sent at 01/18/2019  9:22 AM EDT ----- Please call patient and inform her of negative results.

## 2019-01-18 NOTE — Telephone Encounter (Signed)
Contacted pt and informed her of below. Zennie Ayars Zimmerman Rumple, CMA

## 2019-01-19 ENCOUNTER — Ambulatory Visit (INDEPENDENT_AMBULATORY_CARE_PROVIDER_SITE_OTHER): Payer: Medicare Other | Admitting: Family Medicine

## 2019-01-19 ENCOUNTER — Other Ambulatory Visit: Payer: Self-pay

## 2019-01-19 VITALS — BP 112/68 | HR 73

## 2019-01-19 DIAGNOSIS — M5410 Radiculopathy, site unspecified: Secondary | ICD-10-CM

## 2019-01-19 DIAGNOSIS — M5441 Lumbago with sciatica, right side: Secondary | ICD-10-CM

## 2019-01-19 MED ORDER — NAPROXEN 500 MG PO TABS: 500.0000 mg | ORAL_TABLET | Freq: Two times a day (BID) | ORAL | 1 refills | Status: DC

## 2019-01-19 MED ORDER — DULOXETINE HCL 20 MG PO CPEP: 20.0000 mg | ORAL_CAPSULE | Freq: Every day | ORAL | 1 refills | Status: DC

## 2019-01-19 NOTE — Patient Instructions (Signed)
It was nice meeting you today Ms. Leffler!  For your back pain, we are going to try naproxen twice daily as needed, taken with food, and Cymbalta 20 mg once daily.  You do not have to take these medications if you are worried about side effects.  I am also referring you to physical therapy and to [facility] for them to provide a steroid injection if you would like.  You should start to see some improvement within the next month.  If you develop numbness, weakness, or incontinence, please let us know.  If you have any questions or concerns, please feel free to call the clinic.   Be well,  Dr. Shan Levans

## 2019-01-19 NOTE — Progress Notes (Signed)
   Subjective:    Colleen Peck - 38 y.o. female MRN [id number]  Date of birth: 12-01-1980  CC:  Colleen Peck is here for acute low back pain.  HPI: Started 6/28 Felt some pain in the right side of her lower back which was exacerbated by picking up something heavy at work, when she heard a pop Has tried ibuprofen, tramadol, neither of which seem to be very helpful Robaxin seems to be somewhat helpful Pain radiates from the right side of her lower back to her right groin, which is different from where it was located earlier, which was down her posterior right thigh Denies numbness, focal weakness, constitutional symptoms, and incontinence Does not want to try gabapentin but is interested in physical therapy and steroid injections Health Maintenance:  Health Maintenance Due  Topic Date Due  . PAP SMEAR-Modifier  09/04/2017    -  reports that she has never smoked. She has never used smokeless tobacco. - Review of Systems: Per HPI. - Past Medical History: Patient Active Problem List   Diagnosis Date Noted  . Acute back pain with radiculopathy 01/22/2019  . Onycholysis 11/14/2018  . Palpable lymph node 10/05/2018  . Cyclical mastalgia 62/09/5595  . PMS (premenstrual syndrome) 08/20/2018  . Vertigo 05/31/2018  . Dermatofibroma of right lower leg 01/06/2017  . History of borderline personality disorder 01/01/2016  . Carotidynia 12/16/2015  . Schizoaffective disorder, bipolar type ([place]) 11/26/2015  . Anxiety   . PTSD (post-traumatic stress disorder) 09/30/2015  . Routine screening for STI (sexually transmitted infection)   . Paranoia ([place])   . Hypothyroidism 02/26/2015  . Anemia 01/30/2015  . Vitamin D deficiency 05/22/2013  . Obesity 09/08/2006  . Allergic rhinitis 09/08/2006  . Asthma 09/08/2006  . ECZEMA, ATOPIC DERMATITIS 09/08/2006   - Medications: reviewed and updated   Objective:   Physical Exam BP 112/68   Pulse 73   LMP 01/01/2019 (Approximate)   SpO2 98%   Gen: NAD, alert, cooperative with exam, well-appearing CV: RRR, good S1/S2, no murmur, no edema Resp: CTABL, no wheezes, non-labored Musculoskeletal:    Back - Normal skin, Spine with normal alignment and no deformity.  No tenderness to vertebral process palpation.  Paraspinous muscles slightly tender on the right lumbosacral area.   Range of motion is full at neck and lumbar sacral regions. Normal gait. Skin: no rashes, normal turgor  Neuro: no gross deficits.  Psych: good insight, alert and oriented        Assessment & Plan:   Acute back pain with radiculopathy Patient likely has some nerve root compression causing her radiculopathy.  This may be due to a ruptured disc.  Patient was reassured that her symptoms will likely improve over the next 6 to 8 weeks.  In order to treat her significant level of pain, we will try naproxen 500 mg twice daily as needed with food, Cymbalta 20 mg daily, referral to physical therapy, and referral to orthopedic surgery for steroid injections.  Patient was advised to stop the tramadol and ibuprofen since these medications were not working for her.    Maia Breslow, M.D. 01/22/2019, 7:48 AM PGY-3, [place]

## 2019-01-22 DIAGNOSIS — M5410 Radiculopathy, site unspecified: Secondary | ICD-10-CM | POA: Insufficient documentation

## 2019-01-22 NOTE — Assessment & Plan Note (Addendum)
Patient likely has some nerve root compression causing her radiculopathy.  This may be due to a ruptured disc.  Patient was reassured that her symptoms will likely improve over the next 6 to 8 weeks.  In order to treat her significant level of pain, we will try naproxen 500 mg twice daily as needed with food, Cymbalta 20 mg daily, referral to physical therapy, and referral to orthopedic surgery for steroid injections.  Patient was advised to stop the tramadol and ibuprofen since these medications were not working for her.  She was given return precautions including worsening of pain, development of fever, and development of neurological signs.

## 2019-01-26 ENCOUNTER — Ambulatory Visit: Payer: Medicare Other | Admitting: Family Medicine

## 2019-01-31 ENCOUNTER — Ambulatory Visit: Payer: Medicare Other | Attending: Family Medicine | Admitting: Physical Therapy

## 2019-01-31 ENCOUNTER — Encounter: Payer: Self-pay | Admitting: Physical Therapy

## 2019-01-31 ENCOUNTER — Other Ambulatory Visit: Payer: Self-pay

## 2019-01-31 DIAGNOSIS — M5441 Lumbago with sciatica, right side: Secondary | ICD-10-CM | POA: Diagnosis not present

## 2019-01-31 NOTE — Therapy (Signed)
[place], [place], [address] Phone: [phone]   Fax:  [phone]  Physical Therapy Evaluation  Patient Details  Name: Colleen Peck MRN: [id number] Date of Birth: [birth year] Referring Provider (PT): Kathrene Alu, MD   Encounter Date: 01/31/2019  PT End of Session - 01/31/19 2029    Visit Number  1    Number of Visits  8    Date for PT Re-Evaluation  03/14/19    Authorization Type  MCR UHC progress note at 28 KX at 15    PT Start Time  1600    PT Stop Time  1650    PT Time Calculation (min)  50 min    Activity Tolerance  Patient tolerated treatment well    Behavior During Therapy  [facility] for tasks assessed/performed       Past Medical History:  Diagnosis Date  . Abnormal pap    pt reports abnl pap many years ago.  Nl since then.  . Allergy    seasonal  . Anemia   . Asthma   . Bipolar disorder ([place])   . Depression   . Palpitations 03/12/2008  . PTSD (post-traumatic stress disorder)   . Seasonal allergies   . Thyroid disease 2009   Graves disease (pt reported resolved); hypothyriodism    Past Surgical History:  Procedure Laterality Date  . DILATION AND CURETTAGE OF UTERUS  March 2006    There were no vitals filed for this visit.   Subjective Assessment - 01/31/19 1609    Subjective  Low back pain with Rt sciatica. Started in June 2020 when she slept on the couch. Then in 01/07/19 when picking up something heavy at work when she heard a pop. She works for post [employer] and has to do mix of sitting, standing, pushing, and pulling. 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 Pain is worse at night or sleeping.    Pertinent History  PMH: anx, PTSD, bipolar, obesity    Limitations  Sitting;Lifting;House hold activities;Standing;Walking    How long can you sit comfortably?  30 min    How long can you  stand comfortably?  30-40 min    Diagnostic tests  no imaging found in chart, says she had some XR at [facility] and that she will have MRI monday.    Patient Stated Goals  decrease pain and work with less pain    Currently in Pain?  Yes    Pain Score  7     Pain Location  Back   and Rt leg   Pain Orientation  Lower;Right    Pain Descriptors / Indicators  Aching;Numbness    Pain Type  Acute pain    Pain Radiating Towards  Rt groin    Pain Onset  1 to 4 weeks ago    Pain Frequency  Constant    Multiple Pain Sites  No         [facility] PT Assessment - 01/31/19 0001      Assessment   Medical Diagnosis  Low back pain with Rt sciatica    Referring Provider (PT)  Kathrene Alu, MD    Onset Date/Surgical Date  --   June 2020 onset of pain   Next MD Visit  nothing scheduled but does relay she is seeing MD at [facility]    Prior Therapy  none  Precautions   Precautions  None      Restrictions   Weight Bearing Restrictions  No      Balance Screen   Has the patient fallen in the past 6 months  Yes    How many times?  1   1 time at work, fell over box.    Has the patient had a decrease in activity level because of a fear of falling?   No    Is the patient reluctant to leave their home because of a fear of falling?   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      Cognition   Overall Cognitive Status  Within [place] for tasks assessed      Observation/Other Assessments   Focus on Therapeutic Outcomes (FOTO)   will do 2nd visit      Sensation   Light Touch  Appears Intact      Coordination   Gross Motor Movements are Fluid and Coordinated  Yes      Posture/Postural Control   Posture Comments  some anterior pelvic tilt, no LLD observed      ROM /  Strength   AROM / PROM / Strength  AROM;Strength      AROM   AROM Assessment Site  Lumbar    Lumbar Flexion  WNL    Lumbar Extension  50%    Lumbar - Right Side Bend  50%    Lumbar - Left Side Bend  WNL    Lumbar - Right Rotation  WNL    Lumbar - Left Rotation  WNL      Strength   Overall Strength Comments  4+/5 hip strength on Rt flex and abd in sitting compared to 5/5 on LT hip, knee strength and ankle DF 5/5 MMT bilat in sitting      Flexibility   Soft Tissue Assessment /Muscle Length  --   tight hamstrings     Palpation   Spinal mobility  good and no pain with PAM    Palpation comment  TTP at Rt SI jt and glutes, piriformis, no TTP reported in P.S.      Special Tests   Other special tests  neg SLR, neg slump test, neg fabers test, neg scours test      Transfers   Transfers  Independent with all Transfers      Ambulation/Gait   Gait Comments  WFL, no antalgic gait                Objective measurements completed on examination: See above findings.      [facility] Adult PT Treatment/Exercise - 01/31/19 0001      Modalities   Modalities  Electrical Stimulation;Moist Heat      Moist Heat Therapy   Number Minutes Moist Heat  15 Minutes    Moist Heat Location  Lumbar Spine      Electrical Stimulation   Electrical Stimulation Location  lumbar and Rt glutes, with heat    Electrical Stimulation Action  IFC    Electrical Stimulation Parameters  tolerance    Electrical Stimulation Goals  Pain      Manual Therapy   Manual therapy comments  some relief with LAD  PT Education - 01/31/19 2028    Education Details  HEp, TENS, poc    Person(s) Educated  Patient    Methods  Explanation;Demonstration;Verbal cues;Handout    Comprehension  Verbalized understanding;Need further instruction          PT Long Term Goals - 01/31/19 2038      PT LONG TERM GOAL #1   Title  Pt will be I and compliant with HEP. (Target goal for all goals 6 weeks  03/14/19)      PT LONG TERM GOAL #2   Title  Pt will improve ROM to [facility] to improve mobility      PT LONG TERM GOAL #3   Title  Pt will improve Rt hip strength to at least 5-/5 MMT to improve function      PT LONG TERM GOAL #4   Title  Pt will reduce pain by overall 50%    Baseline  7             Plan - 01/31/19 2032    Clinical Impression Statement  Pt presents with signs and symptoms consistent of acute low back pain with Rt radiculopathy. Pain appears to be more stenosis or facet arthopathy in nature as disc pathology special testing was negative and she had direction preference to flexon vs extension. She may also have piriformis syndrome. She will have MRI next week so PT can further diagnose after getting results. She has overall decreased lumbar ROM, decreased Rt hip strength, decreased activity tolerance particularly with standing or walking, sleeping, and increased pain limiting her functional abilities. She will benefit from skilled PT to address her deficits.    Personal Factors and Comorbidities  Comorbidity 1;Comorbidity 2;Fitness    Comorbidities  PMH: anx, PTSD, bipolar, obesity    Examination-Activity Limitations  Sleep;Squat;Stairs;Lift;Stand;Carry    Examination-Participation Restrictions  Meal Prep;Cleaning;Community Activity;Driving;Laundry;Shop    Stability/Clinical Decision Making  Evolving/Moderate complexity    Clinical Decision Making  Moderate    Rehab Potential  Good    PT Frequency  Other (comment)   1-2   PT Treatment/Interventions  Cryotherapy;Electrical Stimulation;Iontophoresis 4mg /ml Dexamethasone;Moist Heat;Traction;Ultrasound;Stair training;Therapeutic activities;Therapeutic exercise;Neuromuscular re-education;Manual techniques;Passive range of motion;Dry needling;Taping;Spinal Manipulations;Joint Manipulations    PT Next Visit Plan  what was MRI results? how was HEP    PT Home Exercise Plan  Access Code: [place] child pose, sidebend to Lt, lumbar  rotation stretch in sidelying, piriformis stretch, bridge       Patient will benefit from skilled therapeutic intervention in order to improve the following deficits and impairments:  Decreased activity tolerance, Decreased endurance, Decreased range of motion, Decreased strength, Difficulty walking, Increased muscle spasms, Postural dysfunction, Pain, Obesity  Visit Diagnosis: 1. Acute bilateral low back pain with right-sided sciatica        Problem List Patient Active Problem List   Diagnosis Date Noted  . Acute back pain with radiculopathy 01/22/2019  . Onycholysis 11/14/2018  . Palpable lymph node 10/05/2018  . Cyclical mastalgia 42/70/6237  . PMS (premenstrual syndrome) 08/20/2018  . Vertigo 05/31/2018  . Dermatofibroma of right lower leg 01/06/2017  . History of borderline personality disorder 01/01/2016  . Carotidynia 12/16/2015  . Schizoaffective disorder, bipolar type ([place]) 11/26/2015  . Anxiety   . PTSD (post-traumatic stress disorder) 09/30/2015  . Routine screening for STI (sexually transmitted infection)   . Paranoia ([place])   . Hypothyroidism 02/26/2015  . Anemia 01/30/2015  . Vitamin D deficiency 05/22/2013  . Obesity 09/08/2006  . Allergic rhinitis 09/08/2006  .  Asthma 09/08/2006  . ECZEMA, ATOPIC DERMATITIS 09/08/2006    Debbe Odea, PT,DPT 01/31/2019, 8:40 PM  [facility] [address] [place], [place], [address] Phone: [phone]   Fax:  [phone]  Name: Colleen Peck MRN: [id number] Date of Birth: 01/16/1981

## 2019-01-31 NOTE — Patient Instructions (Addendum)
Access Code: [email or web address]  URL: [email or web address]  Date: 01/31/2019  Prepared by: Elsie Ra   Exercises  Childs Pose - 3 sets - 30 hold - 2x daily - 6x weekly  Supine Piriformis Stretch - 3 sets - 30 hold - 2x daily - 6x weekly  Sidelying Lumbar Rotation Stretch - 10 reps - 3 sets - 2x daily - 6x weekly  Supine Bridge - 10 reps - 1-2 sets - 5 hold - 2x daily - 6x weekly  Standing Sidebends - 10 reps - 1 sets - 2x daily - 6x weekly  TENS UNIT: This is helpful for muscle pain and spasm.   Search and Purchase a TENS 7000 2nd edition at www.tenspros.com. It should be less than $30.     TENS unit instructions: Do not shower or bathe with the unit on Turn the unit off before removing electrodes or batteries If the electrodes lose stickiness add a drop of water to the electrodes after they are disconnected from the unit and place on plastic sheet. If you continued to have difficulty, call the TENS unit company to purchase more electrodes. Do not apply lotion on the skin area prior to use. Make sure the skin is clean and dry as this will help prolong the life of the electrodes. After use, always check skin for unusual red areas, rash or other skin difficulties. If there are any skin problems, does not apply electrodes to the same area. Never remove the electrodes from the unit by pulling the wires. Do not use the TENS unit or electrodes other than as directed. Do not change electrode placement without consultating your therapist or physician. Keep 2 fingers with between each electrode. Wear time ratio is 2:1, on to off times.    For example on for 30 minutes off for 15 minutes and then on for 30 minutes off for 15 minutes

## 2019-02-12 ENCOUNTER — Encounter: Payer: Self-pay | Admitting: Family Medicine

## 2019-02-26 ENCOUNTER — Encounter: Payer: Medicare Other | Admitting: Physical Therapy

## 2019-02-26 ENCOUNTER — Ambulatory Visit: Payer: Medicare Other | Attending: Family Medicine | Admitting: Physical Therapy

## 2019-02-26 ENCOUNTER — Telehealth: Payer: Self-pay | Admitting: Physical Therapy

## 2019-02-26 DIAGNOSIS — M5441 Lumbago with sciatica, right side: Secondary | ICD-10-CM | POA: Insufficient documentation

## 2019-02-26 NOTE — Telephone Encounter (Signed)
Spoke with patient- reports she called our office after appointment time, forgot about the appointment and rescheduled for tomorrow. Marcos Peloso C. Dorisann Schwanke PT, DPT 02/26/19 5:04 PM

## 2019-02-27 ENCOUNTER — Ambulatory Visit: Payer: Medicare Other | Admitting: Physical Therapy

## 2019-02-27 ENCOUNTER — Other Ambulatory Visit: Payer: Self-pay

## 2019-02-27 DIAGNOSIS — M5441 Lumbago with sciatica, right side: Secondary | ICD-10-CM

## 2019-02-27 NOTE — Therapy (Addendum)
[place], [place], [address] Phone: [phone]   Fax:  [phone]  Physical Therapy Treatment/Discharge addendum PHYSICAL THERAPY DISCHARGE SUMMARY  Visits from Start of Care: 2  Current functional level related to goals / functional outcomes: See below   Remaining deficits: See below   Education / Equipment: HEP Plan: Patient agrees to discharge.  Patient goals were not met. Patient is being discharged due to not returning since the last visit.  ?????   Elsie Ra, PT, DPT 05/22/19 8:46 AM      Patient Details  Name: Colleen Peck MRN: [id number] Date of Birth: 1980/12/16 Referring Provider (PT): Kathrene Alu, MD   Encounter Date: 02/27/2019  PT End of Session - 02/27/19 1620    Visit Number  2    Number of Visits  8    Date for PT Re-Evaluation  03/14/19    Authorization Type  MCR UHC progress note at 10 KX at 15    PT Start Time  0337    PT Stop Time  0425    PT Time Calculation (min)  48 min    Activity Tolerance  Patient tolerated treatment well    Behavior During Therapy  [facility] for tasks assessed/performed       Past Medical History:  Diagnosis Date  . Abnormal pap    pt reports abnl pap many years ago.  Nl since then.  . Allergy    seasonal  . Anemia   . Asthma   . Bipolar disorder ([place])   . Depression   . Palpitations 03/12/2008  . PTSD (post-traumatic stress disorder)   . Seasonal allergies   . Thyroid disease 2009   Graves disease (pt reported resolved); hypothyriodism    Past Surgical History:  Procedure Laterality Date  . DILATION AND CURETTAGE OF UTERUS  March 2006    There were no vitals filed for this visit.  Subjective Assessment - 02/27/19 1614    Subjective  Relays she had MRI at [facility] and saw someone at [place], she says she has 2 herniated discs and may need surgery. She is having some Lt leg radiculopathy today and ovreall pain is  6/10 in her back/Lt leg                       [facility] Adult PT Treatment/Exercise - 02/27/19 0001      Exercises   Exercises  Lumbar      Lumbar Exercises: Stretches   Piriformis Stretch  Right;Left;2 reps;30 seconds    Other Lumbar Stretch Exercise  prone lying 2 min, progressed to prone on elbows 2 min, progressed to small range prone press ups X 10 and this did appear to decreased Lt leg radicuopathy      Lumbar Exercises: Supine   Bridge  15 reps      Modalities   Modalities  Traction      Traction   Type of Traction  Lumbar    Min (lbs)  65    Max (lbs)  80    Time  15 min pre set program             PT Education - 02/27/19 1620    Education Details  reviesed HEP after MRI, rationale for exension and traction, watch out for centralizaton and peripheralization    Person(s) Educated  Patient    Methods  Explanation    Comprehension  Verbalized understanding  PT Long Term Goals - 01/31/19 2038      PT LONG TERM GOAL #1   Title  Pt will be I and compliant with HEP. (Target goal for all goals 6 weeks 03/14/19)      PT LONG TERM GOAL #2   Title  Pt will improve ROM to [facility] to improve mobility      PT LONG TERM GOAL #3   Title  Pt will improve Rt hip strength to at least 5-/5 MMT to improve function      PT LONG TERM GOAL #4   Title  Pt will reduce pain by overall 50%    Baseline  7            Plan - 02/27/19 1621    Clinical Impression Statement  She had MRI and pt reports shows 2 herniated discs and she may need surgery but wants to try conservative PT first. HEP was revised for more extension based program and she did appear to have some centralizaton with mckinzie prone program. Trailed mechanical traction today and will assess her response next session    Personal Factors and Comorbidities  Comorbidity 1;Comorbidity 2;Fitness    Comorbidities  PMH: anx, PTSD, bipolar, obesity    Examination-Activity Limitations   Sleep;Squat;Stairs;Lift;Stand;Carry    Examination-Participation Restrictions  Meal Prep;Cleaning;Community Activity;Driving;Laundry;Shop    Stability/Clinical Decision Making  Evolving/Moderate complexity    Rehab Potential  Good    PT Frequency  Other (comment)   1-2   PT Treatment/Interventions  Cryotherapy;Electrical Stimulation;Iontophoresis 78m/ml Dexamethasone;Moist Heat;Traction;Ultrasound;Stair training;Therapeutic activities;Therapeutic exercise;Neuromuscular re-education;Manual techniques;Passive range of motion;Dry needling;Taping;Spinal Manipulations;Joint Manipulations    PT Next Visit Plan  how was updated HEP and traction    PT Home Exercise Plan  revised to prone on elbows, standing lumbar ext, piriformis stretch, bridge, self lumbar traction in hooklying    Consulted and Agree with Plan of Care  Patient       Patient will benefit from skilled therapeutic intervention in order to improve the following deficits and impairments:  Decreased activity tolerance, Decreased endurance, Decreased range of motion, Decreased strength, Difficulty walking, Increased muscle spasms, Postural dysfunction, Pain, Obesity  Visit Diagnosis: 1. Acute bilateral low back pain with right-sided sciatica        Problem List Patient Active Problem List   Diagnosis Date Noted  . Acute back pain with radiculopathy 01/22/2019  . Onycholysis 11/14/2018  . Palpable lymph node 10/05/2018  . Cyclical mastalgia 091/91/6606 . PMS (premenstrual syndrome) 08/20/2018  . Vertigo 05/31/2018  . Dermatofibroma of right lower leg 01/06/2017  . History of borderline personality disorder 01/01/2016  . Carotidynia 12/16/2015  . Schizoaffective disorder, bipolar type ([place] 11/26/2015  . Anxiety   . PTSD (post-traumatic stress disorder) 09/30/2015  . Routine screening for STI (sexually transmitted infection)   . Paranoia ([place]   . Hypothyroidism 02/26/2015  . Anemia 01/30/2015  . Vitamin D deficiency  05/22/2013  . Obesity 09/08/2006  . Allergic rhinitis 09/08/2006  . Asthma 09/08/2006  . ECZEMA, ATOPIC DERMATITIS 09/08/2006    BSilvestre Mesi8/18/2020, 4:24 PM  [facility] [address]Phone: [phone]  Fax:  [phone] Name: Colleen IacovelliMRN: [id number] of Birth: 1July 08, 1982

## 2019-02-27 NOTE — Patient Instructions (Signed)
Access Code: [email or web address]  URL: [email or web address]  Date: 02/27/2019  Prepared by: Elsie Ra   Exercises  Supine Piriformis Stretch - 3 reps - 1 sets - 30 hold - 2x daily - 6x weekly  Supine Hamstring Stretch with Strap - 3 reps - 1 sets - 30 hold - 2x daily - 6x weekly  Standing Lumbar Extension - 10 reps - 1-2 sets - 5 hold - 2x daily - 6x weekly  Supine Bridge - 10 reps - 1-2 sets - 5 hold - 2x daily - 6x weekly  Prone on Elbows Stretch - 3 reps - 3 sets - 60 sec hold - 2x daily - 6x weekly  Hooklying Lumbar Traction - 10 reps - 3 sets - 2x daily - 6x weekly

## 2019-03-06 ENCOUNTER — Ambulatory Visit (INDEPENDENT_AMBULATORY_CARE_PROVIDER_SITE_OTHER): Payer: Medicare Other | Admitting: Family Medicine

## 2019-03-06 ENCOUNTER — Other Ambulatory Visit: Payer: Self-pay

## 2019-03-06 DIAGNOSIS — M5410 Radiculopathy, site unspecified: Secondary | ICD-10-CM | POA: Diagnosis present

## 2019-03-06 MED ORDER — BACLOFEN 10 MG PO TABS: 10.0000 mg | ORAL_TABLET | Freq: Three times a day (TID) | ORAL | 0 refills | Status: DC

## 2019-03-06 MED ORDER — NAPROXEN 500 MG PO TABS: 500.0000 mg | ORAL_TABLET | Freq: Two times a day (BID) | ORAL | 0 refills | Status: DC

## 2019-03-06 NOTE — Patient Instructions (Signed)
It was great meeting you today!  Sorry been having back pain and your sciatica has been flaring up.  We came to the mutual conclusion to do muscle relaxer and I will can refill your naproxen.  He can also take Tylenol and apply heat and ice and alternating fashion.  I would keep your appointment with your orthopedic surgeon that can act as a natural follow-up for this visit.
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
## 2019-05-03 ENCOUNTER — Encounter (HOSPITAL_COMMUNITY): Payer: Self-pay

## 2019-05-03 ENCOUNTER — Other Ambulatory Visit: Payer: Self-pay

## 2019-05-03 ENCOUNTER — Ambulatory Visit (HOSPITAL_COMMUNITY)
Admission: EM | Admit: 2019-05-03 | Discharge: 2019-05-03 | Disposition: A | Discharge status: Home / Self Care | Payer: Medicare Other | Attending: Internal Medicine | Admitting: Internal Medicine

## 2019-05-03 DIAGNOSIS — A059 Bacterial foodborne intoxication, unspecified: Secondary | ICD-10-CM

## 2019-05-03 MED ORDER — ONDANSETRON 4 MG PO TBDP: 4.0000 mg | ORAL_TABLET | Freq: Three times a day (TID) | ORAL | 0 refills | Status: DC | PRN

## 2019-05-03 NOTE — ED Provider Notes (Signed)
[facility]    CSN: [id number] Arrival date & time: 05/03/19  1058      History   Chief Complaint Chief Complaint  Patient presents with  . Emesis    HPI Colleen Peck is a 38 y.o. female with a history of asthma, bipolar disorder comes to urgent care on account of nausea and an episode of nonbloody vomiting this morning.  Patient prepared patient will be with some spinach suspected to be this morning.  Right after drinking this morning she had an episode of vomiting.  Vomitus was nonbloody.  She denied any abdominal pain.  No diarrhea.  No fever or chills. HPI  Past Medical History:  Diagnosis Date  . Abnormal pap    pt reports abnl pap many years ago.  Nl since then.  . Allergy    seasonal  . Anemia   . Asthma   . Bipolar disorder ([place])   . Depression   . Palpitations 03/12/2008  . PTSD (post-traumatic stress disorder)   . Seasonal allergies   . Thyroid disease 2009   Graves disease (pt reported resolved); hypothyriodism    Patient Active Problem List   Diagnosis Date Noted  . Acute back pain with radiculopathy 01/22/2019  . Onycholysis 11/14/2018  . Palpable lymph node 10/05/2018  . Cyclical mastalgia [id number]  . PMS (premenstrual syndrome) 08/20/2018  . Vertigo 05/31/2018  . Dermatofibroma of right lower leg 01/06/2017  . History of borderline personality disorder 01/01/2016  . Carotidynia 12/16/2015  . Schizoaffective disorder, bipolar type ([place]) 11/26/2015  . Anxiety   . PTSD (post-traumatic stress disorder) 09/30/2015  . Routine screening for STI (sexually transmitted infection)   . Paranoia ([place])   . Hypothyroidism 02/26/2015  . Anemia 01/30/2015  . Vitamin D deficiency 05/22/2013  . Obesity 09/08/2006  . Allergic rhinitis 09/08/2006  . Asthma 09/08/2006  . ECZEMA, ATOPIC DERMATITIS 09/08/2006    Past Surgical History:  Procedure Laterality Date  . DILATION AND CURETTAGE OF UTERUS  March 2006    OB History   No obstetric  history on file.      Home Medications    Prior to Admission medications   Medication Sig Start Date End Date Taking? Authorizing Provider  albuterol (PROVENTIL) (2.5 MG/3ML) 0.083% nebulizer solution Take 3 mLs (2.5 mg total) by nebulization every 6 (six) hours as needed for wheezing or shortness of breath. Patient not taking: Reported on 01/09/2019 11/14/18   Bufford Lope, DO  albuterol (VENTOLIN HFA) 108 (90 Base) MCG/ACT inhaler Inhale 2 puffs into the lungs every 6 (six) hours as needed for wheezing or shortness of breath. Patient not taking: Reported on 01/09/2019 11/14/18   Bufford Lope, DO  baclofen (LIORESAL) 10 MG tablet Take 1 tablet (10 mg total) by mouth 3 (three) times daily. 03/06/19   Guadalupe Dawn, MD  beclomethasone (QVAR) 80 MCG/ACT inhaler Inhale 2 puffs into the lungs 2 (two) times daily.    [provider]  benzonatate (TESSALON) 100 MG capsule Take 1 capsule (100 mg total) by mouth 2 (two) times daily as needed for cough. Patient not taking: Reported on 09/12/2018 07/18/18   Alveda Reasons, MD  Cholecalciferol 800 UNIT (20 MCG) TABS Take 800 Units by mouth daily. 03/22/18   Meccariello, Bernita Raisin, DO  DULoxetine (CYMBALTA) 20 MG capsule Take 1 capsule (20 mg total) by mouth daily. 01/19/19   Kathrene Alu, MD  fluticasone (FLONASE) 50 MCG/ACT nasal spray Place 2 sprays into both nostrils daily.  11/14/18   Bufford Lope, DO  fluticasone (FLOVENT HFA) 110 MCG/ACT inhaler Inhale 1 puff into the lungs 2 (two) times daily. 12/16/17   Nicolette Bang, DO  hydrOXYzine (ATARAX/VISTARIL) 25 MG tablet Take 1 tablet (25 mg total) by mouth every 6 (six) hours as needed for anxiety or itching. Patient not taking: Reported on 01/09/2019 12/15/17   Donnamae Jude, MD  ibuprofen (ADVIL) 600 MG tablet Take 1 tablet (600 mg total) by mouth every 8 (eight) hours as needed. 01/09/19   Kinnie Feil, MD  lamoTRIgine (LAMICTAL) 200 MG tablet Take 1 tablet (200 mg total) by mouth  daily. Take 1 tablet (25 mg) in the morning & 2 tablets (50 mg) in the evening: For mood stabilization 11/24/17   Nicolette Bang, DO  levothyroxine (SYNTHROID, LEVOTHROID) 112 MCG tablet Take 1 tablet (112 mcg total) by mouth daily. 10/06/18   Meccariello, Bernita Raisin, DO  methocarbamol (ROBAXIN) 500 MG tablet Take 1 tablet (500 mg total) by mouth 2 (two) times daily. Patient taking differently: Take 500 mg by mouth every 6 (six) hours as needed.  01/07/19   Tasia Catchings, Amy V, PA-C  naproxen (NAPROSYN) 500 MG tablet Take 1 tablet (500 mg total) by mouth 2 (two) times daily with a meal. 03/06/19   Guadalupe Dawn, MD  predniSONE (DELTASONE) 50 MG tablet Take 1 tablet (50 mg total) by mouth daily. 01/07/19   Ok Edwards, PA-C    Family History Family History  Problem Relation Age of Onset  . Drug abuse Father   . Depression Maternal Grandmother   . Anxiety disorder Maternal Grandmother   . COPD Maternal Grandmother   . Suicidality Cousin   . Depression Cousin   . Bipolar disorder Cousin   . Hypertension Mother   . Depression Mother   . Diabetes Paternal Grandfather   . COPD Paternal Grandmother   . Depression Maternal Aunt   . Breast cancer Maternal Aunt   . Depression Maternal Aunt   . Heart disease Neg Hx     Social History Social History   Tobacco Use  . Smoking status: Never Smoker  . Smokeless tobacco: Never Used  Substance Use Topics  . Alcohol use: No  . Drug use: Yes    Types: Marijuana    Comment: past use of marijuana in '08-'09. occasional eats brownies w/ marijuana  before thanksgiving     Allergies   Kiwi extract and Pineapple   Review of Systems Review of Systems  Constitutional: Negative.   HENT: Negative.   Gastrointestinal: Positive for nausea and vomiting. Negative for abdominal distention, abdominal pain, anal bleeding, blood in stool, constipation, diarrhea and rectal pain.  Endocrine: Negative.   Genitourinary: Negative.   Musculoskeletal: Negative.    Skin: Negative.   Neurological: Negative for dizziness, weakness, light-headedness and headaches.  Psychiatric/Behavioral: Negative.      Physical Exam Triage Vital Signs ED Triage Vitals  Enc Vitals Group     BP 05/03/19 1116 (!) 109/56     Pulse Rate 05/03/19 1116 64     Resp 05/03/19 1116 17     Temp 05/03/19 1116 97.7 F (36.5 C)     Temp Source 05/03/19 1116 Tympanic     SpO2 05/03/19 1116 100 %     Weight 05/03/19 1119 240 lb (108.9 kg)     Height --      Head Circumference --      Peak Flow --  Pain Score 05/03/19 1118 1     Pain Loc --      Pain Edu? --      Excl. in [place]? --    No data found.  Updated Vital Signs BP (!) 109/56 (BP Location: Right Arm)   Pulse 64   Temp 97.7 F (36.5 C) (Tympanic)   Resp 17   Wt 108.9 kg   LMP 05/03/2019   SpO2 100%   BMI 37.59 kg/m   Visual Acuity Right Eye Distance:   Left Eye Distance:   Bilateral Distance:    Right Eye Near:   Left Eye Near:    Bilateral Near:     Physical Exam Vitals signs and nursing note reviewed.  Constitutional:      Appearance: She is not ill-appearing or diaphoretic.  Cardiovascular:     Rate and Rhythm: Normal rate and regular rhythm.     Pulses: Normal pulses.     Heart sounds: Normal heart sounds. No murmur. No friction rub.  Pulmonary:     Effort: Pulmonary effort is normal. No respiratory distress.     Breath sounds: Normal breath sounds. No rhonchi or rales.  Abdominal:     General: Bowel sounds are normal.     Palpations: Abdomen is soft.  Musculoskeletal: Normal range of motion.  Skin:    General: Skin is warm.     Capillary Refill: Capillary refill takes less than 2 seconds.  Neurological:     General: No focal deficit present.     Mental Status: She is alert and oriented to person, place, and time.      UC Treatments / Results  Labs (all labs ordered are listed, but only abnormal results are displayed) Labs Reviewed - No data to display  EKG   Radiology  No results found.  Procedures Procedures (including critical care time)  Medications Ordered in UC Medications - No data to display  Initial Impression / Assessment and Plan / UC Course  I have reviewed the triage vital signs and the nursing notes.  Pertinent labs & imaging results that were available during my care of the patient were reviewed by me and considered in my medical decision making (see chart for details).     1.  Food poisoning: Patient is encouraged to ush oral fluid intake Zofran as needed for nausea/vomiting If patient develops persistent vomiting she can return to the urgent care to be reevaluated.  Final Clinical Impressions(s) / UC Diagnoses   Final diagnoses:  None   Discharge Instructions   None    ED Prescriptions    None     PDMP not reviewed this encounter.   Chase Picket, MD 05/03/19 1218

## 2019-05-03 NOTE — ED Triage Notes (Signed)
Pt states she made a bad smoothie and it made her sick at work and she vomited. Pt she need a note say she's ok to return back to work tomorrow.

## 2019-06-21 ENCOUNTER — Other Ambulatory Visit: Payer: Self-pay

## 2019-06-21 ENCOUNTER — Encounter: Payer: Self-pay | Admitting: Family Medicine

## 2019-06-21 ENCOUNTER — Ambulatory Visit (INDEPENDENT_AMBULATORY_CARE_PROVIDER_SITE_OTHER): Payer: Medicare Other | Admitting: Family Medicine

## 2019-06-21 DIAGNOSIS — N644 Mastodynia: Secondary | ICD-10-CM

## 2019-06-21 NOTE — Assessment & Plan Note (Signed)
This seems to be a problem patient has dealt with in the past per chart review.  Reassured the patient that it is normal to have breast changes that occur with her menstrual cycle.  Offered further investigation with an ultrasound if the patient felt strongly about further imaging, but she declined and said that she would prefer to continue observation.  Encouraged patient to let us know if there are lumps of the breast that grow larger or do not resolve episodically.

## 2019-06-21 NOTE — Progress Notes (Signed)
   Subjective:    Colleen Peck - 38 y.o. female MRN [id number]  Date of birth: 1981-01-20  CC:  Zophia Dress is here for a tender lump in the left breast.  HPI: Left breast lump Located at the 6:00 area along the border of the areola Tends to swell and be tender right before her period and this occurs almost every month, will resolve between these episodes Had a maternal aunt with breast cancer but no other family history Will sometimes get bilateral, white nipple discharge but no other discharge present No skin changes noted  Health Maintenance:  Health Maintenance Due  Topic Date Due  . PAP SMEAR-Modifier  09/04/2017    -  reports that she has never smoked. She has never used smokeless tobacco. - Review of Systems: Per HPI. - Past Medical History: Patient Active Problem List   Diagnosis Date Noted  . Acute back pain with radiculopathy 01/22/2019  . Onycholysis 11/14/2018  . Palpable lymph node 10/05/2018  . Cyclical mastalgia [id number]  . PMS (premenstrual syndrome) 08/20/2018  . Vertigo 05/31/2018  . Dermatofibroma of right lower leg 01/06/2017  . History of borderline personality disorder 01/01/2016  . Carotidynia 12/16/2015  . Schizoaffective disorder, bipolar type ([place]) 11/26/2015  . Anxiety   . PTSD (post-traumatic stress disorder) 09/30/2015  . Routine screening for STI (sexually transmitted infection)   . Paranoia ([place])   . Hypothyroidism 02/26/2015  . Anemia 01/30/2015  . Vitamin D deficiency 05/22/2013  . Obesity 09/08/2006  . Allergic rhinitis 09/08/2006  . Asthma 09/08/2006  . ECZEMA, ATOPIC DERMATITIS 09/08/2006   - Medications: reviewed and updated   Objective:   Physical Exam BP 104/70   Pulse 86   Ht 5\' 7"  (1.702 m)   Wt 231 lb 4 oz (104.9 kg)   LMP 06/21/2019   SpO2 99%   BMI 36.22 kg/m  Gen: NAD, alert, cooperative with exam, well-appearing Breast: No skin abnormalities visualized.  Right and left breasts symmetrical, palpation  negative for concerning nodules, although some normal irregularity noted.  Mildly tender to palpation of the 6:00 region of the left breast just under the areola.  No axillary lymphadenopathy bilaterally.       Assessment & Plan:   Cyclical mastalgia This seems to be a problem patient has dealt with in the past per chart review.  Reassured the patient that it is normal to have breast changes that occur with her menstrual cycle.  Offered further investigation with an ultrasound if the patient felt strongly about further imaging, but she declined and said that she would prefer to continue observation.  Encouraged patient to let us know if there are lumps of the breast that grow larger or do not resolve episodically.    Maia Breslow, M.D. 06/21/2019, 1:58 PM PGY-3, [place]

## 2019-06-21 NOTE — Patient Instructions (Addendum)
It was nice meeting you today Ms. Snethen!  Your breast changes appear to be the normal changes that come with your monthly period, and your breast exam was normal today.  We will continue to observe this area.  Please let us know if it does not go away or if it grows bigger or hurts more.  We can always perform an ultrasound if needed.   If you have any questions or concerns, please feel free to call the clinic.   Be well,  Dr. Shan Levans

## 2019-07-15 ENCOUNTER — Telehealth: Payer: Medicare Other | Admitting: Physician Assistant

## 2019-07-15 DIAGNOSIS — M549 Dorsalgia, unspecified: Secondary | ICD-10-CM

## 2019-07-15 NOTE — Progress Notes (Signed)
Based on what you shared with me, I feel your condition warrants further evaluation and I recommend that you be seen for a face to face office visit. Giving the severity of pain along with areas of numbness/changed sensation and radiation into the extremities, you need a detailed examination to determine if imaging is warranted and so the most appropriate treatment can be given. I am sorry you are dealing with this, especially on your birthday! Please do not delay care!   NOTE: If you entered your credit card information for this eVisit, you will not be charged. You may see a "hold" on your card for the $35 but that hold will drop off and you will not have a charge processed.   If you are having a true medical emergency please call 911.      For an urgent face to face visit, [place] has five urgent care centers for your convenience:      NEW:  [facility] [place] at [place] Get Driving Directions [id number] [address] [place], [place] [address] . 10 am - 6pm Monday - Friday    [place] [facility]) Get Driving Directions [id number] [address] [place], [place] [address] . 10 am to 8 pm Monday-Friday . 12 pm to 8 pm [facility] Get Driving Directions [id number] [place], [place], [place] [address] . 8 am to 8 pm Monday-Friday . 9 am to 6 pm Saturday . 11 am to 6 pm Sunday     [facility] at [facility] Get Driving Directions  [id number] [address] [place], [place] [address] . 8 am to 8 pm Monday-Friday . 8 am to 4 pm [facility] at [place] Get Driving Directions [id number] [address] [place], [place] [address] . 12 pm to 6 pm Monday-Friday      Your e-visit answers were reviewed by a board certified advanced clinical practitioner to complete your  personal care plan.  Thank you for using e-Visits.

## 2019-07-16 ENCOUNTER — Telehealth (INDEPENDENT_AMBULATORY_CARE_PROVIDER_SITE_OTHER): Payer: Medicare Other | Admitting: Family Medicine

## 2019-07-16 ENCOUNTER — Other Ambulatory Visit: Payer: Self-pay

## 2019-07-16 ENCOUNTER — Encounter: Payer: Self-pay | Admitting: Family Medicine

## 2019-07-16 DIAGNOSIS — Z20822 Contact with and (suspected) exposure to covid-19: Secondary | ICD-10-CM | POA: Diagnosis not present

## 2019-07-16 DIAGNOSIS — M5432 Sciatica, left side: Secondary | ICD-10-CM | POA: Insufficient documentation

## 2019-07-16 MED ORDER — CYCLOBENZAPRINE HCL 5 MG PO TABS: 5.0000 mg | ORAL_TABLET | Freq: Three times a day (TID) | ORAL | 1 refills | Status: DC | PRN

## 2019-07-16 NOTE — Progress Notes (Signed)
[facility] Telemedicine Visit  Patient consented to have virtual visit. Method of visit: Video  Encounter participants: Patient: Colleen Peck - located at Home  Provider: Gifford Shave - located at [facility]  Chief Complaint: left leg nerve pain   HPI:   Nerve pain  Patient reports that she has history of nerve pain that is gotten worse over the past week or so.  Patient has had no recent injury to cause this pain.  Over the summer she found out she had 2 herniated disks and was treated with naproxen as well as Robaxin.  She had pain which she describes as a throbbing pain that goes down the back of her left side from her buttocks down her left lower extremity.  She says that it feels like it is being "squeezed".  It is a throbbing pain that is worse while sitting still or laying on it and gets better with movement.  She reports that last night she was seen by an urgent care because the pain was 10/10 but after taking some naproxen as well as Tylenol it is 6/10 this morning.  Patient says that after she was initially prescribed the medication over the summer it did better and she has not had need for it but she was recently in contact with a Covid positive person and has been sitting around at home since that time which is what she attributes it getting worse to.  COVID  Patient reports that her mother is positive for COVID-19.  She has had close contact with her.  On Friday she had it headache and postnasal drip.  On Saturday she had diarrhea.  Patient reports that she has not had any known fever, cough, shortness of breath.  She reports that she feels fine now and has no symptoms.  She is scheduled for a Covid test on 07/16/2018 at 8:30 in the morning.  She brings all this up because she needs a note for work.  She reports that she has been quarantining since having symptoms and finding out about her mother.  She called the clinic to schedule an appointment and they  recommended she just get tested.  There is no documentation of this.  ROS: per HPI  Pertinent PMHx: Acute back pain with radiculopathy  Exam: General: Respiratory: Speaking clearly, no shortness of breath noted  Assessment/Plan:  Sciatic nerve pain, left Patient reports pain from her left buttocks down the posterior aspect of her left leg for over 6 months.  She has been prescribed Robaxin for this pain and completed her course and was doing better.  The pain gets better with movement and is worse with inactivity.  It is a throbbing "squeezed" pain.  Patient has been less active over the last week because of the Covid contacts. -Prescribed Flexeril 5 mg every 6 hours as needed -Continue naproxen as needed -Continue Tylenol as needed -Monitor for worsening symptoms and seek medical attention if needed  Close exposure to COVID-19 virus Patient with recent contact who has tested positive for COVID-19. Patient's mother tested positive for COVID-19.  Patient reports she had headache and postnasal drip last Friday and then diarrhea on Saturday.  No fever, cough, shortness of breath.  She is scheduled for testing 07/16/2018. -Counseled on wearing a mask, washing hands and avoiding social gatherings  -ED precautions discussed and patient expressed good understanding -Patient instructed to avoid others until they meet criteria for ending isolation after any suspected COVID, which are:  -24  hours with no fever (without use of medicaitons) and -respiratory symptoms have improved (e.g. cough, shortness of breath) or  -10 days since symptoms first appeared

## 2019-07-16 NOTE — Assessment & Plan Note (Addendum)
Patient reports pain from her left buttocks down the posterior aspect of her left leg for over 6 months.  She has been prescribed Robaxin for this pain and completed her course and was doing better.  The pain gets better with movement and is worse with inactivity.  It is a throbbing "squeezed" pain.  Patient has been less active over the last week because of the Covid contacts. -Prescribed Flexeril 5 mg every 6 hours as needed -Continue naproxen as needed -Continue Tylenol as needed -Monitor for worsening symptoms and seek medical attention if needed

## 2019-07-16 NOTE — Assessment & Plan Note (Signed)
Patient with recent contact who has tested positive for COVID-19. Patient's mother tested positive for COVID-19.  Patient reports she had headache and postnasal drip last Friday and then diarrhea on Saturday.  No fever, cough, shortness of breath.  She is scheduled for testing 07/16/2018. -Counseled on wearing a mask, washing hands and avoiding social gatherings  -ED precautions discussed and patient expressed good understanding -Patient instructed to avoid others until they meet criteria for ending isolation after any suspected COVID, which are:  -24 hours with no fever (without use of medicaitons) and -respiratory symptoms have improved (e.g. cough, shortness of breath) or  -10 days since symptoms first appeared

## 2019-07-16 NOTE — Progress Notes (Signed)
Work note sent to patient for Covid testing.

## 2019-07-17 ENCOUNTER — Ambulatory Visit: Payer: 59 | Attending: Internal Medicine

## 2019-07-17 DIAGNOSIS — Z20822 Contact with and (suspected) exposure to covid-19: Secondary | ICD-10-CM

## 2019-07-18 ENCOUNTER — Encounter (INDEPENDENT_AMBULATORY_CARE_PROVIDER_SITE_OTHER): Payer: Self-pay

## 2019-07-19 LAB — NOVEL CORONAVIRUS, NAA: SARS-CoV-2, NAA: NOT DETECTED

## 2019-07-31 ENCOUNTER — Other Ambulatory Visit: Payer: Self-pay | Admitting: Family Medicine

## 2019-07-31 DIAGNOSIS — E039 Hypothyroidism, unspecified: Secondary | ICD-10-CM

## 2019-09-30 ENCOUNTER — Telehealth: Payer: Self-pay | Admitting: Family Medicine

## 2019-09-30 DIAGNOSIS — R221 Localized swelling, mass and lump, neck: Secondary | ICD-10-CM | POA: Insufficient documentation

## 2019-09-30 NOTE — Assessment & Plan Note (Signed)
Patient called the after-hours line with concerns for "lump on her neck".  Could consider goiter, lymphadenopathy, abscess, cyst, lipoma.  Less likely a congenital cyst as patient first noticed the lump today.  Denies any difficulty swallowing/breathing, no drainage or surrounding erythema. -Patient instructed to monitor for any changes, encouraged not to touch the cyst -Scheduled appointment for patient 10/04/2019 at 3:50 PM with Dr. Shela Commons -Strict return precautions given including difficulty swallowing, breathing, inability to tolerate p.o., etc.

## 2019-09-30 NOTE — Telephone Encounter (Signed)
Lump in neck in size of bird egg. Just found it today. Firm, mobile, "feels funny", she has thyroid disease, no difficulty swallowing or breathing, no drainage, no erythema, non-tender to touch.   [facility] After Hours Emergency Call  Patient consented to have virtual visit. Method of visit: Telephone  Encounter participants: Patient: Colleen Peck - located at [facility] [phone] Provider: Daisy Floro - located at [facility] Others (if applicable): None  Chief Complaint: "Lump on Neck"  HPI: Patient called the after-hours emergency line reporting that today she found a "lump on the side of her neck the size of a bird egg".  She says it is firm but "not hard as a rock".  It is mobile underneath her skin, "it feels funny".  She does have a history of thyroid disease, but reports "this is nowhere near my thyroid, it is off on the side".  She denies difficulty swallowing, breathing, drainage from the lump or any surrounding erythema.  Denies that it is tender to touch, but reports that it feels weird.  No report of sneezing, nasal congestion, runny nose, sore throat.  Denies nausea, vomiting, abdominal pain.  Patient was seen in March 2020 for a palpable lymph node of her left axillary region, was also felt under patient's right underarm.  No reports at the time of any cervical lymphadenopathy.  ROS: per HPI  Pertinent PMHx:  Hypothyroidism Palpable lymph node (March 2020)  Exam:  Respiratory: Comfortable work of breathing, speaking complete sentences, no cough  Assessment/Plan: Lump on neck Patient called the after-hours line with concerns for "lump on her neck".  Could consider goiter, lymphadenopathy, abscess, cyst, lipoma.  Less likely a congenital cyst as patient first noticed the lump today.  Denies any difficulty swallowing/breathing, no drainage or surrounding erythema. -Patient instructed to monitor for any changes, encouraged not to touch the  cyst -Scheduled appointment for patient 10/04/2019 at 3:50 PM with Dr. Shela Commons -Strict return precautions given including difficulty swallowing, breathing, inability to tolerate p.o., etc.    Time spent during visit with patient: 8 minutes  Milus Banister, [employer], PGY-2 09/30/2019 8:40 PM

## 2019-10-04 ENCOUNTER — Other Ambulatory Visit: Payer: Self-pay

## 2019-10-04 ENCOUNTER — Ambulatory Visit (INDEPENDENT_AMBULATORY_CARE_PROVIDER_SITE_OTHER): Payer: Medicare Other | Admitting: Family Medicine

## 2019-10-04 ENCOUNTER — Encounter: Payer: Self-pay | Admitting: Family Medicine

## 2019-10-04 VITALS — BP 108/60 | HR 94 | Ht 67.0 in | Wt 246.0 lb

## 2019-10-04 DIAGNOSIS — R221 Localized swelling, mass and lump, neck: Secondary | ICD-10-CM

## 2019-10-04 DIAGNOSIS — R599 Enlarged lymph nodes, unspecified: Secondary | ICD-10-CM

## 2019-10-04 NOTE — Patient Instructions (Signed)
It was great to see you!  Our plans for today:  - We are getting an ultrasound of your neck for more evaluation. - We are checking some labs today, we will release these results to your MyChart.  Take care and seek immediate care sooner if you develop any concerns.   Dr. [facility]

## 2019-10-04 NOTE — Progress Notes (Signed)
    SUBJECTIVE:   CHIEF COMPLAINT: Lump in neck  HPI:   Patient called after-hours line 3/21 after palpating a lump on the side of her neck the size of a bird egg, firm and mobile.  Here today for in person evaluation. States she noticed firm lump on the R side of her neck since Sunday. It does not cause her pain but she is unable to sleep comfortably on that side. She has been having headaches on the top part of her head and vivid dreams for the past week but thinks this is likely due to her psychiatric meds being adjusted last week.  She has been eating, drinking, and breathing normally.  She denies any recent infections or any personal history of cancer.  Denies weight loss, night sweats, fever.  PERTINENT  PMH / PSH: Patient rhinitis, asthma, hypothyroidism, eczema, obesity, vitamin D deficiency, anemia, paranoid schizophrenia, PTSD  OBJECTIVE:   BP 108/60   Pulse 94   Ht '5\' 7"'$  (1.702 m)   Wt 246 lb (111.6 kg)   LMP 09/20/2019   SpO2 99%   BMI 38.53 kg/m   GEN: Overweight, well-appearing, in NAD HEENT: MMM, no asymmetry Neck: 2x 1.5 cm firm and nonmobile mass palpated just posterior to base of SCM.  No other appreciable anterior or posterior cervical chain or supraclavicular lymphadenopathy.  Trachea midline.  Full AROM of neck.  ASSESSMENT/PLAN:   Lump on neck Unclear etiology.  May be enlarged lymph node given location along posterior cervical chain although with lack of other lymphadenopathy, increased size and nontender to palpation, warrants work-up.  Will obtain ultrasound and possible biopsy for further evaluation.  We will also obtain ESR, CRP, CBC with differential to assess for possible infection or inflammatory component.  No B symptoms.  Does not interfere with eating, drinking, breathing therefore does not require emergent work-up.  Will call with results and further follow-up.   Rory Percy, [place]

## 2019-10-05 LAB — CBC WITH DIFFERENTIAL
Basophils Absolute: 0 10*3/uL (ref 0.0–0.2)
Basos: 1 %
EOS (ABSOLUTE): 0.2 10*3/uL (ref 0.0–0.4)
Eos: 6 %
Hematocrit: 33.5 % — ABNORMAL LOW (ref 34.0–46.6)
Hemoglobin: 10.7 g/dL — ABNORMAL LOW (ref 11.1–15.9)
Immature Grans (Abs): 0 10*3/uL (ref 0.0–0.1)
Immature Granulocytes: 0 %
Lymphocytes Absolute: 1.6 10*3/uL (ref 0.7–3.1)
Lymphs: 38 %
MCH: 27 pg (ref 26.6–33.0)
MCHC: 31.9 g/dL (ref 31.5–35.7)
MCV: 84 fL (ref 79–97)
Monocytes Absolute: 0.3 10*3/uL (ref 0.1–0.9)
Monocytes: 7 %
Neutrophils Absolute: 2.1 10*3/uL (ref 1.4–7.0)
Neutrophils: 48 %
RBC: 3.97 x10E6/uL (ref 3.77–5.28)
RDW: 13.6 % (ref 11.7–15.4)
WBC: 4.2 10*3/uL (ref 3.4–10.8)

## 2019-10-05 LAB — C-REACTIVE PROTEIN: CRP: 2 mg/L (ref 0–10)

## 2019-10-05 LAB — SEDIMENTATION RATE: Sed Rate: 15 mm/hr (ref 0–32)

## 2019-10-05 NOTE — Assessment & Plan Note (Addendum)
Unclear etiology.  May be enlarged lymph node given location along posterior cervical chain although with lack of other lymphadenopathy, increased size and nontender to palpation, warrants work-up.  Will obtain ultrasound and possible biopsy for further evaluation.  We will also obtain ESR, CRP, CBC with differential to assess for possible infection or inflammatory component.  No B symptoms.  Does not interfere with eating, drinking, breathing therefore does not require emergent work-up.  Will call with results and further follow-up.

## 2019-10-11 ENCOUNTER — Ambulatory Visit
Admission: RE | Admit: 2019-10-11 | Discharge: 2019-10-11 | Disposition: A | Discharge status: Home / Self Care | Payer: 59 | Source: Ambulatory Visit | Attending: Family Medicine | Admitting: Family Medicine

## 2019-10-11 DIAGNOSIS — R221 Localized swelling, mass and lump, neck: Secondary | ICD-10-CM

## 2019-10-15 ENCOUNTER — Encounter: Payer: Self-pay | Admitting: Family Medicine

## 2019-10-15 ENCOUNTER — Ambulatory Visit (INDEPENDENT_AMBULATORY_CARE_PROVIDER_SITE_OTHER): Payer: 59 | Admitting: Family Medicine

## 2019-10-15 ENCOUNTER — Ambulatory Visit (HOSPITAL_COMMUNITY)
Admission: RE | Admit: 2019-10-15 | Discharge: 2019-10-15 | Disposition: A | Discharge status: Home / Self Care | Payer: Medicare Other | Source: Ambulatory Visit | Attending: Family Medicine | Admitting: Family Medicine

## 2019-10-15 ENCOUNTER — Other Ambulatory Visit: Payer: Self-pay

## 2019-10-15 ENCOUNTER — Telehealth: Payer: Self-pay | Admitting: Family Medicine

## 2019-10-15 VITALS — BP 112/62 | HR 74 | Ht 67.0 in | Wt 242.4 lb

## 2019-10-15 DIAGNOSIS — R0602 Shortness of breath: Secondary | ICD-10-CM

## 2019-10-15 DIAGNOSIS — R079 Chest pain, unspecified: Secondary | ICD-10-CM

## 2019-10-15 DIAGNOSIS — R221 Localized swelling, mass and lump, neck: Secondary | ICD-10-CM

## 2019-10-15 NOTE — Patient Instructions (Signed)
It was a pleasure to meet you today.  Regarding your chest pain, this seems to have resolved.  Your EKG that was performed today looks completely normal.  I would like for you to continue your GI medications as needed for possible acid.  Regarding her shortness of breath I would like for you to try to decrease the use of your inhaler if possible.  Only use it when you are noticing wheezes and shortness of breath.  Regarding the hyperpigmentation of your toes this is most likely related to positioning of your feet while you rest.  I would recommend making sure you sit flat-footed.  I noticed no issues with circulation.  Regarding your swollen lymph nodes in your neck I have ordered a CT of your neck with contrast to try and narrow down exactly what is going on.  I will let you know about those results when they come back.  If you have any further questions or concerns please feel free to call the clinic and we can schedule an appointment.

## 2019-10-15 NOTE — Assessment & Plan Note (Signed)
Patient presents after calling the after-hours line due to right-sided chest pain.  This chest pain was sudden onset, sharp, burning pain which resolved with antacid medications.  Patient also notes that onset occurred while she was searching for possible causes of her lymphadenopathy on Google and WebMD.  On exam patient denies any chest pain at this time.  EKG showed normal sinus rhythm. -Cardiac cause for this is low on my differential given entire clinical picture -Symptoms may be related to acid reflux given resolution after treatment -May also be related to anxiety regarding cervical lymphadenopathy -Strict return precautions given

## 2019-10-15 NOTE — Progress Notes (Signed)
SUBJECTIVE:   CHIEF COMPLAINT / HPI:  Patient presents complaining of of right sided chest pain along with shortness of breath.  Chest pain Patient called the after-hours line due to having a sharp burning right-sided chest pain along with mild shortness of breath.  She reports that she was researching possible causes of her lymphadenopathy in her neck and noticed this chest pain along with shortness of breath.  She does note that she took some GI medications to help with acid reflux which seem to help with the chest pain.  On evaluation today the chest pain is completely resolved.  Shortness of breath Regarding the shortness of breath patient reports that she has a long history of asthma and that she has been using her inhaler every 2 hours since the symptoms started.  She notes that she does not have shortness of breath at rest but does notice mild shortness of breath on exertion.  Denies any lightheadedness or dizziness.  Patient has history of anemia but does not take iron supplementation.  Most recent CBC showed hemoglobin of 10.7.  Cervical lymphadenopathy Patient was recently seen and evaluated for cervical lymphadenopathy on the right side.  Patient received an ultrasound which showed nonspecific lymphadenopathy in the right neck region of concern.  The largest lymph node measured 1.5 cm in the short axis.  Radiologist recommended contrast enhanced neck CT for further evaluation if clinically warranted.  Patient reports extreme distress and concern regarding lymphadenopathy.  Reports that it is stable at this time but still concerned her.  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  Hyperpigmentation around joints of second and third toes bilaterally Patient brings this up as a last minute concern.  She reports that she has had no issues and not seen this hyperpigmentation in the past.  Is concerned because she "feels like  her toes look like they are going to fall off".  Reports that she just got a new pair of work shoes and that when she sits in a chair she sits with her toes extended on the floor.  Denies any pain in these toes but is worried because it is new.  PERTINENT  PMH / PSH: Asthma  OBJECTIVE:   BP 112/62   Pulse 74   Ht 5\' 7"  (1.702 m)   Wt 242 lb 6.4 oz (110 kg)   LMP 09/20/2019   SpO2 98%   BMI 37.97 kg/m   General: Patient is well-appearing, sitting in chair in no acute distress HEENT: Patient has lymphadenopathy on posterior cervical chain. Cardio: Regular rate and rhythm, no murmurs appreciated, EKG showing sinus rhythm Respiratory: Normal work of breathing, clear and equal air movement in all lung fields, no wheezes noted, O2 saturation 98% on room air, regular respiratory rate Abdomen: Positive bowel sounds, soft. MSK: Second and third toes bilaterally showing hyperpigmented area around joint.  Unsure of etiology at this time.  Adequate cap refill, no decreased sensation, no tenderness to palpation.  See pictures below      ASSESSMENT/PLAN:   Lump on neck Unclear etiology at this time.  Patient had ultrasound showing lymphadenopathy in the right neck region.  Radiologist recommended contrast CT for further evaluation if clinically warranted.  Patient is extremely concerned about the cause of this lymphadenopathy.  CBC on last visit showed anemia with normal white count and differential within normal limits. -CT neck with contrast ordered -We will call  patient with results and further follow-up   Chest pain Patient presents after calling the after-hours line due to right-sided chest pain.  This chest pain was sudden onset, sharp, burning pain which resolved with antacid medications.  Patient also notes that onset occurred while she was searching for possible causes of her lymphadenopathy on Google and WebMD.  On exam patient denies any chest pain at this time.  EKG showed normal  sinus rhythm. -Cardiac cause for this is low on my differential given entire clinical picture -Symptoms may be related to acid reflux given resolution after treatment -May also be related to anxiety regarding cervical lymphadenopathy -Strict return precautions given  SOB (shortness of breath) on exertion Patient reports longstanding history of asthma for which she is prescribed albuterol for.  She has noticed shortness of breath on exertion for an extended period.  She feels that she has noticed it more since she started researching possible causes for her cervical lymphadenopathy and is wondering if it is related to her asthma or more psychosomatic in nature..  Denies any shortness of breath at rest.  Is able to ambulate on exam without difficulty.  Oxygen saturation was 98% on room air.  Lungs were clear to auscultation with adequate air movement in all lung fields.  Patient reports she has been using her albuterol inhaler every 2 hours for the last day. -Patient's shortness of breath is mild and appears more psychosomatic in nature.  No red flag symptoms noted -Recommended decreased use of albuterol unless noticing wheezes along with shortness of breath -Strict return precautions given      Gifford Shave, MD [facility]

## 2019-10-15 NOTE — Telephone Encounter (Signed)
**  After Hours/ Emergency Line Call**  Received a page to call [phone]) - [phone].  Patient: Colleen Peck  Caller: Self  Confirmed name & DOB of patient with caller  Subjective:  Caller reports right sided chest pain that is in the front of her chest and is a deep burning pain which started over the weekend.  She reports that it feels like she is going to have a asthma attack or kind of like a pneumonia, though she has not had any cough or persistent shortness of breath.  She describes her shortness of breath is very mild.  Feels likes "really bad indigestion" or does wonder if it could be psychosomatic. Denies fever, nausea, vomiting, chest pain on exertion.  Denies any upper respiratory symptoms.  She is requesting an appointment to be seen later today just to make sure it is nothing serious.  She does not feel that any of her symptoms are emergent or need to be addressed at the emergency department at this time.  Objective:  Observations: NAD, speaking in full sentences, no respiratory distress   Assessment & Plan  Colleen Peck is a 39 y.o. female who calls the after-hours line for feelings of anterior right-sided chest pain of burning quality.  She does not have any red flag signs or symptoms concerning for ACS, pulmonary embolism, pneumonia. Patient requesting to be seen in the office.  She does confirm that she does not have any respiratory symptoms.  Scheduled patient with Dr. Caryn Section on 10/15/2019 at 11:15 AM.  -- Recommendations: Take Tums to see if there is any improvement -- Will forward to PCP.  Wilber Oliphant, M.D.  PGY-2  [facility] 10/15/2019 3:51 AM

## 2019-10-15 NOTE — Assessment & Plan Note (Signed)
Patient reports longstanding history of asthma for which she is prescribed albuterol for.  She has noticed shortness of breath on exertion for an extended period.  She feels that she has noticed it more since she started researching possible causes for her cervical lymphadenopathy and is wondering if it is related to her asthma or more psychosomatic in nature..  Denies any shortness of breath at rest.  Is able to ambulate on exam without difficulty.  Oxygen saturation was 98% on room air.  Lungs were clear to auscultation with adequate air movement in all lung fields.  Patient reports she has been using her albuterol inhaler every 2 hours for the last day. -Patient's shortness of breath is mild and appears more psychosomatic in nature.  No red flag symptoms noted -Recommended decreased use of albuterol unless noticing wheezes along with shortness of breath -Strict return precautions given

## 2019-10-15 NOTE — Assessment & Plan Note (Signed)
Unclear etiology at this time.  Patient had ultrasound showing lymphadenopathy in the right neck region.  Radiologist recommended contrast CT for further evaluation if clinically warranted.  Patient is extremely concerned about the cause of this lymphadenopathy.  CBC on last visit showed anemia with normal white count and differential within normal limits. -CT neck with contrast ordered -We will call patient with results and further follow-up

## 2019-10-23 ENCOUNTER — Ambulatory Visit (HOSPITAL_COMMUNITY)
Admission: RE | Admit: 2019-10-23 | Discharge: 2019-10-23 | Disposition: A | Discharge status: Home / Self Care | Payer: Federal, State, Local not specified - PPO | Source: Ambulatory Visit | Attending: Family Medicine | Admitting: Family Medicine

## 2019-10-23 ENCOUNTER — Other Ambulatory Visit: Payer: Self-pay

## 2019-10-23 ENCOUNTER — Encounter (HOSPITAL_COMMUNITY): Payer: Self-pay

## 2019-10-23 DIAGNOSIS — R599 Enlarged lymph nodes, unspecified: Secondary | ICD-10-CM | POA: Diagnosis not present

## 2019-10-23 DIAGNOSIS — R221 Localized swelling, mass and lump, neck: Secondary | ICD-10-CM | POA: Insufficient documentation

## 2019-10-23 MED ORDER — SODIUM CHLORIDE (PF) 0.9 % IJ SOLN
INTRAMUSCULAR | Status: AC
  Filled 2019-10-23: qty 50

## 2019-10-23 MED ORDER — IOHEXOL 300 MG/ML  SOLN
75.0000 mL | Freq: Once | INTRAMUSCULAR | Status: AC | PRN
  Administered 2019-10-23: 75 mL via INTRAVENOUS

## 2019-10-24 ENCOUNTER — Telehealth: Payer: Self-pay | Admitting: Family Medicine

## 2019-10-24 ENCOUNTER — Encounter: Payer: Self-pay | Admitting: Family Medicine

## 2019-10-24 DIAGNOSIS — R221 Localized swelling, mass and lump, neck: Secondary | ICD-10-CM

## 2019-10-24 DIAGNOSIS — R599 Enlarged lymph nodes, unspecified: Secondary | ICD-10-CM

## 2019-10-24 NOTE — Telephone Encounter (Signed)
Spoke with patient regarding CT results.  I am placing a referral for ENT so that she can have a biopsy of that lymph node.  She voiced understanding and is hoping to get this appointment soon.

## 2019-10-29 ENCOUNTER — Other Ambulatory Visit: Payer: Self-pay

## 2019-10-29 ENCOUNTER — Ambulatory Visit (INDEPENDENT_AMBULATORY_CARE_PROVIDER_SITE_OTHER): Payer: Medicare Other

## 2019-10-29 ENCOUNTER — Telehealth: Payer: Self-pay

## 2019-10-29 ENCOUNTER — Ambulatory Visit (HOSPITAL_COMMUNITY)
Admission: EM | Admit: 2019-10-29 | Discharge: 2019-10-29 | Disposition: A | Discharge status: Home / Self Care | Payer: Medicare Other | Attending: Family Medicine | Admitting: Family Medicine

## 2019-10-29 ENCOUNTER — Encounter (HOSPITAL_COMMUNITY): Payer: Self-pay

## 2019-10-29 DIAGNOSIS — R0602 Shortness of breath: Secondary | ICD-10-CM | POA: Diagnosis not present

## 2019-10-29 DIAGNOSIS — J45901 Unspecified asthma with (acute) exacerbation: Secondary | ICD-10-CM

## 2019-10-29 MED ORDER — PREDNISONE 20 MG PO TABS: 20.0000 mg | ORAL_TABLET | Freq: Two times a day (BID) | ORAL | 0 refills | Status: DC

## 2019-10-29 MED ORDER — MONTELUKAST SODIUM 10 MG PO TABS: 10.0000 mg | ORAL_TABLET | Freq: Every day | ORAL | 0 refills | Status: DC

## 2019-10-29 NOTE — Progress Notes (Signed)
Addendum: PMH includes paranoia, PTSD, schizoaffective d/o (bipolar type), h/o borderline personality d/o NOT paranoid schizophrenia as previously stated.   Rory Percy, DO PGY-3, [facility] 10/29/2019 6:22 PM

## 2019-10-29 NOTE — Discharge Instructions (Addendum)
Take the prednisone 2 x a day for 5 days Take the montelukast daily This is to help prevent asthma with allergies See your PCP in followup Drink lots of water

## 2019-10-29 NOTE — ED Provider Notes (Signed)
[place]    CSN: [id number] Arrival date & time: 10/29/19  1611      History   Chief Complaint Chief Complaint  Patient presents with  . Asthma    HPI Colleen Peck is a 39 y.o. female.   HPI  Patient has a history of asthma.  Her asthma is been flared up since 10/15/2019.  She works at the [employer].  She does not think she has been exposed to Covid.  She has no fever chills, no body ache or headache, no loss of taste or smell.  She has been using her asthma inhaler.  There is no shortness of breath feels like it is getting worse.  No cough or sputum production.  She does not feel like she has an infection.  She does feel like she is having a bad allergy year.  Past Medical History:  Diagnosis Date  . Abnormal pap    pt reports abnl pap many years ago.  Nl since then.  . Allergy    seasonal  . Anemia   . Asthma   . Bipolar disorder ([place])   . Depression   . Palpitations 03/12/2008  . PTSD (post-traumatic stress disorder)   . Seasonal allergies   . Thyroid disease 2009   Graves disease (pt reported resolved); hypothyriodism    Patient Active Problem List   Diagnosis Date Noted  . Chest pain 10/15/2019  . Lump on neck 09/30/2019  . Sciatic nerve pain, left 07/16/2019  . Close exposure to COVID-19 virus 07/16/2019  . Acute back pain with radiculopathy 01/22/2019  . Onycholysis 11/14/2018  . Palpable lymph node 10/05/2018  . Cyclical mastalgia [id number]  . PMS (premenstrual syndrome) 08/20/2018  . Vertigo 05/31/2018  . Dermatofibroma of right lower leg 01/06/2017  . History of borderline personality disorder 01/01/2016  . Carotidynia 12/16/2015  . Schizoaffective disorder, bipolar type ([place]) 11/26/2015  . Anxiety   . PTSD (post-traumatic stress disorder) 09/30/2015  . Routine screening for STI (sexually transmitted infection)   . Paranoia ([place])   . Hypothyroidism 02/26/2015  . Anemia 01/30/2015  . Vitamin D deficiency 05/22/2013  . SOB  (shortness of breath) on exertion 07/13/2012  . Obesity 09/08/2006  . Allergic rhinitis 09/08/2006  . Asthma 09/08/2006  . ECZEMA, ATOPIC DERMATITIS 09/08/2006    Past Surgical History:  Procedure Laterality Date  . DILATION AND CURETTAGE OF UTERUS  March 2006    OB History   No obstetric history on file.      Home Medications    Prior to Admission medications   Medication Sig Start Date End Date Taking? Authorizing Provider  ARIPiprazole (ABILIFY) 2 MG tablet Take 2 mg by mouth daily. 10/05/19   [provider]  ARIPiprazole (ABILIFY) 5 MG tablet Take 5 mg by mouth daily. 05/03/19   [provider]  beclomethasone (QVAR) 80 MCG/ACT inhaler Inhale 2 puffs into the lungs 2 (two) times daily.    [provider]  cyclobenzaprine (FLEXERIL) 5 MG tablet Take 1 tablet (5 mg total) by mouth 3 (three) times daily as needed for muscle spasms. 07/16/19   Gifford Shave, MD  fluticasone (FLONASE) 50 MCG/ACT nasal spray Place 2 sprays into both nostrils daily. 11/14/18   Bufford Lope, DO  lamoTRIgine (LAMICTAL) 200 MG tablet Take 1 tablet (200 mg total) by mouth daily. Take 1 tablet (25 mg) in the morning & 2 tablets (50 mg) in the evening: For mood stabilization 11/24/17   Nicolette Bang,  DO  levothyroxine (SYNTHROID) 112 MCG tablet TAKE 1 TABLET BY MOUTH DAILY 07/31/19   Meccariello, Bernita Raisin, DO  montelukast (SINGULAIR) 10 MG tablet Take 1 tablet (10 mg total) by mouth at bedtime. 10/29/19   Raylene Everts, MD  predniSONE (DELTASONE) 20 MG tablet Take 1 tablet (20 mg total) by mouth 2 (two) times daily with a meal. 10/29/19   Raylene Everts, MD  albuterol (PROVENTIL) (2.5 MG/3ML) 0.083% nebulizer solution Take 3 mLs (2.5 mg total) by nebulization every 6 (six) hours as needed for wheezing or shortness of breath. Patient not taking: Reported on 01/09/2019 11/14/18 05/03/19  Bufford Lope, DO    Family History Family History  Problem Relation Age of Onset    . Drug abuse Father   . Depression Maternal Grandmother   . Anxiety disorder Maternal Grandmother   . COPD Maternal Grandmother   . Suicidality Cousin   . Depression Cousin   . Bipolar disorder Cousin   . Hypertension Mother   . Depression Mother   . Diabetes Paternal Grandfather   . COPD Paternal Grandmother   . Depression Maternal Aunt   . Breast cancer Maternal Aunt   . Depression Maternal Aunt   . Heart disease Neg Hx     Social History Social History   Tobacco Use  . Smoking status: Never Smoker  . Smokeless tobacco: Never Used  Substance Use Topics  . Alcohol use: No  . Drug use: Yes    Types: Marijuana    Comment: past use of marijuana in '08-'09. occasional eats brownies w/ marijuana       Allergies   Kiwi extract   Review of Systems Review of Systems  Constitutional: Negative for appetite change, chills and fever.  Respiratory: Positive for shortness of breath and wheezing. Negative for cough.   Gastrointestinal: Negative for nausea and vomiting.  Musculoskeletal: Negative for myalgias.  Neurological: Negative for headaches.     Physical Exam Triage Vital Signs ED Triage Vitals  Enc Vitals Group     BP 10/29/19 1648 114/77     Pulse Rate 10/29/19 1648 80     Resp 10/29/19 1648 19     Temp 10/29/19 1648 98.5 F (36.9 C)     Temp Source 10/29/19 1648 Oral     SpO2 10/29/19 1648 99 %     Weight 10/29/19 1651 251 lb 12.8 oz (114.2 kg)     Height --      Head Circumference --      Peak Flow --      Pain Score 10/29/19 1646 0     Pain Loc --      Pain Edu? --      Excl. in [place]? --    No data found.  Updated Vital Signs BP 114/77 (BP Location: Right Arm)   Pulse 80   Temp 98.5 F (36.9 C) (Oral)   Resp 19   Wt 114.2 kg   LMP 10/23/2019 (Approximate)   SpO2 99%   BMI 39.44 kg/m     Physical Exam Constitutional:      General: She is not in acute distress.    Appearance: She is well-developed.     Comments: Overweight.  Pleasant.  No  acute distress  HENT:     Head: Normocephalic and atraumatic.     Nose:     Comments: Mask is in place.  Oropharynx benign Eyes:     Conjunctiva/sclera: Conjunctivae normal.     Pupils:  Pupils are equal, round, and reactive to light.  Cardiovascular:     Rate and Rhythm: Normal rate and regular rhythm.     Heart sounds: Normal heart sounds.  Pulmonary:     Effort: Pulmonary effort is normal. No respiratory distress.     Breath sounds: Rales present.     Comments: Few rales heard centrally.  No wheeze.  No rhonchi Musculoskeletal:        General: Normal range of motion.     Cervical back: Normal range of motion.  Skin:    General: Skin is warm and dry.  Neurological:     Mental Status: She is alert.  Psychiatric:        Mood and Affect: Mood normal.        Behavior: Behavior normal.      UC Treatments / Results  Labs (all labs ordered are listed, but only abnormal results are displayed) Labs Reviewed - No data to display  EKG   Radiology DG Chest 2 View  Result Date: 10/29/2019 CLINICAL DATA:  Short of breath EXAM: CHEST - 2 VIEW COMPARISON:  10/28/2017 FINDINGS: The heart size and mediastinal contours are within normal limits. Both lungs are clear. The visualized skeletal structures are unremarkable. IMPRESSION: No active cardiopulmonary disease. Electronically Signed   By: Donavan Foil M.D.   On: 10/29/2019 18:48    Procedures Procedures (including critical care time)  Medications Ordered in UC Medications - No data to display  Initial Impression / Assessment and Plan / UC Course  I have reviewed the triage vital signs and the nursing notes.  Pertinent labs & imaging results that were available during my care of the patient were reviewed by me and considered in my medical decision making (see chart for details).     *Because I heard rales and still with expected wheeze I did do a chest x-ray.  It was negative.  I was considering a diagnosis of Covid.  I think  this is unlikely given her normal chest x-ray and her negative exposure history and her negative symptom history.  I will treat her for asthma.  Of advised her to return if she gets worse instead of better at any time Final Clinical Impressions(s) / UC Diagnoses   Final diagnoses:  Moderate asthma with exacerbation, unspecified whether persistent     Discharge Instructions     Take the prednisone 2 x a day for 5 days Take the montelukast daily This is to help prevent asthma with allergies See your PCP in followup Drink lots of water   ED Prescriptions    Medication Sig Dispense Auth. Provider   montelukast (SINGULAIR) 10 MG tablet Take 1 tablet (10 mg total) by mouth at bedtime. 30 tablet Raylene Everts, MD   predniSONE (DELTASONE) 20 MG tablet Take 1 tablet (20 mg total) by mouth 2 (two) times daily with a meal. 10 tablet Raylene Everts, MD     PDMP not reviewed this encounter.   Raylene Everts, MD 10/29/19 Einar Crow

## 2019-10-29 NOTE — Telephone Encounter (Signed)
Called patient.  She is planning to go to Urgent Care after work.  No evidence of respiratory distress over the phone.  Patient speaking in complete sentences.  Has been having continued shortness of breath and albuterol use multiple times daily since being seen on 4/5.  Offered appointment in AM, but none available, therefore patient will proceed with being seen in Urgent Care today.

## 2019-10-29 NOTE — ED Triage Notes (Signed)
Pt is here asthmatic since 10/15/2019, pt states she uses her inhaler and nebulizer frequently.

## 2019-10-29 NOTE — Telephone Encounter (Signed)
Patient calls nurse line regarding increasing SHOB, chest pain and increased fatigue. Patient has history of asthma and reports increased usage of inhalers and nebulizer treatments. Patient currently receiving nebulizer treatment. Due to worsening of patient's symptoms advised patient to be evaluated in the ER.   FYI to PCP  Talbot Grumbling, RN

## 2019-11-01 ENCOUNTER — Telehealth (INDEPENDENT_AMBULATORY_CARE_PROVIDER_SITE_OTHER): Payer: Medicare Other | Admitting: Family Medicine

## 2019-11-01 ENCOUNTER — Emergency Department (HOSPITAL_COMMUNITY)
Admission: EM | Admit: 2019-11-01 | Discharge: 2019-11-01 | Disposition: A | Discharge status: Home / Self Care | Payer: Federal, State, Local not specified - PPO | Attending: Emergency Medicine | Admitting: Emergency Medicine

## 2019-11-01 ENCOUNTER — Emergency Department (HOSPITAL_COMMUNITY): Payer: Federal, State, Local not specified - PPO

## 2019-11-01 ENCOUNTER — Other Ambulatory Visit: Payer: Self-pay

## 2019-11-01 ENCOUNTER — Encounter (HOSPITAL_COMMUNITY): Payer: Self-pay

## 2019-11-01 VITALS — Ht 67.0 in

## 2019-11-01 DIAGNOSIS — R0602 Shortness of breath: Secondary | ICD-10-CM | POA: Diagnosis not present

## 2019-11-01 DIAGNOSIS — Z79899 Other long term (current) drug therapy: Secondary | ICD-10-CM | POA: Insufficient documentation

## 2019-11-01 DIAGNOSIS — E039 Hypothyroidism, unspecified: Secondary | ICD-10-CM | POA: Diagnosis not present

## 2019-11-01 DIAGNOSIS — Z20822 Contact with and (suspected) exposure to covid-19: Secondary | ICD-10-CM

## 2019-11-01 DIAGNOSIS — J45909 Unspecified asthma, uncomplicated: Secondary | ICD-10-CM | POA: Diagnosis not present

## 2019-11-01 LAB — SARS CORONAVIRUS 2 (TAT 6-24 HRS): SARS Coronavirus 2: NEGATIVE

## 2019-11-01 NOTE — Progress Notes (Signed)
[place] Telemedicine Visit  Patient consented to have virtual visit and was identified by name and date of birth. Method of visit: Video  Encounter participants: Patient: Cati Uhle - located at home in [facility] Provider: Nuala Alpha - located at [facility] Others (if applicable): None  Chief Complaint: SOB and difficulty breathing  HPI:  Patient is a 39y/o female with PMH of allergies and asthma who began having SOB a few days ago. She has been using her nebulizer treatments more often and was recently in urgent care and given prednisone for an acute asthma exacerbation. Her SOB and difficulty breathing have been getting progressively worse over the past few days and now has SOB upon walking to the bathroom. She states the predisone helps for a little bit but then she feels the same as before. She found out yesterday a co-worker was diagnosed with COVID. He was not wearing a mask in the work area. She was wearing her mask but is concerned she may have COVID. She has not been vaccinated.   ROS: per HPI  Pertinent PMHx: Allergies, Asthma  Exam:  Ht 5\' 7"  (1.702 m)   LMP 10/23/2019 (Approximate)   BMI 39.44 kg/m   Respiratory: speaking in full sentences comfortably  Assessment/Plan:  Close exposure to COVID-19 virus Difficult to assess patient via video but given she reports a known exposure to a COVID postive patient and she is not responding well to prednisone and increase nebulizer treatments I have instructed patient to go to the ED. She is reporting SOB just walking to the bathroom and I am concerned her O2 sats are low and may require oxygen. - patient to go to the ED. She states she has safe transportation to ED.    Time spent during visit with patient: >30 minutes  Harolyn Rutherford, DO [place], PGY-3

## 2019-11-01 NOTE — ED Notes (Signed)
Pt ambulated in the room pt ambulated well pts 02 sats remained at 100 no complaints noted at this time

## 2019-11-01 NOTE — ED Triage Notes (Signed)
Pt arrives to ED w/ c/o covid expoxure. Pt endorses SOB, denies cough, fever.

## 2019-11-01 NOTE — Assessment & Plan Note (Signed)
Difficult to assess patient via video but given she reports a known exposure to a COVID postive patient and she is not responding well to prednisone and increase nebulizer treatments I have instructed patient to go to the ED. She is reporting SOB just walking to the bathroom and I am concerned her O2 sats are low and may require oxygen. - patient to go to the ED. She states she has safe transportation to ED.

## 2019-11-01 NOTE — Discharge Instructions (Signed)
As discussed, your chest x-ray was negative for signs of pneumonia.  Your Covid results should be available within the next 24 hours.  Continue to self quarantine until your results become available.  Continue taking your prednisone and Singulair as prescribed by urgent care.  Please follow-up with PCP within the next week if symptoms do not improve.  Return to the ER for new or worsening symptoms.  If you do not have a PCP I included the number for [facility].

## 2019-11-01 NOTE — ED Notes (Signed)
Patient given discharge instructions patient verbalizes understanding. 

## 2019-11-01 NOTE — ED Provider Notes (Signed)
[facility] EMERGENCY DEPARTMENT Provider Note   CSN: [id number] Arrival date & time: 11/01/19  1247     History Chief Complaint  Patient presents with  . Covid Exposure    Colleen Peck is a 39 y.o. female with a past medical history significant for asthma, bipolar disorder, depression, seasonal allergies, anemia, Graves' disease, and PTSD who presents to the ED due to shortness of breath for the past few days. Patient was evaluated in urgent care on 4/19 and is currently being treated for asthma exacerbation with prednisone and Singulair which she has been compliant with. Patient notes shortness of breath has remained stable; however, she was just informed that she had 2 positive Covid exposures and is worried about possible Covid infection. Patient notes shortness of breath is worse with exertion and when speaking. Patient denies associated cough, fever, headache, loss of taste/smell, sore throat, abdominal pain, nausea, vomiting, diarrhea. She notes her shortness of breath "feels like pneumonia". She admits to using her albuterol and nebulizer treatments as needed for shortness of breath with improvement in symptoms. Denies lower extremity edema, history of blood clots, recent surgeries, recent long immobilizations, and hormonal treatments. Denies chest pain and palpitations.  History obtained from patient and past medical records. No interpreter used during encounter.      Past Medical History:  Diagnosis Date  . Abnormal pap    pt reports abnl pap many years ago.  Nl since then.  . Allergy    seasonal  . Anemia   . Asthma   . Bipolar disorder ([place])   . Depression   . Palpitations 03/12/2008  . PTSD (post-traumatic stress disorder)   . Seasonal allergies   . Thyroid disease 2009   Graves disease (pt reported resolved); hypothyriodism    Patient Active Problem List   Diagnosis Date Noted  . Chest pain 10/15/2019  . Lump on neck 09/30/2019  . Sciatic  nerve pain, left 07/16/2019  . Close exposure to COVID-19 virus 07/16/2019  . Acute back pain with radiculopathy 01/22/2019  . Onycholysis 11/14/2018  . Palpable lymph node 10/05/2018  . Cyclical mastalgia Q000111Q  . PMS (premenstrual syndrome) 08/20/2018  . Vertigo 05/31/2018  . Dermatofibroma of right lower leg 01/06/2017  . History of borderline personality disorder 01/01/2016  . Carotidynia 12/16/2015  . Schizoaffective disorder, bipolar type ([place]) 11/26/2015  . Anxiety   . PTSD (post-traumatic stress disorder) 09/30/2015  . Routine screening for STI (sexually transmitted infection)   . Paranoia ([place])   . Hypothyroidism 02/26/2015  . Anemia 01/30/2015  . Vitamin D deficiency 05/22/2013  . SOB (shortness of breath) on exertion 07/13/2012  . Obesity 09/08/2006  . Allergic rhinitis 09/08/2006  . Asthma 09/08/2006  . ECZEMA, ATOPIC DERMATITIS 09/08/2006    Past Surgical History:  Procedure Laterality Date  . DILATION AND CURETTAGE OF UTERUS  March 2006     OB History   No obstetric history on file.     Family History  Problem Relation Age of Onset  . Drug abuse Father   . Depression Maternal Grandmother   . Anxiety disorder Maternal Grandmother   . COPD Maternal Grandmother   . Suicidality Cousin   . Depression Cousin   . Bipolar disorder Cousin   . Hypertension Mother   . Depression Mother   . Diabetes Paternal Grandfather   . COPD Paternal Grandmother   . Depression Maternal Aunt   . Breast cancer Maternal Aunt   . Depression Maternal Aunt   .  Heart disease Neg Hx     Social History   Tobacco Use  . Smoking status: Never Smoker  . Smokeless tobacco: Never Used  Substance Use Topics  . Alcohol use: No  . Drug use: Yes    Types: Marijuana    Comment: past use of marijuana in '08-'09. occasional eats brownies w/ marijuana      Home Medications Prior to Admission medications   Medication Sig Start Date End Date Taking? Authorizing Provider    ARIPiprazole (ABILIFY) 2 MG tablet Take 2 mg by mouth daily. 10/05/19  Yes [provider]  beclomethasone (QVAR) 80 MCG/ACT inhaler Inhale 2 puffs into the lungs daily as needed (For [facility]).    Yes [provider]  lamoTRIgine (LAMICTAL) 200 MG tablet Take 1 tablet (200 mg total) by mouth daily. Take 1 tablet (25 mg) in the morning & 2 tablets (50 mg) in the evening: For mood stabilization Patient taking differently: Take 200 mg by mouth at bedtime. Take 1 tablet (25 mg) in the morning & 2 tablets (50 mg) in the evening: For mood stabilizatio 11/24/17  Yes Nicolette Bang, DO  levothyroxine (SYNTHROID) 112 MCG tablet TAKE 1 TABLET BY MOUTH DAILY 07/31/19  Yes Meccariello, Bernita Raisin, DO  predniSONE (DELTASONE) 20 MG tablet Take 1 tablet (20 mg total) by mouth 2 (two) times daily with a meal. 10/29/19  Yes Raylene Everts, MD  ARIPiprazole (ABILIFY) 5 MG tablet Take 5 mg by mouth daily. 05/03/19   [provider]  cyclobenzaprine (FLEXERIL) 5 MG tablet Take 1 tablet (5 mg total) by mouth 3 (three) times daily as needed for muscle spasms. Patient not taking: Reported on 11/01/2019 07/16/19   Gifford Shave, MD  fluticasone [facility]) 50 MCG/ACT nasal spray Place 2 sprays into both nostrils daily. Patient not taking: Reported on 11/01/2019 11/14/18   Bufford Lope, DO  montelukast (SINGULAIR) 10 MG tablet Take 1 tablet (10 mg total) by mouth at bedtime. Patient not taking: Reported on 11/01/2019 10/29/19   Raylene Everts, MD  albuterol (PROVENTIL) (2.5 MG/3ML) 0.083% nebulizer solution Take 3 mLs (2.5 mg total) by nebulization every 6 (six) hours as needed for wheezing or shortness of breath. Patient not taking: Reported on 01/09/2019 11/14/18 05/03/19  Bufford Lope, DO    Allergies    Kiwi extract  Review of Systems   Review of Systems  Constitutional: Negative for chills and fever.  HENT: Negative for congestion, rhinorrhea and sore throat.   Respiratory: Positive  for shortness of breath. Negative for cough.   Cardiovascular: Negative for chest pain, palpitations and leg swelling.  Gastrointestinal: Negative for abdominal pain, diarrhea, nausea and vomiting.  Neurological: Negative for headaches.  All other systems reviewed and are negative.   Physical Exam Updated Vital Signs BP 106/72 (BP Location: Left Arm)   Pulse 63   Temp 98.7 F (37.1 C) (Oral)   Resp 18   Ht 5\' 7"  (1.702 m)   Wt 108.9 kg   LMP 10/23/2019 (Approximate)   SpO2 100%   BMI 37.59 kg/m   Physical Exam Vitals and nursing note reviewed.  Constitutional:      General: She is not in acute distress.    Appearance: She is not ill-appearing.  HENT:     Head: Normocephalic.  Eyes:     Pupils: Pupils are equal, round, and reactive to light.  Neck:     Comments: No meningismus. Cardiovascular:     Rate and Rhythm: Normal  rate and regular rhythm.     Pulses: Normal pulses.     Heart sounds: Normal heart sounds. No murmur. No friction rub. No gallop.   Pulmonary:     Effort: Pulmonary effort is normal.     Breath sounds: Normal breath sounds.     Comments: Respirations equal and unlabored, patient able to speak in full sentences, lungs clear to auscultation bilaterally Abdominal:     General: Abdomen is flat. There is no distension.     Palpations: Abdomen is soft.     Tenderness: There is no abdominal tenderness. There is no guarding or rebound.  Musculoskeletal:     Cervical back: Neck supple.     Comments: No lower extremity edema. Negative Homans sign bilaterally.  Skin:    General: Skin is warm and dry.  Neurological:     General: No focal deficit present.     Mental Status: She is alert.  Psychiatric:        Mood and Affect: Mood normal.        Behavior: Behavior normal.     ED Results / Procedures / Treatments   Labs (all labs ordered are listed, but only abnormal results are displayed) Labs Reviewed  SARS CORONAVIRUS 2 (TAT 6-24 HRS)     EKG None  Radiology DG Chest Portable 1 View  Result Date: 11/01/2019 CLINICAL DATA:  38 year old female with acute shortness of breath. EXAM: PORTABLE CHEST 1 VIEW COMPARISON:  10/29/2019 FINDINGS: The cardiomediastinal silhouette is unremarkable. There is no evidence of focal airspace disease, pulmonary edema, suspicious pulmonary nodule/mass, pleural effusion, or pneumothorax. No acute bony abnormalities are identified. IMPRESSION: No active disease. Electronically Signed   By: Margarette Canada M.D.   On: 11/01/2019 16:23    Procedures Procedures (including critical care time)  Medications Ordered in ED Medications - No data to display  ED Course  I have reviewed the triage vital signs and the nursing notes.  Pertinent labs & imaging results that were available during my care of the patient were reviewed by me and considered in my medical decision making (see chart for details).    MDM Rules/Calculators/A&P                     39 year old female presents to the ED due to shortness of breath for the past few days. Patient was evaluated in urgent care on 4/19 and is currently being treated for an asthma exacerbation with prednisone and Singulair which she has been compliant with. Patient notes shortness of breath has remained stable since her urgent care visit however she is concerned about possible Covid infection after 2 positive exposures. Vitals all within normal limits. Patient in no acute distress and non-ill-appearing. Physical exam reassuring. Lungs clear to auscultation bilaterally. Patient speaking full sentences. No accessory muscle usage. Will obtain PCR Covid test and chest x-ray to rule out pneumonia.  PERC negative and low risk using Wells criteria.  Doubt PE/DVT.  X-ray personally reviewed which is negative for signs of pneumonia, pneumothorax, or widened mediastinum.  Covid test pending.  No signs of hypoxia on exam today. Patient able to ambulate here in the ED and  maintained O2 saturation >95%. Advised patient to continue taking her prednisone and Singulair as prescribed by urgent care.  Self quarantine guidelines discussed with patient.  Instructed patient to follow-up with PCP if symptoms do not improve within the next week. Strict ED precautions discussed with patient. Patient states understanding and agrees to plan.  Patient discharged home in no acute distress and stable vitals.   Colleen Peck was evaluated in Emergency Department on 11/01/2019 for the symptoms described in the history of present illness. She was evaluated in the context of the global COVID-19 pandemic, which necessitated consideration that the patient might be at risk for infection with the SARS-CoV-2 virus that causes COVID-19. Institutional protocols and algorithms that pertain to the evaluation of patients at risk for COVID-19 are in a state of rapid change based on information released by regulatory bodies including the CDC and federal and state organizations. These policies and algorithms were followed during the patient's care in the ED.  Final Clinical Impression(s) / ED Diagnoses Final diagnoses:  Shortness of breath  Exposure to COVID-19 virus    Rx / DC Orders ED Discharge Orders    None       Karie Kirks 11/01/19 1649    Wyvonnia Dusky, MD 11/01/19 1820

## 2019-11-07 DIAGNOSIS — F319 Bipolar disorder, unspecified: Secondary | ICD-10-CM | POA: Diagnosis not present

## 2019-11-12 ENCOUNTER — Other Ambulatory Visit: Payer: Self-pay

## 2019-11-12 ENCOUNTER — Ambulatory Visit (INDEPENDENT_AMBULATORY_CARE_PROVIDER_SITE_OTHER): Payer: Medicare Other | Admitting: Otolaryngology

## 2019-11-12 ENCOUNTER — Encounter (INDEPENDENT_AMBULATORY_CARE_PROVIDER_SITE_OTHER): Payer: Self-pay | Admitting: Otolaryngology

## 2019-11-12 VITALS — Temp 97.7°F

## 2019-11-12 DIAGNOSIS — R590 Localized enlarged lymph nodes: Secondary | ICD-10-CM

## 2019-11-12 DIAGNOSIS — R591 Generalized enlarged lymph nodes: Secondary | ICD-10-CM

## 2019-11-12 DIAGNOSIS — F250 Schizoaffective disorder, bipolar type: Secondary | ICD-10-CM | POA: Diagnosis not present

## 2019-11-12 NOTE — Progress Notes (Signed)
HPI: Colleen Peck is a 38 y.o. female who presents is referred by Dr. Owens Shark for evaluation of right neck lymphadenopathy.  Patient initially noticed a lump in her right neck about 5 weeks ago after she had massage therapy on her neck and was feeling her neck and noticed a lump..  She initially had an ultrasound which showed a 1 and half centimeter nonspecific lymph node in the right neck and then had a CT scan of the neck performed 2 weeks later that showed a group of lymph nodes in the right lower neck that had enlarged compared to a CT scan performed a year earlier with the largest measuring 17 x 19 mm in size.  Biopsy was recommended by radiology. Patient denies any recent illness.  No sore throat.  No hoarseness.  Denies any infections of the right side of her neck. Patient does have history of hypothyroidism secondary to Graves' disease.  Also history of bipolar disorder.  Past Medical History:  Diagnosis Date  . Abnormal pap    pt reports abnl pap many years ago.  Nl since then.  . Allergy    seasonal  . Anemia   . Asthma   . Bipolar disorder ([place])   . Depression   . Palpitations 03/12/2008  . PTSD (post-traumatic stress disorder)   . Seasonal allergies   . Thyroid disease 2009   Graves disease (pt reported resolved); hypothyriodism   Past Surgical History:  Procedure Laterality Date  . DILATION AND CURETTAGE OF UTERUS  March 2006   Social History   Socioeconomic History  . Marital status: Single    Spouse name: Not on file  . Number of children: Not on file  . Years of education: Not on file  . Highest education level: Not on file  Occupational History  . Not on file  Tobacco Use  . Smoking status: Never Smoker  . Smokeless tobacco: Never Used  Substance and Sexual Activity  . Alcohol use: No  . Drug use: Yes    Types: Marijuana    Comment: past use of marijuana in '08-'09. occasional eats brownies w/ marijuana    . Sexual activity: Not Currently    Birth  control/protection: Abstinence  Other Topics Concern  . 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   Social Determinants of Health   Financial Resource Strain:   . Difficulty of Paying Living Expenses:   Food Insecurity:   . Worried About Charity fundraiser in the Last Year:   . Arboriculturist in the Last Year:   Transportation Needs:   . Film/video editor (Medical):   Marland Kitchen Lack of Transportation (Non-Medical):   Physical Activity:   . Days of Exercise per Week:   . Minutes of Exercise per Session:   Stress:   . Feeling of Stress :   Social Connections:   . Frequency of Communication with Friends and Family:   . Frequency of Social Gatherings with Friends and Family:   . Attends Religious Services:   . Active Member of Clubs or Organizations:   . Attends Archivist Meetings:   Marland Kitchen Marital Status:    Family History  Problem Relation Age of Onset  . Drug abuse Father   . Depression Maternal Grandmother   . Anxiety disorder Maternal Grandmother   . COPD Maternal Grandmother   . Suicidality Cousin   .  Depression Cousin   . Bipolar disorder Cousin   . Hypertension Mother   . Depression Mother   . Diabetes Paternal Grandfather   . COPD Paternal Grandmother   . Depression Maternal Aunt   . Breast cancer Maternal Aunt   . Depression Maternal Aunt   . Heart disease Neg Hx    Allergies  Allergen Reactions  . Kiwi Extract Swelling   Prior to Admission medications   Medication Sig Start Date End Date Taking? Authorizing Provider  ARIPiprazole (ABILIFY) 2 MG tablet Take 2 mg by mouth daily. 10/05/19  Yes [provider]  ARIPiprazole (ABILIFY) 5 MG tablet Take 5 mg by mouth daily. 05/03/19  Yes [provider]  beclomethasone (QVAR) 80 MCG/ACT inhaler Inhale 2 puffs into the lungs daily as needed (For [facility]).    Yes [provider]  cyclobenzaprine  (FLEXERIL) 5 MG tablet Take 1 tablet (5 mg total) by mouth 3 (three) times daily as needed for muscle spasms. 07/16/19  Yes Gifford Shave, MD  fluticasone (FLONASE) 50 MCG/ACT nasal spray Place 2 sprays into both nostrils daily. 11/14/18  Yes Bufford Lope, DO  lamoTRIgine (LAMICTAL) 200 MG tablet Take 1 tablet (200 mg total) by mouth daily. Take 1 tablet (25 mg) in the morning & 2 tablets (50 mg) in the evening: For mood stabilization Patient taking differently: Take 200 mg by mouth at bedtime. Take 1 tablet (25 mg) in the morning & 2 tablets (50 mg) in the evening: For mood stabilizatio 11/24/17  Yes Nicolette Bang, DO  levothyroxine (SYNTHROID) 112 MCG tablet TAKE 1 TABLET BY MOUTH DAILY 07/31/19  Yes Meccariello, Bernita Raisin, DO  montelukast (SINGULAIR) 10 MG tablet Take 1 tablet (10 mg total) by mouth at bedtime. 10/29/19  Yes Raylene Everts, MD  predniSONE (DELTASONE) 20 MG tablet Take 1 tablet (20 mg total) by mouth 2 (two) times daily with a meal. Patient not taking: Reported on 11/12/2019 10/29/19   Raylene Everts, MD  albuterol (PROVENTIL) (2.5 MG/3ML) 0.083% nebulizer solution Take 3 mLs (2.5 mg total) by nebulization every 6 (six) hours as needed for wheezing or shortness of breath. Patient not taking: Reported on 01/09/2019 11/14/18 05/03/19  Bufford Lope, DO     Positive ROS: Otherwise negative  All other systems have been reviewed and were otherwise negative with the exception of those mentioned in the HPI and as above.  Physical Exam: Constitutional: Alert, well-appearing, no acute distress Ears: External ears without lesions or tenderness. Ear canals are clear bilaterally with intact, clear TMs.  Nasal: External nose without lesions. Septum midline with mild rhinitis.. Clear nasal passages with no signs of infection. Oral: Lips and gums without lesions. Tongue and palate mucosa without lesions. Posterior oropharynx clear.  Symmetric in appearance and are 2+ bilaterally with  no exudate.  Indirect laryngoscopy revealed a clear base of tongue vallecula and epiglottis.  Hypopharynx was clear. Fiberoptic laryngoscopy was performed through the right nostril.  Mildly prominent adenoid tissue appeared benign mucosal covered.  Base of tongue vallecula epiglottis were normal.  Vocal cords were clear bilaterally and hypopharynx was clear. Neck: Patient has a easily palpable moderate size approximately 1.7 cm lymph node just posterior to the sternocleidomastoid muscle on the right side along the lower or mid accessory chain of lymph nodes.  No obvious supraclavicular adenopathy noted. Respiratory: Breathing comfortably Cardiac: Regular rate and rhythm without murmur Skin: No facial/neck lesions or rash noted.  Laryngoscopy  Date/Time: 11/12/2019 5:14 PM Performed  by: Rozetta Nunnery, MD Authorized by: Rozetta Nunnery, MD   Consent:    Consent obtained:  Verbal   Consent given by:  Patient Procedure details:    Indications: direct visualization of the upper aerodigestive tract     Scope location: right nare   Sinus:    Right nasopharynx: normal   Mouth:    Oropharynx: normal     Vallecula: normal     Base of tongue: normal     Epiglottis: normal   Throat:    True vocal cords: normal   Comments:     On fiberoptic laryngoscopy she had mildly prominent benign-appearing adenoid tissue in midline.  Hypopharynx and larynx are otherwise clear.    Assessment: Right neck lymphadenopathy  Plan: On clinical exam there is no evidence of head neck cancer.  Could possibly represent lymphoma versus reactive lymphadenopathy. Reviewed this with the patient and discussed excisional biopsy of the right neck lymph node and we will plan on scheduling this at her convenience in the next week or 2.   Radene Journey, MD   CC:

## 2019-11-16 ENCOUNTER — Ambulatory Visit (INDEPENDENT_AMBULATORY_CARE_PROVIDER_SITE_OTHER): Payer: Self-pay | Admitting: Otolaryngology

## 2019-11-16 ENCOUNTER — Other Ambulatory Visit: Payer: Self-pay

## 2019-11-16 ENCOUNTER — Encounter (HOSPITAL_BASED_OUTPATIENT_CLINIC_OR_DEPARTMENT_OTHER): Payer: Self-pay | Admitting: Otolaryngology

## 2019-11-16 DIAGNOSIS — R590 Localized enlarged lymph nodes: Secondary | ICD-10-CM

## 2019-11-16 NOTE — H&P (Signed)
PREOPERATIVE H&P  Chief Complaint: Enlarged right neck lymph node  HPI: Genetha Colliver is a 39 y.o. female who presents for evaluation of enlarged right neck lymph node for possible excisional biopsy.  Patient has had a CT scan of her neck that showed an enlarging right neck lymph node where she had a group of a couple of lymph nodes in the posterior right neck.  The largest measuring approximately 1.9 cm in size.  Radiologist recommended biopsy. On examination the office patient has a fairly easily palpable lymph node in the posterior right neck just behind the sternocleidomastoid muscle in the region of the sensory chain of lymph nodes. She is taken to the operating room at this time for excisional biopsy of the posterior right neck node.  Past Medical History:  Diagnosis Date  . Abnormal pap    pt reports abnl pap many years ago.  Nl since then.  . Allergy    seasonal  . Anemia   . Asthma   . Bipolar disorder ([place])   . Depression   . Palpitations 03/12/2008  . PTSD (post-traumatic stress disorder)   . Seasonal allergies   . Thyroid disease 2009   Graves disease (pt reported resolved); hypothyriodism   Past Surgical History:  Procedure Laterality Date  . DILATION AND CURETTAGE OF UTERUS  March 2006   Social History   Socioeconomic History  . Marital status: Single    Spouse name: Not on file  . Number of children: Not on file  . Years of education: Not on file  . Highest education level: Not on file  Occupational History  . Not on file  Tobacco Use  . Smoking status: Never Smoker  . Smokeless tobacco: Never Used  Substance and Sexual Activity  . Alcohol use: Yes    Comment: occ  . Drug use: Yes    Types: Marijuana    Comment: past use of marijuana in '08-'09. occasional eats brownies w/ marijuana    . Sexual activity: Not Currently    Birth control/protection: Abstinence  Other Topics Concern  . 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   Social Determinants of Health   Financial Resource Strain:   . Difficulty of Paying Living Expenses:   Food Insecurity:   . Worried About Charity fundraiser in the Last Year:   . Arboriculturist in the Last Year:   Transportation Needs:   . Film/video editor (Medical):   Marland Kitchen Lack of Transportation (Non-Medical):   Physical Activity:   . Days of Exercise per Week:   . Minutes of Exercise per Session:   Stress:   . Feeling of Stress :   Social Connections:   . Frequency of Communication with Friends and Family:   . Frequency of Social Gatherings with Friends and Family:   . Attends Religious Services:   . Active Member of Clubs or Organizations:   . Attends Archivist Meetings:   Marland Kitchen Marital Status:    Family History  Problem Relation Age of Onset  . Drug abuse Father   . Depression Maternal Grandmother   . Anxiety disorder Maternal Grandmother   . COPD Maternal Grandmother   . Suicidality Cousin   . Depression Cousin   . Bipolar disorder Cousin   . Hypertension Mother   . Depression Mother   . Diabetes Paternal Grandfather   .  COPD Paternal Grandmother   . Depression Maternal Aunt   . Breast cancer Maternal Aunt   . Depression Maternal Aunt   . Heart disease Neg Hx    Allergies  Allergen Reactions  . Kiwi Extract Swelling   Prior to Admission medications   Medication Sig Start Date End Date Taking? Authorizing Provider  ARIPiprazole (ABILIFY) 2 MG tablet Take 2 mg by mouth daily. 10/05/19   [provider]  beclomethasone (QVAR) 80 MCG/ACT inhaler Inhale 2 puffs into the lungs daily as needed (For [facility]).     [provider]  cyclobenzaprine (FLEXERIL) 5 MG tablet Take 1 tablet (5 mg total) by mouth 3 (three) times daily as needed for muscle spasms. 07/16/19   Gifford Shave, MD  lamoTRIgine (LAMICTAL) 200 MG tablet Take 1 tablet (200 mg total) by mouth daily. Take 1  tablet (25 mg) in the morning & 2 tablets (50 mg) in the evening: For mood stabilization Patient taking differently: Take 200 mg by mouth at bedtime. Take 1 tablet (25 mg) in the morning & 2 tablets (50 mg) in the evening: For mood stabilizatio 11/24/17   Nicolette Bang, DO  levothyroxine (SYNTHROID) 112 MCG tablet TAKE 1 TABLET BY MOUTH DAILY 07/31/19   Meccariello, Bernita Raisin, DO  montelukast (SINGULAIR) 10 MG tablet Take 1 tablet (10 mg total) by mouth at bedtime. 10/29/19   Raylene Everts, MD  albuterol (PROVENTIL) (2.5 MG/3ML) 0.083% nebulizer solution Take 3 mLs (2.5 mg total) by nebulization every 6 (six) hours as needed for wheezing or shortness of breath. Patient not taking: Reported on 01/09/2019 11/14/18 05/03/19  Bufford Lope, DO     Positive ROS: Otherwise negative  All other systems have been reviewed and were otherwise negative with the exception of those mentioned in the HPI and as above.  Physical Exam: There were no vitals filed for this visit.  General: Alert, no acute distress Oral: Normal oral mucosa and tonsils Nasal: Clear nasal passages.  On fiberoptic laryngoscopy she has prominent adenoid tissue but otherwise clear nasopharynx.  Hypopharynx and larynx was clear. Neck: She has a large palpable adenopathy along the sensory chain of lymph nodes posterior to the right sternocleidomastoid muscle.  No obvious supraclavicular adenopathy anteriorly or along the jugular chain lymph nodes. Ear: Ear canal is clear with normal appearing TMs Cardiovascular: Regular rate and rhythm, no murmur.  Respiratory: Clear to auscultation Neurologic: Alert and oriented x 3   Assessment/Plan Enlarged right neck lymphadenopathy  Plan for excisional biopsy of right posterior neck node.  Melony Overly, MD 11/16/2019 6:46 PM

## 2019-11-16 NOTE — H&P (View-Only) (Signed)
PREOPERATIVE H&P  Chief Complaint: Enlarged right neck lymph node  HPI: Colleen Peck is a 39 y.o. female who presents for evaluation of enlarged right neck lymph node for possible excisional biopsy.  Patient has had a CT scan of her neck that showed an enlarging right neck lymph node where she had a group of a couple of lymph nodes in the posterior right neck.  The largest measuring approximately 1.9 cm in size.  Radiologist recommended biopsy. On examination the office patient has a fairly easily palpable lymph node in the posterior right neck just behind the sternocleidomastoid muscle in the region of the sensory chain of lymph nodes. She is taken to the operating room at this time for excisional biopsy of the posterior right neck node.  Past Medical History:  Diagnosis Date  . Abnormal pap    pt reports abnl pap many years ago.  Nl since then.  . Allergy    seasonal  . Anemia   . Asthma   . Bipolar disorder ([place])   . Depression   . Palpitations 03/12/2008  . PTSD (post-traumatic stress disorder)   . Seasonal allergies   . Thyroid disease 2009   Graves disease (pt reported resolved); hypothyriodism   Past Surgical History:  Procedure Laterality Date  . DILATION AND CURETTAGE OF UTERUS  March 2006   Social History   Socioeconomic History  . Marital status: Single    Spouse name: Not on file  . Number of children: Not on file  . Years of education: Not on file  . Highest education level: Not on file  Occupational History  . Not on file  Tobacco Use  . Smoking status: Never Smoker  . Smokeless tobacco: Never Used  Substance and Sexual Activity  . Alcohol use: Yes    Comment: occ  . Drug use: Yes    Types: Marijuana    Comment: past use of marijuana in '08-'09. occasional eats brownies w/ marijuana    . Sexual activity: Not Currently    Birth control/protection: Abstinence  Other Topics Concern  . 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   Social Determinants of Health   Financial Resource Strain:   . Difficulty of Paying Living Expenses:   Food Insecurity:   . Worried About Charity fundraiser in the Last Year:   . Arboriculturist in the Last Year:   Transportation Needs:   . Film/video editor (Medical):   Marland Kitchen Lack of Transportation (Non-Medical):   Physical Activity:   . Days of Exercise per Week:   . Minutes of Exercise per Session:   Stress:   . Feeling of Stress :   Social Connections:   . Frequency of Communication with Friends and Family:   . Frequency of Social Gatherings with Friends and Family:   . Attends Religious Services:   . Active Member of Clubs or Organizations:   . Attends Archivist Meetings:   Marland Kitchen Marital Status:    Family History  Problem Relation Age of Onset  . Drug abuse Father   . Depression Maternal Grandmother   . Anxiety disorder Maternal Grandmother   . COPD Maternal Grandmother   . Suicidality Cousin   . Depression Cousin   . Bipolar disorder Cousin   . Hypertension Mother   . Depression Mother   . Diabetes Paternal Grandfather   .  COPD Paternal Grandmother   . Depression Maternal Aunt   . Breast cancer Maternal Aunt   . Depression Maternal Aunt   . Heart disease Neg Hx    Allergies  Allergen Reactions  . Kiwi Extract Swelling   Prior to Admission medications   Medication Sig Start Date End Date Taking? Authorizing Provider  ARIPiprazole (ABILIFY) 2 MG tablet Take 2 mg by mouth daily. 10/05/19   [provider]  beclomethasone (QVAR) 80 MCG/ACT inhaler Inhale 2 puffs into the lungs daily as needed (For [facility]).     [provider]  cyclobenzaprine (FLEXERIL) 5 MG tablet Take 1 tablet (5 mg total) by mouth 3 (three) times daily as needed for muscle spasms. 07/16/19   Gifford Shave, MD  lamoTRIgine (LAMICTAL) 200 MG tablet Take 1 tablet (200 mg total) by mouth daily. Take 1  tablet (25 mg) in the morning & 2 tablets (50 mg) in the evening: For mood stabilization Patient taking differently: Take 200 mg by mouth at bedtime. Take 1 tablet (25 mg) in the morning & 2 tablets (50 mg) in the evening: For mood stabilizatio 11/24/17   Nicolette Bang, DO  levothyroxine (SYNTHROID) 112 MCG tablet TAKE 1 TABLET BY MOUTH DAILY 07/31/19   Meccariello, Bernita Raisin, DO  montelukast (SINGULAIR) 10 MG tablet Take 1 tablet (10 mg total) by mouth at bedtime. 10/29/19   Raylene Everts, MD  albuterol (PROVENTIL) (2.5 MG/3ML) 0.083% nebulizer solution Take 3 mLs (2.5 mg total) by nebulization every 6 (six) hours as needed for wheezing or shortness of breath. Patient not taking: Reported on 01/09/2019 11/14/18 05/03/19  Bufford Lope, DO     Positive ROS: Otherwise negative  All other systems have been reviewed and were otherwise negative with the exception of those mentioned in the HPI and as above.  Physical Exam: There were no vitals filed for this visit.  General: Alert, no acute distress Oral: Normal oral mucosa and tonsils Nasal: Clear nasal passages.  On fiberoptic laryngoscopy she has prominent adenoid tissue but otherwise clear nasopharynx.  Hypopharynx and larynx was clear. Neck: She has a large palpable adenopathy along the sensory chain of lymph nodes posterior to the right sternocleidomastoid muscle.  No obvious supraclavicular adenopathy anteriorly or along the jugular chain lymph nodes. Ear: Ear canal is clear with normal appearing TMs Cardiovascular: Regular rate and rhythm, no murmur.  Respiratory: Clear to auscultation Neurologic: Alert and oriented x 3   Assessment/Plan Enlarged right neck lymphadenopathy  Plan for excisional biopsy of right posterior neck node.  Melony Overly, MD 11/16/2019 6:46 PM

## 2019-11-20 ENCOUNTER — Other Ambulatory Visit (HOSPITAL_COMMUNITY)
Admission: RE | Admit: 2019-11-20 | Discharge: 2019-11-20 | Disposition: A | Discharge status: Home / Self Care | Payer: Medicare Other | Source: Ambulatory Visit | Attending: Radiology | Admitting: Radiology

## 2019-11-20 DIAGNOSIS — Z20822 Contact with and (suspected) exposure to covid-19: Secondary | ICD-10-CM | POA: Insufficient documentation

## 2019-11-20 DIAGNOSIS — Z01812 Encounter for preprocedural laboratory examination: Secondary | ICD-10-CM | POA: Diagnosis present

## 2019-11-20 LAB — SARS CORONAVIRUS 2 (TAT 6-24 HRS): SARS Coronavirus 2: NEGATIVE

## 2019-11-22 DIAGNOSIS — F319 Bipolar disorder, unspecified: Secondary | ICD-10-CM | POA: Diagnosis not present

## 2019-11-22 NOTE — Progress Notes (Addendum)
Left message for pt to come pick up soap.          Enhanced Recovery after Surgery for Orthopedics Enhanced Recovery after Surgery is a protocol used to improve the stress on your body and your recovery after surgery.  Patient Instructions  . The night before surgery:  o No food after midnight. ONLY clear liquids after midnight  . The day of surgery (if you do NOT have diabetes):  o Drink ONE (1) Pre-Surgery Clear Ensure as directed.   o This drink was given to you during your hospital  pre-op appointment visit. o The pre-op nurse will instruct you on the time to drink the  Pre-Surgery Ensure depending on your surgery time. o Finish the drink at the designated time by the pre-op nurse.  o Nothing else to drink after completing the  Pre-Surgery Clear Ensure.  . The day of surgery (if you have diabetes): o Drink ONE (1) Gatorade 2 (G2) as directed. o This drink was given to you during your hospital  pre-op appointment visit.  o The pre-op nurse will instruct you on the time to drink the   Gatorade 2 (G2) depending on your surgery time. o Color of the Gatorade may vary. Red is not allowed. o Nothing else to drink after completing the  Gatorade 2 (G2).         If you have questions, please contact your surgeon's office.

## 2019-11-22 NOTE — Anesthesia Preprocedure Evaluation (Addendum)
Anesthesia Evaluation  Patient identified by MRN, date of birth, ID band Patient awake    Reviewed: Allergy & Precautions, NPO status , Patient's Chart, lab work & pertinent test results  Airway Mallampati: II  TM Distance: >3 FB Neck ROM: Full    Dental no notable dental hx. (+) Teeth Intact, Dental Advisory Given   Pulmonary asthma ,    Pulmonary exam normal breath sounds clear to auscultation       Cardiovascular Exercise Tolerance: Good Normal cardiovascular exam Rhythm:Regular Rate:Normal     Neuro/Psych Depression Bipolar Disorder    GI/Hepatic Neg liver ROS,   Endo/Other  Hypothyroidism   Renal/GU negative Renal ROS  negative genitourinary   Musculoskeletal negative musculoskeletal ROS (+)   Abdominal (+) + obese,   Peds  Hematology negative hematology ROS (+) anemia ,   Anesthesia Other Findings   Reproductive/Obstetrics                            Anesthesia Physical Anesthesia Plan  ASA: II  Anesthesia Plan: MAC   Post-op Pain Management:    Induction:   PONV Risk Score and Plan: 3 and Treatment may vary due to age or medical condition, Ondansetron and Midazolam  Airway Management Planned: Nasal Cannula and Natural Airway  Additional Equipment: None  Intra-op Plan:   Post-operative Plan:   Informed Consent: I have reviewed the patients History and Physical, chart, labs and discussed the procedure including the risks, benefits and alternatives for the proposed anesthesia with the patient or authorized representative who has indicated his/her understanding and acceptance.     Dental advisory given  Plan Discussed with: CRNA  Anesthesia Plan Comments:        Anesthesia Quick Evaluation

## 2019-11-23 ENCOUNTER — Other Ambulatory Visit: Payer: Self-pay

## 2019-11-23 ENCOUNTER — Ambulatory Visit (HOSPITAL_BASED_OUTPATIENT_CLINIC_OR_DEPARTMENT_OTHER): Payer: Federal, State, Local not specified - PPO | Admitting: Anesthesiology

## 2019-11-23 ENCOUNTER — Encounter (HOSPITAL_BASED_OUTPATIENT_CLINIC_OR_DEPARTMENT_OTHER)
Admission: RE | Disposition: A | Discharge status: Home / Self Care | Payer: Self-pay | Source: Home / Self Care | Attending: Otolaryngology

## 2019-11-23 ENCOUNTER — Ambulatory Visit (HOSPITAL_BASED_OUTPATIENT_CLINIC_OR_DEPARTMENT_OTHER)
Admission: RE | Admit: 2019-11-23 | Discharge: 2019-11-23 | Disposition: A | Discharge status: Home / Self Care | Payer: Federal, State, Local not specified - PPO | Attending: Otolaryngology | Admitting: Otolaryngology

## 2019-11-23 ENCOUNTER — Encounter (HOSPITAL_BASED_OUTPATIENT_CLINIC_OR_DEPARTMENT_OTHER): Payer: Self-pay | Admitting: Otolaryngology

## 2019-11-23 DIAGNOSIS — Z79890 Hormone replacement therapy (postmenopausal): Secondary | ICD-10-CM | POA: Insufficient documentation

## 2019-11-23 DIAGNOSIS — R591 Generalized enlarged lymph nodes: Secondary | ICD-10-CM | POA: Diagnosis not present

## 2019-11-23 DIAGNOSIS — F4310 Post-traumatic stress disorder, unspecified: Secondary | ICD-10-CM | POA: Insufficient documentation

## 2019-11-23 DIAGNOSIS — F319 Bipolar disorder, unspecified: Secondary | ICD-10-CM | POA: Insufficient documentation

## 2019-11-23 DIAGNOSIS — Z7951 Long term (current) use of inhaled steroids: Secondary | ICD-10-CM | POA: Insufficient documentation

## 2019-11-23 DIAGNOSIS — D638 Anemia in other chronic diseases classified elsewhere: Secondary | ICD-10-CM | POA: Diagnosis not present

## 2019-11-23 DIAGNOSIS — E039 Hypothyroidism, unspecified: Secondary | ICD-10-CM | POA: Diagnosis not present

## 2019-11-23 DIAGNOSIS — Z8249 Family history of ischemic heart disease and other diseases of the circulatory system: Secondary | ICD-10-CM | POA: Diagnosis not present

## 2019-11-23 DIAGNOSIS — J45909 Unspecified asthma, uncomplicated: Secondary | ICD-10-CM | POA: Diagnosis not present

## 2019-11-23 DIAGNOSIS — L040 Acute lymphadenitis of face, head and neck: Secondary | ICD-10-CM | POA: Diagnosis not present

## 2019-11-23 DIAGNOSIS — Z79899 Other long term (current) drug therapy: Secondary | ICD-10-CM | POA: Diagnosis not present

## 2019-11-23 DIAGNOSIS — Z6838 Body mass index (BMI) 38.0-38.9, adult: Secondary | ICD-10-CM | POA: Insufficient documentation

## 2019-11-23 DIAGNOSIS — E669 Obesity, unspecified: Secondary | ICD-10-CM | POA: Insufficient documentation

## 2019-11-23 DIAGNOSIS — R599 Enlarged lymph nodes, unspecified: Secondary | ICD-10-CM | POA: Diagnosis not present

## 2019-11-23 DIAGNOSIS — R590 Localized enlarged lymph nodes: Secondary | ICD-10-CM | POA: Diagnosis not present

## 2019-11-23 HISTORY — PX: LYMPH NODE BIOPSY: SHX201

## 2019-11-23 LAB — POCT PREGNANCY, URINE: Preg Test, Ur: NEGATIVE

## 2019-11-23 SURGERY — LYMPH NODE BIOPSY
Anesthesia: Monitor Anesthesia Care | Laterality: Right

## 2019-11-23 MED ORDER — CEFAZOLIN SODIUM-DEXTROSE 2-4 GM/100ML-% IV SOLN: 2.0000 g | INTRAVENOUS | Status: DC

## 2019-11-23 MED ORDER — FENTANYL CITRATE (PF) 100 MCG/2ML IJ SOLN: 50.0000 ug | INTRAMUSCULAR | Status: DC | PRN

## 2019-11-23 MED ORDER — CHLORHEXIDINE GLUCONATE CLOTH 2 % EX PADS: 6.0000 | MEDICATED_PAD | Freq: Once | CUTANEOUS | Status: DC

## 2019-11-23 MED ORDER — ACETAMINOPHEN 10 MG/ML IV SOLN: 1000.0000 mg | Freq: Once | INTRAVENOUS | Status: DC | PRN

## 2019-11-23 MED ORDER — HYDROCODONE-ACETAMINOPHEN 7.5-325 MG PO TABS: 1.0000 | ORAL_TABLET | Freq: Once | ORAL | Status: DC | PRN

## 2019-11-23 MED ORDER — PROPOFOL 10 MG/ML IV BOLUS
INTRAVENOUS | Status: DC | PRN
  Administered 2019-11-23: 20 mg via INTRAVENOUS
  Administered 2019-11-23 (×2): 10 mg via INTRAVENOUS

## 2019-11-23 MED ORDER — LACTATED RINGERS IV SOLN: INTRAVENOUS | Status: DC | PRN

## 2019-11-23 MED ORDER — PROPOFOL 500 MG/50ML IV EMUL
INTRAVENOUS | Status: AC
  Filled 2019-11-23: qty 50

## 2019-11-23 MED ORDER — LIDOCAINE 2% (20 MG/ML) 5 ML SYRINGE
INTRAMUSCULAR | Status: DC | PRN
  Administered 2019-11-23: 40 mg via INTRAVENOUS

## 2019-11-23 MED ORDER — MIDAZOLAM HCL 2 MG/2ML IJ SOLN
INTRAMUSCULAR | Status: AC
  Filled 2019-11-23: qty 2

## 2019-11-23 MED ORDER — MIDAZOLAM HCL 5 MG/5ML IJ SOLN
INTRAMUSCULAR | Status: DC | PRN
  Administered 2019-11-23: 2 mg via INTRAVENOUS

## 2019-11-23 MED ORDER — ONDANSETRON HCL 4 MG/2ML IJ SOLN
INTRAMUSCULAR | Status: AC
  Filled 2019-11-23: qty 2

## 2019-11-23 MED ORDER — CEFAZOLIN SODIUM-DEXTROSE 2-3 GM-%(50ML) IV SOLR
INTRAVENOUS | Status: DC | PRN
Start: 2019-11-23 — End: 2019-11-23
  Administered 2019-11-23: 2 g via INTRAVENOUS

## 2019-11-23 MED ORDER — FENTANYL CITRATE (PF) 100 MCG/2ML IJ SOLN
INTRAMUSCULAR | Status: AC
  Filled 2019-11-23: qty 2

## 2019-11-23 MED ORDER — LACTATED RINGERS IV SOLN: INTRAVENOUS | Status: DC

## 2019-11-23 MED ORDER — DEXMEDETOMIDINE HCL IN NACL 200 MCG/50ML IV SOLN
INTRAVENOUS | Status: AC
  Filled 2019-11-23: qty 100

## 2019-11-23 MED ORDER — DEXAMETHASONE SODIUM PHOSPHATE 10 MG/ML IJ SOLN
INTRAMUSCULAR | Status: AC
  Filled 2019-11-23: qty 1

## 2019-11-23 MED ORDER — LIDOCAINE 2% (20 MG/ML) 5 ML SYRINGE
INTRAMUSCULAR | Status: AC
  Filled 2019-11-23: qty 5

## 2019-11-23 MED ORDER — MEPERIDINE HCL 25 MG/ML IJ SOLN: 6.2500 mg | INTRAMUSCULAR | Status: DC | PRN

## 2019-11-23 MED ORDER — LIDOCAINE-EPINEPHRINE 1 %-1:100000 IJ SOLN
INTRAMUSCULAR | Status: DC | PRN
  Administered 2019-11-23: 6 mL

## 2019-11-23 MED ORDER — ONDANSETRON HCL 4 MG/2ML IJ SOLN
INTRAMUSCULAR | Status: DC | PRN
  Administered 2019-11-23: 4 mg via INTRAVENOUS

## 2019-11-23 MED ORDER — FENTANYL CITRATE (PF) 100 MCG/2ML IJ SOLN
INTRAMUSCULAR | Status: DC | PRN
  Administered 2019-11-23 (×2): 50 ug via INTRAVENOUS

## 2019-11-23 MED ORDER — MIDAZOLAM HCL 2 MG/2ML IJ SOLN: 1.0000 mg | INTRAMUSCULAR | Status: DC | PRN

## 2019-11-23 MED ORDER — HYDROMORPHONE HCL 1 MG/ML IJ SOLN: 0.2500 mg | INTRAMUSCULAR | Status: DC | PRN

## 2019-11-23 MED ORDER — CEFAZOLIN SODIUM-DEXTROSE 2-4 GM/100ML-% IV SOLN
INTRAVENOUS | Status: AC
  Filled 2019-11-23: qty 100

## 2019-11-23 MED ORDER — ONDANSETRON HCL 4 MG/2ML IJ SOLN: 4.0000 mg | Freq: Once | INTRAMUSCULAR | Status: DC | PRN

## 2019-11-23 MED ORDER — PROPOFOL 500 MG/50ML IV EMUL
INTRAVENOUS | Status: DC | PRN
  Administered 2019-11-23: 75 ug/kg/min via INTRAVENOUS

## 2019-11-23 MED ORDER — DEXMEDETOMIDINE HCL IN NACL 200 MCG/50ML IV SOLN
INTRAVENOUS | Status: DC | PRN
  Administered 2019-11-23 (×2): 4 ug via INTRAVENOUS
  Administered 2019-11-23: 8 ug via INTRAVENOUS
  Administered 2019-11-23 (×2): 4 ug via INTRAVENOUS
  Administered 2019-11-23 (×2): 8 ug via INTRAVENOUS

## 2019-11-23 SURGICAL SUPPLY — 37 items
ADH SKN CLS APL DERMABOND .7 (GAUZE/BANDAGES/DRESSINGS) ×1
BLADE SURG 15 STRL LF DISP TIS (BLADE) ×1 IMPLANT
BLADE SURG 15 STRL SS (BLADE) ×3
CORD BIPOLAR FORCEPS 12FT (ELECTRODE) ×2 IMPLANT
COVER BACK TABLE 60X90IN (DRAPES) ×3 IMPLANT
COVER MAYO STAND STRL (DRAPES) ×3 IMPLANT
DERMABOND ADVANCED (GAUZE/BANDAGES/DRESSINGS) ×2
DERMABOND ADVANCED .7 DNX12 (GAUZE/BANDAGES/DRESSINGS) IMPLANT
DRAPE U-SHAPE 76X120 STRL (DRAPES) ×3 IMPLANT
ELECT COATED BLADE 2.86 ST (ELECTRODE) ×3 IMPLANT
ELECT REM PT RETURN 9FT ADLT (ELECTROSURGICAL) ×3
ELECTRODE REM PT RTRN 9FT ADLT (ELECTROSURGICAL) ×1 IMPLANT
FORCEPS BIPOLAR SPETZLER 8 1.0 (NEUROSURGERY SUPPLIES) ×2 IMPLANT
GLOVE SS BIOGEL STRL SZ 7.5 (GLOVE) ×1 IMPLANT
GLOVE SUPERSENSE BIOGEL SZ 7.5 (GLOVE) ×2
GOWN STRL REUS W/ TWL LRG LVL3 (GOWN DISPOSABLE) ×1 IMPLANT
GOWN STRL REUS W/ TWL XL LVL3 (GOWN DISPOSABLE) ×1 IMPLANT
GOWN STRL REUS W/TWL LRG LVL3 (GOWN DISPOSABLE) ×3
GOWN STRL REUS W/TWL XL LVL3 (GOWN DISPOSABLE) ×3
NDL HYPO 25X1 1.5 SAFETY (NEEDLE) ×1 IMPLANT
NEEDLE HYPO 25X1 1.5 SAFETY (NEEDLE) ×3 IMPLANT
NS IRRIG 1000ML POUR BTL (IV SOLUTION) ×3 IMPLANT
PENCIL SMOKE EVACUATOR (MISCELLANEOUS) ×3 IMPLANT
SET BASIN DAY SURGERY F.S. (CUSTOM PROCEDURE TRAY) ×3 IMPLANT
SUT CHROMIC 3 0 PS 2 (SUTURE) ×3 IMPLANT
SUT ETHILON 5 0 P 3 18 (SUTURE) ×3
SUT NYLON ETHILON 5-0 P-3 1X18 (SUTURE) ×1 IMPLANT
SUT SILK 3 0 TIES 17X18 (SUTURE) ×3
SUT SILK 3-0 18XBRD TIE BLK (SUTURE) ×1 IMPLANT
SUT VIC AB 5-0 P-3 18X BRD (SUTURE) IMPLANT
SUT VIC AB 5-0 P3 18 (SUTURE) ×3
SYR BULB EAR ULCER 3OZ GRN STR (SYRINGE) ×3 IMPLANT
SYR CONTROL 10ML LL (SYRINGE) ×3 IMPLANT
TOWEL GREEN STERILE FF (TOWEL DISPOSABLE) ×6 IMPLANT
TRAY DSU PREP LF (CUSTOM PROCEDURE TRAY) ×3 IMPLANT
TUBE CONNECTING 20'X1/4 (TUBING) ×1
TUBE CONNECTING 20X1/4 (TUBING) ×2 IMPLANT

## 2019-11-23 NOTE — Anesthesia Postprocedure Evaluation (Signed)
Anesthesia Post Note  Patient: Colleen Peck  Procedure(s) Performed: EXCISIONAL BIOPSY OF RIGHT NECK NODE (Right )     Patient location during evaluation: PACU Anesthesia Type: MAC Level of consciousness: awake and alert Pain management: pain level controlled Vital Signs Assessment: post-procedure vital signs reviewed and stable Respiratory status: spontaneous breathing, nonlabored ventilation, respiratory function stable and patient connected to nasal cannula oxygen Cardiovascular status: stable and blood pressure returned to baseline Postop Assessment: no apparent nausea or vomiting Anesthetic complications: no    Last Vitals:  Vitals:   11/23/19 0938 11/23/19 1015  BP: (!) 99/59 93/69  Pulse: (!) 52 (!) 57  Resp: 12 14  Temp:  36.4 C  SpO2: 100% 99%    Last Pain:  Vitals:   11/23/19 1015  TempSrc:   PainSc: 0-No pain                 Barnet Glasgow

## 2019-11-23 NOTE — Brief Op Note (Signed)
11/23/2019  8:58 AM  PATIENT:  Colleen Peck  39 y.o. female  PRE-OPERATIVE DIAGNOSIS:  LYMPHADENOPATHY  POST-OPERATIVE DIAGNOSIS:  LYMPHADENOPATHY  PROCEDURE:  Procedure(s): EXCISIONAL BIOPSY OF RIGHT NECK NODE (Right)  SURGEON:  Surgeon(s) and Role:    Rozetta Nunnery, MD - Primary  PHYSICIAN ASSISTANT:   ASSISTANTS: none   ANESTHESIA:   IV sedation  EBL:  2 mL   BLOOD ADMINISTERED:none  DRAINS: none   LOCAL MEDICATIONS USED:  XYLOCAINE with EPI 6 cc  SPECIMEN:  Source of Specimen:  right posterior neck  DISPOSITION OF SPECIMEN:  PATHOLOGY  COUNTS:  YES  TOURNIQUET:  * No tourniquets in log *  DICTATION: .Other Dictation: Dictation Number [id number]  PLAN OF CARE: Discharge to home after PACU  PATIENT DISPOSITION:  PACU - hemodynamically stable.   Delay start of Pharmacological VTE agent (>24hrs) due to surgical blood loss or risk of bleeding: yes

## 2019-11-23 NOTE — Anesthesia Postprocedure Evaluation (Signed)
Anesthesia Post Note  Patient: Colleen Peck  Procedure(s) Performed: EXCISIONAL BIOPSY OF RIGHT NECK NODE (Right )     Anesthesia Post Evaluation  Last Vitals:  Vitals:   11/23/19 0938 11/23/19 1015  BP: (!) 99/59 93/69  Pulse: (!) 52 (!) 57  Resp: 12 14  Temp:  36.4 C  SpO2: 100% 99%    Last Pain:  Vitals:   11/23/19 1015  TempSrc:   PainSc: 0-No pain                 Barnet Glasgow

## 2019-11-23 NOTE — Transfer of Care (Signed)
Immediate Anesthesia Transfer of Care Note  Patient: Colleen Peck  Procedure(s) Performed: EXCISIONAL BIOPSY OF RIGHT NECK NODE (Right )  Patient Location: PACU  Anesthesia Type:MAC  Level of Consciousness: awake, alert  and oriented  Airway & Oxygen Therapy: Patient Spontanous Breathing and Patient connected to face mask oxygen  Post-op Assessment: Report given to RN and Post -op Vital signs reviewed and stable  Post vital signs: Reviewed and stable  Last Vitals:  Vitals Value Taken Time  BP 105/78   Temp    Pulse 51 11/23/19 0856  Resp 16 11/23/19 0856  SpO2 100 % 11/23/19 0856  Vitals shown include unvalidated device data.  Last Pain:  Vitals:   11/23/19 0651  TempSrc: Temporal  PainSc: 7       Patients Stated Pain Goal: 5 (22/63/33 5456)  Complications: No apparent anesthesia complications

## 2019-11-23 NOTE — Interval H&P Note (Signed)
History and Physical Interval Note:  11/23/2019 7:25 AM  Colleen Peck  has presented today for surgery, with the diagnosis of LYMPHADENOPATHY.  The various methods of treatment have been discussed with the patient and family. After consideration of risks, benefits and other options for treatment, the patient has consented to  Procedure(s): EXCISIONAL BIOPSY OF RIGHT NECK NODE (Right) as a surgical intervention.  The patient's history has been reviewed, patient examined, no change in status, stable for surgery.  I have reviewed the patient's chart and labs.  Questions were answered to the patient's satisfaction.     Melony Overly

## 2019-11-23 NOTE — Discharge Instructions (Signed)
  Post Anesthesia Home Care Instructions  Activity: Get plenty of rest for the remainder of the day. A responsible individual must stay with you for 24 hours following the procedure.  For the next 24 hours, DO NOT: -Drive a car -Paediatric nurse -Drink alcoholic beverages -Take any medication unless instructed by your physician -Make any legal decisions or sign important papers.  Meals: Start with liquid foods such as gelatin or soup. Progress to regular foods as tolerated. Avoid greasy, spicy, heavy foods. If nausea and/or vomiting occur, drink only clear liquids until the nausea and/or vomiting subsides. Call your physician if vomiting continues.  Special Instructions/Symptoms: Your throat may feel dry or sore from the anesthesia or the breathing tube placed in your throat during surgery. If this causes discomfort, gargle with warm salt water. The discomfort should disappear within 24 hours.   Keep incision site dry for 24 hrs Tylenol or ibuprofen prn pain Return to office next Friday in 1 week at 4:30  Call office if any problems or questions    [phone]

## 2019-11-23 NOTE — Op Note (Signed)
NAMECARLEE, KOWALESKI MEDICAL RECORD [id number] ACCOUNT [id number] DATE OF BIRTH:01-02-1981 FACILITY: [facility] LOCATION: [facility] PHYSICIAN:Chrissi Crow Lincoln Maxin, MD  OPERATIVE REPORT  DATE OF PROCEDURE:  11/23/2019  PREOPERATIVE DIAGNOSIS:  Right neck lymphadenopathy.  POSTOPERATIVE DIAGNOSIS:  Right neck lymphadenopathy.  OPERATION PERFORMED:  Excisional biopsy of deep right neck posterior cervical lymphadenopathy.  SURGEON:  Melony Overly, MD  ANESTHESIA:  IV sedation.  ESTIMATED BLOOD LOSS:  Minimal.  COMPLICATIONS:  None.  BRIEF CLINICAL NOTE:  Chriss is a 39 year old female who has had enlarged right neck lymphadenopathy for several months now.  She underwent a CT scan several weeks ago that showed a large group of lymph nodes in the right lower neck posteriorly with a  large node measuring approximately 17 x 19 mm in size and a biopsy was recommended.  She has fairly easily mobile palpable lymphadenopathy just posterior sternocleidomastoid muscle on the right posterior along the sensory chain of lymph nodes.  She is  taken to the operating room at this time for excisional biopsy.  DESCRIPTION OF PROCEDURE:  The patient received 2 grams Ancef IV preoperatively.  The lymphadenopathy area was marked out and prepped with Betadine solution.  Area was draped.  The patient received approximately 6 mL of Xylocaine with epinephrine for  local anesthetic and hemostasis.  A horizontal incision was made directly over the palpable lymph node.  Dissection was carried down through the subcutaneous tissue and platysma muscle.  The accessory nerve was identified in the posterior portion of the  incision site and note was just anterior to the accessory nerve.  Accessory nerve was protected throughout the procedure.  The lymph node was dissected out bluntly as well sharply and bipolar cautery was used for hemostasis.  Lymph node was removed and  sent to pathology in saline for lymphoma  workup.  Hemostasis was obtained with bipolar cautery.  The wound was irrigated and closed with 3-0 chromic suture subcutaneously, 5-0 Vicryl to reapproximate the skin edges followed by Dermabond.  DISPOSITION:  The patient is discharged home later this morning.  She was instructed to keep the incision dry.  She will follow up in my office in 1 week for recheck and review pathology.  CN/NUANCE  D:11/23/2019 T:11/23/2019 JOB:[id number]

## 2019-11-25 ENCOUNTER — Encounter (INDEPENDENT_AMBULATORY_CARE_PROVIDER_SITE_OTHER): Payer: Self-pay

## 2019-11-27 ENCOUNTER — Encounter: Payer: Self-pay | Admitting: *Deleted

## 2019-11-27 ENCOUNTER — Telehealth: Payer: Self-pay | Admitting: *Deleted

## 2019-11-27 ENCOUNTER — Other Ambulatory Visit: Payer: Self-pay | Admitting: Family Medicine

## 2019-11-27 DIAGNOSIS — J45909 Unspecified asthma, uncomplicated: Secondary | ICD-10-CM

## 2019-11-27 MED ORDER — ALBUTEROL SULFATE HFA 108 (90 BASE) MCG/ACT IN AERS: 2.0000 | INHALATION_SPRAY | RESPIRATORY_TRACT | 1 refills | Status: DC | PRN

## 2019-11-27 NOTE — Telephone Encounter (Signed)
Rx sent 

## 2019-11-27 NOTE — Telephone Encounter (Signed)
Received fax requesting refill on albuterol HFA INH and the one on current med list was from historical provider.Colleen Peck, CMA

## 2019-11-27 NOTE — Progress Notes (Signed)
Requesting albuterol inhaler refill, has history of asthma

## 2019-11-28 LAB — SURGICAL PATHOLOGY

## 2019-11-29 DIAGNOSIS — F319 Bipolar disorder, unspecified: Secondary | ICD-10-CM | POA: Diagnosis not present

## 2019-11-30 ENCOUNTER — Ambulatory Visit (INDEPENDENT_AMBULATORY_CARE_PROVIDER_SITE_OTHER): Payer: Medicare Other | Admitting: Otolaryngology

## 2019-11-30 ENCOUNTER — Other Ambulatory Visit: Payer: Self-pay

## 2019-11-30 VITALS — Temp 97.7°F

## 2019-11-30 DIAGNOSIS — Z4889 Encounter for other specified surgical aftercare: Secondary | ICD-10-CM

## 2019-11-30 NOTE — Progress Notes (Signed)
HPI: Colleen Peck is a 39 y.o. female who presents 7 days s/p excisional biopsy of a right neck node.  Final pathology report revealed findings consistent with reactive lymphadenopathy possibly from infectious origin or autoimmune disease such as lupus.  There is a small area of necrosis within the lymph node but otherwise was reactive.  She is doing well with minimal discomfort..   Past Medical History:  Diagnosis Date  . Abnormal pap    pt reports abnl pap many years ago.  Nl since then.  . Allergy    seasonal  . Anemia   . Asthma   . Bipolar disorder ([place])   . Depression   . Palpitations 03/12/2008  . PTSD (post-traumatic stress disorder)   . Seasonal allergies   . Thyroid disease 2009   Graves disease (pt reported resolved); hypothyriodism   Past Surgical History:  Procedure Laterality Date  . DILATION AND CURETTAGE OF UTERUS  March 2006  . LYMPH NODE BIOPSY Right 11/23/2019   Procedure: EXCISIONAL BIOPSY OF RIGHT NECK NODE;  Surgeon: Rozetta Nunnery, MD;  Location: [facility];  Service: ENT;  Laterality: Right;   Social History   Socioeconomic History  . Marital status: Single    Spouse name: Not on file  . Number of children: Not on file  . Years of education: Not on file  . Highest education level: Not on file  Occupational History  . Not on file  Tobacco Use  . Smoking status: Never Smoker  . Smokeless tobacco: Never Used  Substance and Sexual Activity  . Alcohol use: Yes    Comment: occ  . Drug use: Yes    Types: Marijuana    Comment: past use of marijuana in '08-'09. occasional eats brownies w/ marijuana    . Sexual activity: Not Currently    Birth control/protection: Abstinence  Other Topics Concern  . 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   Social Determinants of Health   Financial Resource Strain:   . Difficulty of Paying Living Expenses:    Food Insecurity:   . Worried About Charity fundraiser in the Last Year:   . Arboriculturist in the Last Year:   Transportation Needs:   . Film/video editor (Medical):   Marland Kitchen Lack of Transportation (Non-Medical):   Physical Activity:   . Days of Exercise per Week:   . Minutes of Exercise per Session:   Stress:   . Feeling of Stress :   Social Connections:   . Frequency of Communication with Friends and Family:   . Frequency of Social Gatherings with Friends and Family:   . Attends Religious Services:   . Active Member of Clubs or Organizations:   . Attends Archivist Meetings:   Marland Kitchen Marital Status:    Family History  Problem Relation Age of Onset  . Drug abuse Father   . Depression Maternal Grandmother   . Anxiety disorder Maternal Grandmother   . COPD Maternal Grandmother   . Suicidality Cousin   . Depression Cousin   . Bipolar disorder Cousin   . Hypertension Mother   . Depression Mother   . Diabetes Paternal Grandfather   . COPD Paternal Grandmother   . Depression Maternal Aunt   . Breast cancer Maternal Aunt   . Depression Maternal Aunt   . Heart disease Neg Hx  Allergies  Allergen Reactions  . Kiwi Extract Swelling   Prior to Admission medications   Medication Sig Start Date End Date Taking? Authorizing Provider  albuterol (ACCUNEB) 1.25 MG/3ML nebulizer solution Take 1 ampule by nebulization every 6 (six) hours as needed for wheezing.   Yes [provider]  albuterol (VENTOLIN HFA) 108 (90 Base) MCG/ACT inhaler Inhale 2 puffs into the lungs every 4 (four) hours as needed for wheezing or shortness of breath. 11/27/19  Yes Meccariello, Bernita Raisin, DO  APPLE CIDER VINEGAR PO Take by mouth.   Yes [provider]  ARIPiprazole (ABILIFY) 2 MG tablet Take 2 mg by mouth daily. 10/05/19  Yes [provider]  beclomethasone (QVAR) 80 MCG/ACT inhaler Inhale 2 puffs into the lungs daily as needed (For [facility]).    Yes [provider]   cyclobenzaprine (FLEXERIL) 5 MG tablet Take 1 tablet (5 mg total) by mouth 3 (three) times daily as needed for muscle spasms. 07/16/19  Yes Gifford Shave, MD  Emollient (COLLAGEN EX) Apply topically.   Yes [provider]  Ginger, Zingiber officinalis, (GINGER EXTRACT PO) Take by mouth.   Yes [provider]  lamoTRIgine (LAMICTAL) 200 MG tablet Take 1 tablet (200 mg total) by mouth daily. Take 1 tablet (25 mg) in the morning & 2 tablets (50 mg) in the evening: For mood stabilization Patient taking differently: Take 200 mg by mouth at bedtime. Take 1 tablet (25 mg) in the morning & 2 tablets (50 mg) in the evening: For mood stabilizatio 11/24/17  Yes Nicolette Bang, DO  levothyroxine (SYNTHROID) 112 MCG tablet TAKE 1 TABLET BY MOUTH DAILY 07/31/19  Yes Meccariello, Bernita Raisin, DO  montelukast (SINGULAIR) 10 MG tablet Take 1 tablet (10 mg total) by mouth at bedtime. 10/29/19  Yes Raylene Everts, MD  OVER THE COUNTER MEDICATION GOLI vitamin   Yes [provider]  Prenatal Vit-Fe Fumarate-FA (PRENATAL MULTIVITAMIN) TABS tablet Take 1 tablet by mouth daily at 12 noon.   Yes [provider]     Physical Exam: Excision site is healing nicely with no signs of infection.   Assessment: Status post excisional biopsy of right neck node.  This was consistent with reactive lymphadenopathy.  Plan: She will follow-up as needed.   Radene Journey, MD

## 2019-12-06 DIAGNOSIS — F319 Bipolar disorder, unspecified: Secondary | ICD-10-CM | POA: Diagnosis not present

## 2019-12-20 ENCOUNTER — Other Ambulatory Visit: Payer: Self-pay

## 2019-12-23 MED ORDER — ALBUTEROL SULFATE 1.25 MG/3ML IN NEBU: 1.0000 | INHALATION_SOLUTION | Freq: Four times a day (QID) | RESPIRATORY_TRACT | 1 refills | Status: DC | PRN

## 2019-12-25 DIAGNOSIS — M4726 Other spondylosis with radiculopathy, lumbar region: Secondary | ICD-10-CM | POA: Diagnosis not present

## 2019-12-25 DIAGNOSIS — M5136 Other intervertebral disc degeneration, lumbar region: Secondary | ICD-10-CM | POA: Diagnosis not present

## 2019-12-25 DIAGNOSIS — M5126 Other intervertebral disc displacement, lumbar region: Secondary | ICD-10-CM | POA: Diagnosis not present

## 2019-12-25 DIAGNOSIS — M549 Dorsalgia, unspecified: Secondary | ICD-10-CM | POA: Diagnosis not present

## 2020-01-07 DIAGNOSIS — M5126 Other intervertebral disc displacement, lumbar region: Secondary | ICD-10-CM | POA: Diagnosis not present

## 2020-01-22 DIAGNOSIS — M5126 Other intervertebral disc displacement, lumbar region: Secondary | ICD-10-CM | POA: Diagnosis not present

## 2020-01-22 DIAGNOSIS — M5416 Radiculopathy, lumbar region: Secondary | ICD-10-CM | POA: Diagnosis not present

## 2020-01-22 DIAGNOSIS — Z6839 Body mass index (BMI) 39.0-39.9, adult: Secondary | ICD-10-CM | POA: Diagnosis not present

## 2020-02-03 ENCOUNTER — Telehealth: Payer: Self-pay | Admitting: Family Medicine

## 2020-02-03 NOTE — Telephone Encounter (Signed)
**  After-hours emergency line call**  Ms. Colleen Peck is a 39 year old female with a history of hypothyroidism, schizoaffective bipolar type/PTSD, and anemia calling into the after-hours line to schedule an appointment.  She reports she fell at work last night onto her knee.  Floor is concrete.  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  She is trying to qualify for workers compensation and was told she need to follow-up ASAP for evaluation, reports she does not want to go to the ED and would rather follow-up in the clinic.  States she is having some burning in her knee, but otherwise doing okay without any weakness.  Scheduled with Dr. Ouida Sills at 1:50 PM tomorrow, 7/26.  Will route to PCP as well.  Patriciaann Clan, DO

## 2020-02-04 ENCOUNTER — Ambulatory Visit (INDEPENDENT_AMBULATORY_CARE_PROVIDER_SITE_OTHER): Payer: Medicare Other | Admitting: Student in an Organized Health Care Education/Training Program

## 2020-02-04 ENCOUNTER — Other Ambulatory Visit: Payer: Self-pay

## 2020-02-04 ENCOUNTER — Encounter: Payer: Self-pay | Admitting: Student in an Organized Health Care Education/Training Program

## 2020-02-04 DIAGNOSIS — M25562 Pain in left knee: Secondary | ICD-10-CM

## 2020-02-04 DIAGNOSIS — M25561 Pain in right knee: Secondary | ICD-10-CM | POA: Diagnosis not present

## 2020-02-04 NOTE — Progress Notes (Signed)
    SUBJECTIVE:   CHIEF COMPLAINT / HPI: knee injury at work  Knee pain- early morning 7/25 patient sustained injury at work. She was knocked over by a piece of equipment that made her land hard on her knees on a concrete floor. She noticed immediate pain but was able to stand and walk. Has difficulty ambulating at baseline due to left sided sciatic pain for which she is currently seeking treatment for with injections.  The pain is constant and equal bilaterally. Worsened with palpation and movement. Did not notice any bruising or swelling. Has not changed in severity since onset. Takes 800mg  ibuprofen BID to resolve the pain.  Would like documentation of the injury for work.  OBJECTIVE:   BP 110/74   Pulse 72   LMP 01/31/2020   SpO2 98%   General: NAD, pleasant, able to participate in exam Knees: bilateral knees tender to palpation at anterior surface of patella. Full painless rom bilaterally. Negative lateral stress, drawer tests, McMurray test. Gait is unaffected. Did not appreciate any skin changes/bruising or abrasions. Maybe some mild edema in suprapatellar pouch bilaterally. Neg bakers cyst. Strength 5/5 bilaterally. Neuro: alert and oriented x4, no focal deficits Psych: Normal affect and mood  ASSESSMENT/PLAN:   Acute bilateral knee pain - exam was positive for pain with palpation and ambulation. No joint or osseous abnormalities on exam - filled out work form and provided patient with office note - instructions to return if not improving by a week for xray - continue to take ibuprofen and tylenol as needed for pain     Richarda Osmond, [place]

## 2020-02-04 NOTE — Patient Instructions (Signed)
It was a pleasure to see you today!  To summarize our discussion for this visit:  Your knees do not show any signs that we need to recommend xrays at this time. If you are not seeing improvement within a week, please follow back up and we can consider getting imaging.  Today, I am providing the form from your work and a note from my office. Please let me know if you need anything else.   Otherwise, please continue to alternate tylenol and ibuprofen as needed for pain and move your legs to comfort. Try to avoid being sedentary.   Some additional health maintenance measures we should update are: Health Maintenance Due  Topic Date Due  . Hepatitis C Screening  Never done  . COVID-19 Vaccine (1) Never done  . PAP SMEAR-Modifier  09/04/2017  .    Call the clinic at [phone] if your symptoms worsen or you have any concerns.   Thank you for allowing me to take part in your care,  Dr. Doristine Mango

## 2020-02-04 NOTE — Progress Notes (Deleted)
    SUBJECTIVE:   CHIEF COMPLAINT / HPI: knee injury at work  Knee pain-   PERTINENT  PMH / PSH: ***  OBJECTIVE:   BP 110/74   Pulse 72   LMP 01/31/2020   SpO2 98%   ***  ASSESSMENT/PLAN:   No problem-specific Assessment & Plan notes found for this encounter.     [place]

## 2020-02-06 DIAGNOSIS — F319 Bipolar disorder, unspecified: Secondary | ICD-10-CM | POA: Diagnosis not present

## 2020-02-07 NOTE — Assessment & Plan Note (Signed)
-   exam was positive for pain with palpation and ambulation. No joint or osseous abnormalities on exam - filled out work form and provided patient with office note - instructions to return if not improving by a week for xray - continue to take ibuprofen and tylenol as needed for pain

## 2020-02-10 ENCOUNTER — Ambulatory Visit (HOSPITAL_COMMUNITY)
Admission: EM | Admit: 2020-02-10 | Discharge: 2020-02-10 | Disposition: A | Discharge status: Home / Self Care | Payer: Medicare Other | Attending: Urgent Care | Admitting: Urgent Care

## 2020-02-10 ENCOUNTER — Other Ambulatory Visit: Payer: Self-pay

## 2020-02-10 ENCOUNTER — Encounter (HOSPITAL_COMMUNITY): Payer: Self-pay

## 2020-02-10 DIAGNOSIS — R109 Unspecified abdominal pain: Secondary | ICD-10-CM | POA: Diagnosis not present

## 2020-02-10 DIAGNOSIS — R1013 Epigastric pain: Secondary | ICD-10-CM | POA: Diagnosis not present

## 2020-02-10 DIAGNOSIS — R1010 Upper abdominal pain, unspecified: Secondary | ICD-10-CM

## 2020-02-10 DIAGNOSIS — Z3202 Encounter for pregnancy test, result negative: Secondary | ICD-10-CM

## 2020-02-10 LAB — POCT URINALYSIS DIP (DEVICE)
Bilirubin Urine: NEGATIVE
Glucose, UA: NEGATIVE mg/dL
Hgb urine dipstick: NEGATIVE
Ketones, ur: NEGATIVE mg/dL
Leukocytes,Ua: NEGATIVE
Nitrite: NEGATIVE
Protein, ur: NEGATIVE mg/dL
Specific Gravity, Urine: 1.025 (ref 1.005–1.030)
Urobilinogen, UA: 0.2 mg/dL (ref 0.0–1.0)
pH: 6 (ref 5.0–8.0)

## 2020-02-10 LAB — POC URINE PREG, ED: Preg Test, Ur: NEGATIVE

## 2020-02-10 MED ORDER — FAMOTIDINE 20 MG PO TABS
20.0000 mg | ORAL_TABLET | Freq: Two times a day (BID) | ORAL | 0 refills | Status: DC
Start: 2020-02-10 — End: 2021-05-11

## 2020-02-10 NOTE — ED Provider Notes (Signed)
[facility]   MRN: [id number] DOB: Dec 09, 1980  Subjective:   Colleen Peck is a 39 y.o. female presenting for acute onset today of mid upper abdominal pain.  Has also had some mild nausea and upper belly cramping.  States that she ate a potato salad that is going to be expired tomorrow.  She discussed this with other health providers and they stated that she was dehydrated so she came in to get checked for that.  Admits that she eats a lot of acidic foods, eats a lot of tomato-based foods.  She also started drinking coffee again recently and has not drank coffee for years.  She is drinking it on an empty stomach.  Has been trying to hydrate with alkaline water.  At this time in the clinic she has no active pain.  No vomiting, diarrhea or constipation.  No current facility-administered medications for this encounter.  Current Outpatient Medications:  .  ARIPiprazole (ABILIFY) 2 MG tablet, Take 2 mg by mouth daily., Disp: , Rfl:  .  lamoTRIgine (LAMICTAL) 200 MG tablet, Take 1 tablet (200 mg total) by mouth daily. Take 1 tablet (25 mg) in the morning & 2 tablets (50 mg) in the evening: For mood stabilization (Patient taking differently: Take 200 mg by mouth at bedtime. Take 1 tablet (25 mg) in the morning & 2 tablets (50 mg) in the evening: For mood stabilizatio), Disp: 30 tablet, Rfl: 11 .  levothyroxine (SYNTHROID) 112 MCG tablet, TAKE 1 TABLET BY MOUTH DAILY, Disp: 90 tablet, Rfl: 3 .  montelukast (SINGULAIR) 10 MG tablet, Take 1 tablet (10 mg total) by mouth at bedtime., Disp: 30 tablet, Rfl: 0 .  pregabalin (LYRICA) 50 MG capsule, TAKE 1 CAPSULE BY MOUTH EVERY NIGHT AT BEDTIME FOR 7 NIGHTS THEN TAKE 1 CAPSULE BY MOUTH TWICE DAILY, Disp: , Rfl:  .  albuterol (ACCUNEB) 1.25 MG/3ML nebulizer solution, Take 3 mLs (1.25 mg total) by nebulization every 6 (six) hours as needed for wheezing., Disp: 75 mL, Rfl: 1 .  albuterol (VENTOLIN HFA) 108 (90 Base) MCG/ACT inhaler, Inhale 2 puffs into  the lungs every 4 (four) hours as needed for wheezing or shortness of breath., Disp: 18 g, Rfl: 1 .  APPLE CIDER VINEGAR PO, Take by mouth., Disp: , Rfl:  .  beclomethasone (QVAR) 80 MCG/ACT inhaler, Inhale 2 puffs into the lungs daily as needed (For [facility]). , Disp: , Rfl:  .  cyclobenzaprine (FLEXERIL) 5 MG tablet, Take 1 tablet (5 mg total) by mouth 3 (three) times daily as needed for muscle spasms., Disp: 30 tablet, Rfl: 1 .  Emollient (COLLAGEN EX), Apply topically., Disp: , Rfl:  .  Ginger, Zingiber officinalis, (GINGER EXTRACT PO), Take by mouth., Disp: , Rfl:  .  OVER THE COUNTER MEDICATION, GOLI vitamin, Disp: , Rfl:  .  Prenatal Vit-Fe Fumarate-FA (PRENATAL MULTIVITAMIN) TABS tablet, Take 1 tablet by mouth daily at 12 noon., Disp: , Rfl:    Allergies  Allergen Reactions  . Kiwi Extract Swelling    Past Medical History:  Diagnosis Date  . Abnormal pap    pt reports abnl pap many years ago.  Nl since then.  . Allergy    seasonal  . Anemia   . Asthma   . Bipolar disorder ([place])   . Depression   . Palpitations 03/12/2008  . PTSD (post-traumatic stress disorder)   . Seasonal allergies   . Thyroid disease 2009   Graves disease (pt reported resolved); hypothyriodism  Past Surgical History:  Procedure Laterality Date  . DILATION AND CURETTAGE OF UTERUS  March 2006  . LYMPH NODE BIOPSY Right 11/23/2019   Procedure: EXCISIONAL BIOPSY OF RIGHT NECK NODE;  Surgeon: Rozetta Nunnery, MD;  Location: [facility];  Service: ENT;  Laterality: Right;    Family History  Problem Relation Age of Onset  . Drug abuse Father   . Depression Maternal Grandmother   . Anxiety disorder Maternal Grandmother   . COPD Maternal Grandmother   . Suicidality Cousin   . Depression Cousin   . Bipolar disorder Cousin   . Hypertension Mother   . Depression Mother   . Diabetes Paternal Grandfather   . COPD Paternal Grandmother   . Depression Maternal Aunt   . Breast cancer  Maternal Aunt   . Depression Maternal Aunt   . Heart disease Neg Hx     Social History   Tobacco Use  . Smoking status: Never Smoker  . Smokeless tobacco: Never Used  Vaping Use  . Vaping Use: Never used  Substance Use Topics  . Alcohol use: Yes    Comment: occ  . Drug use: Yes    Types: Marijuana    Comment: past use of marijuana in '08-'09. occasional eats brownies w/ marijuana      ROS   Objective:   Vitals: BP 100/77 (BP Location: Left Arm)   Pulse 79   Temp 98.1 F (36.7 C) (Oral)   Resp 20   LMP 01/31/2020   SpO2 100%   Physical Exam Constitutional:      General: She is not in acute distress.    Appearance: Normal appearance. She is well-developed and normal weight. She is not ill-appearing, toxic-appearing or diaphoretic.  HENT:     Head: Normocephalic and atraumatic.     Right Ear: External ear normal.     Left Ear: External ear normal.     Nose: Nose normal.     Mouth/Throat:     Mouth: Mucous membranes are moist.     Pharynx: Oropharynx is clear.  Eyes:     General: No scleral icterus.    Extraocular Movements: Extraocular movements intact.     Pupils: Pupils are equal, round, and reactive to light.  Cardiovascular:     Rate and Rhythm: Normal rate and regular rhythm.     Heart sounds: Normal heart sounds. No murmur heard.  No friction rub. No gallop.   Pulmonary:     Effort: Pulmonary effort is normal. No respiratory distress.     Breath sounds: Normal breath sounds. No stridor. No wheezing, rhonchi or rales.  Abdominal:     General: Bowel sounds are normal. There is no distension.     Palpations: Abdomen is soft. There is no mass.     Tenderness: There is no abdominal tenderness. There is no right CVA tenderness, left CVA tenderness, guarding or rebound.  Skin:    General: Skin is warm and dry.     Coloration: Skin is not pale.     Findings: No rash.  Neurological:     General: No focal deficit present.     Mental Status: She is alert and  oriented to person, place, and time.  Psychiatric:        Mood and Affect: Mood normal.        Behavior: Behavior normal.        Thought Content: Thought content normal.        Judgment: Judgment normal.  Results for orders placed or performed during the hospital encounter of 02/10/20 (from the past 24 hour(s))  POCT urinalysis dip (device)     Status: None   Collection Time: 02/10/20  5:14 PM  Result Value Ref Range   Glucose, UA NEGATIVE NEGATIVE mg/dL   Bilirubin Urine NEGATIVE NEGATIVE   Ketones, ur NEGATIVE NEGATIVE mg/dL   Specific Gravity, Urine 1.025 1.005 - 1.030   Hgb urine dipstick NEGATIVE NEGATIVE   pH 6.0 5.0 - 8.0   Protein, ur NEGATIVE NEGATIVE mg/dL   Urobilinogen, UA 0.2 0.0 - 1.0 mg/dL   Nitrite NEGATIVE NEGATIVE   Leukocytes,Ua NEGATIVE NEGATIVE  POC urine pregnancy     Status: None   Collection Time: 02/10/20  5:20 PM  Result Value Ref Range   Preg Test, Ur NEGATIVE NEGATIVE    Assessment and Plan :   PDMP not reviewed this encounter.  1. Epigastric pain   2. Upper abdominal pain     Recommended dietary modifications.  Start Pepcid.  No signs of acute abdomen. Counseled patient on potential for adverse effects with medications prescribed/recommended today, ER and return-to-clinic precautions discussed, patient verbalized understanding.    Jaynee Eagles, [place] 02/10/20 1749

## 2020-02-10 NOTE — ED Triage Notes (Signed)
Pt c/o acute abdominal pain mid-epigastric and central area and nausea onset at 1300 today. Pt reports abdom cramping began approx 20 minutes after eating potato salad "due to expire tomorrow" and states she hasn't been drinking enough water, "feel like I might be dehydrated".  Pt denies abdominal pain at present and tolerating drinking water. Last BM yesterday WNL for pt. Pt denies fever, chills, v/d, dysuria sx.

## 2020-02-11 DIAGNOSIS — M5416 Radiculopathy, lumbar region: Secondary | ICD-10-CM | POA: Diagnosis not present

## 2020-02-20 DIAGNOSIS — F319 Bipolar disorder, unspecified: Secondary | ICD-10-CM | POA: Diagnosis not present

## 2020-02-25 DIAGNOSIS — M5126 Other intervertebral disc displacement, lumbar region: Secondary | ICD-10-CM | POA: Diagnosis not present

## 2020-02-25 DIAGNOSIS — M5416 Radiculopathy, lumbar region: Secondary | ICD-10-CM | POA: Diagnosis not present

## 2020-03-05 DIAGNOSIS — F319 Bipolar disorder, unspecified: Secondary | ICD-10-CM | POA: Diagnosis not present

## 2020-03-10 DIAGNOSIS — M5416 Radiculopathy, lumbar region: Secondary | ICD-10-CM | POA: Diagnosis not present

## 2020-03-27 ENCOUNTER — Ambulatory Visit (INDEPENDENT_AMBULATORY_CARE_PROVIDER_SITE_OTHER): Payer: Federal, State, Local not specified - PPO | Admitting: Family Medicine

## 2020-03-27 ENCOUNTER — Other Ambulatory Visit: Payer: Self-pay

## 2020-03-27 VITALS — BP 102/74 | HR 74 | Wt 256.2 lb

## 2020-03-27 DIAGNOSIS — R21 Rash and other nonspecific skin eruption: Secondary | ICD-10-CM

## 2020-03-27 MED ORDER — HYDROCORTISONE 1 % EX CREA: 1.0000 "application " | TOPICAL_CREAM | Freq: Two times a day (BID) | CUTANEOUS | 0 refills | Status: DC

## 2020-03-27 NOTE — Progress Notes (Signed)
    SUBJECTIVE:   CHIEF COMPLAINT / HPI:   Colleen Peck is a 39 yo F who present to [facility] for the below issue.   Rash under arms Experiencing a rash under arm and bra line that started a few weeks ago. They are darker than her normal skin tone and itch. Not currently trying anything for the itch and irritation. Denies changes in diet, bath soaps, and laundry detergent. She has a history of eczema, dermatofibromas, and keloids. Has a dermatologist that she sees at [facility]  PMH / [place]: Allergic rhinitis, Asthma  OBJECTIVE:   BP 102/74   Pulse 74   Wt 256 lb 3.2 oz (116.2 kg)   LMP 03/26/2020   SpO2 98%   BMI 40.13 kg/m   General: Appears well, no acute distress. Age appropriate. Skin: Warm and dry, rashes as denoted in the picture below. Multiple darker purplish colored macules. Distributions in a horizontal somewhat linear fashion.  Media Information   Document Information  Photos    03/27/2020 14:38  Attached To:  Office Visit on 03/27/20 with Autry-Lott, Naaman Plummer, DO  Source Information  Autry-Lott, [facility] Med Resident  Media Information   Document Information  Photos    03/27/2020 14:38  Attached To:  Office Visit on 03/27/20 with Autry-Lott, Naaman Plummer, DO  Source Information  Autry-Lott, Naaman Plummer, DO  [facility] Resident   ASSESSMENT/PLAN:   Skin rash Recent skin eruption distribution along the bra line. Symptoms and presentation suggestive of dermatofibroma or contact dermatitis from ill fitting bra. Offered for patient to be seen in our dermatology clinic but she prefers to follow up with her current dermatologist. Will give hydrocortisone cream for symptomatic relief. Suggested patient wear looser fitting bras. Follow up precautions given.  -Hydrocortisone cream 1% BID PRN on affected area -Follow up with personal dermatologist  -Return if symptoms fail to resolve   Gerlene Fee, [place]

## 2020-03-27 NOTE — Patient Instructions (Signed)
It was wonderful to see you today.  Please bring ALL of your medications with you to every visit.   Today we talked about:  The rash under your arms. This may be dermatofibromas or skin irration from your bra. Attempt hydrocortisone cream for itching and wear looser fitting bras. Follow up with your dermatology as soon as possible.   Please call the clinic at [phone] if your symptoms worsen or you have any concerns. It was our pleasure to serve you.  Dr. Janus Molder

## 2020-04-07 ENCOUNTER — Telehealth: Payer: Self-pay | Admitting: Family Medicine

## 2020-04-07 NOTE — Assessment & Plan Note (Signed)
Recent skin eruption distribution along the bra line. Symptoms and presentation suggestive of dermatofibroma or contact dermatitis from ill fitting bra. Offered for patient to be seen in our dermatology clinic but she prefers to follow up with her current dermatologist. Will give hydrocortisone cream for symptomatic relief. Suggested patient wear looser fitting bras. Follow up precautions given.  -Hydrocortisone cream 1% BID PRN on affected area -Follow up with personal dermatologist  -Return if symptoms fail to resolve

## 2020-04-07 NOTE — Telephone Encounter (Signed)
Received a page to the after-hours emergency line asking to call the patient.  I attempted to call the patient and no one answered.  I left a voicemail indicating that I was calling her back and if she had any issues she could repage.  I attempted to call the patient a second time and she did not answer.

## 2020-04-08 ENCOUNTER — Encounter: Payer: Self-pay | Admitting: Family Medicine

## 2020-04-09 DIAGNOSIS — F319 Bipolar disorder, unspecified: Secondary | ICD-10-CM | POA: Diagnosis not present

## 2020-04-15 DIAGNOSIS — L304 Erythema intertrigo: Secondary | ICD-10-CM | POA: Diagnosis not present

## 2020-04-15 DIAGNOSIS — R202 Paresthesia of skin: Secondary | ICD-10-CM | POA: Diagnosis not present

## 2020-04-21 DIAGNOSIS — M5126 Other intervertebral disc displacement, lumbar region: Secondary | ICD-10-CM | POA: Diagnosis not present

## 2020-04-21 DIAGNOSIS — M5416 Radiculopathy, lumbar region: Secondary | ICD-10-CM | POA: Diagnosis not present

## 2020-04-23 DIAGNOSIS — F319 Bipolar disorder, unspecified: Secondary | ICD-10-CM | POA: Diagnosis not present

## 2020-05-01 ENCOUNTER — Ambulatory Visit: Payer: Medicare Other | Admitting: Family Medicine

## 2020-05-01 DIAGNOSIS — F411 Generalized anxiety disorder: Secondary | ICD-10-CM | POA: Diagnosis not present

## 2020-05-01 NOTE — Progress Notes (Deleted)
    SUBJECTIVE:   CHIEF COMPLAINT / HPI:   Hypothyroidism Current regimen: Synthroid 112 mcg daily Last TSH 11/14/2018 was within normal limits ***  Vitamin D deficiency Last vitamin D checked on 03/21/2018 was 25 Current regimen***  Obesity  ***  Her last lipid panel was in 2017, LDL at that time was 80, total cholesterol was 150  Normocytic anemia Last CBC performed on 10/04/2019, hemoglobin at that time was 10.7 ***  Med rec***  PERTINENT  PMH / PSH: ***  OBJECTIVE:   There were no vitals taken for this visit.  ***  ASSESSMENT/PLAN:   No problem-specific Assessment & Plan notes found for this encounter.     [name], [place]

## 2020-05-05 DIAGNOSIS — F319 Bipolar disorder, unspecified: Secondary | ICD-10-CM | POA: Diagnosis not present

## 2020-05-07 ENCOUNTER — Ambulatory Visit: Payer: Federal, State, Local not specified - PPO | Admitting: Family Medicine

## 2020-05-07 NOTE — Progress Notes (Deleted)
° ° °  SUBJECTIVE:   CHIEF COMPLAINT / HPI:   Hypothyroidism Hx Graves' disease with radioablation in 2011 Last TSH 11/14/2018, was within normal limits at that time Current regimen: Levothyroxine 112 mcg daily ***  Vitamin D deficiency Last vitamin D checked on 03/21/2018, was 25 at that time Currently taking***  Normocytic anemia Last CBC on 10/04/2019 with hemoglobin of 10.7, was 10.81-year before, MCV within normal limits LMP***, periods***  Obesity Last lipid panel on 10/05/2015, triglycerides were elevated to 192, LDL was 80 ***  PERTINENT  PMH / PSH: Asthma, allergic rhinitis, eczema, obesity, vitamin D deficiency, PTSD with schizoaffective disorder, anxiety  OBJECTIVE:   There were no vitals taken for this visit.   Physical Exam: *** General: 39 y.o. female in NAD Cardio: RRR no m/r/g Lungs: CTAB, no wheezing, no rhonchi, no crackles, no IWOB on *** Abdomen: Soft, non-tender to palpation, non-distended, positive bowel sounds Skin: warm and dry Extremities: No edema   ASSESSMENT/PLAN:   No problem-specific Assessment & Plan notes found for this encounter.     [name], [place]

## 2020-05-08 DIAGNOSIS — F319 Bipolar disorder, unspecified: Secondary | ICD-10-CM | POA: Diagnosis not present

## 2020-05-09 DIAGNOSIS — R079 Chest pain, unspecified: Secondary | ICD-10-CM | POA: Diagnosis not present

## 2020-05-13 ENCOUNTER — Other Ambulatory Visit: Payer: Self-pay | Admitting: *Deleted

## 2020-05-13 MED ORDER — ALBUTEROL SULFATE 1.25 MG/3ML IN NEBU: 1.0000 | INHALATION_SOLUTION | Freq: Four times a day (QID) | RESPIRATORY_TRACT | 1 refills | Status: DC | PRN

## 2020-05-19 DIAGNOSIS — K5900 Constipation, unspecified: Secondary | ICD-10-CM | POA: Diagnosis not present

## 2020-05-19 DIAGNOSIS — R5383 Other fatigue: Secondary | ICD-10-CM | POA: Diagnosis not present

## 2020-05-19 DIAGNOSIS — R1013 Epigastric pain: Secondary | ICD-10-CM | POA: Diagnosis not present

## 2020-05-19 DIAGNOSIS — R079 Chest pain, unspecified: Secondary | ICD-10-CM | POA: Diagnosis not present

## 2020-05-19 DIAGNOSIS — R1030 Lower abdominal pain, unspecified: Secondary | ICD-10-CM | POA: Diagnosis not present

## 2020-05-19 DIAGNOSIS — I498 Other specified cardiac arrhythmias: Secondary | ICD-10-CM | POA: Diagnosis not present

## 2020-05-21 DIAGNOSIS — D508 Other iron deficiency anemias: Secondary | ICD-10-CM | POA: Diagnosis not present

## 2020-05-21 DIAGNOSIS — E039 Hypothyroidism, unspecified: Secondary | ICD-10-CM | POA: Diagnosis not present

## 2020-05-21 DIAGNOSIS — E559 Vitamin D deficiency, unspecified: Secondary | ICD-10-CM | POA: Diagnosis not present

## 2020-05-22 DIAGNOSIS — M5416 Radiculopathy, lumbar region: Secondary | ICD-10-CM | POA: Diagnosis not present

## 2020-05-22 DIAGNOSIS — M5126 Other intervertebral disc displacement, lumbar region: Secondary | ICD-10-CM | POA: Diagnosis not present

## 2020-05-29 DIAGNOSIS — F319 Bipolar disorder, unspecified: Secondary | ICD-10-CM | POA: Diagnosis not present

## 2020-06-04 DIAGNOSIS — D508 Other iron deficiency anemias: Secondary | ICD-10-CM | POA: Diagnosis not present

## 2020-06-04 DIAGNOSIS — E039 Hypothyroidism, unspecified: Secondary | ICD-10-CM | POA: Diagnosis not present

## 2020-06-11 DIAGNOSIS — F319 Bipolar disorder, unspecified: Secondary | ICD-10-CM | POA: Diagnosis not present

## 2020-06-20 DIAGNOSIS — F411 Generalized anxiety disorder: Secondary | ICD-10-CM | POA: Diagnosis not present

## 2020-06-25 DIAGNOSIS — F319 Bipolar disorder, unspecified: Secondary | ICD-10-CM | POA: Diagnosis not present

## 2020-07-16 DIAGNOSIS — F319 Bipolar disorder, unspecified: Secondary | ICD-10-CM | POA: Diagnosis not present

## 2020-07-22 DIAGNOSIS — E039 Hypothyroidism, unspecified: Secondary | ICD-10-CM | POA: Diagnosis not present

## 2020-07-23 DIAGNOSIS — L0292 Furuncle, unspecified: Secondary | ICD-10-CM | POA: Diagnosis not present

## 2020-07-23 DIAGNOSIS — E039 Hypothyroidism, unspecified: Secondary | ICD-10-CM | POA: Diagnosis not present

## 2020-08-06 DIAGNOSIS — F319 Bipolar disorder, unspecified: Secondary | ICD-10-CM | POA: Diagnosis not present

## 2020-08-13 DIAGNOSIS — F319 Bipolar disorder, unspecified: Secondary | ICD-10-CM | POA: Diagnosis not present

## 2020-08-21 DIAGNOSIS — M5416 Radiculopathy, lumbar region: Secondary | ICD-10-CM | POA: Diagnosis not present

## 2020-08-21 DIAGNOSIS — M5126 Other intervertebral disc displacement, lumbar region: Secondary | ICD-10-CM | POA: Diagnosis not present

## 2020-08-27 DIAGNOSIS — F319 Bipolar disorder, unspecified: Secondary | ICD-10-CM | POA: Diagnosis not present

## 2020-09-03 DIAGNOSIS — F319 Bipolar disorder, unspecified: Secondary | ICD-10-CM | POA: Diagnosis not present

## 2020-10-01 DIAGNOSIS — R200 Anesthesia of skin: Secondary | ICD-10-CM | POA: Diagnosis not present

## 2020-10-01 DIAGNOSIS — R202 Paresthesia of skin: Secondary | ICD-10-CM | POA: Diagnosis not present

## 2020-10-01 DIAGNOSIS — E039 Hypothyroidism, unspecified: Secondary | ICD-10-CM | POA: Diagnosis not present

## 2020-10-08 DIAGNOSIS — F312 Bipolar disorder, current episode manic severe with psychotic features: Secondary | ICD-10-CM | POA: Diagnosis not present

## 2020-10-20 DIAGNOSIS — R200 Anesthesia of skin: Secondary | ICD-10-CM | POA: Diagnosis not present

## 2020-10-20 DIAGNOSIS — M79641 Pain in right hand: Secondary | ICD-10-CM | POA: Diagnosis not present

## 2020-10-20 DIAGNOSIS — M180 Bilateral primary osteoarthritis of first carpometacarpal joints: Secondary | ICD-10-CM | POA: Diagnosis not present

## 2020-10-20 DIAGNOSIS — R202 Paresthesia of skin: Secondary | ICD-10-CM | POA: Diagnosis not present

## 2020-10-23 DIAGNOSIS — M5126 Other intervertebral disc displacement, lumbar region: Secondary | ICD-10-CM | POA: Diagnosis not present

## 2020-10-23 DIAGNOSIS — M5416 Radiculopathy, lumbar region: Secondary | ICD-10-CM | POA: Diagnosis not present

## 2020-11-05 DIAGNOSIS — F312 Bipolar disorder, current episode manic severe with psychotic features: Secondary | ICD-10-CM | POA: Diagnosis not present

## 2020-11-10 DIAGNOSIS — E039 Hypothyroidism, unspecified: Secondary | ICD-10-CM | POA: Diagnosis not present

## 2020-11-12 DIAGNOSIS — M79605 Pain in left leg: Secondary | ICD-10-CM | POA: Diagnosis not present

## 2020-11-12 DIAGNOSIS — R202 Paresthesia of skin: Secondary | ICD-10-CM | POA: Diagnosis not present

## 2020-11-12 DIAGNOSIS — M5459 Other low back pain: Secondary | ICD-10-CM | POA: Diagnosis not present

## 2020-11-12 DIAGNOSIS — M6281 Muscle weakness (generalized): Secondary | ICD-10-CM | POA: Diagnosis not present

## 2020-11-14 DIAGNOSIS — M6281 Muscle weakness (generalized): Secondary | ICD-10-CM | POA: Diagnosis not present

## 2020-11-14 DIAGNOSIS — M5459 Other low back pain: Secondary | ICD-10-CM | POA: Diagnosis not present

## 2020-11-14 DIAGNOSIS — M79605 Pain in left leg: Secondary | ICD-10-CM | POA: Diagnosis not present

## 2020-11-14 DIAGNOSIS — R202 Paresthesia of skin: Secondary | ICD-10-CM | POA: Diagnosis not present

## 2020-11-17 DIAGNOSIS — M5459 Other low back pain: Secondary | ICD-10-CM | POA: Diagnosis not present

## 2020-11-17 DIAGNOSIS — M79605 Pain in left leg: Secondary | ICD-10-CM | POA: Diagnosis not present

## 2020-11-17 DIAGNOSIS — M6281 Muscle weakness (generalized): Secondary | ICD-10-CM | POA: Diagnosis not present

## 2020-11-17 DIAGNOSIS — R202 Paresthesia of skin: Secondary | ICD-10-CM | POA: Diagnosis not present

## 2020-11-18 DIAGNOSIS — L509 Urticaria, unspecified: Secondary | ICD-10-CM | POA: Diagnosis not present

## 2020-11-18 DIAGNOSIS — J4520 Mild intermittent asthma, uncomplicated: Secondary | ICD-10-CM | POA: Diagnosis not present

## 2020-11-20 DIAGNOSIS — F319 Bipolar disorder, unspecified: Secondary | ICD-10-CM | POA: Diagnosis not present

## 2020-11-21 DIAGNOSIS — F312 Bipolar disorder, current episode manic severe with psychotic features: Secondary | ICD-10-CM | POA: Diagnosis not present

## 2020-11-25 DIAGNOSIS — M5459 Other low back pain: Secondary | ICD-10-CM | POA: Diagnosis not present

## 2020-11-25 DIAGNOSIS — M6281 Muscle weakness (generalized): Secondary | ICD-10-CM | POA: Diagnosis not present

## 2020-11-25 DIAGNOSIS — R202 Paresthesia of skin: Secondary | ICD-10-CM | POA: Diagnosis not present

## 2020-11-25 DIAGNOSIS — M79605 Pain in left leg: Secondary | ICD-10-CM | POA: Diagnosis not present

## 2020-11-26 DIAGNOSIS — F319 Bipolar disorder, unspecified: Secondary | ICD-10-CM | POA: Diagnosis not present

## 2020-11-28 DIAGNOSIS — R202 Paresthesia of skin: Secondary | ICD-10-CM | POA: Diagnosis not present

## 2020-11-28 DIAGNOSIS — M79605 Pain in left leg: Secondary | ICD-10-CM | POA: Diagnosis not present

## 2020-11-28 DIAGNOSIS — M5459 Other low back pain: Secondary | ICD-10-CM | POA: Diagnosis not present

## 2020-11-28 DIAGNOSIS — M6281 Muscle weakness (generalized): Secondary | ICD-10-CM | POA: Diagnosis not present

## 2020-12-01 DIAGNOSIS — M6281 Muscle weakness (generalized): Secondary | ICD-10-CM | POA: Diagnosis not present

## 2020-12-01 DIAGNOSIS — M79605 Pain in left leg: Secondary | ICD-10-CM | POA: Diagnosis not present

## 2020-12-01 DIAGNOSIS — M5459 Other low back pain: Secondary | ICD-10-CM | POA: Diagnosis not present

## 2020-12-01 DIAGNOSIS — R202 Paresthesia of skin: Secondary | ICD-10-CM | POA: Diagnosis not present

## 2020-12-02 ENCOUNTER — Other Ambulatory Visit: Payer: Self-pay | Admitting: Family Medicine

## 2020-12-02 DIAGNOSIS — J45909 Unspecified asthma, uncomplicated: Secondary | ICD-10-CM

## 2020-12-05 DIAGNOSIS — J029 Acute pharyngitis, unspecified: Secondary | ICD-10-CM | POA: Diagnosis not present

## 2020-12-05 DIAGNOSIS — R0602 Shortness of breath: Secondary | ICD-10-CM | POA: Diagnosis not present

## 2020-12-05 DIAGNOSIS — R059 Cough, unspecified: Secondary | ICD-10-CM | POA: Diagnosis not present

## 2020-12-05 DIAGNOSIS — R110 Nausea: Secondary | ICD-10-CM | POA: Diagnosis not present

## 2020-12-06 ENCOUNTER — Other Ambulatory Visit: Payer: Self-pay | Admitting: Family Medicine

## 2020-12-09 NOTE — Telephone Encounter (Signed)
2nd request for this.Colleen Peck, CMA

## 2020-12-10 DIAGNOSIS — R0602 Shortness of breath: Secondary | ICD-10-CM | POA: Diagnosis not present

## 2020-12-10 DIAGNOSIS — Z8709 Personal history of other diseases of the respiratory system: Secondary | ICD-10-CM | POA: Diagnosis not present

## 2020-12-10 DIAGNOSIS — U071 COVID-19: Secondary | ICD-10-CM | POA: Diagnosis not present

## 2020-12-10 DIAGNOSIS — J0140 Acute pansinusitis, unspecified: Secondary | ICD-10-CM | POA: Diagnosis not present

## 2020-12-10 DIAGNOSIS — F319 Bipolar disorder, unspecified: Secondary | ICD-10-CM | POA: Diagnosis not present

## 2020-12-11 DIAGNOSIS — M5416 Radiculopathy, lumbar region: Secondary | ICD-10-CM | POA: Diagnosis not present

## 2020-12-11 DIAGNOSIS — M5126 Other intervertebral disc displacement, lumbar region: Secondary | ICD-10-CM | POA: Diagnosis not present

## 2020-12-17 DIAGNOSIS — L089 Local infection of the skin and subcutaneous tissue, unspecified: Secondary | ICD-10-CM | POA: Diagnosis not present

## 2020-12-17 DIAGNOSIS — U071 COVID-19: Secondary | ICD-10-CM | POA: Diagnosis not present

## 2020-12-17 DIAGNOSIS — L810 Postinflammatory hyperpigmentation: Secondary | ICD-10-CM | POA: Diagnosis not present

## 2020-12-17 DIAGNOSIS — L658 Other specified nonscarring hair loss: Secondary | ICD-10-CM | POA: Diagnosis not present

## 2020-12-17 DIAGNOSIS — L910 Hypertrophic scar: Secondary | ICD-10-CM | POA: Diagnosis not present

## 2020-12-17 DIAGNOSIS — R1084 Generalized abdominal pain: Secondary | ICD-10-CM | POA: Diagnosis not present

## 2020-12-18 DIAGNOSIS — M79605 Pain in left leg: Secondary | ICD-10-CM | POA: Diagnosis not present

## 2020-12-18 DIAGNOSIS — M6281 Muscle weakness (generalized): Secondary | ICD-10-CM | POA: Diagnosis not present

## 2020-12-18 DIAGNOSIS — R202 Paresthesia of skin: Secondary | ICD-10-CM | POA: Diagnosis not present

## 2020-12-18 DIAGNOSIS — M5459 Other low back pain: Secondary | ICD-10-CM | POA: Diagnosis not present

## 2020-12-23 DIAGNOSIS — M79605 Pain in left leg: Secondary | ICD-10-CM | POA: Diagnosis not present

## 2020-12-23 DIAGNOSIS — M5459 Other low back pain: Secondary | ICD-10-CM | POA: Diagnosis not present

## 2020-12-23 DIAGNOSIS — R202 Paresthesia of skin: Secondary | ICD-10-CM | POA: Diagnosis not present

## 2020-12-23 DIAGNOSIS — M6281 Muscle weakness (generalized): Secondary | ICD-10-CM | POA: Diagnosis not present

## 2020-12-24 DIAGNOSIS — R200 Anesthesia of skin: Secondary | ICD-10-CM | POA: Diagnosis not present

## 2020-12-24 DIAGNOSIS — F319 Bipolar disorder, unspecified: Secondary | ICD-10-CM | POA: Diagnosis not present

## 2020-12-24 DIAGNOSIS — M180 Bilateral primary osteoarthritis of first carpometacarpal joints: Secondary | ICD-10-CM | POA: Diagnosis not present

## 2020-12-24 DIAGNOSIS — M79646 Pain in unspecified finger(s): Secondary | ICD-10-CM | POA: Diagnosis not present

## 2020-12-24 DIAGNOSIS — R202 Paresthesia of skin: Secondary | ICD-10-CM | POA: Diagnosis not present

## 2020-12-25 DIAGNOSIS — F319 Bipolar disorder, unspecified: Secondary | ICD-10-CM | POA: Diagnosis not present

## 2020-12-25 DIAGNOSIS — R202 Paresthesia of skin: Secondary | ICD-10-CM | POA: Diagnosis not present

## 2020-12-25 DIAGNOSIS — M79605 Pain in left leg: Secondary | ICD-10-CM | POA: Diagnosis not present

## 2020-12-25 DIAGNOSIS — M5459 Other low back pain: Secondary | ICD-10-CM | POA: Diagnosis not present

## 2020-12-25 DIAGNOSIS — M6281 Muscle weakness (generalized): Secondary | ICD-10-CM | POA: Diagnosis not present

## 2020-12-29 DIAGNOSIS — R1011 Right upper quadrant pain: Secondary | ICD-10-CM | POA: Diagnosis not present

## 2020-12-29 DIAGNOSIS — E039 Hypothyroidism, unspecified: Secondary | ICD-10-CM | POA: Diagnosis not present

## 2020-12-30 DIAGNOSIS — M6281 Muscle weakness (generalized): Secondary | ICD-10-CM | POA: Diagnosis not present

## 2020-12-30 DIAGNOSIS — R202 Paresthesia of skin: Secondary | ICD-10-CM | POA: Diagnosis not present

## 2020-12-30 DIAGNOSIS — M5459 Other low back pain: Secondary | ICD-10-CM | POA: Diagnosis not present

## 2020-12-30 DIAGNOSIS — M79605 Pain in left leg: Secondary | ICD-10-CM | POA: Diagnosis not present

## 2021-01-13 DIAGNOSIS — E039 Hypothyroidism, unspecified: Secondary | ICD-10-CM | POA: Diagnosis not present

## 2021-01-13 DIAGNOSIS — D259 Leiomyoma of uterus, unspecified: Secondary | ICD-10-CM | POA: Diagnosis not present

## 2021-01-13 DIAGNOSIS — Z01419 Encounter for gynecological examination (general) (routine) without abnormal findings: Secondary | ICD-10-CM | POA: Diagnosis not present

## 2021-01-13 DIAGNOSIS — F32A Depression, unspecified: Secondary | ICD-10-CM | POA: Diagnosis not present

## 2021-01-15 DIAGNOSIS — F319 Bipolar disorder, unspecified: Secondary | ICD-10-CM | POA: Diagnosis not present

## 2021-01-17 ENCOUNTER — Other Ambulatory Visit: Payer: Self-pay | Admitting: Family Medicine

## 2021-01-21 DIAGNOSIS — F312 Bipolar disorder, current episode manic severe with psychotic features: Secondary | ICD-10-CM | POA: Diagnosis not present

## 2021-01-24 DIAGNOSIS — N6320 Unspecified lump in the left breast, unspecified quadrant: Secondary | ICD-10-CM | POA: Diagnosis not present

## 2021-01-30 DIAGNOSIS — F319 Bipolar disorder, unspecified: Secondary | ICD-10-CM | POA: Diagnosis not present

## 2021-02-02 DIAGNOSIS — N951 Menopausal and female climacteric states: Secondary | ICD-10-CM | POA: Diagnosis not present

## 2021-02-02 DIAGNOSIS — E559 Vitamin D deficiency, unspecified: Secondary | ICD-10-CM | POA: Diagnosis not present

## 2021-02-02 DIAGNOSIS — E782 Mixed hyperlipidemia: Secondary | ICD-10-CM | POA: Diagnosis not present

## 2021-02-02 DIAGNOSIS — D509 Iron deficiency anemia, unspecified: Secondary | ICD-10-CM | POA: Diagnosis not present

## 2021-02-02 DIAGNOSIS — R635 Abnormal weight gain: Secondary | ICD-10-CM | POA: Diagnosis not present

## 2021-02-04 DIAGNOSIS — Z1331 Encounter for screening for depression: Secondary | ICD-10-CM | POA: Diagnosis not present

## 2021-02-04 DIAGNOSIS — Z6841 Body Mass Index (BMI) 40.0 and over, adult: Secondary | ICD-10-CM | POA: Diagnosis not present

## 2021-02-04 DIAGNOSIS — N951 Menopausal and female climacteric states: Secondary | ICD-10-CM | POA: Diagnosis not present

## 2021-02-04 DIAGNOSIS — R635 Abnormal weight gain: Secondary | ICD-10-CM | POA: Diagnosis not present

## 2021-02-04 DIAGNOSIS — Z1339 Encounter for screening examination for other mental health and behavioral disorders: Secondary | ICD-10-CM | POA: Diagnosis not present

## 2021-02-10 DIAGNOSIS — R1011 Right upper quadrant pain: Secondary | ICD-10-CM | POA: Diagnosis not present

## 2021-02-10 DIAGNOSIS — R932 Abnormal findings on diagnostic imaging of liver and biliary tract: Secondary | ICD-10-CM | POA: Diagnosis not present

## 2021-02-12 DIAGNOSIS — F319 Bipolar disorder, unspecified: Secondary | ICD-10-CM | POA: Diagnosis not present

## 2021-02-13 DIAGNOSIS — R1011 Right upper quadrant pain: Secondary | ICD-10-CM | POA: Diagnosis not present

## 2021-02-13 DIAGNOSIS — F312 Bipolar disorder, current episode manic severe with psychotic features: Secondary | ICD-10-CM | POA: Diagnosis not present

## 2021-02-16 DIAGNOSIS — R922 Inconclusive mammogram: Secondary | ICD-10-CM | POA: Diagnosis not present

## 2021-02-16 DIAGNOSIS — N6342 Unspecified lump in left breast, subareolar: Secondary | ICD-10-CM | POA: Diagnosis not present

## 2021-02-17 DIAGNOSIS — Z319 Encounter for procreative management, unspecified: Secondary | ICD-10-CM | POA: Diagnosis not present

## 2021-02-18 DIAGNOSIS — F312 Bipolar disorder, current episode manic severe with psychotic features: Secondary | ICD-10-CM | POA: Diagnosis not present

## 2021-02-21 DIAGNOSIS — E039 Hypothyroidism, unspecified: Secondary | ICD-10-CM | POA: Diagnosis not present

## 2021-02-23 DIAGNOSIS — F312 Bipolar disorder, current episode manic severe with psychotic features: Secondary | ICD-10-CM | POA: Diagnosis not present

## 2021-02-24 DIAGNOSIS — F312 Bipolar disorder, current episode manic severe with psychotic features: Secondary | ICD-10-CM | POA: Diagnosis not present

## 2021-02-25 DIAGNOSIS — F312 Bipolar disorder, current episode manic severe with psychotic features: Secondary | ICD-10-CM | POA: Diagnosis not present

## 2021-02-25 DIAGNOSIS — F319 Bipolar disorder, unspecified: Secondary | ICD-10-CM | POA: Diagnosis not present

## 2021-03-04 DIAGNOSIS — F319 Bipolar disorder, unspecified: Secondary | ICD-10-CM | POA: Diagnosis not present

## 2021-03-18 DIAGNOSIS — F312 Bipolar disorder, current episode manic severe with psychotic features: Secondary | ICD-10-CM | POA: Diagnosis not present

## 2021-03-18 DIAGNOSIS — F319 Bipolar disorder, unspecified: Secondary | ICD-10-CM | POA: Diagnosis not present

## 2021-03-30 DIAGNOSIS — F319 Bipolar disorder, unspecified: Secondary | ICD-10-CM | POA: Diagnosis not present

## 2021-04-15 DIAGNOSIS — F319 Bipolar disorder, unspecified: Secondary | ICD-10-CM | POA: Diagnosis not present

## 2021-04-16 DIAGNOSIS — E559 Vitamin D deficiency, unspecified: Secondary | ICD-10-CM | POA: Diagnosis not present

## 2021-04-16 DIAGNOSIS — E039 Hypothyroidism, unspecified: Secondary | ICD-10-CM | POA: Diagnosis not present

## 2021-04-16 DIAGNOSIS — R519 Headache, unspecified: Secondary | ICD-10-CM | POA: Diagnosis not present

## 2021-04-16 DIAGNOSIS — D508 Other iron deficiency anemias: Secondary | ICD-10-CM | POA: Diagnosis not present

## 2021-04-16 DIAGNOSIS — N644 Mastodynia: Secondary | ICD-10-CM | POA: Diagnosis not present

## 2021-04-24 DIAGNOSIS — F312 Bipolar disorder, current episode manic severe with psychotic features: Secondary | ICD-10-CM | POA: Diagnosis not present

## 2021-04-28 DIAGNOSIS — F319 Bipolar disorder, unspecified: Secondary | ICD-10-CM | POA: Diagnosis not present

## 2021-05-01 ENCOUNTER — Other Ambulatory Visit: Payer: Self-pay

## 2021-05-01 ENCOUNTER — Encounter (HOSPITAL_BASED_OUTPATIENT_CLINIC_OR_DEPARTMENT_OTHER): Payer: Self-pay | Admitting: *Deleted

## 2021-05-01 ENCOUNTER — Emergency Department (HOSPITAL_BASED_OUTPATIENT_CLINIC_OR_DEPARTMENT_OTHER)
Admission: EM | Admit: 2021-05-01 | Discharge: 2021-05-01 | Disposition: A | Discharge status: Home / Self Care | Payer: Federal, State, Local not specified - PPO | Attending: Emergency Medicine | Admitting: Emergency Medicine

## 2021-05-01 DIAGNOSIS — N6320 Unspecified lump in the left breast, unspecified quadrant: Secondary | ICD-10-CM | POA: Insufficient documentation

## 2021-05-01 DIAGNOSIS — L608 Other nail disorders: Secondary | ICD-10-CM | POA: Insufficient documentation

## 2021-05-01 DIAGNOSIS — Z8616 Personal history of COVID-19: Secondary | ICD-10-CM | POA: Diagnosis not present

## 2021-05-01 DIAGNOSIS — J45909 Unspecified asthma, uncomplicated: Secondary | ICD-10-CM | POA: Diagnosis not present

## 2021-05-01 DIAGNOSIS — R42 Dizziness and giddiness: Secondary | ICD-10-CM | POA: Insufficient documentation

## 2021-05-01 DIAGNOSIS — B351 Tinea unguium: Secondary | ICD-10-CM | POA: Insufficient documentation

## 2021-05-01 DIAGNOSIS — Z79899 Other long term (current) drug therapy: Secondary | ICD-10-CM | POA: Diagnosis not present

## 2021-05-01 DIAGNOSIS — R519 Headache, unspecified: Secondary | ICD-10-CM | POA: Diagnosis not present

## 2021-05-01 DIAGNOSIS — E039 Hypothyroidism, unspecified: Secondary | ICD-10-CM | POA: Insufficient documentation

## 2021-05-01 DIAGNOSIS — Z7951 Long term (current) use of inhaled steroids: Secondary | ICD-10-CM | POA: Insufficient documentation

## 2021-05-01 MED ORDER — CICLOPIROX OLAMINE 0.77 % EX CREA: TOPICAL_CREAM | Freq: Two times a day (BID) | CUTANEOUS | 0 refills | Status: DC

## 2021-05-01 NOTE — ED Provider Notes (Signed)
[facility] EMERGENCY DEPT Provider Note   CSN: [id number] Arrival date & time: 05/01/21  1707     History Chief Complaint  Patient presents with   Nail bed color changes    Colleen Peck is a 40 y.o. female who presents the emergency department with multiple complaints.  Patient complains of darkening of the left thumbnail which has been there for weeks.  Initially started off as a light gray but has since become darker.  She does not recall any history of trauma to the nail.  Patient also complains of intermittent dizziness and headaches over the past several months.  She is been seen evaluated her primary care provider for this and was offered a CT scan of the head which the patient initially declined due to cost.  Patient also complains of a left breast lump.  This has been seen and evaluated by mammography and ultrasound.  This was ultimately found to be an abscess which drained on its own.  She also has a history of fibrocystic breast disease.  She denies any chest pain, shortness of breath, headache currently, dizziness currently, chest pain, fever, chills.  Patient also complains of bilateral great toe discoloration and nail thickening.  This has been going on for the last several weeks and getting worse.  The history is provided by the patient.      Past Medical History:  Diagnosis Date   Abnormal pap    pt reports abnl pap many years ago.  Nl since then.   Allergy    seasonal   Anemia    Asthma    Bipolar disorder ([place])    Depression    Palpitations 03/12/2008   PTSD (post-traumatic stress disorder)    Seasonal allergies    Thyroid disease 2009   Graves disease (pt reported resolved); hypothyriodism    Patient Active Problem List   Diagnosis Date Noted   Acute bilateral knee pain 02/04/2020   Chest pain 10/15/2019   Lump on neck 09/30/2019   Sciatic nerve pain, left 07/16/2019   Close exposure to COVID-19 virus 07/16/2019   Acute back pain with  radiculopathy 01/22/2019   Onycholysis 11/14/2018   Palpable lymph node 11/91/4782   Cyclical mastalgia 95/62/1308   PMS (premenstrual syndrome) 08/20/2018   Vertigo 05/31/2018   Dermatofibroma of right lower leg 01/06/2017   History of borderline personality disorder 01/01/2016   Carotidynia 12/16/2015   Schizoaffective disorder, bipolar type ([place]) 11/26/2015   Anxiety    PTSD (post-traumatic stress disorder) 09/30/2015   Routine screening for STI (sexually transmitted infection)    Paranoia ([place])    Hypothyroidism 02/26/2015   Anemia 01/30/2015   Vitamin D deficiency 05/22/2013   Skin rash 03/05/2013   SOB (shortness of breath) on exertion 07/13/2012   Obesity 09/08/2006   Allergic rhinitis 09/08/2006   Asthma 09/08/2006   ECZEMA, ATOPIC DERMATITIS 09/08/2006    Past Surgical History:  Procedure Laterality Date   DILATION AND CURETTAGE OF UTERUS  March 2006   LYMPH NODE BIOPSY Right 11/23/2019   Procedure: EXCISIONAL BIOPSY OF RIGHT NECK NODE;  Surgeon: Rozetta Nunnery, MD;  Location: [facility];  Service: ENT;  Laterality: Right;     OB History   No obstetric history on file.     Family History  Problem Relation Age of Onset   Drug abuse Father    Depression Maternal Grandmother    Anxiety disorder Maternal Grandmother    COPD Maternal Grandmother    Suicidality  Cousin    Depression Cousin    Bipolar disorder Cousin    Hypertension Mother    Depression Mother    Diabetes Paternal Grandfather    COPD Paternal Grandmother    Depression Maternal Aunt    Breast cancer Maternal Aunt    Depression Maternal Aunt    Heart disease Neg Hx     Social History   Tobacco Use   Smoking status: Never   Smokeless tobacco: Never  Vaping Use   Vaping Use: Never used  Substance Use Topics   Alcohol use: Yes    Comment: occ   Drug use: Not Currently    Types: Marijuana    Comment: past use of marijuana in '08-'09. occasional eats brownies w/  marijuana      Home Medications Prior to Admission medications   Medication Sig Start Date End Date Taking? Authorizing Provider  ciclopirox (LOPROX) 0.77 % cream Apply topically 2 (two) times daily. 05/01/21  Yes Shubham Thackston M, PA-C  albuterol (ACCUNEB) 1.25 MG/3ML nebulizer solution USE 1 VIAL VIA NEBULIZER EVERY 6 HOURS AS NEEDED FOR WHEEZING 01/19/21   Alen Bleacher, MD  albuterol (VENTOLIN HFA) 108 (90 Base) MCG/ACT inhaler INHALE 2 PUFFS INTO THE LUNGS EVERY 4 HOURS AS NEEDED FOR WHEEZING OR SHORTNESS OF BREATH 12/03/20   Benay Pike, MD  APPLE CIDER VINEGAR PO Take by mouth.    [provider]  ARIPiprazole (ABILIFY) 2 MG tablet Take 2 mg by mouth daily. 10/05/19   [provider]  beclomethasone (QVAR) 80 MCG/ACT inhaler Inhale 2 puffs into the lungs daily as needed (For [facility]).     [provider]  famotidine (PEPCID) 20 MG tablet Take 1 tablet (20 mg total) by mouth 2 (two) times daily. 02/10/20   Jaynee Eagles, PA-C  Ginger, Zingiber officinalis, (GINGER EXTRACT PO) Take by mouth.    [provider]  hydrocortisone cream 1 % Apply 1 application topically 2 (two) times daily. Apply to affected area under arm. 03/27/20   Autry-Lott, Naaman Plummer, DO  lamoTRIgine (LAMICTAL) 200 MG tablet Take 1 tablet (200 mg total) by mouth daily. Take 1 tablet (25 mg) in the morning & 2 tablets (50 mg) in the evening: For mood stabilization Patient taking differently: Take 200 mg by mouth at bedtime. Take 1 tablet (25 mg) in the morning & 2 tablets (50 mg) in the evening: For mood stabilizatio 11/24/17   Nicolette Bang, MD  levothyroxine (SYNTHROID) 112 MCG tablet TAKE 1 TABLET BY MOUTH DAILY 07/31/19   Meccariello, Bernita Raisin, DO  montelukast (SINGULAIR) 10 MG tablet Take 1 tablet (10 mg total) by mouth at bedtime. 10/29/19   Raylene Everts, MD  OVER THE COUNTER MEDICATION GOLI vitamin    [provider]  pregabalin (LYRICA) 50 MG capsule TAKE 1 CAPSULE  BY MOUTH EVERY NIGHT AT BEDTIME FOR 7 NIGHTS THEN TAKE 1 CAPSULE BY MOUTH TWICE DAILY 01/22/20   [provider]  Prenatal Vit-Fe Fumarate-FA (PRENATAL MULTIVITAMIN) TABS tablet Take 1 tablet by mouth daily at 12 noon.    [provider]    Allergies    Kiwi extract and Meat [alpha-gal]  Review of Systems   Review of Systems  All other systems reviewed and are negative.  Physical Exam Updated Vital Signs BP (!) 112/50   Pulse 70   Temp 98.4 F (36.9 C) (Oral)   Resp 18   Ht 5\' 6"  (1.676 m)   Wt 116.1 kg   SpO2  99%   BMI 41.32 kg/m   Physical Exam Vitals and nursing note reviewed. Exam conducted with a chaperone present.  Constitutional:      General: She is not in acute distress.    Appearance: Normal appearance.  HENT:     Head: Normocephalic and atraumatic.  Eyes:     General:        Right eye: No discharge.        Left eye: No discharge.  Cardiovascular:     Comments: Regular rate and rhythm.  S1/S2 are distinct without any evidence of murmur, rubs, or gallops.  Radial pulses are 2+ bilaterally.  Dorsalis pedis pulses are 2+ bilaterally.  No evidence of pedal edema. Pulmonary:     Comments: Clear to auscultation bilaterally.  Normal effort.  No respiratory distress.  No evidence of wheezes, rales, or rhonchi heard throughout. Chest:     Comments: Patient has fibrocystic breast bilaterally.  Left breast is worse than the right.  There was no obvious large area of fluctuance or abscess at this time.  No nipple discharge.  No evidence of inverted nipple.  No obvious masses or skin changes. Abdominal:     General: Abdomen is flat. Bowel sounds are normal. There is no distension.     Tenderness: There is no abdominal tenderness. There is no guarding or rebound.  Musculoskeletal:        General: Normal range of motion.     Cervical back: Neck supple.  Skin:    General: Skin is warm and dry.     Findings: No rash.     Comments: Left thumbnail has an area  of darkening towards the nailbed.  Area is nontender palpation.  Bilateral great distal toenails appear thickened and discolored.  Neurological:     General: No focal deficit present.     Mental Status: She is alert.  Psychiatric:        Mood and Affect: Mood normal.        Behavior: Behavior normal.    ED Results / Procedures / Treatments   Labs (all labs ordered are listed, but only abnormal results are displayed) Labs Reviewed - No data to display  EKG None  Radiology No results found.  Procedures Procedures   Medications Ordered in ED Medications - No data to display  ED Course  I have reviewed the triage vital signs and the nursing notes.  Pertinent labs & imaging results that were available during my care of the patient were reviewed by me and considered in my medical decision making (see chart for details).    MDM Rules/Calculators/A&P                          Diania Co is a 40 y.o. female who presents emergency department for evaluation of multiple complaints.  Her complaints do not seem emergent in nature at this time.  With regard to her darkening left thumbnail I am unsure exactly the cause at this time.  The nail itself seems intact and there is no other discoloration on the other digits.  She has an appointment with dermatology towards the end of next month.  I will also have her follow-up with her primary care in the meantime to ensure she has appropriate follow-up.  With regards to her dizziness and headaches, I offered a CT scan in the department today patient declined.  She is currently asymptomatic.  With regards to her bilateral great toe  nail discoloration and thickening this is likely onychomycosis.  I will prescribe her some topical antifungals in the interim until she follows up with dermatology.  We did a lot of shared decision-making with the patient and mother.  Patient understood and all questions and concerns addressed.  She is safe for  discharge.   Final Clinical Impression(s) / ED Diagnoses Final diagnoses:  Fungal infection of toenail    Rx / DC Orders ED Discharge Orders          Ordered    ciclopirox (LOPROX) 0.77 % cream  2 times daily        05/01/21 1842             Hendricks Limes, PA-C 05/01/21 1845    Davonna Belling, MD 05/01/21 2311

## 2021-05-01 NOTE — ED Triage Notes (Signed)
Pt concerned her nail beds are raising up on her great toes and her left thumb nail bed is grayish in color. This has been going on for a while. Also left breast pain which she has been seen for and received Mammogram in aug 8th and ultrasound the same day. C/o Headaches and dizzy, last episode of dizziness earlier today.

## 2021-05-06 DIAGNOSIS — F319 Bipolar disorder, unspecified: Secondary | ICD-10-CM | POA: Diagnosis not present

## 2021-05-08 ENCOUNTER — Encounter (HOSPITAL_BASED_OUTPATIENT_CLINIC_OR_DEPARTMENT_OTHER): Payer: Self-pay | Admitting: Emergency Medicine

## 2021-05-08 ENCOUNTER — Other Ambulatory Visit: Payer: Self-pay

## 2021-05-08 ENCOUNTER — Emergency Department (HOSPITAL_BASED_OUTPATIENT_CLINIC_OR_DEPARTMENT_OTHER)
Admission: EM | Admit: 2021-05-08 | Discharge: 2021-05-08 | Disposition: A | Discharge status: Home / Self Care | Payer: Federal, State, Local not specified - PPO | Attending: Emergency Medicine | Admitting: Emergency Medicine

## 2021-05-08 DIAGNOSIS — J45909 Unspecified asthma, uncomplicated: Secondary | ICD-10-CM | POA: Diagnosis not present

## 2021-05-08 DIAGNOSIS — E039 Hypothyroidism, unspecified: Secondary | ICD-10-CM | POA: Diagnosis not present

## 2021-05-08 DIAGNOSIS — R0981 Nasal congestion: Secondary | ICD-10-CM | POA: Insufficient documentation

## 2021-05-08 DIAGNOSIS — Z79899 Other long term (current) drug therapy: Secondary | ICD-10-CM | POA: Diagnosis not present

## 2021-05-08 DIAGNOSIS — R300 Dysuria: Secondary | ICD-10-CM | POA: Diagnosis not present

## 2021-05-08 LAB — URINALYSIS, ROUTINE W REFLEX MICROSCOPIC
Bilirubin Urine: NEGATIVE
Glucose, UA: NEGATIVE mg/dL
Ketones, ur: NEGATIVE mg/dL
Nitrite: NEGATIVE
Specific Gravity, Urine: 1.024 (ref 1.005–1.030)
pH: 5.5 (ref 5.0–8.0)

## 2021-05-08 NOTE — Discharge Instructions (Signed)
Urinalysis normal here today.  Based on some of your other complaints she may have a viral illness working on you.  Return for any new or worse symptoms.  Work note provided.

## 2021-05-08 NOTE — ED Triage Notes (Signed)
Nasal congestion, dysuria since Wednesday.

## 2021-05-08 NOTE — ED Provider Notes (Signed)
[facility] EMERGENCY DEPT Provider Note   CSN: [id number] Arrival date & time: 05/08/21  1025     History Chief Complaint  Patient presents with   Dysuria   Nasal Congestion    Colleen Peck is a 40 y.o. female.  Patient developed some dysuria since Wednesday.  Burning with urination.  Feels as if she may be a little dehydrated.  No specific symptoms.  Patient denies any CVA or back pain.  Any fevers.  Denies any vaginal discharge.      Past Medical History:  Diagnosis Date   Abnormal pap    pt reports abnl pap many years ago.  Nl since then.   Allergy    seasonal   Anemia    Asthma    Bipolar disorder ([place])    Depression    Palpitations 03/12/2008   PTSD (post-traumatic stress disorder)    Seasonal allergies    Thyroid disease 2009   Graves disease (pt reported resolved); hypothyriodism    Patient Active Problem List   Diagnosis Date Noted   Acute bilateral knee pain 02/04/2020   Chest pain 10/15/2019   Lump on neck 09/30/2019   Sciatic nerve pain, left 07/16/2019   Close exposure to COVID-19 virus 07/16/2019   Acute back pain with radiculopathy 01/22/2019   Onycholysis 11/14/2018   Palpable lymph node 85/27/7824   Cyclical mastalgia 23/53/6144   PMS (premenstrual syndrome) 08/20/2018   Vertigo 05/31/2018   Dermatofibroma of right lower leg 01/06/2017   History of borderline personality disorder 01/01/2016   Carotidynia 12/16/2015   Schizoaffective disorder, bipolar type ([place]) 11/26/2015   Anxiety    PTSD (post-traumatic stress disorder) 09/30/2015   Routine screening for STI (sexually transmitted infection)    Paranoia ([place])    Hypothyroidism 02/26/2015   Anemia 01/30/2015   Vitamin D deficiency 05/22/2013   Skin rash 03/05/2013   SOB (shortness of breath) on exertion 07/13/2012   Obesity 09/08/2006   Allergic rhinitis 09/08/2006   Asthma 09/08/2006   ECZEMA, ATOPIC DERMATITIS 09/08/2006    Past Surgical History:  Procedure  Laterality Date   DILATION AND CURETTAGE OF UTERUS  March 2006   LYMPH NODE BIOPSY Right 11/23/2019   Procedure: EXCISIONAL BIOPSY OF RIGHT NECK NODE;  Surgeon: Rozetta Nunnery, MD;  Location: [place];  Service: ENT;  Laterality: Right;     OB History   No obstetric history on file.     Family History  Problem Relation Age of Onset   Drug abuse Father    Depression Maternal Grandmother    Anxiety disorder Maternal Grandmother    COPD Maternal Grandmother    Suicidality Cousin    Depression Cousin    Bipolar disorder Cousin    Hypertension Mother    Depression Mother    Diabetes Paternal Grandfather    COPD Paternal Grandmother    Depression Maternal Aunt    Breast cancer Maternal Aunt    Depression Maternal Aunt    Heart disease Neg Hx     Social History   Tobacco Use   Smoking status: Never   Smokeless tobacco: Never  Vaping Use   Vaping Use: Never used  Substance Use Topics   Alcohol use: Yes    Comment: occ   Drug use: Not Currently    Types: Marijuana    Comment: past use of marijuana in '08-'09. occasional eats brownies w/ marijuana      Home Medications Prior to Admission medications   Medication Sig  Start Date End Date Taking? Authorizing Provider  albuterol (ACCUNEB) 1.25 MG/3ML nebulizer solution USE 1 VIAL VIA NEBULIZER EVERY 6 HOURS AS NEEDED FOR WHEEZING 01/19/21   Alen Bleacher, MD  albuterol (VENTOLIN HFA) 108 (90 Base) MCG/ACT inhaler INHALE 2 PUFFS INTO THE LUNGS EVERY 4 HOURS AS NEEDED FOR WHEEZING OR SHORTNESS OF BREATH 12/03/20   Benay Pike, MD  APPLE CIDER VINEGAR PO Take by mouth.    [provider]  ARIPiprazole (ABILIFY) 2 MG tablet Take 2 mg by mouth daily. 10/05/19   [provider]  beclomethasone (QVAR) 80 MCG/ACT inhaler Inhale 2 puffs into the lungs daily as needed (For [facility]).     [provider]  ciclopirox (LOPROX) 0.77 % cream Apply topically 2 (two) times daily. 05/01/21    Myna Bright M, PA-C  famotidine (PEPCID) 20 MG tablet Take 1 tablet (20 mg total) by mouth 2 (two) times daily. 02/10/20   Jaynee Eagles, PA-C  Ginger, Zingiber officinalis, (GINGER EXTRACT PO) Take by mouth.    [provider]  hydrocortisone cream 1 % Apply 1 application topically 2 (two) times daily. Apply to affected area under arm. 03/27/20   Autry-Lott, Naaman Plummer, DO  lamoTRIgine (LAMICTAL) 200 MG tablet Take 1 tablet (200 mg total) by mouth daily. Take 1 tablet (25 mg) in the morning & 2 tablets (50 mg) in the evening: For mood stabilization Patient taking differently: Take 200 mg by mouth at bedtime. Take 1 tablet (25 mg) in the morning & 2 tablets (50 mg) in the evening: For mood stabilizatio 11/24/17   Nicolette Bang, MD  levothyroxine (SYNTHROID) 112 MCG tablet TAKE 1 TABLET BY MOUTH DAILY 07/31/19   Meccariello, Bernita Raisin, DO  montelukast (SINGULAIR) 10 MG tablet Take 1 tablet (10 mg total) by mouth at bedtime. 10/29/19   Raylene Everts, MD  OVER THE COUNTER MEDICATION GOLI vitamin    [provider]  pregabalin (LYRICA) 50 MG capsule TAKE 1 CAPSULE BY MOUTH EVERY NIGHT AT BEDTIME FOR 7 NIGHTS THEN TAKE 1 CAPSULE BY MOUTH TWICE DAILY 01/22/20   [provider]  Prenatal Vit-Fe Fumarate-FA (PRENATAL MULTIVITAMIN) TABS tablet Take 1 tablet by mouth daily at 12 noon.    [provider]    Allergies    Kiwi extract  Review of Systems   Review of Systems  Constitutional:  Negative for chills and fever.  HENT:  Negative for ear pain and sore throat.   Eyes:  Negative for pain and visual disturbance.  Respiratory:  Negative for cough and shortness of breath.   Cardiovascular:  Negative for chest pain and palpitations.  Gastrointestinal:  Negative for abdominal pain and vomiting.  Genitourinary:  Positive for dysuria. Negative for hematuria.  Musculoskeletal:  Negative for arthralgias and back pain.  Skin:  Negative for color change and rash.   Neurological:  Negative for seizures and syncope.  All other systems reviewed and are negative.  Physical Exam Updated Vital Signs BP (!) 141/66 (BP Location: Right Arm)   Pulse 90   Temp 98.3 F (36.8 C) (Oral)   Resp 18   LMP 04/13/2021   SpO2 100%   Physical Exam Vitals and nursing note reviewed.  Constitutional:      General: She is not in acute distress.    Appearance: Normal appearance. She is well-developed.  HENT:     Head: Normocephalic and atraumatic.     Mouth/Throat:     Mouth: Mucous membranes are moist.  Comments: Tongue with a slight white coating.  Posterior pharynx with some slight erythema Eyes:     Extraocular Movements: Extraocular movements intact.     Conjunctiva/sclera: Conjunctivae normal.     Pupils: Pupils are equal, round, and reactive to light.  Cardiovascular:     Rate and Rhythm: Normal rate and regular rhythm.     Heart sounds: No murmur heard. Pulmonary:     Effort: Pulmonary effort is normal. No respiratory distress.     Breath sounds: Normal breath sounds. No wheezing, rhonchi or rales.  Abdominal:     Palpations: Abdomen is soft.     Tenderness: There is no abdominal tenderness.  Musculoskeletal:        General: Normal range of motion.     Cervical back: Neck supple.  Skin:    General: Skin is warm and dry.  Neurological:     General: No focal deficit present.     Mental Status: She is alert and oriented to person, place, and time.     Cranial Nerves: No cranial nerve deficit.     Sensory: No sensory deficit.     Motor: No weakness.    ED Results / Procedures / Treatments   Labs (all labs ordered are listed, but only abnormal results are displayed) Labs Reviewed  URINALYSIS, ROUTINE W REFLEX MICROSCOPIC - Abnormal; Notable for the following components:      Result Value   Hgb urine dipstick TRACE (*)    Protein, ur TRACE (*)    Leukocytes,Ua MODERATE (*)    Bacteria, UA FEW (*)    All other components within normal  limits    EKG None  Radiology No results found.  Procedures Procedures   Medications Ordered in ED Medications - No data to display  ED Course  I have reviewed the triage vital signs and the nursing notes.  Pertinent labs & imaging results that were available during my care of the patient were reviewed by me and considered in my medical decision making (see chart for details).    MDM Rules/Calculators/A&P                           Patient nontoxic no acute distress.  Urinalysis negative for urinary tract infection.  Hydrate yourself well.  Follow-up for any new or worse symptoms.  Work note provided.   Final Clinical Impression(s) / ED Diagnoses Final diagnoses:  Dysuria    Rx / DC Orders ED Discharge Orders     None        Fredia Sorrow, MD 05/08/21 1203

## 2021-05-08 NOTE — ED Notes (Signed)
Patient verbalizes understanding of discharge instructions. Opportunity for questioning and answers were provided. Patient discharged from ED.  °

## 2021-05-10 ENCOUNTER — Ambulatory Visit (HOSPITAL_COMMUNITY)
Admission: EM | Admit: 2021-05-10 | Discharge: 2021-05-11 | Disposition: A | Discharge status: Home / Self Care | Payer: Federal, State, Local not specified - PPO | Attending: Behavioral Health | Admitting: Behavioral Health

## 2021-05-10 DIAGNOSIS — F22 Delusional disorders: Secondary | ICD-10-CM | POA: Diagnosis not present

## 2021-05-10 DIAGNOSIS — Z20822 Contact with and (suspected) exposure to covid-19: Secondary | ICD-10-CM | POA: Diagnosis not present

## 2021-05-10 DIAGNOSIS — F603 Borderline personality disorder: Secondary | ICD-10-CM | POA: Insufficient documentation

## 2021-05-10 DIAGNOSIS — Z79899 Other long term (current) drug therapy: Secondary | ICD-10-CM | POA: Insufficient documentation

## 2021-05-10 DIAGNOSIS — Z91128 Patient's intentional underdosing of medication regimen for other reason: Secondary | ICD-10-CM | POA: Diagnosis not present

## 2021-05-10 DIAGNOSIS — F319 Bipolar disorder, unspecified: Secondary | ICD-10-CM | POA: Diagnosis not present

## 2021-05-10 DIAGNOSIS — F259 Schizoaffective disorder, unspecified: Secondary | ICD-10-CM | POA: Insufficient documentation

## 2021-05-10 DIAGNOSIS — R45851 Suicidal ideations: Secondary | ICD-10-CM | POA: Diagnosis not present

## 2021-05-10 DIAGNOSIS — F411 Generalized anxiety disorder: Secondary | ICD-10-CM | POA: Diagnosis not present

## 2021-05-10 LAB — CBC WITH DIFFERENTIAL/PLATELET
Abs Immature Granulocytes: 0.02 10*3/uL (ref 0.00–0.07)
Basophils Absolute: 0 10*3/uL (ref 0.0–0.1)
Basophils Relative: 0 %
Eosinophils Absolute: 0.1 10*3/uL (ref 0.0–0.5)
Eosinophils Relative: 2 %
HCT: 40.4 % (ref 36.0–46.0)
Hemoglobin: 12.7 g/dL (ref 12.0–15.0)
Immature Granulocytes: 0 %
Lymphocytes Relative: 37 %
Lymphs Abs: 2.6 10*3/uL (ref 0.7–4.0)
MCH: 26.5 pg (ref 26.0–34.0)
MCHC: 31.4 g/dL (ref 30.0–36.0)
MCV: 84.2 fL (ref 80.0–100.0)
Monocytes Absolute: 0.5 10*3/uL (ref 0.1–1.0)
Monocytes Relative: 7 %
Neutro Abs: 3.7 10*3/uL (ref 1.7–7.7)
Neutrophils Relative %: 54 %
Platelets: 386 10*3/uL (ref 150–400)
RBC: 4.8 MIL/uL (ref 3.87–5.11)
RDW: 13.6 % (ref 11.5–15.5)
WBC: 7 10*3/uL (ref 4.0–10.5)
nRBC: 0 % (ref 0.0–0.2)

## 2021-05-10 LAB — RESP PANEL BY RT-PCR (FLU A&B, COVID) ARPGX2
Influenza A by PCR: NEGATIVE
Influenza B by PCR: NEGATIVE
SARS Coronavirus 2 by RT PCR: NEGATIVE

## 2021-05-10 LAB — COMPREHENSIVE METABOLIC PANEL
ALT: 20 U/L (ref 0–44)
AST: 22 U/L (ref 15–41)
Albumin: 4.4 g/dL (ref 3.5–5.0)
Alkaline Phosphatase: 61 U/L (ref 38–126)
Anion gap: 9 (ref 5–15)
BUN: <5 mg/dL — ABNORMAL LOW (ref 6–20)
CO2: 26 mmol/L (ref 22–32)
Calcium: 10.4 mg/dL — ABNORMAL HIGH (ref 8.9–10.3)
Chloride: 101 mmol/L (ref 98–111)
Creatinine, Ser: 0.86 mg/dL (ref 0.44–1.00)
GFR, Estimated: >60 mL/min (ref >60)
Glucose, Bld: 93 mg/dL (ref 70–99)
Potassium: 4.2 mmol/L (ref 3.5–5.1)
Sodium: 136 mmol/L (ref 135–145)
Total Bilirubin: 0.7 mg/dL (ref 0.3–1.2)
Total Protein: 7.4 g/dL (ref 6.5–8.1)

## 2021-05-10 LAB — HEMOGLOBIN A1C
Hgb A1c MFr Bld: 5.5 % (ref 4.8–5.6)
Mean Plasma Glucose: 111.15 mg/dL

## 2021-05-10 LAB — POCT URINE DRUG SCREEN - MANUAL ENTRY (I-SCREEN)
POC Amphetamine UR: NOT DETECTED
POC Buprenorphine (BUP): NOT DETECTED
POC Cocaine UR: NOT DETECTED
POC Marijuana UR: NOT DETECTED
POC Methadone UR: NOT DETECTED
POC Methamphetamine UR: NOT DETECTED
POC Morphine: NOT DETECTED
POC Oxazepam (BZO): NOT DETECTED
POC Oxycodone UR: NOT DETECTED
POC Secobarbital (BAR): NOT DETECTED

## 2021-05-10 LAB — ETHANOL: Alcohol, Ethyl (B): <10 mg/dL (ref <10)

## 2021-05-10 LAB — POCT PREGNANCY, URINE: Preg Test, Ur: NEGATIVE

## 2021-05-10 LAB — TSH: TSH: 3.69 u[IU]/mL (ref 0.350–4.500)

## 2021-05-10 MED ORDER — MAGNESIUM HYDROXIDE 400 MG/5ML PO SUSP
30.0000 mL | Freq: Every day | ORAL | Status: DC | PRN
Start: 2021-05-10 — End: 2021-05-12

## 2021-05-10 MED ORDER — ACETAMINOPHEN 325 MG PO TABS
650.0000 mg | ORAL_TABLET | Freq: Four times a day (QID) | ORAL | Status: DC | PRN
  Administered 2021-05-10: 650 mg via ORAL
  Filled 2021-05-10: qty 2

## 2021-05-10 MED ORDER — TRAZODONE HCL 50 MG PO TABS
50.0000 mg | ORAL_TABLET | Freq: Every evening | ORAL | Status: DC | PRN
  Administered 2021-05-10: 50 mg via ORAL
  Filled 2021-05-10: qty 1

## 2021-05-10 MED ORDER — ALUM & MAG HYDROXIDE-SIMETH 200-200-20 MG/5ML PO SUSP
30.0000 mL | ORAL | Status: DC | PRN
Start: 2021-05-10 — End: 2021-05-12

## 2021-05-10 MED ORDER — HYDROXYZINE HCL 25 MG PO TABS
25.0000 mg | ORAL_TABLET | Freq: Three times a day (TID) | ORAL | Status: DC | PRN
Start: 2021-05-10 — End: 2021-05-12
  Administered 2021-05-10 – 2021-05-11 (×2): 25 mg via ORAL
  Filled 2021-05-10 (×2): qty 1

## 2021-05-10 NOTE — ED Notes (Signed)
Unable to do EKG at this time due to patient being too upset. Will try again when she has calmed down

## 2021-05-10 NOTE — Progress Notes (Signed)
Colleen Peck got up to make a phone call and was assisted by the MHT. She is crying, trembling  and having difficulty communicating her feeling. It was suggested she try again in the morning to make her call.

## 2021-05-10 NOTE — Progress Notes (Signed)
Received Apryll this PM in the exam room where the MHT was attempting to perform an EKG. The patient was frighten although her sister was present and took the EKG leads off. The procedure was terminated and she was taken to the unit. She immediately was able to calm herself down and take medication. She continued to have 2  brief episodes of crying. Later she was able to talk with her peer. The skin search was performed and her personal belongings secured. She was oriented to her new environment and offered nourishments.

## 2021-05-10 NOTE — ED Provider Notes (Signed)
Behavioral Health Admission [facility] & OBS)  Date: 05/10/21 Patient Name: Colleen Peck MRN: [id number]   Diagnoses:  Final diagnoses:  Suicidal ideation    HPI: Patient presents to the [facility] voluntarily as a walk-in accompanied by sister with a chief complaint of "SI."   Patient seen and examined face to face by this provider with her sister present, and chart reviewed. On evaluation, patient is alert and oriented x4. Her thought process is logical with paranoia thought content. Her speech is decreased and she appears to have some thought blocking. She has poor eye contact and is easily distracted. Her mood is depressed and affect is congruent. She is noted to be crying during the assessment and states that she is scared. She is somewhat difficult to assess as she provides 1-2 word responses and begins sobbing. She is a poor historian and provides limited information.   She reports passive suicidal thoughts with no plan or intent. She states, "I am not going to do it."  She reports having suicidal thoughts "sometimes."  She is unable to further elaborate on when the thoughts started. She denies HI. When asked if she is hearing voices that other people cannot hear, she states, "sometimes." She is unable to described the voices. She denies VH. She reports feeling paranoid "sometimes." She is unable to describe the paranoia.   She reports that she follows up with Dr. Jacolyn Reedy for medication management but is unable to recall the name of the outpatient her psychiatrist is affiliated with. She states that she is prescribed medications for her mental health but is unable to recall the name of her current medications. She reports that she stopped taking her medications one month aqo. She states that she follows up with Clent Ridges for therapy at [place]. She resides alone. She is employed as a Scientist, clinical (histocompatibility and immunogenetics).   The patient's sister states that the patient  discussed with her psychiatrist wanting to become pregnant. She state that the psychiatrist was planning to adjust her medications.    PHQ 2-9:  [place] ED from 05/10/2021 in [facility] Office Visit from 03/27/2020 in [place] Office Visit from 02/04/2020 in [place]  Thoughts that you would be better off dead, or of hurting yourself in some way More than half the days Not at all Not at all  PHQ-9 Total Score 14 6 2        [place] ED from 05/10/2021 in [facility] ED from 05/01/2021 in [facility]  C-SSRS RISK CATEGORY High Risk No Risk        Total Time spent with patient: 30 minutes  Musculoskeletal  Strength & Muscle Tone: within normal limits Gait & Station: normal Patient leans: N/A  Psychiatric Specialty Exam  Presentation General Appearance: Casual  Eye Contact:Minimal  Speech:Slow  Speech Volume:Decreased  Handedness:No data recorded  Mood and Affect  Mood:Depressed  Affect:Congruent   Thought Process  Thought Processes:Coherent  Descriptions of Associations:Intact  Orientation:Full (Time, Place and Person)  Thought Content:Paranoid Ideation    Hallucinations:Hallucinations: Auditory  Ideas of Reference:Paranoia  Suicidal Thoughts:Suicidal Thoughts: Yes, Passive SI Passive Intent and/or Plan: Without Intent; Without Plan  Homicidal Thoughts:Homicidal Thoughts: No   Sensorium  Memory:Immediate Fair; Recent Fair; Remote [place]   Executive Functions  Concentration:Poor  Attention Span:Poor  Recall:Poor  Fund of Knowledge:Fair  Language:Fair   Psychomotor Activity  Psychomotor Activity:Psychomotor Activity: Normal   Assets  Assets:Communication Skills; Desire for Improvement; Housing; Leisure Time   Physical Exam HENT:     Nose: Nose normal.   Eyes:     Conjunctiva/sclera: Conjunctivae normal.  Cardiovascular:     Rate and Rhythm: Normal rate.  Pulmonary:     Effort: Pulmonary effort is normal.  Musculoskeletal:        General: Normal range of motion.     Cervical back: Normal range of motion.  Neurological:     Mental Status: She is alert and oriented to person, place, and time.   Review of Systems  Constitutional: Negative.   HENT: Negative.    Eyes: Negative.   Respiratory: Negative.    Cardiovascular: Negative.   Gastrointestinal:  Positive for abdominal pain.       "Menstrual cramps"  Musculoskeletal: Negative.   Skin: Negative.   Neurological: Negative.   Endo/Heme/Allergies: Negative.   Psychiatric/Behavioral:  Positive for hallucinations.    Blood pressure 115/82, pulse 86, temperature 98.6 F (37 C), temperature source Oral, resp. rate 18, last menstrual period 04/13/2021, SpO2 100 %. There is no height or weight on file to calculate BMI.  Past Psychiatric History: Patient has a charted history of schizoaffective disorder-bipolar type, anxiety, paranoia, and borderline personality. Prior hospitalizations  2 at [facility] in 2017 and once in 2016.  Patient previously prescribed Lamictal, Prozac, risperidone, trazodone, Vistaril, Cogentin, Trileptal, risperidone, and Risperdal Consta.  Is the patient at risk to self? Yes  Has the patient been a risk to self in the past 6 months? No .    Has the patient been a risk to self within the distant past? unknown  Is the patient a risk to others? No   Has the patient been a risk to others in the past 6 months? No   Has the patient been a risk to others within the distant past? No   Past Medical History:  Past Medical History:  Diagnosis Date   Abnormal pap    pt reports abnl pap many years ago.  Nl since then.   Allergy    seasonal   Anemia    Asthma    Bipolar disorder ([place])    Depression    Palpitations 03/12/2008   PTSD (post-traumatic stress disorder)     Seasonal allergies    Thyroid disease 2009   Graves disease (pt reported resolved); hypothyriodism    Past Surgical History:  Procedure Laterality Date   DILATION AND CURETTAGE OF UTERUS  March 2006   LYMPH NODE BIOPSY Right 11/23/2019   Procedure: EXCISIONAL BIOPSY OF RIGHT NECK NODE;  Surgeon: Rozetta Nunnery, MD;  Location: [place];  Service: ENT;  Laterality: Right;    Family History:  Family History  Problem Relation Age of Onset   Drug abuse Father    Depression Maternal Grandmother    Anxiety disorder Maternal Grandmother    COPD Maternal Grandmother    Suicidality Cousin    Depression Cousin    Bipolar disorder Cousin    Hypertension Mother    Depression Mother    Diabetes Paternal Grandfather    COPD Paternal Grandmother    Depression Maternal Aunt    Breast cancer Maternal Aunt    Depression Maternal Aunt    Heart disease Neg Hx     Social History:  Social History   Socioeconomic History   Marital status: Single    Spouse name: Not on file   Number of  children: Not on file   Years of education: Not on file   Highest education level: Not on file  Occupational History   Not on file  Tobacco Use   Smoking status: Never   Smokeless tobacco: Never  Vaping Use   Vaping Use: Never used  Substance and Sexual Activity   Alcohol use: Yes    Comment: occ   Drug use: Not Currently    Types: Marijuana    Comment: past use of marijuana in '08-'09. 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   Social Determinants of Health   Financial Resource Strain: Not on file  Food Insecurity: Not on file  Transportation Needs: Not on file  Physical Activity: Not on file  Stress: Not on file  Social Connections: Not on file   Intimate Partner Violence: Not on file    SDOH:  SDOH Screenings   Alcohol Screen: Not on file  Depression (PHQ2-9): Medium Risk   PHQ-2 Score: 14  Financial Resource Strain: Not on file  Food Insecurity: Not on file  Housing: Not on file  Physical Activity: Not on file  Social Connections: Not on file  Stress: Not on file  Tobacco Use: Low Risk    Smoking Tobacco Use: Never   Smokeless Tobacco Use: Never   Passive Exposure: Not on file  Transportation Needs: Not on file    Last Labs:  Admission on 05/08/2021, Discharged on 05/08/2021  Component Date Value Ref Range Status   Color, Urine 05/08/2021 YELLOW  YELLOW Final   APPearance 05/08/2021 CLEAR  CLEAR Final   Specific Gravity, Urine 05/08/2021 1.024  1.005 - 1.030 Final   pH 05/08/2021 5.5  5.0 - 8.0 Final   Glucose, UA 05/08/2021 NEGATIVE  NEGATIVE mg/dL Final   Hgb urine dipstick 05/08/2021 TRACE (A)  NEGATIVE Final   Bilirubin Urine 05/08/2021 NEGATIVE  NEGATIVE Final   Ketones, ur 05/08/2021 NEGATIVE  NEGATIVE mg/dL Final   Protein, ur 05/08/2021 TRACE (A)  NEGATIVE mg/dL Final   Nitrite 05/08/2021 NEGATIVE  NEGATIVE Final   Leukocytes,Ua 05/08/2021 MODERATE (A)  NEGATIVE Final   RBC / HPF 05/08/2021 0-5  0 - 5 RBC/hpf Final   WBC, UA 05/08/2021 0-5  0 - 5 WBC/hpf Final   Bacteria, UA 05/08/2021 FEW (A)  NONE SEEN Final   Squamous Epithelial / LPF 05/08/2021 0-5  0 - 5 Final   Mucus 05/08/2021 PRESENT   Final   Performed at [facility], [address] [place], [place] [address]    Allergies: Kiwi extract  PTA Medications: (Not in a hospital admission)   Medical Decision Making  Patient admitted to the [facility] continuous assessment for mood stabilization and safety. Patient is voluntary.   Lab Orders         Resp Panel by RT-PCR (Flu A&B, Covid) Nasopharyngeal Swab         CBC with Differential/Platelet         Comprehensive metabolic panel         Hemoglobin A1c         Ethanol          TSH         Pregnancy, urine         POC SARS  Coronavirus 2 Ag-ED - Nasal Swab         POCT Urine Drug Screen - ([facility])         EKG  Meds ordered this encounter  Medications   acetaminophen (TYLENOL) tablet 650 mg   alum & mag hydroxide-simeth (MAALOX/MYLANTA) 200-200-20 MG/5ML suspension 30 mL   magnesium hydroxide (MILK OF MAGNESIA) suspension 30 mL   hydrOXYzine (ATARAX/VISTARIL) tablet 25 mg   traZODone (DESYREL) tablet 50 mg        Recommendations  Based on my evaluation the patient does not appear to have an emergency medical condition.  Marissa Calamity, NP 05/10/21  6:51 PM

## 2021-05-10 NOTE — BH Assessment (Addendum)
Comprehensive Clinical Assessment (CCA) Note  05/10/2021 Colleen Peck [id number]  DISPOSITION: Per Darrol Angel NP, pt is recommended for overnight continuous observation and re-assessment tomorrow.   The patient demonstrates the following risk factors for suicide: Chronic risk factors for suicide include: psychiatric disorder of Schizophreaffective d/o . Acute risk factors for suicide include: N/A. Protective factors for this patient include: positive social support, positive therapeutic relationship, and hope for the future. Considering these factors, the overall suicide risk at this point appears to be high. Patient is appropriate for outpatient follow up.  [place] ED from 05/10/2021 in [facility] ED from 05/01/2021 in [place] [facility]  C-SSRS RISK CATEGORY High Risk No Risk      Pt is a 40 yo female who was brought in by her sister, Colleen Peck, voluntarily due to worsening symptoms of psychosis and depression. Pt gave verbal permission for her sister to be present and participate in the assessment. Per chart, pt has a hx of schizoaffective d/o, Bipolar i d/o, GAD and BPD. Pt admitted SI during the assessment and last night. Pt endorsed SI repeatedly saying softly throughout the assessment "I wanna die." She did not name any specific plan and denied intent to act. Pt stated several times that she was "not gonna do it." Pt reported multiple IP psych admission with the last one occurring in 2017 at [facility]. Pt was disorganized in her thinking and lacking in her ability to track with conversation. Pt was not able to understand directions given or simple concepts explained at times. Pt was tearful throughout. Pt denied HI, NSSH but reported hearing voices "sometimes" and paranoid symptoms "sometimes." Pt stated she was hearing voices "sometimes: and reported hearing voices during the assessment. Pt stated she was "scared" each time  she was asked about the voices. At one point, when asked, pt reported hearing voices during the assessment telling her not to be truthful. She repeatedly said she was scared when asked about the voices she was hearing. Pt stated she had access to a gun and her sister agreed to arrange for it to be secured away from her with her permission. Pt stated she was seeing Dr. Clent Ridges of [facility] for therapy and Dr. Jacolyn Reedy for medication management. Pt stated that she had stopped her prescribed medications more than a month ago due to her desire to become pregnant. It is unclear if this was in consultation with her doctor or not. Pt lives alone in [place] and is employed as a Scientist, clinical (histocompatibility and immunogenetics).  Pt was casually dressed and adequately groomed. Pt's mood was depressed and her affect was congruent. She was tearful throughout. Pt's thinking was disorganized and at times she was not able to track with the conversation or questions asked. Pt's speech was soft and she made many statements softly under her breath. Pt was oriented x 4 and alert although highly distractable and at times scattered. Pt stated she had trouble sleeping "sometimes."   Chief Complaint:  Chief Complaint  Patient presents with   Suicidal   Schizophrenia   Visit Diagnosis:  Schizoaffective d/o GAD    CCA Screening, Triage and Referral (STR)  Patient Reported Information How did you hear about [place]? Family/Friend  What Is the Reason for Your Visit/Call Today? Pt is a 40 yo female who was brought in by her sister, Colleen Peck, voluntarily due to worsening symptoms of psychosis and depression. Pt gave verbal permission for her sister to be present and  participate in the assessment. Per chart, pt has a hx of schizoaffective d/o, Bipolar i d/o, GAD and BPD. Pt endorsed SI repeatedly saying softly throughout the assessment "I wanna die." She did not name any specific plan and denied intent to act. Pt reported multiple IP psych  admission with the last one occurring in 2017 at [facility]. Pt was disorganized in her thinking and lacking in her ability to track with conversation. Pt was not able to understand directions given or simple concepts explained at times. Pt was tearful throughout. Pt denied HI, NSSH but reported hearing voices "sometimes" and paranoid symptoms "sometimes." Pt reported hearing voices during the assessment telling her not to be truthful. She repeatedly said she was scared when asked about the voices she was hearing. Pt stated she had access to a gun and her sister agreed to arrange for it to be secured away from her with her permission. Pt stated she was seeing Dr. Clent Ridges of [facility] for therapy and Dr. Jacolyn Reedy for medication management. Pt stated that she had stopped her prescribed medications more than a month ago due to her desire to become pregnant. It is unclear if this was in consultation with her doctor or not. Pt lives alone in [place] and is employed as a Scientist, clinical (histocompatibility and immunogenetics).  How Long Has This Been Causing You Problems? 1 wk - 1 month  What Do You Feel Would Help You the Most Today? Treatment for Depression or other mood problem   Have You Recently Had Any Thoughts About Hurting Yourself? Yes  Are You Planning to Commit Suicide/Harm Yourself At This time? No (Pt stated several times that she was "not gonna do it.")   Have you Recently Had Thoughts About [place]? No  Are You Planning to Harm Someone at This Time? No  Explanation: No data recorded  Have You Used Any Alcohol or Drugs in the Past 24 Hours? No  How Long Ago Did You Use Drugs or Alcohol? No data recorded What Did You Use and How Much? No data recorded  Do You Currently Have a Therapist/Psychiatrist? No data recorded Name of Therapist/Psychiatrist: No data recorded  Have You Been Recently Discharged From Any Office Practice or Programs? No data recorded Explanation of Discharge From Practice/Program:  No data recorded    CCA Screening Triage Referral Assessment Type of Contact: No data recorded Telemedicine Service Delivery:   Is this Initial or Reassessment? No data recorded Date Telepsych consult ordered in [facility]:  No data recorded Time Telepsych consult ordered in [facility]:  No data recorded Location of Assessment: No data recorded Provider Location: No data recorded  Collateral Involvement: none   Does Patient Have a [place]? No data recorded Name and Contact of Legal Guardian: No data recorded If Minor and Not Living with Parent(s), Who has Custody? No data recorded Is CPS involved or ever been involved? No data recorded Is APS involved or ever been involved? No data recorded  Patient Determined To Be At Risk for Harm To Self or Others Based on Review of Patient Reported Information or Presenting Complaint? No data recorded Method: No data recorded Availability of Means: No data recorded Intent: No data recorded Notification Required: No data recorded Additional Information for Danger to Others Potential: No data recorded Additional Comments for Danger to Others Potential: No data recorded Are There Guns or Other Weapons in Your Home? No data recorded Types of Guns/Weapons: No data recorded Are These Weapons Safely Secured?  No data recorded Who Could Verify You Are Able To Have These Secured: No data recorded Do You Have any Outstanding Charges, Pending Court Dates, Parole/Probation? No data recorded Contacted To Inform of Risk of Harm To Self or Others: No data recorded   Does Patient Present under Involuntary Commitment? No data recorded IVC Papers Initial File Date: No data recorded  [place] of Residence: No data recorded  Patient Currently Receiving the Following Services: No data recorded  Determination of Need: Urgent (48 hours)   Options For Referral: [facility]     CCA Biopsychosocial Patient Reported  Schizophrenia/Schizoaffective Diagnosis in Past: Yes   Strengths: uta   Mental Health Symptoms Depression:   Change in energy/activity; Difficulty Concentrating; Hopelessness; Tearfulness   Duration of Depressive symptoms:  Duration of Depressive Symptoms: Greater than two weeks   Mania:   Racing thoughts; None   Anxiety:    Worrying; Restlessness   Psychosis:   Grossly disorganized or catatonic behavior; Other negative symptoms; Affective flattening/alogia/avolition   Duration of Psychotic symptoms:  Duration of Psychotic Symptoms: Less than six months   Trauma:   None   Obsessions:   None   Compulsions:   None   Inattention:   Disorganized; Does not follow instructions (not oppositional); Does not seem to listen   Hyperactivity/Impulsivity:   None   Oppositional/Defiant Behaviors:   N/A   Emotional Irregularity:   Recurrent suicidal behaviors/gestures/threats; Mood lability   Other Mood/Personality Symptoms:   uta    Mental Status Exam Appearance and self-care  Stature:   Average   Weight:   Overweight   Clothing:   Casual   Grooming:   Normal   Cosmetic use:   Inappropriate for age   Posture/gait:   Normal   Motor activity:   Restless   Sensorium  Attention:   Distractible; Confused   Concentration:   Anxiety interferes; Scattered   Orientation:   Person; Place; Situation; Time   Recall/memory:   Defective in Immediate; Defective in Remote   Affect and Mood  Affect:   Depressed; Tearful   Mood:   Depressed   Relating  Eye contact:   Fleeting   Facial expression:   Depressed; Fearful   Attitude toward examiner:   Cooperative; Guarded; Suspicious   Thought and Language  Speech flow:  Clear and Coherent; Paucity; Soft   Thought content:   Appropriate to Mood and Circumstances   Preoccupation:   -- Pincus Badder)   Hallucinations:   Auditory   Organization:  No data recorded  [employer] of  Knowledge:   -- Pincus Badder)   Intelligence:  No data recorded  Abstraction:   Concrete   Judgement:   Impaired   Reality Testing:   Distorted   Insight:   Lacking   Decision Making:   Confused; Only simple   Social Functioning  Social Maturity:   -- Pincus Badder)   Social Judgement:   -- Pincus Badder)   Stress  Stressors:   -- (uta)   Coping Ability:   Overwhelmed; Exhausted   Skill Deficits:   Self-care   Supports:   Friends/Service system; Family     Religion: Religion/Spirituality Are You A 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)  Leisure/Recreation: Leisure / Recreation Do You Have Hobbies?:  Pincus Badder)  Exercise/Diet: Exercise/Diet Do You Exercise?:  (uta) Have You Gained or Lost A Significant Amount of Weight in the Past Six Months?:  (uta) Do You Follow a Special Diet?:  (uta) Do You Have Any Trouble  Sleeping?: Yes Explanation of Sleeping Difficulties: Pt stated she had trouble sleeping "sometimes."   CCA Employment/Education Employment/Work Situation: Employment / Work Situation Employment Situation: Employed Has Patient ever Been in Passenger transport manager?: No  Education: Education Is Patient Currently Attending School?: No Last Grade Completed: 12 Did You Nutritional therapist?: Yes What Type of College Degree Do you Have?: Bachelor's degree and some graduate work completed. Did You Have An Individualized Education Program (IIEP): No Did You Have Any Difficulty At School?: No   CCA Family/Childhood History Family and Relationship History: Family history Marital status: Single Does patient have children?: No  Childhood History:  Childhood History By whom was/is the patient raised?: Mother/father and step-parent Did patient suffer any verbal/emotional/physical/sexual abuse as a child?: No Did patient suffer from severe childhood neglect?: No Has patient ever been sexually abused/assaulted/raped as an adolescent or adult?: No Was the patient ever a victim of a crime or a disaster?:  No Witnessed domestic violence?: No Has patient been affected by domestic violence as an adult?: No  Child/Adolescent Assessment:     CCA Substance Use Alcohol/Drug Use: Alcohol / Drug Use Pain Medications: see MAR Prescriptions: see MAR Over the Counter: see MAR History of alcohol / drug use?: No history of alcohol / drug abuse                         ASAM's:  Six Dimensions of Multidimensional Assessment  Dimension 1:  Acute Intoxication and/or Withdrawal Potential:      Dimension 2:  Biomedical Conditions and Complications:      Dimension 3:  Emotional, Behavioral, or Cognitive Conditions and Complications:     Dimension 4:  Readiness to Change:     Dimension 5:  Relapse, Continued use, or Continued Problem Potential:     Dimension 6:  Recovery/Living Environment:     ASAM Severity Score:    ASAM Recommended Level of Treatment:     Substance use Disorder (SUD)    Recommendations for Services/Supports/Treatments:    Discharge Disposition:    DSM5 Diagnoses: Patient Active Problem List   Diagnosis Date Noted   Acute bilateral knee pain 02/04/2020   Chest pain 10/15/2019   Lump on neck 09/30/2019   Sciatic nerve pain, left 07/16/2019   Close exposure to COVID-19 virus 07/16/2019   Acute back pain with radiculopathy 01/22/2019   Onycholysis 11/14/2018   Palpable lymph node 93/71/6967   Cyclical mastalgia 89/38/1017   PMS (premenstrual syndrome) 08/20/2018   Vertigo 05/31/2018   Dermatofibroma of right lower leg 01/06/2017   History of borderline personality disorder 01/01/2016   Carotidynia 12/16/2015   Schizoaffective disorder, bipolar type ([place]) 11/26/2015   Anxiety    PTSD (post-traumatic stress disorder) 09/30/2015   Routine screening for STI (sexually transmitted infection)    Paranoia ([place])    Hypothyroidism 02/26/2015   Anemia 01/30/2015   Vitamin D deficiency 05/22/2013   Skin rash 03/05/2013   SOB (shortness of breath) on exertion  07/13/2012   Obesity 09/08/2006   Allergic rhinitis 09/08/2006   Asthma 09/08/2006   ECZEMA, ATOPIC DERMATITIS 09/08/2006     Referrals to Alternative Service(s): Referred to Alternative Service(s):   Place:   Date:   Time:    Referred to Alternative Service(s):   Place:   Date:   Time:    Referred to Alternative Service(s):   Place:   Date:   Time:    Referred to Alternative Service(s):   Place:   Date:  Time:     Fuller Mandril, Counselor  Loyal Jacobson. Mare Ferrari, [place], [facility], [facility] Triage Specialist [facility]

## 2021-05-10 NOTE — Progress Notes (Signed)
05/10/21 1754  [place] (Walk-ins at [facility] only)  How Did You Hear About [place]? Family/Friend  What Is the Reason for Your Visit/Call Today? Pt is a 40 yo female who was brought in by her sister, Giovanna Kemmerer, voluntarily due to worsening symptoms of psychosis and depression. Pt gave verbal permission for her sister to be present and participate in the assessment. Per chart, pt has a hx of schizoaffective d/o, Bipolar i d/o, GAD and BPD. Pt endorsed SI repeatedly saying softly throughout the assessment "I wanna die." She did not name any specific plan and denied intent to act. Pt reported multiple IP psych admission with the last one occurring in 2017 at [facility]. Pt was disorganized in her thinking and lacking in her ability to track with conversation. Pt was not able to understand directions given or simple concepts explained at times. Pt was tearful throughout. Pt denied HI, NSSH but reported hearing voices "sometimes" and paranoid symptoms "sometimes." Pt reported hearing voices during the assessment telling her not to be truthful. She repeatedly said she was scared when asked about the voices she was hearing. Pt stated she had access to a gun and her sister agreed to arrange for it to be secured away from her with her permission. Pt stated she was seeing Dr. Clent Ridges of [facility] for therapy and Dr. Jacolyn Reedy for medication management. Pt stated that she had stopped her prescribed medications more than a month ago due to her desire to become pregnant. It is unclear if this was in consultation with her doctor or not. 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  How Long Has This Been Causing You Problems? 1 wk - 1 month  Have You Recently Had Any Thoughts About Hurting Yourself? Yes  How long ago did you have thoughts about hurting yourself? Pt admitted SI during the assessment and last night.  Are You Planning to Commit Suicide/Harm Yourself At This time?  No (Pt stated several times that she was "not gonna do it.")  Have you Recently Had Thoughts About [place]? No  Are You Planning To Harm Someone At This Time? No  Are you currently experiencing any auditory, visual or other hallucinations? Yes  Please explain the hallucinations you are currently experiencing: Pt stated she was hearing voices "sometimes: and reported hearing voices during the assessment. Pt stated she was "scared" each time she was asked about the voices.  Have You Used Any Alcohol or Drugs in the Past 24 Hours? No  Do you have any current medical co-morbidities that require immediate attention? Yes  Please describe current medical co-morbidities that require immediate attention: Pt reported some pain from her menstral period. Pt's sister also reported a hx of Grave's disease.  Clinician description of patient physical appearance/behavior: Pt was casually dressed and adequately groomed. Pt's mood was depressed and her affect was congruent. She was tearful throughout. Pt's thinking was disorganized and at times she was not able to track with the conversation or questions asked. Pt's speech was soft and she made many statements softly under her breath. Pt was oriented x 4 and alert although highly distractable and at times scattered.  What Do You Feel Would Help You the Most Today? Treatment for Depression or other mood problem  If access to [facility] was not available, would you have sought care in the Emergency Department? Yes  Determination of Need Urgent (48 hours)  Options For Referral  [facility]  Makensey Rego T. Mare Ferrari, [place], [facility], [facility] Triage Specialist [facility]

## 2021-05-11 DIAGNOSIS — F319 Bipolar disorder, unspecified: Secondary | ICD-10-CM | POA: Diagnosis not present

## 2021-05-11 MED ORDER — LORAZEPAM 0.5 MG PO TABS
0.5000 mg | ORAL_TABLET | Freq: Once | ORAL | Status: AC
  Administered 2021-05-11: 0.5 mg via ORAL
  Filled 2021-05-11: qty 1

## 2021-05-11 NOTE — ED Notes (Signed)
Pt at nurses desk asking questions regarding adding her therapist to her list to be contacted, breakfast, plan for the day, confused about what day she came in and what today is and using the phone. Staff answered each question appropriately. Breakfast provided but Pt decided to not eat or drink the items given to her. Informed Pt that a provider would be in to talk to her soon after the morning meeting. Pt questioned if she could receive her synthroid. Informed Pt that she does not have an order for Synthroid but this nurse would inform the provider. A provider was given a message via secure chat for this request. Pt often stands at the nurses desk without saying anything and stares at the staff. When staff speaks to the Pt she often does not answer or does not answer for a brief amount of time. Becomes teary eyed easily and is confused regarding when she came into the facility. Was under the impression that she came in on Saturday. Was able to convince Pt that she did not come Saturday but came on Sunday evening with her sister. Admin PRN Viteral per orders, safety maintained and will continue to monitor.

## 2021-05-11 NOTE — Progress Notes (Signed)
Colleen Peck got up to use the bathroom and had to be told a second time where the bathroom is located. She stood at the bathroom door and did not enter. She turned around and asked for something to eat. Her request was granted.  She did not eat the food she requested She sat up in the chair by her chair bed and rocked back and forth with one loud scream.

## 2021-05-11 NOTE — ED Notes (Signed)
Pt stated that she was no longer hearing voices or felt suicidal. Pt did state that she feels depressed. Informed Pt to make sure to inform the provider of how she felt today when they came to assess her this morning.

## 2021-05-11 NOTE — ED Notes (Signed)
Patient offered several items for dinner. Patient decided on beef stew then stated she did not want it. MHT placed it to side in case she changed her mind later. Patient was informed that she will not be given another choice due to possibility of running out  of food and to avoid waste. Patient has asked for several food selections but did not eat any of her choices.

## 2021-05-11 NOTE — ED Notes (Signed)
Pt at nurses desk. Stated, "The medicine is doing the opposite". Regarding the Vistaril. Encouraged Pt to try and relax. Informed Pt that the providers were out of their meeting and would be down to see her shortly.

## 2021-05-11 NOTE — ED Notes (Signed)
Pt requested the form "Consent to Release Information" to fill out for her PCP to be contacted. Form provided and then Pt changed her mind about filling the form out. Informed Pt that the form would be placed in her chart if she changed her mind and decided to fill it out.

## 2021-05-11 NOTE — ED Provider Notes (Signed)
FBC/OBS ASAP Discharge Summary  Date and Time: 05/11/2021 7:00 PM  Name: Colleen Peck  MRN:  [id number]   Discharge Diagnoses:  Final diagnoses:  Suicidal ideation    Subjective: Colleen Peck, 40 y.o., female patient seen face to face by this provider, chart reviewed and discussed with Dr. Serafina Mitchell on 05/11/21.  Patient has a history of schizoaffective disorder, bipolar 1 disorder, GAD, and BPD.  She has a history of multiple inpatient psychiatric admissions, last admission was in 2017 at [place].  On evaluation Colleen Peck is laying in her bed asleep. She is easily awakened. She is withdrawn and guarded with questions.  She is tearful at times.  She is alert and oriented x 4.  She is cooperative and is able to follow directions.  Her speech is clear, coherent, normal rate and tone.  She at times appears to have thought blocking.  Per patient's sister this is normal for her.  States at times she is delayed in her responses.  She endorses depression and anxiety with a congruent affect.  Patient denies any concerns with appetite or sleep at this time.  She denies auditory and visual hallucinations.  Denies paranoia and delusional thought.  Denies self-harm and homicidal ideations.  Denies suicidal ideations.  When discussing patient's presentation upon admission and endorsing passive suicidal ideations, patient adamantly denies.  States, "I told the nurse practitioner yesterday that I was not suicidal and I would not hurt myself". Patient contracts for safety with this Probation officer and states "I would not hurt myself".  Patient denies saying during her admission assessment, "I wanna die".  States she does have a firearm at home.  Gave permission for this writer to contact her sister.  Sister will obtain firearm and ensure patient does not have access to it.  Discussed treatment options with patient.  Discussed if patient did not feel safe returning home she could be recommended for inpatient  psychiatric admission, patient denies. States, "I told you I am not suicidal, I am not going to hurt myself". Discussed PHP/IOP program with patient. States she will not participate in any treatment with [facility]. States she had a bad experience when she was admitted at [facility] behavioral [facility] hospital, she would not elaborate on what this experience was.  Patient became upset when she realized that [facility] is a [employer] facility.  States, "I would not have come here if I knew it was [place]".  This Probation officer spoke to patient multiple times throughout the day.  Patient was concerned that her sister and therapist would be disappointed if she declined the inpatient psychiatric admission.  Patient gave this writer permission to contact her therapist Maura Crandall [phone].  Therapist was contacted and spoke with this Probation officer and the patient.Therapist states she is not concerned that patient will hurt herself. She is concerned that patient isn't functioning and her depression has worsened. Patient's sister was also contacted with patient's permission.  Patient ask if this writer could contact her provider Dr. Hartley Barefoot to discuss the treatment options that were provided to her.  Explained to patient that she could contact her provider and discuss these options.  Collateral: Colleen Peck patient's sister.  Sister states that she is a patient advocate for her sister.  Discussed in depth her efforts to help patient with "self-help".  Sister stated repeatedly, "I want her to get the help that she needs".  With patient's permission discussed treatment options for patient.  Sister has no immediate safety concerns  with patient returning home she is concerned because patient is no longer functioning.  She is not going to work, she has no motivation, laying in bed often and self isolating.  States due to not working patient now has financial troubles and no transportation.  Sister agreed to remove firearms from the  home where patient has no access to them.  She agrees to check in with patient and to provide transportation to patient for PHP/IOP if patient agrees.  After much deliberation and discussing PHP/IOP in depth patient agreed to call [facility] tomorrow for an intake interview.  At this time Colleen Peck is closed for the evening.  Patient was adamant she did not want to be referred to [facility] behavioral health outpatient PHP/IOP.  Patient also requested to read her medical chart before leaving this facility.  Explained to patient she could sign into my chart, but she could not read charts at this facility.  States, "I want to know exactly what you all are saying about me".  Colleen Summary: Timesha Peck was admitted to [facility] Continuous Assessment unit for passive SI, and crisis management.  Her improvement was monitored by continuous assessment/observation and her report of symptom reduction.  Her emotional mental status was also monitored by staff.  Inpatient psychiatric admission was discussed, patient declined.  Patient agrees to contact [facility] in the a.m. to conduct an intake interview for IOP/PHP.  She currently has individual therapy and a psychiatric provider in place.  She will follow-up with resources provided.     Upon completion of this admission the Tache Bobst was both mentally and medically stable for discharge denying suicidal/homicidal ideation, auditory/visual/tactile hallucinations, delusional thoughts and paranoia.     Total Time spent with patient: 45 minutes  Past Psychiatric History: See H&P Past Medical History:  Past Medical History:  Diagnosis Date   Abnormal pap    pt reports abnl pap many years ago.  Nl since then.   Allergy    seasonal   Anemia    Asthma    Bipolar disorder ([place])    Depression    Palpitations 03/12/2008   PTSD (post-traumatic stress disorder)    Seasonal allergies    Thyroid disease 2009   Graves disease (pt reported resolved);  hypothyriodism    Past Surgical History:  Procedure Laterality Date   DILATION AND CURETTAGE OF UTERUS  March 2006   LYMPH NODE BIOPSY Right 11/23/2019   Procedure: EXCISIONAL BIOPSY OF RIGHT NECK NODE;  Surgeon: Rozetta Nunnery, MD;  Location: [place];  Service: ENT;  Laterality: Right;   Family History:  Family History  Problem Relation Age of Onset   Drug abuse Father    Depression Maternal Grandmother    Anxiety disorder Maternal Grandmother    COPD Maternal Grandmother    Suicidality Cousin    Depression Cousin    Bipolar disorder Cousin    Hypertension Mother    Depression Mother    Diabetes Paternal Grandfather    COPD Paternal Grandmother    Depression Maternal Aunt    Breast cancer Maternal Aunt    Depression Maternal Aunt    Heart disease Neg Hx    Family Psychiatric History: See H&P Social History:  Social History   Substance and Sexual Activity  Alcohol Use Yes   Comment: occ     Social History   Substance and Sexual Activity  Drug Use Not Currently   Types: Marijuana   Comment: past use of marijuana  in '08-'09. occasional eats brownies w/ marijuana      Social History   Socioeconomic History   Marital status: Single    Spouse name: Not on file   Number of children: Not on file   Years of education: Not on file   Highest education level: Not on file  Occupational History   Not on file  Tobacco Use   Smoking status: Never   Smokeless tobacco: Never  Vaping Use   Vaping Use: Never used  Substance and Sexual Activity   Alcohol use: Yes    Comment: occ   Drug use: Not Currently    Types: Marijuana    Comment: past use of marijuana in '08-'09. 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   Social  Determinants of Health   Financial Resource Strain: Not on file  Food Insecurity: Not on file  Transportation Needs: Not on file  Physical Activity: Not on file  Stress: Not on file  Social Connections: Not on file   SDOH:  SDOH Screenings   Alcohol Screen: Not on file  Depression (PHQ2-9): Medium Risk   PHQ-2 Score: 14  Financial Resource Strain: Not on file  Food Insecurity: Not on file  Housing: Not on file  Physical Activity: Not on file  Social Connections: Not on file  Stress: Not on file  Tobacco Use: Low Risk    Smoking Tobacco Use: Never   Smokeless Tobacco Use: Never   Passive Exposure: Not on file  Transportation Needs: Not on file    Tobacco Cessation:  N/A, patient does not currently use tobacco products  Current Medications:  Current Facility-Administered Medications  Medication Dose Route Frequency Provider Last Rate Last Admin   acetaminophen (TYLENOL) tablet 650 mg  650 mg Oral Q6H PRN White, Patrice L, NP   650 mg at 05/10/21 1859   alum & mag hydroxide-simeth (MAALOX/MYLANTA) 200-200-20 MG/5ML suspension 30 mL  30 mL Oral Q4H PRN White, Patrice L, NP       hydrOXYzine (ATARAX/VISTARIL) tablet 25 mg  25 mg Oral TID PRN White, Patrice L, NP   25 mg at 05/11/21 0936   magnesium hydroxide (MILK OF MAGNESIA) suspension 30 mL  30 mL Oral Daily PRN White, Patrice L, NP       traZODone (DESYREL) tablet 50 mg  50 mg Oral QHS PRN White, Patrice L, NP   50 mg at 05/10/21 2035   Current Outpatient Medications  Medication Sig Dispense Refill   lamoTRIgine (LAMICTAL) 200 MG tablet Take 200 mg by mouth daily.     ondansetron (ZOFRAN-ODT) 4 MG disintegrating tablet Take 1 tablet by mouth every 6 (six) hours as needed.     albuterol (VENTOLIN HFA) 108 (90 Base) MCG/ACT inhaler INHALE 2 PUFFS INTO THE LUNGS EVERY 4 HOURS AS NEEDED FOR WHEEZING OR SHORTNESS OF BREATH 18 g 1   levothyroxine (SYNTHROID) 150 MCG tablet Take 150 mcg by mouth daily.      PTA Medications:  (Not in a hospital admission)   Musculoskeletal  Strength & Muscle Tone: within normal limits Gait & Station: normal Patient leans: N/A  Psychiatric Specialty Exam  Presentation  General Appearance: Casual; Fairly Groomed  Eye Contact:Good  Speech:Clear and Coherent; Normal Rate  Speech Volume:Normal  Handedness:Right   Mood and Affect  Mood:Anxious; Depressed  Affect:Tearful; Congruent   Thought Process  Thought Processes:Coherent  Descriptions of Associations:Intact  Orientation:Full (Time, Place and Person)  Thought Content:Logical  Diagnosis of Schizophrenia or Schizoaffective disorder in past: Yes  Duration of Psychotic Symptoms: Less than six months   Hallucinations:Hallucinations: None  Ideas of Reference:None  Suicidal Thoughts:Suicidal Thoughts: No SI Passive Intent and/or Plan: Without Intent; Without Plan  Homicidal Thoughts:Homicidal Thoughts: No   Sensorium  Memory:Immediate Fair; Recent Fair; Remote Fair  Judgment:Fair  Insight:Fair   Executive Functions  Concentration:Fair  Attention Span:Fair  [place]  Language:Good   Psychomotor Activity  Psychomotor Activity:Psychomotor Activity: Normal   Assets  Assets:Communication Skills; Desire for Improvement; Financial Resources/Insurance; Housing; Physical Health; Resilience; Social Support; Vocational/Educational   Sleep  Sleep:Sleep: Fair   No data recorded  Physical Exam  Physical Exam Vitals and nursing note reviewed.  Constitutional:      General: She is not in acute distress.    Appearance: Normal appearance. She is not ill-appearing.  HENT:     Head: Normocephalic.  Eyes:     General:        Right eye: No discharge.        Left eye: No discharge.     Conjunctiva/sclera: Conjunctivae normal.  Cardiovascular:     Rate and Rhythm: Normal rate.  Pulmonary:     Effort: Pulmonary effort is normal.  Musculoskeletal:        General:  Normal range of motion.     Cervical back: Normal range of motion.  Skin:    General: Skin is warm and dry.  Neurological:     Mental Status: She is alert and oriented to person, place, and time.  Psychiatric:        Attention and Perception: Attention normal.        Mood and Affect: Mood is anxious and depressed. Affect is tearful.        Speech: Speech normal.        Behavior: Behavior is withdrawn.        Thought Content: Thought content normal.        Judgment: Judgment normal.   Review of Systems  Constitutional: Negative.   HENT: Negative.    Eyes: Negative.   Respiratory: Negative.    Cardiovascular: Negative.   Musculoskeletal: Negative.   Skin: Negative.   Neurological: Negative.   Psychiatric/Behavioral:  Positive for depression. The patient is nervous/anxious.   Blood pressure 118/80, pulse 86, temperature 98.2 F (36.8 C), temperature source Oral, resp. rate 16, last menstrual period 04/13/2021, SpO2 100 %. There is no height or weight on file to calculate BMI.  Demographic Factors:  Low socioeconomic status, Living alone, and Access to firearms Patient Loss Factors: Financial problems/change in socioeconomic status  Historical Factors: NA  Risk Reduction Factors:   Sense of responsibility to family, Employed, Positive social support, Positive therapeutic relationship, and Positive coping skills or problem solving skills  Continued Clinical Symptoms:  Severe Anxiety and/or Agitation Bipolar Disorder:   Depressive phase Depression:   Hopelessness More than one psychiatric diagnosis  Cognitive Features That Contribute To Risk:  None    Suicide Risk:  Minimal: No identifiable suicidal ideation.  Patients presenting with no risk factors but with morbid ruminations; may be classified as minimal risk based on the severity of the depressive symptoms  Plan Of Care/Follow-up recommendations:  Activity:  as tolerated  Diet:  reguar  Disposition:  Discharge  patient.  Patient does not meet criteria for involuntary commitment.  Patient was provided psychiatric resources for [facility] for IOP/PHP within the instructions to contact them in the a.m. and conduct an intake interview.  Patient was instructed to follow-up with her provider Dr. Jacolyn Reedy and her individual therapist Maura Crandall.  Revonda Humphrey, NP 05/11/2021, 7:00 PM

## 2021-05-13 DIAGNOSIS — L659 Nonscarring hair loss, unspecified: Secondary | ICD-10-CM | POA: Diagnosis not present

## 2021-05-13 DIAGNOSIS — L089 Local infection of the skin and subcutaneous tissue, unspecified: Secondary | ICD-10-CM | POA: Diagnosis not present

## 2021-05-13 DIAGNOSIS — F312 Bipolar disorder, current episode manic severe with psychotic features: Secondary | ICD-10-CM | POA: Diagnosis not present

## 2021-05-13 DIAGNOSIS — L910 Hypertrophic scar: Secondary | ICD-10-CM | POA: Diagnosis not present

## 2021-05-13 DIAGNOSIS — L608 Other nail disorders: Secondary | ICD-10-CM | POA: Diagnosis not present

## 2021-05-13 DIAGNOSIS — F319 Bipolar disorder, unspecified: Secondary | ICD-10-CM | POA: Diagnosis not present

## 2021-05-13 DIAGNOSIS — L658 Other specified nonscarring hair loss: Secondary | ICD-10-CM | POA: Diagnosis not present

## 2021-05-19 DIAGNOSIS — F312 Bipolar disorder, current episode manic severe with psychotic features: Secondary | ICD-10-CM | POA: Diagnosis not present

## 2021-05-25 DIAGNOSIS — F312 Bipolar disorder, current episode manic severe with psychotic features: Secondary | ICD-10-CM | POA: Diagnosis not present

## 2021-05-25 DIAGNOSIS — F321 Major depressive disorder, single episode, moderate: Secondary | ICD-10-CM | POA: Diagnosis not present

## 2021-05-25 DIAGNOSIS — F329 Major depressive disorder, single episode, unspecified: Secondary | ICD-10-CM | POA: Diagnosis not present

## 2021-05-26 DIAGNOSIS — F321 Major depressive disorder, single episode, moderate: Secondary | ICD-10-CM | POA: Diagnosis not present

## 2021-05-26 DIAGNOSIS — F329 Major depressive disorder, single episode, unspecified: Secondary | ICD-10-CM | POA: Diagnosis not present

## 2021-05-27 DIAGNOSIS — F329 Major depressive disorder, single episode, unspecified: Secondary | ICD-10-CM | POA: Diagnosis not present

## 2021-05-27 DIAGNOSIS — F321 Major depressive disorder, single episode, moderate: Secondary | ICD-10-CM | POA: Diagnosis not present

## 2021-05-28 DIAGNOSIS — F321 Major depressive disorder, single episode, moderate: Secondary | ICD-10-CM | POA: Diagnosis not present

## 2021-05-28 DIAGNOSIS — F329 Major depressive disorder, single episode, unspecified: Secondary | ICD-10-CM | POA: Diagnosis not present

## 2021-05-29 DIAGNOSIS — F321 Major depressive disorder, single episode, moderate: Secondary | ICD-10-CM | POA: Diagnosis not present

## 2021-05-29 DIAGNOSIS — F329 Major depressive disorder, single episode, unspecified: Secondary | ICD-10-CM | POA: Diagnosis not present

## 2021-05-30 DIAGNOSIS — E039 Hypothyroidism, unspecified: Secondary | ICD-10-CM | POA: Diagnosis not present

## 2021-05-30 DIAGNOSIS — Z62810 Personal history of physical and sexual abuse in childhood: Secondary | ICD-10-CM | POA: Diagnosis not present

## 2021-05-30 DIAGNOSIS — J45909 Unspecified asthma, uncomplicated: Secondary | ICD-10-CM | POA: Diagnosis not present

## 2021-05-30 DIAGNOSIS — F411 Generalized anxiety disorder: Secondary | ICD-10-CM | POA: Diagnosis not present

## 2021-05-30 DIAGNOSIS — Z62811 Personal history of psychological abuse in childhood: Secondary | ICD-10-CM | POA: Diagnosis not present

## 2021-05-30 DIAGNOSIS — R45851 Suicidal ideations: Secondary | ICD-10-CM | POA: Diagnosis not present

## 2021-05-30 DIAGNOSIS — Z20822 Contact with and (suspected) exposure to covid-19: Secondary | ICD-10-CM | POA: Diagnosis not present

## 2021-05-30 DIAGNOSIS — F339 Major depressive disorder, recurrent, unspecified: Secondary | ICD-10-CM | POA: Diagnosis not present

## 2021-05-31 DIAGNOSIS — F332 Major depressive disorder, recurrent severe without psychotic features: Secondary | ICD-10-CM | POA: Diagnosis not present

## 2021-06-01 DIAGNOSIS — F332 Major depressive disorder, recurrent severe without psychotic features: Secondary | ICD-10-CM | POA: Diagnosis not present

## 2021-06-02 DIAGNOSIS — F332 Major depressive disorder, recurrent severe without psychotic features: Secondary | ICD-10-CM | POA: Diagnosis not present

## 2021-06-03 DIAGNOSIS — F332 Major depressive disorder, recurrent severe without psychotic features: Secondary | ICD-10-CM | POA: Diagnosis not present

## 2021-06-04 DIAGNOSIS — F332 Major depressive disorder, recurrent severe without psychotic features: Secondary | ICD-10-CM | POA: Diagnosis not present

## 2021-06-05 DIAGNOSIS — F332 Major depressive disorder, recurrent severe without psychotic features: Secondary | ICD-10-CM | POA: Diagnosis not present

## 2021-06-06 DIAGNOSIS — F332 Major depressive disorder, recurrent severe without psychotic features: Secondary | ICD-10-CM | POA: Diagnosis not present

## 2021-06-07 DIAGNOSIS — F332 Major depressive disorder, recurrent severe without psychotic features: Secondary | ICD-10-CM | POA: Diagnosis not present

## 2021-06-08 DIAGNOSIS — F332 Major depressive disorder, recurrent severe without psychotic features: Secondary | ICD-10-CM | POA: Diagnosis not present

## 2021-06-09 DIAGNOSIS — F332 Major depressive disorder, recurrent severe without psychotic features: Secondary | ICD-10-CM | POA: Diagnosis not present

## 2021-06-10 DIAGNOSIS — F319 Bipolar disorder, unspecified: Secondary | ICD-10-CM | POA: Diagnosis not present

## 2021-06-11 DIAGNOSIS — F312 Bipolar disorder, current episode manic severe with psychotic features: Secondary | ICD-10-CM | POA: Diagnosis not present

## 2021-06-19 DIAGNOSIS — J4521 Mild intermittent asthma with (acute) exacerbation: Secondary | ICD-10-CM | POA: Diagnosis not present

## 2021-06-19 DIAGNOSIS — F312 Bipolar disorder, current episode manic severe with psychotic features: Secondary | ICD-10-CM | POA: Diagnosis not present

## 2021-06-24 DIAGNOSIS — F319 Bipolar disorder, unspecified: Secondary | ICD-10-CM | POA: Diagnosis not present

## 2021-06-26 DIAGNOSIS — F312 Bipolar disorder, current episode manic severe with psychotic features: Secondary | ICD-10-CM | POA: Diagnosis not present

## 2021-07-03 DIAGNOSIS — F312 Bipolar disorder, current episode manic severe with psychotic features: Secondary | ICD-10-CM | POA: Diagnosis not present

## 2021-07-29 IMAGING — DX DG CHEST 2V
2 series · 2 of 2 positions shown · non-contrast
Comparison: 10/28/2017

CLINICAL DATA: Short of breath

EXAM:
CHEST - 2 VIEW

[chest pa]
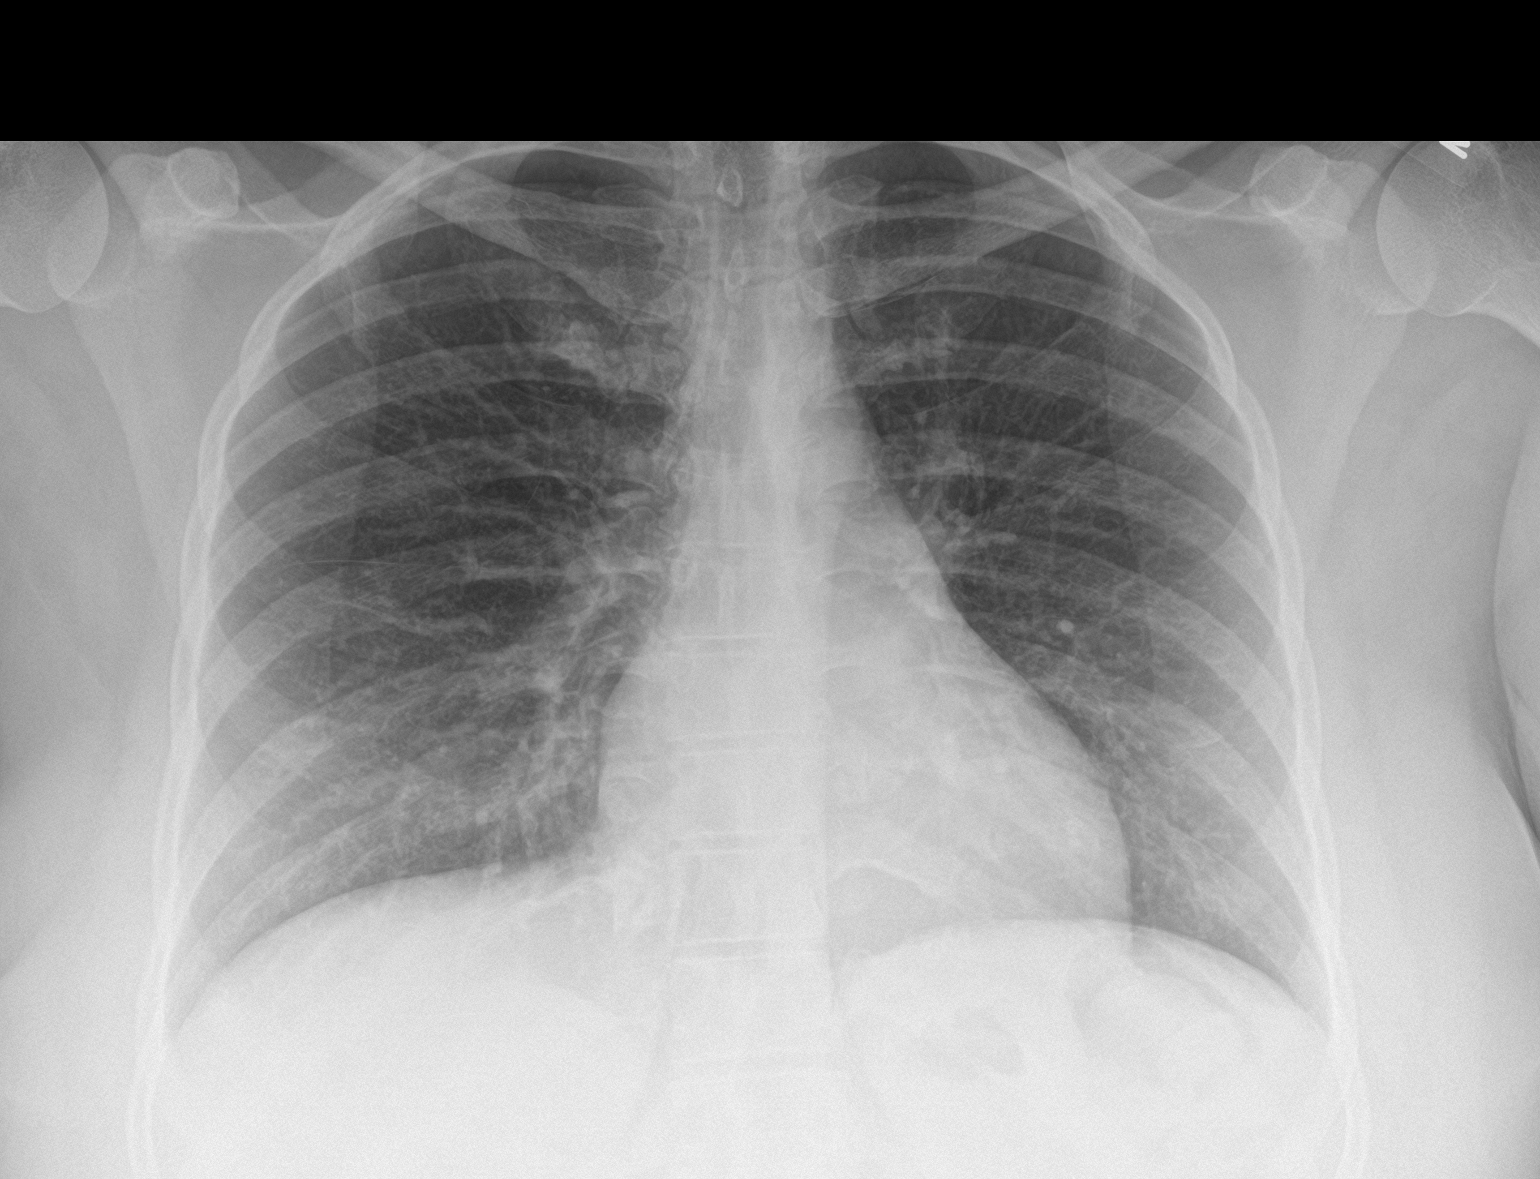

[chest lat]
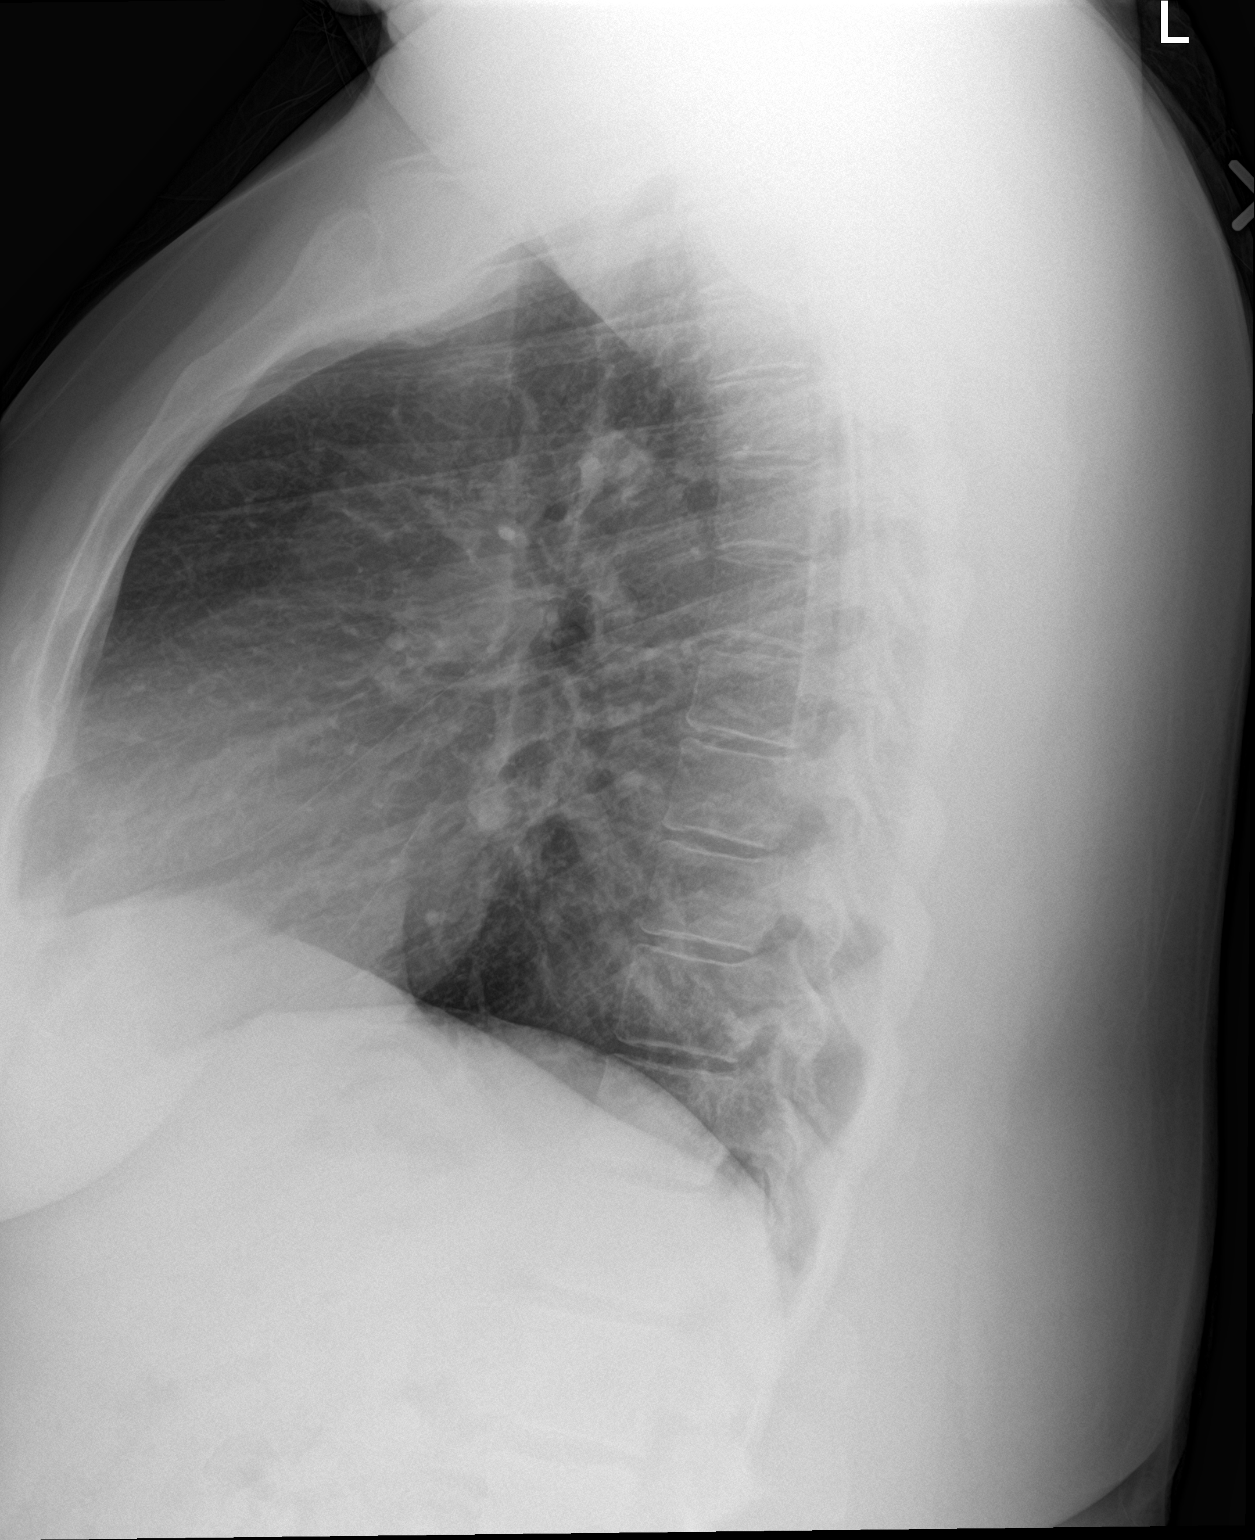

[2 of 2 positions shown; findings below may reference images not displayed]

FINDINGS: The heart size and mediastinal contours are within normal limits.
Both lungs are clear. The visualized skeletal structures are
unremarkable.
IMPRESSION: No active cardiopulmonary disease.

## 2022-01-06 ENCOUNTER — Encounter (HOSPITAL_COMMUNITY): Payer: Self-pay | Admitting: Emergency Medicine

## 2022-01-06 ENCOUNTER — Ambulatory Visit (HOSPITAL_COMMUNITY)
Admission: EM | Admit: 2022-01-06 | Discharge: 2022-01-06 | Disposition: A | Discharge status: Home / Self Care | Payer: Federal, State, Local not specified - PPO | Attending: Internal Medicine | Admitting: Internal Medicine

## 2022-01-06 DIAGNOSIS — R079 Chest pain, unspecified: Secondary | ICD-10-CM | POA: Diagnosis not present

## 2022-01-06 DIAGNOSIS — R42 Dizziness and giddiness: Secondary | ICD-10-CM | POA: Diagnosis not present

## 2022-01-06 DIAGNOSIS — F419 Anxiety disorder, unspecified: Secondary | ICD-10-CM

## 2022-01-06 NOTE — Discharge Instructions (Signed)
Your physical exam is normal today.  This is very reassuring.  Your EKG does not show any changes indicating a problem with your heart.  Your recent blood work from March is normal.   Discussed physical exam and available lab work findings in clinic with patient.  Counseled patient regarding appropriate use of medications and potential side effects for all medications recommended or prescribed today. Discussed red flag signs and symptoms of worsening condition,when to call the PCP office, return to urgent care, and when to seek higher level of care in the emergency department. Patient verbalizes understanding and agreement with plan. All questions answered. Patient discharged in stable condition.

## 2022-01-06 NOTE — ED Triage Notes (Signed)
Patient states that she began having chest pain yesterday after leaving work and while at work this am.  Patient experienced dizziness and left hand burning.  Patient does have a history of anxiety.  Patient has not eaten or drank anything today.

## 2022-01-06 NOTE — ED Provider Notes (Addendum)
[facility]    CSN: [id number] Arrival date & time: 01/06/22  2703      History   Chief Complaint Chief Complaint  Patient presents with   Chest Pain    HPI Colleen Peck is a 41 y.o. female.   Patient presents to urgent care for evaluation of episodic dizziness that she has had for the last 2 to 3 years.  Patient reports that she arrived at work this morning, her left hand became numb, she began to have pain to the left side of her chest, and she became dizzy.  Dizziness and left-sided chest pain have subsided.  She denies diaphoresis at time of the event, but does report some heart palpitations that have also since subsided.  Patient states that she has been having similar episodes to 1 experience this morning for the last 2 to 3 years.  She is unable to quantify chest pain she experienced, but states that it felt like a squeezing pain that increased in rate.  Unable to quantify amount of time it takes for dizziness and chest pain to subside. Reports improvement of symptoms with sitting down. She cannot identify any triggering factors for dizziness and chest pain symptoms. No urinary frequency, urgency, or dysuria. No fever/chills.  Reports adequate water intake and states that she eats relatively healthy and consistent meals.  She is currently on her menstrual cycle, but reports normal amount of bleeding and denies heavy bleeding.  She has never experienced burning to her left hand in the past and states that she is not a diabetic.  She called her psychiatrist this morning to inform them of her symptoms and was instructed to come to urgent care to be seen, then go to her psychiatrist appointment for medication refill.  She has a significant medical history of anxiety and will repeatedly begin a sentence, then state "it is okay".  She also has a significant medical history of asthma, but this is well controlled with albuterol inhaler as needed.  Denies drug use, excessive caffeine  intake, nausea, vomiting, diarrhea, headache, eye pain, URI symptoms, shortness of breath, and dizziness at time of exam.  Patient states that she has not been evaluated by a neurologist due to these ongoing episodes of dizziness and chest pain, but states "I have a neurologist".  She has not attempted use of over-the-counter medications for her symptoms.  No other aggravating or relieving factors for symptoms identified at this time.   Chest Pain   Past Medical History:  Diagnosis Date   Abnormal pap    pt reports abnl pap many years ago.  Nl since then.   Allergy    seasonal   Anemia    Asthma    Bipolar disorder ([place])    Depression    Palpitations 03/12/2008   PTSD (post-traumatic stress disorder)    Seasonal allergies    Thyroid disease 2009   Graves disease (pt reported resolved); hypothyriodism    Patient Active Problem List   Diagnosis Date Noted   Acute bilateral knee pain 02/04/2020   Chest pain 10/15/2019   Lump on neck 09/30/2019   Sciatic nerve pain, left 07/16/2019   Close exposure to COVID-19 virus 07/16/2019   Acute back pain with radiculopathy 01/22/2019   Onycholysis 11/14/2018   Palpable lymph node 50/03/3817   Cyclical mastalgia 29/93/7169   PMS (premenstrual syndrome) 08/20/2018   Vertigo 05/31/2018   Dermatofibroma of right lower leg 01/06/2017   History of borderline personality disorder 01/01/2016  Carotidynia 12/16/2015   Schizoaffective disorder, bipolar type ([place]) 11/26/2015   Anxiety    PTSD (post-traumatic stress disorder) 09/30/2015   Routine screening for STI (sexually transmitted infection)    Paranoia ([place])    Hypothyroidism 02/26/2015   Anemia 01/30/2015   Vitamin D deficiency 05/22/2013   Skin rash 03/05/2013   SOB (shortness of breath) on exertion 07/13/2012   Obesity 09/08/2006   Allergic rhinitis 09/08/2006   Asthma 09/08/2006   ECZEMA, ATOPIC DERMATITIS 09/08/2006    Past Surgical History:  Procedure Laterality Date    DILATION AND CURETTAGE OF UTERUS  March 2006   LYMPH NODE BIOPSY Right 11/23/2019   Procedure: EXCISIONAL BIOPSY OF RIGHT NECK NODE;  Surgeon: Rozetta Nunnery, MD;  Location: [facility];  Service: ENT;  Laterality: Right;    OB History   No obstetric history on file.      Home Medications    Prior to Admission medications   Medication Sig Start Date End Date Taking? Authorizing Provider  albuterol (VENTOLIN HFA) 108 (90 Base) MCG/ACT inhaler INHALE 2 PUFFS INTO THE LUNGS EVERY 4 HOURS AS NEEDED FOR WHEEZING OR SHORTNESS OF BREATH 12/03/20  Yes Benay Pike, MD  lamoTRIgine (LAMICTAL) 200 MG tablet Take 200 mg by mouth daily. 05/01/20   [provider]  levothyroxine (SYNTHROID) 150 MCG tablet Take 150 mcg by mouth daily. 11/17/20   [provider]  ondansetron (ZOFRAN-ODT) 4 MG disintegrating tablet Take 1 tablet by mouth every 6 (six) hours as needed. 12/05/20   [provider]    Family History Family History  Problem Relation Age of Onset   Drug abuse Father    Depression Maternal Grandmother    Anxiety disorder Maternal Grandmother    COPD Maternal Grandmother    Suicidality Cousin    Depression Cousin    Bipolar disorder Cousin    Hypertension Mother    Depression Mother    Diabetes Paternal Grandfather    COPD Paternal Grandmother    Depression Maternal Aunt    Breast cancer Maternal Aunt    Depression Maternal Aunt    Heart disease Neg Hx     Social History Social History   Tobacco Use   Smoking status: Never   Smokeless tobacco: Never  Vaping Use   Vaping Use: Never used  Substance Use Topics   Alcohol use: Yes    Comment: occ   Drug use: Not Currently    Types: Marijuana    Comment: past use of marijuana in '08-'09. occasional eats brownies w/ marijuana       Allergies   Abilify [aripiprazole], Kiwi extract, and Meloxicam   Review of Systems Review of Systems  Cardiovascular:  Positive for chest  pain.  Per HPI   Physical Exam Triage Vital Signs ED Triage Vitals  Enc Vitals Group     BP 01/06/22 0918 110/77     Pulse Rate 01/06/22 0918 78     Resp 01/06/22 0918 18     Temp 01/06/22 0918 98.2 F (36.8 C)     Temp Source 01/06/22 0918 Oral     SpO2 01/06/22 0918 95 %     Weight --      Height 01/06/22 0920 5' 6.5" (1.689 m)     Head Circumference --      Peak Flow --      Pain Score 01/06/22 0920 0     Pain Loc --      Pain Edu? --  Excl. in GC? --    No data found.  Updated Vital Signs BP 110/77 (BP Location: Left Arm)   Pulse 78   Temp 98.2 F (36.8 C) (Oral)   Resp 18   Ht 5' 6.5" (1.689 m)   LMP 01/04/2022   SpO2 95%   BMI 40.70 kg/m   Visual Acuity Right Eye Distance:   Left Eye Distance:   Bilateral Distance:    Right Eye Near:   Left Eye Near:    Bilateral Near:     Physical Exam Vitals and nursing note reviewed.  Constitutional:      Appearance: Normal appearance. She is not ill-appearing or toxic-appearing.     Comments: Very pleasant patient sitting on exam in position of comfort table in no acute distress.   HENT:     Head: Normocephalic and atraumatic.     Right Ear: Hearing, tympanic membrane, ear canal and external ear normal.     Left Ear: Hearing, tympanic membrane, ear canal and external ear normal.     Nose: Nose normal. No rhinorrhea.     Mouth/Throat:     Lips: Pink.     Mouth: Mucous membranes are moist.     Pharynx: No posterior oropharyngeal erythema.  Eyes:     General: Lids are normal. Vision grossly intact. Gaze aligned appropriately.     Extraocular Movements: Extraocular movements intact.     Conjunctiva/sclera: Conjunctivae normal.  Cardiovascular:     Rate and Rhythm: Normal rate and regular rhythm.     Heart sounds: Normal heart sounds, S1 normal and S2 normal.  Pulmonary:     Effort: Pulmonary effort is normal. No respiratory distress.     Breath sounds: Normal breath sounds and air entry.  Abdominal:      General: Bowel sounds are normal.     Palpations: Abdomen is soft.     Tenderness: There is no abdominal tenderness. There is no right CVA tenderness, left CVA tenderness or guarding.  Musculoskeletal:     Cervical back: Normal range of motion and neck supple.  Lymphadenopathy:     Cervical: No cervical adenopathy.  Skin:    General: Skin is warm and dry.     Capillary Refill: Capillary refill takes less than 2 seconds.     Findings: No rash.  Neurological:     General: No focal deficit present.     Mental Status: She is alert and oriented to person, place, and time. Mental status is at baseline.     Cranial Nerves: Cranial nerves 2-12 are intact. No dysarthria or facial asymmetry.     Sensory: Sensation is intact.     Motor: Motor function is intact.     Coordination: Coordination is intact.     Gait: Gait is intact.     Comments: 5/5 grip strength to bilateral upper extremities.  5/5 strength against resistance to bilateral upper and lower extremities with abduction and abduction.  No focal neurologic deficit identified at this time.  Psychiatric:        Attention and Perception: Attention normal.        Mood and Affect: Mood is anxious. Affect is flat.        Speech: Speech normal.        Behavior: Behavior normal. Behavior is cooperative.        Thought Content: Thought content normal.        Judgment: Judgment normal.      UC Treatments / Results  Labs (  all labs ordered are listed, but only abnormal results are displayed) Labs Reviewed - No data to display  EKG   Radiology No results found.  Procedures Procedures (including critical care time)  Medications Ordered in UC Medications - No data to display  Initial Impression / Assessment and Plan / UC Course  I have reviewed the triage vital signs and the nursing notes.  Pertinent labs & imaging results that were available during my care of the patient were reviewed by me and considered in my medical decision  making (see chart for details).  1.  Dizziness and nonspecific chest pain  Unknown etiology of dizziness and nonspecific chest pain.  EKG shows normal sinus rhythm with a ventricular rate of 68 bpm.  There is no acute coronary abnormality to the EKG.  Patient denies urinary symptoms, low suspicion for urinary infectious cause of symptoms.  Patient does not appear to be dehydrated to physical exam and currently presents with stable vital signs.  She is inquiring if blood work needs to be done today.  Blood work from March 2023 appears normal.  Doubt acute electrolyte imbalance or drop to blood levels today as she is well-appearing and denies subjective history of nausea, vomiting, and diarrhea.  Patient has low cardiovascular risk factors, normal vital signs, and a normal EKG.  Low suspicion for acute cardiac cause of dizziness.  No clinical indication for referral to higher level of care for further work-up or imaging.  Strict ED return precautions given.  Patient is tolerating p.o. liquids and has been advised to increase her fluid intake to at least 64 ounces of water per day to maintain adequate hydration.  She is to follow-up with her psychiatrist regarding her medications after visit at urgent care today.   Discussed physical exam and available lab work findings in clinic with patient.  Counseled patient regarding appropriate use of medications and potential side effects for all medications recommended or prescribed today. Discussed red flag signs and symptoms of worsening condition,when to call the PCP office, return to urgent care, and when to seek higher level of care in the emergency department. Patient verbalizes understanding and agreement with plan. All questions answered. Patient discharged in stable condition.  Final Clinical Impressions(s) / UC Diagnoses   Final diagnoses:  Nonspecific chest pain  Anxiety  Dizziness     Discharge Instructions      Your physical exam is normal  today.  This is very reassuring.  Your EKG does not show any changes indicating a problem with your heart.  Your recent blood work from March is normal.   Discussed physical exam and available lab work findings in clinic with patient.  Counseled patient regarding appropriate use of medications and potential side effects for all medications recommended or prescribed today. Discussed red flag signs and symptoms of worsening condition,when to call the PCP office, return to urgent care, and when to seek higher level of care in the emergency department. Patient verbalizes understanding and agreement with plan. All questions answered. Patient discharged in stable condition.     ED Prescriptions   None    PDMP not reviewed this encounter.   Talbot Grumbling, [place] 01/06/22 [place], [place], [place] 01/06/22 1053

## 2022-04-09 ENCOUNTER — Encounter (HOSPITAL_COMMUNITY): Payer: Self-pay | Admitting: Emergency Medicine

## 2022-04-09 ENCOUNTER — Emergency Department (HOSPITAL_COMMUNITY): Payer: Federal, State, Local not specified - PPO

## 2022-04-09 ENCOUNTER — Other Ambulatory Visit: Payer: Self-pay

## 2022-04-09 ENCOUNTER — Emergency Department (HOSPITAL_COMMUNITY)
Admission: EM | Admit: 2022-04-09 | Discharge: 2022-04-10 | Disposition: A | Discharge status: Home / Self Care | Payer: Federal, State, Local not specified - PPO | Attending: Emergency Medicine | Admitting: Emergency Medicine

## 2022-04-09 DIAGNOSIS — R109 Unspecified abdominal pain: Secondary | ICD-10-CM | POA: Insufficient documentation

## 2022-04-09 DIAGNOSIS — M545 Low back pain, unspecified: Secondary | ICD-10-CM | POA: Insufficient documentation

## 2022-04-09 DIAGNOSIS — E876 Hypokalemia: Secondary | ICD-10-CM | POA: Diagnosis not present

## 2022-04-09 DIAGNOSIS — N9489 Other specified conditions associated with female genital organs and menstrual cycle: Secondary | ICD-10-CM | POA: Diagnosis not present

## 2022-04-09 DIAGNOSIS — R300 Dysuria: Secondary | ICD-10-CM | POA: Insufficient documentation

## 2022-04-09 LAB — COMPREHENSIVE METABOLIC PANEL
ALT: 39 U/L (ref 0–44)
AST: 59 U/L — ABNORMAL HIGH (ref 15–41)
Albumin: 4.8 g/dL (ref 3.5–5.0)
Alkaline Phosphatase: 43 U/L (ref 38–126)
Anion gap: 10 (ref 5–15)
BUN: 6 mg/dL (ref 6–20)
CO2: 27 mmol/L (ref 22–32)
Calcium: 9.5 mg/dL (ref 8.9–10.3)
Chloride: 101 mmol/L (ref 98–111)
Creatinine, Ser: 0.9 mg/dL (ref 0.44–1.00)
GFR, Estimated: >60 mL/min (ref >60)
Glucose, Bld: 79 mg/dL (ref 70–99)
Potassium: 2.6 mmol/L — CL (ref 3.5–5.1)
Sodium: 138 mmol/L (ref 135–145)
Total Bilirubin: 0.8 mg/dL (ref 0.3–1.2)
Total Protein: 7.7 g/dL (ref 6.5–8.1)

## 2022-04-09 LAB — URINALYSIS, ROUTINE W REFLEX MICROSCOPIC
Bilirubin Urine: NEGATIVE
Glucose, UA: NEGATIVE mg/dL
Hgb urine dipstick: NEGATIVE
Ketones, ur: 5 mg/dL — AB
Leukocytes,Ua: NEGATIVE
Nitrite: NEGATIVE
Protein, ur: 100 mg/dL — AB
Specific Gravity, Urine: 1.03 (ref 1.005–1.030)
pH: 5 (ref 5.0–8.0)

## 2022-04-09 LAB — CBC WITH DIFFERENTIAL/PLATELET
Abs Immature Granulocytes: 0.01 10*3/uL (ref 0.00–0.07)
Basophils Absolute: 0 10*3/uL (ref 0.0–0.1)
Basophils Relative: 1 %
Eosinophils Absolute: 0.1 10*3/uL (ref 0.0–0.5)
Eosinophils Relative: 2 %
HCT: 37.2 % (ref 36.0–46.0)
Hemoglobin: 12 g/dL (ref 12.0–15.0)
Immature Granulocytes: 0 %
Lymphocytes Relative: 57 %
Lymphs Abs: 2.7 10*3/uL (ref 0.7–4.0)
MCH: 28.6 pg (ref 26.0–34.0)
MCHC: 32.3 g/dL (ref 30.0–36.0)
MCV: 88.6 fL (ref 80.0–100.0)
Monocytes Absolute: 0.3 10*3/uL (ref 0.1–1.0)
Monocytes Relative: 6 %
Neutro Abs: 1.6 10*3/uL — ABNORMAL LOW (ref 1.7–7.7)
Neutrophils Relative %: 34 %
Platelets: 189 10*3/uL (ref 150–400)
RBC: 4.2 MIL/uL (ref 3.87–5.11)
RDW: 13.6 % (ref 11.5–15.5)
WBC: 4.7 10*3/uL (ref 4.0–10.5)
nRBC: 0 % (ref 0.0–0.2)

## 2022-04-09 LAB — LIPASE, BLOOD: Lipase: 30 U/L (ref 11–51)

## 2022-04-09 LAB — I-STAT BETA HCG BLOOD, ED (MC, WL, AP ONLY): I-stat hCG, quantitative: <5 m[IU]/mL (ref <5)

## 2022-04-09 LAB — MAGNESIUM: Magnesium: 2.6 mg/dL — ABNORMAL HIGH (ref 1.7–2.4)

## 2022-04-09 MED ORDER — POTASSIUM CHLORIDE CRYS ER 20 MEQ PO TBCR
40.0000 meq | EXTENDED_RELEASE_TABLET | Freq: Once | ORAL | Status: AC
  Administered 2022-04-09: 40 meq via ORAL
  Filled 2022-04-09: qty 2

## 2022-04-09 MED ORDER — SODIUM CHLORIDE 0.9 % IV BOLUS
1000.0000 mL | Freq: Once | INTRAVENOUS | Status: AC
  Administered 2022-04-09: 1000 mL via INTRAVENOUS

## 2022-04-09 MED ORDER — POTASSIUM CHLORIDE 10 MEQ/100ML IV SOLN
10.0000 meq | INTRAVENOUS | Status: AC
  Administered 2022-04-09: 10 meq via INTRAVENOUS
  Filled 2022-04-09: qty 100

## 2022-04-09 MED ORDER — POTASSIUM CHLORIDE CRYS ER 20 MEQ PO TBCR: 20.0000 meq | EXTENDED_RELEASE_TABLET | Freq: Two times a day (BID) | ORAL | 0 refills | Status: DC

## 2022-04-09 NOTE — ED Provider Triage Note (Signed)
Emergency Medicine Provider Triage Evaluation Note  Colleen Peck , a 41 y.o. female  was evaluated in triage.  Pt complains of lower back pain for a couple of days. She denies any recent injury. Pain hurts worse with movements. She has also noticed darker urine than normal, but she is on her menstrual cycle.   She reports that she was sent to the Emergency Department by one of her outpatient providers. She said they "just wanted her to be checked out"  Review of Systems  Positive:  Negative:   Physical Exam  BP 112/80   Pulse 70   Temp 98.7 F (37.1 C) (Oral)   Resp 18   SpO2 99%  Gen:   Awake, no distress   Resp:  Normal effort  MSK:   Moves extremities without difficulty  Other:    Medical Decision Making  Medically screening exam initiated at 3:57 PM.  Appropriate orders placed.  Lakeidra Reliford was informed that the remainder of the evaluation will be completed by another provider, this initial triage assessment does not replace that evaluation, and the importance of remaining in the ED until their evaluation is complete.     Adolphus Birchwood, [place] 04/09/22 1559

## 2022-04-09 NOTE — ED Triage Notes (Signed)
Pt with back pain, more so in the right flank.  Pt states she has had this all week.  Pt reports "dark urine" .

## 2022-04-09 NOTE — ED Provider Notes (Signed)
[facility] DEPT Provider Note   CSN: [id number] Arrival date & time: 04/09/22  1451     History {Add pertinent medical, surgical, social history, OB history to HPI:1} Chief Complaint  Patient presents with  . Back Pain    Colleen Peck is a 41 y.o. female.  Patient with history of psychiatric illness presenting with a 1 week history of low back pain that is constant.  Worse with movement.  Has not tried any medications for it at home.  Denies any fall or trauma.  No radiation of the pain down her legs.  No weakness, numbness or tingling.  No fevers, chills, nausea or vomiting.  Has noticed that her urine is darker than usual but she is also on her menstrual cycle.  Denies any pain with urination or frequency or urgency.  No fever.  No bowel or bladder incontinence.  No history of IV drug abuse or cancer.  The pain does not radiate down her legs and is not associate with any numbness or tingling.   Back Pain Associated symptoms: dysuria   Associated symptoms: no abdominal pain, no fever, no headaches and no weakness        Home Medications Prior to Admission medications   Medication Sig Start Date End Date Taking? Authorizing Provider  albuterol (VENTOLIN HFA) 108 (90 Base) MCG/ACT inhaler INHALE 2 PUFFS INTO THE LUNGS EVERY 4 HOURS AS NEEDED FOR WHEEZING OR SHORTNESS OF BREATH 12/03/20   Benay Pike, MD  lamoTRIgine (LAMICTAL) 200 MG tablet Take 200 mg by mouth daily. 05/01/20   [provider]  levothyroxine (SYNTHROID) 150 MCG tablet Take 150 mcg by mouth daily. 11/17/20   [provider]  ondansetron (ZOFRAN-ODT) 4 MG disintegrating tablet Take 1 tablet by mouth every 6 (six) hours as needed. 12/05/20   [provider]      Allergies    Abilify [aripiprazole], Kiwi extract, and Meloxicam    Review of Systems   Review of Systems  Constitutional:  Negative for fever.  Gastrointestinal:  Negative for abdominal pain,  nausea and vomiting.  Genitourinary:  Positive for dysuria, flank pain, frequency and urgency. Negative for vaginal bleeding and vaginal discharge.  Musculoskeletal:  Positive for back pain.  Skin:  Negative for rash.  Neurological:  Negative for weakness and headaches.   all other systems are negative except as noted in the HPI and PMH.    Physical Exam Updated Vital Signs BP 117/88 (BP Location: Right Arm)   Pulse 73   Temp 98.7 F (37.1 C) (Oral)   Resp 18   SpO2 95%  Physical Exam Vitals and nursing note reviewed.  Constitutional:      General: She is not in acute distress.    Appearance: She is well-developed.     Comments: Flat affect, poor eye contact  HENT:     Head: Normocephalic and atraumatic.     Mouth/Throat:     Pharynx: No oropharyngeal exudate.  Eyes:     Conjunctiva/sclera: Conjunctivae normal.     Pupils: Pupils are equal, round, and reactive to light.  Neck:     Comments: No meningismus. Cardiovascular:     Rate and Rhythm: Normal rate and regular rhythm.     Heart sounds: Normal heart sounds. No murmur heard. Pulmonary:     Effort: Pulmonary effort is normal. No respiratory distress.     Breath sounds: Normal breath sounds.  Abdominal:     Palpations: Abdomen is soft.  Tenderness: There is no abdominal tenderness. There is no guarding or rebound.  Musculoskeletal:        General: Tenderness present. Normal range of motion.     Cervical back: Normal range of motion and neck supple.     Comments: Paraspinal lumbar tenderness bilaterally.  No midline tenderness  5/5 strength in bilateral lower extremities. Ankle plantar and dorsiflexion intact. Great toe extension intact bilaterally. +2 DP and PT pulses. +2 patellar reflexes bilaterally. Normal gait.   Skin:    General: Skin is warm.  Neurological:     Mental Status: She is alert and oriented to person, place, and time.     Cranial Nerves: No cranial nerve deficit.     Motor: No abnormal muscle  tone.     Coordination: Coordination normal.     Comments:  5/5 strength throughout. CN 2-12 intact.Equal grip strength.   Psychiatric:        Behavior: Behavior normal.    ED Results / Procedures / Treatments   Labs (all labs ordered are listed, but only abnormal results are displayed) Labs Reviewed  COMPREHENSIVE METABOLIC PANEL - Abnormal; Notable for the following components:      Result Value   Potassium 2.6 (*)    AST 59 (*)    All other components within normal limits  CBC WITH DIFFERENTIAL/PLATELET - Abnormal; Notable for the following components:   Neutro Abs 1.6 (*)    All other components within normal limits  URINALYSIS, ROUTINE W REFLEX MICROSCOPIC - Abnormal; Notable for the following components:   Color, Urine AMBER (*)    APPearance HAZY (*)    Ketones, ur 5 (*)    Protein, ur 100 (*)    Bacteria, UA RARE (*)    All other components within normal limits  MAGNESIUM - Abnormal; Notable for the following components:   Magnesium 2.6 (*)    All other components within normal limits  URINE CULTURE  LIPASE, BLOOD  POTASSIUM  I-STAT BETA HCG BLOOD, ED ([facility], [facility], [facility] ONLY)    EKG None  Radiology CT Renal Stone Study  Result Date: 04/09/2022 CLINICAL DATA:  Right-sided flank pain and dark colored urine, initial encounter EXAM: CT ABDOMEN AND PELVIS WITHOUT CONTRAST TECHNIQUE: Multidetector CT imaging of the abdomen and pelvis was performed following the standard protocol without IV contrast. RADIATION DOSE REDUCTION: This exam was performed according to the departmental dose-optimization program which includes automated exposure control, adjustment of the mA and/or kV according to patient size and/or use of iterative reconstruction technique. COMPARISON:  None Available. FINDINGS: Lower chest: Small bilateral pleural effusions are seen. No focal infiltrate or parenchymal nodules are noted. Hepatobiliary: No focal liver abnormality is seen. No gallstones, gallbladder wall  thickening, or biliary dilatation. Pancreas: Unremarkable. No pancreatic ductal dilatation or surrounding inflammatory changes. Spleen: Normal in size without focal abnormality. Adrenals/Urinary Tract: Adrenal glands are within normal limits. Kidneys show no renal calculi or obstructive changes. The bladder is decompressed. Stomach/Bowel: No obstructive or inflammatory changes of colon are noted. The appendix is within normal limits. No inflammatory changes are seen. Small bowel and stomach are within normal limits. Vascular/Lymphatic: No significant vascular findings are present. No enlarged abdominal or pelvic lymph nodes. Reproductive: Uterus and bilateral adnexa are unremarkable. Other: No abdominal wall hernia or abnormality. No abdominopelvic ascites. Musculoskeletal: No acute or significant osseous findings. IMPRESSION: Small bilateral pleural effusions. No renal calculi or No other focal abnormality is seen. Electronically Signed   By: Linus Mako.D.  On: 04/09/2022 22:42    Procedures Procedures  {Document cardiac monitor, telemetry assessment procedure when appropriate:1}  Medications Ordered in ED Medications  potassium chloride 10 mEq in 100 mL IVPB (has no administration in time range)  potassium chloride SA (KLOR-CON M) CR tablet 40 mEq (has no administration in time range)  sodium chloride 0.9 % bolus 1,000 mL (has no administration in time range)    ED Course/ Medical Decision Making/ A&P Clinical Course as of 04/09/22 2125  Fri Apr 09, 2022  1807 K 2.6  [GL]    Clinical Course User Index [GL] Adolphus Birchwood, PA-C                           Medical Decision Making Amount and/or Complexity of Data Reviewed Labs: ordered. Decision-making details documented in ED Course. Radiology: ordered and independent interpretation performed. Decision-making details documented in ED Course.  Risk Prescription drug management.   1 week of low back pain with change in character  to her urine.  Vital stable, no distress.  Intact distal strength, sensation, pulses and reflexes.  Low suspicion for cord compression or cauda equina.  Labs markable for hypokalemia of 2.6. Creatinine is normal.  Urinalysis negative for infection or blood but does show proteinuria of unclear significance.  CT negative for kidney stone or other acute pathology. {Document critical care time when appropriate:1} {Document review of labs and clinical decision tools ie heart score, Chads2Vasc2 etc:1}  {Document your independent review of radiology images, and any outside records:1} {Document your discussion with family members, caretakers, and with consultants:1} {Document social determinants of health affecting pt's care:1} {Document your decision making why or why not admission, treatments were needed:1} Final Clinical Impression(s) / ED Diagnoses Final diagnoses:  None    Rx / DC Orders ED Discharge Orders     None

## 2022-04-09 NOTE — ED Notes (Signed)
Critical K+ 2.6, provider made aware. PA notified.

## 2022-04-09 NOTE — Discharge Instructions (Signed)
Your potassium is low.  Take the supplementation as prescribed.  Follow-up with the kidney doctor regarding the protein in your urine.  There is no evidence of kidney stones on your CT scan today. Return to the ED with worsening pain, weakness, numbness, tingling, fever, bowel or bladder incontinence or any other concerns.

## 2022-04-10 DIAGNOSIS — M545 Low back pain, unspecified: Secondary | ICD-10-CM | POA: Diagnosis not present

## 2022-04-10 LAB — POTASSIUM: Potassium: 2.8 mmol/L — ABNORMAL LOW (ref 3.5–5.1)

## 2022-04-10 MED ORDER — POTASSIUM CHLORIDE CRYS ER 20 MEQ PO TBCR
40.0000 meq | EXTENDED_RELEASE_TABLET | Freq: Once | ORAL | Status: AC
  Administered 2022-04-10: 40 meq via ORAL
  Filled 2022-04-10: qty 2

## 2022-04-10 NOTE — ED Notes (Addendum)
Pt is exhibiting signs of paranoia. Mother states pt has not taken her meds in a year for this. She has been in communication with her psychiatrist who advised her to come in to make sure she is physically healthy before they are going to work towards a treatment plan, possible IVC for her. RN discussed with mother about [place] and [place]. She states that will make it worse- she has been there before. She states she will be in touch with psychiatrist tomorrow. Pt tearful but leaving with mother at this time.

## 2022-04-11 LAB — URINE CULTURE

## 2022-05-03 ENCOUNTER — Encounter (HOSPITAL_BASED_OUTPATIENT_CLINIC_OR_DEPARTMENT_OTHER): Payer: Self-pay

## 2022-05-03 ENCOUNTER — Other Ambulatory Visit: Payer: Self-pay

## 2022-05-03 ENCOUNTER — Observation Stay (HOSPITAL_BASED_OUTPATIENT_CLINIC_OR_DEPARTMENT_OTHER)
Admission: EM | Admit: 2022-05-03 | Discharge: 2022-05-05 | Disposition: A | Discharge status: Home / Self Care | Payer: Federal, State, Local not specified - PPO | Attending: Family Medicine | Admitting: Family Medicine

## 2022-05-03 ENCOUNTER — Emergency Department (HOSPITAL_BASED_OUTPATIENT_CLINIC_OR_DEPARTMENT_OTHER): Payer: Federal, State, Local not specified - PPO

## 2022-05-03 DIAGNOSIS — E876 Hypokalemia: Secondary | ICD-10-CM | POA: Diagnosis not present

## 2022-05-03 DIAGNOSIS — F4310 Post-traumatic stress disorder, unspecified: Secondary | ICD-10-CM | POA: Diagnosis not present

## 2022-05-03 DIAGNOSIS — J4520 Mild intermittent asthma, uncomplicated: Secondary | ICD-10-CM | POA: Diagnosis not present

## 2022-05-03 DIAGNOSIS — F319 Bipolar disorder, unspecified: Secondary | ICD-10-CM | POA: Insufficient documentation

## 2022-05-03 DIAGNOSIS — R001 Bradycardia, unspecified: Secondary | ICD-10-CM | POA: Diagnosis present

## 2022-05-03 DIAGNOSIS — R4182 Altered mental status, unspecified: Secondary | ICD-10-CM | POA: Insufficient documentation

## 2022-05-03 DIAGNOSIS — D649 Anemia, unspecified: Secondary | ICD-10-CM | POA: Insufficient documentation

## 2022-05-03 DIAGNOSIS — Z1152 Encounter for screening for COVID-19: Secondary | ICD-10-CM | POA: Insufficient documentation

## 2022-05-03 DIAGNOSIS — E039 Hypothyroidism, unspecified: Secondary | ICD-10-CM

## 2022-05-03 DIAGNOSIS — Z91148 Patient's other noncompliance with medication regimen for other reason: Secondary | ICD-10-CM | POA: Insufficient documentation

## 2022-05-03 DIAGNOSIS — Z6835 Body mass index (BMI) 35.0-35.9, adult: Secondary | ICD-10-CM | POA: Diagnosis not present

## 2022-05-03 DIAGNOSIS — Z79890 Hormone replacement therapy (postmenopausal): Secondary | ICD-10-CM | POA: Diagnosis not present

## 2022-05-03 DIAGNOSIS — E035 Myxedema coma: Secondary | ICD-10-CM | POA: Insufficient documentation

## 2022-05-03 DIAGNOSIS — K219 Gastro-esophageal reflux disease without esophagitis: Secondary | ICD-10-CM | POA: Diagnosis not present

## 2022-05-03 LAB — COMPREHENSIVE METABOLIC PANEL
ALT: 74 U/L — ABNORMAL HIGH (ref 0–44)
AST: 74 U/L — ABNORMAL HIGH (ref 15–41)
Albumin: 4.7 g/dL (ref 3.5–5.0)
Alkaline Phosphatase: 45 U/L (ref 38–126)
Anion gap: 11 (ref 5–15)
BUN: 6 mg/dL (ref 6–20)
CO2: 30 mmol/L (ref 22–32)
Calcium: 9.9 mg/dL (ref 8.9–10.3)
Chloride: 99 mmol/L (ref 98–111)
Creatinine, Ser: 0.9 mg/dL (ref 0.44–1.00)
GFR, Estimated: >60 mL/min (ref >60)
Glucose, Bld: 91 mg/dL (ref 70–99)
Potassium: 2.8 mmol/L — ABNORMAL LOW (ref 3.5–5.1)
Sodium: 140 mmol/L (ref 135–145)
Total Bilirubin: 0.7 mg/dL (ref 0.3–1.2)
Total Protein: 7.2 g/dL (ref 6.5–8.1)

## 2022-05-03 LAB — TROPONIN I (HIGH SENSITIVITY): Troponin I (High Sensitivity): 3 ng/L (ref <18)

## 2022-05-03 LAB — CBC WITH DIFFERENTIAL/PLATELET
Abs Immature Granulocytes: 0.01 10*3/uL (ref 0.00–0.07)
Basophils Absolute: 0.1 10*3/uL (ref 0.0–0.1)
Basophils Relative: 1 %
Eosinophils Absolute: 0.1 10*3/uL (ref 0.0–0.5)
Eosinophils Relative: 2 %
HCT: 34.5 % — ABNORMAL LOW (ref 36.0–46.0)
Hemoglobin: 11.1 g/dL — ABNORMAL LOW (ref 12.0–15.0)
Immature Granulocytes: 0 %
Lymphocytes Relative: 52 %
Lymphs Abs: 2.5 10*3/uL (ref 0.7–4.0)
MCH: 28.3 pg (ref 26.0–34.0)
MCHC: 32.2 g/dL (ref 30.0–36.0)
MCV: 88 fL (ref 80.0–100.0)
Monocytes Absolute: 0.3 10*3/uL (ref 0.1–1.0)
Monocytes Relative: 6 %
Neutro Abs: 1.9 10*3/uL (ref 1.7–7.7)
Neutrophils Relative %: 39 %
Platelets: 200 10*3/uL (ref 150–400)
RBC: 3.92 MIL/uL (ref 3.87–5.11)
RDW: 13.3 % (ref 11.5–15.5)
WBC: 4.8 10*3/uL (ref 4.0–10.5)
nRBC: 0 % (ref 0.0–0.2)

## 2022-05-03 LAB — HCG, QUANTITATIVE, PREGNANCY: hCG, Beta Chain, Quant, S: <1 m[IU]/mL (ref <5)

## 2022-05-03 LAB — SARS CORONAVIRUS 2 BY RT PCR: SARS Coronavirus 2 by RT PCR: NEGATIVE

## 2022-05-03 LAB — MAGNESIUM: Magnesium: 2.2 mg/dL (ref 1.7–2.4)

## 2022-05-03 LAB — ETHANOL: Alcohol, Ethyl (B): <10 mg/dL (ref <10)

## 2022-05-03 MED ORDER — POTASSIUM CHLORIDE 10 MEQ/100ML IV SOLN
10.0000 meq | INTRAVENOUS | Status: AC
  Administered 2022-05-03 – 2022-05-04 (×3): 10 meq via INTRAVENOUS
  Filled 2022-05-03 (×3): qty 100

## 2022-05-03 MED ORDER — SODIUM CHLORIDE 0.9 % IV BOLUS
1000.0000 mL | Freq: Once | INTRAVENOUS | Status: AC
  Administered 2022-05-03: 1000 mL via INTRAVENOUS

## 2022-05-03 NOTE — ED Triage Notes (Addendum)
Pt c/o headache, dizziness and swelling of both eyes. Ears feels "full"  Denies n/v  Pt states she does not have a hx of headaches Pt 1st BP was 82/60  Dizziness increases when standing

## 2022-05-04 ENCOUNTER — Emergency Department (HOSPITAL_BASED_OUTPATIENT_CLINIC_OR_DEPARTMENT_OTHER): Payer: Federal, State, Local not specified - PPO

## 2022-05-04 DIAGNOSIS — Z1152 Encounter for screening for COVID-19: Secondary | ICD-10-CM | POA: Diagnosis not present

## 2022-05-04 DIAGNOSIS — Z79890 Hormone replacement therapy (postmenopausal): Secondary | ICD-10-CM | POA: Diagnosis not present

## 2022-05-04 DIAGNOSIS — R001 Bradycardia, unspecified: Secondary | ICD-10-CM | POA: Diagnosis present

## 2022-05-04 DIAGNOSIS — Z91148 Patient's other noncompliance with medication regimen for other reason: Secondary | ICD-10-CM | POA: Diagnosis not present

## 2022-05-04 DIAGNOSIS — E876 Hypokalemia: Secondary | ICD-10-CM | POA: Diagnosis not present

## 2022-05-04 DIAGNOSIS — K219 Gastro-esophageal reflux disease without esophagitis: Secondary | ICD-10-CM | POA: Diagnosis not present

## 2022-05-04 DIAGNOSIS — E035 Myxedema coma: Secondary | ICD-10-CM | POA: Diagnosis not present

## 2022-05-04 DIAGNOSIS — D649 Anemia, unspecified: Secondary | ICD-10-CM | POA: Diagnosis not present

## 2022-05-04 DIAGNOSIS — R4182 Altered mental status, unspecified: Secondary | ICD-10-CM | POA: Diagnosis not present

## 2022-05-04 DIAGNOSIS — F319 Bipolar disorder, unspecified: Secondary | ICD-10-CM | POA: Diagnosis not present

## 2022-05-04 DIAGNOSIS — J4520 Mild intermittent asthma, uncomplicated: Secondary | ICD-10-CM | POA: Diagnosis not present

## 2022-05-04 DIAGNOSIS — F4310 Post-traumatic stress disorder, unspecified: Secondary | ICD-10-CM | POA: Diagnosis not present

## 2022-05-04 DIAGNOSIS — Z6835 Body mass index (BMI) 35.0-35.9, adult: Secondary | ICD-10-CM | POA: Diagnosis not present

## 2022-05-04 LAB — BASIC METABOLIC PANEL
Anion gap: 10 (ref 5–15)
BUN: <5 mg/dL — ABNORMAL LOW (ref 6–20)
CO2: 27 mmol/L (ref 22–32)
Calcium: 9.3 mg/dL (ref 8.9–10.3)
Chloride: 102 mmol/L (ref 98–111)
Creatinine, Ser: 0.8 mg/dL (ref 0.44–1.00)
GFR, Estimated: >60 mL/min (ref >60)
Glucose, Bld: 97 mg/dL (ref 70–99)
Potassium: 3.2 mmol/L — ABNORMAL LOW (ref 3.5–5.1)
Sodium: 139 mmol/L (ref 135–145)

## 2022-05-04 LAB — RAPID URINE DRUG SCREEN, HOSP PERFORMED
Amphetamines: NOT DETECTED
Barbiturates: NOT DETECTED
Benzodiazepines: NOT DETECTED
Cocaine: NOT DETECTED
Opiates: NOT DETECTED
Tetrahydrocannabinol: NOT DETECTED

## 2022-05-04 LAB — HIV ANTIBODY (ROUTINE TESTING W REFLEX): HIV Screen 4th Generation wRfx: NONREACTIVE

## 2022-05-04 LAB — TSH: TSH: 39.043 u[IU]/mL — ABNORMAL HIGH (ref 0.350–4.500)

## 2022-05-04 LAB — TROPONIN I (HIGH SENSITIVITY): Troponin I (High Sensitivity): 4 ng/L (ref <18)

## 2022-05-04 LAB — T4, FREE: Free T4: <0.25 ng/dL — ABNORMAL LOW (ref 0.61–1.12)

## 2022-05-04 MED ORDER — DEXAMETHASONE SODIUM PHOSPHATE 4 MG/ML IJ SOLN
4.0000 mg | Freq: Once | INTRAMUSCULAR | Status: AC
  Administered 2022-05-04: 4 mg via INTRAVENOUS
  Filled 2022-05-04: qty 1

## 2022-05-04 MED ORDER — ALBUTEROL SULFATE (2.5 MG/3ML) 0.083% IN NEBU: 2.5000 mg | INHALATION_SOLUTION | RESPIRATORY_TRACT | Status: DC | PRN

## 2022-05-04 MED ORDER — POTASSIUM CHLORIDE CRYS ER 20 MEQ PO TBCR
20.0000 meq | EXTENDED_RELEASE_TABLET | Freq: Two times a day (BID) | ORAL | Status: DC
  Administered 2022-05-05: 20 meq via ORAL
  Filled 2022-05-04: qty 1

## 2022-05-04 MED ORDER — ACETAMINOPHEN 500 MG PO TABS
1000.0000 mg | ORAL_TABLET | Freq: Four times a day (QID) | ORAL | Status: DC | PRN
  Administered 2022-05-05 (×2): 1000 mg via ORAL
  Filled 2022-05-04 (×2): qty 2

## 2022-05-04 MED ORDER — MONTELUKAST SODIUM 10 MG PO TABS
10.0000 mg | ORAL_TABLET | Freq: Every day | ORAL | Status: DC
  Administered 2022-05-05: 10 mg via ORAL
  Filled 2022-05-04: qty 1

## 2022-05-04 MED ORDER — LEVOTHYROXINE SODIUM 100 MCG PO TABS
200.0000 ug | ORAL_TABLET | Freq: Every day | ORAL | Status: DC
  Administered 2022-05-04 – 2022-05-05 (×2): 200 ug via ORAL
  Filled 2022-05-04 (×2): qty 2

## 2022-05-04 MED ORDER — POTASSIUM CHLORIDE CRYS ER 20 MEQ PO TBCR
40.0000 meq | EXTENDED_RELEASE_TABLET | ORAL | Status: AC
  Administered 2022-05-04 (×2): 40 meq via ORAL
  Filled 2022-05-04 (×2): qty 2

## 2022-05-04 NOTE — ED Provider Notes (Signed)
[facility] EMERGENCY DEPT Provider Note   CSN: [id number] Arrival date & time: 05/03/22  1826     History  Chief Complaint  Patient presents with   Dizziness   Headache    Colleen Peck is a 41 y.o. female with history of asthma, depression, PTSD, hypothyroidism, bipolar disorder who presents the emergency department complaining of dizziness, headache, and swelling of her eyes.  The symptoms started yesterday.  Patient states she does not often get headaches.  She has not felt nauseous or been vomiting.  She feels like her ears are "full".  No aggravating or alleviating factors.  My evaluation patient is lethargic, difficult to arouse for interview.  History and mother at bedside able to assist with history.  They state that the patient has a longstanding history of psychiatric disorders.  She recently has not wanted to be on any medication for this, and questionably not been on other medications that she supposed to.  They report that she has not been eating, as she feels she has not earned food if she is not contributing to the household.  She does not have a job right now.  Mother believes that she eats every now and then.  They have tried to talk about this with her as well as her medications, and she does have interest in discussing this.  She is currently in therapy.   Dizziness Associated symptoms: headaches   Associated symptoms: no chest pain   Headache Associated symptoms: dizziness        Home Medications Prior to Admission medications   Medication Sig Start Date End Date Taking? Authorizing Provider  albuterol (VENTOLIN HFA) 108 (90 Base) MCG/ACT inhaler INHALE 2 PUFFS INTO THE LUNGS EVERY 4 HOURS AS NEEDED FOR WHEEZING OR SHORTNESS OF BREATH 12/03/20   Benay Pike, MD  potassium chloride SA (KLOR-CON M) 20 MEQ tablet Take 1 tablet (20 mEq total) by mouth 2 (two) times daily. 04/09/22   Rancour, Annie Main, MD      Allergies    Abilify  [aripiprazole], Kiwi extract, and Meloxicam    Review of Systems   Review of Systems  Cardiovascular:  Negative for chest pain.  Neurological:  Positive for dizziness and headaches.  All other systems reviewed and are negative.   Physical Exam Updated Vital Signs BP 102/84   Pulse (!) 43   Temp 97.8 F (36.6 C) (Oral)   Resp 13   Ht '5\' 6"'$  (1.676 m)   Wt 108.9 kg   LMP 05/01/2022   SpO2 100%   BMI 38.74 kg/m  Physical Exam Vitals and nursing note reviewed.  Constitutional:      Appearance: Normal appearance.  HENT:     Head: Normocephalic and atraumatic.     Right Ear: Ear canal and external ear normal. There is impacted cerumen.     Left Ear: Tympanic membrane, ear canal and external ear normal.  Eyes:     Conjunctiva/sclera: Conjunctivae normal.  Cardiovascular:     Rate and Rhythm: Regular rhythm. Bradycardia present.  Pulmonary:     Effort: Pulmonary effort is normal. No respiratory distress.     Breath sounds: Normal breath sounds.  Abdominal:     General: There is no distension.     Palpations: Abdomen is soft.     Tenderness: There is no abdominal tenderness.  Skin:    General: Skin is warm and dry.  Neurological:     General: No focal deficit present.     Mental  Status: She is lethargic.     Comments: Patient can answer simple questions and follow basic commands. Somnolent but arousable.      ED Results / Procedures / Treatments   Labs (all labs ordered are listed, but only abnormal results are displayed) Labs Reviewed  CBC WITH DIFFERENTIAL/PLATELET - Abnormal; Notable for the following components:      Result Value   Hemoglobin 11.1 (*)    HCT 34.5 (*)    All other components within normal limits  COMPREHENSIVE METABOLIC PANEL - Abnormal; Notable for the following components:   Potassium 2.8 (*)    AST 74 (*)    ALT 74 (*)    All other components within normal limits  SARS CORONAVIRUS 2 BY RT PCR  ETHANOL  MAGNESIUM  HCG, QUANTITATIVE,  PREGNANCY  RAPID URINE DRUG SCREEN, HOSP PERFORMED  TSH  T4, FREE  TROPONIN I (HIGH SENSITIVITY)  TROPONIN I (HIGH SENSITIVITY)    EKG EKG Interpretation  Date/Time:  Monday May 03 2022 21:50:12 EDT Ventricular Rate:  56 PR Interval:  197 QRS Duration: 101 QT Interval:  440 QTC Calculation: 425 R Axis:   71 Text Interpretation: Sinus rhythm Borderline T wave abnormalities Confirmed by Nanda Quinton [phone]) on 05/03/2022 9:59:37 PM  Radiology CT Head Wo Contrast  Result Date: 05/03/2022 CLINICAL DATA:  Headache and dizziness. EXAM: CT HEAD WITHOUT CONTRAST TECHNIQUE: Contiguous axial images were obtained from the base of the skull through the vertex without intravenous contrast. RADIATION DOSE REDUCTION: This exam was performed according to the departmental dose-optimization program which includes automated exposure control, adjustment of the mA and/or kV according to patient size and/or use of iterative reconstruction technique. COMPARISON:  None Available. FINDINGS: Brain: No evidence of acute infarction, hemorrhage, hydrocephalus, extra-axial collection or mass lesion/mass effect. Vascular: No hyperdense vessel or unexpected calcification. Skull: Normal. Negative for fracture or focal lesion. Sinuses/Orbits: No acute finding. 6 mm and 7 mm osteomas are suspected within the left ethmoid sinus. Other: None. IMPRESSION: No acute intracranial pathology. Electronically Signed   By: Virgina Norfolk M.D.   On: 05/03/2022 22:42    Procedures Procedures    Medications Ordered in ED Medications  potassium chloride 10 mEq in 100 mL IVPB (10 mEq Intravenous New Bag/Given 05/04/22 0044)  sodium chloride 0.9 % bolus 1,000 mL (0 mLs Intravenous Stopped 05/03/22 2326)    ED Course/ Medical Decision Making/ A&P                           Medical Decision Making Amount and/or Complexity of Data Reviewed Labs: ordered. Radiology: ordered.  Risk Prescription drug management.  This  patient is a 41 y.o. female  who presents to the ED for concern of dizziness.   Differential diagnoses prior to evaluation: The emergent differential diagnosis includes, but is not limited to,  BPPV, vestibular migraine, head trauma, AVM, intracranial tumor, multiple sclerosis, drug-related, CVA, vasovagal syncope, orthostatic hypotension, sepsis, hypoglycemia, electrolyte disturbance, anemia, anxiety/panic attack. This is not an exhaustive differential.   Past Medical History / Co-morbidities: asthma, depression, PTSD, hypothyroidism, bipolar disorder  Additional history: Chart reviewed. Pertinent results include: Hx of vertigo, last had TSH check in March of this year  Physical Exam: Physical exam performed. The pertinent findings include: Somnolent but arousable. Persistently bradycardic. Protecting her airway  Lab Tests/Imaging studies: I personally interpreted labs/imaging and the pertinent results include: No leukocytosis, hemoglobin stable.  Potassium 2.8, normal magnesium.  Negative troponin.  Negative pregnancy.  Negative ethanol.  Negative COVID.  TSH, free T4, and UDS are pending.  CT imaging of the head shows no intracranial abnormalities.I agree with the radiologist interpretation.  Cardiac monitoring: EKG obtained and interpreted by my attending physician which shows: Sinus bradycardia   Medications: I ordered medication including IV fluids and potassium.  I have reviewed the patients home medicines and have made adjustments as needed.   Disposition: Patient discussed and care transferred to attending physician Dr. Christy Gentles at shift change. Please see his/her note for further details regarding further ED course and disposition. Plan at time of handoff is await thyroid testing. Concern for early thyroid disease with persistent bradycardia and relative hypotension. Anticipate patient will require admission. If thyroid testing is normal, patient may benefit from psychiatric  evaluation vs admission for new onset/symptomatic bradycardia.  I discussed this case with my attending physician Dr. Laverta Baltimore who cosigned this note including patient's presenting symptoms, physical exam, and planned diagnostics and interventions. Attending physician stated agreement with plan or made changes to plan which were implemented.     Final Clinical Impression(s) / ED Diagnoses Final diagnoses:  Bradycardia    Rx / DC Orders ED Discharge Orders     None      Portions of this report may have been transcribed using voice recognition software. Every effort was made to ensure accuracy; however, inadvertent computerized transcription errors may be present.    Estill Cotta 05/04/22 0046    Ripley Fraise, MD 05/04/22 [phone]

## 2022-05-04 NOTE — H&P (Signed)
History and Physical    Colleen Peck OZH:[id number] DOB: [birth year] DOA: 05/03/2022  PCP: Joya Gaskins, FNP (Confirm with patient/family/NH records and if not entered, this has to be entered at [facility] point of entry) Patient coming from: Home  I have personally briefly reviewed patient's old medical records in [place]  Chief Complaint: Feeling better  HPI: Colleen Peck is a 41 y.o. female with medical history significant of hypothyroidism noncompliant with Synthroid, chronic hypokalemia on supplement, mild intermittent asthma, obesity, bipolar disorder, PTSD, GERD with increasing weakness, bilateral eye swelling and mentation changes.  Patient admitted has not been compliant with Synthroid for unknown time, but unwilling to answer more questions about the reason behind.  Last night, patient developed lightheadedness blurry vision and confusion.  And family brought her to the ED.  ED Course: Patient was found to be profound bradycardia and hypotensive, heart rate lower 40s, blood pressure 82/60 been positive for orthostatic signs.  TSH> 30 and T4 undetectable.  K2.8.  Magnesium 2.2.  UDS negative.  Patient was started on Decadron, and received 1 dose of p.o. Synthroid and K supplement IV.  Review of Systems: As per HPI otherwise 14 point review of systems negative.    Past Medical History:  Diagnosis Date   Abnormal pap    pt reports abnl pap many years ago.  Nl since then.   Allergy    seasonal   Anemia    Asthma    Bipolar disorder ([place])    Depression    Palpitations 03/12/2008   PTSD (post-traumatic stress disorder)    Seasonal allergies    Thyroid disease 2009   Graves disease (pt reported resolved); hypothyriodism    Past Surgical History:  Procedure Laterality Date   DILATION AND CURETTAGE OF UTERUS  March 2006   LYMPH NODE BIOPSY Right 11/23/2019   Procedure: EXCISIONAL BIOPSY OF RIGHT NECK NODE;  Surgeon: Rozetta Nunnery, MD;  Location: [facility];  Service: ENT;  Laterality: Right;     reports that she has never smoked. She has never used smokeless tobacco. She reports current alcohol use. She reports that she does not currently use drugs after having used the following drugs: Marijuana.  Allergies  Allergen Reactions   Abilify [Aripiprazole] Other (See Comments)    AKATHISIA   Kiwi Extract Swelling   Meloxicam Rash    Family History  Problem Relation Age of Onset   Drug abuse Father    Depression Maternal Grandmother    Anxiety disorder Maternal Grandmother    COPD Maternal Grandmother    Suicidality Cousin    Depression Cousin    Bipolar disorder Cousin    Hypertension Mother    Depression Mother    Diabetes Paternal Grandfather    COPD Paternal Grandmother    Depression Maternal Aunt    Breast cancer Maternal Aunt    Depression Maternal Aunt    Heart disease Neg Hx     Prior to Admission medications   Medication Sig Start Date End Date Taking? Authorizing Provider  acetaminophen (TYLENOL) 500 MG tablet Take 1,000 mg by mouth every 6 (six) hours as needed.   Yes [provider]  montelukast (SINGULAIR) 10 MG tablet Take 10 mg by mouth daily. 08/28/21  Yes [provider]  potassium chloride SA (KLOR-CON M) 20 MEQ tablet Take 1 tablet (20 mEq total) by mouth 2 (two) times daily. 04/09/22  Yes Rancour, Annie Main, MD  albuterol (VENTOLIN HFA) 108 (90 Base) MCG/ACT  inhaler INHALE 2 PUFFS INTO THE LUNGS EVERY 4 HOURS AS NEEDED FOR WHEEZING OR SHORTNESS OF BREATH 12/03/20   Benay Pike, MD    Physical Exam: Vitals:   05/04/22 1230 05/04/22 1300 05/04/22 1400 05/04/22 1447  BP: (!) 118/90 109/79  (!) 113/91  Pulse: 65 73  68  Resp: '13 12  17  '$ Temp:    98.8 F (37.1 C)  TempSrc:    Oral  SpO2: 95% 96%  97%  Weight:   101 kg   Height:   '5\' 6"'$  (1.676 m)     Constitutional: NAD, calm, comfortable Vitals:   05/04/22 1230 05/04/22 1300 05/04/22 1400 05/04/22 1447  BP: (!)  118/90 109/79  (!) 113/91  Pulse: 65 73  68  Resp: '13 12  17  '$ Temp:    98.8 F (37.1 C)  TempSrc:    Oral  SpO2: 95% 96%  97%  Weight:   101 kg   Height:   '5\' 6"'$  (1.676 m)    Eyes: PERRL, lids and conjunctivae normal ENMT: Mucous membranes are moist. Posterior pharynx clear of any exudate or lesions.Normal dentition.  Neck: normal, supple, no masses, no thyromegaly Respiratory: clear to auscultation bilaterally, no wheezing, no crackles. Normal respiratory effort. No accessory muscle use.  Cardiovascular: Regular rate and rhythm, no murmurs / rubs / gallops. No extremity edema. 2+ pedal pulses. No carotid bruits.  Abdomen: no tenderness, no masses palpated. No hepatosplenomegaly. Bowel sounds positive.  Musculoskeletal: no clubbing / cyanosis. No joint deformity upper and lower extremities. Good ROM, no contractures. Normal muscle tone.  Skin: no rashes, lesions, ulcers. No induration Neurologic: CN 2-12 grossly intact. Sensation intact, DTR normal. Strength 5/5 in all 4.  Psychiatric: Normal judgment and insight. Alert and oriented x 3. Normal mood.     Labs on Admission: I have personally reviewed following labs and imaging studies  CBC: Recent Labs  Lab 05/03/22 1907  WBC 4.8  NEUTROABS 1.9  HGB 11.1*  HCT 34.5*  MCV 88.0  PLT 440   Basic Metabolic Panel: Recent Labs  Lab 05/03/22 1907 05/04/22 0728  NA 140 139  K 2.8* 3.2*  CL 99 102  CO2 30 27  GLUCOSE 91 97  BUN 6 <5*  CREATININE 0.90 0.80  CALCIUM 9.9 9.3  MG 2.2  --    GFR: Estimated Creatinine Clearance: 113.3 mL/min (by C-G formula based on SCr of 0.8 mg/dL). Liver Function Tests: Recent Labs  Lab 05/03/22 1907  AST 74*  ALT 74*  ALKPHOS 45  BILITOT 0.7  PROT 7.2  ALBUMIN 4.7   No results for input(s): "LIPASE", "AMYLASE" in the last 168 hours. No results for input(s): "AMMONIA" in the last 168 hours. Coagulation Profile: No results for input(s): "INR", "PROTIME" in the last 168  hours. Cardiac Enzymes: No results for input(s): "CKTOTAL", "CKMB", "CKMBINDEX", "TROPONINI" in the last 168 hours. BNP (last 3 results) No results for input(s): "PROBNP" in the last 8760 hours. HbA1C: No results for input(s): "HGBA1C" in the last 72 hours. CBG: No results for input(s): "GLUCAP" in the last 168 hours. Lipid Profile: No results for input(s): "CHOL", "HDL", "LDLCALC", "TRIG", "CHOLHDL", "LDLDIRECT" in the last 72 hours. Thyroid Function Tests: Recent Labs    05/04/22 0042  TSH 39.043*  FREET4 <0.25*   Anemia Panel: No results for input(s): "VITAMINB12", "FOLATE", "FERRITIN", "TIBC", "IRON", "RETICCTPCT" in the last 72 hours. Urine analysis:    Component Value Date/Time   COLORURINE AMBER (A) 04/09/2022 2208  APPEARANCEUR HAZY (A) 04/09/2022 2208   LABSPEC 1.030 04/09/2022 2208   PHURINE 5.0 04/09/2022 2208   GLUCOSEU NEGATIVE 04/09/2022 2208   HGBUR NEGATIVE 04/09/2022 2208   HGBUR negative 01/17/2009 1028   BILIRUBINUR NEGATIVE 04/09/2022 2208   BILIRUBINUR NEG 09/27/2013 1600   KETONESUR 5 (A) 04/09/2022 2208   PROTEINUR 100 (A) 04/09/2022 2208   UROBILINOGEN 0.2 02/10/2020 1714   NITRITE NEGATIVE 04/09/2022 2208   LEUKOCYTESUR NEGATIVE 04/09/2022 2208    Radiological Exams on Admission: DG Chest Portable 1 View  Result Date: 05/04/2022 CLINICAL DATA:  Weakness. Headache, dizziness and swelling of both eyes. EXAM: PORTABLE CHEST 1 VIEW COMPARISON:  04/09/2022 FINDINGS: The heart size and mediastinal contours are within normal limits. Both lungs are clear. The visualized skeletal structures are unremarkable. IMPRESSION: No active disease. Electronically Signed   By: Kerby Moors M.D.   On: 05/04/2022 05:20   CT Head Wo Contrast  Result Date: 05/03/2022 CLINICAL DATA:  Headache and dizziness. EXAM: CT HEAD WITHOUT CONTRAST TECHNIQUE: Contiguous axial images were obtained from the base of the skull through the vertex without intravenous contrast.  RADIATION DOSE REDUCTION: This exam was performed according to the departmental dose-optimization program which includes automated exposure control, adjustment of the mA and/or kV according to patient size and/or use of iterative reconstruction technique. COMPARISON:  None Available. FINDINGS: Brain: No evidence of acute infarction, hemorrhage, hydrocephalus, extra-axial collection or mass lesion/mass effect. Vascular: No hyperdense vessel or unexpected calcification. Skull: Normal. Negative for fracture or focal lesion. Sinuses/Orbits: No acute finding. 6 mm and 7 mm osteomas are suspected within the left ethmoid sinus. Other: None. IMPRESSION: No acute intracranial pathology. Electronically Signed   By: Virgina Norfolk M.D.   On: 05/03/2022 22:42    EKG: Independently reviewed.  Sinus bradycardia, no acute PR or QTc interval changes.  Assessment/Plan Principal Problem:   Sinus bradycardia Active Problems:   Myxedema   Bradycardia  (please populate well all problems here in Problem List. (For example, if patient is on BP meds at home and you resume or decide to hold them, it is a problem that needs to be her. Same for CAD, COPD, HLD and so on)  Myxedema -Secondary to noncompliant with Synthroid with baseline hypothyroidism -Much improved after 1 dose of Decadron and resuming her home dose of Synthroid. -Continue home dose of Synthroid 200 mcg daily, likely can be discharged home tomorrow.  Medically noncompliance -Notes of her PCP from care everywhere showed patient had 1 such episode earlier this year, when it was found that her TSH> 12 in March 2023.  At that point, appears that patient claimed that she has had new job schedule and shift, unable to take Synthroid regularly.  This time, when confronted with the patient, she would not provide me with answer why she is not noncompliant.  Given she also has history of bipolar/PTSD, raised concern whether patient psychiatry problem has been  sufficiently managed.  We will consult psychiatry, do not expect patient will need inpatient psychiatry unit at this point.  Hypokalemia -IV and p.o. replaced, recheck level tomorrow  Obesity -Calorie control recommended  History of bipolar and PTSD -As above  Mild intermittent asthma -Stable  DVT prophylaxis: SCD Code Status: Full code Family Communication: Mother at bedside Disposition Plan: Less than 2 days hospital stay Consults called: None Admission status: Telemetry observation   Lequita Halt MD [facility] Pager [phone]  05/04/2022, 4:36 PM

## 2022-05-04 NOTE — ED Provider Notes (Signed)
.  Critical Care  Performed by: Ripley Fraise, MD Authorized by: Ripley Fraise, MD   Critical care provider statement:    Critical care time (minutes):  40   Critical care start time:  05/04/2022 1:30 AM   Critical care end time:  05/04/2022 2:10 AM   Critical care time was exclusive of:  Separately billable procedures and treating other patients   Critical care was necessary to treat or prevent imminent or life-threatening deterioration of the following conditions:  Metabolic crisis and endocrine crisis   Critical care was time spent personally by me on the following activities:  Examination of patient, discussions with consultants, development of treatment plan with patient or surrogate, ordering and review of laboratory studies, re-evaluation of patient's condition and obtaining history from patient or surrogate   I assumed direction of critical care for this patient from another provider in my specialty: no     Care discussed with: admitting provider       Ripley Fraise, MD 05/04/22 0210

## 2022-05-04 NOTE — ED Provider Notes (Signed)
Ultrasound ED Echo  Date/Time: 05/04/2022 4:25 AM  Performed by: Ripley Fraise, MD Authorized by: Ripley Fraise, MD   Procedure details:    Indications: hypotension     Views: parasternal long axis view     Images: archived     Limitations:  Body habitus and positioning Findings:    Pericardium: small pericardial effusion   Impression:    Impression: normal    Due to persistent episodes of bradycardia questionable low voltage on EKG, I elected to perform limited bedside ultrasound to evaluate for pericardial effusion.  Patient has a small/trivial effusion, but no large effusion is noted.  Nurse [place] was present for the entire exam. We will also add on a chest x-ray to evaluate the cardiopulmonary structures   Ripley Fraise, MD 05/04/22 [phone]

## 2022-05-04 NOTE — ED Notes (Signed)
Handoff report given to Janne Napoleon on 3E at [facility]

## 2022-05-04 NOTE — Progress Notes (Signed)
  [facility] will assume care on arrival to accepting facility. Until arrival, care as per EDP. However, [facility] available 24/7 for questions and assistance.   Nursing staff please page [facility] Admits and Consults ([phone]) as soon as the patient arrives to the hospital.  Denee Boeder, DO [facility]  

## 2022-05-04 NOTE — ED Notes (Signed)
[facility] at bedside 

## 2022-05-05 ENCOUNTER — Encounter (HOSPITAL_COMMUNITY): Payer: Self-pay | Admitting: Internal Medicine

## 2022-05-05 DIAGNOSIS — E035 Myxedema coma: Secondary | ICD-10-CM | POA: Diagnosis not present

## 2022-05-05 DIAGNOSIS — R001 Bradycardia, unspecified: Secondary | ICD-10-CM | POA: Diagnosis not present

## 2022-05-05 LAB — BASIC METABOLIC PANEL
Anion gap: 5 (ref 5–15)
BUN: <5 mg/dL — ABNORMAL LOW (ref 6–20)
CO2: 26 mmol/L (ref 22–32)
Calcium: 9.4 mg/dL (ref 8.9–10.3)
Chloride: 108 mmol/L (ref 98–111)
Creatinine, Ser: 0.94 mg/dL (ref 0.44–1.00)
GFR, Estimated: >60 mL/min (ref >60)
Glucose, Bld: 86 mg/dL (ref 70–99)
Potassium: 3.8 mmol/L (ref 3.5–5.1)
Sodium: 139 mmol/L (ref 135–145)

## 2022-05-05 MED ORDER — LEVOTHYROXINE SODIUM 200 MCG PO TABS: 200.0000 ug | ORAL_TABLET | Freq: Every day | ORAL | 2 refills | Status: DC

## 2022-05-05 NOTE — Discharge Summary (Signed)
Physician Discharge Summary  Colleen Peck OFB:[id number] DOB: 06/01/1981 DOA: 05/03/2022  PCP: Joya Gaskins, FNP  Admit date: 05/03/2022 Discharge date: 05/05/2022  Time spent: 33 minutes  Recommendations for Outpatient Follow-up:  Get TSH T4 in 3 weeks and adjust dose of synthroid Please ensure that patient does follow with psych as OP May use debrox for ear issues  Discharge Diagnoses:  MAIN problem for hospitalization   Severe hypothyroid causing some myxedema and balance concerns Body mass index is 35.94 kg/m. Prior PTSD and anxiety anemia   Please see below for itemized issues addressed in [place]- refer to other progress notes for clarity if needed  Discharge Condition: improved  Diet recommendation: hh  Filed Weights   05/03/22 1850 05/04/22 1400  Weight: 108.9 kg 101 kg    History of present illness:  41 y/o fem with prior PTSD and anxiety and hypothyroid not compliant on synthroid presented with mild mental status changes, bilat eye swelling   She developed blurred vision and lightheadedness leading to visit to the ED  She was found to be bradycardic, hypotensive and + orthostatically TSH >30, T$ undetectable K 2.8 Mag wnl  She was admitted--given a dose of IV decadron and improved significantly and synthroid resumed  Psychiatry was consulted given her Pre-morbid diagnoses of PTSD and anxiety and it was noted these choices being made were personal choices--nothing to do with her psychiatric prior diagnoses  We had a long discussion with her and counseled her to cresume her synthroid, which was called in for her,  she was able to ambulat in hallways but was slightly dizzy and was advised to start low go slowly  I discussed the same with her mothe rin the room and patient was stable for d/c home  Discharge Exam: Vitals:   05/05/22 1219 05/05/22 1326  BP:    Pulse: 60   Resp:  18  Temp:  98.6 F (37 C)  SpO2: 99%     Subj on day of  d/c   Awake coherent in and no focal deficit  General Exam on discharge  Eomi ncat mild peri-orbital edema Cta b no added sound rale swheeze rhonchi Abd soft nt nd no rebound no guard obese nt nd Mild edema Power 5/5  Discharge Instructions    Allergies as of 05/05/2022       Reactions   Abilify [aripiprazole] Other (See Comments)   AKATHISIA   Kiwi Extract Swelling   Meloxicam Rash        Medication List     TAKE these medications    acetaminophen 500 MG tablet Commonly known as: TYLENOL Take 1,000 mg by mouth every 6 (six) hours as needed.   albuterol 108 (90 Base) MCG/ACT inhaler Commonly known as: VENTOLIN HFA INHALE 2 PUFFS INTO THE LUNGS EVERY 4 HOURS AS NEEDED FOR WHEEZING OR SHORTNESS OF BREATH   levothyroxine 200 MCG tablet Commonly known as: SYNTHROID Take 1 tablet (200 mcg total) by mouth daily at 6 (six) AM. Start taking on: May 06, 2022   montelukast 10 MG tablet Commonly known as: SINGULAIR Take 10 mg by mouth daily.   potassium chloride SA 20 MEQ tablet Commonly known as: KLOR-CON M Take 1 tablet (20 mEq total) by mouth 2 (two) times daily.       Allergies  Allergen Reactions   Abilify [Aripiprazole] Other (See Comments)    AKATHISIA   Kiwi Extract Swelling   Meloxicam Rash      The results of significant  diagnostics from this hospitalization (including imaging, microbiology, ancillary and laboratory) are listed below for reference.    Significant Diagnostic Studies: DG Chest Portable 1 View  Result Date: 05/04/2022 CLINICAL DATA:  Weakness. Headache, dizziness and swelling of both eyes. EXAM: PORTABLE CHEST 1 VIEW COMPARISON:  04/09/2022 FINDINGS: The heart size and mediastinal contours are within normal limits. Both lungs are clear. The visualized skeletal structures are unremarkable. IMPRESSION: No active disease. Electronically Signed   By: Kerby Moors M.D.   On: 05/04/2022 05:20   CT Head Wo Contrast  Result Date:  05/03/2022 CLINICAL DATA:  Headache and dizziness. EXAM: CT HEAD WITHOUT CONTRAST TECHNIQUE: Contiguous axial images were obtained from the base of the skull through the vertex without intravenous contrast. RADIATION DOSE REDUCTION: This exam was performed according to the departmental dose-optimization program which includes automated exposure control, adjustment of the mA and/or kV according to patient size and/or use of iterative reconstruction technique. COMPARISON:  None Available. FINDINGS: Brain: No evidence of acute infarction, hemorrhage, hydrocephalus, extra-axial collection or mass lesion/mass effect. Vascular: No hyperdense vessel or unexpected calcification. Skull: Normal. Negative for fracture or focal lesion. Sinuses/Orbits: No acute finding. 6 mm and 7 mm osteomas are suspected within the left ethmoid sinus. Other: None. IMPRESSION: No acute intracranial pathology. Electronically Signed   By: Virgina Norfolk M.D.   On: 05/03/2022 22:42   DG Chest 2 View  Result Date: 04/09/2022 CLINICAL DATA:  Fall several weeks ago with chest pain, initial encounter EXAM: CHEST - 2 VIEW COMPARISON:  12/05/2020 FINDINGS: Cardiac shadow is within normal limits. Lungs are clear with the exception of small posteriorly layering effusions similar to that seen on recent chest x-ray. No bony abnormality is noted. IMPRESSION: Small bilateral effusions.  No acute abnormality noted. Electronically Signed   By: Inez Catalina M.D.   On: 04/09/2022 23:35   CT Renal Stone Study  Result Date: 04/09/2022 CLINICAL DATA:  Right-sided flank pain and dark colored urine, initial encounter EXAM: CT ABDOMEN AND PELVIS WITHOUT CONTRAST TECHNIQUE: Multidetector CT imaging of the abdomen and pelvis was performed following the standard protocol without IV contrast. RADIATION DOSE REDUCTION: This exam was performed according to the departmental dose-optimization program which includes automated exposure control, adjustment of the mA  and/or kV according to patient size and/or use of iterative reconstruction technique. COMPARISON:  None Available. FINDINGS: Lower chest: Small bilateral pleural effusions are seen. No focal infiltrate or parenchymal nodules are noted. Hepatobiliary: No focal liver abnormality is seen. No gallstones, gallbladder wall thickening, or biliary dilatation. Pancreas: Unremarkable. No pancreatic ductal dilatation or surrounding inflammatory changes. Spleen: Normal in size without focal abnormality. Adrenals/Urinary Tract: Adrenal glands are within normal limits. Kidneys show no renal calculi or obstructive changes. The bladder is decompressed. Stomach/Bowel: No obstructive or inflammatory changes of colon are noted. The appendix is within normal limits. No inflammatory changes are seen. Small bowel and stomach are within normal limits. Vascular/Lymphatic: No significant vascular findings are present. No enlarged abdominal or pelvic lymph nodes. Reproductive: Uterus and bilateral adnexa are unremarkable. Other: No abdominal wall hernia or abnormality. No abdominopelvic ascites. Musculoskeletal: No acute or significant osseous findings. IMPRESSION: Small bilateral pleural effusions. No renal calculi or No other focal abnormality is seen. Electronically Signed   By: Inez Catalina M.D.   On: 04/09/2022 22:42    Microbiology: Recent Results (from the past 240 hour(s))  SARS Coronavirus 2 by RT PCR (hospital order, performed in [facility] hospital lab) *cepheid single result test* Anterior  Nasal Swab     Status: None   Collection Time: 05/03/22  7:06 PM   Specimen: Anterior Nasal Swab  Result Value Ref Range Status   SARS Coronavirus 2 by RT PCR NEGATIVE NEGATIVE Final    Comment: (NOTE) SARS-CoV-2 target nucleic acids are NOT DETECTED.  The SARS-CoV-2 RNA is generally detectable in upper and lower respiratory specimens during the acute phase of infection. The lowest concentration of SARS-CoV-2 viral copies this  assay can detect is 250 copies / mL. A negative result does not preclude SARS-CoV-2 infection and should not be used as the sole basis for treatment or other patient management decisions.  A negative result may occur with improper specimen collection / handling, submission of specimen other than nasopharyngeal swab, presence of viral mutation(s) within the areas targeted by this assay, and inadequate number of viral copies (<250 copies / mL). A negative result must be combined with clinical observations, patient history, and epidemiological information.  Fact Sheet for Patients:   https://www.patel.info/  Fact Sheet for Healthcare Providers: https://hall.com/  This test is not yet approved or  cleared by the [place] FDA and has been authorized for detection and/or diagnosis of SARS-CoV-2 by FDA under an Emergency Use Authorization (EUA).  This EUA will remain in effect (meaning this test can be used) for the duration of the COVID-19 declaration under Section 564(b)(1) of the Act, 21 U.S.C. section 360bbb-3(b)(1), unless the authorization is terminated or revoked sooner.  Performed at [facility], [address], [place], [place] [address]      Labs: Basic Metabolic Panel: Recent Labs  Lab 05/03/22 1907 05/04/22 0728 05/05/22 0253  NA 140 139 139  K 2.8* 3.2* 3.8  CL 99 102 108  CO2 '30 27 26  '$ GLUCOSE 91 97 86  BUN 6 <5* <5*  CREATININE 0.90 0.80 0.94  CALCIUM 9.9 9.3 9.4  MG 2.2  --   --    Liver Function Tests: Recent Labs  Lab 05/03/22 1907  AST 74*  ALT 74*  ALKPHOS 45  BILITOT 0.7  PROT 7.2  ALBUMIN 4.7   No results for input(s): "LIPASE", "AMYLASE" in the last 168 hours. No results for input(s): "AMMONIA" in the last 168 hours. CBC: Recent Labs  Lab 05/03/22 1907  WBC 4.8  NEUTROABS 1.9  HGB 11.1*  HCT 34.5*  MCV 88.0  PLT 200   Cardiac Enzymes: No results for input(s):  "CKTOTAL", "CKMB", "CKMBINDEX", "TROPONINI" in the last 168 hours. BNP: BNP (last 3 results) No results for input(s): "BNP" in the last 8760 hours.  ProBNP (last 3 results) No results for input(s): "PROBNP" in the last 8760 hours.  CBG: No results for input(s): "GLUCAP" in the last 168 hours.     Signed:  Nita Sells MD   [facility] 05/05/2022, 2:48 PM

## 2022-05-05 NOTE — Progress Notes (Signed)
   05/05/22 1502  Mobility  Activity Ambulated with assistance in hallway  Level of Assistance Modified independent, requires aide device or extra time  Assistive Device Other (Comment) (Hall rails)  Distance Ambulated (ft) 200 ft  Activity Response Tolerated well  Mobility Referral Yes  $Mobility charge 1 Mobility   Mobility Specialist Progress Note  Pt was in bed and agreeable. Felt somewhat unsteady while ambulating. Returned to bed w/ MD in room.   Lucious Groves Mobility Specialist

## 2022-05-05 NOTE — Hospital Course (Addendum)
Believes holistic medicine Wasn't taking synthroid because of hair loss (although hypothyroidism leads to hair loss) Hypothyroidism  Therapist/Psychiatry: ???  "I don't want to say too much" "I want to go a different route" A/Ox4

## 2022-05-05 NOTE — Consult Note (Addendum)
[facility] New Face-to-Face Psychiatric Evaluation   Service Date: May 05, 2022 LOS:  LOS: 0 days    Assessment  Colleen Peck is a 41 y.o. female admitted medically for 05/03/2022  7:48 PM for myxedema coma and bradycardia. She carries the psychiatric diagnoses of questionable bipolar I, questionable schizoaffective disorder, PTSD, and anxiety and has a past medical history of hypothyroidism, asthma, anemia.Psychiatry was consulted for "Bipolar has frequent noncompliant with thyroid medications, suspect bipolar not sufficiently treated" by Colleen Fines T.    Her current presentation of noncompliance with medications is most consistent with personal choice rather than poor judgement 2/2 unmanaged psychiatric condition. Not on any outpatient psychotropic medicationsand historically she has tried many medications she feels were unhelpful. She was not compliant with thyroid medications prior to admission as evidenced by TSH 39 and nonexistent free T4. On initial examination, patient is alert, oriented, with fair judgement. Please see plan below for detailed recommendations.   Diagnoses:  Active Hospital problems: Principal Problem:   Sinus bradycardia Active Problems:   Myxedema   Bradycardia     Plan  ## Safety and Observation Level:  - Based on my clinical evaluation, I estimate the patient to be at minimal risk of self harm in the current setting. Does not meet IVC criteria.  ## History of BPD -Outpatient psychiatry (states she follows "Colleen Peck" in [place] for mental health problems but remains vague about actual name)  ## Myxedema Coma:  -- levothyroxine 200 mcg  ## Medical Decision Making Capacity:  She understands the rishs and benefits of taking her synthyroid. She understands noncompliance could lead to another myxedema coma which could be fatal. We reviewed that hypothyroidism can also cause hair loss. She plans to resume taking the synthyroid.   --  most recent EKG on 05/03/22 had QtC of 425 -- Pertinent labwork reviewed earlier this admission includes:  -TSH 39.043 - T4 free <0.25 - K 2.8 - HBG 11.1 - negative UDS  ## Disposition:  -- discharge home when medically stable   Thank you for this consult request. Recommendations have been communicated to the primary team.  We will sign off at this time.   Colleen Peck, Medical Student   NEW  history  Relevant Aspects of Hospital Course:  Admitted on 05/03/2022 for bradycardia and myxedema coma.  Patient Report:  She presented to the ED with myxedema 2/2 noncompliance with synthroid for hypothyroidism. She had a similar episode earlier this year. On discussion, the patient explains that she chose to stop taking her synthyroid because she felt it was making her hair fall out and she wanted to find a holistic alternative. She mentions frustration with unnatural synthetic medications that cause side effects and she was hoping she would be able to address her hypothyroidism on her own without prescription medication.   She expressed understanding that her body is not making enough thyroid hormone and that is important for regulating body functions. She expressed understanding that noncompliance with medication can lead to another myxedema coma, complete loss of consciousness, and death. She still feels frustrated with her perceived side effects of the synthyroid but says she plans to resume compliance because she wants to avoid another hospitalization.   She has a inconsistent psychiatric history on chart review including bipolar 1, schizoaffective disorder, BPD which she disagrees with. She strongly would like these removed from her chart because she feels they are inaccurate and stigmatizing. She feels these diagnoses were rushed, did not  align with her clinical picture, and different doctors she has seen disagree with them. She has been on many psych medications in the past and does not  feel they have helped. She reports currently working with an outpatient doctor and therapist to understand her situation. It is unclear if these are licensed medical professionals, or alterative medicine. She denies any SI, HI. She does have prior IPP  She has not recently been on any antipsychotics and has not had any relapse psychotic episodes. This does not appear consistent with schizoaffective disorder, will remove from history. She also does not appear consistent with bipolar 1, will remove from history.   ROS:  Denies AH, VH, SI, HI.   Collateral information:  Mother was in room, occasionally providing supportive information.   Psychiatric History:  Information collected from patient  Family psych history:   - suicidality: cousin  - bipolar disorder: cousin - depression: mother, maternal aunt, grandmother, cousin  - drug abuse: father   Social History:  She previously worked in the [employer], medical. She likes to dress for the season and cares about fashion.   Drug use: remote history of marijuana use  Family History:  The patient's family history includes Anxiety disorder in her maternal grandmother; Bipolar disorder in her cousin; Breast cancer in her maternal aunt; COPD in her maternal grandmother and paternal grandmother; Depression in her cousin, maternal aunt, maternal aunt, maternal grandmother, and mother; Diabetes in her paternal grandfather; Drug abuse in her father; Hypertension in her mother; Suicidality in her cousin.  Medical History: Past Medical History:  Diagnosis Date   Abnormal pap    pt reports abnl pap many years ago.  Nl since then.   Allergy    seasonal   Anemia    Asthma    Bipolar disorder ([place])    Depression    Palpitations 03/12/2008   PTSD (post-traumatic stress disorder)    Seasonal allergies    Thyroid disease 2009   Graves disease (pt reported resolved); hypothyriodism    Surgical History: Past Surgical History:  Procedure Laterality  Date   DILATION AND CURETTAGE OF UTERUS  March 2006   LYMPH NODE BIOPSY Right 11/23/2019   Procedure: EXCISIONAL BIOPSY OF RIGHT NECK NODE;  Surgeon: Rozetta Nunnery, MD;  Location: [facility];  Service: ENT;  Laterality: Right;    Medications:   Current Facility-Administered Medications:    acetaminophen (TYLENOL) tablet 1,000 mg, 1,000 mg, Oral, Q6H PRN, Colleen Fines T, MD, 1,000 mg at 05/05/22 0505   albuterol (PROVENTIL) (2.5 MG/3ML) 0.083% nebulizer solution 2.5 mg, 2.5 mg, Inhalation, Q4H PRN, Colleen Fines T, MD   levothyroxine (SYNTHROID) tablet 200 mcg, 200 mcg, Oral, Q0600, Ripley Fraise, MD, 200 mcg at 05/05/22 0924   montelukast (SINGULAIR) tablet 10 mg, 10 mg, Oral, Daily, Colleen Fines T, MD, 10 mg at 05/05/22 0924   potassium chloride SA (KLOR-CON M) CR tablet 20 mEq, 20 mEq, Oral, BID, Colleen Fines T, MD, 20 mEq at 05/05/22 7915  Allergies: Allergies  Allergen Reactions   Abilify [Aripiprazole] Other (See Comments)    AKATHISIA   Kiwi Extract Swelling   Meloxicam Rash       Objective  Vital signs:  Temp:  [97.6 F (36.4 C)-98.8 F (37.1 C)] 98.4 F (36.9 C) (10/25 0723) Pulse Rate:  [51-73] 64 (10/25 0723) Resp:  [11-18] 18 (10/25 0723) BP: (91-118)/(62-91) 102/75 (10/25 0723) SpO2:  [95 %-100 %] 100 % (10/25 0723) Weight:  [101 kg] 101 kg (  10/24 1400)  Psychiatric Specialty Exam:  Presentation  General Appearance:  Appropriate for Environment (in hospital gown and sweater, sitting up in bed. Apoligized for "looking rough" with outfit not coordinated to season due to limited options since being hospitalized.)  Eye Contact: Good  Speech: Clear and Coherent  Speech Volume: Normal  Handedness: Right   Mood and Affect  Mood: Anxious (Mildly anxious but polite)  Affect: Appropriate   Thought Process  Thought Processes: Coherent; Goal Directed (answered questions clearly but wanted to be very clear so she was not  misunderstood.)  Descriptions of Associations:Intact  Orientation:Full (Time, Place and Person)  Thought Content:Logical (Feels that some of her prior psychiatric diagnoses were inaccurate based on inconcistancies with her presentation and her research including age of presentation, symptoms, and ability to function.)  History of Schizophrenia/Schizoaffective disorder:Yes  Duration of Psychotic Symptoms:Less than six months  Hallucinations:Hallucinations: None  Ideas of Reference:None  Suicidal Thoughts:Suicidal Thoughts: No  Homicidal Thoughts:Homicidal Thoughts: No   Sensorium  Memory: Immediate Good; Remote Good; Recent Fair (Does not comeletely remember events of her ED presentation for myxedema coma, but remembers most)  Judgment: Good  Insight: Good (believes she is sensitive with strong feelings at times.)   Executive Functions  Concentration: Good  Attention Span: Good  Recall: Good  Fund of Knowledge: Good  Language: Good   Psychomotor Activity  Psychomotor Activity:Psychomotor Activity: Normal   Assets  Assets: Communication Skills; Social Support   Sleep  Sleep:No data recorded   Physical Exam: Physical Exam Constitutional:      Appearance: She is obese.  HENT:     Head: Normocephalic.  Eyes:     General: No scleral icterus.    Extraocular Movements: Extraocular movements intact.  Pulmonary:     Effort: Pulmonary effort is normal.  Musculoskeletal:     Cervical back: Normal range of motion.  Neurological:     Mental Status: She is alert and oriented to person, place, and time.     GCS: GCS eye subscore is 4. GCS verbal subscore is 5. GCS motor subscore is 6.  Psychiatric:        Attention and Perception: Attention and perception normal. She does not perceive auditory or visual hallucinations.        Mood and Affect: Affect normal. Mood is anxious.        Speech: Speech normal.        Behavior: Behavior normal. Behavior is  cooperative.        Thought Content: Thought content normal.        Cognition and Memory: Cognition and memory normal.        Judgment: Judgment normal.    ROS Blood pressure 102/75, pulse 64, temperature 98.4 F (36.9 C), temperature source Oral, resp. rate 18, height '5\' 6"'$  (1.676 m), weight 101 kg, last menstrual period 05/01/2022, SpO2 100 %. Body mass index is 35.94 kg/m.   Attestation for Student Documentation:  I personally was present and performed or re-performed the history, physical exam and medical decision-making activities of this service and have verified that the service and findings are accurately documented in the student's note.  France Ravens, MD 05/05/2022, 12:33 PM

## 2022-05-05 NOTE — TOC Progression Note (Addendum)
Transition of Care [facility]) - Progression Note    Patient Details  Name: Colleen Peck MRN: [id number] Date of Birth: [birth year]  Transition of Care [facility]) CM/SW [place], [place] Phone Number: 05/05/2022, 4:13 PM  Clinical Narrative:     CSW received call from [facility] explaining that pt is in need of shelter resources. CSW met with pt and pt's mother in room; RNCM already discussing with pt and her mom. Pt states she lives with her mom but does not want to return there. She is vague when explaining why. She explains it isn't good for her there and expresses that she does not want to disclose more. She is also vague about whether or not she would be returning to her mothers house at [place]. She indicates that if she was unable to get into a shelter that she would likely return home with her mom.   CSW discusses shelter options for single women in [place](they are very limited). Pt states she has already been trying to get into [facility] which require in person walk-in appointments. CSW discusses [facility] [employer] [facility] as an option; CSW calls and leaves message with [employer] requesting return call. CSW encouraged pt to call [facility] herself as it would indicate to [place] that she is motivated herself to get into a shelter and that shelters often prefer to communicate directly with the individual in need.   CSW explains potential options outside of [place]. When discussing other potential options in [facility], pt states she would not go to a shelter there. CSW then discusses a shelter in [place] asks patient if she would go to a shelter in [place] if they had a bed. Pt responds with unclear answer and is vague about whether she would go or not. She then asks about options in [place]. CSW explains he is not familiar with resources in [place] but would be willing to search for shelters online. Pt explains that she would like printed list of shelters and that she would  call and follow up with them. She then requests additional resources including for employment and food.   CSW stepped out of the room and printed shelter lists for [place], [place] and [place] also provided Geologist, engineering for food/nutrition as well as a Arboriculturist for [facility]  that included resources for food and employment. Pt asks if CSW found shelter resources in other parts of [place] though does not provide a specific city/county that she is wanting shelter information for. CSW explains he cannot provide a list that encompasses shelters for all cities of [place].   Expected Discharge Plan: Home/Self Care Barriers to Discharge: No Barriers Identified  Expected Discharge Plan and Services Expected Discharge Plan: Home/Self Care       Living arrangements for the past 2 months: [place] Expected Discharge Date: 05/05/22                                     Social Determinants of Health (SDOH) Interventions Food Insecurity Interventions: Intervention Not Indicated  Readmission Risk Interventions     No data to display

## 2022-05-16 ENCOUNTER — Other Ambulatory Visit: Payer: Self-pay

## 2022-05-16 ENCOUNTER — Encounter (HOSPITAL_COMMUNITY): Payer: Self-pay | Admitting: Emergency Medicine

## 2022-05-16 ENCOUNTER — Emergency Department (EMERGENCY_DEPARTMENT_HOSPITAL)
Admission: EM | Admit: 2022-05-16 | Discharge: 2022-05-17 | Disposition: A | Discharge status: Other Acute Inpatient Hospital | Payer: Federal, State, Local not specified - PPO | Source: Home / Self Care | Attending: Emergency Medicine | Admitting: Emergency Medicine

## 2022-05-16 DIAGNOSIS — R462 Strange and inexplicable behavior: Secondary | ICD-10-CM

## 2022-05-16 DIAGNOSIS — N9489 Other specified conditions associated with female genital organs and menstrual cycle: Secondary | ICD-10-CM | POA: Insufficient documentation

## 2022-05-16 DIAGNOSIS — F319 Bipolar disorder, unspecified: Secondary | ICD-10-CM | POA: Diagnosis not present

## 2022-05-16 DIAGNOSIS — R4689 Other symptoms and signs involving appearance and behavior: Secondary | ICD-10-CM

## 2022-05-16 DIAGNOSIS — Z1152 Encounter for screening for COVID-19: Secondary | ICD-10-CM | POA: Insufficient documentation

## 2022-05-16 DIAGNOSIS — R456 Violent behavior: Secondary | ICD-10-CM | POA: Insufficient documentation

## 2022-05-16 DIAGNOSIS — R461 Bizarre personal appearance: Secondary | ICD-10-CM | POA: Insufficient documentation

## 2022-05-16 DIAGNOSIS — R4182 Altered mental status, unspecified: Secondary | ICD-10-CM | POA: Insufficient documentation

## 2022-05-16 LAB — RAPID URINE DRUG SCREEN, HOSP PERFORMED
Amphetamines: NOT DETECTED
Barbiturates: NOT DETECTED
Benzodiazepines: NOT DETECTED
Cocaine: NOT DETECTED
Opiates: NOT DETECTED
Tetrahydrocannabinol: NOT DETECTED
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
## 2022-05-16 NOTE — BH Assessment (Signed)
Per RN, Pt was not comfortable using tele-cart. Attempted to assess Pt via telephone but she still would not speak. Per RN, Pt confirmed she would be willing to speak to a provider in person. Day shift psychiatric NP will see Pt in the morning.   Evelena Peat, [facility], [facility] Triage Specialist [phone]

## 2022-05-16 NOTE — ED Notes (Addendum)
Pt agreeable to providing urine sample, but not blood work. [employer] still at bedside.

## 2022-05-16 NOTE — ED Notes (Signed)
When asked if it was okay to perform the EKG, pt stated that she didn't need one and did not consent to it being performed. RN and PA notified.

## 2022-05-16 NOTE — ED Notes (Signed)
Pt wandering away from assigned area. [employer] redirected patient back to area.

## 2022-05-16 NOTE — ED Notes (Signed)
RN introduced self to pt. Pt standing near bed with arms crossed and states "I am going to stand." RN stated that there is no problem with standing if that makes her more comfortable. Food, drink, and warm blankets offered, but were declined by patient. Pt stated that she will not be providing blood work at this time. Pt asked why she cannot just go straight to [facility], RN highlighted that blood work is needed prior to transfer to any facility as medical clearance is standard procedure. RN to speak with MD to assist in encouraging patient to allow blood draw and avoid escalation. [employer] remains at bedside. Pt remains tense and defensive, but relatively calm at this time.

## 2022-05-17 ENCOUNTER — Inpatient Hospital Stay (HOSPITAL_COMMUNITY)
Admission: RE | Admit: 2022-05-17 | Discharge: 2022-05-19 | DRG: 880 | Disposition: A | Discharge status: Home / Self Care | Payer: Federal, State, Local not specified - PPO | Attending: Psychiatry | Admitting: Psychiatry

## 2022-05-17 DIAGNOSIS — E039 Hypothyroidism, unspecified: Secondary | ICD-10-CM | POA: Diagnosis present

## 2022-05-17 DIAGNOSIS — F4310 Post-traumatic stress disorder, unspecified: Secondary | ICD-10-CM | POA: Diagnosis present

## 2022-05-17 DIAGNOSIS — Z79890 Hormone replacement therapy (postmenopausal): Secondary | ICD-10-CM

## 2022-05-17 DIAGNOSIS — E0500 Thyrotoxicosis with diffuse goiter without thyrotoxic crisis or storm: Secondary | ICD-10-CM | POA: Diagnosis present

## 2022-05-17 DIAGNOSIS — N9489 Other specified conditions associated with female genital organs and menstrual cycle: Secondary | ICD-10-CM | POA: Diagnosis present

## 2022-05-17 DIAGNOSIS — R456 Violent behavior: Principal | ICD-10-CM | POA: Diagnosis present

## 2022-05-17 DIAGNOSIS — J45909 Unspecified asthma, uncomplicated: Secondary | ICD-10-CM | POA: Diagnosis present

## 2022-05-17 DIAGNOSIS — F319 Bipolar disorder, unspecified: Secondary | ICD-10-CM | POA: Diagnosis present

## 2022-05-17 DIAGNOSIS — Z818 Family history of other mental and behavioral disorders: Secondary | ICD-10-CM

## 2022-05-17 DIAGNOSIS — R4182 Altered mental status, unspecified: Secondary | ICD-10-CM | POA: Insufficient documentation

## 2022-05-17 DIAGNOSIS — F6381 Intermittent explosive disorder: Secondary | ICD-10-CM | POA: Diagnosis present

## 2022-05-17 DIAGNOSIS — F603 Borderline personality disorder: Secondary | ICD-10-CM | POA: Diagnosis present

## 2022-05-17 DIAGNOSIS — Z79899 Other long term (current) drug therapy: Secondary | ICD-10-CM

## 2022-05-17 DIAGNOSIS — Z91199 Patient's noncompliance with other medical treatment and regimen due to unspecified reason: Secondary | ICD-10-CM

## 2022-05-17 DIAGNOSIS — F4312 Post-traumatic stress disorder, chronic: Secondary | ICD-10-CM | POA: Diagnosis present

## 2022-05-17 DIAGNOSIS — F411 Generalized anxiety disorder: Secondary | ICD-10-CM | POA: Diagnosis present

## 2022-05-17 DIAGNOSIS — Z20822 Contact with and (suspected) exposure to covid-19: Secondary | ICD-10-CM | POA: Diagnosis present

## 2022-05-17 DIAGNOSIS — Z91148 Patient's other noncompliance with medication regimen for other reason: Secondary | ICD-10-CM

## 2022-05-17 DIAGNOSIS — R4689 Other symptoms and signs involving appearance and behavior: Principal | ICD-10-CM | POA: Insufficient documentation

## 2022-05-17 DIAGNOSIS — R462 Strange and inexplicable behavior: Secondary | ICD-10-CM | POA: Diagnosis not present

## 2022-05-17 LAB — COMPREHENSIVE METABOLIC PANEL
ALT: 121 U/L — ABNORMAL HIGH (ref 0–44)
AST: 64 U/L — ABNORMAL HIGH (ref 15–41)
Albumin: 4.9 g/dL (ref 3.5–5.0)
Alkaline Phosphatase: 60 U/L (ref 38–126)
Anion gap: 11 (ref 5–15)
BUN: 7 mg/dL (ref 6–20)
CO2: 24 mmol/L (ref 22–32)
Calcium: 10.2 mg/dL (ref 8.9–10.3)
Chloride: 102 mmol/L (ref 98–111)
Creatinine, Ser: 0.76 mg/dL (ref 0.44–1.00)
GFR, Estimated: >60 mL/min (ref >60)
Glucose, Bld: 93 mg/dL (ref 70–99)
Potassium: 3.3 mmol/L — ABNORMAL LOW (ref 3.5–5.1)
Sodium: 137 mmol/L (ref 135–145)
Total Bilirubin: 0.8 mg/dL (ref 0.3–1.2)
Total Protein: 7.9 g/dL (ref 6.5–8.1)

## 2022-05-17 LAB — CBC WITH DIFFERENTIAL/PLATELET
Abs Immature Granulocytes: 0.02 10*3/uL (ref 0.00–0.07)
Basophils Absolute: 0 10*3/uL (ref 0.0–0.1)
Basophils Relative: 1 %
Eosinophils Absolute: 0.1 10*3/uL (ref 0.0–0.5)
Eosinophils Relative: 2 %
HCT: 36.2 % (ref 36.0–46.0)
Hemoglobin: 11.6 g/dL — ABNORMAL LOW (ref 12.0–15.0)
Immature Granulocytes: 0 %
Lymphocytes Relative: 40 %
Lymphs Abs: 1.9 10*3/uL (ref 0.7–4.0)
MCH: 29.1 pg (ref 26.0–34.0)
MCHC: 32 g/dL (ref 30.0–36.0)
MCV: 91 fL (ref 80.0–100.0)
Monocytes Absolute: 0.4 10*3/uL (ref 0.1–1.0)
Monocytes Relative: 9 %
Neutro Abs: 2.3 10*3/uL (ref 1.7–7.7)
Neutrophils Relative %: 48 %
Platelets: 203 10*3/uL (ref 150–400)
RBC: 3.98 MIL/uL (ref 3.87–5.11)
RDW: 13.2 % (ref 11.5–15.5)
WBC: 4.8 10*3/uL (ref 4.0–10.5)
nRBC: 0 % (ref 0.0–0.2)

## 2022-05-17 LAB — RESP PANEL BY RT-PCR (FLU A&B, COVID) ARPGX2
Influenza A by PCR: NEGATIVE
Influenza B by PCR: NEGATIVE
SARS Coronavirus 2 by RT PCR: NEGATIVE

## 2022-05-17 LAB — ETHANOL: Alcohol, Ethyl (B): <10 mg/dL (ref <10)

## 2022-05-17 LAB — TSH: TSH: 28.253 u[IU]/mL — ABNORMAL HIGH (ref 0.350–4.500)

## 2022-05-17 LAB — ACETAMINOPHEN LEVEL: Acetaminophen (Tylenol), Serum: <10 ug/mL — ABNORMAL LOW (ref 10–30)

## 2022-05-17 LAB — SALICYLATE LEVEL: Salicylate Lvl: <7 mg/dL — ABNORMAL LOW (ref 7.0–30.0)

## 2022-05-17 MED ORDER — MAGNESIUM HYDROXIDE 400 MG/5ML PO SUSP: 30.0000 mL | Freq: Every day | ORAL | Status: DC | PRN

## 2022-05-17 MED ORDER — LAMOTRIGINE 25 MG PO TABS
25.0000 mg | ORAL_TABLET | Freq: Two times a day (BID) | ORAL | Status: DC
  Administered 2022-05-18 – 2022-05-19 (×3): 25 mg via ORAL
  Filled 2022-05-17 (×8): qty 1

## 2022-05-17 MED ORDER — WHITE PETROLATUM EX OINT
TOPICAL_OINTMENT | CUTANEOUS | Status: DC | PRN
  Filled 2022-05-17: qty 28.35

## 2022-05-17 MED ORDER — ACETAMINOPHEN 500 MG PO TABS: 1000.0000 mg | ORAL_TABLET | Freq: Four times a day (QID) | ORAL | Status: DC | PRN

## 2022-05-17 MED ORDER — ZIPRASIDONE MESYLATE 20 MG IM SOLR
20.0000 mg | Freq: Once | INTRAMUSCULAR | Status: AC
  Administered 2022-05-17: 20 mg via INTRAMUSCULAR
  Filled 2022-05-17: qty 20

## 2022-05-17 MED ORDER — LEVOTHYROXINE SODIUM 200 MCG PO TABS
200.0000 ug | ORAL_TABLET | Freq: Every day | ORAL | Status: DC
  Administered 2022-05-18 – 2022-05-19 (×2): 200 ug via ORAL
  Filled 2022-05-17 (×4): qty 1

## 2022-05-17 MED ORDER — OLANZAPINE 5 MG PO TABS
5.0000 mg | ORAL_TABLET | Freq: Every day | ORAL | Status: DC
  Administered 2022-05-17: 5 mg via ORAL
  Filled 2022-05-17 (×4): qty 1

## 2022-05-17 MED ORDER — ALBUTEROL SULFATE HFA 108 (90 BASE) MCG/ACT IN AERS: 2.0000 | INHALATION_SPRAY | RESPIRATORY_TRACT | Status: DC | PRN

## 2022-05-17 MED ORDER — OLANZAPINE 5 MG PO TABS
5.0000 mg | ORAL_TABLET | Freq: Every day | ORAL | Status: DC
  Filled 2022-05-17: qty 1

## 2022-05-17 MED ORDER — LEVOTHYROXINE SODIUM 100 MCG PO TABS
200.0000 ug | ORAL_TABLET | Freq: Every day | ORAL | Status: DC
  Administered 2022-05-17: 200 ug via ORAL
  Filled 2022-05-17: qty 2

## 2022-05-17 MED ORDER — HYDROXYZINE HCL 50 MG PO TABS: 50.0000 mg | ORAL_TABLET | Freq: Three times a day (TID) | ORAL | Status: DC | PRN

## 2022-05-17 MED ORDER — LAMOTRIGINE 25 MG PO TABS
25.0000 mg | ORAL_TABLET | Freq: Two times a day (BID) | ORAL | Status: DC
  Administered 2022-05-17: 25 mg via ORAL
  Filled 2022-05-17: qty 1

## 2022-05-17 MED ORDER — LORAZEPAM 1 MG PO TABS
1.0000 mg | ORAL_TABLET | Freq: Once | ORAL | Status: AC
  Administered 2022-05-17: 1 mg via ORAL
  Filled 2022-05-17: qty 1

## 2022-05-17 MED ORDER — LAMOTRIGINE 25 MG PO TABS
25.0000 mg | ORAL_TABLET | Freq: Two times a day (BID) | ORAL | Status: DC
  Administered 2022-05-17: 25 mg via ORAL
  Filled 2022-05-17 (×3): qty 1

## 2022-05-17 MED ORDER — ALUM & MAG HYDROXIDE-SIMETH 200-200-20 MG/5ML PO SUSP: 30.0000 mL | ORAL | Status: DC | PRN

## 2022-05-17 NOTE — ED Notes (Signed)
Patient on the phone at this time using her phone call privileges.

## 2022-05-17 NOTE — ED Notes (Signed)
Pt remains in clothes from home. Changed into purple scrubs. Allowed one phone call.  [facility] paperwork faxed. Report to be called at [facility] with notify when report can be called.

## 2022-05-17 NOTE — ED Notes (Signed)
Pt allowed staff to obtain EKG. Continues to be resistant to blood work. Will continue to work with pt to obtain sample.

## 2022-05-17 NOTE — ED Notes (Signed)
Paperwork ready when pt can go to [facility] awaiting AC at [facility] response at or after 7pm Pass off report will be given to jasmine NS
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
## 2022-05-17 NOTE — ED Notes (Signed)
Patient pleasant at this time with staff and is waiting on MD to come see her.

## 2022-05-17 NOTE — ED Notes (Signed)
Patient stated that she refuses any shots or bloodwork. Patient advised that she is IVC and cannot refuse. This paramedic and several nurses and security approached patient. She was offered again to let me obtain bloodwork via [facility] blood draw. Patient stated she did not consent and that she did not want to give her blood. Patient was then explained the process of giving Geodon. Patient initially withdrew her arms but eventually with little assistance held her arm for this paramedic to administer Geodon to the right deltoid.

## 2022-05-17 NOTE — Progress Notes (Signed)
Pt was accepted to [facility]; Bed Assignment 403-1 Please send  IVC to be faxed to [phone].   Pt meets inpatient criteria per Bethann Punches, NP,   Attending Physician will be Dr. Caswell Corwin  Report can be called to: Adult unit: [phone]  Pt can arrive after: Night Shift [place] Team notified: [facility] Lynnda Shields, RN, Bethann Punches, NP, Morrie Sheldon, EMT-P   Nadara Mode, [place] 05/17/2022 @ 2:02 PM

## 2022-05-17 NOTE — ED Notes (Signed)
Patient not dressed out by previous nurse. Patient wearing only ankle length gown.

## 2022-05-17 NOTE — ED Notes (Signed)
IVC PAPERS ARE COMPLETED

## 2022-05-17 NOTE — ED Notes (Signed)
RN and LCSW attempted TTS assessment with patient. Pt reported "I'm not comfortable with cameras" when process was explained to patient. RN and LCSW attempted to instead try and phone call conversation. Pt declined to verbally answer questions. Plan is now for psychiatric PA to assess patient in person in the am. MD and PA notified.

## 2022-05-17 NOTE — ED Notes (Signed)
Staffing called. No sitters available at this time.

## 2022-05-17 NOTE — ED Notes (Signed)
Refused blood collection

## 2022-05-17 NOTE — ED Notes (Signed)
Patient wants to know what her blood work says. MD was made aware.

## 2022-05-17 NOTE — ED Notes (Signed)
Pt continues to refuse blood work. Pt made clearly aware that blood work is needed for further treatment and evaluation and that staff has attempted to work with her both in time waited and in lengthy discussions on why we need to obtain it. Pt continues to refuse. Pt notified that Geodon order was placed by PA to be administered and then blood be obtained if she continues to refuse. Pt states that she will not willingly give blood at this time. Security called for assistance of medication administration and blood draw.

## 2022-05-17 NOTE — ED Notes (Signed)
DC to [facility] with [employer]

## 2022-05-18 ENCOUNTER — Encounter (HOSPITAL_COMMUNITY): Payer: Self-pay | Admitting: Nurse Practitioner

## 2022-05-18 ENCOUNTER — Other Ambulatory Visit: Payer: Self-pay

## 2022-05-18 DIAGNOSIS — F319 Bipolar disorder, unspecified: Secondary | ICD-10-CM | POA: Diagnosis not present

## 2022-05-18 MED ORDER — ONDANSETRON HCL 4 MG PO TABS: 4.0000 mg | ORAL_TABLET | Freq: Three times a day (TID) | ORAL | Status: DC | PRN

## 2022-05-18 MED ORDER — OLANZAPINE 10 MG IM SOLR: 5.0000 mg | Freq: Two times a day (BID) | INTRAMUSCULAR | Status: DC | PRN

## 2022-05-18 MED ORDER — OLANZAPINE 5 MG PO TABS: 5.0000 mg | ORAL_TABLET | Freq: Two times a day (BID) | ORAL | Status: DC | PRN

## 2022-05-18 MED ORDER — ACETAMINOPHEN 325 MG PO TABS: 650.0000 mg | ORAL_TABLET | Freq: Four times a day (QID) | ORAL | Status: DC | PRN

## 2022-05-18 NOTE — Tx Team (Signed)
Initial Treatment Plan 05/18/2022 6:11 AM [name] IDH:[id number]    PATIENT STRESSORS: Financial difficulties   Medication change or noncompliance     PATIENT STRENGTHS: Ability for insight  Average or above average intelligence  Communication skills  Motivation for treatment/growth  Supportive family/friends    PATIENT IDENTIFIED PROBLEMS: Depression   Anxiety                   DISCHARGE CRITERIA:  Improved stabilization in mood, thinking, and/or behavior Need for constant or close observation no longer present Reduction of life-threatening or endangering symptoms to within safe limits Verbal commitment to aftercare and medication compliance  PRELIMINARY DISCHARGE PLAN: Return to previous living arrangement Return to previous work or school arrangements  PATIENT/FAMILY INVOLVEMENT: This treatment plan has been presented to and reviewed with the patient, Colleen Peck.  The patient has been given the opportunity to ask questions and make suggestions.  Einar Pheasant, RN 05/18/2022, 6:11 AM

## 2022-05-18 NOTE — Progress Notes (Signed)
   05/18/22 0500  Psych Admission Type (Psych Patients Only)  Admission Status Involuntary  Psychosocial Assessment  Patient Complaints Anxiety  Eye Contact Fair  Facial Expression Animated  Affect Anxious;Depressed  Speech Logical/coherent  Interaction Assertive  Motor Activity Other (Comment) (WDL)  Appearance/Hygiene In scrubs  Behavior Characteristics Cooperative;Appropriate to situation  Mood Anxious  Aggressive Behavior  Targets Family  Type of Behavior Verbal  Effect No apparent injury  Thought Process  Coherency WDL  Content WDL  Delusions None reported or observed  Perception WDL  Hallucination None reported or observed  Judgment Impaired  Confusion None  Danger to Self  Current suicidal ideation? Denies  Danger to Others  Danger to Others None reported or observed

## 2022-05-18 NOTE — H&P (Addendum)
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 identify and develop effective coping behaviors will improve, Compliance with prescribed medications will improve, and Ability to identify triggers associated with substance abuse/mental health issues will improve   Safety and Monitoring: Voluntary  admission to inpatient psychiatric unit for safety, stabilization and treatment Daily contact with patient to assess and evaluate symptoms and progress in treatment Patient's case to be discussed in multi-disciplinary team meeting Observation Level : q15 minute checks Vital signs: q12 hours Precautions: suicide, elopement, and assault  2. Psychiatric  Diagnoses and Treatment # Boarderline Personality Disorder # Major depressive disorder, mild, recurrent  # GAD  # Intermittent Explosive Disorder - Continue Lamictal 25 mg BID for mood disorder (for MDD and second line treatmetn for intermittent explosive d/O0.  - D/c Zyprexa '5mg'$     PRN:   -- Tylenol for pain  --Maalox for indigestion  -- Atarax 50 mg for anxiety  -- Magnesium hydroxide for constipation  -- The risks/benefits/side-effects/alternatives to this medication were discussed in detail with the patient and time was given for questions. The patient consents to medication trial.              -- Metabolic profile and EKG monitoring obtained while on an atypical antipsychotic  BMI: 34.7 TSH: 28.25 QTc: 414             -- Encouraged patient to participate in unit milieu and in scheduled group therapies     3. Medical Issues Being Addressed: #Hypothyroidism - Continue Levothyroxine '200mg'$   4. Discharge Planning:              -- Social work and case management to assist with discharge planning and identification of hospital follow-up needs prior to discharge             -- Estimated LOS: 3-5 days             --  Discharge Concerns: Need to establish a safety plan; Medication compliance and effectiveness             -- Discharge Goals: Return home with outpatient referrals for mental health follow-up including medication management/psychotherapy    Renard Matter, Medical Student 11/7/202310:51 AM    Total Time Spent in Direct Patient Care:  I personally spent 60 minutes on the unit in direct patient care. The direct patient care time included face-to-face time with the patient, reviewing the patient's chart, communicating with other professionals, and coordinating care. Greater than 50% of this time was spent in counseling or coordinating care with the patient regarding goals of hospitalization, psycho-education, and discharge planning needs.  I personally was present and  performed or re-performed the history, physical exam and medical decision-making activities of this service and have verified that the service and findings are accurately documented in the student's note, , as addended by me or notated below:  I will rescind IVC and have pt to sign in as voluntary.   Pt is willing to restart lamotrigine. Lamictal: We will prescribe lamotrigine to manage depressive disorder and IE d/o.Patient is aware of the need to take this medication every day, and if the patient forgets more than 3 doses, the patient is to call THEIR OUTPATIENT PSYCHIATIRST immediately and to not resume the medication. We discussed the risks, benefits, side effects, and alternatives to lamotrigine including but not limited to, the risk of fatigue, sedation, weight gain, sleep changes, myalgias, headaches, potential for toxicity to liver, rash including Kathreen Cosier rash, potential for aseptic meningitis, potential for medication interactions, and to not take these medications with alcohol or illicit drugs. Informed consent was obtained.  Pt does not appear to be in a mood episode at this time (neither depressed, hypomanic). Paranoia could be justified and 2/2 anxiety, trauma, hypervigilance, or borderline PD. Will monitor if pt has any more explosive outbursts or prominent mood symptoms that require inpatient treatment and level of care.   Pt did not let me call mother. Did give me permission to call father but does not have his phone number. Without this collateral information, I think the diagnosis is primarily intermittent explosive disorder versus borderline PD (had borderline crisis?). Also has convincing h/o MDD (denies h/o hypomania or mania not 2/2 thyroid crisis), and has anxiety disorder.   Janine Limbo, MD Psychiatrist

## 2022-05-18 NOTE — Progress Notes (Signed)
Adult Psychoeducational Group Note  Date:  05/18/2022 Time:  1:29 PM  Pt did not attend orientation group.

## 2022-05-18 NOTE — Progress Notes (Signed)
Pt in bed most of the morning. Pt did attend lunch. Pt presents with irritable affect. Pt denies SI/HI as well as a/v hallucinations. Pt was resistant to taking lamictal this morning stating she took dose last night; pt educated medication is prescribed twice daily currently. Pt apprehensive but compliant with medications. Q 15 minute checks ongoing for safety.

## 2022-05-18 NOTE — Progress Notes (Signed)
Pt refusing to sign for voluntary treatment when nurse presented document. Provider notified.
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
## 2022-05-19 ENCOUNTER — Encounter (HOSPITAL_COMMUNITY): Payer: Self-pay | Admitting: Nurse Practitioner

## 2022-05-19 ENCOUNTER — Encounter (HOSPITAL_COMMUNITY): Payer: Self-pay

## 2022-05-19 DIAGNOSIS — R4689 Other symptoms and signs involving appearance and behavior: Principal | ICD-10-CM | POA: Insufficient documentation

## 2022-05-19 DIAGNOSIS — F319 Bipolar disorder, unspecified: Secondary | ICD-10-CM | POA: Diagnosis not present

## 2022-05-19 DIAGNOSIS — F411 Generalized anxiety disorder: Secondary | ICD-10-CM | POA: Insufficient documentation

## 2022-05-19 DIAGNOSIS — F6381 Intermittent explosive disorder: Secondary | ICD-10-CM | POA: Insufficient documentation

## 2022-05-19 MED ORDER — LAMOTRIGINE 25 MG PO TABS: 25.0000 mg | ORAL_TABLET | Freq: Two times a day (BID) | ORAL | 0 refills | Status: DC

## 2022-05-19 NOTE — BHH Suicide Risk Assessment (Signed)
[place] [place] INPATIENT:  Family/Significant Other Suicide Prevention Education  Suicide Prevention Education:  Patient Refusal for Family/Significant Other Suicide Prevention Education: The patient Colleen Peck has refused to provide written consent for family/significant other to be provided Family/Significant Other Suicide Prevention Education during admission and/or prior to discharge.  Physician notified.  Durenda Hurt 05/19/2022, 1:28 PM

## 2022-05-19 NOTE — Progress Notes (Addendum)
Patient is discharging at this time. Patient is alert and stable. Patient denies SI,HI, and A/V/H with no plan/intent. Printed AVS reviewed with and given to patient along with medications, shelter/emergency housing resources, and follow up appointments. Patient verbalized understanding. All valuables/belongings returned to patient. Bus pass and cab services/passes offered to patient but patient refused and contacted her own transportation. Patient denies any pain/discomfort. No s/s of current distress.

## 2022-05-19 NOTE — Group Note (Signed)
Recreation Therapy Group Note   Group Topic:Team Building  Group Date: 05/19/2022 Start Time: 0930 End Time: 0955 Facilitators: Teniyah Seivert-McCall, LRT,CTRS Location: [address] Day[address]   Goal Area(s) Addresses:  Patient will effectively work with peer towards shared goal.  Patient will identify skills used to make activity successful.  Patient will identify how skills used during activity can be used to reach post d/c goals.   Group Description: Landing Pad. In teams of 3-5, patients were given 12 plastic drinking straws and an equal length of masking tape. Using the materials provided, patients were asked to build a landing pad to catch a golf ball dropped from approximately 5 feet in the air. All materials were required to be used by the team in their design. LRT facilitated post-activity discussion.   Affect/Mood: N/A   Participation Level: Did not attend    Clinical Observations/Individualized Feedback:     Plan: Continue to engage patient in RT group sessions 2-3x/week.   Ashlyne Olenick-McCall, LRT,CTRS 05/19/2022 11:16 AM

## 2022-05-19 NOTE — Group Note (Signed)
LCSW Group Therapy Note  Group Date: 05/19/2022 Start Time: 1300 End Time: 1400   Type of Therapy and Topic:  Group Therapy - Healthy vs Unhealthy Coping Skills  Participation Level:  Did Not Attend   Description of Group The focus of this group was to determine what unhealthy coping techniques typically are used by group members and what healthy coping techniques would be helpful in coping with various problems. Patients were guided in becoming aware of the differences between healthy and unhealthy coping techniques. Patients were asked to identify 2-3 healthy coping skills they would like to learn to use more effectively.  Therapeutic Goals Patients learned that coping is what human beings do all day long to deal with various situations in their lives Patients defined and discussed healthy vs unhealthy coping techniques Patients identified their preferred coping techniques and identified whether these were healthy or unhealthy Patients determined 2-3 healthy coping skills they would like to become more familiar with and use more often. Patients provided support and ideas to each other   Summary of Patient Progress:  Did not attend    Therapeutic Modalities Cognitive Behavioral Therapy Motivational Interviewing  Darleen Crocker, [place] 05/19/2022  2:08 PM
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
## 2022-05-19 NOTE — BHH Counselor (Addendum)
CSW was advised by the provider that the Pt was willing to sign an ROI for the doctor to speak with her outpatient Psychiatrist.  CSW went to speak with the Pt in her room.  CSW explained that the doctor had informed her that the Pt wanted to sign the form for staff to speak with her Psychiatrist.  The Pt stated asked who her doctor was.  CSW explained that the medical student that spoke with her today and yesterday was the one that informed the CSW.  The Pt stated that the medical student is not her doctor and that she "did not tell the medical student or the doctor that I wanted to sign anything".  She stated that she had not spoken to either the doctor or the medical student today or yesterday.  CSW then described the doctor and medical student to her and the Pt stated "oh yes I have seen them today but I did not agree to sign anything".  The Pt then asked if CSW was the same CSW that did an assessment with her yesterday on 05/18/2022.  CSW explained that she was in fact the same CSW.  The Pt then stated "you look different.  Are you sure you are the same person".  CSW stated that her hair was not in a ponytail yesterday but that she is the same CSW.  The Pt then rolled her eyes and stated "you shouldn't do that". The Pt then asked why the form would be needed in order to speak with the outpatient provider.  CSW explained that the form was needed so that information about the Pt's diagnosis and medications can be disclosed by the outpatient provider to the hospital and so the hospital can send the outpatient provider the information from the hospital.  The Pt then inquired about how the ROI gets to medical records.  CSW informed the Pt that it is faxed over to their internal system and that the only people that see it is the medical records team and that then they send the information to the outpatient provider.  CSW explained that she was not aware of how information was sent from medical records to the  outpatient provider.  The Pt asked to look at the form again (Pt was shown and allowed to read form during assessment with CSW on 05/18/2022).  After reading the form the Pt stated that line 7 on the form states that " fax and email are not secure methods of sending information".  CSW explained again that she is not sure how records are sent to the outpatient providers.  The Pt stated that even within the [facility] system this was "a breach of my privacy and I will not sign anything".  The Pt stated that she has had poor experiences with [facility] in the past and that "I want all of my diagnoses taken off my record because this hospital tells lies about me and does not respect my privacy".  CSW attempted to explain how privacy is kept within the hospital setting and that no one but the care team will be viewing her documents.  The Pt started pointing out multiple ways her privacy has been violated including 15 minute checks and having a roommate.  The Pt asked if she could keep the ROI and look over it further for the rest of the day.  CSW explained that she could not leave the form due to the Pt personal information being written on that form (the  sticker that is placed on all ROI's to indicate which patient it is for).  The Pt stated that if she could not have the form then she would not sign it.  She asked if the form could be signed at discharge.  CSW explained that "yes it can but we will be unable to get information from your provider to assist with your current care since you will then no longer be a patient here at discharge".  CSW explained to the Pt that she does not have to sign an ROI at all and that she can send medical records to her outpatient doctor by her own request after discharge by requesting these records for herself from [facility] records.  The Pt stated "but if I do not sign it then you all will say I am paranoid and being difficult".  CSW stated that the Pt would not be accused of being difficult  and that she does not have to sign any ROI.  The Pt continued to repeat herself and point out how her privacy is not being upheld and that she is being seen as "being difficult".  CSW stated that she would inform the provider that she does not wish to sign the form.  The Pt requested that she be asked again to sign the form the next day on 05/20/2022.  CSW agreed and then informed the providers of the Pt's requests, behaviors, and what was said during the meeting.    The Pt also was allowed to have her voluntary form kept in her room.  The Pt showed the form to the CSW and asked if CSW had brought it to her.  CSW explained that she had not and did not have anything to do with this form.  The Pt stated that she would not sign in voluntary either.  CSW informed the providers of this information as well.    The Pt was discharged while CSW was at lunch.  Another CSW was asked to discharge the Pt during this time.  This CSW will ask the discharging CSW to put a note in the chart to explain the Pt's behaviors at discharge.  The Pt was given a resource packet by the discharging CSW and 2 bus tickets.  After discharge the nurse came to [place] and stated that the Pt had given her the resource packet and bus tickets and stated that she did not need them.  CSW placed the information back in the desk drawer since it did not contain any personal information of the Pt's.
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
## 2022-05-19 NOTE — BH IP Treatment Plan (Signed)
Interdisciplinary Treatment and Diagnostic Plan Update  05/19/2022 Time of Session: Colleen Peck MRN: [id number]  Principal Diagnosis: Aggressive behavior  Secondary Diagnoses: Principal Problem:   Aggressive behavior Active Problems:   PTSD (post-traumatic stress disorder)   GAD (generalized anxiety disorder)   Current Medications:  Current Facility-Administered Medications  Medication Dose Route Frequency Provider Last Rate Last Admin   acetaminophen (TYLENOL) tablet 650 mg  650 mg Oral Q6H PRN Massengill, Ovid Curd, MD       alum & mag hydroxide-simeth (MAALOX/MYLANTA) 200-200-20 MG/5ML suspension 30 mL  30 mL Oral Q4H PRN Vesta Mixer, NP       hydrOXYzine (ATARAX) tablet 50 mg  50 mg Oral TID PRN Vesta Mixer, NP       lamoTRIgine (LAMICTAL) tablet 25 mg  25 mg Oral BID Onuoha, Chinwendu V, NP   25 mg at 05/19/22 0900   levothyroxine (SYNTHROID) tablet 200 mcg  200 mcg Oral Q0600 Vesta Mixer, NP   200 mcg at 05/19/22 9509   magnesium hydroxide (MILK OF MAGNESIA) suspension 30 mL  30 mL Oral Daily PRN Vesta Mixer, NP       OLANZapine (ZYPREXA) injection 5 mg  5 mg Intramuscular BID PRN Massengill, Ovid Curd, MD       OLANZapine (ZYPREXA) tablet 5 mg  5 mg Oral BID PRN Massengill, Ovid Curd, MD       ondansetron (ZOFRAN) tablet 4 mg  4 mg Oral Q8H PRN Massengill, Ovid Curd, MD       Current Outpatient Medications  Medication Sig Dispense Refill   albuterol (VENTOLIN HFA) 108 (90 Base) MCG/ACT inhaler INHALE 2 PUFFS INTO THE LUNGS EVERY 4 HOURS AS NEEDED FOR WHEEZING OR SHORTNESS OF BREATH (Patient not taking: Reported on 05/17/2022) 18 g 1   lamoTRIgine (LAMICTAL) 25 MG tablet Take 1 tablet (25 mg total) by mouth 2 (two) times daily for 7 days. 14 tablet 0   levothyroxine (SYNTHROID) 200 MCG tablet Take 1 tablet (200 mcg total) by mouth daily at 6 (six) AM. 30 tablet 2   montelukast (SINGULAIR) 10 MG tablet Take 10 mg by mouth daily as needed (allergies).     PTA  Medications: No medications prior to admission.    Patient Stressors: Financial difficulties   Medication change or noncompliance    Patient Strengths: Ability for insight  Average or above average Media planner for treatment/growth  Supportive family/friends   Treatment Modalities: Medication Management, Group therapy, Case management,  1 to 1 session with clinician, Psychoeducation, Recreational therapy.   Physician Treatment Plan for Primary Diagnosis: Aggressive behavior Long Term Goal(s): Improvement in symptoms so as ready for discharge   Short Term Goals: Ability to identify and develop effective coping behaviors will improve Compliance with prescribed medications will improve Ability to identify triggers associated with substance abuse/mental health issues will improve  Medication Management: Evaluate patient's response, side effects, and tolerance of medication regimen.  Therapeutic Interventions: 1 to 1 sessions, Unit Group sessions and Medication administration.  Evaluation of Outcomes: Adequate for Discharge  Physician Treatment Plan for Secondary Diagnosis: Principal Problem:   Aggressive behavior Active Problems:   PTSD (post-traumatic stress disorder)   GAD (generalized anxiety disorder)  Long Term Goal(s): Improvement in symptoms so as ready for discharge   Short Term Goals: Ability to identify and develop effective coping behaviors will improve Compliance with prescribed medications will improve Ability to identify triggers associated with substance abuse/mental health issues will improve     Medication Management: Evaluate  patient's response, side effects, and tolerance of medication regimen.  Therapeutic Interventions: 1 to 1 sessions, Unit Group sessions and Medication administration.  Evaluation of Outcomes: Adequate for Discharge   RN Treatment Plan for Primary Diagnosis: Aggressive behavior Long Term Goal(s):  Knowledge of disease and therapeutic regimen to maintain health will improve  Short Term Goals: Ability to remain free from injury will improve, Ability to verbalize frustration and anger appropriately will improve, Ability to demonstrate self-control, Ability to participate in decision making will improve, Ability to verbalize feelings will improve, Ability to disclose and discuss suicidal ideas, Ability to identify and develop effective coping behaviors will improve, and Compliance with prescribed medications will improve  Medication Management: RN will administer medications as ordered by provider, will assess and evaluate patient's response and provide education to patient for prescribed medication. RN will report any adverse and/or side effects to prescribing provider.  Therapeutic Interventions: 1 on 1 counseling sessions, Psychoeducation, Medication administration, Evaluate responses to treatment, Monitor vital signs and CBGs as ordered, Perform/monitor CIWA, COWS, AIMS and Fall Risk screenings as ordered, Perform wound care treatments as ordered.  Evaluation of Outcomes: Progressing   LCSW Treatment Plan for Primary Diagnosis: Aggressive behavior Long Term Goal(s): Safe transition to appropriate next level of care at discharge, Engage patient in therapeutic group addressing interpersonal concerns.  Short Term Goals: Engage patient in aftercare planning with referrals and resources, Increase social support, Increase ability to appropriately verbalize feelings, Increase emotional regulation, Facilitate acceptance of mental health diagnosis and concerns, Facilitate patient progression through stages of change regarding substance use diagnoses and concerns, Identify triggers associated with mental health/substance abuse issues, and Increase skills for wellness and recovery  Therapeutic Interventions: Assess for all discharge needs, 1 to 1 time with Social worker, Explore available resources and  support systems, Assess for adequacy in community support network, Educate family and significant other(s) on suicide prevention, Complete Psychosocial Assessment, Interpersonal group therapy.  Evaluation of Outcomes: Progressing   Progress in Treatment: Attending groups: No. Participating in groups: No. Taking medication as prescribed: No. Toleration medication: No. Family/Significant other contact made: No, will contact:  Patient declined consent Patient understands diagnosis: Yes. Discussing patient identified problems/goals with staff: Yes. Medical problems stabilized or resolved: Yes. Denies suicidal/homicidal ideation: Yes. Issues/concerns per patient self-inventory: Yes. Other: none  New problem(s) identified: No, Describe:  none  New Short Term/Long Term Goal(s): Patient to continue working towards treatment goals after discharge. Patient no longer meets criteria for inpatient criteria per attending physician. Continue taking medications as prescribed, nursing to provide instructions at discharge. Follow up with all scheduled appointments.   Patient Goals:  Patient states their goal for treatment is to "discharge and remove diagnosis from my chart."  Discharge Plan or Barriers: No psychosocial barriers identified at this time, patient to return to place of residence when appropriate for discharge.   Reason for Continuation of Hospitalization: NA, patient scheduled to discharge on this day    Scribe for Treatment Team: Larose Kells 05/19/2022 4:21 PM

## 2022-05-19 NOTE — Progress Notes (Signed)
Patient resting quietly in bed with eyes closed, Respirations equal and unlabored, skin warm and dry, NAD. Routine safety checks conducted according to facility protocol. Will continue to monitor for safety. 

## 2022-05-19 NOTE — Progress Notes (Addendum)
  [facility] Adult Case Management Discharge Plan :  Will you be returning to the same living situation after discharge:  No. Patient refuses to return to place of residence prior to admission. However, patient will not provide any information to writer as to what her plans are or where she plans on going after discharge. Patient was provided list of housing resources in the region in case patient becomes homeless. Ultimately, patient is to be discharged to lobby.   At discharge, do you have transportation home?: Yes,  Patient states she will arrange her own transportation after discharge. Will not provide any details to Probation officer. Patient has been informed that transportation can be provided within a certain distance. However, patient continues to decline. Provided bus pass in discharge instructions.  Do you have the ability to pay for your medications: Yes,  [employer] private insurance   Release of information consent forms completed and in the chart;  Patient's signature needed at discharge.  Patient to Follow up at:  Follow-up Information     Hart Rochester, MD. Schedule an appointment as soon as possible for a visit.   Why: Please follow up with your outpatient psychiatrist to schedule hospital followup appointment. Contact information: [place], [place] [address] [phone]               Patient has declined consent on multiple attempts to make referrals or appointments with outpatient providers.  Next level of care provider has access to [place] and Suicide Prevention discussed: Yes,  SPE completed with patient, has declined consent to reach collateral.    Has patient been referred to the Quitline?: N/A patient is not a smoker Tobacco Use: Low Risk  (05/18/2022)   Patient History    Smoking Tobacco Use: Never    Smokeless Tobacco Use: Never    Passive Exposure: Not on file   Patient has been referred for addiction treatment: N/A Social  History   Substance and Sexual Activity  Alcohol Use Yes   Comment: occ   Social History   Substance and Sexual Activity  Drug Use Not Currently   Comment: past use of marijuana in '08-'09. occasional eats brownies w/ marijuana     Durenda Hurt, LCSWA 05/19/2022, 1:26 PM

## 2022-05-20 NOTE — Progress Notes (Signed)
Order was not placed by me as it was done during hospital admission.

## 2022-05-21 NOTE — Progress Notes (Signed)
Ordered per altered mental status protocol

## 2023-09-07 ENCOUNTER — Emergency Department (HOSPITAL_COMMUNITY)
Admission: EM | Admit: 2023-09-07 | Discharge: 2023-09-08 | Discharge status: Against Medical Advice | Payer: Medicare Other | Attending: Emergency Medicine | Admitting: Emergency Medicine

## 2023-09-07 ENCOUNTER — Encounter (HOSPITAL_COMMUNITY): Payer: Self-pay

## 2023-09-07 ENCOUNTER — Other Ambulatory Visit: Payer: Self-pay

## 2023-09-07 DIAGNOSIS — J45909 Unspecified asthma, uncomplicated: Secondary | ICD-10-CM | POA: Diagnosis not present

## 2023-09-07 DIAGNOSIS — R443 Hallucinations, unspecified: Secondary | ICD-10-CM | POA: Diagnosis present

## 2023-09-07 DIAGNOSIS — F29 Unspecified psychosis not due to a substance or known physiological condition: Secondary | ICD-10-CM | POA: Insufficient documentation

## 2023-09-07 DIAGNOSIS — Z91040 Latex allergy status: Secondary | ICD-10-CM | POA: Insufficient documentation

## 2023-09-07 LAB — COMPREHENSIVE METABOLIC PANEL
ALT: 57 U/L — ABNORMAL HIGH (ref 0–44)
AST: 49 U/L — ABNORMAL HIGH (ref 15–41)
Albumin: 4.5 g/dL (ref 3.5–5.0)
Alkaline Phosphatase: 42 U/L (ref 38–126)
Anion gap: 8 (ref 5–15)
BUN: 7 mg/dL (ref 6–20)
CO2: 27 mmol/L (ref 22–32)
Calcium: 9.7 mg/dL (ref 8.9–10.3)
Chloride: 103 mmol/L (ref 98–111)
Creatinine, Ser: 0.97 mg/dL (ref 0.44–1.00)
GFR, Estimated: >60 mL/min (ref >60)
Glucose, Bld: 114 mg/dL — ABNORMAL HIGH (ref 70–99)
Potassium: 3.5 mmol/L (ref 3.5–5.1)
Sodium: 138 mmol/L (ref 135–145)
Total Bilirubin: 0.7 mg/dL (ref 0.0–1.2)
Total Protein: 7.7 g/dL (ref 6.5–8.1)

## 2023-09-07 LAB — CBC WITH DIFFERENTIAL/PLATELET
Abs Immature Granulocytes: 0.01 10*3/uL (ref 0.00–0.07)
Basophils Absolute: 0 10*3/uL (ref 0.0–0.1)
Basophils Relative: 1 %
Eosinophils Absolute: 0.1 10*3/uL (ref 0.0–0.5)
Eosinophils Relative: 3 %
HCT: 29.8 % — ABNORMAL LOW (ref 36.0–46.0)
Hemoglobin: 9.1 g/dL — ABNORMAL LOW (ref 12.0–15.0)
Immature Granulocytes: 0 %
Lymphocytes Relative: 48 %
Lymphs Abs: 2.3 10*3/uL (ref 0.7–4.0)
MCH: 27.8 pg (ref 26.0–34.0)
MCHC: 30.5 g/dL (ref 30.0–36.0)
MCV: 91.1 fL (ref 80.0–100.0)
Monocytes Absolute: 0.2 10*3/uL (ref 0.1–1.0)
Monocytes Relative: 5 %
Neutro Abs: 2 10*3/uL (ref 1.7–7.7)
Neutrophils Relative %: 43 %
Platelets: 224 10*3/uL (ref 150–400)
RBC: 3.27 MIL/uL — ABNORMAL LOW (ref 3.87–5.11)
RDW: 15 % (ref 11.5–15.5)
WBC: 4.8 10*3/uL (ref 4.0–10.5)
nRBC: 0 % (ref 0.0–0.2)

## 2023-09-07 LAB — SALICYLATE LEVEL: Salicylate Lvl: <7 mg/dL — ABNORMAL LOW (ref 7.0–30.0)

## 2023-09-07 LAB — ETHANOL: Alcohol, Ethyl (B): <10 mg/dL (ref <10)

## 2023-09-07 LAB — ACETAMINOPHEN LEVEL: Acetaminophen (Tylenol), Serum: <10 ug/mL — ABNORMAL LOW (ref 10–30)

## 2023-09-07 NOTE — ED Notes (Signed)
 Per charge RN, pt transferred to [facility] room 35.   Pt given paper scrubs and changed out.

## 2023-09-07 NOTE — ED Triage Notes (Signed)
 Pt is hearing voices and cussing out her family members. Pt would like a psych eval.

## 2023-09-07 NOTE — ED Notes (Signed)
 Pt belongings bagged and placed in locker. Pt has 1 belonging bag. Pt expresses frustration upon having to change out of her belongings and not being allowed to have her visitor with her. [facility] policies explained to pt, and pt acknowledged understanding. Pt calm and cooperative throughout interaction. Pt provided with warm blankets and juice.

## 2023-09-07 NOTE — ED Notes (Signed)
Save red tube in main lab 

## 2023-09-08 LAB — HCG, SERUM, QUALITATIVE: Preg, Serum: NEGATIVE

## 2023-09-08 MED ORDER — ONDANSETRON HCL 4 MG PO TABS: 4.0000 mg | ORAL_TABLET | Freq: Three times a day (TID) | ORAL | Status: DC | PRN

## 2023-09-08 NOTE — BH Assessment (Addendum)
 Paramedic Rose said that patient is unwilling to participate in assessment at this time.  Will try again later.

## 2023-09-08 NOTE — ED Notes (Signed)
 Pt stated she did not feel comfortable signing the AMA form; EMT-P signed as witness. Pt escorted to the lobby. Pt given a sheet of resources per her request. Pt mother contacted and on the way to pick pt up.

## 2023-09-08 NOTE — ED Notes (Addendum)
 Pt approached nurses desk asking to sign out AMA. Pt denied any current SI. Provider contacted on the matter. Provider states since pt was voluntary she could leave AMA. Pt belongings returned to pt.

## 2023-09-08 NOTE — ED Notes (Signed)
 Colleen Peck contacted EMT-P about TTS consult. Pt was alert and asked to participate in the consult, but pt expressed she was not willing at this time. Colleen Peck advised of such.

## 2023-09-08 NOTE — ED Provider Notes (Signed)
 [name] Provider Note   CSN: [id number] Arrival date & time: 09/07/23  2205     History  Chief Complaint  Patient presents with   Hallucinations    Colleen Peck is a 43 y.o. female.  The history is provided by the patient.  Mental Health Problem Presenting symptoms: aggressive behavior and hallucinations   Degree of incapacity (severity):  Moderate Onset quality:  Gradual Timing:  Constant Progression:  Unchanged Chronicity:  Recurrent Relieved by:  Nothing Worsened by:  Nothing Ineffective treatments:  None tried Associated symptoms: no weight change   Risk factors: hx of mental illness   Patient with Bipolar here for hallucinations and cursing out family.      Past Medical History:  Diagnosis Date   Abnormal pap    pt reports abnl pap many years ago.  Nl since then.   Allergy    seasonal   Anemia    Asthma    Bipolar disorder (HCC)    Depression    Palpitations 03/12/2008   PTSD (post-traumatic stress disorder)    Seasonal allergies    Thyroid disease 2009   Graves disease (pt reported resolved); hypothyriodism     Home Medications Prior to Admission medications   Medication Sig Start Date End Date Taking? Authorizing Provider  albuterol (VENTOLIN HFA) 108 (90 Base) MCG/ACT inhaler INHALE 2 PUFFS INTO THE LUNGS EVERY 4 HOURS AS NEEDED FOR WHEEZING OR SHORTNESS OF BREATH Patient not taking: Reported on 05/17/2022 12/03/20   Sandre Kitty, MD  lamoTRIgine (LAMICTAL) 25 MG tablet Take 1 tablet (25 mg total) by mouth 2 (two) times daily for 7 days. 05/19/22 05/26/22  Massengill, Harrold Donath, MD  levothyroxine (SYNTHROID) 200 MCG tablet Take 1 tablet (200 mcg total) by mouth daily at 6 (six) AM. 05/06/22   Rhetta Mura, MD  montelukast (SINGULAIR) 10 MG tablet Take 10 mg by mouth daily as needed (allergies). 08/28/21   [provider]      Allergies    Abilify [aripiprazole], Kiwi extract, Latex,  Pork-derived products, and Meloxicam    Review of Systems   Review of Systems  Constitutional:  Negative for fever.  HENT:  Negative for facial swelling.   Respiratory:  Negative for wheezing and stridor.   Psychiatric/Behavioral:  Positive for hallucinations.   All other systems reviewed and are negative.   Physical Exam Updated Vital Signs BP 104/67 (BP Location: Left Arm)   Pulse 65   Temp 98.3 F (36.8 C) (Oral)   Resp 17   Ht 5\' 6"  (1.676 m)   Wt 97.5 kg   SpO2 100%   BMI 34.69 kg/m  Physical Exam Vitals and nursing note reviewed.  Constitutional:      General: She is not in acute distress.    Appearance: She is well-developed.  HENT:     Head: Normocephalic and atraumatic.  Eyes:     Pupils: Pupils are equal, round, and reactive to light.  Cardiovascular:     Rate and Rhythm: Normal rate and regular rhythm.     Pulses: Normal pulses.     Heart sounds: Normal heart sounds.  Pulmonary:     Effort: Pulmonary effort is normal. No respiratory distress.     Breath sounds: Normal breath sounds.  Abdominal:     General: Bowel sounds are normal. There is no distension.     Palpations: Abdomen is soft.     Tenderness: There is no abdominal tenderness. There is no  guarding or rebound.  Musculoskeletal:        General: Normal range of motion.     Cervical back: Neck supple.  Skin:    General: Skin is dry.     Capillary Refill: Capillary refill takes less than 2 seconds.     Findings: No erythema or rash.  Neurological:     General: No focal deficit present.     Deep Tendon Reflexes: Reflexes normal.  Psychiatric:        Behavior: Behavior is aggressive.     ED Results / Procedures / Treatments   Labs (all labs ordered are listed, but only abnormal results are displayed) Results for orders placed or performed during the hospital encounter of 09/07/23  CBC with Differential   Collection Time: 09/07/23 10:16 PM  Result Value Ref Range   WBC 4.8 4.0 - 10.5 K/uL    RBC 3.27 (L) 3.87 - 5.11 MIL/uL   Hemoglobin 9.1 (L) 12.0 - 15.0 g/dL   HCT 16.1 (L) 09.6 - 04.5 %   MCV 91.1 80.0 - 100.0 fL   MCH 27.8 26.0 - 34.0 pg   MCHC 30.5 30.0 - 36.0 g/dL   RDW 40.9 81.1 - 91.4 %   Platelets 224 150 - 400 K/uL   nRBC 0.0 0.0 - 0.2 %   Neutrophils Relative % 43 %   Neutro Abs 2.0 1.7 - 7.7 K/uL   Lymphocytes Relative 48 %   Lymphs Abs 2.3 0.7 - 4.0 K/uL   Monocytes Relative 5 %   Monocytes Absolute 0.2 0.1 - 1.0 K/uL   Eosinophils Relative 3 %   Eosinophils Absolute 0.1 0.0 - 0.5 K/uL   Basophils Relative 1 %   Basophils Absolute 0.0 0.0 - 0.1 K/uL   Immature Granulocytes 0 %   Abs Immature Granulocytes 0.01 0.00 - 0.07 K/uL  Comprehensive metabolic panel   Collection Time: 09/07/23 10:16 PM  Result Value Ref Range   Sodium 138 135 - 145 mmol/L   Potassium 3.5 3.5 - 5.1 mmol/L   Chloride 103 98 - 111 mmol/L   CO2 27 22 - 32 mmol/L   Glucose, Bld 114 (H) 70 - 99 mg/dL   BUN 7 6 - 20 mg/dL   Creatinine, Ser 7.82 0.44 - 1.00 mg/dL   Calcium 9.7 8.9 - 95.6 mg/dL   Total Protein 7.7 6.5 - 8.1 g/dL   Albumin 4.5 3.5 - 5.0 g/dL   AST 49 (H) 15 - 41 U/L   ALT 57 (H) 0 - 44 U/L   Alkaline Phosphatase 42 38 - 126 U/L   Total Bilirubin 0.7 0.0 - 1.2 mg/dL   GFR, Estimated >21 >30 mL/min   Anion gap 8 5 - 15  Ethanol   Collection Time: 09/07/23 10:16 PM  Result Value Ref Range   Alcohol, Ethyl (B) <10 <10 mg/dL  Acetaminophen level   Collection Time: 09/07/23 10:16 PM  Result Value Ref Range   Acetaminophen (Tylenol), Serum <10 (L) 10 - 30 ug/mL  Salicylate level   Collection Time: 09/07/23 10:16 PM  Result Value Ref Range   Salicylate Lvl <7.0 (L) 7.0 - 30.0 mg/dL   No results found.   Radiology No results found.  Procedures Procedures    Medications Ordered in ED Medications - No data to display  ED Course/ Medical Decision Making/ A&P  Medical Decision Making Patient here for hallucinations    Amount and/or Complexity of Data Reviewed External Data Reviewed: notes.    Details: Previous notes reviewed  Labs: ordered.    Details: Negative tylenol, salicylate and ETOH levels.  White count normal 4.8, low hemoglobin 9.1, normal platelets. Normal sodium 138, normal potassium 3.5, normal creatinine     Final Clinical Impression(s) / ED Diagnoses Final diagnoses:  Hallucinations   The patient has been placed in psychiatric observation due to the need to provide a safe environment for the patient while obtaining psychiatric consultation and evaluation, as well as ongoing medical and medication management to treat the patient's condition.  The patient has not been placed under full IVC at this time.  Rx / DC Orders ED Discharge Orders     None         Accalia Rigdon, MD 09/08/23 1610

## 2023-09-26 ENCOUNTER — Emergency Department (HOSPITAL_COMMUNITY)
Admission: EM | Admit: 2023-09-26 | Discharge: 2023-09-28 | Disposition: A | Discharge status: Other Acute Inpatient Hospital | Attending: Emergency Medicine | Admitting: Emergency Medicine

## 2023-09-26 ENCOUNTER — Encounter (HOSPITAL_COMMUNITY): Payer: Self-pay

## 2023-09-26 ENCOUNTER — Other Ambulatory Visit: Payer: Self-pay

## 2023-09-26 DIAGNOSIS — R440 Auditory hallucinations: Secondary | ICD-10-CM | POA: Diagnosis not present

## 2023-09-26 DIAGNOSIS — F259 Schizoaffective disorder, unspecified: Secondary | ICD-10-CM | POA: Diagnosis present

## 2023-09-26 DIAGNOSIS — F4310 Post-traumatic stress disorder, unspecified: Secondary | ICD-10-CM | POA: Diagnosis present

## 2023-09-26 DIAGNOSIS — F603 Borderline personality disorder: Secondary | ICD-10-CM | POA: Diagnosis not present

## 2023-09-26 DIAGNOSIS — R259 Unspecified abnormal involuntary movements: Secondary | ICD-10-CM | POA: Insufficient documentation

## 2023-09-26 DIAGNOSIS — R451 Restlessness and agitation: Secondary | ICD-10-CM | POA: Diagnosis present

## 2023-09-26 DIAGNOSIS — Z91040 Latex allergy status: Secondary | ICD-10-CM | POA: Insufficient documentation

## 2023-09-26 DIAGNOSIS — F4312 Post-traumatic stress disorder, chronic: Secondary | ICD-10-CM | POA: Diagnosis present

## 2023-09-26 DIAGNOSIS — Z046 Encounter for general psychiatric examination, requested by authority: Secondary | ICD-10-CM

## 2023-09-26 LAB — COMPREHENSIVE METABOLIC PANEL WITH GFR
ALT: 58 U/L — ABNORMAL HIGH (ref 0–44)
AST: 45 U/L — ABNORMAL HIGH (ref 15–41)
Albumin: 3.9 g/dL (ref 3.5–5.0)
Alkaline Phosphatase: 40 U/L (ref 38–126)
Anion gap: 6 (ref 5–15)
BUN: 6 mg/dL (ref 6–20)
CO2: 28 mmol/L (ref 22–32)
Calcium: 8.9 mg/dL (ref 8.9–10.3)
Chloride: 101 mmol/L (ref 98–111)
Creatinine, Ser: 0.81 mg/dL (ref 0.44–1.00)
GFR, Estimated: >60 mL/min (ref >60)
Glucose, Bld: 90 mg/dL (ref 70–99)
Potassium: 3.5 mmol/L (ref 3.5–5.1)
Sodium: 135 mmol/L (ref 135–145)
Total Bilirubin: 0.5 mg/dL (ref 0.0–1.2)
Total Protein: 7.1 g/dL (ref 6.5–8.1)

## 2023-09-26 LAB — HCG, SERUM, QUALITATIVE: Preg, Serum: NEGATIVE

## 2023-09-26 LAB — CBC
HCT: 30.3 % — ABNORMAL LOW (ref 36.0–46.0)
Hemoglobin: 9.4 g/dL — ABNORMAL LOW (ref 12.0–15.0)
MCH: 28.1 pg (ref 26.0–34.0)
MCHC: 31 g/dL (ref 30.0–36.0)
MCV: 90.4 fL (ref 80.0–100.0)
Platelets: 216 K/uL (ref 150–400)
RBC: 3.35 MIL/uL — ABNORMAL LOW (ref 3.87–5.11)
RDW: 14.6 % (ref 11.5–15.5)
WBC: 5.5 K/uL (ref 4.0–10.5)
nRBC: 0 % (ref 0.0–0.2)

## 2023-09-26 LAB — SALICYLATE LEVEL: Salicylate Lvl: <7 mg/dL — ABNORMAL LOW (ref 7.0–30.0)

## 2023-09-26 LAB — ETHANOL: Alcohol, Ethyl (B): <10 mg/dL (ref <10)

## 2023-09-26 LAB — ACETAMINOPHEN LEVEL: Acetaminophen (Tylenol), Serum: <10 ug/mL — ABNORMAL LOW (ref 10–30)

## 2023-09-26 MED ORDER — ZIPRASIDONE MESYLATE 20 MG IM SOLR: 20.0000 mg | Freq: Once | INTRAMUSCULAR | Status: DC | PRN

## 2023-09-26 MED ORDER — ONDANSETRON HCL 4 MG PO TABS: 4.0000 mg | ORAL_TABLET | Freq: Three times a day (TID) | ORAL | Status: DC | PRN

## 2023-09-26 MED ORDER — ACETAMINOPHEN 325 MG PO TABS: 650.0000 mg | ORAL_TABLET | ORAL | Status: DC | PRN

## 2023-09-26 NOTE — Consult Note (Signed)
 [facility] Consult Note  Patient Name: Bena Kobel MRN: [id number] DOB: 25-Jan-1981 DATE OF Consult: 09/26/2023  PRIMARY PSYCHIATRIC DIAGNOSES  1.  Schizoaffective Disorder 2.  Reported hx Borderline Personality Disorder 3.  Hx of PTSD, unknown trauma this eval  RECOMMENDATIONS  Inpt psych admission recommended:    [x] YES       []  NO   If yes:       [x]   Pt meets involuntary commitment criteria if not voluntary       []    Pt does not meet involuntary commitment criteria and must be         voluntary. If patient is not voluntary, then discharge is recommended.   Medication recommendations:  scheduled medication needs reconciled  Unclear during this eval what she is being prescribed/refusing; recommend consideration of long term injectable psychotropic medication  Melatonin 3mg  po at bedtime as needed for sleep  Ziprasidone 10 mg po/IM every 6 hrs as needed for agitation/aggressive behaviors (do not exceed 40mg  in 24hrs)   benadryl 50mg  po/IM every 6 hours as needed for agitation/aggressive behaviors/EPS  ativan 2mg  po/IM every 6 hours as needed for agitation/aggressive behaviors      Please ensure K> 4, Mg> 2 and Qtc < 500 when using antipsychotics;monitor for extrapyramidal syndrome (EPS) such as dystonia, akathisia, and tardive dyskinesia Non-Medication recommendations:  suicide and assault precautions; attempt to maintain same staff assignment to build therapeutic rapport    Communication: Treatment team members (and family members if applicable) who were involved in treatment/care discussions and planning, and with whom we spoke or engaged with via secure text/chat, include the following: epic chat Vivi Barrack   I have discussed my assessment and treatment recommendations with the patient. Possible medication side effects/risks/benefits of current regimen.   Importance of medication adherence for medication to be beneficial.   Follow-Up Telepsychiatry C/L  services:            []  We will continue to follow this patient with you.             [x]  Will sign off for now. Please re-consult our service as necessary.  Thank you for involving [place] in the care of this patient. If you have any additional questions or concerns, please call [phone] and ask for me or the provider on-call.  TELEPSYCHIATRY ATTESTATION & CONSENT  As the provider for this telehealth consult, I attest that I verified the patient's identity using two separate identifiers, introduced myself to the patient, provided my credentials, disclosed my location, and performed this encounter via a HIPAA-compliant, real-time, face-to-face, two-way, interactive audio and video platform and with the full consent and agreement of the patient (or guardian as applicable.)  Patient physical location: [facility] ED. Telehealth provider physical location: home office in state of [place]  Video start time: 23:03 pm (Central Time) Video end time: 23:18 pm (Central Time)  IDENTIFYING DATA  Raziya Aveni is a 43 y.o. year-old female for whom a psychiatric consultation has been ordered by the primary provider. The patient was identified using two separate identifiers.  CHIEF COMPLAINT/REASON FOR CONSULT  "Are you trying to help me or fuck me for my future?"   HISTORY OF PRESENT ILLNESS (HPI)  The patient with psych h/o MDD vs bipolar d/o, schizoaffective d/o, GAD, borderline personality disorder. Mother took out IVC due to safety concerns. Patient has been acting erratically and experiencing auditory hallucinations. She broke a lamp at her mother's house today.   Patient  denied having mental health concerns to this provider, she denied taking psychotropic medications; review of her record indicates hx of mental health treatment with last known medication being lamotrigine; unclear at this time when she was last treated and if she is currently being prescribed medications; attempt to call mother Bonita Quin  for collaborative information, no answer to phone number [phone]  Mother filed IVC; reviewed paperwork  which indicated patient diagnosed with bipolar, was recently admitted to [facility]; prescribed medication and refusing to take them, is breaking items at home, appears to be hallucinating by having conversations with herself; hitting and picking with children inside the home; danger to herself and others   "I feel like a science project"  She reports every time she come to this hospital "something bad happens'.  Patient is wearing a facial mask, she is the only person in the room the door is closed, she refuses to remove mask so provider can better understand her; her voice is low, muffled.  She is easily frustrated, with every question asked she turns question back to provider to inquire if provider has ever experienced or how provider feels.  She is uncooperative with interview, she does not appear to be responding to internal stimuli, but there is poor eye contact, appears paranoid, mood is agitated.   sleeping  she declined to discuss; appetite "good" concentration decreased Reviewed active outpatient medication list/reviewed labs. Obtained Collateral information from medical record.  PAST PSYCHIATRIC HISTORY    Previous Psychiatric Hospitalizations: 2-3 times unsure of last admit Previous Detox/Residential treatments: unknown at this time Outpt treatment:  unknown at this time Previous psychotropic medication trials: Abilify, Prozac, Lamictal. trazodone, Vistaril, Cogentin, Trileptal, risperidone, and Risperdal Consta.  Previous mental health diagnosis per client/MEDICAL RECORD NUMBERschizoaffective disorder  Bipolar 1 PTSD, Anxiety, BPD   Suicide attempts/self-injurious behaviors:  denied history of suicidal/homicidal ideation/gestures; denied history of self-harm behaviors; hx of being verbally and physically aggressive with family.   History of trauma/abuse/neglect/exploitation:   unknown at this time  PAST MEDICAL HISTORY  Past Medical History:  Diagnosis Date   Abnormal pap    pt reports abnl pap many years ago.  Nl since then.   Allergy    seasonal   Anemia    Asthma    Bipolar disorder (HCC)    Depression    Palpitations 03/12/2008   PTSD (post-traumatic stress disorder)    Seasonal allergies    Thyroid disease 2009   Graves disease (pt reported resolved); hypothyriodism   Hypothyroidism, myxedema coma (05/04/22), Asthma   HOME MEDICATIONS  Facility Ordered Medications  Medication   acetaminophen (TYLENOL) tablet 650 mg   ondansetron (ZOFRAN) tablet 4 mg   ziprasidone (GEODON) injection 20 mg   PTA Medications  Medication Sig   albuterol (VENTOLIN HFA) 108 (90 Base) MCG/ACT inhaler INHALE 2 PUFFS INTO THE LUNGS EVERY 4 HOURS AS NEEDED FOR WHEEZING OR SHORTNESS OF BREATH (Patient not taking: Reported on 05/17/2022)   montelukast (SINGULAIR) 10 MG tablet Take 10 mg by mouth daily as needed (allergies).   levothyroxine (SYNTHROID) 200 MCG tablet Take 1 tablet (200 mcg total) by mouth daily at 6 (six) AM.   lamoTRIgine (LAMICTAL) 25 MG tablet Take 1 tablet (25 mg total) by mouth 2 (two) times daily for 7 days.    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  .        review of record hx use of marijuana in '08-'09. occasional eats brownies w/ marijuana  other hx unknown at this time       FAMILY HISTORY  Family History  Problem Relation Age of Onset   Drug abuse Father    Depression Maternal Grandmother    Anxiety disorder Maternal Grandmother    COPD Maternal Grandmother    Suicidality Cousin    Depression Cousin    Bipolar disorder Cousin     Hypertension Mother    Depression Mother    Diabetes Paternal Grandfather    COPD Paternal Grandmother    Depression Maternal Aunt    Breast cancer Maternal Aunt    Depression Maternal Aunt    Heart disease Neg Hx    Family Psychiatric History per record review             -suicidality: cousin             - bipolar disorder: cousin - depression: mother, maternal aunt, grandmother, cousin             - drug abuse: father  MENTAL STATUS EXAM (MSE)  Mental Status Exam: General Appearance: Casual  Orientation:  she knew was at [facility]; declined to answer for date/month/year  Memory:  Immediate;   declined to participate Recent;   declined Remote;   declined  Concentration:  Concentration: Poor  Recall:   uncooperative  Attention  Poor  Eye Contact:  Poor  Speech:   soft; muffled wearing facial mask  Language:  Good  Volume:  Decreased  Mood: irritable  Affect:  Constricted  Thought Process:  Irrelevant  Thought Content:  Paranoid Ideation  Suicidal Thoughts:   uncooperative  Homicidal Thoughts:   uncooperative  Judgement:  Impaired  Insight:  Lacking  Psychomotor Activity:  Psychomotor Retardation  Akathisia:  Negative  Fund of Knowledge:  Fair    Assets:  Housing  Cognition:  Impaired,  Mild  ADL's:  Intact  AIMS (if indicated):       VITALS  Blood pressure 115/68, pulse 66, temperature 98.3 F (36.8 C), temperature source Oral, resp. rate 18, height 5\' 6"  (1.676 m), weight 97.1 kg, last menstrual period 09/26/2023, SpO2 100%.  LABS  Admission on 09/26/2023  Component Date Value Ref Range Status   Sodium 09/26/2023 135  135 - 145 mmol/L Final   Potassium 09/26/2023 3.5  3.5 - 5.1 mmol/L Final   Chloride 09/26/2023 101  98 - 111 mmol/L Final   CO2 09/26/2023 28  22 - 32 mmol/L Final   Glucose, Bld 09/26/2023 90  70 - 99 mg/dL Final   Glucose reference range applies only to samples taken after fasting for at least 8 hours.   BUN 09/26/2023 6  6 - 20 mg/dL Final    Creatinine, Ser 09/26/2023 0.81  0.44 - 1.00 mg/dL Final   Calcium 40/98/1191 8.9  8.9 - 10.3 mg/dL Final   Total Protein 47/82/9562 7.1  6.5 - 8.1 g/dL Final   Albumin 13/02/6577 3.9  3.5 - 5.0 g/dL Final   AST 46/96/2952 45 (H)  15 - 41 U/L Final   ALT 09/26/2023 58 (H)  0 - 44 U/L Final   Alkaline  Phosphatase 09/26/2023 40  38 - 126 U/L Final   Total Bilirubin 09/26/2023 0.5  0.0 - 1.2 mg/dL Final   GFR, Estimated 09/26/2023 >60  >60 mL/min Final   Comment: (NOTE) Calculated using the CKD-EPI Creatinine Equation (2021)    Anion gap 09/26/2023 6  5 - 15 Final   Performed at [facility], [address], [place], [place] [address]   Alcohol, Ethyl (B) 09/26/2023 <10  <10 mg/dL Final   Comment: (NOTE) Lowest detectable limit for serum alcohol is 10 mg/dL.  For medical purposes only. Performed at [facility], [address] [place], [place] [address]    Salicylate Lvl 09/26/2023 <7.0 (L)  7.0 - 30.0 mg/dL Final   Performed at [facility], [address], [place], [place] [address]   Acetaminophen (Tylenol), Serum 09/26/2023 <10 (L)  10 - 30 ug/mL Final   Comment: (NOTE) Therapeutic concentrations vary significantly. A range of 10-30 ug/mL  may be an effective concentration for many patients. However, some  are best treated at concentrations outside of this range. Acetaminophen concentrations >150 ug/mL at 4 hours after ingestion  and >50 ug/mL at 12 hours after ingestion are often associated with  toxic reactions.  Performed at [facility], [address], [place], [place] [address]    WBC 09/26/2023 5.5  4.0 - 10.5 K/uL Final   RBC 09/26/2023 3.35 (L)  3.87 - 5.11 MIL/uL Final   Hemoglobin 09/26/2023 9.4 (L)  12.0 - 15.0 g/dL Final   HCT 29/56/2130 30.3 (L)  36.0 - 46.0 % Final   MCV 09/26/2023 90.4  80.0 - 100.0 fL Final   MCH 09/26/2023 28.1  26.0 - 34.0 pg Final   MCHC 09/26/2023 31.0  30.0 - 36.0 g/dL Final   RDW 86/57/8469 14.6  11.5 - 15.5  % Final   Platelets 09/26/2023 216  150 - 400 K/uL Final   nRBC 09/26/2023 0.0  0.0 - 0.2 % Final   Performed at [facility], [address], [place], [place] [address]   Preg, Serum 09/26/2023 NEGATIVE  NEGATIVE Final   Comment:        THE SENSITIVITY OF THIS METHODOLOGY IS >10 mIU/mL. Performed at [facility], [address] [place], [place] [address]     PSYCHIATRIC REVIEW OF SYSTEMS (ROS)  Depression:      [x]  Denies all symptoms of depression [] Depressed mood       [] Insomnia/hypersomnia              [] Fatigue        [] Change in appetite     [] Anhedonia                                [x] Difficulty concentrating      [] Hopelessness             [] Worthlessness [] Guilt/shame                [] Psychomotor agitation/retardation   Mania:     [] Denies all symptoms of mania [] Elevated mood           [x] Irritability         [] Pressured speech         []  Grandiosity         [x]  Decreased need for sleep                                                 []   Increased energy          []  Increase in goal directed activity                                       [] Flight of ideas    []  Excessive involvement in high-risk behaviors                   [x]  Distractibility     Psychosis:     [] Denies all symptoms of psychosis [x] Paranoia         [x]  ?  Auditory Hallucinations          [] Visual hallucinations         [] ELOC        [] IOR                [] Delusions   Suicide:    []  Denies SI/plan/intent [x]  ? Passive SI         []   Active SI         [] Plan           [] Intent   Homicide:  []   Denies HI/plan/intent [x]  ? Passive HI         []  Active HI         [] Plan            [] Intent           [] Identified Target    Additional findings:      Musculoskeletal: No abnormal movements observed      Gait & Station: Laying/Sitting      Pain Screening: Denies      Nutrition & Dental Concerns: none reported  RISK FORMULATION/ASSESSMENT  Is the patient experiencing any suicidal or homicidal  ideations: Yes       Explain if yes: she is uncooperative with discussion; reports of threatening self and others in home due to paranoia and potential hallucinations Protective factors considered for safety management:     Access to adequate health care Positive social support  Risk factors/concerns considered for safety management:  Depression Substance abuse/dependence Physical illness/chronic pain Access to lethal means Impulsivity Aggression Unwillingness to seek help Unmarried  Is there a safety management plan with the patient and treatment team to minimize risk factors and promote protective factors: Yes           Explain: safety precautions Is crisis care placement or psychiatric hospitalization recommended: Yes     Based on my current evaluation and risk assessment, patient is determined at this time to be at:  High risk  *RISK ASSESSMENT Risk assessment is a dynamic process; it is possible that this patient's condition, and risk level, may change. This should be re-evaluated and managed over time as appropriate. Please re-consult psychiatric consult services if additional assistance is needed in terms of risk assessment and management. If your team decides to discharge this patient, please advise the patient how to best access emergency psychiatric services, or to call 911, if their condition worsens or they feel unsafe in any way.  Total time spent in this encounter was 60 minutes with greater than 50% of time spent in counseling and coordination of care.     Dr. Olivia Mackie. Christell Constant, PhD, MSN, APRN, PMHNP-BC, [facility] Helper, NP Telepsychiatry Consult Services

## 2023-09-26 NOTE — ED Provider Notes (Signed)
 [place] EMERGENCY DEPARTMENT AT [facility] Provider Note   CSN: [id number] Arrival date & time: 09/26/23  2138     History {Add pertinent medical, surgical, social history, OB history to HPI:1} Chief Complaint  Patient presents with   IVC    Colleen Peck is a 43 y.o. female.  Patient with psych h/o MDD vs bipolar d/o, schizoaffective d/o, GAD, borderline personality disorder. Mother took out IVC due to safety concerns. Patient has been acting erratically and experiencing auditory hallucinations. She broke a lamp at her mother's house today. During my assessment, patient extremely agitated, somewhat paranoid, makes poor eye contact. Speaks in aggressive tone. Uncooperative with staff and unwilling to contribute to history.  The history is provided by the patient. No language interpreter was used.       Home Medications Prior to Admission medications   Medication Sig Start Date End Date Taking? Authorizing Provider  albuterol (VENTOLIN HFA) 108 (90 Base) MCG/ACT inhaler INHALE 2 PUFFS INTO THE LUNGS EVERY 4 HOURS AS NEEDED FOR WHEEZING OR SHORTNESS OF BREATH Patient not taking: Reported on 05/17/2022 12/03/20   Sandre Kitty, MD  lamoTRIgine (LAMICTAL) 25 MG tablet Take 1 tablet (25 mg total) by mouth 2 (two) times daily for 7 days. 05/19/22 05/26/22  Massengill, Harrold Donath, MD  levothyroxine (SYNTHROID) 200 MCG tablet Take 1 tablet (200 mcg total) by mouth daily at 6 (six) AM. 05/06/22   Rhetta Mura, MD  montelukast (SINGULAIR) 10 MG tablet Take 10 mg by mouth daily as needed (allergies). 08/28/21   [provider]      Allergies    Abilify [aripiprazole], Kiwi extract, Latex, Pork-derived products, and Meloxicam    Review of Systems   Review of Systems Ten systems reviewed and are negative for acute change, except as noted in the HPI.    Physical Exam Updated Vital Signs BP 115/68 (BP Location: Right Arm)   Pulse 66   Temp 98.3 F (36.8 C)  (Oral)   Resp 18   Ht 5\' 6"  (1.676 m)   Wt 97.1 kg   LMP 09/26/2023 (Approximate)   SpO2 100%   BMI 34.54 kg/m   Physical Exam Vitals and nursing note reviewed.  Constitutional:      General: She is not in acute distress.    Appearance: She is well-developed. She is not diaphoretic.     Comments: Nontoxic appearing. Agitated. Poor eye contact.  HENT:     Head: Normocephalic and atraumatic.  Eyes:     General: No scleral icterus.    Conjunctiva/sclera: Conjunctivae normal.  Pulmonary:     Effort: Pulmonary effort is normal. No respiratory distress.     Comments: Respirations even and unlabored Musculoskeletal:        General: Normal range of motion.     Cervical back: Normal range of motion.  Skin:    General: Skin is warm and dry.     Coloration: Skin is not pale.     Findings: No erythema or rash.  Neurological:     Mental Status: She is alert and oriented to person, place, and time.     Coordination: Coordination normal.  Psychiatric:        Mood and Affect: Affect is labile.        Behavior: Behavior is uncooperative and agitated.        Thought Content: Thought content does not include homicidal or suicidal ideation.     ED Results / Procedures / Treatments   Labs (  all labs ordered are listed, but only abnormal results are displayed) Labs Reviewed  COMPREHENSIVE METABOLIC PANEL  ETHANOL  SALICYLATE LEVEL  ACETAMINOPHEN LEVEL  CBC  RAPID URINE DRUG SCREEN, HOSP PERFORMED  HCG, SERUM, QUALITATIVE    EKG None  Radiology No results found.  Procedures Procedures  {Document cardiac monitor, telemetry assessment procedure when appropriate:1}  Medications Ordered in ED Medications  acetaminophen (TYLENOL) tablet 650 mg (has no administration in time range)  ondansetron (ZOFRAN) tablet 4 mg (has no administration in time range)  ziprasidone (GEODON) injection 20 mg (has no administration in time range)    ED Course/ Medical Decision Making/ A&P   {    Click here for ABCD2, HEART and other calculatorsREFRESH Note before signing :1}                              Medical Decision Making Amount and/or Complexity of Data Reviewed Labs: ordered.  Risk OTC drugs. Prescription drug management.   ***  {Document critical care time when appropriate:1} {Document review of labs and clinical decision tools ie heart score, Chads2Vasc2 etc:1}  {Document your independent review of radiology images, and any outside records:1} {Document your discussion with family members, caretakers, and with consultants:1} {Document social determinants of health affecting pt's care:1} {Document your decision making why or why not admission, treatments were needed:1} Final Clinical Impression(s) / ED Diagnoses Final diagnoses:  None    Rx / DC Orders ED Discharge Orders     None

## 2023-09-26 NOTE — BH Assessment (Signed)
@  2306, Patient was deferred to [facility] for a telepsych assessment. Consult time and provider pending.

## 2023-09-26 NOTE — ED Notes (Signed)
 Pt changed into purple scrubs, belongings taken and placed in locker 3, and was wand by security.
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
## 2023-09-27 ENCOUNTER — Other Ambulatory Visit: Payer: Self-pay

## 2023-09-27 DIAGNOSIS — F259 Schizoaffective disorder, unspecified: Secondary | ICD-10-CM | POA: Diagnosis present

## 2023-09-27 DIAGNOSIS — F603 Borderline personality disorder: Secondary | ICD-10-CM | POA: Diagnosis present

## 2023-09-27 MED ORDER — QUETIAPINE FUMARATE ER 50 MG PO TB24: 100.0000 mg | ORAL_TABLET | Freq: Every day | ORAL | Status: DC

## 2023-09-27 NOTE — ED Notes (Signed)
 PT on TTS cart call

## 2023-09-27 NOTE — ED Notes (Signed)
Ambulatory to bathroom. Calm, cooperative. No signs of distress.  ?

## 2023-09-27 NOTE — ED Notes (Signed)
 IVC complete and is located in the Purple Zone.

## 2023-09-27 NOTE — ED Notes (Signed)
 Page sent to [facility] to initiate nurse to nurse report

## 2023-09-27 NOTE — ED Provider Notes (Signed)
 Emergency Medicine Observation Re-evaluation Note  Colleen Peck is a 43 y.o. female, seen on rounds today.  Pt initially presented to the ED for complaints of IVC Currently, the patient is resting in bed without acute agitation.  Physical Exam  BP 115/68 (BP Location: Right Arm)   Pulse 66   Temp 98.3 F (36.8 C) (Oral)   Resp 18   Ht 5\' 6"  (1.676 m)   Wt 97.1 kg   LMP 09/26/2023 (Approximate)   SpO2 100%   BMI 34.54 kg/m  Physical Exam General: Resting without agitation Cardiac: Not tachycardic and no murmur on my exam Lungs: Breath sounds bilaterally Psych: No agitation at this time  ED Course / MDM  EKG:   I have reviewed the labs performed to date as well as medications administered while in observation.  Recent changes in the last 24 hours include none reported by overnight nursing.  Plan  Current plan is for awaiting psychiatric recommendations.    Karrine Kluttz, Canary Brim, MD 09/27/23 [phone]

## 2023-09-27 NOTE — Progress Notes (Signed)
 Pt has been accepted to [facility] TODAY 09/27/2023 Bed assignment: Main campus  Pt meets inpatient criteria per: Eligha Bridegroom NP  Attending Physician will be Loni Beckwith, MD  Report can be called to: [phone] (this is a pager, please leave call-back number when giving report)  Pt can arrive ASAP   Care Team Notified: Eligha Bridegroom NP, [name] Southard RN  [place] Soul Deveney LCSW-A   09/27/2023 2:09 PM

## 2023-09-27 NOTE — Progress Notes (Signed)
 LCSW Progress Note  [id number]   Colleen Peck  09/27/2023  1:58 PM  Description:   Inpatient Psychiatric Referral  Patient was recommended inpatient per Eligha Bridegroom NP. There are no available beds at [facility], per [facility] Rona Ravens RN). Patient was referred to the following out of network facilities:   Destination  Service Provider Address Phone Fax  [facility] [address] [place] [address] [phone]  [facility] [address] [place] [address] [phone]  [facility] [address] [place] [address] [phone]  [facility] [address] [place] [address] [phone]  [facility] Patient Placement [facility], [place] [phone]  [facility] [address] [place], [place] [address] [phone]  [facility] [address] [place] [address] [phone]  [facility] [address] [place] [address] [phone]  [facility] [address], [place] [address] [phone]  [facility] [address] [place] [address] [phone]  [facility] EFAX [address] [place], [place] [phone]      Situation ongoing, CSW to continue following and update chart as more information becomes available.      Colleen Peck, MSW, LCSW  09/27/2023 1:58 PM

## 2023-09-28 NOTE — ED Notes (Signed)
 IVC CASE #[id number]

## 2023-09-28 NOTE — ED Notes (Signed)
IVC CURRENT

## 2023-09-28 NOTE — Consult Note (Signed)
 Pt has been accepted to [facility] TODAY 09/27/2023 Bed assignment: Main campus   Pt meets inpatient criteria per: Eligha Bridegroom NP   Attending Physician will be Loni Beckwith, MD   Report can be called to: [phone] (this is a pager, please leave call-back number when giving report)   Pt can arrive ASAP

## 2023-09-28 NOTE — ED Provider Notes (Addendum)
 Emergency Medicine Observation Re-evaluation Note  Colleen Peck is a 43 y.o. female, seen on rounds today.  Pt initially presented to the ED for complaints of IVC Currently, the patient is awaiting placement for inpatient behavioral health care.  Patient is IVC.Marland Kitchen  Physical Exam  BP 104/74 (BP Location: Right Arm)   Pulse (!) 59   Temp 98 F (36.7 C) (Oral)   Resp 16   Ht 1.676 m (5\' 6" )   Wt 97.1 kg   LMP 09/26/2023 (Approximate)   SpO2 100%   BMI 34.54 kg/m  Physical Exam General: Awake and comfortable Cardiac:  Lungs: No respiratory distress Psych: Baseline  ED Course / MDM  EKG:   I have reviewed the labs performed to date as well as medications administered while in observation.  Recent changes in the last 24 hours include termination by behavioral health that she needs inpatient behavioral health admission.  Plan  Current plan is for inpatient behavioral health admission.  Patient is IVC.    Colleen Mulders, MD 09/28/23 [phone]  Patient has been accepted by [facility].  Provider is Sibyl Parr will be doing the transportation.  EMTALA completed.    Colleen Mulders, MD 09/28/23 1241

## 2024-03-03 ENCOUNTER — Emergency Department (EMERGENCY_DEPARTMENT_HOSPITAL)
Admission: EM | Admit: 2024-03-03 | Discharge: 2024-03-05 | Disposition: A | Discharge status: Other Acute Inpatient Hospital | Attending: Emergency Medicine | Admitting: Emergency Medicine

## 2024-03-03 DIAGNOSIS — F209 Schizophrenia, unspecified: Secondary | ICD-10-CM | POA: Diagnosis not present

## 2024-03-03 DIAGNOSIS — Z91040 Latex allergy status: Secondary | ICD-10-CM | POA: Insufficient documentation

## 2024-03-03 DIAGNOSIS — J45909 Unspecified asthma, uncomplicated: Secondary | ICD-10-CM | POA: Insufficient documentation

## 2024-03-03 DIAGNOSIS — D649 Anemia, unspecified: Secondary | ICD-10-CM | POA: Insufficient documentation

## 2024-03-03 DIAGNOSIS — F200 Paranoid schizophrenia: Secondary | ICD-10-CM | POA: Insufficient documentation

## 2024-03-03 DIAGNOSIS — F22 Delusional disorders: Secondary | ICD-10-CM | POA: Diagnosis not present

## 2024-03-03 DIAGNOSIS — F603 Borderline personality disorder: Secondary | ICD-10-CM | POA: Diagnosis present

## 2024-03-03 DIAGNOSIS — F250 Schizoaffective disorder, bipolar type: Secondary | ICD-10-CM | POA: Diagnosis not present

## 2024-03-03 DIAGNOSIS — F259 Schizoaffective disorder, unspecified: Secondary | ICD-10-CM | POA: Diagnosis present

## 2024-03-03 LAB — CBC WITH DIFFERENTIAL/PLATELET
Abs Immature Granulocytes: 0.01 K/uL (ref 0.00–0.07)
Basophils Absolute: 0 K/uL (ref 0.0–0.1)
Basophils Relative: 1 %
Eosinophils Absolute: 0.1 K/uL (ref 0.0–0.5)
Eosinophils Relative: 2 %
HCT: 33.8 % — ABNORMAL LOW (ref 36.0–46.0)
Hemoglobin: 10 g/dL — ABNORMAL LOW (ref 12.0–15.0)
Immature Granulocytes: 0 %
Lymphocytes Relative: 43 %
Lymphs Abs: 1.9 K/uL (ref 0.7–4.0)
MCH: 24.9 pg — ABNORMAL LOW (ref 26.0–34.0)
MCHC: 29.6 g/dL — ABNORMAL LOW (ref 30.0–36.0)
MCV: 84.3 fL (ref 80.0–100.0)
Monocytes Absolute: 0.3 K/uL (ref 0.1–1.0)
Monocytes Relative: 6 %
Neutro Abs: 2.1 K/uL (ref 1.7–7.7)
Neutrophils Relative %: 48 %
Platelets: 222 K/uL (ref 150–400)
RBC: 4.01 MIL/uL (ref 3.87–5.11)
RDW: 19.1 % — ABNORMAL HIGH (ref 11.5–15.5)
WBC: 4.4 K/uL (ref 4.0–10.5)
nRBC: 0 % (ref 0.0–0.2)

## 2024-03-03 LAB — COMPREHENSIVE METABOLIC PANEL WITH GFR
ALT: 22 U/L (ref 0–44)
AST: 31 U/L (ref 15–41)
Albumin: 4.3 g/dL (ref 3.5–5.0)
Alkaline Phosphatase: 37 U/L — ABNORMAL LOW (ref 38–126)
Anion gap: 13 (ref 5–15)
BUN: 7 mg/dL (ref 6–20)
CO2: 24 mmol/L (ref 22–32)
Calcium: 9.7 mg/dL (ref 8.9–10.3)
Chloride: 104 mmol/L (ref 98–111)
Creatinine, Ser: 0.81 mg/dL (ref 0.44–1.00)
GFR, Estimated: >60 mL/min (ref >60)
Glucose, Bld: 87 mg/dL (ref 70–99)
Potassium: 3.7 mmol/L (ref 3.5–5.1)
Sodium: 141 mmol/L (ref 135–145)
Total Bilirubin: 0.5 mg/dL (ref 0.0–1.2)
Total Protein: 8.1 g/dL (ref 6.5–8.1)

## 2024-03-03 LAB — HCG, SERUM, QUALITATIVE: Preg, Serum: NEGATIVE

## 2024-03-03 MED ORDER — HALOPERIDOL 5 MG PO TABS: 5.0000 mg | ORAL_TABLET | Freq: Three times a day (TID) | ORAL | Status: DC | PRN

## 2024-03-03 MED ORDER — HALOPERIDOL LACTATE 5 MG/ML IJ SOLN: 5.0000 mg | Freq: Three times a day (TID) | INTRAMUSCULAR | Status: DC | PRN

## 2024-03-03 MED ORDER — LAMOTRIGINE 25 MG PO TABS
25.0000 mg | ORAL_TABLET | Freq: Every day | ORAL | Status: DC
  Administered 2024-03-03 – 2024-03-05 (×2): 25 mg via ORAL
  Filled 2024-03-03 (×4): qty 1

## 2024-03-03 MED ORDER — LAMOTRIGINE 25 MG PO TABS: 25.0000 mg | ORAL_TABLET | Freq: Two times a day (BID) | ORAL | Status: DC

## 2024-03-03 MED ORDER — DIPHENHYDRAMINE HCL 25 MG PO CAPS: 50.0000 mg | ORAL_CAPSULE | Freq: Three times a day (TID) | ORAL | Status: DC | PRN

## 2024-03-03 MED ORDER — LORAZEPAM 1 MG PO TABS: 2.0000 mg | ORAL_TABLET | Freq: Three times a day (TID) | ORAL | Status: DC | PRN

## 2024-03-03 MED ORDER — DIPHENHYDRAMINE HCL 50 MG/ML IJ SOLN: 50.0000 mg | Freq: Three times a day (TID) | INTRAMUSCULAR | Status: DC | PRN

## 2024-03-03 MED ORDER — MIDAZOLAM HCL 2 MG/2ML IJ SOLN: 4.0000 mg | Freq: Three times a day (TID) | INTRAMUSCULAR | Status: DC | PRN

## 2024-03-03 NOTE — ED Notes (Addendum)
 Pt checked by security. Pt belongings in pt belonging bag in designed pt belonging area labeled Hall C Pt belonging

## 2024-03-03 NOTE — ED Notes (Signed)
 Pt refused to answer questions with the nurse, currently talking to PA

## 2024-03-03 NOTE — Progress Notes (Signed)
 Patient has been denied by [facility] due to no appropriate beds available. Patient meets BH inpatient criteria per Efrain Patient, NP. Patient has been faxed out to the following facilities:   [facility]  [address] [place] [place] [address] [phone]  [facility]  [address] [place] [address] [phone]  [facility] Inpatient [facility]  [place] [phone]  [facility]  [address] [place] [address] [phone]  [facility]  [address], [place] [address] [phone]  [facility]  [address] [place], [place] [address] [phone]  [facility] Patient Placement  [facility], [place] [phone]  [facility]  [address] [place] [address] [phone]  [facility]  [address] [place] [address] [phone]  [facility]  [address] [place], [place] [phone]  [facility]  [address] [place] [address] [phone]  [facility]  [address] [place] [address] [phone]  [facility]  [address] [address] [phone]  [facility]  [address] [place] [address] [phone]  [facility]  [address] [place] [address] [phone]  [facility]  [address] [place] [address] [phone]  [facility]  [address], [place] [address] [phone]   [facility]  [address], [place] [address] [phone]   Bunnie Gallop, MSW, LCSW-A  3:41 PM 03/03/2024
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
## 2024-03-03 NOTE — ED Notes (Signed)
 I offered patient some food and drink several times and she declined
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
## 2024-03-04 NOTE — ED Notes (Signed)
 Patient is resting at this time.

## 2024-03-04 NOTE — ED Notes (Signed)
 Pt in hallway bed with blanket over head. Chest rising and falling.

## 2024-03-04 NOTE — ED Notes (Signed)
 Pt just began eating her lunch.

## 2024-03-04 NOTE — ED Notes (Signed)
 Pt's lunch tray came. Gave pt her lunch tray. 1 plastic spoon 1 plastic fork on tray.

## 2024-03-04 NOTE — ED Notes (Signed)
 Update provided to mother. Mother would like to be notified on placement and departure time.

## 2024-03-04 NOTE — ED Notes (Signed)
 Pt's dinner tray arrived. Gave tray to pt. 1 plastic fork and 1 plastic spoon on tray.

## 2024-03-04 NOTE — ED Notes (Signed)
 Placed pt's belongings into locker number 29.

## 2024-03-04 NOTE — ED Notes (Signed)
 Patient has been given something to eat , additionally with a snack

## 2024-03-04 NOTE — Progress Notes (Signed)
 CSW sent additional [facility] referral documentation to [facility] for continued review. CSW will continue to monitor the patient to secure recommended disposition.    Aseneth Hack, MSW, LCSW-A  4:46 PM 03/04/2024
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
## 2024-03-04 NOTE — ED Notes (Signed)
 Pt stated that she's done with her lunch tray. Only ate some of the fruit. Gave pt a cup of apple juice per pt's request.

## 2024-03-04 NOTE — Progress Notes (Signed)
 Patient has been denied by [facility] due to no appropriate beds available. Patient meets BH inpatient criteria per Efrain Patient, NP. Patient has been faxed out to the following facilities:    [facility]  [address] [place] [place] [address] [phone]  [facility]  [address] [place] [address] [phone]  [facility]  [place] [phone]  [facility]  [address], [place] [address] [phone]  [facility]  [address] [place] [address] [phone]  [facility]  [address] [place], [place] [address] [phone]  [facility] Patient Placement  [facility], [place] [phone]  [facility]  [address] [place] [address] [phone]  [facility]  [address], [place] [address] [phone]  [facility]  [address] [place], [place] [phone]  [facility]  [address] [place] [address] [phone]  [facility]  [address], [place] [address] [phone]  [facility]  [address] [address] [phone]  [facility]  [address] [place] [address] [phone]  [facility]  [address], [place] [address] [phone]  [facility]  [address] [place] [address] [phone]  [facility]  [address] [place] [address] [phone]   [facility]  [address] [place] [address] [phone]   Bunnie Gallop, MSW, LCSW-A  4:14 PM 03/04/2024

## 2024-03-05 ENCOUNTER — Inpatient Hospital Stay (HOSPITAL_COMMUNITY)
Admission: AD | Admit: 2024-03-05 | Discharge: 2024-03-21 | DRG: 885 | Disposition: A | Discharge status: Home / Self Care | Source: Intra-hospital | Attending: Psychiatry | Admitting: Psychiatry

## 2024-03-05 ENCOUNTER — Encounter (HOSPITAL_COMMUNITY): Payer: Self-pay | Admitting: Adult Health

## 2024-03-05 ENCOUNTER — Other Ambulatory Visit: Payer: Self-pay

## 2024-03-05 DIAGNOSIS — F6381 Intermittent explosive disorder: Secondary | ICD-10-CM | POA: Diagnosis present

## 2024-03-05 DIAGNOSIS — E039 Hypothyroidism, unspecified: Secondary | ICD-10-CM | POA: Diagnosis present

## 2024-03-05 DIAGNOSIS — E669 Obesity, unspecified: Secondary | ICD-10-CM | POA: Diagnosis present

## 2024-03-05 DIAGNOSIS — Z91148 Patient's other noncompliance with medication regimen for other reason: Secondary | ICD-10-CM

## 2024-03-05 DIAGNOSIS — Z8249 Family history of ischemic heart disease and other diseases of the circulatory system: Secondary | ICD-10-CM

## 2024-03-05 DIAGNOSIS — Z6834 Body mass index (BMI) 34.0-34.9, adult: Secondary | ICD-10-CM | POA: Diagnosis not present

## 2024-03-05 DIAGNOSIS — F250 Schizoaffective disorder, bipolar type: Principal | ICD-10-CM | POA: Diagnosis present

## 2024-03-05 DIAGNOSIS — Z91014 Allergy to mammalian meats: Secondary | ICD-10-CM | POA: Diagnosis not present

## 2024-03-05 DIAGNOSIS — Z833 Family history of diabetes mellitus: Secondary | ICD-10-CM | POA: Diagnosis not present

## 2024-03-05 DIAGNOSIS — Z91018 Allergy to other foods: Secondary | ICD-10-CM

## 2024-03-05 DIAGNOSIS — Z813 Family history of other psychoactive substance abuse and dependence: Secondary | ICD-10-CM

## 2024-03-05 DIAGNOSIS — F4310 Post-traumatic stress disorder, unspecified: Secondary | ICD-10-CM | POA: Diagnosis present

## 2024-03-05 DIAGNOSIS — F259 Schizoaffective disorder, unspecified: Secondary | ICD-10-CM | POA: Diagnosis not present

## 2024-03-05 DIAGNOSIS — Z888 Allergy status to other drugs, medicaments and biological substances status: Secondary | ICD-10-CM | POA: Diagnosis not present

## 2024-03-05 DIAGNOSIS — Z91040 Latex allergy status: Secondary | ICD-10-CM

## 2024-03-05 DIAGNOSIS — Z803 Family history of malignant neoplasm of breast: Secondary | ICD-10-CM

## 2024-03-05 DIAGNOSIS — Z62810 Personal history of physical and sexual abuse in childhood: Secondary | ICD-10-CM | POA: Diagnosis not present

## 2024-03-05 DIAGNOSIS — R45851 Suicidal ideations: Secondary | ICD-10-CM | POA: Diagnosis present

## 2024-03-05 DIAGNOSIS — F603 Borderline personality disorder: Secondary | ICD-10-CM | POA: Diagnosis present

## 2024-03-05 DIAGNOSIS — Z825 Family history of asthma and other chronic lower respiratory diseases: Secondary | ICD-10-CM | POA: Diagnosis not present

## 2024-03-05 DIAGNOSIS — J45909 Unspecified asthma, uncomplicated: Secondary | ICD-10-CM | POA: Diagnosis present

## 2024-03-05 DIAGNOSIS — F411 Generalized anxiety disorder: Secondary | ICD-10-CM | POA: Diagnosis present

## 2024-03-05 DIAGNOSIS — Z79890 Hormone replacement therapy (postmenopausal): Secondary | ICD-10-CM

## 2024-03-05 DIAGNOSIS — Z818 Family history of other mental and behavioral disorders: Secondary | ICD-10-CM

## 2024-03-05 DIAGNOSIS — Z79899 Other long term (current) drug therapy: Secondary | ICD-10-CM

## 2024-03-05 DIAGNOSIS — K5900 Constipation, unspecified: Secondary | ICD-10-CM | POA: Diagnosis present

## 2024-03-05 HISTORY — DX: Hypothyroidism, unspecified: E03.9

## 2024-03-05 MED ORDER — DIPHENHYDRAMINE HCL 50 MG/ML IJ SOLN
50.0000 mg | Freq: Three times a day (TID) | INTRAMUSCULAR | Status: DC | PRN
  Filled 2024-03-05: qty 1

## 2024-03-05 MED ORDER — HALOPERIDOL LACTATE 5 MG/ML IJ SOLN
5.0000 mg | Freq: Three times a day (TID) | INTRAMUSCULAR | Status: DC | PRN
  Administered 2024-03-06: 5 mg via INTRAMUSCULAR
  Filled 2024-03-05: qty 1

## 2024-03-05 MED ORDER — LORAZEPAM 2 MG/ML IJ SOLN: 2.0000 mg | Freq: Three times a day (TID) | INTRAMUSCULAR | Status: DC | PRN

## 2024-03-05 MED ORDER — LAMOTRIGINE 25 MG PO TABS
25.0000 mg | ORAL_TABLET | Freq: Every day | ORAL | Status: DC
  Filled 2024-03-05: qty 1

## 2024-03-05 MED ORDER — HYDROXYZINE HCL 25 MG PO TABS
25.0000 mg | ORAL_TABLET | Freq: Three times a day (TID) | ORAL | Status: DC | PRN
  Administered 2024-03-08 – 2024-03-20 (×15): 25 mg via ORAL
  Filled 2024-03-05: qty 10
  Filled 2024-03-05 (×15): qty 1

## 2024-03-05 MED ORDER — ALUM & MAG HYDROXIDE-SIMETH 200-200-20 MG/5ML PO SUSP: 30.0000 mL | ORAL | Status: DC | PRN

## 2024-03-05 MED ORDER — ACETAMINOPHEN 325 MG PO TABS
650.0000 mg | ORAL_TABLET | Freq: Four times a day (QID) | ORAL | Status: DC | PRN
  Administered 2024-03-15: 650 mg via ORAL
  Filled 2024-03-05: qty 2

## 2024-03-05 MED ORDER — TRAZODONE HCL 50 MG PO TABS
50.0000 mg | ORAL_TABLET | Freq: Every evening | ORAL | Status: DC | PRN
  Administered 2024-03-08 – 2024-03-09 (×3): 50 mg via ORAL
  Filled 2024-03-05 (×3): qty 1

## 2024-03-05 MED ORDER — MAGNESIUM HYDROXIDE 400 MG/5ML PO SUSP: 30.0000 mL | Freq: Every day | ORAL | Status: DC | PRN

## 2024-03-05 MED ORDER — ALBUTEROL SULFATE HFA 108 (90 BASE) MCG/ACT IN AERS: 2.0000 | INHALATION_SPRAY | RESPIRATORY_TRACT | Status: DC | PRN

## 2024-03-05 MED ORDER — DIPHENHYDRAMINE HCL 25 MG PO CAPS
50.0000 mg | ORAL_CAPSULE | Freq: Three times a day (TID) | ORAL | Status: DC | PRN
  Filled 2024-03-05: qty 2

## 2024-03-05 MED ORDER — LORAZEPAM 2 MG/ML IJ SOLN
2.0000 mg | Freq: Three times a day (TID) | INTRAMUSCULAR | Status: DC | PRN
  Administered 2024-03-06: 2 mg via INTRAMUSCULAR
  Filled 2024-03-05: qty 1

## 2024-03-05 MED ORDER — DIPHENHYDRAMINE HCL 50 MG/ML IJ SOLN
50.0000 mg | Freq: Three times a day (TID) | INTRAMUSCULAR | Status: DC | PRN
  Administered 2024-03-06: 50 mg via INTRAMUSCULAR

## 2024-03-05 MED ORDER — HALOPERIDOL LACTATE 5 MG/ML IJ SOLN: 10.0000 mg | Freq: Three times a day (TID) | INTRAMUSCULAR | Status: DC | PRN

## 2024-03-05 MED ORDER — HALOPERIDOL 5 MG PO TABS
5.0000 mg | ORAL_TABLET | Freq: Three times a day (TID) | ORAL | Status: DC | PRN
  Filled 2024-03-05: qty 1
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
## 2024-03-05 NOTE — Plan of Care (Signed)

## 2024-03-05 NOTE — ED Notes (Signed)
 Patient has been alert.  Quiet, guarded.  Not answering assessment questions. Paranoia noted. Patient took AM medication with encouragement.

## 2024-03-05 NOTE — Progress Notes (Signed)
   03/05/24 2100  Psych Admission Type (Psych Patients Only)  Admission Status Involuntary  Psychosocial Assessment  Patient Complaints Suspiciousness  Eye Contact Glaring  Facial Expression Flat  Affect Irritable  Speech Soft  Interaction Guarded  Motor Activity Slow  Appearance/Hygiene Unremarkable  Behavior Characteristics Unwilling to participate  Mood Suspicious  Aggressive Behavior  Effect No apparent injury  Thought Process  Coherency Unable to assess  Content Paranoia  Delusions UTA  Perception UTA  Hallucination UTA  Judgment Impaired  Confusion None  Danger to Self  Current suicidal ideation? Denies

## 2024-03-05 NOTE — Tx Team (Signed)
 Initial Treatment Plan 03/05/2024 6:55 PM Colleen Peck FMW:[id number]    PATIENT STRESSORS: Other: She would not say     PATIENT STRENGTHS: Other: UTA   PATIENT IDENTIFIED PROBLEMS:                      DISCHARGE CRITERIA:  Ability to meet basic life and health needs  PRELIMINARY DISCHARGE PLAN: Unsure where she is going after this  PATIENT/FAMILY INVOLVEMENT: This treatment plan has been presented to and reviewed with the patient, Colleen Peck.  The patient and family have been given the opportunity to ask questions and make suggestions.  Colleen Wolm Morel, RN 03/05/2024, 6:55 PM

## 2024-03-05 NOTE — Progress Notes (Signed)
 Pt has been accepted to [facility] on 03/05/2024 . Bed assignment: 508-1   Pt meets inpatient criteria per Efrain Patient, NP  Attending Physician will be Dr Kennyth   Report can be called to: -Adult unit: [phone]  Pt can arrive after 5pm    Care Team Notified: [facility], RN, Landry Bull, RN, Bernadette Kuba, NP

## 2024-03-05 NOTE — Progress Notes (Signed)
 Patient denied SI/HI but when asked if has hallucinations or delusions she nodded her head. Patient would not go into details. Patient was uncooperative during admission she reports being paranoid. She would not sign papers.

## 2024-03-05 NOTE — ED Notes (Signed)
 Patient off unit to behavioral health hospital per provider. Patient alert and no s/s of distress at this time. Discharge information and belongings given to [employer] for transport. Patient ambulatory off unit, escorted and transported by [employer].

## 2024-03-06 DIAGNOSIS — F250 Schizoaffective disorder, bipolar type: Secondary | ICD-10-CM | POA: Diagnosis not present

## 2024-03-06 DIAGNOSIS — F411 Generalized anxiety disorder: Secondary | ICD-10-CM | POA: Diagnosis not present

## 2024-03-06 DIAGNOSIS — F4310 Post-traumatic stress disorder, unspecified: Secondary | ICD-10-CM

## 2024-03-06 MED ORDER — HALOPERIDOL 5 MG PO TABS: 5.0000 mg | ORAL_TABLET | Freq: Two times a day (BID) | ORAL | Status: DC

## 2024-03-06 MED ORDER — HALOPERIDOL 5 MG PO TABS
5.0000 mg | ORAL_TABLET | Freq: Two times a day (BID) | ORAL | Status: DC
  Administered 2024-03-06 – 2024-03-21 (×30): 5 mg via ORAL
  Filled 2024-03-06 (×12): qty 1
  Filled 2024-03-06: qty 14
  Filled 2024-03-06 (×18): qty 1

## 2024-03-06 NOTE — Progress Notes (Signed)
(  Sleep Hours) -8 (Any PRNs that were needed, meds refused, or side effects to meds)- none (Any disturbances and when (visitation, over night)-Pt woke from her sleep yelling and crying twice,  (Concerns raised by the patient)- none (SI/HI/AVH)-pt  refused to answer
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
## 2024-03-06 NOTE — Plan of Care (Signed)

## 2024-03-06 NOTE — BHH Group Notes (Signed)
 Adult Psychoeducational Group Note  Date:  03/06/2024 Time:  9:59 AM  Group Topic/Focus:  Goals Group:   The focus of this group is to help patients establish daily goals to achieve during treatment and discuss how the patient can incorporate goal setting into their daily lives to aide in recovery. Orientation:   The focus of this group is to educate the patient on the purpose and policies of crisis stabilization and provide a format to answer questions about their admission.  The group details unit policies and expectations of patients while admitted.  Participation Level:  Did Not Attend  Additional Comments:  Pt was invited but did not attend orientation/goals group.  Niel CHRISTELLA Nightingale 03/06/2024, 9:59 AM

## 2024-03-06 NOTE — Progress Notes (Signed)
 Recreation Therapy Notes  LRT attempted to complete recreation therapy assessment with patient. Patient lifted her head off the pillow when LRT entered the room but wouldn't respond to LRT. Patient closed her eyes and laid her head back on the pillow. LRT will attempt later to complete assessment.    Jemiah Cuadra-McCall, LRT,CTRS Abdullahi Vallone A Rowan Blaker-McCall 03/06/2024 2:04 PM

## 2024-03-06 NOTE — [facility] Group Notes (Signed)
 Adult Psychoeducational Group Note Date:  03/05/24 Time:  8pm  Group Topic/Focus:    Participation Level:    Participation Quality:    Affect:    Cognitive:    Insight:   Engagement in Group:    Modes of Intervention:    Additional Comments:  Pt did not attend group.   Drue Pouch 03/06/2024, 5:03 AM

## 2024-03-06 NOTE — BHH Suicide Risk Assessment (Signed)
 Suicide Risk Assessment  Admission Assessment    [facility] Admission Suicide Risk Assessment   Nursing information obtained from:  Patient Demographic factors:  NA Current Mental Status:  Suicidal ideation indicated by patient Loss Factors:  Loss of significant relationship Historical Factors:  Family history of mental illness or substance abuse Risk Reduction Factors:  Employed, Living with another person, especially a relative  Total Time spent with patient: 30 minutes Principal Problem: Schizoaffective disorder (HCC) Diagnosis:  Principal Problem:   Schizoaffective disorder (HCC) Active Problems:   PTSD (post-traumatic stress disorder)   GAD (generalized anxiety disorder)  Subjective Data: See H&P  Continued Clinical Symptoms:  Alcohol Use Disorder Identification Test Final Score (AUDIT): 0 The Alcohol Use Disorders Identification Test, Guidelines for Use in Primary Care, Second Edition.  World Science writer [facility]). Score between 0-7:  no or low risk or alcohol related problems. Score between 8-15:  moderate risk of alcohol related problems. Score between 16-19:  high risk of alcohol related problems. Score 20 or above:  warrants further diagnostic evaluation for alcohol dependence and treatment.   CLINICAL FACTORS:   More than one psychiatric diagnosis Currently Psychotic Previous Psychiatric Diagnoses and Treatments Medical Diagnoses and Treatments/Surgeries   Musculoskeletal: Strength & Muscle Tone: within normal limits Gait & Station: normal Patient leans: N/A  Psychiatric Specialty Exam:  Presentation  General Appearance: Disheveled  Eye Contact:None  Speech:Other (comment) (Does not respond to assessment questions)  Speech Volume:-- (Does not respond to assessment questions)  Handedness:-- (Does not respond to assessment questions)   Mood and Affect  Mood:-- (Paranoid)  Affect:Flat; Restricted; Tearful   Thought Process  Thought  Processes:Disorganized  Descriptions of Associations:-- (Does not respond to assessment questions)  Orientation:-- (Does not respond to assessment questions)  Thought Content:-- (Does not respond to assessment questions)  History of Schizophrenia/Schizoaffective disorder:No data recorded Duration of Psychotic Symptoms:No data recorded Hallucinations:Hallucinations: -- (Does not respond to assessment questions)  Ideas of Reference:-- (Does not respond to assessment questions)  Suicidal Thoughts:Suicidal Thoughts: -- (Does not respond to assessment questions)  Homicidal Thoughts:Homicidal Thoughts: -- (Does not respond to assessment questions)   Sensorium  Memory:-- (Does not respond to assessment questions)  Judgment:Impaired  Insight:Lacking   Executive Functions  Concentration:Poor  Attention Span:Poor  Recall:-- (Does not respond to assessment questions)  Fund of Knowledge:-- (Does not respond to assessment questions)  Language:-- (Does not respond to assessment questions)   Psychomotor Activity  Psychomotor Activity:No data recorded  Assets  Assets:-- (Does not respond to assessment questions)   Sleep  Sleep:Sleep: -- (Does not respond to assessment questions)    Physical Exam: Physical Exam ROS Blood pressure 109/88, pulse 66, temperature 98.4 F (36.9 C), temperature source Axillary, resp. rate 20, height 5' 6 (1.676 m), weight 97.5 kg, SpO2 100%. Body mass index is 34.7 kg/m.   COGNITIVE FEATURES THAT CONTRIBUTE TO RISK:  Loss of executive function and Thought constriction (tunnel vision)    SUICIDE RISK:   Moderate:  Frequent suicidal ideation with limited intensity, and duration, some specificity in terms of plans, no associated intent, good self-control, limited dysphoria/symptomatology, some risk factors present, and identifiable protective factors, including available and accessible social support.  PLAN OF CARE: See H&P  I certify that  inpatient services furnished can reasonably be expected to improve the patient's condition.   Blair Chiquita Hint, NP 03/06/2024, 12:45 PM

## 2024-03-06 NOTE — BHH Counselor (Signed)
 Adult Comprehensive Assessment  Patient ID: Colleen Peck, female   DOB: 09-04-1980, 43 y.o.   MRN: [id number]  First attempt:  Patient exhibited emotional dysregulation and received agitation protocol around 9:30 AM; she was asleep throughout the day.    Shiree Altemus O Tesneem Dufrane, LCSWA 03/06/2024

## 2024-03-06 NOTE — Progress Notes (Signed)
 Patient lying in bed, calm. Pt acknowledges nurse in the room, does not answer when asked how she is feeling and if she needs anything. Pt being monitored, q15 safety checks in place.

## 2024-03-06 NOTE — Progress Notes (Signed)
 Patient in bed crying out loud, patient not answering questions. Pt points to her hers and covers them while she is crying. Patient offered agitation protocol, patient given agitation protocol for moderate agitation, sitting in bed sobbing, no acute distress noted.

## 2024-03-06 NOTE — Plan of Care (Signed)
   Problem: Education: Goal: Knowledge of [facility] Education information/materials will improve Outcome: Progressing   Problem: Activity: Goal: Interest or engagement in activities will improve Outcome: Progressing   Problem: Coping: Goal: Ability to verbalize frustrations and anger appropriately will improve Outcome: Progressing

## 2024-03-06 NOTE — Progress Notes (Signed)
 Psychoeducational Group Note  Date:  03/06/2024 Time:  2128  Group Topic/Focus:  Wrap-Up Group:   The focus of this group is to help patients review their daily goal of treatment and discuss progress on daily workbooks.  Participation Level: Did Not Attend  Participation Quality:  Not Applicable  Affect:  Not Applicable  Cognitive:  Not Applicable  Insight:  Not Applicable  Engagement in Group: Not Applicable  Additional Comments:  The patient did not attend group.   Wonda Goodgame S 03/06/2024, 9:28 PM

## 2024-03-06 NOTE — Progress Notes (Signed)
   03/06/24 0800  Psych Admission Type (Psych Patients Only)  Admission Status Involuntary  Psychosocial Assessment  Patient Complaints Suspiciousness;Crying spells  Eye Contact Brief  Facial Expression Anxious  Affect Irritable  Speech Soft  Interaction Guarded  Motor Activity Slow  Appearance/Hygiene In scrubs  Behavior Characteristics Unwilling to participate;Anxious  Mood Suspicious  Thought Process  Coherency Unable to assess  Content Paranoia  Delusions UTA  Perception UTA  Hallucination UTA  Judgment Impaired  Confusion None  Danger to Self  Current suicidal ideation? Denies  Danger to Others  Danger to Others None reported or observed

## 2024-03-06 NOTE — Group Note (Signed)
 Recreation Therapy Group Note   Group Topic:Self-Esteem  Group Date: 03/06/2024 Start Time: 1040 End Time: 1115 Facilitators: Indira Sorenson-McCall, LRT,CTRS Location: [address] Day[address]   Group Topic/Focus: Self Expression   Goal Area(s) Addresses:  Patient will identify positive things about themselves. Patient will identify the importance of being self confidence.     Behavioral Response:   Intervention: Drawing  Activity : LRT discussed with patients the importance of self expression. Patients were then given a picture of a blank face, and told to illustrate and describe how they see and feel about themselves. Patients were given colored pencils, markers, and crayons to complete the assignment. Patients shared their completed assignment with each other.   Education: Self Expression, Discharge Planning  Educational Outcome: Acknowledges education   Affect/Mood: N/A   Participation Level: Did not attend    Clinical Observations/Individualized Feedback:      Plan: Continue to engage patient in RT group sessions 2-3x/week.   Sheyna Pettibone-McCall, LRT,CTRS 03/06/2024 1:27 PM

## 2024-03-07 ENCOUNTER — Encounter (HOSPITAL_COMMUNITY): Payer: Self-pay

## 2024-03-07 DIAGNOSIS — F4310 Post-traumatic stress disorder, unspecified: Secondary | ICD-10-CM | POA: Diagnosis not present

## 2024-03-07 DIAGNOSIS — F250 Schizoaffective disorder, bipolar type: Secondary | ICD-10-CM | POA: Diagnosis not present

## 2024-03-07 DIAGNOSIS — F411 Generalized anxiety disorder: Secondary | ICD-10-CM | POA: Diagnosis not present

## 2024-03-07 LAB — LIPID PANEL
Cholesterol: 324 mg/dL — ABNORMAL HIGH (ref 0–200)
HDL: 47 mg/dL (ref >40)
LDL Cholesterol: 235 mg/dL — ABNORMAL HIGH (ref 0–99)
Total CHOL/HDL Ratio: 6.9 ratio
Triglycerides: 212 mg/dL — ABNORMAL HIGH (ref <150)
VLDL: 42 mg/dL — ABNORMAL HIGH (ref 0–40)

## 2024-03-07 LAB — VITAMIN B12: Vitamin B-12: 1065 pg/mL — ABNORMAL HIGH (ref 180–914)

## 2024-03-07 LAB — RPR: RPR Ser Ql: NONREACTIVE

## 2024-03-07 LAB — TSH: TSH: 63 u[IU]/mL — ABNORMAL HIGH (ref 0.350–4.500)

## 2024-03-07 LAB — FOLATE: Folate: 18 ng/mL (ref >5.9)

## 2024-03-07 LAB — HIV ANTIBODY (ROUTINE TESTING W REFLEX): HIV Screen 4th Generation wRfx: NONREACTIVE

## 2024-03-07 LAB — T4, FREE: Free T4: <0.25 ng/dL — ABNORMAL LOW (ref 0.61–1.12)

## 2024-03-07 LAB — VITAMIN D 25 HYDROXY (VIT D DEFICIENCY, FRACTURES): Vit D, 25-Hydroxy: 15.4 ng/mL — ABNORMAL LOW (ref 30–100)

## 2024-03-07 MED ORDER — VITAMIN D (ERGOCALCIFEROL) 1.25 MG (50000 UNIT) PO CAPS
50000.0000 [IU] | ORAL_CAPSULE | Freq: Every day | ORAL | Status: AC
  Administered 2024-03-07 – 2024-03-20 (×14): 50000 [IU] via ORAL
  Filled 2024-03-07 (×14): qty 1

## 2024-03-07 MED ORDER — ATORVASTATIN CALCIUM 10 MG PO TABS
10.0000 mg | ORAL_TABLET | Freq: Every day | ORAL | Status: DC
  Administered 2024-03-07 – 2024-03-20 (×13): 10 mg via ORAL
  Filled 2024-03-07 (×13): qty 1
  Filled 2024-03-07: qty 7

## 2024-03-07 NOTE — Plan of Care (Signed)
  Problem: Education: Goal: Emotional status will improve Outcome: Progressing Goal: Mental status will improve Outcome: Progressing   Problem: Activity: Goal: Sleeping patterns will improve Outcome: Progressing   Problem: Safety: Goal: Periods of time without injury will increase Outcome: Progressing   Problem: Activity: Goal: Interest or engagement in activities will improve Outcome: Not Progressing

## 2024-03-07 NOTE — Progress Notes (Signed)
(  Sleep Hours) -11.25 (Any PRNs that were needed, meds refused, or side effects to meds)- took schdule medication (Any disturbances and when (visitation, over night)-none (Concerns raised by the patient)- none (SI/HI/AVH)-Denied  SI, but refused to answer other assessment questions

## 2024-03-07 NOTE — Group Note (Signed)
 Recreation Therapy Group Note   Group Topic:Health and Wellness  Group Date: 03/07/2024 Start Time: 1015 End Time: 1040 Facilitators: Chadley Dziedzic Linsday-McCall, LRT,CTRS Location: [address] Dayroom   Group Topic: Exercise/Wellness  Goal Area(s) Addresses:  Patient will actively participate in selected exercises.  Patient will verbalize benefit of exercise during group session. Patient will acknowledge benefits of exercise when used as a coping mechanism.   Behavioral Response:   Intervention: Music  Activity: Exercise. LRT and patients discussed the importance of physical exercise. Patients took turns leading the group in the exercises and stretches of their choosing. Patients were encouraged to do their best but not strain themselves. Patients were encouraged to take breaks or get water as needed.  Education: Physical Activity, Health and Wellness  Education Outcome: Acknowledges understanding/In group clarification offered/Needs additional education.    Affect/Mood: N/A   Participation Level: Did not attend    Clinical Observations/Individualized Feedback:      Plan: Continue to engage patient in RT group sessions 2-3x/week.   Quincy Prisco-McCall, LRT,CTRS 03/07/2024 12:57 PM

## 2024-03-07 NOTE — BH IP Treatment Plan (Signed)
 Interdisciplinary Treatment and Diagnostic Plan Update  03/07/2024 Time of Session: 11:15 AM Colleen Peck MRN: [id number]  Principal Diagnosis: Schizoaffective disorder (HCC)  Secondary Diagnoses: Principal Problem:   Schizoaffective disorder (HCC) Active Problems:   PTSD (post-traumatic stress disorder)   GAD (generalized anxiety disorder)   Current Medications:  Current Facility-Administered Medications  Medication Dose Route Frequency Provider Last Rate Last Admin   acetaminophen  (TYLENOL ) tablet 650 mg  650 mg Oral Q6H PRN Mills, Shnese E, NP       albuterol  (VENTOLIN  HFA) 108 (90 Base) MCG/ACT inhaler 2 puff  2 puff Inhalation Q4H PRN Mills, Shnese E, NP       alum & mag hydroxide-simeth (MAALOX/MYLANTA) 200-200-20 MG/5ML suspension 30 mL  30 mL Oral Q4H PRN Mills, Shnese E, NP       atorvastatin  (LIPITOR) tablet 10 mg  10 mg Oral QHS Parker, Alvin S, MD       haloperidol  (HALDOL ) tablet 5 mg  5 mg Oral TID PRN Moishe Bernadette BRAVO, NP       And   diphenhydrAMINE  (BENADRYL ) capsule 50 mg  50 mg Oral TID PRN Mills, Shnese E, NP       haloperidol  lactate (HALDOL ) injection 5 mg  5 mg Intramuscular TID PRN Mills, Shnese E, NP   5 mg at 03/06/24 0940   And   diphenhydrAMINE  (BENADRYL ) injection 50 mg  50 mg Intramuscular TID PRN Mills, Shnese E, NP   50 mg at 03/06/24 0940   And   LORazepam  (ATIVAN ) injection 2 mg  2 mg Intramuscular TID PRN Mills, Shnese E, NP   2 mg at 03/06/24 0941   haloperidol  lactate (HALDOL ) injection 10 mg  10 mg Intramuscular TID PRN Moishe Bernadette BRAVO, NP       And   diphenhydrAMINE  (BENADRYL ) injection 50 mg  50 mg Intramuscular TID PRN Moishe Bernadette BRAVO, NP       And   LORazepam  (ATIVAN ) injection 2 mg  2 mg Intramuscular TID PRN Mills, Shnese E, NP       haloperidol  (HALDOL ) tablet 5 mg  5 mg Oral BID Bennett, Christal H, NP   5 mg at 03/07/24 9161   hydrOXYzine  (ATARAX ) tablet 25 mg  25 mg Oral TID PRN Mills, Shnese E, NP       magnesium  hydroxide (MILK OF  MAGNESIA) suspension 30 mL  30 mL Oral Daily PRN Mills, Shnese E, NP       traZODone  (DESYREL ) tablet 50 mg  50 mg Oral QHS PRN Mills, Shnese E, NP       Vitamin D  (Ergocalciferol ) (DRISDOL ) 1.25 MG (50000 UNIT) capsule 50,000 Units  50,000 Units Oral Daily Parker, Alvin S, MD       PTA Medications: Medications Prior to Admission  Medication Sig Dispense Refill Last Dose/Taking   albuterol  (VENTOLIN  HFA) 108 (90 Base) MCG/ACT inhaler INHALE 2 PUFFS INTO THE LUNGS EVERY 4 HOURS AS NEEDED FOR WHEEZING OR SHORTNESS OF BREATH 18 g 1    lamoTRIgine  (LAMICTAL ) 25 MG tablet Take 25 mg by mouth daily.      levothyroxine  (SYNTHROID ) 200 MCG tablet Take 1 tablet (200 mcg total) by mouth daily at 6 (six) AM. (Patient not taking: Reported on 09/27/2023) 30 tablet 2    triamcinolone  ointment (KENALOG ) 0.1 % Apply topically 2 (two) times daily. (Patient not taking: Reported on 09/27/2023)       Patient Stressors: Other: She would not say    Patient Strengths: Other: UTA  Treatment Modalities: Medication Management, Group therapy, Case management,  1 to 1 session with clinician, Psychoeducation, Recreational therapy.   Physician Treatment Plan for Primary Diagnosis: Schizoaffective disorder (HCC) Long Term Goal(s): Improvement in symptoms so as ready for discharge   Short Term Goals: Ability to identify changes in lifestyle to reduce recurrence of condition will improve  Medication Management: Evaluate patient's response, side effects, and tolerance of medication regimen.  Therapeutic Interventions: 1 to 1 sessions, Unit Group sessions and Medication administration.  Evaluation of Outcomes: Not Progressing  Physician Treatment Plan for Secondary Diagnosis: Principal Problem:   Schizoaffective disorder (HCC) Active Problems:   PTSD (post-traumatic stress disorder)   GAD (generalized anxiety disorder)  Long Term Goal(s): Improvement in symptoms so as ready for discharge   Short Term Goals:  Ability to identify changes in lifestyle to reduce recurrence of condition will improve     Medication Management: Evaluate patient's response, side effects, and tolerance of medication regimen.  Therapeutic Interventions: 1 to 1 sessions, Unit Group sessions and Medication administration.  Evaluation of Outcomes: Not Progressing   RN Treatment Plan for Primary Diagnosis: Schizoaffective disorder (HCC) Long Term Goal(s): Knowledge of disease and therapeutic regimen to maintain health will improve  Short Term Goals: Ability to remain free from injury will improve, Ability to verbalize frustration and anger appropriately will improve, Ability to demonstrate self-control, Ability to participate in decision making will improve, Ability to verbalize feelings will improve, Ability to disclose and discuss suicidal ideas, Ability to identify and develop effective coping behaviors will improve, and Compliance with prescribed medications will improve  Medication Management: RN will administer medications as ordered by provider, will assess and evaluate patient's response and provide education to patient for prescribed medication. RN will report any adverse and/or side effects to prescribing provider.  Therapeutic Interventions: 1 on 1 counseling sessions, Psychoeducation, Medication administration, Evaluate responses to treatment, Monitor vital signs and CBGs as ordered, Perform/monitor CIWA, COWS, AIMS and Fall Risk screenings as ordered, Perform wound care treatments as ordered.  Evaluation of Outcomes: Not Progressing   LCSW Treatment Plan for Primary Diagnosis: Schizoaffective disorder (HCC) Long Term Goal(s): Safe transition to appropriate next level of care at discharge, Engage patient in therapeutic group addressing interpersonal concerns.  Short Term Goals: Engage patient in aftercare planning with referrals and resources, Increase social support, Increase ability to appropriately verbalize  feelings, Increase emotional regulation, Facilitate acceptance of mental health diagnosis and concerns, Facilitate patient progression through stages of change regarding substance use diagnoses and concerns, Identify triggers associated with mental health/substance abuse issues, and Increase skills for wellness and recovery  Therapeutic Interventions: Assess for all discharge needs, 1 to 1 time with Social worker, Explore available resources and support systems, Assess for adequacy in community support network, Educate family and significant other(s) on suicide prevention, Complete Psychosocial Assessment, Interpersonal group therapy.  Evaluation of Outcomes: Not Progressing   Progress in Treatment: Attending groups: No. Participating in groups: No. Taking medication as prescribed: Yes. Toleration medication: Yes. Family/Significant other contact made: consents are pending Patient understands diagnosis: No. Discussing patient identified problems/goals with staff: No. Medical problems stabilized or resolved: Yes. Denies suicidal/homicidal ideation: Yes. Issues/concerns per patient self-inventory: No.   New problem(s) identified:  No  New Short Term/Long Term Goal(s):    medication stabilization, elimination of SI thoughts, development of comprehensive mental wellness plan.    Patient Goals:  Patient had covers over her head and refused to participate in the meeting.    Discharge Plan or  Barriers:   Reason for Continuation of Hospitalization: Anxiety Medication stabilization Paranoia  Estimated Length of Stay:  5 - 7 days  Last 3 [place] Suicide Severity Risk Score: Flowsheet Row Ad[facility] (Current[facility] from 03/03/2024 in [facility] ED from 09/26/2023 in [facility] at [facility]  C-SSRS RISK CATEGORY No Risk No Risk No Risk    Last PHQ 2/9  Scores:    05/10/2021    6:25 PM 03/27/2020    2:12 PM 02/04/2020    1:40 PM  Depression screen PHQ 2/9  Decreased Interest 1 0 0  Down, Depressed, Hopeless 2 0 0  PHQ - 2 Score 3 0 0  Altered sleeping 1 0 1  Tired, decreased energy 1 0 1  Change in appetite 1 3 0  Feeling bad or failure about yourself  2 0 0  Trouble concentrating 2 3 0  Moving slowly or fidgety/restless 2 0 0  Suicidal thoughts 2 0 0  PHQ-9 Score 14 6 2   Difficult doing work/chores Very difficult      Scribe for Treatment Team: Martha Soltys O Jaiden Wahab, LCSWA 03/07/2024 8:08 PM

## 2024-03-07 NOTE — BHH Group Notes (Signed)
 The focus of this group is to help patients review their daily goal of treatment and discuss progress on daily workbooks.          Scale 1-10  N/a     What was your goal today and did you accomplish it? Did  not attend

## 2024-03-07 NOTE — Plan of Care (Signed)
  Problem: Activity: Goal: Interest or engagement in activities will improve Outcome: Progressing Goal: Sleeping patterns will improve Outcome: Progressing   Problem: Education: Goal: Knowledge of [facility] Education information/materials will improve Outcome: Progressing Goal: Mental status will improve Outcome: Progressing   Problem: Education: Goal: Knowledge of [facility] Education information/materials will improve Outcome: Progressing Goal: Mental status will improve Outcome: Progressing

## 2024-03-07 NOTE — Progress Notes (Signed)
(  Sleep Hours) -7.75 (Any PRNs that were needed, meds refused, or side effects to meds)- N/A  (Any disturbances and when (visitation, over night)-N/A  (Concerns raised by the patient)- pt continues to be paranoid and isolates to her room, pt did come to the medication window to take her HS medications, pt responded with one word answers and had very minimal verbal interaction, but responded when addressed.   (SI/HI/AVH)-denies

## 2024-03-07 NOTE — BHH Counselor (Signed)
 CSW attempted to meet with patient for completion of psychosocial assessment, however patient unable to verbalize responses and would only nod her head. CSW to attempt at a later time this afternoon.  Alonzo Loving, LCSWA 03/07/24 10:35am

## 2024-03-07 NOTE — Progress Notes (Signed)
 Recreation Therapy Notes  LRT attempted to complete recreation therapy assessment with patient. Patient lifted her head up off pillow and acknowledged LRT being in the room. LRT explained what the assessment was and its purpose. Patient could barely be understood. Patient would also just shrug her shoulders when asked a question. Patient eventually put her head back down on her pillow, closed her eyes and put her hands over her ears and appear to be getting tearful. LRT will make another attempt to complete assessment.     Hakan Nudelman-McCall, LRT,CTRS [place] A Shaneen Reeser-McCall 03/07/2024 3:09 PM
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
## 2024-03-07 NOTE — Progress Notes (Signed)
   03/07/24 0900  Psych Admission Type (Psych Patients Only)  Admission Status Involuntary  Psychosocial Assessment  Patient Complaints Isolation  Eye Contact None  Facial Expression Flat  Affect Flat  Speech Soft  Interaction Isolative  Motor Activity Slow  Appearance/Hygiene In scrubs  Behavior Characteristics Guarded;Calm  Mood Anxious;Suspicious  Thought Process  Coherency Unable to assess  Content Paranoia  Delusions UTA  Perception UTA  Hallucination UTA  Judgment Impaired  Confusion None  Danger to Self  Current suicidal ideation? Denies  Danger to Others  Danger to Others None reported or observed

## 2024-03-08 ENCOUNTER — Encounter (HOSPITAL_COMMUNITY): Payer: Self-pay | Admitting: Adult Health

## 2024-03-08 DIAGNOSIS — Z91148 Patient's other noncompliance with medication regimen for other reason: Secondary | ICD-10-CM

## 2024-03-08 DIAGNOSIS — F4310 Post-traumatic stress disorder, unspecified: Secondary | ICD-10-CM | POA: Diagnosis not present

## 2024-03-08 DIAGNOSIS — F411 Generalized anxiety disorder: Secondary | ICD-10-CM | POA: Diagnosis not present

## 2024-03-08 DIAGNOSIS — F259 Schizoaffective disorder, unspecified: Secondary | ICD-10-CM

## 2024-03-08 HISTORY — DX: Patient's other noncompliance with medication regimen for other reason: Z91.148

## 2024-03-08 LAB — T3: T3, Total: <20 ng/dL — ABNORMAL LOW (ref 71–180)

## 2024-03-08 LAB — HEMOGLOBIN A1C
Hgb A1c MFr Bld: 5.5 % (ref 4.8–5.6)
Mean Plasma Glucose: 111 mg/dL

## 2024-03-08 MED ORDER — LEVOTHYROXINE SODIUM 100 MCG PO TABS
200.0000 ug | ORAL_TABLET | Freq: Every day | ORAL | Status: DC
  Administered 2024-03-08 – 2024-03-20 (×13): 200 ug via ORAL
  Filled 2024-03-08 (×3): qty 2
  Filled 2024-03-08: qty 14
  Filled 2024-03-08 (×11): qty 2

## 2024-03-08 NOTE — Progress Notes (Signed)
 CSW received legal guardianship documentation from Secondary school teacher. Legal guardian appointed: Colleen Peck. CSW filed in patient's hard chart.   Jazminn Pomales, LCSWA 03/08/24 8:45am

## 2024-03-08 NOTE — Progress Notes (Signed)
 Recreation Therapy Notes  INPATIENT RECREATION THERAPY ASSESSMENT  Patient Details Name: Colleen Peck MRN: [id number] DOB: 27-Oct-1980 Today's Date: 03/08/2024       Information Obtained From: Chart Review  Able to Participate in Assessment/Interview:  (Patient not engaging.)  Patient Presentation:  (Guarded)  Reason for Admission (Per Patient):  (Paranoia)  Patient Stressors:  (Not taking medications)  Coping Skills:    (UTA)  Leisure Interests (2+):  Technical brewer - Other (Comment), Community - Other (Comment) (being outside, resturants)  Frequency of Recreation/Participation:  (UTA)  County of Residence:  [place]  Patient Main Form of Transportation:  (UTA)  Patient Strengths:  UTA  Patient Identified Areas of Improvement:  UTA  Patient Goal for Hospitalization:  UTA  Current SI (including self-harm):   (UTA)  Current HI:   (UTA)  Current AVH:  (UTA)  Staff Intervention Plan: Group Attendance, Collaborate with Interdisciplinary Treatment Team  Consent to Intern Participation: N/A   Lateshia Schmoker-McCall, LRT,CTRS Kristinia Leavy A Graceland Wachter-McCall 03/08/2024, 1:19 PM

## 2024-03-08 NOTE — Group Note (Signed)
 Recreation Therapy Group Note   Group Topic:Healthy Decision Making  Group Date: 03/08/2024 Start Time: 1005 End Time: 1025 Facilitators: Colleen Peck, LRT,CTRS Location: [address] Dayroom   Group Topic: Decision Making, Problem Solving, Communication  Goal Area(s) Addresses:  Patient will effectively work with peer towards shared goal.  Patient will identify factors that guided their decision making.  Patient will pro-socially communicate ideas during group session.   Behavioral Response:   Intervention: Survival Scenario - pencil, paper  Activity: Patients were given a scenario that they were going to be stranded on a deserted [place] for several months before being rescued. Writer tasked them with making a list of 15 things they would choose to bring with them for survival. The list of items was prioritized most important to least. Each patient would come up with their own list, then work together to create a new list of 15 items while in a group of 3-5 peers. LRT discussed each person's list and how it differed from others. The debrief included discussion of priorities, good decisions versus bad decisions, and how it is important to think before acting so we can make the best decision possible. LRT tied the concept of effective communication among group members to patient's support systems outside of the hospital and its benefit post discharge.  Education: Pharmacist, community, Priorities, Support System, Discharge Planning   Education Outcome: Acknowledges education/In group clarification/Needs additional education   Affect/Mood: N/A   Participation Level: Did not attend    Clinical Observations/Individualized Feedback:      Plan: Continue to engage patient in RT group sessions 2-3x/week.   Colleen Peck, LRT,CTRS 03/08/2024 12:26 PM

## 2024-03-08 NOTE — Group Note (Signed)
 Date:  03/08/2024 Time:  3:42 PM  Group Topic/Focus:  Goals Group:   The focus of this group is to help patients establish daily goals to achieve during treatment and discuss how the patient can incorporate goal setting into their daily lives to aide in recovery. Orientation:   The focus of this group is to educate the patient on the purpose and policies of crisis stabilization and provide a format to answer questions about their admission.  The group details unit policies and expectations of patients while admitted.    Participation Level:  Did Not Attend    Additional Comments:  Did not attend  Logan LITTIE Molly 03/08/2024, 3:42 PM
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
## 2024-03-08 NOTE — Plan of Care (Signed)
   Problem: Education: Goal: Emotional status will improve Outcome: Not Progressing Goal: Mental status will improve Outcome: Not Progressing

## 2024-03-08 NOTE — Progress Notes (Signed)
 Tour of Duty:  Prentice JINNY Angle, RN, 03/08/24, Tour of Duty: 0700-1900  SI/HI/AVH: Denies  Self-Reported   Mood: Negative  Anxiety: Denies Depression: Denies Irritability: Denies  Broset  Violence Prevention Guidelines *See Row Information*: (not recorded)   LBM  Last BM Date : (not recorded)   Pain: not present  Patient Refusals (including Rx): No  Shift Summary: Patient observed to be calm on unit. Patient able to make needs known. Patient observed to engage appropriately with staff and peers. Patient taking medications as prescribed. This shift, no PRN medication requested or required. No observed or reported side effects to medication. No observed or reported agitation, aggression, or other acute emotional distress. No observed or reported physical abnormalities or concerns.   Last Vitals  Vitals Weight: 97.5 kg Temp: 98.1 F (36.7 C) Temp Source: Oral Pulse Rate: 70 Resp: 18 BP: 94/71 Patient Position: (not recorded)  Admission Type  Psych Admission Type (Psych Patients Only) Admission Status: Involuntary Date 72 hour document signed : (not recorded) Time 72 hour document signed : (not recorded) Provider Notified (First and Last Name) (see details for LINK to note): (not recorded)   Psychosocial Assessment  Psychosocial Assessment Patient Complaints: None Eye Contact: Avoids Facial Expression: Flat Affect: Flat Speech: Other (Comment) (paucity) Interaction: Isolative, Avoidant Motor Activity: Lethargic Appearance/Hygiene: Unremarkable Behavior Characteristics: Guarded, Resistant to care Mood: Suspicious   Aggressive Behavior  Targets: (not recorded)   Thought Process  Thought Process Coherency: Blocking Content: Preoccupation Delusions: None reported or observed Perception: Within Defined Limits Hallucination: None reported or observed Judgment: Poor Confusion: Mild  Danger to Self/Others  Danger to Self Current suicidal ideation?:  Denies Description of Suicide Plan: (not recorded) Self-Injurious Behavior: (not recorded) Agreement Not to Harm Self: (not recorded) Description of Agreement: (not recorded) Danger to Others: None reported or observed

## 2024-03-08 NOTE — [facility] Group Notes (Signed)
 Adult Psychoeducational Group Note  Date:  03/08/2024 Time:  9:12 PM  Group Topic/Focus:  Wrap-Up Group:   The focus of this group is to help patients review their daily goal of treatment and discuss progress on daily workbooks.  Participation Level:  Did Not Attend  Participation Quality:    Affect:    Cognitive:    Insight:   Engagement in Group:    Modes of Intervention:    Additional Comments:  Pt did not attend group.  Drue Pouch 03/08/2024, 9:12 PM
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
## 2024-03-09 DIAGNOSIS — F4310 Post-traumatic stress disorder, unspecified: Secondary | ICD-10-CM | POA: Diagnosis not present

## 2024-03-09 DIAGNOSIS — F259 Schizoaffective disorder, unspecified: Secondary | ICD-10-CM | POA: Diagnosis not present

## 2024-03-09 DIAGNOSIS — F411 Generalized anxiety disorder: Secondary | ICD-10-CM | POA: Diagnosis not present

## 2024-03-09 NOTE — Plan of Care (Signed)
°  Problem: Education: °Goal: Emotional status will improve °Outcome: Progressing °  °Problem: Activity: °Goal: Sleeping patterns will improve °Outcome: Progressing °  °Problem: Safety: °Goal: Periods of time without injury will increase °Outcome: Progressing °  °

## 2024-03-09 NOTE — BHH Group Notes (Signed)
 Adult Psychoeducational Group Note  Date:  03/09/2024 Time:  8:46 PM  Group Topic/Focus:  Wrap-Up Group:   The focus of this group is to help patients review their daily goal of treatment and discuss progress on daily workbooks.  Participation Level:  Did Not Attend  Aisha Celestine Ruth 03/09/2024, 8:46 PM

## 2024-03-09 NOTE — Progress Notes (Signed)
(  Sleep Hours) -8.75  (Any PRNs that were needed, meds refused, or side effects to meds)- Trazodone  50 mg , Vistaril  25 mg  (Any disturbances and when (visitation, over night)-N/A  (Concerns raised by the patient)- pt continues to be paranoid and suspicious, although pt appears tolerant of Clinical research associate. Pt encouraged again to talk more to the doctor . Pt appears to have trust issues.   (SI/HI/AVH)-N/A

## 2024-03-09 NOTE — Care Management Important Message (Signed)
 Patient refused to sign the blank, unsigned Medicare IM document, because she had already given verbal consent.

## 2024-03-09 NOTE — Group Note (Signed)
 Date:  03/09/2024 Time:  5:09 PM  Group Topic/Focus:  Goals Group:   The focus of this group is to help patients establish daily goals to achieve during treatment and discuss how the patient can incorporate goal setting into their daily lives to aide in recovery. Orientation:   The focus of this group is to educate the patient on the purpose and policies of crisis stabilization and provide a format to answer questions about their admission.  The group details unit policies and expectations of patients while admitted.    Participation Level:  Did Not Attend  Logan LITTIE Molly 03/09/2024, 5:09 PM

## 2024-03-09 NOTE — Group Note (Signed)
 Recreation Therapy Group Note   Group Topic:Problem Solving  Group Date: 03/09/2024 Start Time: 1015 End Time: 1035 Facilitators: Ivori Storr-McCall, LRT,CTRS Location: [address] Dayroom   Group Topic: Communication, Team Building, Problem Solving  Goal Area(s) Addresses:  Patient will effectively work with peer towards shared goal.  Patient will identify skills used to make activity successful.  Patient will identify how skills used during activity can be used to reach post d/c goals.   Behavioral Response:   Intervention: STEM Activity  Activity: Straw Bridge. In teams of 3-5, patients were given 15 plastic drinking straws and an equal length of masking tape. Using the materials provided, patients were instructed to build a free standing bridge-like structure to suspend an everyday item (ex: puzzle box) off of the floor or table surface. All materials were required to be used by the team in their design. LRT facilitated post-activity discussion reviewing team process. Patients were encouraged to reflect how the skills used in this activity can be generalized to daily life post discharge.   Education: Pharmacist, community, Scientist, physiological, Discharge Planning   Education Outcome: Acknowledges education/In group clarification offered/Needs additional education.    Affect/Mood: N/A   Participation Level: Did not attend    Clinical Observations/Individualized Feedback:      Plan: Continue to engage patient in RT group sessions 2-3x/week.   Clotiel Troop-McCall, LRT,CTRS 03/09/2024 1:03 PM

## 2024-03-09 NOTE — Plan of Care (Signed)
  Problem: Education: Goal: Emotional status will improve Outcome: Progressing Goal: Mental status will improve Outcome: Progressing   Problem: Activity: Goal: Sleeping patterns will improve Outcome: Progressing   Problem: Safety: Goal: Periods of time without injury will increase Outcome: Progressing   Problem: Activity: Goal: Interest or engagement in activities will improve Outcome: Not Progressing

## 2024-03-09 NOTE — Progress Notes (Signed)
(  Sleep Hours) -8.5 (Any PRNs that were needed, meds refused, or side effects to meds)- Trazodone  50 mg , Vistaril  25 mg  (Any disturbances and when (visitation, over night)-N/A  (Concerns raised by the patient)- N/A  (SI/HI/AVH)-denies

## 2024-03-09 NOTE — Care Management Important Message (Signed)
 Medicare IM printed and given to social work to give to the patient. ?
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
## 2024-03-10 DIAGNOSIS — F4310 Post-traumatic stress disorder, unspecified: Secondary | ICD-10-CM | POA: Diagnosis not present

## 2024-03-10 DIAGNOSIS — F250 Schizoaffective disorder, bipolar type: Secondary | ICD-10-CM | POA: Diagnosis not present

## 2024-03-10 DIAGNOSIS — F411 Generalized anxiety disorder: Secondary | ICD-10-CM | POA: Diagnosis not present

## 2024-03-10 NOTE — Group Note (Signed)
 Date:  03/10/2024 Time:  8:32 PM  Group Topic/Focus:  Wrap-Up Group:   The focus of this group is to help patients review their daily goal of treatment and discuss progress on daily workbooks.    Additional Comments:   Pt was encouraged, but opted out of attending wrap up group.   Colleen Peck 03/10/2024, 8:32 PM

## 2024-03-10 NOTE — Group Note (Signed)
 [facility] LCSW Group Therapy Note   Group Date: 03/10/2024 Start Time: 1515 End Time: 1614   Type of Therapy/Topic:  Group Therapy:  Balancing Life Stressors with Support.   Participation Level:  Active   Description of Group:    This group will address the concept of balance and how it feels and looks when one is unbalanced. Patients will be encouraged to process areas in their lives that are out of balance, and identify reasons for remaining unbalanced. Facilitators will guide patients utilizing problem- solving interventions to address and correct the stressor making their life unbalanced. Understanding and applying boundaries will be explored and addressed for obtaining  and maintaining a balanced life. Patients will be encouraged to explore ways to assertively make their unbalanced needs known to significant others in their lives, using other group members and facilitator for support and feedback.  Therapeutic Goals: Patient will identify two or more emotions or situations they have that consume much of in their lives. Patient will identify signs/triggers that life has become out of balance:  Patient will identify two ways to set boundaries in order to achieve balance in their lives:  Patient will demonstrate ability to communicate their needs through discussion and/or role plays.  Summary of Patient Progress: Patient shared lack of support and challenges to directions without input.  Patient appeared unable to actively communicate with prompts however demonstrated engagement while in session.   Therapeutic Modalities:   Cognitive Behavioral Therapy Solution-Focused Therapy Assertiveness Training   [place], LCSWA
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
## 2024-03-11 DIAGNOSIS — F411 Generalized anxiety disorder: Secondary | ICD-10-CM | POA: Diagnosis not present

## 2024-03-11 DIAGNOSIS — F4310 Post-traumatic stress disorder, unspecified: Secondary | ICD-10-CM | POA: Diagnosis not present

## 2024-03-11 DIAGNOSIS — F259 Schizoaffective disorder, unspecified: Secondary | ICD-10-CM | POA: Diagnosis not present

## 2024-03-11 MED ORDER — DOCUSATE SODIUM 100 MG PO CAPS
100.0000 mg | ORAL_CAPSULE | Freq: Two times a day (BID) | ORAL | Status: DC
  Administered 2024-03-11 – 2024-03-15 (×3): 100 mg via ORAL
  Filled 2024-03-11 (×8): qty 1

## 2024-03-11 NOTE — Plan of Care (Addendum)
(  Sleep Hours) - 7 (Any PRNs that were needed, meds refused, or side effects to meds)- Refused Lipitor took hydroxyzine  (Any disturbances and when (visitation, over night)- (Concerns raised by the patient)- none (SI/HI/AVH)- denies all
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
## 2024-03-11 NOTE — Progress Notes (Signed)
   03/11/24 0850  Psych Admission Type (Psych Patients Only)  Admission Status Involuntary  Psychosocial Assessment  Patient Complaints Worrying  Eye Contact Fair  Facial Expression Flat  Affect Preoccupied;Anxious  Speech Logical/coherent  Interaction Minimal  Motor Activity Other (Comment) (WDL)  Appearance/Hygiene In scrubs  Behavior Characteristics Guarded  Mood Suspicious;Preoccupied  Thought Process  Coherency WDL  Content Preoccupation  Delusions None reported or observed  Perception WDL  Hallucination None reported or observed  Judgment Poor  Confusion None  Danger to Self  Current suicidal ideation? Denies  Danger to Others  Danger to Others None reported or observed

## 2024-03-11 NOTE — Group Note (Signed)
 Date:  03/11/2024 Time:  8:27 PM  Group Topic/Focus:  Wrap-Up Group:   The focus of this group is to help patients review their daily goal of treatment and discuss progress on daily workbooks.    Participation Level:  Did Not Attend   Reannah Totten Dacosta 03/11/2024, 8:27 PM

## 2024-03-11 NOTE — Plan of Care (Signed)
   Problem: Education: Goal: Emotional status will improve Outcome: Progressing Goal: Mental status will improve Outcome: Progressing Goal: Verbalization of understanding the information provided will improve Outcome: Progressing

## 2024-03-12 ENCOUNTER — Encounter (HOSPITAL_COMMUNITY): Payer: Self-pay

## 2024-03-12 DIAGNOSIS — F259 Schizoaffective disorder, unspecified: Secondary | ICD-10-CM | POA: Diagnosis not present

## 2024-03-12 DIAGNOSIS — F411 Generalized anxiety disorder: Secondary | ICD-10-CM | POA: Diagnosis not present

## 2024-03-12 DIAGNOSIS — F4310 Post-traumatic stress disorder, unspecified: Secondary | ICD-10-CM | POA: Diagnosis not present

## 2024-03-12 MED ORDER — ENSURE PLUS HIGH PROTEIN PO LIQD
237.0000 mL | Freq: Two times a day (BID) | ORAL | Status: DC
  Administered 2024-03-14 – 2024-03-17 (×7): 237 mL via ORAL
  Filled 2024-03-12 (×20): qty 237
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
## 2024-03-12 NOTE — Progress Notes (Signed)
   03/12/24 1000  Psych Admission Type (Psych Patients Only)  Admission Status Involuntary  Psychosocial Assessment  Patient Complaints Anxiety;Worrying  Eye Contact Fair  Facial Expression Anxious;Flat  Affect Anxious;Preoccupied  Speech Logical/coherent  Interaction Minimal  Motor Activity Slow  Appearance/Hygiene Disheveled  Behavior Characteristics Cooperative;Anxious  Mood Anxious;Preoccupied  Thought Process  Coherency WDL  Content Preoccupation  Delusions None reported or observed  Perception WDL  Hallucination None reported or observed  Judgment Limited  Confusion None  Danger to Self  Current suicidal ideation? Denies  Danger to Others  Danger to Others None reported or observed

## 2024-03-12 NOTE — Group Note (Signed)
 LCSW Group Therapy Note   Group Date: 03/12/2024 Start Time: 1300 End Time: 1400   Participation:  did not attend  Type of Therapy:  Group Therapy  Topic:  Shining from Within:  Confidence and Self-Love Journey  Objective:  To support participants in developing confidence and self-love through self-awareness, self-compassion, and practical skills that nurture personal growth.   Group Goals Encourage self-reflection and self-acceptance by identifying personal strengths and achievements. Teach skills to challenge negative self-talk and replace it with supportive, truthful self-talk. Foster resilience and self-worth through [employer], gratitude, and self-care practices.   Summary:  This group explores the connection between confidence and self-love by guiding participants through reflection, mindset shifts, and practical tools like affirmations, strength recognition, and goal-setting. Activities are designed to promote self-compassion, build emotional resilience, and normalize the slow, patient journey of inner growth.   Therapeutic Modalities Used Cognitive Behavioral Therapy (CBT): Challenging and reframing unhelpful self-talk. Motivational Interviewing (MI): Encouraging small, achievable goals. Elements of Dialectical Behavioral Therapist (DBT):  Mindfulness and Self-Compassion: Promoting present-moment awareness and kindness toward self.   Loretta Doutt O Kleigh Hoelzer, LCSWA 03/12/2024  7:21 PM

## 2024-03-12 NOTE — Plan of Care (Signed)
  Problem: Education: Goal: Emotional status will improve Outcome: Progressing Goal: Verbalization of understanding the information provided will improve Outcome: Progressing   Problem: Coping: Goal: Ability to demonstrate self-control will improve Outcome: Progressing   Problem: Physical Regulation: Goal: Ability to maintain clinical measurements within normal limits will improve Outcome: Progressing

## 2024-03-12 NOTE — Group Note (Signed)
 Recreation Therapy Group Note   Group Topic:Leisure Education  Group Date: 03/12/2024 Start Time: 1025 End Time: 1055 Facilitators: Kelsy Polack-McCall, LRT,CTRS Location: [address] Day[address]   Group Topic: Leisure Education   Goal Area(s) Addresses:  Patient will successfully identify positive leisure and recreation activities.  Patient will acknowledge benefits of participation in healthy leisure activities post discharge.  Patient will actively work with peers toward a shared goal.   Behavioral Response:    Intervention: Competitive Group Game   Activity: Guess the [employer]. Patients were to use the spinner to land on a category ([address], Hip Hop, Indie, Dance, R&B and Pop). Whatever category the patient landed on, LRT would read the lyric and patient had to guess the missing word from the lyric. It patient didn't guess the correct answer, everyone else got a chance to steal the point. Whoever guessed the correct answer, got the point. The person with the most cards at the end of the game was the winner.   Education:  Teacher, English as a foreign language, Stress Management, Discharge Planning  Education Outcome: Acknowledges education/In group clarification offered/Needs additional education   Affect/Mood: Flat   Participation Level: Moderate   Participation Quality: Independent   Behavior: Lethargic   Speech/Thought Process: Loose association   Insight: Limited   Judgement: Limited   Modes of Intervention: Competitive Play   Patient Response to Interventions:  Receptive   Education Outcome:  In group clarification offered    Clinical Observations/Individualized Feedback: Pt appeared confused and stated many times during activity I don't know what this means or I don't understand. Pt expressed she wouldn't know any of the answers in the activity. Pt was eventually able to answer some of the questions but still came off as if she didn't know what she was doing and confused.       Plan: Continue to engage patient in RT group sessions 2-3x/week.   Colleen Peck, LRT,CTRS 03/12/2024 1:03 PM

## 2024-03-12 NOTE — B[facility] Group Notes (Signed)
 Adult Psychoeducational Group Note  Date:  03/12/2024 Time:  9:24 PM  Group Topic/Focus:  Wrap-Up Group:   The focus of this group is to help patients review their daily goal of treatment and discuss progress on daily workbooks.  Participation Level:  Did Not Attend  Participation Quality:    Affect:    Cognitive:    Insight:   Engagement in Group:    Modes of Intervention:    Additional Comments:  Pt did not attend group.   Drue Pouch 03/12/2024, 9:24 PM

## 2024-03-12 NOTE — BH IP Treatment Plan (Signed)
 Interdisciplinary Treatment and Diagnostic Plan Update  03/12/2024 Time of Session: 11:30AM UPDATE Colleen Peck MRN: [id number]  Principal Diagnosis: Schizoaffective disorder [facility])  Secondary Diagnoses: Principal Problem:   Schizoaffective disorder (HCC) Active Problems:   PTSD (post-traumatic stress disorder)   GAD (generalized anxiety disorder)   Current Medications:  Current Facility-Administered Medications  Medication Dose Route Frequency Provider Last Rate Last Admin   acetaminophen  (TYLENOL ) tablet 650 mg  650 mg Oral Q6H PRN Mills, Shnese E, NP       albuterol  (VENTOLIN  HFA) 108 (90 Base) MCG/ACT inhaler 2 puff  2 puff Inhalation Q4H PRN Mills, Shnese E, NP       alum & mag hydroxide-simeth (MAALOX/MYLANTA) 200-200-20 MG/5ML suspension 30 mL  30 mL Oral Q4H PRN Mills, Shnese E, NP       atorvastatin  (LIPITOR) tablet 10 mg  10 mg Oral QHS Parker, Alvin S, MD   10 mg at 03/11/24 2006   haloperidol  (HALDOL ) tablet 5 mg  5 mg Oral TID PRN Moishe Bernadette BRAVO, NP       And   diphenhydrAMINE  (BENADRYL ) capsule 50 mg  50 mg Oral TID PRN Mills, Shnese E, NP       haloperidol  lactate (HALDOL ) injection 5 mg  5 mg Intramuscular TID PRN Mills, Shnese E, NP   5 mg at 03/06/24 0940   And   diphenhydrAMINE  (BENADRYL ) injection 50 mg  50 mg Intramuscular TID PRN Mills, Shnese E, NP   50 mg at 03/06/24 0940   And   LORazepam  (ATIVAN ) injection 2 mg  2 mg Intramuscular TID PRN Mills, Shnese E, NP   2 mg at 03/06/24 0941   haloperidol  lactate (HALDOL ) injection 10 mg  10 mg Intramuscular TID PRN Moishe Bernadette BRAVO, NP       And   diphenhydrAMINE  (BENADRYL ) injection 50 mg  50 mg Intramuscular TID PRN Moishe Bernadette BRAVO, NP       And   LORazepam  (ATIVAN ) injection 2 mg  2 mg Intramuscular TID PRN Mills, Shnese E, NP       docusate sodium  (COLACE) capsule 100 mg  100 mg Oral BID Parker, Alvin S, MD   100 mg at 03/11/24 9076   feeding supplement (ENSURE PLUS HIGH PROTEIN) liquid 237 mL  237 mL Oral BID  BM Pashayan, Alexander S, DO       haloperidol  (HALDOL ) tablet 5 mg  5 mg Oral BID Bennett, Christal H, NP   5 mg at 03/12/24 9182   hydrOXYzine  (ATARAX ) tablet 25 mg  25 mg Oral TID PRN Mills, Shnese E, NP   25 mg at 03/12/24 9146   levothyroxine  (SYNTHROID ) tablet 200 mcg  200 mcg Oral Q0600 Parker, Alvin S, MD   200 mcg at 03/12/24 0546   magnesium  hydroxide (MILK OF MAGNESIA) suspension 30 mL  30 mL Oral Daily PRN Mills, Shnese E, NP       Vitamin D  (Ergocalciferol ) (DRISDOL ) 1.25 MG (50000 UNIT) capsule 50,000 Units  50,000 Units Oral Daily Parker, Alvin S, MD   50,000 Units at 03/12/24 0818   PTA Medications: Medications Prior to Admission  Medication Sig Dispense Refill Last Dose/Taking   albuterol  (VENTOLIN  HFA) 108 (90 Base) MCG/ACT inhaler INHALE 2 PUFFS INTO THE LUNGS EVERY 4 HOURS AS NEEDED FOR WHEEZING OR SHORTNESS OF BREATH 18 g 1    lamoTRIgine  (LAMICTAL ) 25 MG tablet Take 25 mg by mouth daily.      levothyroxine  (SYNTHROID ) 200 MCG tablet Take 1 tablet (  200 mcg total) by mouth daily at 6 (six) AM. (Patient not taking: Reported on 09/27/2023) 30 tablet 2    triamcinolone  ointment (KENALOG ) 0.1 % Apply topically 2 (two) times daily. (Patient not taking: Reported on 09/27/2023)       Patient Stressors: Other: She would not say    Patient Strengths: Other: UTA  Treatment Modalities: Medication Management, Group therapy, Case management,  1 to 1 session with clinician, Psychoeducation, Recreational therapy.   Physician Treatment Plan for Primary Diagnosis: Schizoaffective disorder (HCC) Long Term Goal(s): Improvement in symptoms so as ready for discharge   Short Term Goals: Ability to identify changes in lifestyle to reduce recurrence of condition will improve  Medication Management: Evaluate patient's response, side effects, and tolerance of medication regimen.  Therapeutic Interventions: 1 to 1 sessions, Unit Group sessions and Medication administration.  Evaluation of  Outcomes: Progressing  Physician Treatment Plan for Secondary Diagnosis: Principal Problem:   Schizoaffective disorder (HCC) Active Problems:   PTSD (post-traumatic stress disorder)   GAD (generalized anxiety disorder)  Long Term Goal(s): Improvement in symptoms so as ready for discharge   Short Term Goals: Ability to identify changes in lifestyle to reduce recurrence of condition will improve     Medication Management: Evaluate patient's response, side effects, and tolerance of medication regimen.  Therapeutic Interventions: 1 to 1 sessions, Unit Group sessions and Medication administration.  Evaluation of Outcomes: Progressing   RN Treatment Plan for Primary Diagnosis: Schizoaffective disorder (HCC) Long Term Goal(s): Knowledge of disease and therapeutic regimen to maintain health will improve  Short Term Goals: Ability to remain free from injury will improve, Ability to verbalize frustration and anger appropriately will improve, Ability to demonstrate self-control, Ability to participate in decision making will improve, Ability to verbalize feelings will improve, and Ability to disclose and discuss suicidal ideas  Medication Management: RN will administer medications as ordered by provider, will assess and evaluate patient's response and provide education to patient for prescribed medication. RN will report any adverse and/or side effects to prescribing provider.  Therapeutic Interventions: 1 on 1 counseling sessions, Psychoeducation, Medication administration, Evaluate responses to treatment, Monitor vital signs and CBGs as ordered, Perform/monitor CIWA, COWS, AIMS and Fall Risk screenings as ordered, Perform wound care treatments as ordered.  Evaluation of Outcomes: Progressing   LCSW Treatment Plan for Primary Diagnosis: Schizoaffective disorder (HCC) Long Term Goal(s): Safe transition to appropriate next level of care at discharge, Engage patient in therapeutic group addressing  interpersonal concerns.  Short Term Goals: Engage patient in aftercare planning with referrals and resources, Increase social support, Increase ability to appropriately verbalize feelings, Increase emotional regulation, Facilitate patient progression through stages of change regarding substance use diagnoses and concerns, and Identify triggers associated with mental health/substance abuse issues  Therapeutic Interventions: Assess for all discharge needs, 1 to 1 time with Social worker, Explore available resources and support systems, Assess for adequacy in community support network, Educate family and significant other(s) on suicide prevention, Complete Psychosocial Assessment, Interpersonal group therapy.  Evaluation of Outcomes: Progressing   Progress in Treatment: Attending groups: No. Participating in groups: No. Taking medication as prescribed: Yes. Toleration medication: Yes. Family/Significant other contact made: consents are pending Patient understands diagnosis: No. Discussing patient identified problems/goals with staff: No. Medical problems stabilized or resolved: Yes. Denies suicidal/homicidal ideation: Yes. Issues/concerns per patient self-inventory: No.     New problem(s) identified:  No   New Short Term/Long Term Goal(s):     medication stabilization, elimination of SI thoughts, development  of comprehensive mental wellness plan.      Patient Goals:  Patient had covers over her head and refused to participate in the meeting.     Discharge Plan or Barriers:    Reason for Continuation of Hospitalization: Anxiety Medication stabilization Paranoia   Estimated Length of Stay:  5-7  days    Reason for Continuation of Hospitalization: Medication stabilization   Last 3 [place] Suicide Severity Risk Score: Flowsheet Row Ad[facility] (Current) [facility] from 03/03/[facility]24 in [facility] ED from 09/26/2023 in [facility] Department at [facility]  C-SSRS RISK CATEGORY No Risk No Risk No Risk    Last PHQ 2/9 Scores:    05/10/2021    6:25 PM 03/27/2020    2:12 PM 02/04/2020    1:40 PM  Depression screen PHQ 2/9  Decreased Interest 1 0 0  Down, Depressed, Hopeless 2 0 0  PHQ - 2 Score 3 0 0  Altered sleeping 1 0 1  Tired, decreased energy 1 0 1  Change in appetite 1 3 0  Feeling bad or failure about yourself  2 0 0  Trouble concentrating 2 3 0  Moving slowly or fidgety/restless 2 0 0  Suicidal thoughts 2 0 0  PHQ-9 Score 14 6 2   Difficult doing work/chores Very difficult      Scribe for Treatment Team: Colleen Peck, Colleen Peck 03/12/2024 2:53 PM

## 2024-03-12 NOTE — Plan of Care (Signed)

## 2024-03-12 NOTE — Progress Notes (Signed)
(  Sleep Hours) - 9.75 (Any PRNs that were needed, meds refused, or side effects to meds)- none (Any disturbances and when (visitation, over night) (Concerns raised by the patient)- none  (SI/HI/AVH)- Denies

## 2024-03-12 NOTE — Plan of Care (Signed)
   Problem: Education: Goal: Mental status will improve Outcome: Progressing Goal: Verbalization of understanding the information provided will improve Outcome: Progressing   Problem: Activity: Goal: Interest or engagement in activities will improve Outcome: Progressing

## 2024-03-13 ENCOUNTER — Other Ambulatory Visit (HOSPITAL_COMMUNITY): Payer: Self-pay

## 2024-03-13 DIAGNOSIS — F259 Schizoaffective disorder, unspecified: Secondary | ICD-10-CM | POA: Diagnosis not present

## 2024-03-13 DIAGNOSIS — F411 Generalized anxiety disorder: Secondary | ICD-10-CM | POA: Diagnosis not present

## 2024-03-13 DIAGNOSIS — F4310 Post-traumatic stress disorder, unspecified: Secondary | ICD-10-CM | POA: Diagnosis not present

## 2024-03-13 NOTE — Progress Notes (Signed)
 Colleen Peck   Type of Note: IVC COURT HEARING  This Clinical research associate, Devan LCSWA and patient were present for virtual IVC hearing. Pt's mother/LG was also present for hearing. Corlis granted [facility] an additional 7 days.   Signed:  Athel Merriweather, LCSW-A 03/13/2024  2:34 PM

## 2024-03-13 NOTE — Plan of Care (Signed)
  Problem: Education: Goal: Emotional status will improve Outcome: Progressing Goal: Verbalization of understanding the information provided will improve Outcome: Progressing   Problem: Activity: Goal: Interest or engagement in activities will improve Outcome: Progressing   Problem: Health Behavior/Discharge Planning: Goal: Identification of resources available to assist in meeting health care needs will improve Outcome: Progressing
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
## 2024-03-13 NOTE — Progress Notes (Signed)
(  Sleep Hours) - 12.75  (Any PRNs that were needed, meds refused, or side effects to meds)- Atarax  at HS for anxiety - Effective  (Any disturbances and when (visitation, over night)-   (Concerns raised by the patient)- None  (SI/HI/AVH)- Denies

## 2024-03-13 NOTE — Group Note (Signed)
 Recreation Therapy Group Note   Group Topic:Coping Skills  Group Date: 03/13/2024 Start Time: 1035 End Time: 1100 Facilitators: Suraj Ramdass-McCall, LRT,CTRS Location: [address] Hall Dayroom   Group Topic: Coping Skills   Goal Area(s) Addresses: Patient will define what a coping skill is. Patient will create a list of healthy coping skills beginning with each letter of the alphabet. Patient will successfully identify positive coping skills they can use post d/c.  Patient will acknowledge benefit(s) of using learned coping skills post d/c.   Behavioral Response:    Intervention: Worksheet   Activity: Coping A to Z. Patient asked to identify what a coping skill is and when they use them. Patients with Clinical research associate discussed healthy versus unhealthy coping skills. Next patients were given a blank worksheet titled Coping Skills A-Z. Patients were instructed to come up with at least one positive coping skill per letter of the alphabet.Patients were given 15 minutes to brainstorm before ideas were presented to the large group. Patients and LRT debriefed on the importance of coping skill selection based on situation and back-up plans when a skill tried is not effective. At the end of group, patients were given an handout of alphabetized strategies to keep for future reference.   Education: Pharmacologist, Scientist, physiological, Discharge Planning.    Education Outcome: Acknowledges education/Verbalizes understanding/In group clarification offered/Additional education needed    Clinical Observations/Individualized Feedback: Due to STAR on unit, group was unable to occur.   Plan: Continue to engage patient in RT group sessions 2-3x/week.   Kiran Lapine-McCall, LRT,CTRS 03/13/2024 1:20 PM

## 2024-03-14 DIAGNOSIS — F259 Schizoaffective disorder, unspecified: Secondary | ICD-10-CM | POA: Diagnosis not present

## 2024-03-14 DIAGNOSIS — F4310 Post-traumatic stress disorder, unspecified: Secondary | ICD-10-CM | POA: Diagnosis not present

## 2024-03-14 DIAGNOSIS — F411 Generalized anxiety disorder: Secondary | ICD-10-CM | POA: Diagnosis not present

## 2024-03-14 LAB — GLUCOSE, CAPILLARY: Glucose-Capillary: 91 mg/dL (ref 70–99)

## 2024-03-14 MED ORDER — HALOPERIDOL DECANOATE 100 MG/ML IM SOLN
100.0000 mg | Freq: Once | INTRAMUSCULAR | Status: AC
  Administered 2024-03-14: 100 mg via INTRAMUSCULAR
  Filled 2024-03-14: qty 1

## 2024-03-14 NOTE — Progress Notes (Signed)
(  Sleep Hours) - 6.5 hrs (Any PRNs that were needed, meds refused, or side effects to meds)- Atarax  (Any disturbances and when (visitation, over night) (Concerns raised by the patient)- none (SI/HI/AVH)- denies

## 2024-03-14 NOTE — Group Note (Signed)
 Date:  03/14/2024 Time:  8:28 PM  Group Topic/Focus:  Wrap-Up Group:   The focus of this group is to help patients review their daily goal of treatment and discuss progress on daily workbooks.    Participation Level:  Did Not Attend   Jakaya Jacobowitz Dacosta 03/14/2024, 8:28 PM

## 2024-03-14 NOTE — Plan of Care (Signed)

## 2024-03-14 NOTE — Group Note (Signed)
 Recreation Therapy Group Note   Group Topic:Problem Solving  Group Date: 03/14/2024 Start Time: 1015 End Time: 1055 Facilitators: Harlis Champoux-McCall, LRT,CTRS Location: [address] Dayroom   Group Topic: Communication, Team Building, Problem Solving  Goal Area(s) Addresses:  Patient will effectively work with peer towards shared goal.  Patient will identify skills used to make activity successful.  Patient will identify how skills used during activity can be used to reach post d/c goals.   Behavioral Response: Engaged  Intervention: STEM Activity  Activity: Stage manager. In teams of 3-5, patients were given 12 plastic drinking straws and an equal length of masking tape. Using the materials provided, patients were asked to build a landing pad to catch a golf ball dropped from approximately 5 feet in the air. All materials were required to be used by the team in their design. LRT facilitated post-activity discussion.  Education: Pharmacist, community, Scientist, physiological, Discharge Planning   Education Outcome: Acknowledges education/In group clarification offered/Needs additional education.    Affect/Mood: Appropriate   Participation Level: Engaged   Participation Quality: Independent   Behavior: Appropriate   Speech/Thought Process: Focused   Insight: Good   Judgement: Good   Modes of Intervention: STEM Activity   Patient Response to Interventions:  Engaged   Education Outcome:  In group clarification offered    Clinical Observations/Individualized Feedback: Pt was more focused and brighter. Pt was also more social with peers. Pt was very vocal with giving suggestions. Pt worked well with peers and intent on being successful.      Plan: Continue to engage patient in RT group sessions 2-3x/week.   Ashtin Melichar-McCall, LRT,CTRS 03/14/2024 1:18 PM

## 2024-03-14 NOTE — Plan of Care (Signed)
   Problem: Education: Goal: Emotional status will improve Outcome: Progressing Goal: Mental status will improve Outcome: Progressing   Problem: Safety: Goal: Periods of time without injury will increase Outcome: Progressing

## 2024-03-14 NOTE — Group Note (Signed)
 Date:  03/14/2024 Time:  2:02 AM  Group Topic/Focus:  Wrap-Up Group:   The focus of this group is to help patients review their daily goal of treatment and discuss progress on daily workbooks.    Participation Level:  Minimal  Participation Quality:  Appropriate and Sharing  Affect:  Appropriate and Flat  Cognitive:  Appropriate  Insight: Appropriate and Limited  Engagement in Group:  Engaged and Limited  Modes of Intervention:  Discussion and Socialization  Additional Comments:  Patient shared that she had an okay day and rated it a 5 out 10 day. Patient shared her goal for today was to have no anxiety or be paranoid and to open up more.   Eward Mace 03/14/2024, 2:02 AM

## 2024-03-14 NOTE — Progress Notes (Signed)
(  Sleep Hours) -  (Any PRNs that were needed, meds refused, or side effects to meds)- Vistaril  25 mg  (Any disturbances and when (visitation, over night)- N/A  (Concerns raised by the patient)- N/A  (SI/HI/AVH)-denies
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
## 2024-03-14 NOTE — Progress Notes (Signed)
   03/14/24 0802  Psych Admission Type (Psych Patients Only)  Admission Status Involuntary  Psychosocial Assessment  Patient Complaints Anxiety  Eye Contact Fair  Facial Expression Flat  Affect Anxious  Speech Logical/coherent  Interaction Assertive  Motor Activity Slow  Appearance/Hygiene Unremarkable  Behavior Characteristics Cooperative;Anxious  Mood Anxious  Thought Process  Coherency WDL  Content Preoccupation  Delusions None reported or observed  Perception WDL  Hallucination None reported or observed  Judgment Limited  Confusion None  Danger to Self  Current suicidal ideation? Denies  Danger to Others  Danger to Others None reported or observed

## 2024-03-14 NOTE — Plan of Care (Signed)
   Problem: Education: Goal: Emotional status will improve Outcome: Progressing Goal: Mental status will improve Outcome: Progressing Goal: Verbalization of understanding the information provided will improve Outcome: Progressing

## 2024-03-15 MED ORDER — ACETAMINOPHEN 500 MG PO TABS
1000.0000 mg | ORAL_TABLET | Freq: Two times a day (BID) | ORAL | Status: DC | PRN
  Administered 2024-03-17 – 2024-03-18 (×2): 1000 mg via ORAL
  Filled 2024-03-15 (×2): qty 2

## 2024-03-15 NOTE — Progress Notes (Signed)
   03/15/24 1654  Psychosocial Assessment  Patient Complaints Isolation  Eye Contact Fair  Facial Expression Flat  Affect Flat  Speech Soft  Interaction Assertive  Motor Activity Slow  Appearance/Hygiene Unremarkable  Behavior Characteristics Cooperative  Mood Suspicious  Thought Process  Coherency WDL  Content Preoccupation  Delusions None reported or observed  Perception WDL  Hallucination None reported or observed  Judgment Limited  Confusion WDL

## 2024-03-15 NOTE — Group Note (Signed)
 Recreation Therapy Group Note   Group Topic:Relaxation  Group Date: 03/15/2024 Start Time: 1000 End Time: 1040 Facilitators: Basil Buffin-McCall, LRT,CTRS Location: 500 [address] Dayroom   Group Topic: Relaxation  Goal Area(s) Addresses:  Patient will identify the benefits of music.  Patient will identify benefit of practicing music therapy post d/c.   Behavioral Response:   Intervention: Music, Blue [place]oth Speaker  Activity: Music Therapy. LRT and patients discussed the importance of music and how it can be used. LRT and patients discussed how music can illicit different emotions as well as be used as a coping mechanism to deal with life's challenges. Patients were then given to chance to choose songs that had importance to them. Patients would then explain the significance of the song they chose.    Education: Relaxation, Discharge Planning.   Education Outcome: Acknowledges education/In group clarification offered   Affect/Mood: N/A   Participation Level: Did not attend    Clinical Observations/Individualized Feedback:      Plan: Continue to engage patient in RT group sessions 2-3x/week.   Daphney Hopke-McCall, LRT,CTRS  03/15/2024 12:35 PM

## 2024-03-15 NOTE — Group Note (Signed)
 Date:  03/15/2024 Time:  6:31 PM  Group Topic/Focus:  Goals Group:   The focus of this group is to help patients establish daily goals to achieve during treatment and discuss how the patient can incorporate goal setting into their daily lives to aide in recovery. Orientation:   The focus of this group is to educate the patient on the purpose and policies of crisis stabilization and provide a format to answer questions about their admission.  The group details unit policies and expectations of patients while admitted.    Participation Level:  Did Not Attend  Colleen Peck Molly 03/15/2024, 6:31 PM

## 2024-03-15 NOTE — Plan of Care (Signed)
  Problem: Activity: Goal: Interest or engagement in activities will improve Outcome: Progressing   Problem: Safety: Goal: Periods of time without injury will increase Outcome: Progressing

## 2024-03-15 NOTE — Progress Notes (Signed)
(  Sleep Hours) -10  (Any PRNs that were needed, meds refused, or side effects to meds)- Vistaril  25 mg  (Any disturbances and when (visitation, over night)-N/A  (Concerns raised by the patient)- Pt stated she had back pain 8/10 and was given Heat packs because she stated Tylenol  did not help her earlier. NP on call notified for request of Ibuprofen  from the patient , NP increased the Tylenol  that did not help. Pt has been visible on the unit some, pt stated she did come out her room some today like writer suggested yesterday, pt encouraged to try to do even more tomorrow.   (SI/HI/AVH)-denies

## 2024-03-15 NOTE — Group Note (Signed)
 Date:  03/15/2024 Time:  8:53 PM  Group Topic/Focus:  Wrap-Up Group:   The focus of this group is to help patients review their daily goal of treatment and discuss progress on daily workbooks.    Participation Level:  Did Not Attend  Colleen Peck 03/15/2024, 8:53 PM

## 2024-03-15 NOTE — Plan of Care (Signed)
   Problem: Education: Goal: Emotional status will improve Outcome: Progressing Goal: Mental status will improve Outcome: Progressing   Problem: Activity: Goal: Interest or engagement in activities will improve Outcome: Progressing Goal: Sleeping patterns will improve Outcome: Progressing   Problem: Safety: Goal: Periods of time without injury will increase Outcome: Progressing
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
## 2024-03-16 ENCOUNTER — Encounter (HOSPITAL_COMMUNITY): Payer: Self-pay

## 2024-03-16 NOTE — Plan of Care (Signed)
   Problem: Education: Goal: Emotional status will improve Outcome: Progressing Goal: Mental status will improve Outcome: Progressing   Problem: Activity: Goal: Sleeping patterns will improve Outcome: Progressing   Problem: Safety: Goal: Periods of time without injury will increase Outcome: Progressing

## 2024-03-16 NOTE — Group Note (Signed)
 Date:  03/16/2024 Time:  8:44 PM  Group Topic/Focus:  Wrap-Up Group:   The focus of this group is to help patients review their daily goal of treatment and discuss progress on daily workbooks.    Participation Level:  Did Not Attend   Colleen Peck 03/16/2024, 8:44 PM

## 2024-03-16 NOTE — [facility] Group Notes (Signed)
 Spirituality Group   Description: Participant directed exploration of values, beliefs and meaning   Following a brief framework of chaplain's role and ground rules of group behavior, participants are invited to share concerns or questions that engage spiritual life. Emphasis placed on common themes and shared experiences and ways to make meaning and clarify living into one's values.   Theory/Process/Goal: Utilize the theoretical framework of group therapy established by Celena Kite, Relational Cultural Theory and Rogerian approaches to facilitate relational empathy and use of the "here and now" to foster reflection, self-awareness, and sharing.   Observations: Colleen Peck attended group for 10-45min. She did not actively participate in the discussion but made an attempt to be present.  Whitman Meinhardt L. Delores HERO.Div

## 2024-03-16 NOTE — Progress Notes (Signed)
(  Sleep Hours) -9  (Any PRNs that were needed, meds refused, or side effects to meds)- Vistaril  25 mg  (Any disturbances and when (visitation, over night)- N/A  (Concerns raised by the patient)- N/A  (SI/HI/AVH)-denies

## 2024-03-16 NOTE — Progress Notes (Signed)
   03/16/24 1028  Psych Admission Type (Psych Patients Only)  Admission Status Involuntary  Psychosocial Assessment  Patient Complaints Anxiety  Eye Contact Fair  Facial Expression Flat  Affect Anxious  Speech Logical/coherent  Interaction Assertive  Motor Activity Slow  Appearance/Hygiene Unremarkable  Behavior Characteristics Cooperative  Mood Anxious  Thought Process  Coherency WDL  Content Preoccupation  Delusions None reported or observed  Perception WDL  Hallucination None reported or observed  Judgment Limited  Confusion WDL  Danger to Self  Current suicidal ideation? Denies  Danger to Others  Danger to Others None reported or observed

## 2024-03-16 NOTE — Progress Notes (Signed)
 Pt requested Ibuprofen  for her back pain, because she said the Tylenol  did not help her back pain earlier. NP on call messaged a they increased the Tylenol . Pt was requesting a NSAID , pt encouraged to talk to the doctor.

## 2024-03-16 NOTE — Progress Notes (Signed)
 CSW called and spoke with patient's mother and legal guardian Colleen Peck  regarding discharge plan and questions related to outpatient follow-up appointments. Colleen Peck reported not being aware of our facility being short-term. I explained that we've always been a short-term acute facility for patients in crisis and that patients aren't typically with us  for longer than 7 days unless the doctor feels they are still a danger to themselves or others/need changes in their medications, etc. CSW also explained process regarding court and how the psychiatrist typically recommends discharge if the patient is not a danger to themselves/others any longer and has proper follow-up appointments in place, which this patient does. Informed her that although the doctor may recommend one thing, this does not necessarily mean the judge will recommend the same. Mother reported understanding and stated she wanted to be involved in the patient's upcoming court hearing on 9/9. Informed her we would provide her with the link once we receive it. Colleen Peck asked to be given a firm discharge date once the doctor makes the decision. CSW to communicate discharge date as patient will be returning to her mother's home as planned.  Colleen Peck, LCSWA 03/16/24 3:33PM

## 2024-03-16 NOTE — BH IP Treatment Plan (Signed)
 Interdisciplinary Treatment and Diagnostic Plan Update  03/16/2024 Time of Session: 12:15 PM - UPDATE Colleen Peck MRN: [id number]  Principal Diagnosis: Schizoaffective disorder (HCC)  Secondary Diagnoses: Principal Problem:   Schizoaffective disorder (HCC) Active Problems:   PTSD (post-traumatic stress disorder)   GAD (generalized anxiety disorder)   Current Medications:  Current Facility-Administered Medications  Medication Dose Route Frequency Provider Last Rate Last Admin   acetaminophen  (TYLENOL ) tablet 1,000 mg  1,000 mg Oral BID PRN Onuoha, Chinwendu V, NP       albuterol  (VENTOLIN  HFA) 108 (90 Base) MCG/ACT inhaler 2 puff  2 puff Inhalation Q4H PRN Mills, Shnese E, NP       alum & mag hydroxide-simeth (MAALOX/MYLANTA) 200-200-20 MG/5ML suspension 30 mL  30 mL Oral Q4H PRN Mills, Shnese E, NP       atorvastatin  (LIPITOR) tablet 10 mg  10 mg Oral QHS Parker, Alvin S, MD   10 mg at 03/15/24 1941   haloperidol  (HALDOL ) tablet 5 mg  5 mg Oral TID PRN Moishe Bernadette BRAVO, NP       And   diphenhydrAMINE  (BENADRYL ) capsule 50 mg  50 mg Oral TID PRN Mills, Shnese E, NP       haloperidol  lactate (HALDOL ) injection 5 mg  5 mg Intramuscular TID PRN Mills, Shnese E, NP   5 mg at 03/06/24 0940   And   diphenhydrAMINE  (BENADRYL ) injection 50 mg  50 mg Intramuscular TID PRN Mills, Shnese E, NP   50 mg at 03/06/24 0940   And   LORazepam  (ATIVAN ) injection 2 mg  2 mg Intramuscular TID PRN Mills, Shnese E, NP   2 mg at 03/06/24 0941   haloperidol  lactate (HALDOL ) injection 10 mg  10 mg Intramuscular TID PRN Moishe Bernadette BRAVO, NP       And   diphenhydrAMINE  (BENADRYL ) injection 50 mg  50 mg Intramuscular TID PRN Moishe Bernadette BRAVO, NP       And   LORazepam  (ATIVAN ) injection 2 mg  2 mg Intramuscular TID PRN Mills, Shnese E, NP       docusate sodium  (COLACE) capsule 100 mg  100 mg Oral BID Parker, Alvin S, MD   100 mg at 03/15/24 0909   feeding supplement (ENSURE PLUS HIGH PROTEIN) liquid 237 mL  237  mL Oral BID BM Pashayan, Alexander S, DO   237 mL at 03/16/24 1403   haloperidol  (HALDOL ) tablet 5 mg  5 mg Oral BID Bennett, Christal H, NP   5 mg at 03/16/24 0830   hydrOXYzine  (ATARAX ) tablet 25 mg  25 mg Oral TID PRN Mills, Shnese E, NP   25 mg at 03/15/24 1941   levothyroxine  (SYNTHROID ) tablet 200 mcg  200 mcg Oral Q0600 Parker, Alvin S, MD   200 mcg at 03/16/24 0624   magnesium  hydroxide (MILK OF MAGNESIA) suspension 30 mL  30 mL Oral Daily PRN Mills, Shnese E, NP       Vitamin D  (Ergocalciferol ) (DRISDOL ) 1.25 MG (50000 UNIT) capsule 50,000 Units  50,000 Units Oral Daily Parker, Alvin S, MD   50,000 Units at 03/16/24 0830   PTA Medications: Medications Prior to Admission  Medication Sig Dispense Refill Last Dose/Taking   albuterol  (VENTOLIN  HFA) 108 (90 Base) MCG/ACT inhaler INHALE 2 PUFFS INTO THE LUNGS EVERY 4 HOURS AS NEEDED FOR WHEEZING OR SHORTNESS OF BREATH 18 g 1    lamoTRIgine  (LAMICTAL ) 25 MG tablet Take 25 mg by mouth daily.      levothyroxine  (SYNTHROID ) 200  MCG tablet Take 1 tablet (200 mcg total) by mouth daily at 6 (six) AM. (Patient not taking: Reported on 09/27/2023) 30 tablet 2    triamcinolone  ointment (KENALOG ) 0.1 % Apply topically 2 (two) times daily. (Patient not taking: Reported on 09/27/2023)       Patient Stressors: Other: She would not say    Patient Strengths: Other: UTA  Treatment Modalities: Medication Management, Group therapy, Case management,  1 to 1 session with clinician, Psychoeducation, Recreational therapy.   Physician Treatment Plan for Primary Diagnosis: Schizoaffective disorder (HCC) Long Term Goal(s): Improvement in symptoms so as ready for discharge   Short Term Goals: Ability to identify changes in lifestyle to reduce recurrence of condition will improve  Medication Management: Evaluate patient's response, side effects, and tolerance of medication regimen.  Therapeutic Interventions: 1 to 1 sessions, Unit Group sessions and Medication  administration.  Evaluation of Outcomes: Progressing  Physician Treatment Plan for Secondary Diagnosis: Principal Problem:   Schizoaffective disorder (HCC) Active Problems:   PTSD (post-traumatic stress disorder)   GAD (generalized anxiety disorder)  Long Term Goal(s): Improvement in symptoms so as ready for discharge   Short Term Goals: Ability to identify changes in lifestyle to reduce recurrence of condition will improve     Medication Management: Evaluate patient's response, side effects, and tolerance of medication regimen.  Therapeutic Interventions: 1 to 1 sessions, Unit Group sessions and Medication administration.  Evaluation of Outcomes: Progressing   RN Treatment Plan for Primary Diagnosis: Schizoaffective disorder (HCC) Long Term Goal(s): Knowledge of disease and therapeutic regimen to maintain health will improve  Short Term Goals: Ability to remain free from injury will improve, Ability to verbalize frustration and anger appropriately will improve, Ability to verbalize feelings will improve, and Ability to disclose and discuss suicidal ideas  Medication Management: RN will administer medications as ordered by provider, will assess and evaluate patient's response and provide education to patient for prescribed medication. RN will report any adverse and/or side effects to prescribing provider.  Therapeutic Interventions: 1 on 1 counseling sessions, Psychoeducation, Medication administration, Evaluate responses to treatment, Monitor vital signs and CBGs as ordered, Perform/monitor CIWA, COWS, AIMS and Fall Risk screenings as ordered, Perform wound care treatments as ordered.  Evaluation of Outcomes: Progressing   LCSW Treatment Plan for Primary Diagnosis: Schizoaffective disorder (HCC) Long Term Goal(s): Safe transition to appropriate next level of care at discharge, Engage patient in therapeutic group addressing interpersonal concerns.  Short Term Goals: Engage patient  in aftercare planning with referrals and resources, Increase ability to appropriately verbalize feelings, Facilitate acceptance of mental health diagnosis and concerns, and Identify triggers associated with mental health/substance abuse issues  Therapeutic Interventions: Assess for all discharge needs, 1 to 1 time with Social worker, Explore available resources and support systems, Assess for adequacy in community support network, Educate family and significant other(s) on suicide prevention, Complete Psychosocial Assessment, Interpersonal group therapy.  Evaluation of Outcomes: Progressing   Progress in Treatment: Attending groups: No. Participating in groups: No. Taking medication as prescribed: Yes. Toleration medication: Yes. Family/Significant other contact made: No, will contact [name] (mother & legal guardian) Patient understands diagnosis: No. Discussing patient identified problems/goals with staff: No. Medical problems stabilized or resolved: Yes. Denies suicidal/homicidal ideation: Yes. Issues/concerns per patient self-inventory: No.     New problem(s) identified:  No   New Short Term/Long Term Goal(s):     medication stabilization, elimination of SI thoughts, development of comprehensive mental wellness plan.      Patient Goals:  Patient had covers over her head and refused to participate in the meeting.     Discharge Plan or Barriers:  Patient recently admitted. CSW will continue to follow and assess for appropriate referrals and possible discharge planning.      Reason for Continuation of Hospitalization: Anxiety Medication stabilization Paranoia   Estimated Length of Stay:  4 - 5  days  Last 3 [place] Suicide Severity Risk Score: Flowsheet Row Admission (Current) [facility] from 03/03/2024 in [facility] Department at [facility] ED from 09/26/2023 in [facility] Emergency Department  at [facility]  C-SSRS RISK CATEGORY No Risk No Risk No Risk    Last PHQ 2/9 Scores:    05/10/2021    6:25 PM 03/27/2020    2:12 PM 02/04/2020    1:40 PM  Depression screen PHQ 2/9  Decreased Interest 1 0 0  Down, Depressed, Hopeless 2 0 0  PHQ - 2 Score 3 0 0  Altered sleeping 1 0 1  Tired, decreased energy 1 0 1  Change in appetite 1 3 0  Feeling bad or failure about yourself  2 0 0  Trouble concentrating 2 3 0  Moving slowly or fidgety/restless 2 0 0  Suicidal thoughts 2 0 0  PHQ-9 Score 14 6 2   Difficult doing work/chores Very difficult      Scribe for Treatment Team: Mikyah Alamo O Sharise Lippy, LCSWA 03/16/2024 8:09 PM
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
## 2024-03-16 NOTE — Group Note (Signed)
 Recreation Therapy Group Note   Group Topic:Coping Skills  Group Date: 03/16/2024 Start Time: 1015 End Time: 1045 Facilitators: Dunya Meiners-McCall, LRT,CTRS Location: [address] Dayroom   Group Topic: Coping Skills    Goal Area(s) Addresses: Patient will define what a coping skill is. Patient will create a list of healthy coping skills beginning with each letter of the alphabet. Patient will successfully identify positive coping skills they can use post d/c.  Patient will acknowledge benefit(s) of using learned coping skills post d/c.   Behavioral Response:    Intervention: Worksheet   Activity: Coping A to Z. Patient asked to identify what a coping skill is and when they use them. Patients with Clinical research associate discussed healthy versus unhealthy coping skills. Next patients were given a blank worksheet titled Coping Skills A-Z. Patients were instructed to come up with at least one positive coping skill per letter of the alphabet.Patients were given 15 minutes to brainstorm before ideas were presented to the large group. Patients and LRT debriefed on the importance of coping skill selection based on situation and back-up plans when a skill tried is not effective. At the end of group, patients were given an handout of alphabetized strategies to keep for future reference.   Education: Pharmacologist, Scientist, physiological, Discharge Planning.    Education Outcome: Acknowledges education/Verbalizes understanding/In group clarification offered/Additional education needed   Affect/Mood: N/A   Participation Level: Did not attend    Clinical Observations/Individualized Feedback:      Plan: Continue to engage patient in RT group sessions 2-3x/week.   Edithe Dobbin-McCall, LRT,CTRS  03/16/2024 1:10 PM

## 2024-03-16 NOTE — Plan of Care (Signed)
  Problem: Activity: Goal: Sleeping patterns will improve Outcome: Progressing   Problem: Coping: Goal: Ability to verbalize frustrations and anger appropriately will improve Outcome: Progressing Goal: Ability to demonstrate self-control will improve Outcome: Progressing   Problem: Safety: Goal: Periods of time without injury will increase Outcome: Progressing

## 2024-03-17 NOTE — Progress Notes (Signed)
   03/17/24 2100  Psych Admission Type (Psych Patients Only)  Admission Status Involuntary  Psychosocial Assessment  Patient Complaints Isolation  Eye Contact Fair  Facial Expression Animated  Affect Appropriate to circumstance  Speech Logical/coherent  Interaction Assertive  Motor Activity Slow  Appearance/Hygiene Unremarkable  Behavior Characteristics Cooperative  Mood Suspicious;Pleasant  Thought Process  Coherency WDL  Content Preoccupation  Delusions None reported or observed  Perception WDL  Hallucination None reported or observed  Judgment Limited  Confusion None  Danger to Self  Current suicidal ideation? Denies  Danger to Others  Danger to Others None reported or observed

## 2024-03-17 NOTE — Plan of Care (Signed)
   Problem: Health Behavior/Discharge Planning: Goal: Compliance with treatment plan for underlying cause of condition will improve Outcome: Progressing   Problem: Safety: Goal: Periods of time without injury will increase Outcome: Progressing

## 2024-03-17 NOTE — Plan of Care (Signed)
  Problem: Coping: Goal: Ability to verbalize frustrations and anger appropriately will improve Outcome: Progressing   Problem: Safety: Goal: Periods of time without injury will increase Outcome: Progressing   Problem: Activity: Goal: Interest or engagement in activities will improve Outcome: Not Progressing   
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
## 2024-03-17 NOTE — [facility] Group Notes (Signed)
 Adult Psychoeducational Group Note  Date:  03/17/2024 Time:  7:40 PM  Group Topic/Focus:  Goals Group:   The focus of this group is to help patients establish daily goals to achieve during treatment and discuss how the patient can incorporate goal setting into their daily lives to aide in recovery. Orientation:   The focus of this group is to educate the patient on the purpose and policies of crisis stabilization and provide a format to answer questions about their admission.  The group details unit policies and expectations of patients while admitted.  Participation Level:  Did Not Attend  Participation Quality:    Affect:    Cognitive:    Insight:   Engagement in Group:    Modes of Intervention:    Additional Comments:    Che Below O 03/17/2024, 7:40 PM

## 2024-03-17 NOTE — Group Note (Signed)
 Date:  03/17/2024 Time:  8:40 PM  Group Topic/Focus:  Wrap-Up Group:   The focus of this group is to help patients review their daily goal of treatment and discuss progress on daily workbooks.    Participation Level:  Did Not Attend  Colleen Peck 03/17/2024, 8:40 PM

## 2024-03-18 NOTE — Group Note (Signed)
 [facility] LCSW Group Therapy Note   Group Date: 03/18/2024 Start Time: 1100 End Time: 1200   Type of Therapy/Topic:  Group Therapy: Balancing Life with Trust, Honesty, and Boundaries.   Participation Level:  Did Not Attend   Hunter JONELLE Lever, LCSWA
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
## 2024-03-18 NOTE — Progress Notes (Signed)
(  Sleep Hours) -8.75  (Any PRNs that were needed, meds refused, or side effects to meds)- Vistaril  25 mg  (Any disturbances and when (visitation, over night)- N/A  (Concerns raised by the patient)- none  (SI/HI/AVH)-denies

## 2024-03-18 NOTE — [facility] Group Notes (Signed)
 Adult Psychoeducational Group Note  Date:  03/18/2024 Time:  7:43 PM  Group Topic/Focus:  Goals Group:   The focus of this group is to help patients establish daily goals to achieve during treatment and discuss how the patient can incorporate goal setting into their daily lives to aide in recovery. Orientation:   The focus of this group is to educate the patient on the purpose and policies of crisis stabilization and provide a format to answer questions about their admission.  The group details unit policies and expectations of patients while admitted.  Participation Level:  Did Not Attend  Participation Quality:    Affect:    Cognitive:    Insight:   Engagement in Group:    Modes of Intervention:    Additional Comments:    Shawnette Augello O 03/18/2024, 7:43 PM

## 2024-03-18 NOTE — Plan of Care (Signed)
   Problem: Education: Goal: Emotional status will improve Outcome: Progressing Goal: Mental status will improve Outcome: Progressing   Problem: Activity: Goal: Sleeping patterns will improve Outcome: Progressing   Problem: Safety: Goal: Periods of time without injury will increase Outcome: Progressing

## 2024-03-18 NOTE — Progress Notes (Signed)
(  Sleep Hours) - 8 (Any PRNs that were needed, meds refused, or side effects to meds)- none (Any disturbances and when (visitation, over night)- none (Concerns raised by the patient)- none (SI/HI/AVH)- denies

## 2024-03-18 NOTE — Group Note (Signed)
 Date:  03/18/2024 Time:  8:30 PM  Group Topic/Focus:  Wrap-Up Group:   The focus of this group is to help patients review their daily goal of treatment and discuss progress on daily workbooks.    Participation Level:  Did Not Attend  Taiya Nutting Dacosta 03/18/2024, 8:30 PM

## 2024-03-19 NOTE — Plan of Care (Signed)
   Problem: Education: Goal: Emotional status will improve Outcome: Progressing Goal: Mental status will improve Outcome: Progressing   Problem: Activity: Goal: Sleeping patterns will improve Outcome: Progressing   Problem: Coping: Goal: Ability to demonstrate self-control will improve Outcome: Progressing   Problem: Safety: Goal: Periods of time without injury will increase Outcome: Progressing   Problem: Activity: Goal: Interest or engagement in activities will improve Outcome: Not Progressing
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
## 2024-03-19 NOTE — Progress Notes (Signed)
(  Sleep Hours) -8.75  (Any PRNs that were needed, meds refused, or side effects to meds)- Vistaril  25 mg  (Any disturbances and when (visitation, over night)-N/A  (Concerns raised by the patient)- None  (SI/HI/AVH)-denies

## 2024-03-19 NOTE — Plan of Care (Signed)
   Problem: Education: Goal: Emotional status will improve Outcome: Not Progressing Goal: Mental status will improve Outcome: Not Progressing

## 2024-03-19 NOTE — Group Note (Signed)
 Recreation Therapy Group Note   Group Topic:Stress Management  Group Date: 03/19/2024 Start Time: 1025 End Time: 1053 Facilitators: Susana Duell-McCall, LRT,CTRS Location: [address] Dayroom   Group Focus: Stress Management  Goal Area(s) Addresses:  Patient will actively participate in stress management techniques presented during session.  Patient will successfully identify benefit of practicing stress management post d/c.   Behavioral Response: Engaged  Intervention: Guided exercise with ambient sound and script  Activity : Progressive Muscle Relaxation. LRT provided education, instruction and demonstration on practice of Progressive Muscle Relaxation. Patients also participated in yoga poses to help stretch and release tension as well. LRT informed pts about resources to access pre-recorded scripts for PMR post d/c via Youtube and other apps or via internet with a smartphone, tablet, and/or computer.  Education:  Stress Management, Discharge Planning.   Education Outcome: Acknowledges education   Affect/Mood: Appropriate   Participation Level: Engaged   Participation Quality: Independent   Behavior: Appropriate   Speech/Thought Process: Focused   Insight: Good   Judgement: Good   Modes of Intervention: Stretching, Yoga   Patient Response to Interventions:  Engaged   Education Outcome:  In group clarification offered    Clinical Observations/Individualized Feedback: Pt was bright and engaged in the techniques presented. Pt was a little apprehensive about the yoga poses due to issues with her back. Pt did express being surprised she didn't strain her back but it actually felt better at the conclusion of group.     Plan: Continue to engage patient in RT group sessions 2-3x/week.   Jacquel Redditt-McCall, LRT,CTRS  03/19/2024 12:04 PM

## 2024-03-19 NOTE — Care Management Important Message (Signed)
 Medicare IM printed and given to social work to give to the patient on the unit.

## 2024-03-19 NOTE — Group Note (Signed)
 LCSW Group Therapy Note   Group Date: 03/19/2024 Start Time: 1300 End Time: 1400   Participation:  did not attend  Type of Therapy:  Group Therapy  Topic:  Stronger Together:  Building Healthy Relationships  Objective:  To explore loneliness, boundaries, and safe ways to build relationships.  Goals: Recognize healthy vs. unhealthy relationships. Learn safe ways to connect with others. Strengthen communication and [employer].  Summary:  Participants discussed loneliness, healthy connections, and setting boundaries. They explored safe ways to meet people and shared personal experiences. Key insights were reinforced through discussion and quotes.  Therapeutic Modalities Used: Cognitive Behavioral Therapy (CBT) Elements - Identifying unhealthy relationship patterns, challenging negative thoughts about connection. Dialectical Behavior Therapy (DBT) Elements - Interpersonal effectiveness, setting and maintaining boundaries. Supportive Group Therapy - Peer discussion, shared experiences, and emotional validation.   Colleen Peck, LCSWA 03/19/2024  6:04 PM

## 2024-03-19 NOTE — Group Note (Signed)
 Date:  03/19/2024 Time:  8:53 PM  Group Topic/Focus:  Wrap-Up Group:   The focus of this group is to help patients review their daily goal of treatment and discuss progress on daily workbooks.    Participation Level:  Did Not Attend  Colleen Peck 03/19/2024, 8:53 PM

## 2024-03-19 NOTE — Progress Notes (Signed)
   03/19/24 1400  Psych Admission Type (Psych Patients Only)  Admission Status Involuntary  Psychosocial Assessment  Patient Complaints Isolation  Eye Contact Fair  Facial Expression Flat  Affect Appropriate to circumstance  Speech Logical/coherent  Interaction Assertive  Motor Activity Slow  Appearance/Hygiene In scrubs  Behavior Characteristics Cooperative  Mood Suspicious  Thought Process  Coherency WDL  Content WDL  Delusions WDL  Perception WDL  Hallucination None reported or observed  Judgment Limited  Confusion None  Danger to Self  Current suicidal ideation? Denies  Danger to Others  Danger to Others None reported or observed

## 2024-03-20 ENCOUNTER — Ambulatory Visit (HOSPITAL_COMMUNITY): Admitting: Licensed Clinical Social Worker

## 2024-03-20 NOTE — Plan of Care (Signed)
   Problem: Education: Goal: Emotional status will improve Outcome: Not Progressing Goal: Mental status will improve Outcome: Not Progressing

## 2024-03-20 NOTE — Progress Notes (Signed)
 CSW called a spoke with patient's mother/legal guardian, Rock, to explain that pt is no longer under IVC therefore court would not be happening today. Discussed that pt has been here for 2 weeks and has improved to the point where she's not a danger to herself/others and the providers feel she's safe to return to community. Explained outpatient follow-up appts with [facility] and [facility] where pt can go for therapy and LAI injection for continued med compliance. Rock is concerned that once pt is discharged she will no longer taking medications. Empathized with Rock but reminded her this is a short-term facility and patient is scheduled to be discharged tomorrow. Rock understood and had questions about patient's medications that were out of my scope to answer. Informed patient's providers that she's requesting they call her regarding meds.   Ondrea Dow, LCSWA 03/20/24 9:31am

## 2024-03-20 NOTE — Progress Notes (Signed)
(  Sleep Hours) -13  (Any PRNs that were needed, meds refused, or side effects to meds)- Vistaril  25 mg  (Any disturbances and when (visitation, over night)- N/A   (Concerns raised by the patient)- None  (SI/HI/AVH)- denies

## 2024-03-20 NOTE — Group Note (Signed)
 Recreation Therapy Group Note   Group Topic:Communication  Group Date: 03/20/2024 Start Time: 1040 End Time: 1110 Facilitators: Arlayne Liggins-McCall, LRT,CTRS Location: [address] Dayroom   Group Topic: Communication, Problem Solving   Goal Area(s) Addresses:  Patient will effectively listen to complete activity.  Patient will identify communication skills used to make activity successful.  Patient will identify how skills used during activity can be used to reach post d/c goals.    Behavioral Response: Minimal   Intervention: Building surveyor Activity - Geometric pattern cards, pencils, blank paper    Activity: Geometric Drawings.  Three volunteers from the peer group will be shown an abstract picture with a particular arrangement of geometrical shapes.  Each round, one 'speaker' will describe the pattern, as accurately as possible without revealing the image to the group.  The remaining group members will listen and draw the picture to reflect how it is described to them. Patients with the role of 'listener' cannot ask clarifying questions but, may request that the speaker repeat a direction. Once the drawings are complete, the presenter will show the rest of the group the picture and compare how close each person came to drawing the picture. LRT will facilitate a post-activity discussion regarding effective communication and the importance of planning, listening, and asking for clarification in daily interactions with others.  Education: Environmental consultant, Active listening, Support systems, Discharge planning  Education Outcome: Acknowledges understanding/In group clarification offered/Needs additional education.    Affect/Mood: Appropriate   Participation Level: Minimal   Participation Quality: Independent   Behavior: Appropriate   Speech/Thought Process: Focused   Insight: Moderate   Judgement: Moderate   Modes of Intervention: Activity and Problem-solving    Patient Response to Interventions:  Receptive   Education Outcome:  In group clarification offered    Clinical Observations/Individualized Feedback: Pt came right before the last presentation. Pt attempted to participate but was unable to catch on with the activity. Pt left and didn't return.     Plan: Continue to engage patient in RT group sessions 2-3x/week.   Colleen Peck, LRT,CTRS 03/20/2024 1:06 PM

## 2024-03-20 NOTE — Plan of Care (Signed)
   Problem: Education: Goal: Emotional status will improve Outcome: Progressing Goal: Mental status will improve Outcome: Progressing   Problem: Activity: Goal: Sleeping patterns will improve Outcome: Progressing   Problem: Safety: Goal: Periods of time without injury will increase Outcome: Progressing

## 2024-03-20 NOTE — [facility] Group Notes (Signed)
 [facility] Group Notes:  (Nursing/MHT/Case Management/Adjunct)  Date:  03/20/2024  Time:  9:06 PM  Type of Therapy:  Wrap-up group  Participation Level:  Did Not Attend  Participation Quality:    Affect:    Cognitive:    Insight:    Engagement in Group:    Modes of Intervention:    Summary of Progress/Problems: PT refused to attend group.  Grayce LITTIE Essex 03/20/2024, 9:06 PM

## 2024-03-20 NOTE — Progress Notes (Signed)
   03/20/24 1600  Psych Admission Type (Psych Patients Only)  Admission Status Involuntary  Psychosocial Assessment  Patient Complaints Worrying  Eye Contact Fair  Facial Expression Flat  Affect Appropriate to circumstance  Speech Logical/coherent  Interaction Assertive  Motor Activity Slow  Appearance/Hygiene In scrubs  Behavior Characteristics Appropriate to situation  Mood Suspicious  Thought Process  Coherency WDL  Content Preoccupation  Delusions WDL  Perception WDL  Hallucination None reported or observed  Judgment Impaired  Confusion None  Danger to Self  Current suicidal ideation? Denies  Danger to Others  Danger to Others None reported or observed

## 2024-03-20 NOTE — [facility] Group Notes (Signed)
 Adult Psychoeducational Group Note  Date:  03/20/2024 Time:  7:50 PM  Group Topic/Focus:  Goals Group:   The focus of this group is to help patients establish daily goals to achieve during treatment and discuss how the patient can incorporate goal setting into their daily lives to aide in recovery. Orientation:   The focus of this group is to educate the patient on the purpose and policies of crisis stabilization and provide a format to answer questions about their admission.  The group details unit policies and expectations of patients while admitted.  Participation Level:  Did Not Attend  Participation Quality:    Affect:    Cognitive:    Insight:   Engagement in Group:    Modes of Intervention:    Additional Comments:    Sharnae Winfree O 03/20/2024, 7:50 PM

## 2024-03-20 NOTE — [facility] Suicide Risk Assessment (Signed)
 [facility] INPATIENT:  Family/Significant Other Suicide Prevention Education  Suicide Prevention Education:  Education Completed; Colleen Peck (mother & legal guardian),  (name of family member/significant other) has been identified by the patient as the family member/significant other with whom the patient will be residing, and identified as the person(s) who will aid the patient in the event of a mental health crisis (suicidal ideations/suicide attempt).  With written consent from the patient, the family member/significant other has been provided the following suicide prevention education, prior to the and/or following the discharge of the patient.  Colleen Peck confirmed patient will not have access to firearms/weapon/guns in the home after discharge to harm herself or others. Colleen Peck reported being willing to provide support should patient have a mental health crisis. Colleen Peck reported patient cannot transport herself to and from her appointments, so she will be able to provide transportation should patient need to make an appointment. Colleen Peck denied safety concerns regarding patient discharging home, but is worried that she won't follow through with outpatient treatment as discussed.  The suicide prevention education provided includes the following: Suicide risk factors Suicide prevention and interventions National Suicide Hotline telephone number [facility] assessment telephone number [facility] Emergency Assistance 911 [facility] and/or Residential Mobile Crisis Unit telephone number  Request made of family/significant other to: Remove weapons (e.g., guns, rifles, knives), all items previously/currently identified as safety concern.   Remove drugs/medications (over-the-counter, prescriptions, illicit drugs), all items previously/currently identified as a safety concern.  The family member/significant other verbalizes understanding of the suicide prevention education information provided.   The family member/significant other agrees to remove the items of safety concern listed above.  Colleen Peck M Colleen Peck, LCSWA 03/20/2024, 9:34 AM
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
## 2024-03-21 MED ORDER — ATORVASTATIN CALCIUM 10 MG PO TABS: 10.0000 mg | ORAL_TABLET | Freq: Every day | ORAL | 0 refills | Status: AC

## 2024-03-21 MED ORDER — HALOPERIDOL 5 MG PO TABS: 5.0000 mg | ORAL_TABLET | Freq: Two times a day (BID) | ORAL | 0 refills | Status: DC

## 2024-03-21 MED ORDER — LEVOTHYROXINE SODIUM 200 MCG PO TABS: 200.0000 ug | ORAL_TABLET | Freq: Every day | ORAL | 0 refills | Status: AC

## 2024-03-21 MED ORDER — BENZTROPINE MESYLATE 0.5 MG PO TABS: 0.5000 mg | ORAL_TABLET | Freq: Two times a day (BID) | ORAL | 0 refills | Status: DC

## 2024-03-21 MED ORDER — BENZTROPINE MESYLATE 0.5 MG PO TABS
0.5000 mg | ORAL_TABLET | Freq: Two times a day (BID) | ORAL | Status: DC
Start: 2024-03-21 — End: 2024-03-21
  Filled 2024-03-21: qty 14

## 2024-03-21 MED ORDER — LAMOTRIGINE 25 MG PO TABS: 25.0000 mg | ORAL_TABLET | Freq: Every day | ORAL | 0 refills | Status: DC

## 2024-03-21 MED ORDER — LAMOTRIGINE 25 MG PO TABS
25.0000 mg | ORAL_TABLET | Freq: Every day | ORAL | Status: DC
  Filled 2024-03-21: qty 7

## 2024-03-21 MED ORDER — HYDROXYZINE HCL 25 MG PO TABS: 25.0000 mg | ORAL_TABLET | Freq: Three times a day (TID) | ORAL | 0 refills | Status: DC | PRN

## 2024-03-21 NOTE — Progress Notes (Signed)
 Upon discharging, Nurse went over AVS educating on medications and follow up appointments. Suicide safety plan was completed. Reviewed with nurse and copy given to patient and placed in chart. Belongings sheet was signed and items returned to patient. Patient denies SI/HI (with no plan) and AVH. Voices no concerns to staff prior discharging off unit. Was safely walked out ride.

## 2024-03-21 NOTE — Group Note (Signed)
 Recreation Therapy Group Note   Group Topic:Goal Setting  Group Date: 03/21/2024 Start Time: 1028 End Time: 1045 Facilitators: Audrena Talaga-McCall, LRT,CTRS Location: [address] Dayroom   Group Topic: Goal Setting  Goal Area(s) Addresses:  Patient will participate in discussion of what a goal is. Patient will successfully identify goals they want to reach at different time frames.  Behavioral Response:   Intervention: Group Conversation, Worksheet  Activity: LRT and patients discussed what goals were. Patients were then given a worksheet were they identified goals they wanted to accomplish in a week, month, year and 5 years. Patient then had to identify any obstacles that would interfere with reaching those goals, what they would need to reach goals and what they can start doing now to work towards goals.  Education: Goal Setting  Education Outcome: Acknowledges education   Affect/Mood: N/A   Participation Level: Did not attend    Clinical Observations/Individualized Feedback:      Plan: Continue to engage patient in RT group sessions 2-3x/week.   Gaynor Genco-McCall, LRT,CTRS 03/21/2024 1:13 PM

## 2024-03-21 NOTE — [facility] Suicide Risk Assessment (Signed)
 Suicide Risk Assessment  Discharge Assessment    [facility] Discharge Suicide Risk Assessment   Principal Problem: Schizoaffective disorder [facility])  Discharge Diagnoses: Principal Problem:   Schizoaffective disorder (HCC) Active Problems:   PTSD (post-traumatic stress disorder)   GAD (generalized anxiety disorder)  Total Time spent with patient: 45 minutes  Musculoskeletal: Strength & Muscle Tone: within normal limits Gait & Station: normal Patient leans: N/A  Psychiatric Specialty Exam  Presentation  General Appearance:  Casual; Fairly Groomed  Eye Contact: Good  Speech: Clear and Coherent; Slow  Speech Volume: Decreased  Handedness: Right   Mood and Affect  Mood: Euthymic  Duration of Depression Symptoms: No data recorded Affect: Congruent   Thought Process  Thought Processes: Coherent; Linear  Descriptions of Associations:Intact  Orientation:Full (Time, Place and Person)  Thought Content:Logical  History of Schizophrenia/Schizoaffective disorder:Yes  Duration of Psychotic Symptoms:Greater than six months  Hallucinations:Hallucinations: None  Ideas of Reference:None  Suicidal Thoughts:Suicidal Thoughts: No  Homicidal Thoughts:Homicidal Thoughts: No  Sensorium  Memory: Immediate Good; Recent Good; Remote Good  Judgment: Fair  Insight: Fair  Art therapist  Concentration: Good  Attention Span: Good  Recall: Fair  Fund of Knowledge: Fair  Language: Good  Psychomotor Activity  Psychomotor Activity: Psychomotor Activity: Normal   Assets  Assets: Communication Skills; Desire for Improvement; Housing; Physical Health; Resilience; Social Support   Sleep  Sleep: Sleep: Good  Estimated Sleeping Duration (Last 24 Hours): 8.00-9.25 hours  Physical Exam: See H&P. Blood pressure 100/65, pulse 83, temperature 97.8 F (36.6 C), temperature source Oral, resp. rate 18, height 5' 6 (1.676 m), weight 97.5 kg, SpO2 100%.  Body mass index is 34.7 kg/m.  Mental Status Per Nursing Assessment::   On Admission:  Suicidal ideation indicated by patient  Demographic Factors:  Adolescent or young adult, Low socioeconomic status, and Unemployed  Loss Factors: NA  Historical Factors: NA  Risk Reduction Factors:   Sense of responsibility to family, Living with another person, especially a relative, Positive social support, Positive therapeutic relationship, and Positive coping skills or problem solving skills  Continued Clinical Symptoms:  Unstable or Poor Therapeutic Relationship Previous Psychiatric Diagnoses and Treatments  Cognitive Features That Contribute To Risk:  Thought constriction (tunnel vision)    Suicide Risk:  Minimal: No identifiable suicidal ideation.  Patients presenting with no risk factors but with morbid ruminations; may be classified as minimal risk based on the severity of the depressive symptoms   Follow-up Information     [facility].   Why: You may also call this provider at # [phone] to schedule an appointment for therapy services, as we were unable to contact prior to your discharge. Contact information: [address] [place] [address] [phone]         [facility] at [place]. Go on 04/02/2024.   Specialty: Behavioral Health Why: You have an appointment for medication management services on 04/02/24 at 12:50 pm, in person.  You have an appointment on 04/04/24 at 8:00 am for therapy services, in person.  * YOU MUST bring photo ID, guardianship paperwork, and your insurance card/s with you. Contact information: [address] [place]  [address] [phone]               Plan Of Care/Follow-up recommendations:  See the discharge recommendation above.  Mac Bolster, NP, pmhnp, fnp-bc. 03/21/2024, 11:26 AM

## 2024-03-21 NOTE — Progress Notes (Signed)
  [facility] Adult Case Management Discharge Plan :  Will you be returning to the same living situation after discharge:  Yes,  pt will be returning to her mother's home after discharge.  At discharge, do you have transportation home?: Yes,  pt's mother to provide transportation at discharge.  Do you have the ability to pay for your medications: Yes,  pt is insured - Medicare Part A & B.  Release of information consent forms completed and in the chart;  Patient's signature needed at discharge.  Patient to Follow up at:  Follow-up Information     [facility] Follow [facility].   Why: You may also call this provider at # [phone] to schedule an appointment for therapy services, as we were unable to contact prior to your discharge. Contact information: [address] [place] [address] [phone]         [facility] at [place]. Go on 04/02/2024.   Specialty: Behavioral Health Why: You have an appointment for medication management services on 04/02/24 at 12:50 pm, in person.  You have an appointment on 04/04/24 at 8:00 am for therapy services, in person.  * YOU MUST bring photo ID, guardianship paperwork, and your insurance card/s with you. Contact information: [address] [place]  [address] [phone]                Next level of care provider has access to [facility] Link:no  Safety Planning and Suicide Prevention discussed: Yes,  completed with Saylor Murry (mother & legal guardian) [phone]     Has patient been referred to the Quitline?: Patient does not use tobacco/nicotine products  Patient has been referred for addiction treatment: No known substance use disorder.  Shakemia Madera M Thaddeus Evitts, LCSWA 03/21/2024, 9:07 AM
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
## 2024-03-28 NOTE — Telephone Encounter (Signed)
 Patient returned call  [phone]

## 2024-03-28 NOTE — Telephone Encounter (Signed)
 She states she is having vaginal pain and wanted to schedule office visit with Elberta .  She was scheduled for 04/02/24 @ 10:00am (due to her schedule)

## 2024-03-28 NOTE — Telephone Encounter (Signed)
 Copied from CRM #[id number]. Topic: Schedule Appointment - Schedule Patient >> Mar 28, 2024 11:49 AM [place] KIDD wrote: Colleen Peck is calling other request    Include all details related to the request(s) below: Patient states that she is having vaginal pain.  Does not have an OBGYN at the moment.  Would like a call back to schedule.     Confirm and type the Best Contact Number below:  Patient/caller contact number:      [phone]       [] Home  [x] Mobile  [] Work [] Other   [] Okay to leave a voicemail   Medication List:  Current Outpatient Medications:  .  acetaminophen  (TYLENOL ) 500 mg tablet, Take 1,000 mg by mouth every 6 (six) hours as needed. (Patient not taking: Reported on 08/04/2023), Disp: , Rfl:  .  albuterol  1.25 mg/3 mL nebulizer solution, Take 2.5 mg by nebulization 3 (three) times a day. (Patient not taking: Reported on 08/04/2023), Disp: , Rfl:  .  albuterol  1.25 mg/3 mL nebulizer solution, Take 1 vial by nebulization every 6 (six) hours as needed. (Patient not taking: Reported on 08/04/2023), Disp: , Rfl:  .  albuterol  HFA (PROVENTIL  HFA;VENTOLIN  HFA;PROAIR  HFA) 90 mcg/actuation inhaler, Inhale 2 puffs every 4 (four) hours as needed. (Patient not taking: Reported on 08/04/2023), Disp: , Rfl:  .  ARIPiprazole  (ABILIFY ) 10 mg tablet, , Disp: , Rfl:  .  ciprofloxacin  (CIPRO ) 500 mg tablet, Take one tablet twice daily for two weeks (Patient not taking: Reported on 08/04/2023), Disp: 28 tablet, Rfl: 0 .  ergocalciferol  (VITAMIN D2) 1,250 mcg (50,000 unit) capsule, TAKE 1 CAPSULE BY MOUTH EVERY 7 DAYS (Patient not taking: Reported on 08/04/2023), Disp: 12 capsule, Rfl: 0 .  fluticasone  propionate (FLONASE ) 50 mcg/spray nasal spray, 1 spray Once Daily. (Patient not taking: Reported on 08/04/2023), Disp: , Rfl:  .  gentamicin (GARAMYCIN) 0.1 % ointment, Apply to the toenail daily. (Patient not taking: Reported on 08/04/2023), Disp: 30 g, Rfl: 5 .  hydrOXYzine  pamoate (VISTARIL ) 25  mg capsule, TAKE 1 CAPSULE BY MOUTH THREE TIMES DAILY AS NEEDED FOR ANXIETY (Patient not taking: Reported on 08/04/2023), Disp: , Rfl:  .  levothyroxine  (SYNTHROID ) 200 mcg tablet, TAKE 1 TABLET(200 MCG) BY MOUTH DAILY (Patient not taking: Reported on 08/04/2023), Disp: 30 tablet, Rfl: 1 .  levothyroxine  (SYNTHROID ) 200 mcg tablet, Take 1 tablet (200 mcg total) by mouth in the morning., Disp: 90 tablet, Rfl: 1 .  meclizine  (ANTIVERT ) 25 mg tablet, Take 25 mg by mouth 3 (three) times a day as needed. (Patient not taking: Reported on 08/04/2023), Disp: 30 tablet, Rfl: 0 .  montelukast  (SINGULAIR ) 10 mg tablet, Take 10 mg by mouth Once Daily. (Patient not taking: Reported on 08/04/2023), Disp: 90 tablet, Rfl: 1 .  mupirocin (BACTROBAN) 2 % ointment, Apply topically to wound twice daily for 7 days (Patient not taking: Reported on 08/04/2023), Disp: 22 g, Rfl: 0 .  ondansetron  (ZOFRAN -ODT) 4 mg disintegrating tablet, DISSOLVE 1 TABLET(4 MG) ON THE TONGUE EVERY 8 HOURS AS NEEDED FOR NAUSEA (Patient not taking: Reported on 08/04/2023), Disp: 20 tablet, Rfl: 0 .  potassium chloride  (KLOR-CON ) 20 mEq ER tablet, Take 20 mEq by mouth 2 (two) times a day. (Patient not taking: Reported on 08/04/2023), Disp: , Rfl:  .  RABEprazole (ACIPHEX) 20 mg DR tablet, Take 20 mg by mouth Once Daily for 90 doses., Disp: 90 tablet, Rfl: 0 .  triamcinolone  acetonide (KENALOG ) 0.1 % ointment, Apply topically 2 (two)  times a day., Disp: 453.6 g, Rfl: 0     Medication Request/Refills: Pharmacy Information (if applicable)   [x] Not Applicable       []  Pharmacy listed  Send Medication Request to:                                                 [] Pharmacy not listed (added to pharmacy list in Epic) Send Medication Request to:      Listed Pharmacies: [id number] [place], [place] - [address] AT [facility] OF [address] & [facility] [address] - PHONE: [phone] - FAX: [phone]

## 2024-03-28 NOTE — Telephone Encounter (Signed)
 done
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
## 2024-04-02 ENCOUNTER — Ambulatory Visit (HOSPITAL_COMMUNITY): Admitting: Psychiatry

## 2024-04-04 ENCOUNTER — Ambulatory Visit (HOSPITAL_COMMUNITY): Admitting: Licensed Clinical Social Worker

## 2024-04-04 ENCOUNTER — Ambulatory Visit (HOSPITAL_BASED_OUTPATIENT_CLINIC_OR_DEPARTMENT_OTHER): Admitting: Psychiatry

## 2024-04-04 VITALS — BP 97/80 | HR 86 | Wt 229.2 lb

## 2024-04-04 DIAGNOSIS — F209 Schizophrenia, unspecified: Secondary | ICD-10-CM | POA: Diagnosis not present

## 2024-04-04 DIAGNOSIS — F4310 Post-traumatic stress disorder, unspecified: Secondary | ICD-10-CM | POA: Diagnosis not present

## 2024-04-04 DIAGNOSIS — F411 Generalized anxiety disorder: Secondary | ICD-10-CM | POA: Diagnosis not present

## 2024-04-04 MED ORDER — HYDROXYZINE HCL 25 MG PO TABS: 25.0000 mg | ORAL_TABLET | Freq: Three times a day (TID) | ORAL | 2 refills | Status: DC | PRN

## 2024-04-04 MED ORDER — HALOPERIDOL DECANOATE 100 MG/ML IM SOLN: 100.0000 mg | INTRAMUSCULAR | 12 refills | Status: DC

## 2024-04-04 MED ORDER — TRAZODONE HCL 50 MG PO TABS: 50.0000 mg | ORAL_TABLET | Freq: Every day | ORAL | 2 refills | Status: DC
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
## 2024-04-06 MED ORDER — HALOPERIDOL DECANOATE 100 MG/ML IM SOLN
100.0000 mg | INTRAMUSCULAR | Status: DC
  Administered 2024-04-10 – 2024-07-03 (×4): 100 mg via INTRAMUSCULAR
  Administered 2024-07-30: 50 mg via INTRAMUSCULAR

## 2024-04-06 NOTE — Addendum Note (Signed)
 Addended by: GRAHAM KRABBE on: 04/06/2024 07:46 AM   Modules accepted: Orders

## 2024-04-10 ENCOUNTER — Ambulatory Visit (HOSPITAL_BASED_OUTPATIENT_CLINIC_OR_DEPARTMENT_OTHER)

## 2024-04-10 VITALS — BP 123/81 | HR 72 | Ht 66.5 in | Wt 229.0 lb

## 2024-04-10 DIAGNOSIS — F209 Schizophrenia, unspecified: Secondary | ICD-10-CM | POA: Diagnosis not present

## 2024-04-10 NOTE — Progress Notes (Signed)
 Patient arrived today for her due injection of Haloperidol  Dec 100 mg/ mL. Patient was well groomed with an appropriate affect. Patient denies any SI/HI or AVH. Injection was prepared as ordered and administered in patients LD. Patient tolerated well and without complaint, she will return in 28 days for her next due injection     NDC: 72603-230-01 LOT: [id number] EXP: 2027 / APR

## 2024-04-25 ENCOUNTER — Encounter (HOSPITAL_COMMUNITY): Payer: Self-pay | Admitting: Psychiatry

## 2024-04-29 ENCOUNTER — Other Ambulatory Visit: Payer: Self-pay

## 2024-04-29 ENCOUNTER — Emergency Department (HOSPITAL_COMMUNITY)

## 2024-04-29 ENCOUNTER — Encounter (HOSPITAL_COMMUNITY): Payer: Self-pay

## 2024-04-29 ENCOUNTER — Emergency Department (HOSPITAL_COMMUNITY)
Admission: EM | Admit: 2024-04-29 | Discharge: 2024-04-30 | Disposition: A | Attending: Emergency Medicine | Admitting: Emergency Medicine

## 2024-04-29 DIAGNOSIS — R1030 Lower abdominal pain, unspecified: Secondary | ICD-10-CM | POA: Diagnosis present

## 2024-04-29 DIAGNOSIS — D219 Benign neoplasm of connective and other soft tissue, unspecified: Secondary | ICD-10-CM

## 2024-04-29 DIAGNOSIS — J45909 Unspecified asthma, uncomplicated: Secondary | ICD-10-CM | POA: Insufficient documentation

## 2024-04-29 DIAGNOSIS — D259 Leiomyoma of uterus, unspecified: Secondary | ICD-10-CM | POA: Diagnosis not present

## 2024-04-29 DIAGNOSIS — Z91040 Latex allergy status: Secondary | ICD-10-CM | POA: Diagnosis not present

## 2024-04-29 DIAGNOSIS — E039 Hypothyroidism, unspecified: Secondary | ICD-10-CM | POA: Diagnosis not present

## 2024-04-29 LAB — URINALYSIS, W/ REFLEX TO CULTURE (INFECTION SUSPECTED)
Bacteria, UA: NONE SEEN
Bilirubin Urine: NEGATIVE
Glucose, UA: NEGATIVE mg/dL
Hgb urine dipstick: NEGATIVE
Ketones, ur: NEGATIVE mg/dL
Leukocytes,Ua: NEGATIVE
Nitrite: NEGATIVE
Protein, ur: NEGATIVE mg/dL
Specific Gravity, Urine: 1.014 (ref 1.005–1.030)
pH: 5 (ref 5.0–8.0)

## 2024-04-29 LAB — CBC WITH DIFFERENTIAL/PLATELET
Abs Immature Granulocytes: 0.02 K/uL (ref 0.00–0.07)
Basophils Absolute: 0 K/uL (ref 0.0–0.1)
Basophils Relative: 1 %
Eosinophils Absolute: 0.2 K/uL (ref 0.0–0.5)
Eosinophils Relative: 3 %
HCT: 31.4 % — ABNORMAL LOW (ref 36.0–46.0)
Hemoglobin: 9.6 g/dL — ABNORMAL LOW (ref 12.0–15.0)
Immature Granulocytes: 0 %
Lymphocytes Relative: 34 %
Lymphs Abs: 2 K/uL (ref 0.7–4.0)
MCH: 26.2 pg (ref 26.0–34.0)
MCHC: 30.6 g/dL (ref 30.0–36.0)
MCV: 85.8 fL (ref 80.0–100.0)
Monocytes Absolute: 0.4 K/uL (ref 0.1–1.0)
Monocytes Relative: 7 %
Neutro Abs: 3.3 K/uL (ref 1.7–7.7)
Neutrophils Relative %: 55 %
Platelets: 304 K/uL (ref 150–400)
RBC: 3.66 MIL/uL — ABNORMAL LOW (ref 3.87–5.11)
RDW: 15.3 % (ref 11.5–15.5)
WBC: 5.9 K/uL (ref 4.0–10.5)
nRBC: 0 % (ref 0.0–0.2)

## 2024-04-29 LAB — COMPREHENSIVE METABOLIC PANEL WITH GFR
ALT: 16 U/L (ref 0–44)
AST: 18 U/L (ref 15–41)
Albumin: 3.5 g/dL (ref 3.5–5.0)
Alkaline Phosphatase: 47 U/L (ref 38–126)
Anion gap: 8 (ref 5–15)
BUN: 9 mg/dL (ref 6–20)
CO2: 26 mmol/L (ref 22–32)
Calcium: 9.4 mg/dL (ref 8.9–10.3)
Chloride: 102 mmol/L (ref 98–111)
Creatinine, Ser: 0.77 mg/dL (ref 0.44–1.00)
GFR, Estimated: >60 mL/min (ref >60)
Glucose, Bld: 90 mg/dL (ref 70–99)
Potassium: 4.2 mmol/L (ref 3.5–5.1)
Sodium: 136 mmol/L (ref 135–145)
Total Bilirubin: 0.3 mg/dL (ref 0.0–1.2)
Total Protein: 6.9 g/dL (ref 6.5–8.1)

## 2024-04-29 LAB — PREGNANCY, URINE: Preg Test, Ur: NEGATIVE

## 2024-04-29 MED ORDER — KETOROLAC TROMETHAMINE 15 MG/ML IJ SOLN
15.0000 mg | Freq: Once | INTRAMUSCULAR | Status: AC
Start: 2024-04-29 — End: 2024-04-29
  Administered 2024-04-29: 15 mg via INTRAVENOUS
  Filled 2024-04-29: qty 1

## 2024-04-29 NOTE — ED Notes (Signed)
 US  called and stated pt has been with them for the last few hours.  Pt returned to waiting room.

## 2024-04-29 NOTE — ED Triage Notes (Signed)
 Pt here from home with c/ lower abd pain that has been ongoing for months

## 2024-04-29 NOTE — ED Notes (Signed)
Pt called [facility] to go to a room. Pt could not be found.  

## 2024-04-29 NOTE — ED Provider Triage Note (Signed)
 Emergency Medicine Provider Triage Evaluation Note  Colleen Peck , a 43 y.o. female  was evaluated in triage.  Pt complains of pain in the vaginal area.  She reports she feels a lump and discomfort in the area without significant discharge.  She is also having some dysuria.  Last sexually active years ago  Review of Systems  Positive: Vaginal and uterine pain as well as suprapubic pain Negative: Normal menses, no fever, nausea or vomiting  Physical Exam  BP 108/70 (BP Location: Right Arm)   Pulse 78   Temp 98.6 F (37 C)   Resp 20   Ht 5' 6.5 (1.689 m)   Wt 103.9 kg   SpO2 96%   BMI 36.42 kg/m  Gen:   Awake, no distress   Resp:  Normal effort  MSK:   Moves extremities without difficulty  Other:  Suprapubic discomfort  Medical Decision Making  Medically screening exam initiated at 6:03 PM.  Appropriate orders placed.  Colleen Peck was informed that the remainder of the evaluation will be completed by another provider, this initial triage assessment does not replace that evaluation, and the importance of remaining in the ED until their evaluation is complete.     Doretha Folks, MD 04/29/24 [phone]

## 2024-04-29 NOTE — ED Provider Notes (Signed)
 [facility] AT [facility] Provider Note   CSN: [id number] Arrival date & time: 04/29/24  1729     Patient presents with: Abdominal Pain   Colleen Peck is a 43 y.o. female.  {Add pertinent medical, surgical, social history, OB history to YEP:67052} Patient has a history of asthma, depression, PTSD, bipolar disorder, hypothyroidism here with lower abdominal pain.  Reports suprapubic and uterine pain ongoing for several months.  Pain worse when she is not having her menses.  No bleeding or discharge now.  Was treated for yeast infection several weeksz ago.  No bleeding or discharge currently.  Some pain with urination.  No blood in the urine.  No fever or vomiting.  Diffuse crampy lower abdominal pain is worse with palpation and movement.  Still has appendix and gallbladder.  Does have pain with urination but no blood in the urine.  No vaginal discharge or bleeding.  No chest pain or shortness of breath.  Still has appendix and gallbladder.  Taking Tylenol  for this pain at home without relief.  Believes this pain is from her uterus and fibroids.  The history is provided by the patient.  Abdominal Pain Associated symptoms: no chest pain, no dysuria, no fever, no hematuria, no nausea, no shortness of breath and no vomiting        Prior to Admission medications   Medication Sig Start Date End Date Taking? Authorizing Provider  albuterol  (VENTOLIN  HFA) 108 (90 Base) MCG/ACT inhaler INHALE 2 PUFFS INTO THE LUNGS EVERY 4 HOURS AS NEEDED FOR WHEEZING OR SHORTNESS OF BREATH 12/03/20   Chandra Toribio POUR, MD  atorvastatin  (LIPITOR) 10 MG tablet Take 1 tablet (10 mg total) by mouth at bedtime. For high cholesterol 03/21/24   Nwoko, Mac I, NP  haloperidol  decanoate (HALDOL  DECANOATE) 100 MG/ML injection Inject 1 mL (100 mg total) into the muscle every 28 (twenty-eight) days. Patient's first oupatient dose on 04/10/24. Please deliver to [address] [place], [address]  [place] [address]. 04/10/24   Graham Krabbe, MD  hydrOXYzine  (ATARAX ) 25 MG tablet Take 1 tablet (25 mg total) by mouth 3 (three) times daily as needed for anxiety. 04/04/24 07/03/24  Chien, Stephanie, MD  levothyroxine  (SYNTHROID ) 200 MCG tablet Take 1 tablet (200 mcg total) by mouth daily at 6 (six) AM. For low functioning thyroid  gland. 03/21/24   Collene Mac I, NP  traZODone  (DESYREL ) 50 MG tablet Take 1 tablet (50 mg total) by mouth at bedtime. 04/04/24 07/03/24  Graham Krabbe, MD    Allergies: Abilify  [aripiprazole ], Kiwi extract, Latex, Porcine (pork) protein-containing drug products, and Meloxicam     Review of Systems  Constitutional:  Negative for activity change, appetite change and fever.  HENT:  Negative for congestion and rhinorrhea.   Respiratory:  Negative for chest tightness and shortness of breath.   Cardiovascular:  Negative for chest pain.  Gastrointestinal:  Positive for abdominal pain. Negative for nausea and vomiting.  Genitourinary:  Negative for dysuria and hematuria.  Musculoskeletal:  Negative for arthralgias and myalgias.  Skin:  Negative for rash.  Neurological:  Negative for dizziness, weakness and headaches.   all other systems are negative except as noted in the HPI and PMH.    Updated Vital Signs BP 108/70 (BP Location: Right Arm)   Pulse 74   Temp 98.6 F (37 C)   Resp 20   Ht 5' 6.5 (1.689 m)   Wt 103.9 kg   SpO2 100%   BMI 36.42 kg/m  Physical Exam Vitals reviewed.  Constitutional:      General: She is not in acute distress.    Appearance: She is well-developed.  HENT:     Head: Normocephalic and atraumatic.     Mouth/Throat:     Pharynx: No oropharyngeal exudate.  Eyes:     Conjunctiva/sclera: Conjunctivae normal.     Pupils: Pupils are equal, round, and reactive to light.  Neck:     Comments: No meningismus. Cardiovascular:     Rate and Rhythm: Normal rate and regular rhythm.     Heart sounds: Normal heart sounds. No murmur  heard. Pulmonary:     Effort: Pulmonary effort is normal. No respiratory distress.     Breath sounds: Normal breath sounds.  Abdominal:     Palpations: Abdomen is soft.     Tenderness: There is abdominal tenderness. There is no guarding or rebound.     Comments: Suprapubic tenderness. No guarding or rebound  Genitourinary:    Comments: Chaperone present.  Normal external genitalia. No CMT.  No lateralizing muscle tenderness Musculoskeletal:        General: No tenderness. Normal range of motion.     Cervical back: Normal range of motion and neck supple.     Comments: No CVAT  Skin:    General: Skin is warm.  Neurological:     Mental Status: She is alert and oriented to person, place, and time.     Cranial Nerves: No cranial nerve deficit.     Motor: No abnormal muscle tone.     Coordination: Coordination normal.     Comments:  5/5 strength throughout. CN 2-12 intact.Equal grip strength.   Psychiatric:        Behavior: Behavior normal.     (all labs ordered are listed, but only abnormal results are displayed) Labs Reviewed  CBC WITH DIFFERENTIAL/PLATELET - Abnormal; Notable for the following components:      Result Value   RBC 3.66 (*)    Hemoglobin 9.6 (*)    HCT 31.4 (*)    All other components within normal limits  COMPREHENSIVE METABOLIC PANEL WITH GFR  URINALYSIS, W/ REFLEX TO CULTURE (INFECTION SUSPECTED)  PREGNANCY, URINE    EKG: None  Radiology: US  PELVIC COMPLETE W TRANSVAGINAL AND TORSION R/O Result Date: 04/29/2024 EXAM: US  Pelvis, Complete Transvaginal and Transabdominal with Doppler 04/29/2024 10:55:55 PM TECHNIQUE: Transabdominal and transvaginal pelvic duplex ultrasound using B-mode/gray scaled imaging with Doppler spectral analysis and color flow was obtained. COMPARISON: ct renal 04/09/22 CLINICAL HISTORY: Pelvic pain [id number]; [id number] Pain of ovary [id number]. Last menstrual period: 1 week ago. FINDINGS: UTERUS: Uterus measures 6.8 x 4.2 x 5.9 cm, volume 88 ml.  Uterus demonstrates normal myometrial echotexture. ENDOMETRIAL STRIPE: Endometrial measures 8.5 cm. RIGHT OVARY: Right ovary measures 2.9 x 1.9 x 2.7 cm, volume 7.7 ml. Right ovary is within normal limits. There is normal arterial and venous Doppler flow. LEFT OVARY: Left ovary measures 3 x 1.4 x 1.9 cm, volume 8.1 ml. Left ovary is within normal limits. There is normal arterial and venous Doppler flow. FREE FLUID: No free fluid. IMPRESSION: 1. Uterine fibroids. Electronically signed by: Morgane Naveau MD 04/29/2024 11:05 PM EDT RP Workstation: [id number]    {Document cardiac monitor, telemetry assessment procedure when appropriate:32947} Procedures   Medications Ordered in the ED  ketorolac  (TORADOL ) 15 MG/ML injection 15 mg (has no administration in time range)      {Click here for ABCD2, HEART and other calculators REFRESH Note before signing:1}  Medical Decision Making Amount and/or Complexity of Data Reviewed Labs: ordered. Decision-making details documented in ED Course. Radiology: ordered and independent interpretation performed. Decision-making details documented in ED Course. ECG/medicine tests: ordered and independent interpretation performed. Decision-making details documented in ED Course.  Risk Prescription drug management.   Lower abdominal/uterine pain for several months. No vaginal bleeding or discharge now. HCG negative.   Ultrasound was obtained in triage but shows no findings.  No uterine fibroids.  No evidence of ovarian torsion.  {Document critical care time when appropriate  Document review of labs and clinical decision tools ie CHADS2VASC2, etc  Document your independent review of radiology images and any outside records  Document your discussion with family members, caretakers and with consultants  Document social determinants of health affecting pt's care  Document your decision making why or why not admission, treatments were  needed:32947:::1}   Final diagnoses:  None    ED Discharge Orders     None

## 2024-04-30 ENCOUNTER — Emergency Department (HOSPITAL_COMMUNITY)

## 2024-04-30 LAB — WET PREP, GENITAL
Clue Cells Wet Prep HPF POC: NONE SEEN
Sperm: NONE SEEN
Trich, Wet Prep: NONE SEEN
WBC, Wet Prep HPF POC: <10 (ref <10)
Yeast Wet Prep HPF POC: NONE SEEN

## 2024-04-30 LAB — GC/CHLAMYDIA PROBE AMP (~~LOC~~) NOT AT ARMC
Chlamydia: NEGATIVE
Comment: NEGATIVE
Comment: NORMAL
Neisseria Gonorrhea: NEGATIVE

## 2024-04-30 LAB — LIPASE, BLOOD: Lipase: 25 U/L (ref 11–51)

## 2024-04-30 MED ORDER — IOHEXOL 350 MG/ML SOLN
75.0000 mL | Freq: Once | INTRAVENOUS | Status: AC | PRN
  Administered 2024-04-30: 75 mL via INTRAVENOUS

## 2024-04-30 MED ORDER — NAPROXEN 500 MG PO TABS: 500.0000 mg | ORAL_TABLET | Freq: Two times a day (BID) | ORAL | 0 refills | Status: AC | PRN

## 2024-04-30 NOTE — Discharge Instructions (Signed)
 Your testing is reassuring.  We suspect your pain is likely from uterine fibroids.  Follow-up with your PCP as well as gynecologist.  Return to the ED with new or worsening symptoms.

## 2024-05-08 ENCOUNTER — Ambulatory Visit (HOSPITAL_COMMUNITY)

## 2024-05-08 ENCOUNTER — Other Ambulatory Visit: Payer: Self-pay

## 2024-05-08 ENCOUNTER — Encounter (HOSPITAL_COMMUNITY): Payer: Self-pay

## 2024-05-08 VITALS — BP 103/70 | HR 86 | Ht 66.5 in | Wt 234.0 lb

## 2024-05-08 DIAGNOSIS — F209 Schizophrenia, unspecified: Secondary | ICD-10-CM | POA: Diagnosis not present

## 2024-05-08 NOTE — Progress Notes (Signed)
 Patient arrives today for her due injection of Haloperidol  Decanoate 100 mg/mL. Patient presents well groomed with an appropriate affect. Patient needs to contact [facility] and I did explain this to her. She gave me a new insurance card and I will forward that to the pharmacy. Patient had some concerns for muscle stiffness, she spoke with our nurse Olivia briefly and was told to give us  a call if she is continuing to have this symptom. Patient has a follow up on 11/4 in office. Patient also states she feels no different since starting the medication, she is still depressed. Patient denies SI/HI or AVH. The injection was prepared as ordered and administered in the patients Right Deltoid. Patient tolerated well and without complaint. We will see her for her appointment on 11/4.   NDC: 72603-230-01 LOT: [id number] EXP: 2027/APR

## 2024-05-09 ENCOUNTER — Ambulatory Visit (HOSPITAL_COMMUNITY)
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
## 2024-05-14 ENCOUNTER — Ambulatory Visit (HOSPITAL_COMMUNITY): Admitting: Psychiatry

## 2024-05-15 ENCOUNTER — Telehealth (HOSPITAL_COMMUNITY): Payer: Self-pay | Admitting: Psychiatry

## 2024-05-15 ENCOUNTER — Ambulatory Visit (HOSPITAL_COMMUNITY)
Admission: EM | Admit: 2024-05-15 | Discharge: 2024-05-15 | Disposition: A | Discharge status: Home / Self Care | Attending: Psychiatry | Admitting: Psychiatry

## 2024-05-15 DIAGNOSIS — F4329 Adjustment disorder with other symptoms: Secondary | ICD-10-CM | POA: Diagnosis not present

## 2024-05-15 DIAGNOSIS — Z62810 Personal history of physical and sexual abuse in childhood: Secondary | ICD-10-CM | POA: Insufficient documentation

## 2024-05-15 DIAGNOSIS — F4325 Adjustment disorder with mixed disturbance of emotions and conduct: Secondary | ICD-10-CM | POA: Insufficient documentation

## 2024-05-15 DIAGNOSIS — E039 Hypothyroidism, unspecified: Secondary | ICD-10-CM | POA: Insufficient documentation

## 2024-05-15 DIAGNOSIS — F4310 Post-traumatic stress disorder, unspecified: Secondary | ICD-10-CM | POA: Insufficient documentation

## 2024-05-15 DIAGNOSIS — Z560 Unemployment, unspecified: Secondary | ICD-10-CM | POA: Insufficient documentation

## 2024-05-15 DIAGNOSIS — F319 Bipolar disorder, unspecified: Secondary | ICD-10-CM | POA: Diagnosis not present
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
## 2024-05-15 NOTE — Telephone Encounter (Signed)
 Patient had cancelled appointment earlier this week due to being sick. Received message from front desk today that patient wanted an earlier appointment. Called patient at [phone]. Patient reported that she was in a crisis, reported that she was having increasing ugly thoughts, not wanting to be alive. She denied having an active plan. She reports feeling overwhelmed with her relationship with her mom and with her studies. She reports that she is currently at the urgent care awaiting evaluation. Discussed that she is in the appropriate place for safety concerns and more thorough evaluation.
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
## 2024-05-15 NOTE — Discharge Instructions (Addendum)
 Please call the resources above and schedule a therapy appointment to learn coping strategies for your ongoing stressors. Get help right away if: You have thoughts about hurting yourself or others. Get help right away if you feel like you may hurt yourself or others, or have thoughts about taking your own life. Go to your nearest emergency room or: Call 911. Call the National Suicide Prevention Lifeline at 670-406-4293 or 988 in the [place] This is open 24 hours a day. If you're a Veteran: Call 988 and press 1. This is open 24 hours a day. Text the [employer] at 779-565-3379. Summary Mental health is not just the absence of mental illness. It involves understanding your emotions and behaviors, and taking steps to manage them in a healthy way. If you have symptoms of mental or emotional distress, get help from family, friends, a health care provider, or a mental health professional. Practice good mental health behaviors such as stress management skills, self-calming skills, exercise, healthy sleeping and eating, and supportive relationships. This information is not intended to replace advice given to you by your health care provider. Make sure you discuss any questions you have with your health care provider.  Education provided on the fact that if experiencing worsening of psychiatry symptoms including suicidal ideations, homicidal ideations, or having auditory/visual hallucinations, etc, to call 911, 988, come back to this location, or go to the nearest ER.  Pt verbalized understanding.

## 2024-05-21 ENCOUNTER — Ambulatory Visit (HOSPITAL_COMMUNITY): Admitting: Psychiatry

## 2024-05-21 DIAGNOSIS — F209 Schizophrenia, unspecified: Secondary | ICD-10-CM | POA: Diagnosis not present

## 2024-05-21 DIAGNOSIS — F411 Generalized anxiety disorder: Secondary | ICD-10-CM

## 2024-05-21 DIAGNOSIS — F4310 Post-traumatic stress disorder, unspecified: Secondary | ICD-10-CM

## 2024-05-21 MED ORDER — ESCITALOPRAM OXALATE 10 MG PO TABS: 10.0000 mg | ORAL_TABLET | Freq: Every day | ORAL | 0 refills | Status: DC

## 2024-05-23 NOTE — Addendum Note (Signed)
 Addended by: CARVIN CROCK on: 05/23/2024 10:48 AM   Modules accepted: Level of Service

## 2024-05-28 ENCOUNTER — Ambulatory Visit (HOSPITAL_COMMUNITY): Admitting: Psychiatry

## 2024-05-29 ENCOUNTER — Telehealth (HOSPITAL_COMMUNITY): Payer: Self-pay | Admitting: *Deleted

## 2024-05-29 NOTE — Telephone Encounter (Signed)
 Pt requests refill of the Hydroxyzine  25 mg TID PRN.   Last visit: 05/21/24 Next visit: 06/18/24

## 2024-06-04 ENCOUNTER — Ambulatory Visit (HOSPITAL_COMMUNITY)

## 2024-06-04 VITALS — Ht 66.5 in | Wt 238.0 lb

## 2024-06-04 DIAGNOSIS — F209 Schizophrenia, unspecified: Secondary | ICD-10-CM

## 2024-06-04 NOTE — Progress Notes (Signed)
 Patient came in today for her injection, [facility] did not ship it. I called [facility] and they can have it here tomorrow at 10:30 am, patient will return tomorrow.

## 2024-06-05 ENCOUNTER — Ambulatory Visit (HOSPITAL_COMMUNITY)

## 2024-06-05 VITALS — BP 116/79 | HR 71 | Ht 67.0 in | Wt 240.0 lb

## 2024-06-05 DIAGNOSIS — F209 Schizophrenia, unspecified: Secondary | ICD-10-CM | POA: Diagnosis not present

## 2024-06-05 NOTE — Progress Notes (Signed)
 Patient arrived today for her due injection of Haloperidol  Dec 100 mg. Patient presents well groomed with an appropriate affect. Patients medication was not here so she ran to [facility] to pick it up, it will be delivered next month. Patient said she is doing well, denies HI/SI or AVH. Injection was prepared as ordered and administered in patients LD. Patient tolerated well and without complaint, she will return in 30 days for her next injection     NDC: 72603-230-01 LOT: [id number] EXP: 2027 / APR
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
## 2024-06-18 ENCOUNTER — Ambulatory Visit (HOSPITAL_COMMUNITY): Admitting: Psychiatry

## 2024-06-18 DIAGNOSIS — F209 Schizophrenia, unspecified: Secondary | ICD-10-CM | POA: Diagnosis not present

## 2024-06-18 DIAGNOSIS — F331 Major depressive disorder, recurrent, moderate: Secondary | ICD-10-CM

## 2024-06-18 DIAGNOSIS — F4310 Post-traumatic stress disorder, unspecified: Secondary | ICD-10-CM | POA: Diagnosis not present

## 2024-06-18 DIAGNOSIS — F411 Generalized anxiety disorder: Secondary | ICD-10-CM | POA: Diagnosis not present

## 2024-06-18 MED ORDER — HYDROXYZINE HCL 25 MG PO TABS: 25.0000 mg | ORAL_TABLET | Freq: Three times a day (TID) | ORAL | 2 refills | Status: DC | PRN

## 2024-06-18 MED ORDER — ESCITALOPRAM OXALATE 20 MG PO TABS: 20.0000 mg | ORAL_TABLET | Freq: Every day | ORAL | 0 refills | Status: DC

## 2024-06-18 MED ORDER — TRAZODONE HCL 50 MG PO TABS: 50.0000 mg | ORAL_TABLET | Freq: Every evening | ORAL | 0 refills | Status: DC | PRN

## 2024-06-18 NOTE — Addendum Note (Signed)
 Addended by: CARVIN CROCK on: 06/18/2024 01:05 PM   Modules accepted: Level of Service

## 2024-07-03 ENCOUNTER — Ambulatory Visit (HOSPITAL_BASED_OUTPATIENT_CLINIC_OR_DEPARTMENT_OTHER)

## 2024-07-03 VITALS — BP 109/73 | HR 90 | Ht 66.0 in | Wt 243.0 lb

## 2024-07-03 DIAGNOSIS — F209 Schizophrenia, unspecified: Secondary | ICD-10-CM

## 2024-07-03 NOTE — Progress Notes (Signed)
 Patient arrived today for her due injection of Haloperidol  Dec 100 mg. Patients presents well groomed with appropriate affect. Patient medication was not here but was sent to [facility] though [place] stated will be delivered this month. Patient says she is doing well, denies HI/SI/AVH. Injection was prepared as ordered and administered in patient's RD under supervision of Clive Mayer, CMA. Patient tolerated injection well and without complaint, will return in 28 days for next injection.   NDC: [address]-01 EXP: 2027/ JUL ONU:[id number]

## 2024-07-09 ENCOUNTER — Ambulatory Visit (HOSPITAL_COMMUNITY)

## 2024-07-09 DIAGNOSIS — F259 Schizoaffective disorder, unspecified: Secondary | ICD-10-CM

## 2024-07-09 DIAGNOSIS — F209 Schizophrenia, unspecified: Secondary | ICD-10-CM

## 2024-07-10 NOTE — Psych (Unsigned)
 Cln spoke with [name], pt's legal guardian, via phone on 07/10/24 at 11:34. Cln reviewed treatment plan with legal guardian who agreed with and approved plan for pt to begin PHP on 07/17/24. Legal guardian reports no questions or concerns. Cln reviewed the need for Rock to complete pt's intake paperwork and discussed options of receiving paperwork via email or coming into the office. Guardian requests paperwork to be emailed to [email or web address] and states if there is an issue she will come into the office. Guardian reports understanding paperwork must be done before or on the morning of pt's first appointment.   Randall Bastos, LCSW

## 2024-07-17 ENCOUNTER — Ambulatory Visit (HOSPITAL_COMMUNITY)

## 2024-07-17 ENCOUNTER — Ambulatory Visit (INDEPENDENT_AMBULATORY_CARE_PROVIDER_SITE_OTHER): Admitting: Licensed Clinical Social Worker

## 2024-07-17 DIAGNOSIS — F259 Schizoaffective disorder, unspecified: Secondary | ICD-10-CM

## 2024-07-17 DIAGNOSIS — F209 Schizophrenia, unspecified: Secondary | ICD-10-CM | POA: Diagnosis not present

## 2024-07-17 DIAGNOSIS — F4310 Post-traumatic stress disorder, unspecified: Secondary | ICD-10-CM

## 2024-07-17 DIAGNOSIS — F331 Major depressive disorder, recurrent, moderate: Secondary | ICD-10-CM

## 2024-07-17 DIAGNOSIS — F411 Generalized anxiety disorder: Secondary | ICD-10-CM

## 2024-07-17 DIAGNOSIS — R4589 Other symptoms and signs involving emotional state: Secondary | ICD-10-CM
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
## 2024-07-17 NOTE — Progress Notes (Signed)
" °   07/17/24 1256  Patient-Centered Profile  PCP Completed On 07/17/24  Plan Meeting Date 07/17/24  Effective Date 07/17/24  What People Like and Swan About? Pt states: I've been told I care a lot.  What's Important to? Pt states: getting a job. Living on my own. Being independent. Having someone.  How Best to Support? Pt states: just try to sit with me and tell me it's going to be okay.  Add What's Working / One[place] Not Working? Working:  I'm grateful I have a place to live and people who care about me.   Not Working: being 49 and living with my mom, car got repossessed, not with anyone, had to give up my dog, not working.  PCP Action Plan  Long Range Outcome Patient will report increased ability to manage depression and anxiety symptoms using coping skills at least 3x a day. Pt will report decrease in passive SI to 0 a week. Pt will report increased ability to catch, challenge, change cognitive distortions at least 3x a day.    PCP created with pt and pt's legal guardian aligns.   Randall Bastos, LCSW "
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
## 2024-07-18 ENCOUNTER — Ambulatory Visit (INDEPENDENT_AMBULATORY_CARE_PROVIDER_SITE_OTHER)

## 2024-07-18 ENCOUNTER — Ambulatory Visit (HOSPITAL_COMMUNITY)

## 2024-07-18 DIAGNOSIS — F209 Schizophrenia, unspecified: Secondary | ICD-10-CM | POA: Diagnosis not present

## 2024-07-18 DIAGNOSIS — F331 Major depressive disorder, recurrent, moderate: Secondary | ICD-10-CM | POA: Insufficient documentation

## 2024-07-18 DIAGNOSIS — R4589 Other symptoms and signs involving emotional state: Secondary | ICD-10-CM

## 2024-07-18 NOTE — Psych (Signed)
 " [facility] PHP THERAPIST PROGRESS NOTE  Colleen Peck [id number]   Session Time: 9:00 am - 10:00 am  Participation Level: Active  Behavioral Response: CasualAlertAnxious and Depressed  Type of Therapy: Group Therapy  Treatment Goals addressed: Coping  Progress Towards Goals: Initial  Interventions: CBT, DBT, Solution Focused, Strength-based, Supportive, and Reframing  Therapist Response: Clinician led check-in regarding current stressors and situation, and review of patient completed daily inventory. Clinician utilized active listening and empathetic response and validated patient emotions. Clinician facilitated processing group on pertinent issues.?   Summary: Patient arrived within time allowed. Patient rates their depression at a 8 and anxiety at a 10 on a scale of 1-10 with 10 being best. Pt reports, I just want help and I just want to be happy again. Pt provides some pertinent history and describes how she envisioned her life at this stage. She shares that not working has impacted her significantly and describes how she feels like another statistic regarding being an unemployed black woman. When asked about sleep and appetite, pt reports they slept 8 hours last night and ate 2 meals yesterday. Pt denied experiencing SI/SH thoughts and feelings of hopelessness since last session. Pt able to process.?Pt engaged in discussion.?      Session Time: 10:00 am - 11:00 am  Participation Level: Active  Behavioral Response: CasualAlertAnxious and Depressed  Type of Therapy: Group Therapy  Treatment Goals addressed: Coping  Progress Towards Goals: Initial  Interventions: CBT, DBT, Solution Focused, Strength-based, Supportive, and Reframing  Therapist Response: Clinician led processing group for pt's current struggles. Group members shared stressors and provided support and feedback. Clinician brought in topics of intrusive thoughts, thought defusion, and systemic oppression to  inform discussion.  Summary: Pt able to process and provide support to group.     Session Time: 11:00 am - 12:00 pm  Participation Level: Active  Behavioral Response: CasualAlertAnxious and Depressed  Type of Therapy: Group Therapy  Treatment Goals addressed: Coping  Progress Towards Goals: Initial  Interventions: CBT, DBT, Solution Focused, Strength-based, Supportive, and Reframing  Therapist Response: Clinican led group on radical acceptance. Clinician defined radical acceptance and invited patients to provide examples of things they have trouble accepting. Clinician asked patients to identify barriers to acceptance and pointed out when self-judgement was present. Clinician utilized DBT and ACT principles to inform discussion.   Summary: Pt engaged in discussion. Pt provides details of a situation which has been difficult to accept, as well as thoughts and feelings associated with it. She is receptive to feedback.   Session Time: 12:00 pm - 12:30 pm  Participation Level: Active  Behavioral Response: CasualAlertAnxious and Depressed  Type of Therapy: Group Therapy  Treatment Goals addressed: Coping  Progress Towards Goals: Initial  Interventions: Psychologist, Occupational, Supportive  Therapist Response: Reflection Group: Patients encouraged to practice skills and interpersonal techniques or work on mindfulness and relaxation techniques. The importance of self-care and making skills part of a routine to increase usage were stressed.  Summary: Patient engaged and participated appropriately.   Session Time: 12:30 pm - 1:30 pm  Participation Level: Active  Behavioral Response: CasualAlertAnxious and Depressed  Type of Therapy: Group Therapy  Treatment Goals addressed: Coping  Progress Towards Goals: Initial  Interventions: CBT, DBT, Solution Focused, Strength-based, Supportive, and Reframing  Therapist Response: Group was led by occupational therapist, Edward  Hollan.   Summary: Pt engaged and participated in discussion.   Session Time: 1:30 pm - 2:00 pm  Participation Level: Active  Behavioral  Response: CasualAlertAnxious and Depressed  Type of Therapy: Group Therapy  Treatment Goals addressed: Coping  Progress Towards Goals: Initial  Interventions: CBT, DBT, Solution Focused, Strength-based, Supportive, and Reframing  Therapist Response: 1:30 pm - 1:50 pm: Clinician continued discussion of radical acceptance and provided a handout entitled, 10 Steps to Practicing Radical Acceptance According to DBT's Katherleen Aldona Furlough. Clinician identified steps that may be more difficult to understand, such as practicing accepting with your whole self and provided examples of how one may utilize imagery. Clinician stressed that all steps are not going to be effective for every person and encouraged pts to practice and readjust as needed.  1:50 - 2:00 pm: Clinician led check-out. Clinician assessed for immediate needs, medication compliance and efficacy, and safety concerns?  Summary: 1:30 pm - 1:50 pm: Pt participated in discussion and asked thoughtful questions to ensure her understanding of the material. 1:50 - 2:00 pm: At check-out, patient contracts for safety.?Patient demonstrates progress as evidenced by her engagement and by being receptive to treatment. Patient denies SI/HI/self-harm thoughts at the end of group and agrees to seek help should those thoughts/feelings occur.?    Suicidal/Homicidal: Nowithout intent/plan  Plan: ?Pt will continue in PHP and medication management while continuing to work on decreasing depression symptoms,?SI, and anxiety symptoms,?and increasing the ability to self manage symptoms.     Collaboration of Care: Medication Management AEB Staci Kerns, NP  Patient/Guardian was advised Release of Information must be obtained prior to any record release in order to collaborate their care with an outside provider.  Patient/Guardian was advised if they have not already done so to contact the registration department to sign all necessary forms in order for us  to release information regarding their care.   Consent: Patient/Guardian gives verbal consent for treatment and assignment of benefits for services provided during this visit. Patient/Guardian expressed understanding and agreed to proceed.   Diagnosis: Schizophrenia, unspecified type (HCC) [F20.9]    1. Schizophrenia, unspecified type (HCC)   2. MDD (major depressive disorder), recurrent episode, moderate (HCC)       Will LILLETTE Pollack, LCSW 07/18/2024  "

## 2024-07-19 ENCOUNTER — Ambulatory Visit (INDEPENDENT_AMBULATORY_CARE_PROVIDER_SITE_OTHER): Admitting: Licensed Clinical Social Worker

## 2024-07-19 ENCOUNTER — Encounter (HOSPITAL_COMMUNITY): Payer: Self-pay

## 2024-07-19 ENCOUNTER — Ambulatory Visit (HOSPITAL_COMMUNITY)

## 2024-07-19 DIAGNOSIS — R4589 Other symptoms and signs involving emotional state: Secondary | ICD-10-CM

## 2024-07-19 DIAGNOSIS — F331 Major depressive disorder, recurrent, moderate: Secondary | ICD-10-CM

## 2024-07-19 DIAGNOSIS — F209 Schizophrenia, unspecified: Secondary | ICD-10-CM | POA: Diagnosis not present
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
## 2024-07-20 ENCOUNTER — Ambulatory Visit (INDEPENDENT_AMBULATORY_CARE_PROVIDER_SITE_OTHER): Admitting: Licensed Clinical Social Worker

## 2024-07-20 ENCOUNTER — Ambulatory Visit (HOSPITAL_COMMUNITY)

## 2024-07-20 DIAGNOSIS — F209 Schizophrenia, unspecified: Secondary | ICD-10-CM

## 2024-07-20 DIAGNOSIS — F331 Major depressive disorder, recurrent, moderate: Secondary | ICD-10-CM

## 2024-07-20 DIAGNOSIS — R4589 Other symptoms and signs involving emotional state: Secondary | ICD-10-CM

## 2024-07-23 ENCOUNTER — Ambulatory Visit (INDEPENDENT_AMBULATORY_CARE_PROVIDER_SITE_OTHER): Admitting: Licensed Clinical Social Worker

## 2024-07-23 ENCOUNTER — Ambulatory Visit (HOSPITAL_COMMUNITY)

## 2024-07-23 DIAGNOSIS — F331 Major depressive disorder, recurrent, moderate: Secondary | ICD-10-CM

## 2024-07-23 DIAGNOSIS — F209 Schizophrenia, unspecified: Secondary | ICD-10-CM

## 2024-07-23 DIAGNOSIS — R4589 Other symptoms and signs involving emotional state: Secondary | ICD-10-CM

## 2024-07-23 NOTE — Psych (Signed)
 " [facility] PHP THERAPIST PROGRESS NOTE  Colleen Peck [id number]   Session Time: 9:00 am - 10:00 am  Participation Level: Active  Behavioral Response: CasualAlertAnxious and Depressed  Type of Therapy: Group Therapy  Treatment Goals addressed: Coping  Progress Towards Goals: Progressing  Interventions: CBT, DBT, Solution Focused, Strength-based, Supportive, and Reframing  Therapist Response: Clinician led check-in regarding current stressors and situation, and review of patient completed daily inventory. Clinician utilized active listening and empathetic response and validated patient emotions. Clinician facilitated processing group on pertinent issues.?   Summary: Patient arrived within time allowed. Patient rates their depression at a 5 and anxiety at a 5 on a scale of 1-10 with 10 being best. Pt reports she is meeting with vocational rehab on Wednesday and feels continued embarrassment regarding being unemployed. She also shares that she is having vivid dreams that are disturbing to her due to pt feeling angry in them, such as a dream she had wherein she lost her phone. Pt denies having dreams about HI or aggression towards others, but still states, That's not the person I want to be. She expresses difficulty living in a religious household and feeling like she is not contributing, and pt verbalizes working on increasing self-compassion. When asked about sleep and appetite, pt reports they slept 10 hours last night and ate 4 meals yesterday. Pt denied experiencing SI/SH thoughts and feelings of hopelessness since last session. Pt able to process.?Pt engaged in discussion.?      Session Time: 10:00 am - 11:00 am  Participation Level: Active  Behavioral Response: CasualAlertAnxious and Depressed  Type of Therapy: Group Therapy  Treatment Goals addressed: Coping  Progress Towards Goals: Progressing  Interventions: CBT, DBT, Solution Focused, Strength-based, Supportive, and  Reframing  Therapist Response: Clinician led processing group for pt's current struggles. Group members shared stressors and provided support and feedback. Clinician brought in topics of self-care to inform discussion.  Summary: Pt able to process and provide support to group.     Session Time: 11:00 am - 12:00 pm  Participation Level: Active  Behavioral Response: CasualAlertAnxious and Depressed  Type of Therapy: Group Therapy  Treatment Goals addressed: Coping  Progress Towards Goals: Progressing  Interventions: CBT, DBT, Solution Focused, Strength-based, Supportive, and Reframing  Therapist Response: Group viewed Ted Talk entitled How To Not Take Things Personally presented by Wendelin Bolt. Cln utilized CBT principles to inform discussion while pts were encouraged to share their response to the video. Cln asked each patient to share a time in which they took things personally and reframed the situation based on what was shared in the [place] Talk.  Summary: Pt engaged in discussion. Pt expresses appreciation for the statement, People aren't against you. They are for themselves.   Session Time: 12:00 pm - 12:30 pm  Participation Level: Active  Behavioral Response: CasualAlertAnxious and Depressed  Type of Therapy: Group Therapy  Treatment Goals addressed: Coping  Progress Towards Goals: Progressing  Interventions: Psychologist, Occupational, Supportive  Therapist Response: Reflection Group: Patients encouraged to practice skills and interpersonal techniques or work on mindfulness and relaxation techniques. The importance of self-care and making skills part of a routine to increase usage were stressed.  Summary: Patient engaged and participated appropriately.   Session Time: 12:30 pm - 1:30 pm  Participation Level: Active  Behavioral Response: CasualAlertAnxious and Depressed  Type of Therapy: Group Therapy  Treatment Goals addressed: Coping  Progress Towards  Goals: Progressing  Interventions: CBT, DBT, Solution Focused, Strength-based, Supportive, and Reframing  Therapist Response: Group was led by occupational therapist, Edward Hollan.   Summary: Pt engaged and participated in discussion.   Session Time: 1:30 pm - 2:00 pm  Participation Level: Active  Behavioral Response: CasualAlertAnxious and Depressed  Type of Therapy: Group Therapy  Treatment Goals addressed: Coping  Progress Towards Goals: Progressing  Interventions: CBT, DBT, Solution Focused, Strength-based, Supportive, and Reframing  Therapist Response: 1:30 pm - 1:50 pm: Clinician led group on finding joy and asked each pt to identify three things they enjoy. Clinician also requested pts schedule a time this week to engage in something which brings them joy. 1:50 - 2:00 pm: Clinician led check-out. Clinician assessed for immediate needs, medication compliance and efficacy, and safety concerns?  Summary: 1:30 pm - 1:50 pm: Pt participated in discussion. Pt had some difficulty identifying an activity to do this week but ultimately decided to take a bath with essential oils, as suggested by cln. 1:50 - 2:00 pm: At check-out, patient contracts for safety.?Patient demonstrates progress as evidenced by her continued engagement and by being receptive to treatment. Patient denies SI/HI/self-harm thoughts at the end of group and agrees to seek help should those thoughts/feelings occur.?    Suicidal/Homicidal: Nowithout intent/plan  Plan: ?Pt Colleen continue in PHP and medication management while continuing to work on decreasing depression symptoms,?SI, and anxiety symptoms,?and increasing the ability to self manage symptoms.     Collaboration of Care: Medication Management AEB Staci Kerns, NP  Patient/Guardian was advised Release of Information must be obtained prior to any record release in order to collaborate their care with an outside provider. Patient/Guardian was advised if they  have not already done so to contact the registration department to sign all necessary forms in order for us  to release information regarding their care.   Consent: Patient/Guardian gives verbal consent for treatment and assignment of benefits for services provided during this visit. Patient/Guardian expressed understanding and agreed to proceed.   Diagnosis: Schizophrenia, unspecified type (HCC) [F20.9]    1. Schizophrenia, unspecified type (HCC)   2. MDD (major depressive disorder), recurrent episode, moderate (HCC)       Colleen LILLETTE Pollack, LCSW 07/23/2024  "

## 2024-07-24 ENCOUNTER — Ambulatory Visit (INDEPENDENT_AMBULATORY_CARE_PROVIDER_SITE_OTHER): Admitting: Professional

## 2024-07-24 ENCOUNTER — Ambulatory Visit (HOSPITAL_COMMUNITY)

## 2024-07-24 DIAGNOSIS — R4589 Other symptoms and signs involving emotional state: Secondary | ICD-10-CM

## 2024-07-24 DIAGNOSIS — F209 Schizophrenia, unspecified: Secondary | ICD-10-CM | POA: Diagnosis not present

## 2024-07-24 NOTE — Psych (Signed)
 " [facility] PHP THERAPIST PROGRESS NOTE  Colleen Peck [id number]   Session Time: 9:00 am - 10:00 am  Participation Level: Active  Behavioral Response: CasualAlertAnxious and Depressed  Type of Therapy: Group Therapy  Treatment Goals addressed: Coping  Progress Towards Goals: Progressing  Interventions: CBT, DBT, Solution Focused, Strength-based, Supportive, and Reframing  Therapist Response: Clinician led check-in regarding current stressors and situation, and review of patient completed daily inventory. Clinician utilized active listening and empathetic response and validated patient emotions. Clinician facilitated processing group on pertinent issues.?   Summary: Patient arrived within time allowed. Patient rates their depression at a 1 and their anxiety at a 1 on a scale of 1-10 with 10 being best. When asked about sleep and appetite, pt reports they slept 0 hours and ate 3x yesterday. Pt states, I feel great but maybe too great. She worries about a crash to come. She reports she did not sleep at all last night which is not normal, though she does not feel tired. She states she did try to lay down and go to sleep but couldn't, so she got on her phone and scrolled. She reports she feels productive. Pt reports she went out with her Mom for dinner and met some cool people last night. She shares she feels getting out of the house on a regular basis is probably helping her and contributing to her better mood. Pt states they feel they are making some progress in group and wants to continue to work on radical acceptance. 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 Patient able to process. Patient engaged in discussion.          Session Time: 10:00 am - 11:00 am   Participation Level: Active   Behavioral Response: Casual Alert and Anxious/Depressed   Type of Therapy: Group Therapy    Treatment Goals addressed: Coping   Progress Towards Goals: Progressing   Interventions: CBT, DBT, Solution Focused, Strength-based, Supportive, and Reframing   Therapist Response: Clinician led processing group for pt's current struggles. Group members shared stressors and provided support and feedback. Clinician brought in topics of being assertive, radical acceptance, SMART goals, and A-B-C productivity lists.   Summary: Pt able to process and provide support to group. Pt reports breaking down goals can be difficult for her and she wants to work on celebrating the clear channel communications. She wants to continue to work on using radical acceptance, especially related to depending on someone for everything for 2 years and I want my independence back, or at least some.   Session Time: 11:00 am - 12:00 pm   Participation Level: Active   Behavioral Response: Casual Alert and Anxious/Depressed   Type of Therapy: Group Therapy   Treatment Goals addressed: Coping   Progress Towards Goals: Progressing   Interventions: CBT, DBT, Solution Focused, Strength-based, Supportive, and Reframing   Therapist Response: Group was led by [facility] chaplain, Colleen [facility]ck.   Summary: Pt engaged in discussion.      Session Time: 12:00 pm - 12:30 pm   Participation Level: Active   Behavioral Response: Casual Alert and Anxious/Depressed   Type of Therapy: Group Therapy   Treatment Goals addressed: Coping   Progress Towards Goals: Progressing   Interventions: Psychologist, Occupational, Supportive   Therapist Response: Reflection Group: Patients encouraged to practice skills and interpersonal techniques or work on mindfulness and relaxation  techniques. The importance of self-Pe[facility] and making skills part of a routine to increase usage were stressed.   Summary: Pt engaged in discussion.      Session Time: 12:30 pm - 1:30 pm   Participation Level: Active   Behavioral Response: Casual Alert and  Anxious/Depressed   Type of Therapy: Group Therapy   Treatment Goals addressed: Coping   Progress Towards Goals: Progressing   Interventions: OT group   Therapist Response: Group was led by occupational therapist, Colleen [facility].    Summary: Pt engaged in discussion.       Session Time: 1:30 pm - 1:45 pm   Participation Level: Active   Behavioral Response: Casual Alert and Anxious/Depressed   Type of Therapy: Group Therapy   Treatment Goals addressed: Coping   Progress Towards Goals: Progressing   Interventions: CBT, DBT, Solution Focused, Strength-based, Supportive, and Reframing   Therapist Response: Clinician led check-out. Clinician assessed for immediate needs, medication compliance and efficacy, and safety concerns?   Summary: At check-out, patient reports no immediate concerns. Patient demonstrates progress as evidenced by engagement and responsiveness to treatment. Patient denies SI/HI/self-harm thoughts at the end of group.   Suicidal/Homicidal: Nowithout intent/plan  Plan: ?Pt will continue in PHP and medication management while continuing to work on decreasing depression symptoms,?SI, and anxiety symptoms,?and increasing the ability to self manage symptoms.     Collaboration of Peck: Medication Management AEB Colleen Kerns, NP  Patient/Guardian was advised Release of Information must be obtained prior to any record release in order to collaborate their [facility] with an outside provider. Patient/Guardian was advised if they have not already done so to contact the registration department to sign all necessary forms in order for us  to release information regarding their Pe[facility].   Consent: Patient/Guardian gives verbal consent for treatment and assignment of benefits for services provided during this visit. Patient/Guardian expressed understanding and agreed to proceed.   Diagnosis: Schizophrenia, unspecified type (HCC) [F20.9]    1. Schizophrenia, unspecified type Colleen [facility])        Colleen Peck Colleen Peck, Colleen Peck 07/24/2024  "

## 2024-07-25 ENCOUNTER — Ambulatory Visit (HOSPITAL_COMMUNITY)

## 2024-07-25 NOTE — Progress Notes (Signed)
 BH MD/PA/NP OP Progress Note  07/25/2024 1:36 PM Kirsti Mcalpine  MRN:  [id number]  Chief Complaint:  Erminio  I don't want to take my medications anymore, I fell and I don't feel right.   HPI: Kialee Kham 44 year old African-American female presents for weekly progress assessment while attending partial outpatient hospitalization programming.  January continues to deny suicidal or homicidal ideations.  Denies auditory or visual hallucinations.  Patient remains focused on medications as she reports a recent fall and had been experiencing dizziness.  She attributes her symptoms due to long-acting injection medication.  Patient advised to follow-up with legal guardian as most medication decisions is made by legal guardian.  She appeared receptive to plan .   Jolin reports experiencing vivid dreams to which she is also attributing to the longer acting medication.  Chart review patient is diagnosed with schizophrenia, major depressive disorder, intermittent explosive disorder and generalized anxiety disorder.  Long-acting medic patient with Haldol  decanoate 100 mg q. every 28 days.  Patient to keep outpatient follow-up appointment with psychiatrist Chien (resident) on 07/30/2024.  Denied any additional concerns noted at this visit.  Support, encouragement and reassurance was provided.    Visit Diagnosis:    ICD-10-CM   1. Schizophrenia, unspecified type (HCC)  F20.9     2. MDD (major depressive disorder), recurrent episode, moderate (HCC)  F33.1       Past Psychiatric History:   Past Medical History:  Past Medical History:  Diagnosis Date   Abnormal pap    pt reports abnl pap many years ago.  Nl since then.   Allergy    seasonal   Anemia    Asthma    Bipolar disorder (HCC)    Depression    Hypothyroidism    Palpitations 03/12/2008   Poor compliance with medication 03/08/2024   PTSD (post-traumatic stress disorder)    Seasonal allergies    Thyroid  disease 2009   Graves disease (pt  reported resolved); hypothyriodism    Past Surgical History:  Procedure Laterality Date   DILATION AND CURETTAGE OF UTERUS  March 2006   LYMPH NODE BIOPSY Right 11/23/2019   Procedure: EXCISIONAL BIOPSY OF RIGHT NECK NODE;  Surgeon: Ethyl Lonni BRAVO, MD;  Location: [facility];  Service: ENT;  Laterality: Right;    Family Psychiatric History:   Family History:  Family History  Problem Relation Age of Onset   Drug abuse Father    Depression Maternal Grandmother    Anxiety disorder Maternal Grandmother    COPD Maternal Grandmother    Suicidality Cousin    Depression Cousin    Bipolar disorder Cousin    Hypertension Mother    Depression Mother    Diabetes Paternal Grandfather    COPD Paternal Grandmother    Depression Maternal Aunt    Breast cancer Maternal Aunt    Depression Maternal Aunt    Heart disease Neg Hx     Social History:  Social History   Socioeconomic History   Marital status: Single    Spouse name: Not on file   Number of children: 0   Years of education: Not on file   Highest education level: Bachelor's degree (e.g., BA, AB, BS)  Occupational History   Not on file  Tobacco Use   Smoking status: Never   Smokeless tobacco: Never  Vaping Use   Vaping status: Never Used  Substance and Sexual Activity   Alcohol use: Yes    Comment: occ   Drug use: Not Currently  Comment: past use of marijuana in '08-'09. 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   Social Drivers of Health   Tobacco Use: Low Risk (07/19/2024)   Patient History    Smoking Tobacco Use: Never    Smokeless Tobacco Use: Never    Passive Exposure: Not on file  Financial Resource Strain: Not on file  Food Insecurity: Patient Declined (03/05/2024)    Epic    Worried About Programme Researcher, Broadcasting/film/video in the Last Year: Patient declined    Barista in the Last Year: Patient declined  Transportation Needs: No Transportation Needs (03/05/2024)   Epic    Lack of Transportation (Medical): No    Lack of Transportation (Non-Medical): No  Physical Activity: Not on file  Stress: Not on file  Social Connections: Not on file  Depression (PHQ2-9): High Risk (07/23/2024)   Depression (PHQ2-9)    PHQ-2 Score: 21  Alcohol Screen: Low Risk (03/05/2024)   Alcohol Screen    Last Alcohol Screening Score (AUDIT): 0  Housing: Unknown (03/05/2024)   Epic    Unable to Pay for Housing in the Last Year: Patient declined    Number of Times Moved in the Last Year: 0    Homeless in the Last Year: No  Utilities: Not At Risk (03/05/2024)   Epic    Threatened with loss of utilities: No  Health Literacy: Not on file    Allergies: Allergies[1]  Metabolic Disorder Labs: Lab Results  Component Value Date   HGBA1C 5.5 03/07/2024   MPG 111 03/07/2024   MPG 111.15 05/10/2021   Lab Results  Component Value Date   PROLACTIN 8.8 10/05/2015   PROLACTIN 27.1 (H) 08/21/2015   Lab Results  Component Value Date   CHOL 324 (H) 03/07/2024   TRIG 212 (H) 03/07/2024   HDL 47 03/07/2024   CHOLHDL 6.9 03/07/2024   VLDL 42 (H) 03/07/2024   LDLCALC 235 (H) 03/07/2024   LDLCALC 80 10/05/2015   Lab Results  Component Value Date   TSH 63.000 (H) 03/07/2024   TSH 28.253 (H) 05/17/2022    Therapeutic Level Labs: No results found for: LITHIUM  No results found for: VALPROATE No results found for: CBMZ  Current Medications: Current Outpatient Medications  Medication Sig Dispense Refill   albuterol  (VENTOLIN  HFA) 108 (90 Base) MCG/ACT inhaler INHALE 2 PUFFS INTO THE LUNGS EVERY 4 HOURS AS NEEDED FOR WHEEZING OR SHORTNESS OF BREATH 18 g 1   atorvastatin  (LIPITOR) 10 MG tablet Take 1 tablet (10 mg total) by mouth at bedtime. For high cholesterol (Patient not  taking: Reported on 07/19/2024) 30 tablet 0   escitalopram  (LEXAPRO ) 20 MG tablet Take 1 tablet (20 mg total) by mouth daily. 60 tablet 0   haloperidol  decanoate (HALDOL  DECANOATE) 100 MG/ML injection Inject 1 mL (100 mg total) into the muscle every 28 (twenty-eight) days. Patient's first oupatient dose on 04/10/24. Please deliver to [address] [place], [address] [place] [address]. 1 mL 12   hydrOXYzine  (ATARAX ) 25 MG tablet Take 1 tablet (25 mg total) by mouth 3 (three) times daily as needed for anxiety. 60 tablet 2   levothyroxine  (SYNTHROID ) 200 MCG tablet Take 1 tablet (200 mcg total) by mouth daily at 6 (six) AM.  For low functioning thyroid  gland. 30 tablet 0   naproxen  (NAPROSYN ) 500 MG tablet Take 1 tablet (500 mg total) by mouth 2 (two) times daily as needed. 30 tablet 0   traZODone  (DESYREL ) 50 MG tablet Take 1 tablet (50 mg total) by mouth at bedtime as needed for sleep. 90 tablet 0   Current Facility-Administered Medications  Medication Dose Route Frequency Provider Last Rate Last Admin   haloperidol  decanoate (HALDOL  DECANOATE) 100 MG/ML injection 100 mg  100 mg Intramuscular Q30 days    100 mg at 07/03/24 1016     Musculoskeletal: Strength & Muscle Tone: within normal limits Gait & Station: normal Patient leans: N/A  Psychiatric Specialty Exam: Review of Systems  There were no vitals taken for this visit.There is no height or weight on file to calculate BMI.  General Appearance: Casual  Eye Contact:  Good  Speech:  Clear and Coherent  Volume:  Normal  Mood:  Anxious and Depressed  Affect:  Congruent  Thought Process:  Coherent  Orientation:  Full (Time, Place, and Person)  Thought Content: Logical   Suicidal Thoughts:  No  Homicidal Thoughts:  No  Memory:  Immediate;   Good Recent;   Good  Judgement:  Good  Insight:  Good  Psychomotor Activity:  Normal  Concentration:  Concentration: Good  Recall:  Good  Fund of Knowledge: Good  Language: Good  Akathisia:  No   Handed:  Right  AIMS (if indicated): not done  Assets:  Communication Skills Desire for Improvement  ADL's:  Intact  Cognition: WNL  Sleep:  Good   Screenings: AIMS    Flowsheet Row [facility]  AIMS Total Score 0 0 0 0   AUDIT    Flowsheet Row [facility] ([facility] ([facility] ([facility]) [facility] (Discharged) [facility] 09[facility]01/2015 in [facility]  Alcohol Use Disorder Identification Test Final Score (AUDIT) 0 2 0 0 1   GAD-7    Flowsheet Row Counselor from 07/23/2024 in [facility]  Total GAD-7 Score 17   MDI    Flowsheet Row Counselor [facility]/14/2011 [facility] Office Visit from 06[facility]17/2011 [facility]  Total Score (max 50) 40 (P)  34   PHQ2-9    Flowsheet Row Counselor from 07/23/2024 in [facility] Counselor from 07/19/2024 in [facility] Counselor from 07/09/2024 in [facility] ED from 05/10/2021 in [facility] Office Visit from 03/27/2020 in [facility] [place]  PHQ-2 Total Score 5 6 6 3  0  PHQ-9 Total Score 21 23 24 14 6    Flowsheet Row Counselor from 07/19/2024 in  [facility] Counselor from 07/09/2024 in [facility] ED from 05/15/2024 in [facility]  C-SSRS RISK CATEGORY Error: Q3, 4, or 5 should not be populated when Q2 is No Error: Q3, 4,  or 5 should not be populated when Q2 is No No Risk     Assessment and Plan:  Continue partial outpatient hospitalization programming (PHP) Continue medications as directed Patient excused from group setting on 07/30/2024 - Keep outpatient appointment with psychiatrist and medication management.  Collaboration of Care: Collaboration of Care: Medication Management AEB    Patient/Guardian was advised Release of Information must be obtained prior to any record release in order to collaborate their care with an outside provider. Patient/Guardian was advised if they have not already done so to contact the registration department to sign all necessary forms in order for us  to release information regarding their care.   Consent: Patient/Guardian gives verbal consent for treatment and assignment of benefits for services provided during this visit. Patient/Guardian expressed understanding and agreed to proceed.    Staci LOISE Kerns, NP 07/25/2024, 1:36 PM     [1]  Allergies Allergen Reactions   Abilify  [Aripiprazole ] Other (See Comments)    AKATHISIA   Kiwi Extract Swelling   Latex Itching   Porcine (Pork) Protein-Containing Drug Products Diarrhea   Meloxicam  Rash

## 2024-07-26 ENCOUNTER — Ambulatory Visit (INDEPENDENT_AMBULATORY_CARE_PROVIDER_SITE_OTHER): Admitting: Licensed Clinical Social Worker

## 2024-07-26 ENCOUNTER — Ambulatory Visit (HOSPITAL_COMMUNITY)

## 2024-07-26 DIAGNOSIS — F331 Major depressive disorder, recurrent, moderate: Secondary | ICD-10-CM

## 2024-07-26 DIAGNOSIS — R4589 Other symptoms and signs involving emotional state: Secondary | ICD-10-CM

## 2024-07-26 DIAGNOSIS — F209 Schizophrenia, unspecified: Secondary | ICD-10-CM | POA: Diagnosis not present
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
## 2024-07-26 NOTE — Progress Notes (Signed)
 Spoke with patient in person for PHP. She feels depressed and overwhelmed with life. Has decided she wants to stop her Haldol  injection but will need her mother to approve since she is the guardian. Tearful at times. Asking if she will be allowed to attend nursing school with her diagnosis. She wants to attend school for nursing but feels it will be too much for her to do since she needs to work as well. 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 Has had poor sleep and generally feels bad. Has an appointment with her psychiatrist on 1/19 to discuss the Haldol . On scale 1-10 as 10 being worst she rates depression at 9 and anxiety at 8. Denies SI/HI or AVH. States she does not want to hurt herself but she just feels sad. No new side effects from medication. No issue or complaint.

## 2024-07-27 ENCOUNTER — Ambulatory Visit (INDEPENDENT_AMBULATORY_CARE_PROVIDER_SITE_OTHER): Admitting: Licensed Clinical Social Worker

## 2024-07-27 ENCOUNTER — Ambulatory Visit (HOSPITAL_COMMUNITY)

## 2024-07-27 DIAGNOSIS — F209 Schizophrenia, unspecified: Secondary | ICD-10-CM | POA: Diagnosis not present

## 2024-07-27 DIAGNOSIS — R4589 Other symptoms and signs involving emotional state: Secondary | ICD-10-CM

## 2024-07-27 DIAGNOSIS — F331 Major depressive disorder, recurrent, moderate: Secondary | ICD-10-CM

## 2024-07-29 ENCOUNTER — Encounter (HOSPITAL_COMMUNITY): Payer: Self-pay

## 2024-07-29 NOTE — Therapy (Signed)
 [facility] [address] [place], [place], [address] Phone: [phone]   Fax:  [phone]  Occupational Therapy Evaluation  Patient Details  Name: Colleen Peck MRN: [id number] Date of Birth: 11-25-1980 No data recorded  Encounter Date: 07/17/2024   OT End of Session - 07/29/24 1745     Visit Number 1    Number of Visits 15    Date for Recertification  08/04/24    OT Start Time 1230    OT Stop Time 1330    OT Time Calculation (min) 60 min    Activity Tolerance Patient tolerated treatment well    Behavior During Therapy [facility] for tasks assessed/performed          Past Medical History:  Diagnosis Date   Abnormal pap    pt reports abnl pap many years ago.  Nl since then.   Allergy    seasonal   Anemia    Asthma    Bipolar disorder (HCC)    Depression    Hypothyroidism    Palpitations 03/12/2008   Poor compliance with medication 03/08/2024   PTSD (post-traumatic stress disorder)    Seasonal allergies    Thyroid  disease 2009   Graves disease (pt reported resolved); hypothyriodism    Past Surgical History:  Procedure Laterality Date   DILATION AND CURETTAGE OF UTERUS  March 2006   LYMPH NODE BIOPSY Right 11/23/2019   Procedure: EXCISIONAL BIOPSY OF RIGHT NECK NODE;  Surgeon: Ethyl Lonni BRAVO, MD;  Location: [facility];  Service: ENT;  Laterality: Right;    There were no vitals filed for this visit.   Subjective Assessment - 07/29/24 1742     Subjective  I hope to learn new coping skills while I am here.    Pertinent History PTSD, GAD, Schizophrenia, MDD    Patient Stated Goals improve function in daily activities and Sx mgmt    Currently in Pain? No/denies    Pain Score 0-No pain    Multiple Pain Sites No              OT Assessment  OCAIRS Mental Health Interview Summary of Client Scores:  Facilitates participation in occupation Allows participation in occupation Inhibits participation in  occupation Restricts participation in occupation Comments:  Roles   X    Habits   X    Personal Causation   X    Values   X    Interests    X   Skills   X    Short-Term Goals    X   Long-term Goals    X   Interpretation of Past Experiences   X    Physical Environment  X     Social Environment    X   Readiness for Change X        Need for Occupational Therapy:  4 Shows positive occupational participation, no need for OT.   3 Need for minimal intervention/consultative participation   2 Need for OT intervention indicated to restore/improve participation   1 Need for extensive OT intervention indicated to improve participation.  Referral for follow up services also recommended.   Assessment:  Patient demonstrates behavior that INHIBITS participation in occupation.  Patient will benefit from occupational therapy intervention in order to improve time management, financial management, stress management, job readiness skills, social skills, and health management skills in preparation to return to full time community living and to be a productive community member.    Plan:  Patient will participate in skilled occupational therapy sessions individually or in a group setting to improve coping skills, psychosocial skills, and emotional skills required to return to prior level of function. Treatment will be 4-5 times per week for 3 weeks.     Group Session:  O: Patient participated in the Starting Without Forcing It group focused on task initiation using low-pressure, manageable entry points rather than reliance on motivation. Group emphasized reducing avoidance by identifying small, realistic starting actions and recognizing momentum created through initiation. Patients were encouraged to reflect on personal barriers to starting tasks and apply the concept to daily routines.    A: Patient demonstrated active engagement throughout the group, contributing verbally to discussion and reflecting on  personal patterns related to task initiation. Patient identified realistic, low-demand strategies for starting tasks and demonstrated emerging insight into reducing pressure-based avoidance. Presentation indicates ability to apply group concepts toward functional daily routines.                    OT Education - 07/29/24 1744     Education Details OCAIRS / Tx: Starting Without Forcing It    Person(s) Educated Patient    Methods Explanation;Handout    Comprehension Verbalized understanding           OT Short Term Goals - 07/29/24 1747       OT SHORT TERM GOAL #1   Title Client will identify and enlist the support of at least two accountability partners (family, friends, peers) to assist in the adherence to personal goals.    Time 3    Period Weeks    Status On-going    Target Date 08/04/24      OT SHORT TERM GOAL #2   Title By the time of discharge, client will independently set, track, and make progress towards a long-term goal, demonstrating resilience in overcoming obstacles and seeking support when needed.    Status On-going      OT SHORT TERM GOAL #3   Title Client will independently identify and modify three areas of the current routine that contribute to increased stress or dysfunction by the end of therapy.    Status On-going                   Plan - 07/29/24 1745     Clinical Impression Statement Pt presents w/ deficits across multiple psychosocial domains that inhibit participation in daily activities    OT Occupational Profile and History Problem Focused Assessment - Including review of records relating to presenting problem    Occupational performance deficits (Please refer to evaluation for details): IADL's;Rest and Sleep;Education;Work;Social Participation;Leisure    Psychosocial Skills Routines and Behaviors;Coping Strategies;Environmental  Adaptations;Habits;Interpersonal Interaction    Rehab Potential Good    Clinical Decision Making  Limited treatment options, no task modification necessary    Comorbidities Affecting Occupational Performance: None    Modification or Assistance to Complete Evaluation  No modification of tasks or assist necessary to complete eval    OT Frequency 5x / week    OT Duration Other (comment)   3 weeks   OT Treatment/Interventions Psychosocial skills training;Coping strategies training    Consulted and Agree with Plan of Care Patient          Patient will benefit from skilled therapeutic intervention in order to improve the following deficits and impairments:       Psychosocial Skills: Routines and Behaviors, Coping Strategies, Environmental  Adaptations, Habits, Interpersonal Interaction   Visit  Diagnosis: Difficulty coping  Schizoaffective disorder, unspecified type (HCC)  Major depressive disorder, recurrent episode, moderate (HCC)  Schizophrenia, unspecified type (HCC)  PTSD (post-traumatic stress disorder)  GAD (generalized anxiety disorder)    Problem List Patient Active Problem List   Diagnosis Date Noted   MDD (major depressive disorder), recurrent episode, moderate (HCC) 07/18/2024   Schizophrenia (HCC) 04/04/2024   Intermittent explosive disorder in adult 05/19/2022   GAD (generalized anxiety disorder) 05/19/2022   Aggressive behavior 05/19/2022   Aggression 05/17/2022   Altered mental status 05/17/2022   Bizarre behavior 05/17/2022   Sinus bradycardia 05/04/2022   Bradycardia 05/04/2022   Acute bilateral knee pain 02/04/2020   Chest pain 10/15/2019   Lump on neck 09/30/2019   Sciatic nerve pain, left 07/16/2019   Close exposure to COVID-19 virus 07/16/2019   Acute back pain with radiculopathy 01/22/2019   Onycholysis 11/14/2018   Palpable lymph node 10/05/2018   Cyclical mastalgia 10/05/2018   PMS (premenstrual syndrome) 08/20/2018   Vertigo 05/31/2018   Dermatofibroma of right lower leg 01/06/2017   History of borderline personality disorder 01/01/2016    Carotidynia 12/16/2015   Anxiety    PTSD (post-traumatic stress disorder) 09/30/2015   Routine screening for STI (sexually transmitted infection)    Paranoia (HCC)    Myxedema 02/26/2015   Anemia 01/30/2015   Vitamin D  deficiency 05/22/2013   Skin rash 03/05/2013   SOB (shortness of breath) on exertion 07/13/2012   Obesity 09/08/2006   Allergic rhinitis 09/08/2006   Asthma 09/08/2006   ECZEMA, ATOPIC DERMATITIS 09/08/2006    [name], OT 07/29/2024, 5:48 PM  [name], OT   [facility] [address] [place], [place], [address] Phone: [phone]   Fax:  [phone]  Name: Colleen Peck MRN: [id number] Date of Birth: 28-Jul-1980

## 2024-07-29 NOTE — Therapy (Signed)
 [facility] [address] [place], [place], [address] Phone: [phone]   Fax:  [phone]  Occupational Therapy Treatment  Patient Details  Name: Colleen Peck MRN: [id number] Date of Birth: June 01, 1981 No data recorded  Encounter Date: 07/19/2024   OT End of Session - 07/29/24 2050     Visit Number 3    Number of Visits 15    Date for Recertification  08/04/24    OT Start Time 1230    OT Stop Time 1330    OT Time Calculation (min) 60 min          Past Medical History:  Diagnosis Date   Abnormal pap    pt reports abnl pap many years ago.  Nl since then.   Allergy    seasonal   Anemia    Asthma    Bipolar disorder (HCC)    Depression    Hypothyroidism    Palpitations 03/12/2008   Poor compliance with medication 03/08/2024   PTSD (post-traumatic stress disorder)    Seasonal allergies    Thyroid  disease 2009   Graves disease (pt reported resolved); hypothyriodism    Past Surgical History:  Procedure Laterality Date   DILATION AND CURETTAGE OF UTERUS  March 2006   LYMPH NODE BIOPSY Right 11/23/2019   Procedure: EXCISIONAL BIOPSY OF RIGHT NECK NODE;  Surgeon: Ethyl Lonni BRAVO, MD;  Location: [facility];  Service: ENT;  Laterality: Right;    There were no vitals filed for this visit.   Subjective Assessment - 07/29/24 2050     Currently in Pain? No/denies    Pain Score 0-No pain           Group Session:  S: Doing okay today, still getting used to sharing in here. But okay overall.   O: Patient participated in the Starting Without Forcing It group focused on task initiation using low-pressure, manageable entry points rather than reliance on motivation. Group emphasized reducing avoidance by identifying small, realistic starting actions and recognizing momentum created through initiation. Patients were encouraged to reflect on personal barriers to starting tasks and apply the concept to daily  routines.    A: Patient demonstrated active engagement throughout the group, contributing verbally to discussion and reflecting on personal patterns related to task initiation. Patient identified realistic, low-demand strategies for starting tasks and demonstrated emerging insight into reducing pressure-based avoidance. Presentation indicates ability to apply group concepts toward functional daily routines.    P: Continue to attend PHP OT group sessions 5x week for 3 weeks to promote daily structure, social engagement, and opportunities to develop and utilize adaptive strategies to maximize functional performance in preparation for safe transition and integration back into school, work, and the community. Plan to address topic of tbd in next OT group session.                     OT Education - 07/29/24 2050     Education Details Starting Without Forcing It    Person(s) Educated Patient    Methods Explanation;Handout    Comprehension Verbalized understanding           OT Short Term Goals - 07/29/24 1747       OT SHORT TERM GOAL #1   Title Client will identify and enlist the support of at least two accountability partners (family, friends, peers) to assist in the adherence to personal goals.    Time 3    Period Weeks  Status On-going    Target Date 08/04/24      OT SHORT TERM GOAL #2   Title By the time of discharge, client will independently set, track, and make progress towards a long-term goal, demonstrating resilience in overcoming obstacles and seeking support when needed.    Status On-going      OT SHORT TERM GOAL #3   Title Client will independently identify and modify three areas of the current routine that contribute to increased stress or dysfunction by the end of therapy.    Status On-going                   Plan - 07/29/24 2050     Psychosocial Skills Routines and Behaviors;Coping Strategies;Environmental   Adaptations;Habits;Interpersonal Interaction          Patient will benefit from skilled therapeutic intervention in order to improve the following deficits and impairments:       Psychosocial Skills: Routines and Behaviors, Coping Strategies, Environmental  Adaptations, Habits, Interpersonal Interaction   Visit Diagnosis: Difficulty coping    Problem List Patient Active Problem List   Diagnosis Date Noted   MDD (major depressive disorder), recurrent episode, moderate (HCC) 07/18/2024   Schizophrenia (HCC) 04/04/2024   Intermittent explosive disorder in adult 05/19/2022   GAD (generalized anxiety disorder) 05/19/2022   Aggressive behavior 05/19/2022   Aggression 05/17/2022   Altered mental status 05/17/2022   Bizarre behavior 05/17/2022   Sinus bradycardia 05/04/2022   Bradycardia 05/04/2022   Acute bilateral knee pain 02/04/2020   Chest pain 10/15/2019   Lump on neck 09/30/2019   Sciatic nerve pain, left 07/16/2019   Close exposure to COVID-19 virus 07/16/2019   Acute back pain with radiculopathy 01/22/2019   Onycholysis 11/14/2018   Palpable lymph node 10/05/2018   Cyclical mastalgia 10/05/2018   PMS (premenstrual syndrome) 08/20/2018   Vertigo 05/31/2018   Dermatofibroma of right lower leg 01/06/2017   History of borderline personality disorder 01/01/2016   Carotidynia 12/16/2015   Anxiety    PTSD (post-traumatic stress disorder) 09/30/2015   Routine screening for STI (sexually transmitted infection)    Paranoia (HCC)    Myxedema 02/26/2015   Anemia 01/30/2015   Vitamin D  deficiency 05/22/2013   Skin rash 03/05/2013   SOB (shortness of breath) on exertion 07/13/2012   Obesity 09/08/2006   Allergic rhinitis 09/08/2006   Asthma 09/08/2006   ECZEMA, ATOPIC DERMATITIS 09/08/2006    [name], OT 07/29/2024, 8:51 PM  [name], OT   [facility] [address] [place], [place], [address] Phone: [phone]    Fax:  [phone]  Name: Colleen Peck MRN: [id number] Date of Birth: Feb 17, 1981

## 2024-07-29 NOTE — Therapy (Signed)
 [facility] [address] [place], [place], [address] Phone: [phone]   Fax:  [phone]  Occupational Therapy Treatment  Patient Details  Name: Colleen Peck MRN: [id number] Date of Birth: Jan 20, 1981 No data recorded  Encounter Date: 07/18/2024   OT End of Session - 07/29/24 2027     Visit Number 2    Number of Visits 15    Date for Recertification  08/04/24    OT Start Time 1230    OT Stop Time 1330    OT Time Calculation (min) 60 min    Activity Tolerance Patient tolerated treatment well    Behavior During Therapy [facility] for tasks assessed/performed          Past Medical History:  Diagnosis Date   Abnormal pap    pt reports abnl pap many years ago.  Nl since then.   Allergy    seasonal   Anemia    Asthma    Bipolar disorder (HCC)    Depression    Hypothyroidism    Palpitations 03/12/2008   Poor compliance with medication 03/08/2024   PTSD (post-traumatic stress disorder)    Seasonal allergies    Thyroid  disease 2009   Graves disease (pt reported resolved); hypothyriodism    Past Surgical History:  Procedure Laterality Date   DILATION AND CURETTAGE OF UTERUS  March 2006   LYMPH NODE BIOPSY Right 11/23/2019   Procedure: EXCISIONAL BIOPSY OF RIGHT NECK NODE;  Surgeon: Ethyl Lonni BRAVO, MD;  Location: [facility];  Service: ENT;  Laterality: Right;    There were no vitals filed for this visit.   Subjective Assessment - 07/29/24 2027     Currently in Pain? No/denies    Pain Score 0-No pain               Group Session:  S: Doing okay today.   O: Patient participated in the Starting Without Forcing It group focused on task initiation using low-pressure, manageable entry points rather than reliance on motivation. Group emphasized reducing avoidance by identifying small, realistic starting actions and recognizing momentum created through initiation. Patients were encouraged to reflect on personal  barriers to starting tasks and apply the concept to daily routines.    A: Patient demonstrated active engagement throughout the group, contributing verbally to discussion and reflecting on personal patterns related to task initiation. Patient identified realistic, low-demand strategies for starting tasks and demonstrated emerging insight into reducing pressure-based avoidance. Presentation indicates ability to apply group concepts toward functional daily routines.    P: Continue to attend PHP OT group sessions 5x week for 3 weeks to promote daily structure, social engagement, and opportunities to develop and utilize adaptive strategies to maximize functional performance in preparation for safe transition and integration back into school, work, and the community. Plan to address topic of tbd in next OT group session.                 OT Education - 07/29/24 2027     Education Details Starting Without Forcing It    Person(s) Educated Patient    Methods Explanation;Handout    Comprehension Verbalized understanding           OT Short Term Goals - 07/29/24 1747       OT SHORT TERM GOAL #1   Title Client will identify and enlist the support of at least two accountability partners (family, friends, peers) to assist in the adherence to personal goals.  Time 3    Period Weeks    Status On-going    Target Date 08/04/24      OT SHORT TERM GOAL #2   Title By the time of discharge, client will independently set, track, and make progress towards a long-term goal, demonstrating resilience in overcoming obstacles and seeking support when needed.    Status On-going      OT SHORT TERM GOAL #3   Title Client will independently identify and modify three areas of the current routine that contribute to increased stress or dysfunction by the end of therapy.    Status On-going                   Plan - 07/29/24 2028     Psychosocial Skills Routines and Behaviors;Coping  Strategies;Environmental  Adaptations;Habits;Interpersonal Interaction          Patient will benefit from skilled therapeutic intervention in order to improve the following deficits and impairments:       Psychosocial Skills: Routines and Behaviors, Coping Strategies, Environmental  Adaptations, Habits, Interpersonal Interaction   Visit Diagnosis: Difficulty coping    Problem List Patient Active Problem List   Diagnosis Date Noted   MDD (major depressive disorder), recurrent episode, moderate (HCC) 07/18/2024   Schizophrenia (HCC) 04/04/2024   Intermittent explosive disorder in adult 05/19/2022   GAD (generalized anxiety disorder) 05/19/2022   Aggressive behavior 05/19/2022   Aggression 05/17/2022   Altered mental status 05/17/2022   Bizarre behavior 05/17/2022   Sinus bradycardia 05/04/2022   Bradycardia 05/04/2022   Acute bilateral knee pain 02/04/2020   Chest pain 10/15/2019   Lump on neck 09/30/2019   Sciatic nerve pain, left 07/16/2019   Close exposure to COVID-19 virus 07/16/2019   Acute back pain with radiculopathy 01/22/2019   Onycholysis 11/14/2018   Palpable lymph node 10/05/2018   Cyclical mastalgia 10/05/2018   PMS (premenstrual syndrome) 08/20/2018   Vertigo 05/31/2018   Dermatofibroma of right lower leg 01/06/2017   History of borderline personality disorder 01/01/2016   Carotidynia 12/16/2015   Anxiety    PTSD (post-traumatic stress disorder) 09/30/2015   Routine screening for STI (sexually transmitted infection)    Paranoia (HCC)    Myxedema 02/26/2015   Anemia 01/30/2015   Vitamin D  deficiency 05/22/2013   Skin rash 03/05/2013   SOB (shortness of breath) on exertion 07/13/2012   Obesity 09/08/2006   Allergic rhinitis 09/08/2006   Asthma 09/08/2006   ECZEMA, ATOPIC DERMATITIS 09/08/2006    [name], OT 07/29/2024, 8:32 PM [name], OT   [place] [facility] [address] [place], [place],  [address] Phone: [phone]   Fax:  [phone]  Name: Colleen Peck MRN: [id number] Date of Birth: Mar 14, 1981

## 2024-07-30 ENCOUNTER — Ambulatory Visit (HOSPITAL_COMMUNITY)

## 2024-07-30 ENCOUNTER — Ambulatory Visit (HOSPITAL_COMMUNITY): Admitting: Psychiatry

## 2024-07-30 VITALS — Ht 66.5 in | Wt 251.0 lb

## 2024-07-30 DIAGNOSIS — F209 Schizophrenia, unspecified: Secondary | ICD-10-CM

## 2024-07-30 DIAGNOSIS — F4310 Post-traumatic stress disorder, unspecified: Secondary | ICD-10-CM | POA: Diagnosis not present

## 2024-07-30 DIAGNOSIS — F331 Major depressive disorder, recurrent, moderate: Secondary | ICD-10-CM | POA: Diagnosis not present

## 2024-07-30 DIAGNOSIS — F411 Generalized anxiety disorder: Secondary | ICD-10-CM

## 2024-07-30 MED ORDER — ESCITALOPRAM OXALATE 20 MG PO TABS: 20.0000 mg | ORAL_TABLET | Freq: Every day | ORAL | 0 refills | Status: AC

## 2024-07-30 MED ORDER — HYDROXYZINE HCL 25 MG PO TABS: 25.0000 mg | ORAL_TABLET | Freq: Three times a day (TID) | ORAL | 2 refills | Status: AC | PRN

## 2024-07-30 MED ORDER — TRAZODONE HCL 50 MG PO TABS: 50.0000 mg | ORAL_TABLET | Freq: Every evening | ORAL | 0 refills | Status: AC | PRN

## 2024-07-30 MED ORDER — HALOPERIDOL DECANOATE 50 MG/ML IM SOLN: 50.0000 mg | Freq: Once | INTRAMUSCULAR | Status: AC

## 2024-07-30 MED ORDER — PALIPERIDONE ER 3 MG PO TB24: 3.0000 mg | ORAL_TABLET | Freq: Every day | ORAL | 0 refills | Status: AC

## 2024-07-30 NOTE — Patient Instructions (Signed)
 Please take your invega  3mg  starting February 9th for 9 days until your appointment on February 18th

## 2024-07-30 NOTE — Progress Notes (Signed)
 Patient was here to see her provider today, she did not want her Haldol  shot and during her appointment it was agreed that she could reduce the dose to 50 mg/mL. We already had her Haldol  100 mg here, Provider instructed me to give 1/2 a dose - 1/2 mL instead of 1 ml to equal 50 mg. Patient presents well groomed with a flat affect. Patient is not happy about getting the shot, but understands that she needs it. Patient denies any SI/HI or AVH. Injection was prepared as ordered and administered in patients LD. Patient tolerated well and without complaint.      NDC: 72603-230-01 LOT: [id number] EXP: 2027/JUL

## 2024-07-31 ENCOUNTER — Ambulatory Visit (HOSPITAL_COMMUNITY)

## 2024-07-31 NOTE — Addendum Note (Signed)
 Addended by: CARVIN CROCK on: 07/31/2024 12:44 PM   Modules accepted: Level of Service

## 2024-08-01 ENCOUNTER — Ambulatory Visit (HOSPITAL_COMMUNITY): Admitting: Licensed Clinical Social Worker

## 2024-08-01 ENCOUNTER — Encounter (HOSPITAL_COMMUNITY): Payer: Self-pay

## 2024-08-01 ENCOUNTER — Ambulatory Visit (HOSPITAL_COMMUNITY)

## 2024-08-01 DIAGNOSIS — F331 Major depressive disorder, recurrent, moderate: Secondary | ICD-10-CM

## 2024-08-01 DIAGNOSIS — F209 Schizophrenia, unspecified: Secondary | ICD-10-CM | POA: Diagnosis not present

## 2024-08-01 NOTE — Progress Notes (Signed)
 [facility] MD/PA/NP OP Progress Note  08/01/2024 1:00 PM Colleen Peck  MRN:  [id number]  Chief Complaint: Weekly follow-up progress assessment  HPI: Colleen Peck seen and evaluated face-to-face by this provider.  Continues to endorse sadness depressed and feeling overwhelmed.  States she recently had a outpatient appointment by her primary care provider.  Stated ongoing frustration related to having to take long-acting injection medication due to reported side effects of falling.   I am in my right mind I do not need any medications.  Currently she is prescribed Haldol  100 mg decanoate q. every 28 days.  Stated she was decreased from Haldol  100 mg to Haldol  50 mg long-acting injectable.    Colleen Peck states 1 positive outcome is that family and healthcare provider heard her concern related to medication side effects and has plans to start  Invega .  At follow-up outpatient appointment.  Colleen Peck reports working on radical acceptance.  States she was well until to attend group setting however is learning multiple coping skills. Patient to continue with daily group sessions.  Colleen Peck  denied suicidal or homicidal ideations.  Denied auditory or  visual hallucinations.. Reported a good appetite states she is resting well throughout the night.  Will continue to monitor for safety.  Support,encouragement  and reassurance was provided.  Visit Diagnosis:    ICD-10-CM   1. Schizophrenia, unspecified type (HCC)  F20.9     2. Moderate episode of recurrent major depressive disorder (HCC)  F33.1       Past Psychiatric History: Schizophrenia, major depressive disorder, generalized anxiety disorder.  Past Medical History:  Past Medical History:  Diagnosis Date   Abnormal pap    pt reports abnl pap many years ago.  Nl since then.   Allergy    seasonal   Anemia    Asthma    Bipolar disorder (HCC)    Depression    Hypothyroidism    Palpitations 03/12/2008   Poor compliance with medication 03/08/2024    PTSD (post-traumatic stress disorder)    Seasonal allergies    Thyroid  disease 2009   Graves disease (pt reported resolved); hypothyriodism    Past Surgical History:  Procedure Laterality Date   DILATION AND CURETTAGE OF UTERUS  March 2006   LYMPH NODE BIOPSY Right 11/23/2019   Procedure: EXCISIONAL BIOPSY OF RIGHT NECK NODE;  Surgeon: Ethyl Lonni BRAVO, MD;  Location: [facility];  Service: ENT;  Laterality: Right;    Family Psychiatric History:   Family History:  Family History  Problem Relation Age of Onset   Drug abuse Father    Depression Maternal Grandmother    Anxiety disorder Maternal Grandmother    COPD Maternal Grandmother    Suicidality Cousin    Depression Cousin    Bipolar disorder Cousin    Hypertension Mother    Depression Mother    Diabetes Paternal Grandfather    COPD Paternal Grandmother    Depression Maternal Aunt    Breast cancer Maternal Aunt    Depression Maternal Aunt    Heart disease Neg Hx     Social History:  Social History   Socioeconomic History   Marital status: Single    Spouse name: Not on file   Number of children: 0   Years of education: Not on file   Highest education level: Bachelor's degree (e.g., BA, AB, BS)  Occupational History   Not on file  Tobacco Use   Smoking status: Never   Smokeless tobacco: Never  Vaping Use  Vaping status: Never Used  Substance and Sexual Activity   Alcohol use: Yes    Comment: occ   Drug use: Not Currently    Comment: past use of marijuana in '08-'09. 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   Social Drivers of Health   Tobacco Use: Low Risk (07/29/2024)   Patient History    Smoking Tobacco Use: Never    Smokeless Tobacco Use: Never    Passive  Exposure: Not on file  Financial Resource Strain: Not on file  Food Insecurity: Patient Declined (03/05/2024)   Epic    Worried About Programme Researcher, Broadcasting/film/video in the Last Year: Patient declined    Barista in the Last Year: Patient declined  Transportation Needs: No Transportation Needs (03/05/2024)   Epic    Lack of Transportation (Medical): No    Lack of Transportation (Non-Medical): No  Physical Activity: Not on file  Stress: Not on file  Social Connections: Not on file  Depression (PHQ2-9): High Risk (07/23/2024)   Depression (PHQ2-9)    PHQ-2 Score: 21  Alcohol Screen: Low Risk (03/05/2024)   Alcohol Screen    Last Alcohol Screening Score (AUDIT): 0  Housing: Unknown (03/05/2024)   Epic    Unable to Pay for Housing in the Last Year: Patient declined    Number of Times Moved in the Last Year: 0    Homeless in the Last Year: No  Utilities: Not At Risk (03/05/2024)   Epic    Threatened with loss of utilities: No  Health Literacy: Not on file    Allergies: Allergies[1]  Metabolic Disorder Labs: Lab Results  Component Value Date   HGBA1C 5.5 03/07/2024   MPG 111 03/07/2024   MPG 111.15 05/10/2021   Lab Results  Component Value Date   PROLACTIN 8.8 10/05/2015   PROLACTIN 27.1 (H) 08/21/2015   Lab Results  Component Value Date   CHOL 324 (H) 03/07/2024   TRIG 212 (H) 03/07/2024   HDL 47 03/07/2024   CHOLHDL 6.9 03/07/2024   VLDL 42 (H) 03/07/2024   LDLCALC 235 (H) 03/07/2024   LDLCALC 80 10/05/2015   Lab Results  Component Value Date   TSH 63.000 (H) 03/07/2024   TSH 28.253 (H) 05/17/2022    Therapeutic Level Labs: No results found for: LITHIUM  No results found for: VALPROATE No results found for: CBMZ  Current Medications: Current Outpatient Medications  Medication Sig Dispense Refill   albuterol  (VENTOLIN  HFA) 108 (90 Base) MCG/ACT inhaler INHALE 2 PUFFS INTO THE LUNGS EVERY 4 HOURS AS NEEDED FOR WHEEZING OR SHORTNESS OF BREATH 18 g 1    atorvastatin  (LIPITOR) 10 MG tablet Take 1 tablet (10 mg total) by mouth at bedtime. For high cholesterol (Patient not taking: Reported on 07/19/2024) 30 tablet 0   escitalopram  (LEXAPRO ) 20 MG tablet Take 1 tablet (20 mg total) by mouth daily. 90 tablet 0   hydrOXYzine  (ATARAX ) 25 MG tablet Take 1 tablet (25 mg total) by mouth 3 (three) times daily as needed for anxiety. 90 tablet 2   levothyroxine  (SYNTHROID ) 200 MCG tablet Take 1 tablet (200 mcg total) by mouth daily at 6 (six) AM. For low functioning thyroid  gland. 30 tablet 0   naproxen  (NAPROSYN ) 500 MG tablet  Take 1 tablet (500 mg total) by mouth 2 (two) times daily as needed. 30 tablet 0   [START ON 08/20/2024] paliperidone  (INVEGA ) 3 MG 24 hr tablet Take 1 tablet (3 mg total) by mouth daily. 9 tablet 0   traZODone  (DESYREL ) 50 MG tablet Take 1 tablet (50 mg total) by mouth at bedtime as needed for sleep. 90 tablet 0   Current Facility-Administered Medications  Medication Dose Route Frequency Provider Last Rate Last Admin   haloperidol  decanoate (HALDOL  DECANOATE) 50 MG/ML injection 50 mg  50 mg Intramuscular Once Graham Krabbe, MD         Musculoskeletal: Strength & Muscle Tone: within normal limits Gait & Station: normal Patient leans: N/A  Psychiatric Specialty Exam: Review of Systems  There were no vitals taken for this visit.There is no height or weight on file to calculate BMI.  General Appearance: Casual  Eye Contact:  Good  Speech:  Clear and Coherent  Volume:  Normal  Mood:  Depressed and Irritable  Affect:  Congruent  Thought Process:  Coherent  Orientation:  Full (Time, Place, and Person)  Thought Content: Logical   Suicidal Thoughts:  No  Homicidal Thoughts:  No  Memory:  Immediate;   Fair Recent;   Fair  Judgement:  Fair  Insight:  Good  Psychomotor Activity:  Normal  Concentration:  Concentration: Good  Recall:  Good  Fund of Knowledge: Good  Language: Good  Akathisia:  No  Handed:  Right  AIMS (if  indicated): not done  Assets:  Communication Skills Desire for Improvement  ADL's:  Intact  Cognition: WNL  Sleep:  Fair   Screenings: AIMS    Flowsheet Row [facility] Ad[facility] (Dis[facility]) [facility] (Dis[facility]  AIMS Total Score 0 0 0 0   AUDIT    Flowsheet Row [facility] (Dis[facility] Ad[facility] (Discharged[facility] Ad[facility] (Discharged) [facility] Admission (Discharged) from 09/01/2015 in [facility]  Alcohol Use Disorder Identification Test Final Score (AUDIT) 0 2 0 0 1   GAD-7    Flowsheet Row Counselor from 07/23/2024 in [facility]  Total GAD-7 Score 17   MDI    Flowsheet Row Counselor from [facility]/14/2011 [facility] Office Visit from 06[facility]17/2011 [facility]  Total Score (max 50) 40 (P)  34   PHQ2-9    Flowsheet Row Counselor from 07/23/2024 in [facility] Counselor from 07/19/2024 in [facility] Counselor from 07/09/2024 in [facility] ED from 05/10/2021 in [facility] Office Visit from 03/27/2020 in [facility] [place]  PHQ-2 Total Score 5 6 6 3  0  PHQ-9 Total Score 21 23 24 14 6    Flowsheet Row Counselor from 07/19/2024 in [facility] Counselor from 07/09/2024 in [facility] ED from 05/15/2024 in [facility]  C-SSRS RISK CATEGORY Error: Q3, 4, or 5 should not be populated  when Q2 is No Error: Q3, 4, or 5 should not be populated when Q2 is No No Risk     Assessment and Plan:  Continue partial outpatient hospitalization Continue medications as directed  Collaboration of Care: Collaboration of Care: Medication Management AEB currently prescribed Haldol  50 mg decanoate with plans to taper to Invega   Patient/Guardian was advised Release of Information must be obtained prior to any record release in order to collaborate their care with an outside provider. Patient/Guardian was advised if they have not already done so to contact the registration department to sign all necessary forms in order for us  to release information regarding their care.   Consent: Patient/Guardian gives verbal consent for treatment and assignment of benefits for services provided during this visit. Patient/Guardian expressed understanding and agreed to proceed.    Staci LOISE Kerns, NP 08/01/2024, 1:00 PM      [1]  Allergies Allergen Reactions   Abilify  [Aripiprazole ] Other (See Comments)    AKATHISIA   Kiwi Extract Swelling   Latex Itching   Porcine (Pork) Protein-Containing Drug Products Diarrhea   Meloxicam  Rash

## 2024-08-02 ENCOUNTER — Ambulatory Visit (HOSPITAL_COMMUNITY)

## 2024-08-03 ENCOUNTER — Ambulatory Visit (HOSPITAL_COMMUNITY)

## 2024-08-03 ENCOUNTER — Ambulatory Visit (INDEPENDENT_AMBULATORY_CARE_PROVIDER_SITE_OTHER): Admitting: Licensed Clinical Social Worker

## 2024-08-03 DIAGNOSIS — R4589 Other symptoms and signs involving emotional state: Secondary | ICD-10-CM

## 2024-08-03 DIAGNOSIS — F209 Schizophrenia, unspecified: Secondary | ICD-10-CM

## 2024-08-03 DIAGNOSIS — F331 Major depressive disorder, recurrent, moderate: Secondary | ICD-10-CM

## 2024-08-05 ENCOUNTER — Encounter (HOSPITAL_COMMUNITY): Payer: Self-pay

## 2024-08-05 NOTE — Therapy (Signed)
 [place] [facility] [address] [place], [place], [address] Phone: [phone]   Fax:  [phone]  Occupational Therapy Treatment  Patient Details  Name: Colleen Peck MRN: [id number] Date of Birth: Jun 19, 1981 No data recorded  Encounter Date: 07/26/2024   OT End of Session - 08/05/24 1622     Visit Number 7    Number of Visits 15    Date for Recertification  08/04/24    OT Start Time 1230    OT Stop Time 1330    OT Time Calculation (min) 60 min    Activity Tolerance Patient tolerated treatment well    Behavior During Therapy [facility] for tasks assessed/performed          Past Medical History:  Diagnosis Date   Abnormal pap    pt reports abnl pap many years ago.  Nl since then.   Allergy    seasonal   Anemia    Asthma    Bipolar disorder (HCC)    Depression    Hypothyroidism    Palpitations 03/12/2008   Poor compliance with medication 03/08/2024   PTSD (post-traumatic stress disorder)    Seasonal allergies    Thyroid  disease 2009   Graves disease (pt reported resolved); hypothyriodism    Past Surgical History:  Procedure Laterality Date   DILATION AND CURETTAGE OF UTERUS  March 2006   LYMPH NODE BIOPSY Right 11/23/2019   Procedure: EXCISIONAL BIOPSY OF RIGHT NECK NODE;  Surgeon: Ethyl Lonni BRAVO, MD;  Location: [facility];  Service: ENT;  Laterality: Right;    There were no vitals filed for this visit.   Subjective Assessment - 08/05/24 1621     Currently in Pain? No/denies    Pain Score 0-No pain            Group Session:  S: I'm feeling better today overall.   O: Group focused on the R (Relevant) component of SMART goals to improve motivation and follow-through. Session emphasized that relevance is the primary driver of goal engagement, as goals that connect to personal values, roles, or immediate benefits require less external motivation to initiate. Patients were guided to examine how unclear or  low-relevance goals often lead to avoidance, procrastination, or low effort, and how clarifying personal relevance can front-load motivation and reduce reliance on willpower.   A: Patient actively participated in group discussion, demonstrated understanding that motivation is often created through relevance rather than waiting to feel motivated, and was able to identify at least one personal factor that increases buy-in toward a goal. Patient showed insight into how reframing goals around personal meaning, immediate payoff, or identity can improve initiation and consistency.   P: Continue to attend PHP OT group sessions 5x week for 3 weeks to promote daily structure, social engagement, and opportunities to develop and utilize adaptive strategies to maximize functional performance in preparation for safe transition and integration back into school, work, and the community. Plan to address topic of tbd in next OT group session.                    OT Education - 08/05/24 1621     Education Details Understanding the R in SMART Goals    Person(s) Educated Patient    Methods Explanation;Handout    Comprehension Verbalized understanding           OT Short Term Goals - 07/29/24 1747       OT SHORT TERM GOAL #1  Title Client will identify and enlist the support of at least two accountability partners (family, friends, peers) to assist in the adherence to personal goals.    Time 3    Period Weeks    Status On-going    Target Date 08/04/24      OT SHORT TERM GOAL #2   Title By the time of discharge, client will independently set, track, and make progress towards a long-term goal, demonstrating resilience in overcoming obstacles and seeking support when needed.    Status On-going      OT SHORT TERM GOAL #3   Title Client will independently identify and modify three areas of the current routine that contribute to increased stress or dysfunction by the end of therapy.     Status On-going                   Plan - 08/05/24 1622     Psychosocial Skills Routines and Behaviors;Coping Strategies;Environmental  Adaptations;Habits;Interpersonal Interaction          Patient will benefit from skilled therapeutic intervention in order to improve the following deficits and impairments:       Psychosocial Skills: Routines and Behaviors, Coping Strategies, Environmental  Adaptations, Habits, Interpersonal Interaction   Visit Diagnosis: Difficulty coping    Problem List Patient Active Problem List   Diagnosis Date Noted   MDD (major depressive disorder), recurrent episode, moderate (HCC) 07/18/2024   Schizophrenia (HCC) 04/04/2024   Intermittent explosive disorder in adult 05/19/2022   GAD (generalized anxiety disorder) 05/19/2022   Aggressive behavior 05/19/2022   Aggression 05/17/2022   Altered mental status 05/17/2022   Bizarre behavior 05/17/2022   Sinus bradycardia 05/04/2022   Bradycardia 05/04/2022   Acute bilateral knee pain 02/04/2020   Chest pain 10/15/2019   Lump on neck 09/30/2019   Sciatic nerve pain, left 07/16/2019   Close exposure to COVID-19 virus 07/16/2019   Acute back pain with radiculopathy 01/22/2019   Onycholysis 11/14/2018   Palpable lymph node 10/05/2018   Cyclical mastalgia 10/05/2018   PMS (premenstrual syndrome) 08/20/2018   Vertigo 05/31/2018   Dermatofibroma of right lower leg 01/06/2017   History of borderline personality disorder 01/01/2016   Carotidynia 12/16/2015   Anxiety    PTSD (post-traumatic stress disorder) 09/30/2015   Routine screening for STI (sexually transmitted infection)    Paranoia (HCC)    Myxedema 02/26/2015   Anemia 01/30/2015   Vitamin D  deficiency 05/22/2013   Skin rash 03/05/2013   SOB (shortness of breath) on exertion 07/13/2012   Obesity 09/08/2006   Allergic rhinitis 09/08/2006   Asthma 09/08/2006   ECZEMA, ATOPIC DERMATITIS 09/08/2006    [name],  OT 08/05/2024, 4:22 PM [name], OT  [facility] [address] [place], [place], [address] Phone: [phone]   Fax:  [phone]  Name: Colleen Peck MRN: [id number] Date of Birth: 04-29-1981

## 2024-08-05 NOTE — Therapy (Signed)
 [place] [facility] [address] [place], [place], [address] Phone: [phone]   Fax:  [phone]  Occupational Therapy Treatment  Patient Details  Name: Colleen Peck MRN: [id number] Date of Birth: 11-11-1980 No data recorded  Encounter Date: 07/24/2024   OT End of Session - 08/05/24 1525     Visit Number 6    Number of Visits 15    Date for Recertification  08/04/24    OT Start Time 1230    OT Stop Time 1330    OT Time Calculation (min) 60 min    Activity Tolerance Patient tolerated treatment well    Behavior During Therapy [facility] for tasks assessed/performed          Past Medical History:  Diagnosis Date   Abnormal pap    pt reports abnl pap many years ago.  Nl since then.   Allergy    seasonal   Anemia    Asthma    Bipolar disorder (HCC)    Depression    Hypothyroidism    Palpitations 03/12/2008   Poor compliance with medication 03/08/2024   PTSD (post-traumatic stress disorder)    Seasonal allergies    Thyroid  disease 2009   Graves disease (pt reported resolved); hypothyriodism    Past Surgical History:  Procedure Laterality Date   DILATION AND CURETTAGE OF UTERUS  March 2006   LYMPH NODE BIOPSY Right 11/23/2019   Procedure: EXCISIONAL BIOPSY OF RIGHT NECK NODE;  Surgeon: Ethyl Lonni BRAVO, MD;  Location: [facility];  Service: ENT;  Laterality: Right;    There were no vitals filed for this visit.   Subjective Assessment - 08/05/24 1525     Currently in Pain? No/denies    Pain Score 0-No pain              Group Session:  S: Feeling better today.   O: During today's OT group session, the patient participated in an educational segment about the importance of goal-setting and the application of the SMART framework to enhance daily life, particularly focusing on ADLs and iADLs. The session began with five open-ended pre-session questions that facilitated group discussion and introspection about their  current relationship with goals. Following the introduction and educational segment, participants engaged in brainstorming and group discussions to devise hypothetical SMART goals. The session concluded with five post-session questions to reinforce understanding and facilitate reflection. Throughout the session, there was a range of engagement levels noted among the participants.   A:  Patient demonstrated a high level of engagement throughout the session. They actively participated in discussions, sharing personal experiences related to goal setting and challenges faced. Patient was able to clearly articulate an understanding of the SMART framework and proposed personal SMART goals related to their own ADLs with minimal assistance. They expressed enthusiasm about applying what they learned to their daily routine and appeared motivated to make changes.   P: Continue to attend PHP OT group sessions 5x week for 3 weeks to promote daily structure, social engagement, and opportunities to develop and utilize adaptive strategies to maximize functional performance in preparation for safe transition and integration back into school, work, and the community. Plan to address topic of tbd in next OT group session.                  OT Education - 08/05/24 1525     Education Details SMART Goal Review    Person(s) Educated Patient    Methods Explanation;Handout  Comprehension Verbalized understanding           OT Short Term Goals - 07/29/24 1747       OT SHORT TERM GOAL #1   Title Client will identify and enlist the support of at least two accountability partners (family, friends, peers) to assist in the adherence to personal goals.    Time 3    Period Weeks    Status On-going    Target Date 08/04/24      OT SHORT TERM GOAL #2   Title By the time of discharge, client will independently set, track, and make progress towards a long-term goal, demonstrating resilience in overcoming  obstacles and seeking support when needed.    Status On-going      OT SHORT TERM GOAL #3   Title Client will independently identify and modify three areas of the current routine that contribute to increased stress or dysfunction by the end of therapy.    Status On-going                   Plan - 08/05/24 1526     Psychosocial Skills Routines and Behaviors;Coping Strategies;Environmental  Adaptations;Habits;Interpersonal Interaction          Patient will benefit from skilled therapeutic intervention in order to improve the following deficits and impairments:       Psychosocial Skills: Routines and Behaviors, Coping Strategies, Environmental  Adaptations, Habits, Interpersonal Interaction   Visit Diagnosis: Difficulty coping    Problem List Patient Active Problem List   Diagnosis Date Noted   MDD (major depressive disorder), recurrent episode, moderate (HCC) 07/18/2024   Schizophrenia (HCC) 04/04/2024   Intermittent explosive disorder in adult 05/19/2022   GAD (generalized anxiety disorder) 05/19/2022   Aggressive behavior 05/19/2022   Aggression 05/17/2022   Altered mental status 05/17/2022   Bizarre behavior 05/17/2022   Sinus bradycardia 05/04/2022   Bradycardia 05/04/2022   Acute bilateral knee pain 02/04/2020   Chest pain 10/15/2019   Lump on neck 09/30/2019   Sciatic nerve pain, left 07/16/2019   Close exposure to COVID-19 virus 07/16/2019   Acute back pain with radiculopathy 01/22/2019   Onycholysis 11/14/2018   Palpable lymph node 10/05/2018   Cyclical mastalgia 10/05/2018   PMS (premenstrual syndrome) 08/20/2018   Vertigo 05/31/2018   Dermatofibroma of right lower leg 01/06/2017   History of borderline personality disorder 01/01/2016   Carotidynia 12/16/2015   Anxiety    PTSD (post-traumatic stress disorder) 09/30/2015   Routine screening for STI (sexually transmitted infection)    Paranoia (HCC)    Myxedema 02/26/2015   Anemia 01/30/2015    Vitamin D  deficiency 05/22/2013   Skin rash 03/05/2013   SOB (shortness of breath) on exertion 07/13/2012   Obesity 09/08/2006   Allergic rhinitis 09/08/2006   Asthma 09/08/2006   ECZEMA, ATOPIC DERMATITIS 09/08/2006    Colleen Peck, OT 08/05/2024, 3:26 PM  Colleen Peck, OT   [place] [facility] [address] [place], [place], [address] Phone: [phone]   Fax:  [phone]  Name: Colleen Peck MRN: [id number] Date of Birth: 1980/09/13

## 2024-08-05 NOTE — Therapy (Signed)
 [facility] [address] [place], [place], [address] Phone: [phone]   Fax:  [phone]  Occupational Therapy Treatment  Patient Details  Name: Colleen Peck MRN: [id number] Date of Birth: 03-15-1981 No data recorded  Encounter Date: 07/20/2024   OT End of Session - 08/05/24 1434     Visit Number 4    Number of Visits 15    Date for Recertification  08/04/24    OT Start Time 1200    OT Stop Time 1250    OT Time Calculation (min) 50 min    Activity Tolerance Patient tolerated treatment well    Behavior During Therapy [facility] for tasks assessed/performed          Past Medical History:  Diagnosis Date   Abnormal pap    pt reports abnl pap many years ago.  Nl since then.   Allergy    seasonal   Anemia    Asthma    Bipolar disorder (HCC)    Depression    Hypothyroidism    Palpitations 03/12/2008   Poor compliance with medication 03/08/2024   PTSD (post-traumatic stress disorder)    Seasonal allergies    Thyroid  disease 2009   Graves disease (pt reported resolved); hypothyriodism    Past Surgical History:  Procedure Laterality Date   DILATION AND CURETTAGE OF UTERUS  March 2006   LYMPH NODE BIOPSY Right 11/23/2019   Procedure: EXCISIONAL BIOPSY OF RIGHT NECK NODE;  Surgeon: Ethyl Lonni BRAVO, MD;  Location: [facility];  Service: ENT;  Laterality: Right;    There were no vitals filed for this visit.   Subjective Assessment - 08/05/24 1434     Currently in Pain? No/denies    Pain Score 0-No pain            Group Session:  S: Doing better today  O: Patient participated in the Starting Without Forcing It group focused on task initiation using low-pressure, manageable entry points rather than reliance on motivation. Group emphasized reducing avoidance by identifying small, realistic starting actions and recognizing momentum created through initiation. Patients were encouraged to reflect on personal  barriers to starting tasks and apply the concept to daily routines.    A: Patient demonstrated active engagement throughout the group, contributing verbally to discussion and reflecting on personal patterns related to task initiation. Patient identified realistic, low-demand strategies for starting tasks and demonstrated emerging insight into reducing pressure-based avoidance. Presentation indicates ability to apply group concepts toward functional daily routines.    P: Continue to attend PHP OT group sessions 5x week for 3 weeks to promote daily structure, social engagement, and opportunities to develop and utilize adaptive strategies to maximize functional performance in preparation for safe transition and integration back into school, work, and the community. Plan to address topic of tbd in next OT group session.                    OT Education - 08/05/24 1434     Education Details Starting Without Forcing It    Person(s) Educated Patient    Methods Explanation;Handout           OT Short Term Goals - 07/29/24 1747       OT SHORT TERM GOAL #1   Title Client will identify and enlist the support of at least two accountability partners (family, friends, peers) to assist in the adherence to personal goals.    Time 3    Period Weeks  Status On-going    Target Date 08/04/24      OT SHORT TERM GOAL #2   Title By the time of discharge, client will independently set, track, and make progress towards a long-term goal, demonstrating resilience in overcoming obstacles and seeking support when needed.    Status On-going      OT SHORT TERM GOAL #3   Title Client will independently identify and modify three areas of the current routine that contribute to increased stress or dysfunction by the end of therapy.    Status On-going                   Plan - 08/05/24 1435     Psychosocial Skills Routines and Behaviors;Coping Strategies;Environmental   Adaptations;Habits;Interpersonal Interaction          Patient will benefit from skilled therapeutic intervention in order to improve the following deficits and impairments:       Psychosocial Skills: Routines and Behaviors, Coping Strategies, Environmental  Adaptations, Habits, Interpersonal Interaction   Visit Diagnosis: Difficulty coping    Problem List Patient Active Problem List   Diagnosis Date Noted   MDD (major depressive disorder), recurrent episode, moderate (HCC) 07/18/2024   Schizophrenia (HCC) 04/04/2024   Intermittent explosive disorder in adult 05/19/2022   GAD (generalized anxiety disorder) 05/19/2022   Aggressive behavior 05/19/2022   Aggression 05/17/2022   Altered mental status 05/17/2022   Bizarre behavior 05/17/2022   Sinus bradycardia 05/04/2022   Bradycardia 05/04/2022   Acute bilateral knee pain 02/04/2020   Chest pain 10/15/2019   Lump on neck 09/30/2019   Sciatic nerve pain, left 07/16/2019   Close exposure to COVID-19 virus 07/16/2019   Acute back pain with radiculopathy 01/22/2019   Onycholysis 11/14/2018   Palpable lymph node 10/05/2018   Cyclical mastalgia 10/05/2018   PMS (premenstrual syndrome) 08/20/2018   Vertigo 05/31/2018   Dermatofibroma of right lower leg 01/06/2017   History of borderline personality disorder 01/01/2016   Carotidynia 12/16/2015   Anxiety    PTSD (post-traumatic stress disorder) 09/30/2015   Routine screening for STI (sexually transmitted infection)    Paranoia (HCC)    Myxedema 02/26/2015   Anemia 01/30/2015   Vitamin D  deficiency 05/22/2013   Skin rash 03/05/2013   SOB (shortness of breath) on exertion 07/13/2012   Obesity 09/08/2006   Allergic rhinitis 09/08/2006   Asthma 09/08/2006   ECZEMA, ATOPIC DERMATITIS 09/08/2006    Colleen Peck, OT 08/05/2024, 2:35 PM Colleen Peck, OT   [facility] [address] [place], [place], [address] Phone: [phone]   Fax:   [phone]  Name: Colleen Peck MRN: [id number] Date of Birth: 1980-12-02

## 2024-08-05 NOTE — Therapy (Signed)
 [facility] [address] [place], [place], [address] Phone: [phone]   Fax:  [phone]  Occupational Therapy Treatment  Patient Details  Name: Colleen Peck MRN: [id number] Date of Birth: 1980-08-10 No data recorded  Encounter Date: 07/23/2024   OT End of Session - 08/05/24 1517     Visit Number 5    Number of Visits 15    Date for Recertification  08/04/24    OT Start Time 1230    OT Stop Time 1330    OT Time Calculation (min) 60 min    Activity Tolerance Patient tolerated treatment well          Past Medical History:  Diagnosis Date   Abnormal pap    pt reports abnl pap many years ago.  Nl since then.   Allergy    seasonal   Anemia    Asthma    Bipolar disorder (HCC)    Depression    Hypothyroidism    Palpitations 03/12/2008   Poor compliance with medication 03/08/2024   PTSD (post-traumatic stress disorder)    Seasonal allergies    Thyroid  disease 2009   Graves disease (pt reported resolved); hypothyriodism    Past Surgical History:  Procedure Laterality Date   DILATION AND CURETTAGE OF UTERUS  March 2006   LYMPH NODE BIOPSY Right 11/23/2019   Procedure: EXCISIONAL BIOPSY OF RIGHT NECK NODE;  Surgeon: Ethyl Lonni BRAVO, MD;  Location: [facility];  Service: ENT;  Laterality: Right;    There were no vitals filed for this visit.   Subjective Assessment - 08/05/24 1517     Currently in Pain? No/denies    Pain Score 0-No pain             Group Session:  S: A little better today.   O: Patient participated in a structured Monday morning Goal Check-In group focused on reviewing previously identified SMART goals, with emphasis on the relevance and personal meaning of each goal as a driver of motivation and follow-through. The group provided an opportunity for patients to reflect on goal progress, identify barriers encountered over the weekend, and clarify next steps for the upcoming week  through guided discussion and peer sharing.   A: Patient demonstrated active engagement throughout the Goal Check-In group, contributing verbally to discussion, reflecting on personal goal progress, and responding appropriately to prompts. Patient displayed intact attention, goal-directed thought process, and emerging insight into barriers and facilitators impacting goal follow-through. Presentation indicates ability to self-monitor progress and apply group content toward upcoming goal planning      P: Continue to attend PHP OT group sessions 5x week for 3 weeks to promote daily structure, social engagement, and opportunities to develop and utilize adaptive strategies to maximize functional performance in preparation for safe transition and integration back into school, work, and the community. Plan to address topic of tbd in next OT group session.                   OT Education - 08/05/24 1517     Education Details Monday Goal Check In    Person(s) Educated Patient    Methods Explanation;Handout    Comprehension Verbalized understanding           OT Short Term Goals - 07/29/24 1747       OT SHORT TERM GOAL #1   Title Client will identify and enlist the support of at least two accountability partners (family, friends, peers) to assist  in the adherence to personal goals.    Time 3    Period Weeks    Status On-going    Target Date 08/04/24      OT SHORT TERM GOAL #2   Title By the time of discharge, client will independently set, track, and make progress towards a long-term goal, demonstrating resilience in overcoming obstacles and seeking support when needed.    Status On-going      OT SHORT TERM GOAL #3   Title Client will independently identify and modify three areas of the current routine that contribute to increased stress or dysfunction by the end of therapy.    Status On-going                   Plan - 08/05/24 1517     Psychosocial Skills  Routines and Behaviors;Coping Strategies;Environmental  Adaptations;Habits;Interpersonal Interaction          Patient will benefit from skilled therapeutic intervention in order to improve the following deficits and impairments:       Psychosocial Skills: Routines and Behaviors, Coping Strategies, Environmental  Adaptations, Habits, Interpersonal Interaction   Visit Diagnosis: Difficulty coping    Problem List Patient Active Problem List   Diagnosis Date Noted   MDD (major depressive disorder), recurrent episode, moderate (HCC) 07/18/2024   Schizophrenia (HCC) 04/04/2024   Intermittent explosive disorder in adult 05/19/2022   GAD (generalized anxiety disorder) 05/19/2022   Aggressive behavior 05/19/2022   Aggression 05/17/2022   Altered mental status 05/17/2022   Bizarre behavior 05/17/2022   Sinus bradycardia 05/04/2022   Bradycardia 05/04/2022   Acute bilateral knee pain 02/04/2020   Chest pain 10/15/2019   Lump on neck 09/30/2019   Sciatic nerve pain, left 07/16/2019   Close exposure to COVID-19 virus 07/16/2019   Acute back pain with radiculopathy 01/22/2019   Onycholysis 11/14/2018   Palpable lymph node 10/05/2018   Cyclical mastalgia 10/05/2018   PMS (premenstrual syndrome) 08/20/2018   Vertigo 05/31/2018   Dermatofibroma of right lower leg 01/06/2017   History of borderline personality disorder 01/01/2016   Carotidynia 12/16/2015   Anxiety    PTSD (post-traumatic stress disorder) 09/30/2015   Routine screening for STI (sexually transmitted infection)    Paranoia (HCC)    Myxedema 02/26/2015   Anemia 01/30/2015   Vitamin D  deficiency 05/22/2013   Skin rash 03/05/2013   SOB (shortness of breath) on exertion 07/13/2012   Obesity 09/08/2006   Allergic rhinitis 09/08/2006   Asthma 09/08/2006   ECZEMA, ATOPIC DERMATITIS 09/08/2006    Colleen Peck, OT 08/05/2024, 3:18 PM Colleen Peck, OT   [facility] [address] [place], [place], [address] Phone: [phone]   Fax:  [phone]  Name: Colleen Peck MRN: [id number] Date of Birth: Nov 17, 1980

## 2024-08-06 ENCOUNTER — Telehealth (HOSPITAL_COMMUNITY): Payer: Self-pay | Admitting: Professional

## 2024-08-06 ENCOUNTER — Ambulatory Visit (HOSPITAL_COMMUNITY)

## 2024-08-06 ENCOUNTER — Telehealth (HOSPITAL_COMMUNITY): Payer: Self-pay | Admitting: Licensed Clinical Social Worker

## 2024-08-06 ENCOUNTER — Ambulatory Visit (HOSPITAL_COMMUNITY): Admitting: Licensed Clinical Social Worker

## 2024-08-06 DIAGNOSIS — F331 Major depressive disorder, recurrent, moderate: Secondary | ICD-10-CM

## 2024-08-06 DIAGNOSIS — R4589 Other symptoms and signs involving emotional state: Secondary | ICD-10-CM

## 2024-08-06 DIAGNOSIS — F209 Schizophrenia, unspecified: Secondary | ICD-10-CM

## 2024-08-06 NOTE — Psych (Signed)
 Virtual Visit via Video Note  I connected with Colleen Peck on 08/06/24 at  9:00 AM EST by a video enabled telemedicine application and verified that I am speaking with the correct person using two identifiers.  Location: Patient: pt's home in [place], [place] Provider: clinical home office in [place], [place]   I discussed the limitations of evaluation and management by telemedicine and the availability of in person appointments. The patient expressed understanding and agreed to proceed.    I discussed the assessment and treatment plan with the patient. The patient was provided an opportunity to ask questions and all were answered. The patient agreed with the plan and demonstrated an understanding of the instructions.   The patient was advised to call back or seek an in-person evaluation if the symptoms worsen or if the condition fails to improve as anticipated.  I provided 240 minutes of non-face-to-face time during this encounter.   Will LILLETTE Pollack, LCSW    [facility] PHP THERAPIST PROGRESS NOTE  Colleen Peck [id number]   Session Time: 9:00 am - 10:00 am  Participation Level: Active  Behavioral Response: CasualAlertDepressed and Dysphoric  Type of Therapy: Group Therapy  Treatment Goals addressed: Coping  Progress Towards Goals: Progressing minimally  Interventions: CBT, DBT, Solution Focused, Strength-based, Supportive, and Reframing  Therapist Response: Clinician led check-in regarding current stressors and situation, and review of patient completed daily inventory. Clinician utilized active listening and empathetic response and validated patient emotions. Clinician facilitated processing group on pertinent issues.?   Summary: Patient arrived within time allowed.?Patient rates their depression at a 7-8  and anxiety at a 4 on a scale of 1-10 with 10 being best. Pt reports I feel lazy. I feel overwhelmed. She reports she continues to feel afraid that shes not gonna  be able to go back to work, as well as continued difficulty accepting her diagnosis. She states she saw a psychiatrist about disability. I just want to feel better. When asked about sleep and appetite, pt reports they slept 8 hours last night and ate normal meals yesterday. Pt denied experiencing SI/SH thoughts since last session and endorses ongoing feelings of hopelessness. Pt able to process.?Pt engaged in discussion.?     Session Time: 10:00 am - 11:00 am  Participation Level: Active  Behavioral Response: CasualAlertDepressed and Dysphoric  Type of Therapy: Group Therapy  Treatment Goals addressed: Coping  Progress Towards Goals: Progressing minimally  Interventions: CBT, DBT, Solution Focused, Strength-based, Supportive, and Reframing  Therapist Response: Clinician shared a two-part video describing the Wall of Awful, a metaphor to explain why simple tasks feel difficult. It explores the emotional barriers built by past failures and anxieties, hindering progress. Clinician utilized ACT principles to inform discussion.    Summary: Pt engaged in discussion. She reports she had trouble concentrating and did not get much from the video. Pt frequently verbalizes negative self-judgement when sharing thoughts and feelings, and cln requested pt take note when she does and make an effort to extend compassion to herself.    Session Time: 11:00 am - 12:00 pm  Participation Level: Active  Behavioral Response: CasualAlertDepressed and Dysphoric  Type of Therapy: Group Therapy  Treatment Goals addressed: Coping  Progress Towards Goals: Progressing minimally  Interventions: CBT, DBT, Solution Focused, Strength-based, Supportive, and Reframing  Therapist Response: Clinician guided patients through an exercise called perspective-taking in which patients were asked to visualize themselves holding onto something that is currently causing them pain and to think of it as a memory.  Patients were invited to share their thoughts and feelings after the exercise was completed. Clinician utilized ACT principles to inform discussion.  Summary: Pt engaged in discussion. Pt reports she was unable to focus and did not get anything from the exercise due to rumination. Cln attempted to engage pt with the present moment and identify small, positive things, as well as to identify activities she can engage in that may be enjoyable. Pt resistant, stating she is unable to do any of the things she enjoys and verbalizes, I sound like I'm making excuses.    Session Time: 12:00 pm - 1:00 pm  Participation Level: Active  Behavioral Response: CasualAlertDepressed and Dysphoric  Type of Therapy: Group Therapy  Treatment Goals addressed: Coping  Progress Towards Goals: Progressing minimally  Interventions: CBT, DBT, Solution Focused, Strength-based, Supportive, and Reframing  Therapist Response: 12:00 pm - 12:50 pm: Group was led by occupational therapist, [name]. 12:50 - 1:00 pm: Clinician led check-out. Clinician assessed for immediate needs, medication compliance and efficacy, and safety concerns?  Summary: 12:00 pm - 12:50 pm: Pt engaged and participated in discussion. 12:50 - 1:00 pm: At check-out, patient contracts for safety.?Patient demonstrates limited progress as evidenced by her continued engagement as well as difficulty with problem-solving and insight at times. Patient denies SI/HI/self-harm thoughts at the end of group and agrees to seek help should those thoughts/feelings occur.?   Suicidal/Homicidal: Nowithout intent/plan  Plan: ?Pt will continue in PHP and medication management while continuing to work on decreasing depression symptoms,?SI, and anxiety symptoms,?and increasing the ability to self manage symptoms.      Collaboration of Care: Medication Management AEB Staci Kerns, NP  Patient/Guardian was advised Release of Information must be obtained prior to  any record release in order to collaborate their care with an outside provider. Patient/Guardian was advised if they have not already done so to contact the registration department to sign all necessary forms in order for us  to release information regarding their care.   Consent: Patient/Guardian gives verbal consent for treatment and assignment of benefits for services provided during this visit. Patient/Guardian expressed understanding and agreed to proceed.   Diagnosis: Schizophrenia, unspecified type (HCC) [F20.9]    1. Schizophrenia, unspecified type (HCC)   2. MDD (major depressive disorder), recurrent episode, moderate (HCC)       Will LILLETTE Pollack, LCSW 08/06/2024

## 2024-08-06 NOTE — Telephone Encounter (Signed)
"  See call log  "

## 2024-08-07 ENCOUNTER — Telehealth (HOSPITAL_COMMUNITY): Payer: Self-pay | Admitting: Licensed Clinical Social Worker

## 2024-08-07 ENCOUNTER — Ambulatory Visit (HOSPITAL_COMMUNITY)

## 2024-08-07 ENCOUNTER — Ambulatory Visit (HOSPITAL_COMMUNITY): Admitting: Professional

## 2024-08-07 DIAGNOSIS — R4589 Other symptoms and signs involving emotional state: Secondary | ICD-10-CM

## 2024-08-07 DIAGNOSIS — F209 Schizophrenia, unspecified: Secondary | ICD-10-CM

## 2024-08-07 NOTE — Telephone Encounter (Signed)
 Cln reached out to pt's mom and legal guardian, Colleen Peck, at number in chart, to discuss discharge plan as discussed by [facility] treatment team. Cln shared plan of pt d/c PHP on Fri 1/30 and following-up with [facility] for therapy and psychiatry. Cln informed Rock that the front desk is working to schedule therapy appt currently and follow-up appt with Dr Chien is already scheduled. Cln reports pt has been informed of plan and reports comfort with it. Rock denies questions or concerns and reports alignment and consent with discharge plan.

## 2024-08-07 NOTE — Psych (Signed)
 Virtual Visit via Video Note  I connected with Erminio Gearing on 08/07/24 at  9:00 AM EST by a video enabled telemedicine application and verified that I am speaking with the correct person using two identifiers.  Location: Patient: Home Provider: Clinical Home Office   I discussed the limitations of evaluation and management by telemedicine and the availability of in person appointments. The patient expressed understanding and agreed to proceed.  Follow Up Instructions:    I discussed the assessment and treatment plan with the patient. The patient was provided an opportunity to ask questions and all were answered. The patient agreed with the plan and demonstrated an understanding of the instructions.   The patient was advised to call back or seek an in-person evaluation if the symptoms worsen or if the condition fails to improve as anticipated.  I provided 240 minutes of non-face-to-face time during this encounter.   [name], [facility]   [facility] BH PHP THERAPIST PROGRESS NOTE  Brienna Bass [id number]   Session Time: 9:00 am - 10:00 am  Participation Level: Active  Behavioral Response: CasualAlertAnxious and Depressed  Type of Therapy: Group Therapy  Treatment Goals addressed: Coping  Progress Towards Goals: Progressing  Interventions: CBT, DBT, Solution Focused, Strength-based, Supportive, and Reframing  Therapist Response: Clinician led check-in regarding current stressors and situation, and review of patient completed daily inventory. Clinician utilized active listening and empathetic response and validated patient emotions. Clinician facilitated processing group on pertinent issues.?   Summary: Patient arrived within time allowed. Patient rates their depression at a 5 and their anxiety at a 2.5 on a scale of 1-10 with 10 being best. When asked about sleep and appetite, pt reports they slept 7 hours and ate 2x yesterday. Pt denies any SI/HI/feelings of hopelessness  or worthlessness. She reports she did take a shower and washed her hair yesterday and listened to some audio books on [employer]. Patient able to process. Patient engaged in discussion.          Session Time: 10:00 am - 11:00 am   Participation Level: Active   Behavioral Response: Casual Alert and Anxious/Depressed   Type of Therapy: Group Therapy   Treatment Goals addressed: Coping   Progress Towards Goals: Progressing   Interventions: CBT, DBT, Solution Focused, Strength-based, Supportive, and Reframing   Therapist Response: Clinician led processing group for pt's current struggles. Group members shared stressors and provided support and feedback. Clinician brought in topics of should statements, communication, and vocalizing personal needs/wants.   Summary: Pt able to process and provide support to group. Pt reports she has gotten better at using her filter instead of being blunt and blurting out whatever she thinks.   Session Time: 11:00 am - 12:00 pm   Participation Level: Active   Behavioral Response: Casual Alert and Anxious/Depressed   Type of Therapy: Group Therapy   Treatment Goals addressed: Coping   Progress Towards Goals: Progressing   Interventions: CBT, DBT, Solution Focused, Strength-based, Supportive, and Reframing   Therapist Response: Group was led by [employer] chaplain, Amy Delores.   Summary: Pt engaged in discussion.        Session Time: 12:00 pm - 12:50 pm   Participation Level: Active   Behavioral Response: Casual Alert and Anxious/Depressed   Type of Therapy: Group Therapy   Treatment Goals addressed: Coping   Progress Towards Goals: Progressing   Interventions: OT group   Therapist Response: Group was led by occupational therapist, Edward Hollan.    Summary: Pt  engaged in discussion.       Session Time: 12:50 pm - 1:00 pm   Participation Level: Active   Behavioral Response: Casual Alert and Anxious/Depressed   Type  of Therapy: Group Therapy   Treatment Goals addressed: Coping   Progress Towards Goals: Progressing   Interventions: CBT, DBT, Solution Focused, Strength-based, Supportive, and Reframing   Therapist Response: Clinician led check-out. Clinician assessed for immediate needs, medication compliance and efficacy, and safety concerns?   Summary: At check-out, patient reports no immediate concerns. Patient demonstrates progress as evidenced by engagement and responsiveness to treatment. Patient denies SI/HI/self-harm thoughts at the end of group.   Suicidal/Homicidal: Nowithout intent/plan  Plan: ?Pt will continue in PHP and medication management while continuing to work on decreasing depression symptoms,?SI, and anxiety symptoms,?and increasing the ability to self manage symptoms.     Collaboration of Care: Medication Management AEB Staci Kerns, NP  Patient/Guardian was advised Release of Information must be obtained prior to any record release in order to collaborate their care with an outside provider. Patient/Guardian was advised if they have not already done so to contact the registration department to sign all necessary forms in order for us  to release information regarding their care.   Consent: Patient/Guardian gives verbal consent for treatment and assignment of benefits for services provided during this visit. Patient/Guardian expressed understanding and agreed to proceed.   Diagnosis: Schizophrenia, unspecified type (HCC) [F20.9]    1. Schizophrenia, unspecified type [facility])     [name], [facility] 08/07/2024

## 2024-08-08 ENCOUNTER — Ambulatory Visit (HOSPITAL_COMMUNITY)

## 2024-08-08 ENCOUNTER — Encounter (HOSPITAL_COMMUNITY): Payer: Self-pay

## 2024-08-08 NOTE — Therapy (Signed)
 [facility] [address] [place], [place], [address] Phone: [phone]   Fax:  [phone]  Occupational Therapy Treatment  Patient Details  Name: Colleen Peck MRN: [id number] Date of Birth: 03/09/1981 No data recorded  Encounter Date: 07/27/2024   OT End of Session - 08/08/24 2054     Visit Number 8    Number of Visits 15    Date for Recertification  08/04/24    OT Start Time 1200    OT Stop Time 1250    OT Time Calculation (min) 50 min          Past Medical History:  Diagnosis Date   Abnormal pap    pt reports abnl pap many years ago.  Nl since then.   Allergy    seasonal   Anemia    Asthma    Bipolar disorder (HCC)    Depression    Hypothyroidism    Palpitations 03/12/2008   Poor compliance with medication 03/08/2024   PTSD (post-traumatic stress disorder)    Seasonal allergies    Thyroid  disease 2009   Graves disease (pt reported resolved); hypothyriodism    Past Surgical History:  Procedure Laterality Date   DILATION AND CURETTAGE OF UTERUS  March 2006   LYMPH NODE BIOPSY Right 11/23/2019   Procedure: EXCISIONAL BIOPSY OF RIGHT NECK NODE;  Surgeon: Ethyl Lonni BRAVO, MD;  Location: [facility];  Service: ENT;  Laterality: Right;    There were no vitals filed for this visit.   Subjective Assessment - 08/08/24 2053     Currently in Pain? No/denies    Pain Score 0-No pain            Group Session:  S: I'm feeling a little bit better I think.   O: Group focused on the R (Relevant) component of SMART goals to improve motivation and follow-through. Session emphasized that relevance is the primary driver of goal engagement, as goals that connect to personal values, roles, or immediate benefits require less external motivation to initiate. Patients were guided to examine how unclear or low-relevance goals often lead to avoidance, procrastination, or low effort, and how clarifying personal  relevance can front-load motivation and reduce reliance on willpower.   A: Patient actively participated in group discussion, demonstrated understanding that motivation is often created through relevance rather than waiting to feel motivated, and was able to identify at least one personal factor that increases buy-in toward a goal. Patient showed insight into how reframing goals around personal meaning, immediate payoff, or identity can improve initiation and consistency.   P: Continue to attend PHP OT group sessions 5x week for 3 weeks to promote daily structure, social engagement, and opportunities to develop and utilize adaptive strategies to maximize functional performance in preparation for safe transition and integration back into school, work, and the community. Plan to address topic of tbd in next OT group session.                    OT Education - 08/08/24 2053     Education Details Understanding the R in SMART Goals    Person(s) Educated Patient    Methods Explanation;Handout    Comprehension Verbalized understanding           OT Short Term Goals - 07/29/24 1747       OT SHORT TERM GOAL #1   Title Client will identify and enlist the support of at least two accountability partners (family, friends,  peers) to assist in the adherence to personal goals.    Time 3    Period Weeks    Status On-going    Target Date 08/04/24      OT SHORT TERM GOAL #2   Title By the time of discharge, client will independently set, track, and make progress towards a long-term goal, demonstrating resilience in overcoming obstacles and seeking support when needed.    Status On-going      OT SHORT TERM GOAL #3   Title Client will independently identify and modify three areas of the current routine that contribute to increased stress or dysfunction by the end of therapy.    Status On-going                   Plan - 08/08/24 2054     Psychosocial Skills Routines and  Behaviors;Coping Strategies;Environmental  Adaptations;Habits;Interpersonal Interaction          Patient will benefit from skilled therapeutic intervention in order to improve the following deficits and impairments:       Psychosocial Skills: Routines and Behaviors, Coping Strategies, Environmental  Adaptations, Habits, Interpersonal Interaction   Visit Diagnosis: Difficulty coping    Problem List Patient Active Problem List   Diagnosis Date Noted   MDD (major depressive disorder), recurrent episode, moderate (HCC) 07/18/2024   Schizophrenia (HCC) 04/04/2024   Intermittent explosive disorder in adult 05/19/2022   GAD (generalized anxiety disorder) 05/19/2022   Aggressive behavior 05/19/2022   Aggression 05/17/2022   Altered mental status 05/17/2022   Bizarre behavior 05/17/2022   Sinus bradycardia 05/04/2022   Bradycardia 05/04/2022   Acute bilateral knee pain 02/04/2020   Chest pain 10/15/2019   Lump on neck 09/30/2019   Sciatic nerve pain, left 07/16/2019   Close exposure to COVID-19 virus 07/16/2019   Acute back pain with radiculopathy 01/22/2019   Onycholysis 11/14/2018   Palpable lymph node 10/05/2018   Cyclical mastalgia 10/05/2018   PMS (premenstrual syndrome) 08/20/2018   Vertigo 05/31/2018   Dermatofibroma of right lower leg 01/06/2017   History of borderline personality disorder 01/01/2016   Carotidynia 12/16/2015   Anxiety    PTSD (post-traumatic stress disorder) 09/30/2015   Routine screening for STI (sexually transmitted infection)    Paranoia (HCC)    Myxedema 02/26/2015   Anemia 01/30/2015   Vitamin D  deficiency 05/22/2013   Skin rash 03/05/2013   SOB (shortness of breath) on exertion 07/13/2012   Obesity 09/08/2006   Allergic rhinitis 09/08/2006   Asthma 09/08/2006   ECZEMA, ATOPIC DERMATITIS 09/08/2006    Colleen Peck, OT 08/08/2024, 8:55 PM  Colleen Peck, OT    [facility] [address] [place], [place], [address] Phone: [phone]   Fax:  [phone]  Name: Colleen Peck MRN: [id number] Date of Birth: 1980-12-18

## 2024-08-08 NOTE — Therapy (Signed)
 [facility] [address] [place], [place], [address] Phone: [phone]   Fax:  [phone]  Occupational Therapy Treatment  Patient Details  Name: Colleen Peck MRN: [id number] Date of Birth: June 23, 1981 No data recorded  Encounter Date: 08/03/2024   OT End of Session - 08/08/24 2143     Visit Number 9    Number of Visits 15    Date for Recertification  08/04/24    OT Start Time 1200    OT Stop Time 1250    OT Time Calculation (min) 50 min    Activity Tolerance Patient tolerated treatment well    Behavior During Therapy [facility] for tasks assessed/performed          Past Medical History:  Diagnosis Date   Abnormal pap    pt reports abnl pap many years ago.  Nl since then.   Allergy    seasonal   Anemia    Asthma    Bipolar disorder (HCC)    Depression    Hypothyroidism    Palpitations 03/12/2008   Poor compliance with medication 03/08/2024   PTSD (post-traumatic stress disorder)    Seasonal allergies    Thyroid  disease 2009   Graves disease (pt reported resolved); hypothyriodism    Past Surgical History:  Procedure Laterality Date   DILATION AND CURETTAGE OF UTERUS  March 2006   LYMPH NODE BIOPSY Right 11/23/2019   Procedure: EXCISIONAL BIOPSY OF RIGHT NECK NODE;  Surgeon: Ethyl Lonni BRAVO, MD;  Location: [facility];  Service: ENT;  Laterality: Right;    There were no vitals filed for this visit.   Subjective Assessment - 08/08/24 2142     Currently in Pain? No/denies    Pain Score 0-No pain    Multiple Pain Sites No             Group Session:  S: Sleep is getting better.   O: During the group therapy session, the occupational therapist discussed the impact of sleep disturbances on daily activities and overall health and wellbeing.   The OT also reviewed various types of sleep disorders, including insomnia, sleep apnea, restless leg syndrome, and narcolepsy, and their associated symptoms.  Strategies for managing and treating sleep disturbances were also discussed, such as establishing a consistent sleep routine, avoiding stimulants before bedtime, and engaging in relaxation techniques.   Today's group also included information on how sleep disturbances can cause fatigue, mood changes, cognitive impairment, and physical health problems, and emphasizes the importance of seeking prompt treatment to maintain overall health and wellbeing.   A: In today's session, the patient demonstrated active engagement with the topic of The Importance of Sleep. They eagerly asked questions, contributed personal experiences, and showcased a noticeable eagerness to apply the discussed principles. Their participation indicated not only a strong understanding of the subject matter but also an intrinsic motivation to implement better sleep practices in their daily routine. Based on their proactive involvement, it is assessed that the patient greatly benefited from today's treatment and will likely make efforts to incorporate the insights gained.   P: Continue to attend PHP OT group sessions 5x week for 3 weeks to promote daily structure, social engagement, and opportunities to develop and utilize adaptive strategies to maximize functional performance in preparation for safe transition and integration back into school, work, and the community. Plan to address topic of tbd in next OT group session.  OT Education - 08/08/24 2142     Education Details Sleep Stratigies    [employer]) Educated Patient    Methods Explanation;Handout    Comprehension Verbalized understanding           OT Short Term Goals - 07/29/24 1747       OT SHORT TERM GOAL #1   Title Client will identify and enlist the support of at least two accountability partners (family, friends, peers) to assist in the adherence to personal goals.    Time 3    Period Weeks    Status On-going    Target Date  08/04/24      OT SHORT TERM GOAL #2   Title By the time of discharge, client will independently set, track, and make progress towards a long-term goal, demonstrating resilience in overcoming obstacles and seeking support when needed.    Status On-going      OT SHORT TERM GOAL #3   Title Client will independently identify and modify three areas of the current routine that contribute to increased stress or dysfunction by the end of therapy.    Status On-going                   Plan - 08/08/24 2144     Psychosocial Skills Routines and Behaviors;Coping Strategies;Environmental  Adaptations;Habits;Interpersonal Interaction          Patient will benefit from skilled therapeutic intervention in order to improve the following deficits and impairments:       Psychosocial Skills: Routines and Behaviors, Coping Strategies, Environmental  Adaptations, Habits, Interpersonal Interaction   Visit Diagnosis: Difficulty coping    Problem List Patient Active Problem List   Diagnosis Date Noted   MDD (major depressive disorder), recurrent episode, moderate (HCC) 07/18/2024   Schizophrenia (HCC) 04/04/2024   Intermittent explosive disorder in adult 05/19/2022   GAD (generalized anxiety disorder) 05/19/2022   Aggressive behavior 05/19/2022   Aggression 05/17/2022   Altered mental status 05/17/2022   Bizarre behavior 05/17/2022   Sinus bradycardia 05/04/2022   Bradycardia 05/04/2022   Acute bilateral knee pain 02/04/2020   Chest pain 10/15/2019   Lump on neck 09/30/2019   Sciatic nerve pain, left 07/16/2019   Close exposure to COVID-19 virus 07/16/2019   Acute back pain with radiculopathy 01/22/2019   Onycholysis 11/14/2018   Palpable lymph node 10/05/2018   Cyclical mastalgia 10/05/2018   PMS (premenstrual syndrome) 08/20/2018   Vertigo 05/31/2018   Dermatofibroma of right lower leg 01/06/2017   History of borderline personality disorder 01/01/2016   Carotidynia  12/16/2015   Anxiety    PTSD (post-traumatic stress disorder) 09/30/2015   Routine screening for STI (sexually transmitted infection)    Paranoia (HCC)    Myxedema 02/26/2015   Anemia 01/30/2015   Vitamin D  deficiency 05/22/2013   Skin rash 03/05/2013   SOB (shortness of breath) on exertion 07/13/2012   Obesity 09/08/2006   Allergic rhinitis 09/08/2006   Asthma 09/08/2006   ECZEMA, ATOPIC DERMATITIS 09/08/2006    [name], OT 08/08/2024, 9:44 PM  [name], OT   [facility] [address] [place], [place], [address] Phone: [phone]   Fax:  [phone]  Name: Dyllan Kats MRN: [id number] Date of Birth: 06/05/1981

## 2024-08-09 ENCOUNTER — Ambulatory Visit (HOSPITAL_COMMUNITY)

## 2024-08-09 ENCOUNTER — Telehealth (HOSPITAL_COMMUNITY): Payer: Self-pay | Admitting: Professional

## 2024-08-09 NOTE — Telephone Encounter (Signed)
"  See call log  "

## 2024-08-10 ENCOUNTER — Ambulatory Visit (INDEPENDENT_AMBULATORY_CARE_PROVIDER_SITE_OTHER): Admitting: Professional

## 2024-08-10 ENCOUNTER — Ambulatory Visit (HOSPITAL_COMMUNITY)

## 2024-08-10 DIAGNOSIS — F209 Schizophrenia, unspecified: Secondary | ICD-10-CM

## 2024-08-10 DIAGNOSIS — R4589 Other symptoms and signs involving emotional state: Secondary | ICD-10-CM

## 2024-08-10 NOTE — Progress Notes (Signed)
" °  [facility] Intensive Outpatient Program Discharge Summary  Colleen Peck [id number]  Admission date: 07/17/2024 Discharge date: 08/10/2024  Reason for admission: per admission assessment note:Colleen Peck is a 44 year old African-American female who presents to partial outpatient hospitalization programming.  She carries a diagnosis with schizophrenia, major depressive disorder and generalized anxiety disorder.  Patient was seen and evaluated face-to-face.  Stated increased depression due to recent diagnosis related to schizophrenia.  Reports she noticed her symptoms started roughly 1 month ago.  Reports her mother is currently her legal guardian which has caused a lot of grief.  Family of Origin Issues: Currently has legal guardian for medical decision making.  Progress in Program Toward Treatment Goals: Progressing patient attended and paricpated with daily group session with active and engaged participation.  Continues to endorse passive suicidal ideations denying plan or intent.  Reports overwhelming frustration related to guardianship and forced medication requirements.  Patient previously reported medication side effects related to Haldol  decanoate.  Per patient's recent evaluation with current psychiatrist Chien discussed discontinuing Haldol  patient to be initiated on Invega .  Discussed taking p.o. medication on February 7 as directed by clinical notes.  Reiterated medication management directions at discharge.  She was receptive to plan  Collaboration of Care: Medication Management AEB patient to start po medication in 1 week, clinica plan to d/c Haldol    Patient/Guardian was advised Release of Information must be obtained prior to any record release in order to collaborate their care with an outside provider. Patient/Guardian was advised if they have not already done so to contact the registration department to sign all necessary forms in order for us  to release  information regarding their care.   Consent: Patient/Guardian gives verbal consent for treatment and assignment of benefits for services provided during this visit. Patient/Guardian expressed understanding and agreed to proceed.   Staci Kerns NP  08/10/2024 "

## 2024-08-10 NOTE — Psych (Signed)
 " [facility] BH PHP THERAPIST PROGRESS NOTE  Miyo Aina [id number]   Session Time: 9:00 am - 10:00 am  Participation Level: Active  Behavioral Response: CasualAlertAnxious and Depressed  Type of Therapy: Group Therapy  Treatment Goals addressed: Coping  Progress Towards Goals: Progressing  Interventions: CBT, DBT, Solution Focused, Strength-based, Supportive, and Reframing  Therapist Response: Clinician led check-in regarding current stressors and situation, and review of patient completed daily inventory. Clinician utilized active listening and empathetic response and validated patient emotions. Clinician facilitated processing group on pertinent issues.?   Summary: Patient arrived within time allowed. Patient rates their depression at a 3 and their anxiety at a 3 on a scale of 1-10 with 10 being best. When asked about sleep and appetite, pt reports they slept 10+ hours and ate 3x yesterday. Pt denies any SI/HI/feelings of hopelessness or worthlessness. She reports she is worried about discharging today and not having a set routine to be out of the house. Patient able to process. Patient engaged in discussion.          Session Time: 10:00 am - 11:00 am   Participation Level: Active   Behavioral Response: CasualAlertDepressed   Type of Therapy: Group Therapy   Treatment Goals addressed: Coping   Progress Towards Goals: Progressing   Interventions: CBT, DBT, Solution Focused, Strength-based, Supportive, and Reframing   Summary: Cln led processing group for pt's current struggles. Group members shared stressors and provided support and feedback. Cln brought in topics of boundaries, radical acceptance, finding joy in the small things, and focusing on what is within our control to inform discussion.    Summary:  Pt able to process and provide support to group.     Session Time: 11:00 am - 12:00 pm   Participation Level: Active   Behavioral Response: CasualAlertAnxious    Type of Therapy: Group Therapy   Treatment Goals addressed: Coping   Progress Towards Goals: Progressing   Interventions: CBT, DBT, Solution Focused, Strength-based, Supportive, and Reframing   Therapist Response: Clinician introduced topic of Positive Psychology. Group watched Positive Psychology Ted-Talk. Patients discussed how their lens of life affects the way they feel.    Therapist Response:  Pt engaged in discussion. Pt reports they relates to moving the goal post of success and happiness.   Pt identifies writing 3 gratitudes as a way to help change their lens.         Session Time: 12:00 pm - 12:50 pm   Participation Level: Active   Behavioral Response: CasualAlertAnxious   Type of Therapy: Group Therapy   Treatment Goals addressed: Coping   Progress Towards Goals: Progressing   Interventions: Occupational Therapy   Therapist Response: Cln E Hollan led occupational therapy group.    Summary: See note      Session Time: 12:50 pm - 1:00 pm   Participation Level: Active   Behavioral Response: CasualAlertAnxious   Type of Therapy: Group Therapy   Treatment Goals addressed: Coping   Progress Towards Goals: Progressing   Interventions: CBT, DBT, Solution Focused, Strength-based, Supportive, and Reframing   Therapist Response: 12:50 pm - 1:00 pm: Clinician led check-out. Clinician assessed for immediate needs, medication compliance and efficacy, and safety concerns?   Summary: 12:50 pm - 1:00 pm: At check-out, patient reports no immediate concerns. Patient demonstrates progress as evidenced by engagement and responsiveness to treatment. Patient denies SI/HI/self-harm thoughts at the end of group.   Suicidal/Homicidal: Nowithout intent/plan  Plan: ?Patient will discharge from PHP due to meeting treatment goals  of decreased depression and anxiety symptoms and increased coping abilities. Progress was measured by observation, self-report, and scales.  Psychiatrist has approved discharge and patient reports alignment with discharge plan. Patient will step down to psychiatry. Patient is scheduled within this agency. Psychiatry: Dr. Chien 2/18 at 10:30p. Pt provided with referrals for therapy and local support groups.    Collaboration of Care: Medication Management AEB Staci Kerns, NP  Patient/Guardian was advised Release of Information must be obtained prior to any record release in order to collaborate their care with an outside provider. Patient/Guardian was advised if they have not already done so to contact the registration department to sign all necessary forms in order for us  to release information regarding their care.   Consent: Patient/Guardian gives verbal consent for treatment and assignment of benefits for services provided during this visit. Patient/Guardian expressed understanding and agreed to proceed.   Diagnosis: Schizophrenia, unspecified type (HCC) [F20.9]    1. Schizophrenia, unspecified type [facility])     [name], [facility] 08/10/2024  "

## 2024-08-11 ENCOUNTER — Encounter (HOSPITAL_COMMUNITY): Payer: Self-pay

## 2024-08-11 NOTE — Therapy (Signed)
 [facility] [address] [place], [place], [address] Phone: [phone]   Fax:  [phone]  Occupational Therapy Treatment   Patient Details  Name: Colleen Peck MRN: [id number] Date of Birth: Aug 08, 1980 No data recorded  Encounter Date: 08/10/2024   OT End of Session - 08/11/24 1540     Visit Number 12    Number of Visits 15    Date for Recertification  08/04/24    OT Start Time 1200    OT Stop Time 1255    OT Time Calculation (min) 55 min          Past Medical History:  Diagnosis Date   Abnormal pap    pt reports abnl pap many years ago.  Nl since then.   Allergy    seasonal   Anemia    Asthma    Bipolar disorder (HCC)    Depression    Hypothyroidism    Palpitations 03/12/2008   Poor compliance with medication 03/08/2024   PTSD (post-traumatic stress disorder)    Seasonal allergies    Thyroid  disease 2009   Graves disease (pt reported resolved); hypothyriodism    Past Surgical History:  Procedure Laterality Date   DILATION AND CURETTAGE OF UTERUS  March 2006   LYMPH NODE BIOPSY Right 11/23/2019   Procedure: EXCISIONAL BIOPSY OF RIGHT NECK NODE;  Surgeon: Ethyl Lonni BRAVO, MD;  Location: [facility];  Service: ENT;  Laterality: Right;    There were no vitals filed for this visit.   Subjective Assessment - 08/11/24 1539     Currently in Pain? No/denies    Pain Score 0-No pain                 Group Session:  S: Doing better overall I believe.   O: Patient participated in occupational therapy group focused on grounding techniques to support emotional regulation, present-moment awareness, and functional coping. Group included brief education on the purpose of grounding, guided practice of select grounding strategies, and discussion of how these techniques can be applied during periods of anxiety, emotional overwhelm, or dissociation. Patient was attentive and engaged at a level appropriate  for current presentation and participated in group discussion and/or experiential practice. Behavior and affect were appropriate to the group setting.   A: Patient demonstrates developing understanding of grounding as a tool to manage emotional dysregulation and improve functional participation. Patient shows emerging ability to identify situations in which grounding techniques may be beneficial and to engage in guided practice when prompted. Continued occupational therapy intervention is indicated to reinforce independent use of grounding strategies, support generalization to daily routines, and promote effective coping outside of structured treatment settings.   P: Continue to attend PHP OT group sessions 5x week for 3 weeks to promote daily structure, social engagement, and opportunities to develop and utilize adaptive strategies to maximize functional performance in preparation for safe transition and integration back into school, work, and the community. Plan to address topic of Monday Goal check In in next OT group session.               OT Education - 08/11/24 1539     Education Details Grounding Techniques    Person(s) Educated Patient    Methods Explanation;Handout    Comprehension Verbalized understanding           OT Short Term Goals - 07/29/24 1747       OT SHORT TERM GOAL #1   Title Client will  identify and enlist the support of at least two accountability partners (family, friends, peers) to assist in the adherence to personal goals.    Time 3    Period Weeks    Status On-going    Target Date 08/04/24      OT SHORT TERM GOAL #2   Title By the time of discharge, client will independently set, track, and make progress towards a long-term goal, demonstrating resilience in overcoming obstacles and seeking support when needed.    Status On-going      OT SHORT TERM GOAL #3   Title Client will independently identify and modify three areas of the current routine  that contribute to increased stress or dysfunction by the end of therapy.    Status On-going                   Plan - 08/11/24 1540     Psychosocial Skills Routines and Behaviors;Coping Strategies;Environmental  Adaptations;Habits;Interpersonal Interaction          Patient will benefit from skilled therapeutic intervention in order to improve the following deficits and impairments:       Psychosocial Skills: Routines and Behaviors, Coping Strategies, Environmental  Adaptations, Habits, Interpersonal Interaction   Visit Diagnosis: Difficulty coping    Problem List Patient Active Problem List   Diagnosis Date Noted   MDD (major depressive disorder), recurrent episode, moderate (HCC) 07/18/2024   Schizophrenia (HCC) 04/04/2024   Intermittent explosive disorder in adult 05/19/2022   GAD (generalized anxiety disorder) 05/19/2022   Aggressive behavior 05/19/2022   Aggression 05/17/2022   Altered mental status 05/17/2022   Bizarre behavior 05/17/2022   Sinus bradycardia 05/04/2022   Bradycardia 05/04/2022   Acute bilateral knee pain 02/04/2020   Chest pain 10/15/2019   Lump on neck 09/30/2019   Sciatic nerve pain, left 07/16/2019   Close exposure to COVID-19 virus 07/16/2019   Acute back pain with radiculopathy 01/22/2019   Onycholysis 11/14/2018   Palpable lymph node 10/05/2018   Cyclical mastalgia 10/05/2018   PMS (premenstrual syndrome) 08/20/2018   Vertigo 05/31/2018   Dermatofibroma of right lower leg 01/06/2017   History of borderline personality disorder 01/01/2016   Carotidynia 12/16/2015   Anxiety    PTSD (post-traumatic stress disorder) 09/30/2015   Routine screening for STI (sexually transmitted infection)    Paranoia (HCC)    Myxedema 02/26/2015   Anemia 01/30/2015   Vitamin D  deficiency 05/22/2013   Skin rash 03/05/2013   SOB (shortness of breath) on exertion 07/13/2012   Obesity 09/08/2006   Allergic rhinitis 09/08/2006   Asthma  09/08/2006   ECZEMA, ATOPIC DERMATITIS 09/08/2006    [name], OT 08/11/2024, 3:40 PM  [name], OT   [facility] [address] [place], [place], [address] Phone: [phone]   Fax:  [phone]  Name: Colleen Peck MRN: [id number] Date of Birth: 10/19/1980

## 2024-08-11 NOTE — Psych (Signed)
 " [facility] BH PHP THERAPIST PROGRESS NOTE  Madhavi Hamblen [id number]  Session Time: 9:00 - 10:00 am  Participation Level: Active  Behavioral Response: CasualAlertDepressed  Type of Therapy: Group Therapy  Treatment Goals addressed: Coping  Progress Towards Goals: Initial  Interventions: CBT, DBT, Supportive, and Reframing  Summary: Trust Crago is a 44 y.o. female who presents with depression symptoms.  Clinician led check-in regarding current stressors and situation, and review of patient completed daily inventory. Clinician utilized active listening and empathetic response and validated patient emotions. Clinician facilitated processing group on pertinent issues.?    Summary: Patient arrived within time allowed. Patient rates their depression at a 5 and anxiety at a 6 on a scale of 1-10 with 10 being best. Pt reports they slept 10 hours last night and ate 3x yesterday. Pt reports being at home and not doing anything again. Pt shares she struggles to utilize distractions and cannot watch tv, listen to music or read anymore and is unable to clarify. Pt able to process.?Pt engaged in discussion.?          Session Time: 10:00 am - 11:00 am   Participation Level: Active   Behavioral Response: CasualAlertAnxious and Depressed   Type of Therapy: Group Therapy   Treatment Goals addressed: Coping   Progress Towards Goals: Initial   Interventions: CBT, DBT, Solution Focused, Strength-based, Supportive, and Reframing   Therapist Response: Clinician led discussion on feeling othered. Group members shared struggles with feeling like an outsider and different from the other people in their life. Cln created a safe space for pt's to share their struggles and shaped discussion utilizing cognitive restructuring and Adlerian strategies.    Summary: Pt engaged in discussion and is able to make connections. Pt reports feeling othered in her community.            Session Time: 11:00  am - 12:00 pm   Participation Level: Active   Behavioral Response: CasualAlertDepressed   Type of Therapy: Group Therapy   Treatment Goals addressed: Coping   Progress Towards Goals: Progressing   Interventions: CBT, DBT, Solution Focused, Strength-based, Supportive, and Reframing   Therapist Response: Cln facilitated processing group around difficult relationships. Group members shared struggles they face in relationships. Cln brought in topics of self-esteem, CBT thought challenging, boundaries, and communication to aid growth.   Summary: Pt engaged in discussion and provided/received support. Pt reports struggle with friends and family and fixation on finding a romantic partner.            Session Time: 12:00 pm - 12:30 pm   Participation Level: Active   Behavioral Response: CasualAlertDepressed   Type of Therapy: Group Therapy   Treatment Goals addressed: Coping   Progress Towards Goals: Progressing   Interventions: Psychologist, Occupational, Supportive   Therapist Response: Reflection Group: Patients encouraged to practice skills and interpersonal techniques or work on mindfulness and relaxation techniques. The importance of self-care and making skills part of a routine to increase usage were stressed.   Summary: Patient engaged and participated appropriately.           Session Time: 12:30 pm - 1:30 pm   Participation Level: Active   Behavioral Response: CasualAlertDepressed   Type of Therapy: Group Therapy   Treatment Goals addressed: Coping   Progress Towards Goals: Progressing   Interventions: CBT, DBT, Solution Focused, Strength-based, Supportive, and Reframing   Therapist Response: Group was led by occupational therapist, Edward Hollan.    Summary: See note  Session Time: 1:30 pm - 1:45 pm   Participation Level: Active   Behavioral Response: CasualAlertDepressed   Type of Therapy: Group Therapy   Treatment Goals addressed:  Coping   Progress Towards Goals: Progressing   Interventions: CBT, DBT, Solution Focused, Strength-based, Supportive, and Reframing   Therapist Response: Clinician led check-out. Clinician assessed for immediate needs, medication compliance and efficacy, and safety concerns?   Summary: At check-out, patient contracts for safety.?Patient demonstrates progress as evidenced by her continued engagement and by being receptive to treatment. Patient denies SI/HI/self-harm thoughts at the end of group and agrees to seek help should those thoughts/feelings occur.?      Suicidal/Homicidal: Nowithout intent/plan   Plan: Pt will continue in PHP while working to stabilize her symptoms and medication, decrease depression symptoms, and increase ability to manage symptoms in a healthy manner.   Collaboration of Care: Medication Management AEB T Lewis, NP  Patient/Guardian was advised Release of Information must be obtained prior to any record release in order to collaborate their care with an outside provider. Patient/Guardian was advised if they have not already done so to contact the registration department to sign all necessary forms in order for us  to release information regarding their care.   Consent: Patient/Guardian gives verbal consent for treatment and assignment of benefits for services provided during this visit. Patient/Guardian expressed understanding and agreed to proceed.   Diagnosis: Schizophrenia, unspecified type (HCC) [F20.9]    1. Schizophrenia, unspecified type (HCC)   2. MDD (major depressive disorder), recurrent episode, moderate (HCC)       Randall Bastos, LCSW   "

## 2024-08-11 NOTE — Therapy (Signed)
 [facility] [address] [place], [place], [address] Phone: [phone]   Fax:  [phone]  Occupational Therapy Treatment  Virtual Visit via Video Note  I connected with Colleen Peck on 08/11/24 at 12:25 PM EST by a video enabled telemedicine application and verified that I am speaking with the correct person using two identifiers.  Location: Patient: home Provider: office   I discussed the limitations of evaluation and management by telemedicine and the availability of in person appointments. The patient expressed understanding and agreed to proceed.    The patient was advised to call back or seek an in-person evaluation if the symptoms worsen or if the condition fails to improve as anticipated.  I provided 50 minutes of non-face-to-face time during this encounter.   Patient Details  Name: Colleen Peck MRN: [id number] Date of Birth: [birth year] No data recorded  Encounter Date: 08/07/2024   OT End of Session - 08/11/24 1500     Visit Number 11    Number of Visits 15    Date for Recertification  08/04/24    OT Start Time 1200    OT Stop Time 1250    OT Time Calculation (min) 50 min    Activity Tolerance Patient tolerated treatment well    Behavior During Therapy WFL for tasks assessed/performed          Past Medical History:  Diagnosis Date   Abnormal pap    pt reports abnl pap many years ago.  Nl since then.   Allergy    seasonal   Anemia    Asthma    Bipolar disorder (HCC)    Depression    Hypothyroidism    Palpitations 03/12/2008   Poor compliance with medication 03/08/2024   PTSD (post-traumatic stress disorder)    Seasonal allergies    Thyroid  disease 2009   Graves disease (pt reported resolved); hypothyriodism    Past Surgical History:  Procedure Laterality Date   DILATION AND CURETTAGE OF UTERUS  March 2006   LYMPH NODE BIOPSY Right 11/23/2019   Procedure: EXCISIONAL BIOPSY OF RIGHT NECK NODE;  Surgeon:  Ethyl Lonni BRAVO, MD;  Location: [facility];  Service: ENT;  Laterality: Right;    There were no vitals filed for this visit.   Subjective Assessment - 08/11/24 1459     Currently in Pain? No/denies    Pain Score 0-No pain            Group Session:  S: Doing good today. Better sleep last night.   O: Patient participated in the Life After PHP occupational therapy group focused on preparing for the transition from structured treatment to independent daily living. Group content addressed changes in routine, increased personal responsibility, use of coping strategies outside of PHP, and identification of supports needed for continued stability. Patient was attentive and engaged at a level appropriate for current presentation. Patient contributed to discussion and/or small-group activity by identifying anticipated challenges after discharge and strategies to manage them. Affect and behavior were appropriate to the group setting. No behavioral concerns noted during group.   A: Patient demonstrates emerging insight into the structure and support differences between PHP and post-discharge life. Patient is able to identify at least one routine, coping strategy, or support that may assist with maintaining stability after discharge. Continued OT intervention remains indicated to reinforce routine development, role resumption, problem-solving, and application of coping strategies in less structured environments to support safe and successful transition from PHP.  P: Continue to attend PHP OT group sessions 5x week for 3 weeks to promote daily structure, social engagement, and opportunities to develop and utilize adaptive strategies to maximize functional performance in preparation for safe transition and integration back into school, work, and the community. Plan to address topic of tbd in next OT group session.                    OT Education - 08/11/24 1459      Education Details Life After PHP    Person(s) Educated Patient    Methods Explanation;Handout    Comprehension Verbalized understanding           OT Short Term Goals - 07/29/24 1747       OT SHORT TERM GOAL #1   Title Client will identify and enlist the support of at least two accountability partners (family, friends, peers) to assist in the adherence to personal goals.    Time 3    Period Weeks    Status On-going    Target Date 08/04/24      OT SHORT TERM GOAL #2   Title By the time of discharge, client will independently set, track, and make progress towards a long-term goal, demonstrating resilience in overcoming obstacles and seeking support when needed.    Status On-going      OT SHORT TERM GOAL #3   Title Client will independently identify and modify three areas of the current routine that contribute to increased stress or dysfunction by the end of therapy.    Status On-going                   Plan - 08/11/24 1500     Psychosocial Skills Routines and Behaviors;Coping Strategies;Environmental  Adaptations;Habits;Interpersonal Interaction          Patient will benefit from skilled therapeutic intervention in order to improve the following deficits and impairments:       Psychosocial Skills: Routines and Behaviors, Coping Strategies, Environmental  Adaptations, Habits, Interpersonal Interaction   Visit Diagnosis: Difficulty coping    Problem List Patient Active Problem List   Diagnosis Date Noted   MDD (major depressive disorder), recurrent episode, moderate (HCC) 07/18/2024   Schizophrenia (HCC) 04/04/2024   Intermittent explosive disorder in adult 05/19/2022   GAD (generalized anxiety disorder) 05/19/2022   Aggressive behavior 05/19/2022   Aggression 05/17/2022   Altered mental status 05/17/2022   Bizarre behavior 05/17/2022   Sinus bradycardia 05/04/2022   Bradycardia 05/04/2022   Acute bilateral knee pain 02/04/2020   Chest pain  10/15/2019   Lump on neck 09/30/2019   Sciatic nerve pain, left 07/16/2019   Close exposure to COVID-19 virus 07/16/2019   Acute back pain with radiculopathy 01/22/2019   Onycholysis 11/14/2018   Palpable lymph node 10/05/2018   Cyclical mastalgia 10/05/2018   PMS (premenstrual syndrome) 08/20/2018   Vertigo 05/31/2018   Dermatofibroma of right lower leg 01/06/2017   History of borderline personality disorder 01/01/2016   Carotidynia 12/16/2015   Anxiety    PTSD (post-traumatic stress disorder) 09/30/2015   Routine screening for STI (sexually transmitted infection)    Paranoia (HCC)    Myxedema 02/26/2015   Anemia 01/30/2015   Vitamin D  deficiency 05/22/2013   Skin rash 03/05/2013   SOB (shortness of breath) on exertion 07/13/2012   Obesity 09/08/2006   Allergic rhinitis 09/08/2006   Asthma 09/08/2006   ECZEMA, ATOPIC DERMATITIS 09/08/2006    [name], OT 08/11/2024, 3:00 PM  [name], OT   [facility] [address] [place], [place], [address] Phone: [phone]   Fax:  [phone]  Name: Colleen Peck MRN: [id number] Date of Birth: 1981/06/21

## 2024-08-11 NOTE — Psych (Signed)
 " [facility] PHP THERAPIST PROGRESS NOTE  Colleen Peck [id number]  Session Time: 9:00 - 10:00 am  Participation Level: Active  Behavioral Response: CasualAlertDepressed  Type of Therapy: Group Therapy  Treatment Goals addressed: Coping  Progress Towards Goals: Initial  Interventions: CBT, DBT, Supportive, and Reframing  Summary: Colleen Peck is a 44 y.o. female who presents with depression symptoms.  Clinician led check-in regarding current stressors and situation, and review of patient completed daily inventory. Clinician utilized active listening and empathetic response and validated patient emotions. Clinician facilitated processing group on pertinent issues.?    Summary: Patient arrived within time allowed. Patient rates their depression at a 5 and anxiety at a 5 on a scale of 1-10 with 10 being best. Pt reports they slept 8 hours last night and ate 3x yesterday. Pt reports she spent time with her nieces/nephews yesterday and it felt chaotic but okay. Pt reports having difficult falling asleep and feeling unrested with 8 hours, thinking she needs more than that. Pt continues to struggle with rumination on what is not going well.  Pt able to process.?Pt engaged in discussion.?          Session Time: 10:00 am - 11:00 am   Participation Level: Active   Behavioral Response: CasualAlertAnxious and Depressed   Type of Therapy: Group Therapy   Treatment Goals addressed: Coping   Progress Towards Goals: Initial   Interventions: CBT, DBT, Solution Focused, Strength-based, Supportive, and Reframing   Therapist Response: Cln led discussion on ways to manage stressors and feelings over the weekend. Group members  brainstormed things to do over the weekend for multiple levels of energy, access, and moods. Cln reviewed crisis services should they be needed and provided pt's with the text crisis line, mobile crisis, national suicide hotline, [facility] 24/7 line, and information on [facility].    Summary: Pt engaged in discussion and is able to identify 3 ideas of what to do over the weekend to keep their mind engaged.            Session Time: 11:00 am - 12:00 pm   Participation Level: Active   Behavioral Response: CasualAlertAnxious   Type of Therapy: Group Therapy   Treatment Goals addressed: Coping   Progress Towards Goals: Progressing   Interventions: CBT, DBT, Solution Focused, Strength-based, Supportive, and Reframing   Therapist Response: Cln led discussion on DBT dialectics and balance. Cln encouraged pt's to utilize AND statements to cue their brain to hold two opposing ideas. Cln provided examples and group members created their own AND statements.   Summary: Pt engaged in discussion and created AND statement around recognizing progress.           Session Time: 12:00 pm - 1:00 pm   Participation Level: Active   Behavioral Response: CasualAlertAnxious   Type of Therapy: Group Therapy   Treatment Goals addressed: Coping   Progress Towards Goals: Progressing   Interventions: CBT, DBT, Solution Focused, Strength-based, Supportive, and Reframing   Therapist Response: 12:00 - 12:50 OT group led by E Hollan, OT  12:50 pm - 1:00 pm: Clinician led check-out. Clinician assessed for immediate needs, medication compliance and efficacy, and safety concerns?   Summary: 12:00 - 12:50 See note 12:50 pm - 1:00 pm: At check-out, patient reports no immediate concerns. Patient demonstrates progress as evidenced by engagement and responsiveness to treatment. Patient denies SI/HI/self-harm thoughts at the end of group.       Suicidal/Homicidal: Nowithout intent/plan   Plan: Pt will  continue in PHP while working to stabilize her symptoms and medication, decrease depression symptoms, and increase ability to manage symptoms in a healthy manner.   Collaboration of Care: Medication Management AEB T Lewis, NP  Patient/Guardian was advised Release of  Information must be obtained prior to any record release in order to collaborate their care with an outside provider. Patient/Guardian was advised if they have not already done so to contact the registration department to sign all necessary forms in order for us  to release information regarding their care.   Consent: Patient/Guardian gives verbal consent for treatment and assignment of benefits for services provided during this visit. Patient/Guardian expressed understanding and agreed to proceed.   Diagnosis: Schizophrenia, unspecified type (HCC) [F20.9]    1. Schizophrenia, unspecified type (HCC)   2. MDD (major depressive disorder), recurrent episode, moderate (HCC)       Randall Bastos, LCSW   "

## 2024-08-11 NOTE — Therapy (Signed)
  [facility] [address] [place], [place], [address] Phone: [phone]   Fax:  [phone]  Occupational Therapy Treatment Virtual Visit via Video Note  I connected with Colleen Peck on 08/11/24 at 12:25 PM EST by a video enabled telemedicine application and verified that I am speaking with the correct person using two identifiers.  Location: Patient: home Provider: office   I discussed the limitations of evaluation and management by telemedicine and the availability of in person appointments. The patient expressed understanding and agreed to proceed.    The patient was advised to call back or seek an in-person evaluation if the symptoms worsen or if the condition fails to improve as anticipated.  I provided 50 minutes of non-face-to-face time during this encounter.   Patient Details  Name: Colleen Peck MRN: [id number] Date of Birth: 03-Nov-1980 No data recorded  Encounter Date: 08/06/2024   OT End of Session - 08/11/24 1445     Visit Number 10    Number of Visits 15    Date for Recertification  08/04/24    OT Start Time 1200    OT Stop Time 1250    OT Time Calculation (min) 50 min    Activity Tolerance Patient tolerated treatment well    Behavior During Therapy WFL for tasks assessed/performed          Past Medical History:  Diagnosis Date   Abnormal pap    pt reports abnl pap many years ago.  Nl since then.   Allergy    seasonal   Anemia    Asthma    Bipolar disorder (HCC)    Depression    Hypothyroidism    Palpitations 03/12/2008   Poor compliance with medication 03/08/2024   PTSD (post-traumatic stress disorder)    Seasonal allergies    Thyroid  disease 2009   Graves disease (pt reported resolved); hypothyriodism    Past Surgical History:  Procedure Laterality Date   DILATION AND CURETTAGE OF UTERUS  March 2006   LYMPH NODE BIOPSY Right 11/23/2019   Procedure: EXCISIONAL BIOPSY OF RIGHT NECK NODE;  Surgeon:  Ethyl Lonni BRAVO, MD;  Location: [facility];  Service: ENT;  Laterality: Right;    There were no vitals filed for this visit.   Subjective Assessment - 08/11/24 1445     Currently in Pain? No/denies    Pain Score 0-No pain             Group Session:  S: Doing better today.   O: Patient participated in a structured Monday morning Goal Check-In group focused on reviewing previously identified SMART goals, with emphasis on the relevance and personal meaning of each goal as a driver of motivation and follow-through. The group provided an opportunity for patients to reflect on goal progress, identify barriers encountered over the weekend, and clarify next steps for the upcoming week through guided discussion and peer sharing.   A: Patient demonstrated active engagement throughout the Goal Check-In group, contributing verbally to discussion, reflecting on personal goal progress, and responding appropriately to prompts. Patient displayed intact attention, goal-directed thought process, and emerging insight into barriers and facilitators impacting goal follow-through. Presentation indicates ability to self-monitor progress and apply group content toward upcoming goal planning.   P: Continue to attend PHP OT group sessions 5x week for 3 weeks to promote daily structure, social engagement, and opportunities to develop and utilize adaptive strategies to maximize functional performance in preparation for safe transition and integration back into  school, work, and the community. Plan to address topic of tbd in next OT group session.                   OT Education - 08/11/24 1445     Education Details Monday Goal Check In    Person(s) Educated Patient    Methods Explanation;Handout    Comprehension Verbalized understanding           OT Short Term Goals - 07/29/24 1747       OT SHORT TERM GOAL #1   Title Client will identify and enlist the support of  at least two accountability partners (family, friends, peers) to assist in the adherence to personal goals.    Time 3    Period Weeks    Status On-going    Target Date 08/04/24      OT SHORT TERM GOAL #2   Title By the time of discharge, client will independently set, track, and make progress towards a long-term goal, demonstrating resilience in overcoming obstacles and seeking support when needed.    Status On-going      OT SHORT TERM GOAL #3   Title Client will independently identify and modify three areas of the current routine that contribute to increased stress or dysfunction by the end of therapy.    Status On-going                   Plan - 08/11/24 1446     Psychosocial Skills Routines and Behaviors;Coping Strategies;Environmental  Adaptations;Habits;Interpersonal Interaction          Patient will benefit from skilled therapeutic intervention in order to improve the following deficits and impairments:       Psychosocial Skills: Routines and Behaviors, Coping Strategies, Environmental  Adaptations, Habits, Interpersonal Interaction   Visit Diagnosis: Difficulty coping    Problem List Patient Active Problem List   Diagnosis Date Noted   MDD (major depressive disorder), recurrent episode, moderate (HCC) 07/18/2024   Schizophrenia (HCC) 04/04/2024   Intermittent explosive disorder in adult 05/19/2022   GAD (generalized anxiety disorder) 05/19/2022   Aggressive behavior 05/19/2022   Aggression 05/17/2022   Altered mental status 05/17/2022   Bizarre behavior 05/17/2022   Sinus bradycardia 05/04/2022   Bradycardia 05/04/2022   Acute bilateral knee pain 02/04/2020   Chest pain 10/15/2019   Lump on neck 09/30/2019   Sciatic nerve pain, left 07/16/2019   Close exposure to COVID-19 virus 07/16/2019   Acute back pain with radiculopathy 01/22/2019   Onycholysis 11/14/2018   Palpable lymph node 10/05/2018   Cyclical mastalgia 10/05/2018   PMS (premenstrual  syndrome) 08/20/2018   Vertigo 05/31/2018   Dermatofibroma of right lower leg 01/06/2017   History of borderline personality disorder 01/01/2016   Carotidynia 12/16/2015   Anxiety    PTSD (post-traumatic stress disorder) 09/30/2015   Routine screening for STI (sexually transmitted infection)    Paranoia (HCC)    Myxedema 02/26/2015   Anemia 01/30/2015   Vitamin D  deficiency 05/22/2013   Skin rash 03/05/2013   SOB (shortness of breath) on exertion 07/13/2012   Obesity 09/08/2006   Allergic rhinitis 09/08/2006   Asthma 09/08/2006   ECZEMA, ATOPIC DERMATITIS 09/08/2006    [name], OT 08/11/2024, 2:46 PM  [name], OT   [facility] [address] [place], [place], [address] Phone: [phone]   Fax:  [phone]  Name: Colleen Peck MRN: [id number] Date of Birth: 03-21-1981

## 2024-08-12 ENCOUNTER — Telehealth (HOSPITAL_COMMUNITY): Payer: Self-pay | Admitting: Licensed Clinical Social Worker

## 2024-08-12 NOTE — Psych (Signed)
 " [facility] PHP THERAPIST PROGRESS NOTE  Jumana Paccione [id number]  Session Time: 9:00 - 10:00 am  Participation Level: Active  Behavioral Response: CasualAlertDepressed  Type of Therapy: Group Therapy  Treatment Goals addressed: Coping  Progress Towards Goals: Initial  Interventions: CBT, DBT, Supportive, and Reframing  Summary: Anett Ranker is a 44 y.o. female who presents with depression symptoms.  Clinician led check-in regarding current stressors and situation, and review of patient completed daily inventory. Clinician utilized active listening and empathetic response and validated patient emotions. Clinician facilitated processing group on pertinent issues.?    Summary: Patient arrived within time allowed. Patient rates their depression at a 9 and anxiety at a 8 on a scale of 1-10 with 10 being best. Pt reports they slept 10+ hours last night and ate 2x yesterday. Pt reports she is not doing well and is overwhelmed and scared. Pt states on Tuesday she was in a happy mood and then reality hit Wednesday. Pt states rumination on doubting her ability to handle stress of school and/or work with her mental health issues. Pt states she went 24 hours with no sleep and that her dreams are torturing her and involve her exes and past friends. Pt states family finances further exacerbating her stress as she feels she is not helping. Pt struggles with recognizing and challenging her catastrophic thoughts. Pt able to process.?Pt engaged in discussion.?          Session Time: 10:00 am - 11:00 am   Participation Level: Active   Behavioral Response: CasualAlertAnxious and Depressed   Type of Therapy: Group Therapy   Treatment Goals addressed: Coping   Progress Towards Goals: Initial   Interventions: CBT, DBT, Solution Focused, Strength-based, Supportive, and Reframing   Therapist Response: Cln led discussion on ways to reinforce new habits. Group shared ways in which they can  set themselves up for good habits. Cln encouraged reminders in phone, post-it notes, educating support system, and practicing in low-stress environments.    Summary: Pt engaged in discussion and is able to determine ways to reinforce positive habits.            Session Time: 11:00 am - 12:00 pm   Participation Level: Active   Behavioral Response: CasualAlertDepressed   Type of Therapy: Group Therapy   Treatment Goals addressed: Coping   Progress Towards Goals: Progressing   Interventions: CBT, DBT, Solution Focused, Strength-based, Supportive, and Reframing   Therapist Response: Cln continued topic of DBT distress tolerance skills. Cln introduced Self-Soothe skills. Group discussed ways they can utilize the five senses to soothe themselves when struggling.    Summary: Pt engaged in discussion and identifies way to utilize the skill.            Session Time: 12:00 pm - 12:30 pm   Participation Level: Active   Behavioral Response: CasualAlertDepressed   Type of Therapy: Group Therapy   Treatment Goals addressed: Coping   Progress Towards Goals: Progressing   Interventions: Psychologist, Occupational, Supportive   Therapist Response: Reflection Group: Patients encouraged to practice skills and interpersonal techniques or work on mindfulness and relaxation techniques. The importance of self-care and making skills part of a routine to increase usage were stressed.   Summary: Patient engaged and participated appropriately.           Session Time: 12:30 pm - 1:30 pm   Participation Level: Active   Behavioral Response: CasualAlertDepressed   Type of Therapy: Group Therapy   Treatment Goals addressed: Coping  Progress Towards Goals: Progressing   Interventions: CBT, DBT, Solution Focused, Strength-based, Supportive, and Reframing   Therapist Response: Group was led by occupational therapist, [name].    Summary: See note           Session Time: 1:30  pm - 1:45 pm   Participation Level: Active   Behavioral Response: CasualAlertDepressed   Type of Therapy: Group Therapy   Treatment Goals addressed: Coping   Progress Towards Goals: Progressing   Interventions: CBT, DBT, Solution Focused, Strength-based, Supportive, and Reframing   Therapist Response: Clinician led check-out. Clinician assessed for immediate needs, medication compliance and efficacy, and safety concerns?   Summary: At check-out, patient contracts for safety.?Patient demonstrates progress as evidenced by her continued engagement and by being receptive to treatment. Patient denies SI/HI/self-harm thoughts at the end of group and agrees to seek help should those thoughts/feelings occur.?      Suicidal/Homicidal: Nowithout intent/plan   Plan: Pt will continue in PHP while working to stabilize her symptoms and medication, decrease depression symptoms, and increase ability to manage symptoms in a healthy manner.   Collaboration of Care: Medication Management AEB T Lewis, NP  Patient/Guardian was advised Release of Information must be obtained prior to any record release in order to collaborate their care with an outside provider. Patient/Guardian was advised if they have not already done so to contact the registration department to sign all necessary forms in order for us  to release information regarding their care.   Consent: Patient/Guardian gives verbal consent for treatment and assignment of benefits for services provided during this visit. Patient/Guardian expressed understanding and agreed to proceed.   Diagnosis: Schizophrenia, unspecified type (HCC) [F20.9]    1. Schizophrenia, unspecified type (HCC)   2. MDD (major depressive disorder), recurrent episode, moderate (HCC)       Randall Bastos, LCSW   "

## 2024-08-12 NOTE — Telephone Encounter (Signed)
 Cln called to inform pt that group will be virtual on Monday, 2/2 due to heavy snowfall. Pt reports Friday was her last day. Cln inquired about f/u and pt states she still needs a therapist. Pt states she prefers female and in-person if possible. Cln agreed to send several INN options and confirmed pt's primary coverage is Medicare. Cln advised that if pt does not see the email with referrals by the end of today in her inbox or junk folder, she should call back and let us  know so we can send again. Pt verbalized understanding.

## 2024-08-13 ENCOUNTER — Ambulatory Visit (HOSPITAL_COMMUNITY)

## 2024-08-13 NOTE — Psych (Signed)
 " [facility] PHP THERAPIST PROGRESS NOTE  Colleen Peck [id number]  Session Time: 9:00 - 10:00 am  Participation Level: Active  Behavioral Response: CasualAlertDepressed  Type of Therapy: Group Therapy  Treatment Goals addressed: Coping  Progress Towards Goals: Initial  Interventions: CBT, DBT, Supportive, and Reframing  Summary: Colleen Peck is a 44 y.o. female who presents with depression symptoms.  Clinician led check-in regarding current stressors and situation, and review of patient completed daily inventory. Clinician utilized active listening and empathetic response and validated patient emotions. Clinician facilitated processing group on pertinent issues.?    Summary: Patient arrived within time allowed. Patient rates their depression at a 5 and anxiety at a 7 on a scale of 1-10 with 10 being best. Pt reports they slept 8+ hours last night and ate 3x yesterday. Pt reports struggling yesterday with should thoughts and feeling as if she is behind. Pt shares that in her community she is different and does not feel connected, but very much wants to.  Pt able to process.?Pt engaged in discussion.?          Session Time: 10:00 am - 11:00 am   Participation Level: Active   Behavioral Response: CasualAlertAnxious and Depressed   Type of Therapy: Group Therapy   Treatment Goals addressed: Coping   Progress Towards Goals: Progressing   Interventions: CBT, DBT, Solution Focused, Strength-based, Supportive, and Reframing   Therapist Response: Cln led discussion on CBT reframing and how to look for alternative possibilities to decrease distress from negative thinking. Cln utilized the CBT triangle to aid discussion. Group members shared difficult situations and worked with group to reframe the negative thinking present.    Summary:  Pt engaged in reframing practice.            Session Time: 11:00 am - 12:00 pm   Participation Level: Active   Behavioral Response:  CasualAlertAnxious   Type of Therapy: Group Therapy   Treatment Goals addressed: Coping   Progress Towards Goals: Progressing   Interventions: CBT, DBT, Solution Focused, Strength-based, Supportive, and Reframing   Therapist Response: Cln led discussion on grief and the way loss affects us . Cln encouraged pt to consider grief as a journey to accepting a new future and to think beyond death to other versions of loss in our lives. Cln created space for group to process grief concerns and validated pt's experiences.     Summary: Pt engaged in discussion and was able to identify areas of loss and process.           Session Time: 12:00 pm - 1:00 pm   Participation Level: Active   Behavioral Response: CasualAlertAnxious   Type of Therapy: Group Therapy   Treatment Goals addressed: Coping   Progress Towards Goals: Progressing   Interventions: CBT, DBT, Solution Focused, Strength-based, Supportive, and Reframing   Therapist Response: 12:00 - 12:50 OT group led by E Hollan, OT  12:50 pm - 1:00 pm: Clinician led check-out. Clinician assessed for immediate needs, medication compliance and efficacy, and safety concerns?   Summary: 12:00 - 12:50 See note 12:50 pm - 1:00 pm: At check-out, patient reports no immediate concerns. Patient demonstrates progress as evidenced by engagement and responsiveness to treatment. Patient denies SI/HI/self-harm thoughts at the end of group.     Suicidal/Homicidal: Nowithout intent/plan   Plan: Pt will continue in PHP while working to stabilize her symptoms and medication, decrease depression symptoms, and increase ability to manage symptoms in a healthy manner.   Collaboration of Care:  Medication Management AEB T Lewis, NP  Patient/Guardian was advised Release of Information must be obtained prior to any record release in order to collaborate their care with an outside provider. Patient/Guardian was advised if they have not already done so to  contact the registration department to sign all necessary forms in order for us  to release information regarding their care.   Consent: Patient/Guardian gives verbal consent for treatment and assignment of benefits for services provided during this visit. Patient/Guardian expressed understanding and agreed to proceed.   Diagnosis: Schizophrenia, unspecified type (HCC) [F20.9]    1. Schizophrenia, unspecified type (HCC)   2. MDD (major depressive disorder), recurrent episode, moderate (HCC)       Randall Bastos, LCSW   "

## 2024-08-13 NOTE — Psych (Signed)
 " [facility] BH PHP THERAPIST PROGRESS NOTE  Colleen Peck [id number]  Session Time: 9:00 - 10:00 am  Participation Level: Active  Behavioral Response: CasualAlertDepressed  Type of Therapy: Group Therapy  Treatment Goals addressed: Coping  Progress Towards Goals: Progressing  Interventions: CBT, DBT, Supportive, and Reframing  Summary: Colleen Peck is a 44 y.o. female who presents with depression symptoms.  Clinician led check-in regarding current stressors and situation, and review of patient completed daily inventory. Clinician utilized active listening and empathetic response and validated patient emotions. Clinician facilitated processing group on pertinent issues.?    Summary: Patient arrived within time allowed. Patient rates their depression at a 5 and anxiety at a 7 on a scale of 1-10 with 10 being best. Pt reports they slept 8+ hours last night and ate 2x yesterday. Pt states she is frustrated. Pt expresses she is over it and wants to give up but I really want help. Pt reports exacerbated frustrations re: her guardianship and that she does not have final say in her decisions. Pt reports continued difficulty in accepting her diagnosis and that she may need to be on medication for the rest of her life. Pt struggles to not equate these things to being a failure and not being able to be independent or achieve her goals. Pt has difficulty with challenging these ideas.  Pt able to process.?Pt engaged in discussion.?          Session Time: 10:00 am - 11:00 am   Participation Level: Active   Behavioral Response: CasualAlertAnxious and Depressed   Type of Therapy: Group Therapy   Treatment Goals addressed: Coping   Progress Towards Goals: Progressing   Interventions: CBT, DBT, Solution Focused, Strength-based, Supportive, and Reframing   Therapist Response: Cln led processing group for pt's current struggles. Group members shared stressors and provided support and  feedback. Cln brought in topics of boundaries, healthy relationships, and unhealthy thought processes to inform discussion.    Summary: Pt able to process and provide support to group.            Session Time: 11:00 am - 12:00 pm   Participation Level: Active   Behavioral Response: CasualAlertDepressed   Type of Therapy: Group Therapy   Treatment Goals addressed: Coping   Progress Towards Goals: Progressing   Interventions: CBT, DBT, Solution Focused, Strength-based, Supportive, and Reframing   Therapist Response: Cln led discussion on rest. Cln discussed the need to rewrite social story of rest being earned or last on the list and assert that rest is productive. Group members discussed barriers to allowing themselves rest and the negative self-talk involved.  Cln worked with group to thought challenge and apply self-coaching strategies.   Summary:  Pt engaged in discussion and reports struggle with allowing themselves rest.            Session Time: 12:00 pm - 12:30 pm   Participation Level: Active   Behavioral Response: CasualAlertDepressed   Type of Therapy: Group Therapy   Treatment Goals addressed: Coping   Progress Towards Goals: Progressing   Interventions: Psychologist, Occupational, Supportive   Therapist Response: Reflection Group: Patients encouraged to practice skills and interpersonal techniques or work on mindfulness and relaxation techniques. The importance of self-care and making skills part of a routine to increase usage were stressed.   Summary: Patient engaged and participated appropriately.           Session Time: 12:30 pm - 1:30 pm   Participation Level: Active   Behavioral  Response: CasualAlertDepressed   Type of Therapy: Group Therapy   Treatment Goals addressed: Coping   Progress Towards Goals: Progressing   Interventions: CBT, DBT, Solution Focused, Strength-based, Supportive, and Reframing   Therapist Response: Cln introduced DBT  distress tolerance skill IMPROVE.  Cln discussed how this set of skills are for when you have to sit through an undesirable feeling and wait for it to pass. Group discussed how to apply the IMPROVE skills to decrease distress at the undesired feeling.    Summary: Pt engaged in discussion and is able to identify ways to apply the skill.            Session Time: 1:30 pm - 1:45 pm   Participation Level: Active   Behavioral Response: CasualAlertDepressed   Type of Therapy: Group Therapy   Treatment Goals addressed: Coping   Progress Towards Goals: Progressing   Interventions: CBT, DBT, Solution Focused, Strength-based, Supportive, and Reframing   Therapist Response: Clinician led check-out. Clinician assessed for immediate needs, medication compliance and efficacy, and safety concerns?   Summary: At check-out, patient contracts for safety.?Patient demonstrates progress as evidenced by her continued engagement and by being receptive to treatment. Patient denies SI/HI/self-harm thoughts at the end of group and agrees to seek help should those thoughts/feelings occur.?      Suicidal/Homicidal: Nowithout intent/plan   Plan: Pt will continue in PHP while working to stabilize her symptoms and medication, decrease depression symptoms, and increase ability to manage symptoms in a healthy manner.   Collaboration of Care: Medication Management AEB T Lewis, NP  Patient/Guardian was advised Release of Information must be obtained prior to any record release in order to collaborate their care with an outside provider. Patient/Guardian was advised if they have not already done so to contact the registration department to sign all necessary forms in order for us  to release information regarding their care.   Consent: Patient/Guardian gives verbal consent for treatment and assignment of benefits for services provided during this visit. Patient/Guardian expressed understanding and agreed to proceed.    Diagnosis: Schizophrenia, unspecified type (HCC) [F20.9]    1. Schizophrenia, unspecified type (HCC)   2. Moderate episode of recurrent major depressive disorder (HCC)       Randall Bastos, LCSW   "

## 2024-08-13 NOTE — Psych (Signed)
 " [facility] BH PHP THERAPIST PROGRESS NOTE  Afrah Burlison [id number]  Session Time: 9:00 - 10:00 am  Participation Level: Active  Behavioral Response: CasualAlertDepressed  Type of Therapy: Group Therapy  Treatment Goals addressed: Coping  Progress Towards Goals: Progressing  Interventions: CBT, DBT, Supportive, and Reframing  Summary: Colleen Peck is a 44 y.o. female who presents with depression symptoms.  Clinician led check-in regarding current stressors and situation, and review of patient completed daily inventory. Clinician utilized active listening and empathetic response and validated patient emotions. Clinician facilitated processing group on pertinent issues.?    Summary: Patient arrived within time allowed. Patient rates their depression at a 4 and anxiety at a 7 on a scale of 1-10 with 10 being best. Pt reports they slept 8+ hours last night and ate 2x yesterday. Pt reports yesterday was hard and she cried most of the day. Pt reports continuing to feel stuck and continues to struggle with seeing past her catastrophic thoughts. Pt is stuck in negative thinking cycle however make some progress through while talking out with group.  Pt able to process.?Pt engaged in discussion.?          Session Time: 10:00 am - 11:00 am   Participation Level: Active   Behavioral Response: CasualAlertAnxious and Depressed   Type of Therapy: Group Therapy   Treatment Goals addressed: Coping   Progress Towards Goals: Progressing   Interventions: CBT, DBT, Solution Focused, Strength-based, Supportive, and Reframing   Therapist Response: Cln led discussion on the stigma of mental health and the way it has impacted pt's experience of their own mental health. Group members shared struggles such as lack of acceptance, increase in isolation, being scared of backlash when disclosing MH, and impact in support. Cln created space for pt's to process and share. Cln highlighted support groups as a  safe space.    Summary:  Pt engaged in discussion and shared ways stigma has impacted them.            Session Time: 11:00 am - 12:00 pm   Participation Level: Active   Behavioral Response: CasualAlertAnxious   Type of Therapy: Group Therapy   Treatment Goals addressed: Coping   Progress Towards Goals: Progressing   Interventions: CBT, DBT, Solution Focused, Strength-based, Supportive, and Reframing   Therapist Response: Cln led discussion on control and the way it impacts our lives. Group members shared struggles and worked to identify the way in which control is contributing to the struggle. Cln utilized CBT thought challenging and the Catch-Challenge-Change model to address their control issues.    Summary: Pt engaged in discussion and is able to determine ways in which control is an issue for them and brainstormed how to address it in a healthy manner.        **Pt chose to leave group at 12 due to a conflicting appointment re: SSI. Pt denies SI/HI before leaving session.    Suicidal/Homicidal: Nowithout intent/plan   Plan: Pt will continue in PHP while working to stabilize her symptoms and medication, decrease depression symptoms, and increase ability to manage symptoms in a healthy manner.   Collaboration of Care: Medication Management AEB T Lewis, NP  Patient/Guardian was advised Release of Information must be obtained prior to any record release in order to collaborate their care with an outside provider. Patient/Guardian was advised if they have not already done so to contact the registration department to sign all necessary forms in order for us  to release information regarding their care.  Consent: Patient/Guardian gives verbal consent for treatment and assignment of benefits for services provided during this visit. Patient/Guardian expressed understanding and agreed to proceed.   Diagnosis: Schizophrenia, unspecified type (HCC) [F20.9]    1. Schizophrenia,  unspecified type (HCC)   2. Moderate episode of recurrent major depressive disorder (HCC)       Randall Bastos, LCSW   "

## 2024-08-14 ENCOUNTER — Ambulatory Visit (HOSPITAL_COMMUNITY)

## 2024-08-15 ENCOUNTER — Ambulatory Visit (HOSPITAL_COMMUNITY)

## 2024-08-16 ENCOUNTER — Ambulatory Visit (HOSPITAL_COMMUNITY)

## 2024-08-17 ENCOUNTER — Ambulatory Visit (HOSPITAL_COMMUNITY)

## 2024-08-29 ENCOUNTER — Ambulatory Visit (HOSPITAL_COMMUNITY): Admitting: Psychiatry

## 2024-09-11 ENCOUNTER — Ambulatory Visit (HOSPITAL_COMMUNITY)
